# Patient Record
Sex: Male | Born: 1935
Health system: Southern US, Community
[De-identification: ages and names within clinical notes are randomized; demographics above are authoritative.]

## PROBLEM LIST (undated history)

## (undated) DIAGNOSIS — I10 Essential (primary) hypertension: Secondary | ICD-10-CM

## (undated) DIAGNOSIS — I341 Nonrheumatic mitral (valve) prolapse: Secondary | ICD-10-CM

## (undated) DIAGNOSIS — K648 Other hemorrhoids: Secondary | ICD-10-CM

## (undated) DIAGNOSIS — Z8679 Personal history of other diseases of the circulatory system: Secondary | ICD-10-CM

## (undated) DIAGNOSIS — E039 Hypothyroidism, unspecified: Secondary | ICD-10-CM

## (undated) DIAGNOSIS — J309 Allergic rhinitis, unspecified: Secondary | ICD-10-CM

## (undated) DIAGNOSIS — I2699 Other pulmonary embolism without acute cor pulmonale: Secondary | ICD-10-CM

## (undated) DIAGNOSIS — Z9289 Personal history of other medical treatment: Secondary | ICD-10-CM

## (undated) DIAGNOSIS — I700 Atherosclerosis of aorta: Secondary | ICD-10-CM

## (undated) DIAGNOSIS — H9192 Unspecified hearing loss, left ear: Secondary | ICD-10-CM

## (undated) DIAGNOSIS — N400 Enlarged prostate without lower urinary tract symptoms: Secondary | ICD-10-CM

## (undated) DIAGNOSIS — M21371 Foot drop, right foot: Secondary | ICD-10-CM

## (undated) DIAGNOSIS — H1010 Acute atopic conjunctivitis, unspecified eye: Secondary | ICD-10-CM

## (undated) DIAGNOSIS — Z9889 Other specified postprocedural states: Secondary | ICD-10-CM

## (undated) DIAGNOSIS — K219 Gastro-esophageal reflux disease without esophagitis: Secondary | ICD-10-CM

## (undated) DIAGNOSIS — D509 Iron deficiency anemia, unspecified: Secondary | ICD-10-CM

## (undated) DIAGNOSIS — K5730 Diverticulosis of large intestine without perforation or abscess without bleeding: Secondary | ICD-10-CM

## (undated) DIAGNOSIS — IMO0001 Reserved for inherently not codable concepts without codable children: Secondary | ICD-10-CM

## (undated) DIAGNOSIS — I519 Heart disease, unspecified: Secondary | ICD-10-CM

## (undated) DIAGNOSIS — I711 Thoracic aortic aneurysm, ruptured: Secondary | ICD-10-CM

## (undated) DIAGNOSIS — E079 Disorder of thyroid, unspecified: Secondary | ICD-10-CM

## (undated) HISTORY — DX: Hypothyroidism, unspecified: E03.9

## (undated) HISTORY — PX: TONSILLECTOMY: SUR1361

## (undated) HISTORY — DX: Diverticulosis of large intestine without perforation or abscess without bleeding: K57.30

## (undated) HISTORY — DX: Acute atopic conjunctivitis, unspecified eye: H10.10

## (undated) HISTORY — DX: Other specified postprocedural states: Z98.890

## (undated) HISTORY — DX: Other hemorrhoids: K64.8

## (undated) HISTORY — DX: Allergic rhinitis, unspecified: J30.9

## (undated) HISTORY — DX: Unspecified hearing loss, left ear: H91.92

## (undated) HISTORY — DX: Nonrheumatic mitral (valve) prolapse: I34.1

## (undated) HISTORY — DX: Atherosclerosis of aorta: I70.0

## (undated) HISTORY — DX: Iron deficiency anemia, unspecified: D50.9

## (undated) HISTORY — DX: Personal history of other medical treatment: Z92.89

## (undated) HISTORY — DX: Benign prostatic hyperplasia without lower urinary tract symptoms: N40.0

## (undated) HISTORY — PX: CATARACT EXTRACTION, BILATERAL: SHX1313

## (undated) HISTORY — DX: Gastro-esophageal reflux disease without esophagitis: K21.9

## (undated) HISTORY — DX: Essential (primary) hypertension: I10

## (undated) HISTORY — DX: Foot drop, right foot: M21.371

## (undated) HISTORY — DX: Personal history of other diseases of the circulatory system: Z86.79

## (undated) HISTORY — PX: ROTATOR CUFF REPAIR: SHX139

## (undated) HISTORY — PX: BACK SURGERY: SHX140

---

## 1970-09-01 HISTORY — PX: CERVICAL LAMINECTOMY: SHX94

## 2001-11-07 ENCOUNTER — Emergency Department: Admit: 2001-11-07 | Payer: Self-pay | Source: Emergency Department | Admitting: Emergency Medicine

## 2001-11-10 ENCOUNTER — Ambulatory Visit
Admit: 2001-11-10 | Disposition: A | Discharge status: Home / Self Care | Payer: Self-pay | Source: Ambulatory Visit | Admitting: Family Medicine

## 2001-11-25 ENCOUNTER — Ambulatory Visit
Admit: 2001-11-25 | Disposition: A | Discharge status: Home / Self Care | Payer: Self-pay | Source: Ambulatory Visit | Admitting: Family Medicine

## 2002-01-21 ENCOUNTER — Ambulatory Visit
Admit: 2002-01-21 | Disposition: A | Discharge status: Home / Self Care | Payer: Self-pay | Source: Ambulatory Visit | Admitting: Neurological Surgery

## 2002-01-27 ENCOUNTER — Inpatient Hospital Stay
Admission: RE | Admit: 2002-01-27 | Disposition: A | Discharge status: Home / Self Care | Payer: Self-pay | Source: Ambulatory Visit | Admitting: Neurological Surgery

## 2002-04-13 ENCOUNTER — Ambulatory Visit
Admit: 2002-04-13 | Disposition: A | Discharge status: Home / Self Care | Payer: Self-pay | Source: Ambulatory Visit | Admitting: Family Medicine

## 2004-09-01 HISTORY — PX: SPINE SURGERY: SHX786

## 2005-09-01 HISTORY — PX: PROSTATE SURGERY: SHX751

## 2007-07-05 ENCOUNTER — Encounter: Admission: RE | Admit: 2007-07-05 | Discharge: 2007-08-02 | Payer: Self-pay | Admitting: Family Medicine

## 2008-01-31 DIAGNOSIS — D509 Iron deficiency anemia, unspecified: Secondary | ICD-10-CM

## 2008-01-31 HISTORY — DX: Iron deficiency anemia, unspecified: D50.9

## 2008-02-21 ENCOUNTER — Encounter: Admission: RE | Admit: 2008-02-21 | Discharge: 2008-02-21 | Payer: Self-pay | Admitting: Family Medicine

## 2008-02-23 ENCOUNTER — Telehealth: Payer: Self-pay | Admitting: Internal Medicine

## 2008-03-02 ENCOUNTER — Ambulatory Visit: Payer: Self-pay | Admitting: Internal Medicine

## 2008-03-02 DIAGNOSIS — R0609 Other forms of dyspnea: Secondary | ICD-10-CM | POA: Insufficient documentation

## 2008-03-02 DIAGNOSIS — R0989 Other specified symptoms and signs involving the circulatory and respiratory systems: Secondary | ICD-10-CM

## 2008-03-02 DIAGNOSIS — I10 Essential (primary) hypertension: Secondary | ICD-10-CM | POA: Insufficient documentation

## 2008-04-24 ENCOUNTER — Encounter (INDEPENDENT_AMBULATORY_CARE_PROVIDER_SITE_OTHER): Payer: Self-pay | Admitting: *Deleted

## 2008-04-24 ENCOUNTER — Ambulatory Visit: Payer: Self-pay | Admitting: Internal Medicine

## 2008-04-26 LAB — CONVERTED CEMR LAB
Basophils Absolute: 0 10*3/uL (ref 0.0–0.1)
Calcium: 9.7 mg/dL (ref 8.4–10.5)
Creatinine, Ser: 1.2 mg/dL (ref 0.4–1.5)
GFR calc Af Amer: 77 mL/min
GFR calc non Af Amer: 63 mL/min
Glucose, Bld: 100 mg/dL — ABNORMAL HIGH (ref 70–99)
HCT: 37 % — ABNORMAL LOW (ref 39.0–52.0)
Hemoglobin: 12.3 g/dL — ABNORMAL LOW (ref 13.0–17.0)
Lymphocytes Relative: 32.8 % (ref 12.0–46.0)
MCHC: 33.1 g/dL (ref 30.0–36.0)
MCV: 77.2 fL — ABNORMAL LOW (ref 78.0–100.0)
Monocytes Absolute: 0.6 10*3/uL (ref 0.1–1.0)
Neutro Abs: 2.9 10*3/uL (ref 1.4–7.7)
Neutrophils Relative %: 53.5 % (ref 43.0–77.0)
Platelets: 382 10*3/uL (ref 150–400)
Potassium: 4.6 meq/L (ref 3.5–5.1)
RBC: 4.79 M/uL (ref 4.22–5.81)
RDW: 15.7 % — ABNORMAL HIGH (ref 11.5–14.6)
Sodium: 140 meq/L (ref 135–145)

## 2008-04-27 ENCOUNTER — Ambulatory Visit: Payer: Self-pay | Admitting: Internal Medicine

## 2008-04-27 ENCOUNTER — Telehealth (INDEPENDENT_AMBULATORY_CARE_PROVIDER_SITE_OTHER): Payer: Self-pay | Admitting: *Deleted

## 2008-04-27 LAB — CONVERTED CEMR LAB
Eosinophils Relative: 3.4 % (ref 0.0–5.0)
Iron: 48 ug/dL (ref 42–165)
MCV: 76.3 fL — ABNORMAL LOW (ref 78.0–100.0)
Neutro Abs: 1.4 10*3/uL (ref 1.4–7.7)
Neutrophils Relative %: 36.5 % — ABNORMAL LOW (ref 43.0–77.0)
Transferrin: 318.7 mg/dL (ref >=212.0)
WBC: 3.8 10*3/uL — ABNORMAL LOW (ref 4.5–10.5)

## 2008-05-02 ENCOUNTER — Ambulatory Visit: Payer: Self-pay | Admitting: Internal Medicine

## 2008-05-02 LAB — CONVERTED CEMR LAB
OCCULT 3: NEGATIVE
OCCULT 4: NEGATIVE
OCCULT 5: NEGATIVE

## 2008-05-22 ENCOUNTER — Ambulatory Visit (HOSPITAL_COMMUNITY): Admission: RE | Admit: 2008-05-22 | Discharge: 2008-05-22 | Payer: Self-pay | Admitting: Internal Medicine

## 2008-05-24 ENCOUNTER — Ambulatory Visit: Payer: Self-pay | Admitting: Internal Medicine

## 2008-05-26 ENCOUNTER — Ambulatory Visit: Payer: Self-pay | Admitting: Internal Medicine

## 2008-05-26 DIAGNOSIS — D649 Anemia, unspecified: Secondary | ICD-10-CM | POA: Insufficient documentation

## 2008-05-26 LAB — CONVERTED CEMR LAB
Eosinophils Absolute: 0.2 10*3/uL (ref 0.0–0.7)
HCT: 33.5 % — ABNORMAL LOW (ref 39.0–52.0)
Hemoglobin: 11.3 g/dL — ABNORMAL LOW (ref 13.0–17.0)
Lymphocytes Relative: 25.1 % (ref 12.0–46.0)
MCHC: 33.6 g/dL (ref 30.0–36.0)
MCV: 76.6 fL — ABNORMAL LOW (ref 78.0–100.0)
Monocytes Absolute: 0.6 10*3/uL (ref 0.1–1.0)
Neutro Abs: 3.2 10*3/uL (ref 1.4–7.7)
Neutrophils Relative %: 60.8 % (ref 43.0–77.0)
Platelets: 341 10*3/uL (ref 150–400)
RBC: 4.38 M/uL (ref 4.22–5.81)
WBC: 5.4 10*3/uL (ref 4.5–10.5)

## 2008-06-21 ENCOUNTER — Telehealth: Payer: Self-pay | Admitting: Gastroenterology

## 2008-06-23 ENCOUNTER — Telehealth: Payer: Self-pay | Admitting: Gastroenterology

## 2008-06-23 ENCOUNTER — Ambulatory Visit: Payer: Self-pay | Admitting: Gastroenterology

## 2008-06-23 LAB — CONVERTED CEMR LAB
Basophils Relative: 0.5 % (ref 0.0–3.0)
HCT: 31.3 % — ABNORMAL LOW (ref 39.0–52.0)
Lymphocytes Relative: 19.4 % (ref 12.0–46.0)
MCV: 75.3 fL — ABNORMAL LOW (ref 78.0–100.0)
Monocytes Relative: 9.1 % (ref 3.0–12.0)
Platelets: 314 10*3/uL (ref 150–400)
WBC: 5.5 10*3/uL (ref 4.5–10.5)

## 2008-06-28 LAB — CONVERTED CEMR LAB: Tissue Transglutaminase Ab, IgA: 0.8 units (ref <7)

## 2008-06-30 ENCOUNTER — Ambulatory Visit: Payer: Self-pay | Admitting: Gastroenterology

## 2008-07-03 ENCOUNTER — Telehealth (INDEPENDENT_AMBULATORY_CARE_PROVIDER_SITE_OTHER): Payer: Self-pay | Admitting: *Deleted

## 2008-07-03 LAB — CONVERTED CEMR LAB
OCCULT 1: NEGATIVE
OCCULT 5: NEGATIVE

## 2008-07-26 ENCOUNTER — Encounter: Payer: Self-pay | Admitting: Gastroenterology

## 2008-07-26 ENCOUNTER — Encounter: Admission: RE | Admit: 2008-07-26 | Discharge: 2008-07-26 | Payer: Self-pay | Admitting: Family Medicine

## 2008-08-01 DIAGNOSIS — K5730 Diverticulosis of large intestine without perforation or abscess without bleeding: Secondary | ICD-10-CM

## 2008-08-01 HISTORY — DX: Diverticulosis of large intestine without perforation or abscess without bleeding: K57.30

## 2008-08-02 ENCOUNTER — Telehealth (INDEPENDENT_AMBULATORY_CARE_PROVIDER_SITE_OTHER): Payer: Self-pay | Admitting: *Deleted

## 2008-08-02 ENCOUNTER — Ambulatory Visit: Payer: Self-pay | Admitting: Gastroenterology

## 2008-08-07 ENCOUNTER — Ambulatory Visit: Payer: Self-pay | Admitting: Gastroenterology

## 2008-08-07 LAB — HM COLONOSCOPY

## 2008-10-16 ENCOUNTER — Ambulatory Visit: Payer: Self-pay | Admitting: Hematology & Oncology

## 2008-10-17 ENCOUNTER — Ambulatory Visit: Payer: Self-pay | Admitting: Gastroenterology

## 2008-10-19 LAB — CONVERTED CEMR LAB
OCCULT 2: NEGATIVE
OCCULT 4: NEGATIVE

## 2008-10-24 ENCOUNTER — Ambulatory Visit: Payer: Self-pay | Admitting: Gastroenterology

## 2008-10-30 HISTORY — PX: ROTATOR CUFF REPAIR: SHX139

## 2008-10-31 ENCOUNTER — Encounter: Payer: Self-pay | Admitting: Gastroenterology

## 2008-10-31 ENCOUNTER — Ambulatory Visit: Payer: Self-pay | Admitting: Gastroenterology

## 2008-10-31 HISTORY — PX: ESOPHAGOGASTRODUODENOSCOPY: SHX1529

## 2008-11-06 ENCOUNTER — Encounter: Payer: Self-pay | Admitting: Gastroenterology

## 2008-11-06 ENCOUNTER — Telehealth (INDEPENDENT_AMBULATORY_CARE_PROVIDER_SITE_OTHER): Payer: Self-pay | Admitting: *Deleted

## 2008-11-06 ENCOUNTER — Encounter: Payer: Self-pay | Admitting: Internal Medicine

## 2008-11-06 LAB — CBC WITH DIFFERENTIAL (CANCER CENTER ONLY)
BASO%: 0.3 % (ref 0.0–2.0)
EOS%: 2.3 % (ref 0.0–7.0)
MCH: 22.4 pg — ABNORMAL LOW (ref 28.0–33.4)
MCHC: 31.6 g/dL — ABNORMAL LOW (ref 32.0–35.9)
MONO%: 7 % (ref 0.0–13.0)
NEUT#: 3.1 10*3/uL (ref 1.5–6.5)
Platelets: 354 10*3/uL (ref 145–400)
RDW: 15.9 % — ABNORMAL HIGH (ref 10.5–14.6)

## 2008-11-07 LAB — CHCC SATELLITE - SMEAR

## 2008-11-08 DIAGNOSIS — D500 Iron deficiency anemia secondary to blood loss (chronic): Secondary | ICD-10-CM | POA: Insufficient documentation

## 2008-11-08 LAB — RETICULOCYTES (CHCC): Retic Ct Pct: 0.8 % (ref 0.4–3.1)

## 2008-11-08 LAB — ERYTHROPOIETIN: Erythropoietin: 56.7 m[IU]/mL — ABNORMAL HIGH (ref 2.6–34.0)

## 2008-11-08 LAB — IRON AND TIBC: UIBC: 377 ug/dL

## 2008-11-13 ENCOUNTER — Telehealth: Payer: Self-pay | Admitting: Gastroenterology

## 2008-11-20 ENCOUNTER — Telehealth: Payer: Self-pay | Admitting: Gastroenterology

## 2008-11-29 ENCOUNTER — Ambulatory Visit: Payer: Self-pay | Admitting: Internal Medicine

## 2008-11-29 ENCOUNTER — Encounter: Payer: Self-pay | Admitting: Gastroenterology

## 2008-12-18 ENCOUNTER — Ambulatory Visit: Payer: Self-pay | Admitting: Hematology & Oncology

## 2008-12-19 LAB — FERRITIN: Ferritin: 132 ng/mL (ref 22–322)

## 2008-12-19 LAB — CBC WITH DIFFERENTIAL (CANCER CENTER ONLY)
BASO#: 0 10*3/uL (ref 0.0–0.2)
BASO%: 0.5 % (ref 0.0–2.0)
EOS%: 11.2 % — ABNORMAL HIGH (ref 0.0–7.0)
HCT: 39.3 % (ref 38.7–49.9)
HGB: 12.7 g/dL — ABNORMAL LOW (ref 13.0–17.1)
MCH: 25.5 pg — ABNORMAL LOW (ref 28.0–33.4)
MCHC: 32.3 g/dL (ref 32.0–35.9)
MONO%: 6.8 % (ref 0.0–13.0)
NEUT%: 43.9 % (ref 40.0–80.0)
RDW: 23.2 % — ABNORMAL HIGH (ref 10.5–14.6)

## 2008-12-19 LAB — RETICULOCYTES (CHCC)
RBC.: 4.93 MIL/uL (ref 4.22–5.81)
Retic Ct Pct: 0.8 % (ref 0.4–3.1)

## 2010-10-10 ENCOUNTER — Other Ambulatory Visit: Payer: Self-pay | Admitting: Dermatology

## 2011-01-22 ENCOUNTER — Encounter: Payer: Self-pay | Admitting: Family Medicine

## 2011-01-22 ENCOUNTER — Ambulatory Visit (INDEPENDENT_AMBULATORY_CARE_PROVIDER_SITE_OTHER): Payer: Medicare Other | Admitting: Family Medicine

## 2011-01-22 VITALS — BP 136/74 | HR 64 | Ht 64.0 in | Wt 138.0 lb

## 2011-01-22 DIAGNOSIS — I10 Essential (primary) hypertension: Secondary | ICD-10-CM

## 2011-01-22 DIAGNOSIS — E039 Hypothyroidism, unspecified: Secondary | ICD-10-CM

## 2011-01-22 DIAGNOSIS — D509 Iron deficiency anemia, unspecified: Secondary | ICD-10-CM

## 2011-01-22 DIAGNOSIS — K219 Gastro-esophageal reflux disease without esophagitis: Secondary | ICD-10-CM | POA: Insufficient documentation

## 2011-01-22 LAB — CBC WITH DIFFERENTIAL/PLATELET
Basophils Absolute: 0 10*3/uL (ref 0.0–0.1)
Eosinophils Absolute: 0.2 10*3/uL (ref 0.0–0.7)
Eosinophils Relative: 3 % (ref 0–5)
HCT: 41.3 % (ref 39.0–52.0)
Hemoglobin: 13.9 g/dL (ref 13.0–17.0)
Lymphs Abs: 2.2 10*3/uL (ref 0.7–4.0)
MCH: 27.9 pg (ref 26.0–34.0)
MCHC: 33.7 g/dL (ref 30.0–36.0)
Monocytes Absolute: 0.6 10*3/uL (ref 0.1–1.0)
Neutro Abs: 2.5 10*3/uL (ref 1.7–7.7)
Platelets: 298 10*3/uL (ref 150–400)
RDW: 14 % (ref 11.5–15.5)
WBC: 5.5 10*3/uL (ref 4.0–10.5)

## 2011-01-22 MED ORDER — OMEPRAZOLE 20 MG PO CPDR: 20.0000 mg | DELAYED_RELEASE_CAPSULE | Freq: Every day | ORAL | Status: DC

## 2011-01-22 NOTE — Progress Notes (Signed)
Subjective:    Patient ID: Danny Frederick, male    DOB: March 06, 1934, 77 y.o.   MRN: [id number]  HPI Patient presents to re-establish care, and follow up on his various medical problems:   Iron Deficiency Anemia: Stopped iron after last bloodwork in April, Hg was 14.1.  Prior Hg was also WNL.  Denies SOB and fatigue (the symptoms he initially had with his anemia.)  Denies any bleeding  Hypertension: Lisinopril-HCT dose was doubled back in March when BP's were running high.  This was too strong, felt woozy and BP's were low, so went back down to 1 tablet daily.  BP's running 125-135/70's.  Denies headaches, dizziness, swelling.  No chest pain, palpitations.  Hypothyroidism:  Compliant with taking thyroid medication daily.  Last TSH check was a year ago.  Denies fatigue, weight changes, bowel or skin changes.  "Runs out of gas" a little earlier in the day compared to when he was younger.  GERD:  He had cut back down on omeprazole to every other day when he was taking iron for anemia.  Has been off the iron, but continues the omeprazole every other day.  Denies any symptoms of reflux, dysphagia. In the past tried stopping omeprazole completely, but had recurrent reflux symptoms after off of it for a few days  H/o hemorrhoid surgery 40 years ago.  Having intermittent problems with internal hemorrhoids, with burning and itching.  Suppositories haven't helped and he would like to see a specialist for the hemorrhoids.  Sometimes gets temporary relief from creams, but he's looking for more of a permanent fix  Past Medical History  Diagnosis Date  . Hypertension   . Unspecified hypothyroidism   . Mitral valve prolapse   . Foot drop, right   . BPH (benign prostatic hypertrophy)   . Iron deficiency anemia, unspecified 6/09  . Hearing loss in left ear   . Diverticulosis of colon 12/09  . Internal hemorrhoids     Past Surgical History  Procedure Date  . Prostate surgery 2007    photovaporization    . Cervical laminectomy 1972    C5-6  . Spine surgery 2006    L4-5 disk surgery  . Rotator cuff repair 10/2008    left; Dr. Rennis Chris  . Esophagogastroduodenoscopy 10/31/08    normal; Dr. Gerilyn Pilgrim    History   Social History  . Marital Status: Married    Spouse Name: N/A    Number of Children: N/A  . Years of Education: N/A   Occupational History  . Not on file.   Social History Main Topics  . Smoking status: Former Smoker    Types: Cigarettes    Quit date: 09/01/1977  . Smokeless tobacco: Not on file  . Alcohol Use: Yes     1-2 glasses wine per day  . Drug Use: No  . Sexually Active: Not on file   Other Topics Concern  . Not on file   Social History Narrative  . No narrative on file    Family History  Problem Relation Age of Onset  . Diabetes Mother   . Heart disease Mother     CHF  . Stroke Father   . Parkinsonism Father   . Cancer Brother     bladder cancer  . Cancer Brother     prostate cancer    Current outpatient prescriptions:aspirin 81 MG tablet, Take 81 mg by mouth daily.  , Disp: , Rfl: ;  levothyroxine (SYNTHROID, LEVOTHROID) 50 MCG tablet, Take 50  mcg by mouth daily.  , Disp: , Rfl: ;  lisinopril-hydrochlorothiazide (PRINZIDE,ZESTORETIC) 20-12.5 MG per tablet, Take 1 tablet by mouth daily.  , Disp: , Rfl: ;  omeprazole (PRILOSEC) 20 MG capsule, Take 1 capsule (20 mg total) by mouth daily., Disp: 90 capsule, Rfl: 3 DISCONTD: omeprazole (PRILOSEC) 20 MG capsule, Take 20 mg by mouth daily.  , Disp: , Rfl:   No Known Allergies  Review of Systems Denies headaches, chest pain, palpitations, dizziness; occasionally has mild allergy symptoms; denies nausea, vomiting, reflux, dysphagia, change in bowel habits, urinary complaints, joint pains, skin concerns, anxiety/depression or other concerns.  No fevers, rashes    Objective:   Physical Exam  Well developed, well nourished patient, in no distress BP 136/74  Pulse 64  Ht 5\' 4"  (1.626 m)  Wt 138 lb (62.596  kg)  BMI 23.69 kg/m2 HEENT:  PERRL, EOMI, conjunctiva clear, OP clear (dentures) Neck: No lymphadenopathy or thyromegaly, no carotid bruit Heart:  Regular rate and rhythm, no murmurs, rubs, gallops or ectopy Lungs:  Clear bilaterally, without wheezes, rales or ronchi Abdomen:  Soft, nontender, nondistended, no hepatosplenomegaly or masses, normal bowel sounds Extremities:  No clubbing, cyanosis or edema, 2+ pulses, wearing R foot brace.  Neuro:  Alert and oriented x 3, cranial nerves grossly intact. Normal strength and sensation Back:  No spine or CVA tenderness Skin: no rashes or suspicious lesions, suntanned skin Psych:  Normal mood, affect, hygiene and grooming, normal speech, eye contact       Assessment & Plan:   1. Unspecified hypothyroidism  TSH, LEVOXYL 50 MCG tablet  2. Essential hypertension, benign     controlled  3. GERD (gastroesophageal reflux disease)     symptoms are well controlled on qod dosing.  Reviewed reflux precautions/diet, and will consider tapering off PPI to even less frequent  4. Iron deficiency anemia, unspecified  CBC with Differential, Ferritin, CBC, Ferritin   work-up has been negative (normal EGD and colonoscopy).  Anemia had resolved--due for re-check since being off iron  5. Hemorrhoids  Ambulatory referral to General Surgery   Internal.  Symptomatic.  Pt desires surgical evaluation for treatment

## 2011-01-23 ENCOUNTER — Encounter: Payer: Self-pay | Admitting: Family Medicine

## 2011-01-23 LAB — FERRITIN: Ferritin: 38 ng/mL (ref 22–322)

## 2011-01-23 LAB — TSH: TSH: 1.88 u[IU]/mL (ref 0.350–4.500)

## 2011-01-23 MED ORDER — LEVOXYL 50 MCG PO TABS: 50.0000 ug | ORAL_TABLET | Freq: Every day | ORAL | Status: DC

## 2011-01-29 ENCOUNTER — Encounter (INDEPENDENT_AMBULATORY_CARE_PROVIDER_SITE_OTHER): Payer: Self-pay | Admitting: General Surgery

## 2011-03-26 ENCOUNTER — Ambulatory Visit (INDEPENDENT_AMBULATORY_CARE_PROVIDER_SITE_OTHER): Payer: Medicare Other | Admitting: General Surgery

## 2011-03-26 ENCOUNTER — Encounter (INDEPENDENT_AMBULATORY_CARE_PROVIDER_SITE_OTHER): Payer: Self-pay | Admitting: General Surgery

## 2011-03-26 DIAGNOSIS — K648 Other hemorrhoids: Secondary | ICD-10-CM

## 2011-03-26 NOTE — Patient Instructions (Signed)
Keep up your good bowel habits.  Call us when you return from [place] to schedule hemorrhoidectomy

## 2011-03-27 ENCOUNTER — Encounter (INDEPENDENT_AMBULATORY_CARE_PROVIDER_SITE_OTHER): Payer: Self-pay | Admitting: General Surgery

## 2011-03-27 NOTE — Progress Notes (Signed)
Danny Frederick is a 77 y.o. male.    Chief Complaint  Patient presents with  . Other    post op recheck - hems    HPI HPI 77 year old male comes in for follow up regarding hemorrhoidal problems. I initially saw him on May 30. He states now he definitely notices some hemorrhoidal tissue prolapse out of his rectum when having a bowel movement however it spontaneously reduces. He reports continued daily bowel movements. He denies any diarrhea or constipation. He denies any melena or hematochezia. He is still taking 1 tablespoon of Metamucil a day. When the hemorrhoids pop out they do cause him discomfort. The itching that he initially had several months ago has dramatically improved.  Past Medical History  Diagnosis Date  . Hypertension   . Unspecified hypothyroidism   . Mitral valve prolapse   . Foot drop, right   . BPH (benign prostatic hypertrophy)   . Iron deficiency anemia, unspecified 6/09  . Hearing loss in left ear   . Diverticulosis of colon 12/09  . Internal hemorrhoids   . GERD (gastroesophageal reflux disease)     Past Surgical History  Procedure Date  . Prostate surgery 2007    photovaporization  . Cervical laminectomy 1972    C5-6  . Spine surgery 2006    L4-5 disk surgery  . Rotator cuff repair 10/2008    left; Dr. Rennis Chris  . Esophagogastroduodenoscopy 10/31/08    normal; Dr. Gerilyn Pilgrim    Family History  Problem Relation Age of Onset  . Diabetes Mother   . Heart disease Mother     CHF  . Stroke Father   . Parkinsonism Father   . Cancer Brother     bladder cancer  . Cancer Brother     prostate cancer  . Heart failure Mother   . Emphysema Brother     Social History History  Substance Use Topics  . Smoking status: Former Smoker    Types: Cigarettes    Quit date: 09/01/1977  . Smokeless tobacco: Not on file  . Alcohol Use: Yes     1-2 glasses wine per day    No Known Allergies  Current Outpatient Prescriptions  Medication Sig Dispense Refill  .  aspirin 81 MG tablet Take 81 mg by mouth daily.        Marland Kitchen LEVOXYL 50 MCG tablet Take 1 tablet (50 mcg total) by mouth daily.  90 tablet  3  . lisinopril-hydrochlorothiazide (PRINZIDE,ZESTORETIC) 20-12.5 MG per tablet Take 1 tablet by mouth daily.        Marland Kitchen omeprazole (PRILOSEC) 20 MG capsule Take 1 capsule (20 mg total) by mouth daily.  90 capsule  3    Review of Systems Review of Systems  Constitutional: Negative for fever, chills and weight loss.  HENT: Positive for hearing loss (b/l hearing aids). Negative for tinnitus.   Eyes: Negative for blurred vision.       +glasses  Respiratory: Negative for shortness of breath.   Cardiovascular: Negative for chest pain, palpitations, orthopnea and PND.  Gastrointestinal: Positive for heartburn. Negative for nausea, vomiting, abdominal pain, diarrhea, constipation and blood in stool.       See hpi  Genitourinary: Negative for dysuria and hematuria.       +nocturia  Musculoskeletal: Negative.   Skin: Negative.   Neurological: Negative.   Endo/Heme/Allergies: Does not bruise/bleed easily.  Psychiatric/Behavioral: Negative.   All other systems reviewed and are negative.    Physical Exam Physical Exam  Constitutional:  He is oriented to person, place, and time. He appears well-developed and well-nourished.  HENT:  Head: Normocephalic and atraumatic.  Eyes: Conjunctivae are normal. No scleral icterus.  Neck: No thyromegaly present.  Cardiovascular: Normal rate, regular rhythm and normal heart sounds.   Respiratory: Effort normal and breath sounds normal. No respiratory distress.  GI: Soft. Bowel sounds are normal. He exhibits no distension. There is no tenderness.  Genitourinary: Rectal exam shows internal hemorrhoid. Rectal exam shows no fissure and anal tone normal.       2 column grade II left lateral & right internal hemorrhoids  Musculoskeletal: Normal range of motion.  Lymphadenopathy:    He has no cervical adenopathy.  Neurological:  He is alert and oriented to person, place, and time.  Skin: Skin is warm and dry.  Psychiatric: He has a normal mood and affect. His behavior is normal. Judgment normal.     There were no vitals taken for this visit.  Data Reviewed: Office note from 01/29/11  Assessment/Plan 77 year old Caucasian male with hypertension, hypothyroidism, gastroesophageal reflux disease, BPH, and grade 2 (2 column) internal hemorrhoids.  At this point we discussed surgical options. I think he has exhausted all nonsurgical options. He is interested in proceeding with surgery. However he is going to [place] at the beginning of August. I have recommended that we delay surgery until after he returns from [place].  We discussed surgical options. He was given Agricultural engineer. I recommended an exam under anesthesia with excisional hemorrhoidectomy versus hemorrhoidal banding. We discussed the risk and benefits of the proposed procedure including but not limited to bleeding, injury to surrounding structures, hemorrhoidal recurrence, urinary retention, blood clot formation. I discussed the typical postoperative recovery course. I stressed to him the importance of not becoming constipated postoperatively. We did discuss the rare possibility of anal canal narrowing. I advised him that he would have to do several things in order to decrease his postoperative pain such as warm tub soaks, stool softeners, laxatives as needed, analgesics, and minimal postoperative narcotics.  He will contact us when he returns from [place] to schedule his surgery.  Gaynelle Adu M 03/27/2011, 3:05 PM

## 2011-05-29 ENCOUNTER — Telehealth (INDEPENDENT_AMBULATORY_CARE_PROVIDER_SITE_OTHER): Payer: Self-pay | Admitting: General Surgery

## 2011-05-29 NOTE — Telephone Encounter (Signed)
Patient called surgery scheduling ready to schedule surgery. Since it has been two months since he was seen last, Dr Andrey Campanile needs to see him in the office again prior to scheduling surgery. I called patient to tell him this and left message on machine to call back.

## 2011-05-30 NOTE — Telephone Encounter (Signed)
Called patient back and made him an appt to be re-examined prior to scheduling surgery.

## 2011-06-04 ENCOUNTER — Telehealth: Payer: Self-pay | Admitting: *Deleted

## 2011-06-04 ENCOUNTER — Other Ambulatory Visit: Payer: Self-pay | Admitting: *Deleted

## 2011-06-04 DIAGNOSIS — R5383 Other fatigue: Secondary | ICD-10-CM

## 2011-06-04 DIAGNOSIS — R29898 Other symptoms and signs involving the musculoskeletal system: Secondary | ICD-10-CM

## 2011-06-04 NOTE — Telephone Encounter (Signed)
Okay to add testosterone--you will need a diagnosis code though (can offer suggestions to him to avoid embarrassment)--?ED, fatigue. Diagnosis for CBC and ferritin is 285.9

## 2011-06-04 NOTE — Telephone Encounter (Signed)
Patient called and stated that he needed to have labs done prior to his 07/21/11 visit with you(CBC and FERRITIN). He wanted to know if you would "humor him" and add a testosterone to his labs and he will explain when he sees you. Please let me know. Thanks.

## 2011-06-13 ENCOUNTER — Encounter (INDEPENDENT_AMBULATORY_CARE_PROVIDER_SITE_OTHER): Payer: Self-pay | Admitting: General Surgery

## 2011-06-13 ENCOUNTER — Ambulatory Visit (INDEPENDENT_AMBULATORY_CARE_PROVIDER_SITE_OTHER): Payer: Medicare Other | Admitting: General Surgery

## 2011-06-13 VITALS — BP 112/76 | HR 84 | Temp 97.8°F | Resp 16 | Ht 64.0 in | Wt 137.0 lb

## 2011-06-13 DIAGNOSIS — L290 Pruritus ani: Secondary | ICD-10-CM

## 2011-06-13 DIAGNOSIS — K648 Other hemorrhoids: Secondary | ICD-10-CM

## 2011-06-13 NOTE — Progress Notes (Signed)
Danny Frederick is a 77 y.o. male.    Chief Complaint  Patient presents with  . Hemorrhoids    reck prior to surgery    HPI HPI 77 year old male comes in for follow up regarding hemorrhoidal problems. I initially saw him on May 30 and last saw him in July. He had a good trip to [place] in September.  He reports continued daily bowel movements. He denies any diarrhea or constipation. He denies any melena or hematochezia. He is still taking 1 tablespoon of Metamucil a day. The itching that he initially had several months ago has improved but it is the main thing that bothers him. It  will occur for 1-2 days at a time & then stop.  He denies any pain with defecation. He does drink 4 cups of tea/day.  He denies any incontinence. He is not a bathroom reader. He uses soap at the gym to clean his anal area while showering.   The only new change since his last visit is the development of some warts on his foot. He using aldara for it.  Past Medical History  Diagnosis Date  . Hypertension   . Unspecified hypothyroidism   . Mitral valve prolapse   . Foot drop, right   . BPH (benign prostatic hypertrophy)   . Iron deficiency anemia, unspecified 6/09  . Hearing loss in left ear   . Diverticulosis of colon 12/09  . Internal hemorrhoids   . GERD (gastroesophageal reflux disease)     Past Surgical History  Procedure Date  . Prostate surgery 2007    photovaporization  . Cervical laminectomy 1972    C5-6  . Spine surgery 2006    L4-5 disk surgery  . Rotator cuff repair 10/2008    left; Dr. Rennis Chris  . Esophagogastroduodenoscopy 10/31/08    normal; Dr. Gerilyn Pilgrim    Family History  Problem Relation Age of Onset  . Diabetes Mother   . Heart disease Mother     CHF  . Stroke Father   . Parkinsonism Father   . Cancer Brother     bladder cancer  . Cancer Brother     prostate cancer  . Heart failure Mother   . Emphysema Brother     Social History History  Substance Use Topics  . Smoking  status: Former Smoker    Types: Cigarettes    Quit date: 09/01/1977  . Smokeless tobacco: Not on file  . Alcohol Use: Yes     1-2 glasses wine per day    No Known Allergies  Current Outpatient Prescriptions  Medication Sig Dispense Refill  . aspirin 81 MG tablet Take 81 mg by mouth daily.        . imiquimod (ALDARA) 5 % cream       . LEVOXYL 50 MCG tablet Take 1 tablet (50 mcg total) by mouth daily.  90 tablet  3  . lisinopril-hydrochlorothiazide (PRINZIDE,ZESTORETIC) 20-12.5 MG per tablet Take 1 tablet by mouth daily.        Marland Kitchen omeprazole (PRILOSEC) 20 MG capsule Take 1 capsule (20 mg total) by mouth daily.  90 capsule  3    Review of Systems Review of Systems  Constitutional: Negative for fever, chills and weight loss.  HENT: Positive for hearing loss (b/l hearing aids). Negative for tinnitus.   Eyes: Negative for blurred vision.       +glasses  Respiratory: Negative for shortness of breath.   Cardiovascular: Negative for chest pain, palpitations, orthopnea and PND.  Gastrointestinal: Positive for heartburn. Negative for nausea, vomiting, abdominal pain, diarrhea, constipation and blood in stool.       See hpi  Genitourinary: Negative for dysuria and hematuria.       +nocturia  Musculoskeletal: Negative.   Skin: Negative.   Neurological: Negative.   Endo/Heme/Allergies: Does not bruise/bleed easily.  Psychiatric/Behavioral: Negative.   All other systems reviewed and are negative.    Physical Exam Physical Exam  Constitutional: He is oriented to person, place, and time. He appears well-developed and well-nourished.  HENT:  Head: Normocephalic and atraumatic.  Eyes: Conjunctivae are normal. No scleral icterus.  Neck: No thyromegaly present.  Cardiovascular: Normal rate, regular rhythm and normal heart sounds.   Respiratory: Effort normal and breath sounds normal. No respiratory distress.  GI: Soft. Bowel sounds are normal. He exhibits no distension. There is no  tenderness.  Genitourinary: Rectal exam shows internal hemorrhoid. Rectal exam shows no fissure and anal tone normal. There is very mild skin irritation around his anal verge extending for about 2cm in a circumferential manner. No skin fold thickening.      2 column grade II left lateral & right internal hemorrhoids  Musculoskeletal: Normal range of motion.  Lymphadenopathy:    He has no cervical adenopathy.  Neurological: He is alert and oriented to person, place, and time.  Skin: Skin is warm and dry.  Psychiatric: He has a normal mood and affect. His behavior is normal. Judgment normal.     Blood pressure 112/76, pulse 84, temperature 97.8 F (36.6 C), temperature source Temporal, resp. rate 16, height 5\' 4"  (1.626 m), weight 137 lb (62.143 kg).  Data Reviewed: Office note from 01/29/11  Assessment/Plan 77 year old Caucasian male with hypertension, hypothyroidism, gastroesophageal reflux disease, BPH, and grade 2 (2 column) internal hemorrhoids & some components of pruritis ani  His main complaint is itching. Therefore, I recommended we delay hemorrhoidal surgery as I can not guarantee that a hemorrhoidectomy/hemorrhodial banding will ameliorate his itching.  I recommended he work on perianal hygiene regarding pruritus ani such as eliminating caffeine, avoiding soap to that area, using baby wipes instead of toilet paper.  He was given Agricultural engineer.  F/u 6 weeks.   Mary Sella. Andrey Campanile, MD, FACS  Gaynelle Adu M 06/13/2011, 2:24 PM

## 2011-06-13 NOTE — Patient Instructions (Signed)
Anal Itching Itching around the anus is a common problem. It is usually a non-dangerous (benign) but bothersome condition. It often is caused by skin irritation from stool, moisture, soaps, or clothing. Other causes are pinworms, especially if the itching is worse at night. In adults, the itching may be due to hemorrhoids. In some cases, the cause is unknown. Itching usually can be controlled by keeping the anal area clean and dry. CAUSES  Loose or sticky stool from diarrhea or rectal leakage (fecal soilage).   Hemorrhoids. They allow stool to stick to the rectal area.   Certain foods. Be sure to discuss your diet with your caregiver.   Dry skin or skin diseases can occur at the anus.   Infections such as a local yeast infection or certain sexually transmitted diseases (STD's).   Worms (parasites).   Diseases of the anus. These include abscesses, fissures, fistulas or cancer.   Sometimes a cause cannot be found.  DIAGNOSIS  Your caregiver will take your history and examine you. A careful exam of the anus is important. Your caregiver will inspect the outer area of your anus and will do a rectal exam (feeling inside with a gloved finger).   Sometimes your caregiver will need to look inside the anus. This is a simple procedure that may be a little uncomfortable but usually does not require anesthesia.   If abnormalities are found, then a biopsy might be done or you may be referred to a specialist.  TREATMENT The treatment of your condition will depend on the cause.   Your caregiver will advise you on treatment of any disease found.   If you have rectal leakage or loose stools, a diet high in fiber or a fiber supplement should improve your condition.   You should avoid foods or substances that might be causing your itching.   Gentle care of your anal area is important to avoid worsening the irritation.  HOME CARE AND PREVENTIVE MEASURES:  Do not rub or scratch the area. This makes  the itching worse. It could worsen conditions such as parasite infections.   After every bowel movement and at bedtime, gently clean the anal area. Bathe or use moistened tissue or soft wash cloth. You also may use pre-moistened anal cleansing pads or tissues made for cleaning up babies. Do not use soap. Gently pat the area dry.   Wear underwear made of cotton or with a cotton crotch. Do not wear tight fitting clothes or underwear that keeps moisture in.   Avoid foods and beverages that may cause anal itching. Examples are beer, tea, coffee, milk, cola, tomatoes, citrus fruits, nuts, chocolate and spicy foods.   Be sure you have enough fiber in your diet.   Do not use products that may irritate the anal skin. These include perfumed or colored toilet paper, deodorant sprays and perfumed soaps.   Do not use any medication on the anal area unless advised. Some products may make itching worse.   It may take a few weeks for things to fully improve.  SEEK MEDICAL CARE IF:  The itching is not better in 3 to 4 days or is getting worse.   The skin around the anus becomes red or tender. This may be a sign of infection.   You have pain in the anus, especially with bowel movement.  SEEK IMMEDIATE CARE IF:   You have increasing pain in the anus or in the abdomen.   You have blood coming from the anus.  You have pus or other discharge from the anus.   You develop a temperature.  Document Released: 08/15/2000 Document Re-Released: 11/12/2009 [employer] Patient Information 2011 [place], [place].

## 2011-07-16 ENCOUNTER — Other Ambulatory Visit: Payer: Medicare Other

## 2011-07-16 DIAGNOSIS — R5383 Other fatigue: Secondary | ICD-10-CM

## 2011-07-16 DIAGNOSIS — D509 Iron deficiency anemia, unspecified: Secondary | ICD-10-CM

## 2011-07-16 DIAGNOSIS — R29898 Other symptoms and signs involving the musculoskeletal system: Secondary | ICD-10-CM

## 2011-07-17 ENCOUNTER — Encounter: Payer: Self-pay | Admitting: Family Medicine

## 2011-07-17 LAB — FERRITIN: Ferritin: 12 ng/mL — ABNORMAL LOW (ref 22–322)

## 2011-07-18 ENCOUNTER — Other Ambulatory Visit: Payer: Medicare Other

## 2011-07-18 ENCOUNTER — Other Ambulatory Visit: Payer: Self-pay | Admitting: Internal Medicine

## 2011-07-18 DIAGNOSIS — D649 Anemia, unspecified: Secondary | ICD-10-CM

## 2011-07-19 LAB — CBC
Hemoglobin: 12.9 g/dL — ABNORMAL LOW (ref 13.0–17.0)
MCH: 27.3 pg (ref 26.0–34.0)
MCHC: 32.7 g/dL (ref 30.0–36.0)
MCV: 83.7 fL (ref 78.0–100.0)

## 2011-07-21 ENCOUNTER — Encounter: Payer: Self-pay | Admitting: Family Medicine

## 2011-07-21 ENCOUNTER — Ambulatory Visit (INDEPENDENT_AMBULATORY_CARE_PROVIDER_SITE_OTHER): Payer: Medicare Other | Admitting: Family Medicine

## 2011-07-21 DIAGNOSIS — E039 Hypothyroidism, unspecified: Secondary | ICD-10-CM

## 2011-07-21 DIAGNOSIS — I10 Essential (primary) hypertension: Secondary | ICD-10-CM

## 2011-07-21 DIAGNOSIS — K219 Gastro-esophageal reflux disease without esophagitis: Secondary | ICD-10-CM

## 2011-07-21 DIAGNOSIS — D509 Iron deficiency anemia, unspecified: Secondary | ICD-10-CM

## 2011-07-21 DIAGNOSIS — Z125 Encounter for screening for malignant neoplasm of prostate: Secondary | ICD-10-CM

## 2011-07-21 NOTE — Patient Instructions (Signed)
Return in 3 months for nonfasting labs. Return in 6 months for fasting labs, and then office visit afterwards  Restart taking iron once daily

## 2011-07-21 NOTE — Progress Notes (Signed)
Patient presents for 6 month follow-up.  He had labs done prior to his appointment.  His ferritin levels have now dropped below normal, and hemoglobin dropped 1 point in the last 6 months, but still at 12.9.  Denies dyspnea, and is active at the gym 3-4 times a week.  Denies any bleeding from anywhere (including the internal hemorrhoids, which are symptomatic with itching, but not bleeding--has seen surgeon, treated with topical medications, and electing to avoid surgery for now).  Using Omeprazole every other day. Tried once to back down to every 3 days, but had recurrent reflux symptoms.  He had requested to have his testosterone level to be checked. He lost 10% of his bodyweight since he retired (in the last 15 years), and he feels like it was all muscle, but now has some rolls at his waist, and has lost a lot of the hair on his body.  Denies fatigue, although reports he can fall asleep quickly if he sits still.  Occasionally has some erectile dysfunction.  Reports normal libido.  Hypertension follow-up:  Blood pressures elsewhere are 103-146/68-91.  There is a lot of fluctuation, but mainly running 120-130's/70-80's.  Denies dizziness, headaches, chest pain.  Denies side effects of medications.  Hypothyroidism:  Compliant with taking medications.  Denies weight gain, bowels changes, fatigue.  Last TSH was 1.88 6 months ago  Past Medical History  Diagnosis Date  . Hypertension   . Unspecified hypothyroidism   . Mitral valve prolapse   . Foot drop, right   . BPH (benign prostatic hypertrophy)   . Iron deficiency anemia, unspecified 6/09  . Hearing loss in left ear   . Diverticulosis of colon 12/09  . Internal hemorrhoids   . GERD (gastroesophageal reflux disease)     Past Surgical History  Procedure Date  . Prostate surgery 2007    photovaporization  . Cervical laminectomy 1972    C5-6  . Spine surgery 2006    L4-5 disk surgery  . Rotator cuff repair 10/2008    left; Dr. Rennis Chris  .  Esophagogastroduodenoscopy 10/31/08    normal; Dr. Gerilyn Pilgrim    History   Social History  . Marital Status: Married    Spouse Name: N/A    Number of Children: N/A  . Years of Education: N/A   Occupational History  . Not on file.   Social History Main Topics  . Smoking status: Former Smoker    Types: Cigarettes    Quit date: 09/01/1977  . Smokeless tobacco: Not on file  . Alcohol Use: Yes     1-2 glasses wine per day  . Drug Use: No  . Sexually Active: Not on file   Other Topics Concern  . Not on file   Social History Narrative  . No narrative on file    Family History  Problem Relation Age of Onset  . Diabetes Mother   . Heart disease Mother     CHF  . Stroke Father   . Parkinsonism Father   . Cancer Brother     bladder cancer  . Cancer Brother     prostate cancer  . Heart failure Mother   . Emphysema Brother     Current outpatient prescriptions:aspirin 81 MG tablet, Take 81 mg by mouth daily.  , Disp: , Rfl: ;  imiquimod (ALDARA) 5 % cream, Apply 1 application topically every other day. , Disp: , Rfl: ;  LEVOXYL 50 MCG tablet, Take 1 tablet (50 mcg total) by mouth daily.,  Disp: 90 tablet, Rfl: 3;  lisinopril-hydrochlorothiazide (PRINZIDE,ZESTORETIC) 20-12.5 MG per tablet, Take 1 tablet by mouth daily.  , Disp: , Rfl:  omeprazole (PRILOSEC) 20 MG capsule, Take 1 capsule (20 mg total) by mouth daily., Disp: 90 capsule, Rfl: 3;  Salicylic Acid 27.5 % LIQD, Apply 1 application topically every other day.  , Disp: , Rfl:   No Known Allergies  ROS:  Denies fever, recent weight loss, any bleeding.  Denies dyspnea, chest pain, edema, headaches, dizziness, or other concerns.  See HPI  PHYSICAL EXAM: BP 118/68  Pulse 68  Ht 5\' 4"  (1.626 m)  Wt 138 lb 8 oz (62.823 kg)  BMI 23.77 kg/m2 Well developed, pleasant male in no distress HEENT: PERRL, EOMI, conjunctiva clear Neck: no lymphadenopathy, thyromegaly or carotid bruit Heart: regular rate and rhythm without  murmur Lungs: clear bilaterally with good air movement Abdomen: soft, nontender, no organomegaly or mass Extremities: no edema, normal sensation Skin: no rash or lesions Psych: normal mood, affect, hygiene and grooming  ASSESSMENT/PLAN:  1. Iron deficiency anemia, unspecified  CBC with Differential, Ferritin  2. Essential hypertension, benign  Comprehensive metabolic panel, Lipid panel  3. Unspecified hypothyroidism  TSH  4. Special screening for malignant neoplasm of prostate  PSA, Medicare  5. GERD (gastroesophageal reflux disease)     Anemia--only minimally anemic, but likely to worsen, given that iron stores have continued to drop since he's been off the iron supplement.  Given normal endoscopy and colonoscopy in the past, and the fact that he takes PPI regularly (qod), will recommend that he restart iron supplement daily.  Discussed either continuing the PPI qod vs changing to H2 blocker once to twice daily.  Given that his anemia responded to iron supplementation in the past, while on the omeprazole, elect to just restart iron supplement once daily.  HTN and GERD are well controlled with current medications  F/u 6 months with OV and labs prior (fasting), but to have CBC and ferritin repeat at lab-only visit (nonfasting) in 3 months

## 2011-07-30 ENCOUNTER — Ambulatory Visit (INDEPENDENT_AMBULATORY_CARE_PROVIDER_SITE_OTHER): Payer: Medicare Other | Admitting: General Surgery

## 2011-07-30 ENCOUNTER — Encounter (INDEPENDENT_AMBULATORY_CARE_PROVIDER_SITE_OTHER): Payer: Self-pay | Admitting: General Surgery

## 2011-07-30 VITALS — BP 96/68 | HR 72 | Resp 12 | Ht 64.0 in | Wt 137.0 lb

## 2011-07-30 DIAGNOSIS — L290 Pruritus ani: Secondary | ICD-10-CM

## 2011-07-30 NOTE — Progress Notes (Signed)
Morgan Rennert is a 77 y.o. male.    Chief Complaint  Patient presents with  . Follow-up    peri-rectal itching    HPI HPI 77 year old male comes in for follow up regarding hemorrhoidal and perianal itching problems. I last saw him in October. At That time, his main complaint was perianal itching. We instituted a different regimen for perianal hygiene such as avoiding toilet paper and  using baby wipes. The patient states that his itching has much improved. It rarely causes a problem now. He is still having a bowel movement per day. He is still using one tablespoon of fiber per day. He has been using baby wipes. He is very happy with the reduction in the amount of perianal itching.  Past Medical History  Diagnosis Date  . Hypertension   . Unspecified hypothyroidism   . Mitral valve prolapse   . Foot drop, right   . BPH (benign prostatic hypertrophy)   . Iron deficiency anemia, unspecified 6/09  . Hearing loss in left ear   . Diverticulosis of colon 12/09  . Internal hemorrhoids   . GERD (gastroesophageal reflux disease)     Past Surgical History  Procedure Date  . Prostate surgery 2007    photovaporization  . Cervical laminectomy 1972    C5-6  . Spine surgery 2006    L4-5 disk surgery  . Rotator cuff repair 10/2008    left; Dr. Rennis Chris  . Esophagogastroduodenoscopy 10/31/08    normal; Dr. Gerilyn Pilgrim    Family History  Problem Relation Age of Onset  . Diabetes Mother   . Heart disease Mother     CHF  . Stroke Father   . Parkinsonism Father   . Cancer Brother     bladder cancer  . Cancer Brother     prostate cancer  . Heart failure Mother   . Emphysema Brother     Social History History  Substance Use Topics  . Smoking status: Former Smoker    Types: Cigarettes    Quit date: 09/01/1977  . Smokeless tobacco: Not on file  . Alcohol Use: Yes     1-2 glasses wine per day    No Known Allergies  Current Outpatient Prescriptions  Medication Sig Dispense Refill  .  aspirin 81 MG tablet Take 81 mg by mouth daily.        . imiquimod (ALDARA) 5 % cream Apply 1 application topically every other day.       Marland Kitchen LEVOXYL 50 MCG tablet Take 1 tablet (50 mcg total) by mouth daily.  90 tablet  3  . lisinopril-hydrochlorothiazide (PRINZIDE,ZESTORETIC) 20-12.5 MG per tablet Take 1 tablet by mouth daily.        Marland Kitchen omeprazole (PRILOSEC) 20 MG capsule Take 1 capsule (20 mg total) by mouth daily.  90 capsule  3  . Salicylic Acid 27.5 % LIQD Apply 1 application topically every other day.          Review of Systems Review of Systems  Constitutional: Negative for fever, chills and weight loss.  HENT: Positive for hearing loss (b/l hearing aids). Negative for tinnitus.   Eyes: Negative for blurred vision.       +glasses  Respiratory: Negative for shortness of breath.   Cardiovascular: Negative for chest pain, palpitations, orthopnea and PND.  Gastrointestinal: Positive for heartburn. Negative for nausea, vomiting, abdominal pain, diarrhea, constipation and blood in stool.       See hpi  Genitourinary: Negative for dysuria and hematuria.       +  nocturia  Musculoskeletal: Negative.   Skin: Negative.   Neurological: Negative.   Endo/Heme/Allergies: Does not bruise/bleed easily.  Psychiatric/Behavioral: Negative.   All other systems reviewed and are negative.    Physical Exam Physical Exam  Constitutional: He is oriented to person, place, and time. He appears well-developed and well-nourished.  HENT:  Head: Normocephalic and atraumatic.  Eyes: Conjunctivae are normal. No scleral icterus.   GI: Soft. Bowel sounds are normal. He exhibits no distension. There is no tenderness.  Genitourinary: rectal exams shows no fissure, ext hemorrhoid, perianal skin irritation or changes. Skin: Skin is warm and dry.  Psychiatric: He has a normal mood and affect. His behavior is normal. Judgment normal.     Blood pressure 96/68, pulse 72, resp. rate 12, height 5\' 4"  (1.626 m),  weight 137 lb (62.143 kg).  Data Reviewed: Office note from 10/12  Assessment/Plan 77 year old Caucasian male with hypertension, hypothyroidism, gastroesophageal reflux disease, BPH, and grade 2 (2 column) internal hemorrhoids &  pruritis ani  He seems to have responded very well to the changes in his perianal hygiene. His main complaint of itching has significantly improved. Therefore I do not believe he will get much improvement by having a hemorrhoidectomy. He agrees with this plan.  I advised him to continue with daily fiber intake, drinking 6-8 glasses of water per day, and using the baby wipes. He will follow up as needed   Mary Sella. Andrey Campanile, MD, FACS  Gaynelle Adu M 07/30/2011, 9:36 AM

## 2011-07-30 NOTE — Patient Instructions (Signed)
Keep doing your current bowel regimen and using the baby wipes

## 2011-09-05 DIAGNOSIS — D367 Benign neoplasm of other specified sites: Secondary | ICD-10-CM | POA: Diagnosis not present

## 2011-09-30 ENCOUNTER — Telehealth: Payer: Self-pay | Admitting: Family Medicine

## 2011-10-01 ENCOUNTER — Other Ambulatory Visit: Payer: Self-pay | Admitting: *Deleted

## 2011-10-01 MED ORDER — LISINOPRIL-HYDROCHLOROTHIAZIDE 20-12.5 MG PO TABS: 1.0000 | ORAL_TABLET | Freq: Every day | ORAL | Status: DC

## 2011-10-02 NOTE — Telephone Encounter (Signed)
TSD  

## 2011-10-16 ENCOUNTER — Encounter: Payer: Self-pay | Admitting: Internal Medicine

## 2011-10-22 ENCOUNTER — Encounter: Payer: Self-pay | Admitting: Family Medicine

## 2011-10-22 ENCOUNTER — Other Ambulatory Visit: Payer: Medicare Other

## 2011-10-22 DIAGNOSIS — E039 Hypothyroidism, unspecified: Secondary | ICD-10-CM

## 2011-10-22 DIAGNOSIS — Z125 Encounter for screening for malignant neoplasm of prostate: Secondary | ICD-10-CM

## 2011-10-22 DIAGNOSIS — D509 Iron deficiency anemia, unspecified: Secondary | ICD-10-CM | POA: Diagnosis not present

## 2011-10-22 DIAGNOSIS — I10 Essential (primary) hypertension: Secondary | ICD-10-CM

## 2011-10-22 LAB — CBC WITH DIFFERENTIAL/PLATELET
Basophils Absolute: 0 K/uL (ref 0.0–0.1)
Basophils Relative: 0 % (ref 0–1)
Eosinophils Absolute: 0.1 K/uL (ref 0.0–0.7)
Eosinophils Relative: 2 % (ref 0–5)
HCT: 41.5 % (ref 39.0–52.0)
Hemoglobin: 13.7 g/dL (ref 13.0–17.0)
Lymphocytes Relative: 24 % (ref 12–46)
Lymphs Abs: 1.6 K/uL (ref 0.7–4.0)
MCH: 27.7 pg (ref 26.0–34.0)
MCHC: 33 g/dL (ref 30.0–36.0)
MCV: 84 fL (ref 78.0–100.0)
Monocytes Absolute: 0.6 K/uL (ref 0.1–1.0)
Monocytes Relative: 9 % (ref 3–12)
Neutro Abs: 4.3 K/uL (ref 1.7–7.7)
Neutrophils Relative %: 65 % (ref 43–77)
Platelets: 320 K/uL (ref 150–400)
RBC: 4.94 MIL/uL (ref 4.22–5.81)
RDW: 14.4 % (ref 11.5–15.5)
WBC: 6.6 K/uL (ref 4.0–10.5)

## 2011-10-23 ENCOUNTER — Encounter: Payer: Self-pay | Admitting: Family Medicine

## 2011-10-23 ENCOUNTER — Ambulatory Visit (INDEPENDENT_AMBULATORY_CARE_PROVIDER_SITE_OTHER): Payer: Medicare Other | Admitting: Family Medicine

## 2011-10-23 DIAGNOSIS — J309 Allergic rhinitis, unspecified: Secondary | ICD-10-CM | POA: Diagnosis not present

## 2011-10-23 DIAGNOSIS — J019 Acute sinusitis, unspecified: Secondary | ICD-10-CM

## 2011-10-23 DIAGNOSIS — D649 Anemia, unspecified: Secondary | ICD-10-CM | POA: Diagnosis not present

## 2011-10-23 MED ORDER — AMOXICILLIN 875 MG PO TABS: 875.0000 mg | ORAL_TABLET | Freq: Two times a day (BID) | ORAL | Status: AC

## 2011-10-23 MED ORDER — FLUTICASONE PROPIONATE 50 MCG/ACT NA SUSP: 2.0000 | Freq: Every day | NASAL | Status: DC

## 2011-10-23 NOTE — Progress Notes (Signed)
Chief complaint: sinus drainage and pain x 3 weeks  HPI: He thinks it started out as a regular cold, but has persistent congestion and drainage.  Gets out bloody and yellow colored mucus first thing in the morning.  Rest of the day continues to have runny nose, but mucus is clear or white, not discolored.  Denies sinus pain, but has a dry discomfort in sinuses at his cheeks.  Denies sore throat, cough or postnasal drip. Using a saline spray in the mornings.  Chronic runny nose x 2-3 years, seems better when he is outside of the home.  Yesterday he had house tested for mold.  He has known history of allergy to mold, and did weekly for allergy shots when he lived in [place].  They sprayed something in the ductwork to get rid of mold.  Had labs done yesterday, to f/u on anemia.  He is alternating iron and omeprazole.  Past Medical History  Diagnosis Date  . Hypertension   . Unspecified hypothyroidism   . Mitral valve prolapse   . Foot drop, right   . BPH (benign prostatic hypertrophy)   . Iron deficiency anemia, unspecified 6/09  . Hearing loss in left ear   . Diverticulosis of colon 12/09  . Internal hemorrhoids   . GERD (gastroesophageal reflux disease)     Past Surgical History  Procedure Date  . Prostate surgery 2007    photovaporization  . Cervical laminectomy 1972    C5-6  . Spine surgery 2006    L4-5 disk surgery  . Rotator cuff repair 10/2008    left; Dr. Rennis Chris  . Esophagogastroduodenoscopy 10/31/08    normal; Dr. Gerilyn Pilgrim    History   Social History  . Marital Status: Married    Spouse Name: N/A    Number of Children: N/A  . Years of Education: N/A   Occupational History  . Not on file.   Social History Main Topics  . Smoking status: Former Smoker    Types: Cigarettes    Quit date: 09/01/1977  . Smokeless tobacco: Not on file  . Alcohol Use: Yes     1-2 glasses wine per day  . Drug Use: No  . Sexually Active: Not on file   Other Topics Concern  . Not on  file   Social History Narrative  . No narrative on file    Family History  Problem Relation Age of Onset  . Diabetes Mother   . Heart disease Mother     CHF  . Stroke Father   . Parkinsonism Father   . Cancer Brother     bladder cancer  . Cancer Brother     prostate cancer  . Heart failure Mother   . Emphysema Brother    Current Outpatient Prescriptions on File Prior to Visit  Medication Sig Dispense Refill  . aspirin 81 MG tablet Take 81 mg by mouth daily.        . imiquimod (ALDARA) 5 % cream Apply 1 application topically every other day.       Marland Kitchen LEVOXYL 50 MCG tablet Take 1 tablet (50 mcg total) by mouth daily.  90 tablet  3  . lisinopril-hydrochlorothiazide (PRINZIDE,ZESTORETIC) 20-12.5 MG per tablet Take 1 tablet by mouth daily.  90 tablet  0  . omeprazole (PRILOSEC) 20 MG capsule Take 1 capsule (20 mg total) by mouth daily.  90 capsule  3  . Salicylic Acid 27.5 % LIQD Apply 1 application topically every other day.  No Known Allergies  ROS:  Denies fevers, headaches, dizziness, cough, shortness of breath, chest pain, skin rash, nausea, vomiting, diarrhea or other concerns.  Energy has been good  PHYSICAL EXAM: Well developed, pleasant male in no distress BP 132/78  Pulse 60  Temp(Src) 98 F (36.7 C) (Oral)  Ht 5\' 4"  (1.626 m)  Wt 137 lb (62.143 kg)  BMI 23.52 kg/m2 HEENT: Nasal mucosa mildly edematous, erythematous, with bloody and yellow-green mucus in L narea.  Sinuses nontender.  OP clear Neck: no lymphadenopathy or mass Heart: regular rate and rhythm without murmur Lungs: clear bilaterally Skin: no rash Psych: normal mood, affect, hygiene and grooming  Lab Results  Component Value Date   WBC 6.6 10/22/2011   HGB 13.7 10/22/2011   HCT 41.5 10/22/2011   MCV 84.0 10/22/2011   PLT 320 10/22/2011   Lab Results  Component Value Date   FERRITIN 23 10/22/2011   ASSESSMENT/PLAN: 1. Acute sinusitis  amoxicillin (AMOXIL) 875 MG tablet  2. Allergic  rhinitis, cause unspecified  fluticasone (FLONASE) 50 MCG/ACT nasal spray  3. ANEMIA     Possible sinusitis, in setting of recent cold, and possible allergies Treat with Amox.  Start flonase Sinus rinses once daily.  You may use saline nasal spray more frequently to keep nose moist and help with bleeding If flonase bothers you, you can try claritin or zyrtec in its place (over-the-counter anti-histamine once daily)  Blood test results reviewed--anemia has resolved, iron stores improving (low normal range).  Continue current regimen of alternating iron and omeprazole, plan to recheck in 6 months

## 2011-10-23 NOTE — Patient Instructions (Signed)
Sinus infection--Treat with Amoxacillin.  Start flonase--2 sprays each nostril once daily.  Do not aim the spray towards the septum.  You may cut down to once daily once symptoms are better.  Sinus rinses once daily.  You may use saline nasal spray more frequently to keep nose moist and help with bleeding If flonase bothers you, you can try claritin or zyrtec in its place (over-the-counter anti-histamine once daily)

## 2011-10-24 ENCOUNTER — Other Ambulatory Visit: Payer: Self-pay | Admitting: *Deleted

## 2011-10-24 DIAGNOSIS — I341 Nonrheumatic mitral (valve) prolapse: Secondary | ICD-10-CM

## 2011-10-24 DIAGNOSIS — R0600 Dyspnea, unspecified: Secondary | ICD-10-CM

## 2011-10-24 DIAGNOSIS — E039 Hypothyroidism, unspecified: Secondary | ICD-10-CM

## 2011-10-24 DIAGNOSIS — Z0000 Encounter for general adult medical examination without abnormal findings: Secondary | ICD-10-CM

## 2011-10-24 DIAGNOSIS — I10 Essential (primary) hypertension: Secondary | ICD-10-CM

## 2011-10-24 DIAGNOSIS — Z125 Encounter for screening for malignant neoplasm of prostate: Secondary | ICD-10-CM

## 2011-11-03 DIAGNOSIS — D367 Benign neoplasm of other specified sites: Secondary | ICD-10-CM | POA: Diagnosis not present

## 2011-12-04 DIAGNOSIS — J301 Allergic rhinitis due to pollen: Secondary | ICD-10-CM | POA: Diagnosis not present

## 2011-12-04 DIAGNOSIS — J3089 Other allergic rhinitis: Secondary | ICD-10-CM | POA: Diagnosis not present

## 2011-12-04 DIAGNOSIS — H1045 Other chronic allergic conjunctivitis: Secondary | ICD-10-CM | POA: Diagnosis not present

## 2011-12-05 DIAGNOSIS — J309 Allergic rhinitis, unspecified: Secondary | ICD-10-CM | POA: Diagnosis not present

## 2011-12-17 DIAGNOSIS — J309 Allergic rhinitis, unspecified: Secondary | ICD-10-CM | POA: Diagnosis not present

## 2011-12-19 DIAGNOSIS — J309 Allergic rhinitis, unspecified: Secondary | ICD-10-CM | POA: Diagnosis not present

## 2011-12-22 DIAGNOSIS — J309 Allergic rhinitis, unspecified: Secondary | ICD-10-CM | POA: Diagnosis not present

## 2011-12-24 DIAGNOSIS — J309 Allergic rhinitis, unspecified: Secondary | ICD-10-CM | POA: Diagnosis not present

## 2011-12-25 ENCOUNTER — Encounter: Payer: Self-pay | Admitting: Family Medicine

## 2011-12-25 ENCOUNTER — Ambulatory Visit (INDEPENDENT_AMBULATORY_CARE_PROVIDER_SITE_OTHER): Payer: Medicare Other | Admitting: Family Medicine

## 2011-12-25 VITALS — BP 142/84 | HR 72 | Ht 64.0 in | Wt 135.0 lb

## 2011-12-25 DIAGNOSIS — R0789 Other chest pain: Secondary | ICD-10-CM

## 2011-12-25 DIAGNOSIS — R55 Syncope and collapse: Secondary | ICD-10-CM | POA: Diagnosis not present

## 2011-12-25 DIAGNOSIS — E039 Hypothyroidism, unspecified: Secondary | ICD-10-CM | POA: Diagnosis not present

## 2011-12-25 DIAGNOSIS — Z125 Encounter for screening for malignant neoplasm of prostate: Secondary | ICD-10-CM | POA: Diagnosis not present

## 2011-12-25 DIAGNOSIS — I10 Essential (primary) hypertension: Secondary | ICD-10-CM | POA: Diagnosis not present

## 2011-12-25 LAB — CBC WITH DIFFERENTIAL/PLATELET
Basophils Absolute: 0 10*3/uL (ref 0.0–0.1)
Basophils Relative: 0 % (ref 0–1)
Eosinophils Absolute: 0.1 10*3/uL (ref 0.0–0.7)
Eosinophils Relative: 2 % (ref 0–5)
Lymphocytes Relative: 43 % (ref 12–46)
MCHC: 33.5 g/dL (ref 30.0–36.0)
MCV: 83.9 fL (ref 78.0–100.0)
Monocytes Absolute: 0.5 10*3/uL (ref 0.1–1.0)
Platelets: 311 10*3/uL (ref 150–400)
RDW: 13.8 % (ref 11.5–15.5)
WBC: 6 10*3/uL (ref 4.0–10.5)

## 2011-12-25 LAB — POCT URINALYSIS DIPSTICK
Blood, UA: NEGATIVE
Leukocytes, UA: NEGATIVE
Nitrite, UA: NEGATIVE
Protein, UA: NEGATIVE
pH, UA: 5

## 2011-12-25 LAB — COMPREHENSIVE METABOLIC PANEL
ALT: 22 U/L (ref 0–53)
AST: 37 U/L (ref 0–37)
CO2: 28 mEq/L (ref 19–32)
Chloride: 102 mEq/L (ref 96–112)
Sodium: 138 mEq/L (ref 135–145)
Total Bilirubin: 0.5 mg/dL (ref 0.3–1.2)
Total Protein: 8.4 g/dL — ABNORMAL HIGH (ref 6.0–8.3)

## 2011-12-25 LAB — TSH: TSH: 1.481 u[IU]/mL (ref 0.350–4.500)

## 2011-12-25 LAB — PSA, MEDICARE: PSA: 1.36 ng/mL (ref <=4.00)

## 2011-12-25 NOTE — Progress Notes (Signed)
Chief complaint: fell last night ~ 2am in the bathoom. Lost consciousness 2-3 minutes after fall. EMS did come to home but did not take patient to ER. Feeling okay today, but not 100%  HPI:  Got up to go to the bathroom, but once he was in the bathroom, he felt a little strange.  He voided, standing, but on way to sink to wash hands, he fell.  Hit his head on the corner of the countertop.  Was bleeding a lot, but able to control it with pressure.  Wife heard the bang of him falling, found him crawling on hands and knees, with a lot of blood on the floor, trying to get up.  Stated he was "drunk"; he seemed to be coherent, was worried about the blood getting into the grout and started cleaning it. She also noticed that he had small amount of fecal incontinence. When he lied back, he seemed to be less responsive, wife thought he looked dead, but he was fine by the time she came back to room to tell him she called 9-1-1.  EMT's came, and got the bleeding under control, and wrapped it up.  BP was fine, and all checked out well by the paramedics, so patient declined going to hospital.    Was feeling fine yesterday.  Usually up to the bathroom once or twice per night.  No known reason for him to be feeling ill--no nausea, vomiting, diarrhea, pain, or other concerns.  Doesn't feel 100% today--very slight tightness in the chest. Denies headache, nausea, vomiting.  Normal bowel movement today, no urinary problems today. Denies any palpitations, shortness of breath.  Past Medical History  Diagnosis Date  . Hypertension   . Unspecified hypothyroidism   . Mitral valve prolapse   . Foot drop, right   . BPH (benign prostatic hypertrophy)   . Iron deficiency anemia, unspecified 6/09  . Hearing loss in left ear   . Diverticulosis of colon 12/09  . Internal hemorrhoids   . GERD (gastroesophageal reflux disease)     Past Surgical History  Procedure Date  . Prostate surgery 2007    photovaporization  .  Cervical laminectomy 1972    C5-6  . Spine surgery 2006    L4-5 disk surgery  . Rotator cuff repair 10/2008    left; Dr. Rennis Chris  . Esophagogastroduodenoscopy 10/31/08    normal; Dr. Gerilyn Pilgrim    History   Social History  . Marital Status: Married    Spouse Name: N/A    Number of Children: N/A  . Years of Education: N/A   Occupational History  . Not on file.   Social History Main Topics  . Smoking status: Former Smoker    Types: Cigarettes    Quit date: 09/01/1977  . Smokeless tobacco: Not on file  . Alcohol Use: Yes     1-2 glasses wine per day  . Drug Use: No  . Sexually Active: Not on file   Other Topics Concern  . Not on file   Social History Narrative  . No narrative on file    Family History  Problem Relation Age of Onset  . Diabetes Mother   . Heart disease Mother     CHF  . Stroke Father   . Parkinsonism Father   . Cancer Brother     bladder cancer  . Cancer Brother     prostate cancer  . Heart failure Mother   . Emphysema Brother     Current outpatient  prescriptions:aspirin 81 MG tablet, Take 81 mg by mouth daily.  , Disp: , Rfl: ;  Ferrous Sulfate (IRON) 325 (65 FE) MG TABS, Take 1 tablet by mouth every other day., Disp: , Rfl: ;  fexofenadine (ALLEGRA) 180 MG tablet, Take 180 mg by mouth daily., Disp: , Rfl: ;  fluticasone (FLONASE) 50 MCG/ACT nasal spray, Place 2 sprays into the nose daily., Disp: 16 g, Rfl: 6 imiquimod (ALDARA) 5 % cream, Apply 1 application topically every other day. , Disp: , Rfl: ;  LEVOXYL 50 MCG tablet, Take 1 tablet (50 mcg total) by mouth daily., Disp: 90 tablet, Rfl: 3;  lisinopril-hydrochlorothiazide (PRINZIDE,ZESTORETIC) 20-12.5 MG per tablet, Take 1 tablet by mouth daily., Disp: 90 tablet, Rfl: 0;  omeprazole (PRILOSEC) 20 MG capsule, Take 1 capsule (20 mg total) by mouth daily., Disp: 90 capsule, Rfl: 3 Salicylic Acid 27.5 % LIQD, Apply 1 application topically every other day.  , Disp: , Rfl:   No Known Allergies  ROS: Pt  with h/o BPH, s/p PVP. +urinary urgency starting to recur.  Denies problems starting urine stream, no weakened stream. Denies dysuria.  Denies fevers. Denies fevers, URI symptoms, vertigo, bleeding.   PHYSICAL EXAM: BP 142/84  Pulse 72  Ht 5\' 4"  (1.626 m)  Wt 135 lb (61.236 kg)  BMI 23.17 kg/m2 Well developed, pleasant elderly man, in good spirits, in no distress. HEENT:  PERRL, EOMI, conjunctiva clear.  Scalp: 5.5 cm x 2mm width laceration across the back of his head.  No active bleeding.  No significant soft tissue swelling. Neck: no spinal tenderness. No lymphadenopathy or mass Heart: regular rate and rhythm, no murmur Lungs: clear bilaterally Abdomen: soft, nontender, no mass Extremities: no edema Psych: normal mood, affect, hygiene and grooming Neuro: alert and oriented.  Cranial nerves 2-12 intact. Normal strength, sensation, finger to nose, gait, and DTR's  U/a--normal EKG--no acute findings.  Poor RWP. No comparison EKG's available in old record  ASSESSMENT/PLAN: 1. Syncope  CBC with Differential, CT Head Wo Contrast, POCT Urinalysis Dipstick, PR ELECTROCARDIOGRAM, COMPLETE  2. Special screening for malignant neoplasm of prostate  PSA, Medicare  3. Unspecified hypothyroidism  TSH  4. Unspecified essential hypertension  Comprehensive metabolic panel  5. Chest pressure  PR ELECTROCARDIOGRAM, COMPLETE   Syncope--? Etiology.  Given LOC, will check head CT to r/o bleed.  May need further work-up, including cardiology work-up (?arrhythmia).  Will check labs that were due next week anyway (will still need to return fasting next week for lipids).  Wife advised of concerning signs/symptoms to return to ED if they develop.  Unable to get CT tonight, so will go in the morning, but to ER if any mental status changes, vomiting, etc.  F/u as previously scheduled. Will discuss prostate/urinary symptoms further at f/u. May need to f/u with urologist

## 2011-12-25 NOTE — Patient Instructions (Signed)
Head Injury, Adult  You have had a head injury that does not appear serious at this time. A concussion is a state of changed mental ability, usually from a blow to the head. You should take clear liquids for the rest of the day and then resume your regular diet. You should not take sedatives or alcoholic beverages for as long as directed by your caregiver after discharge. After injuries such as yours, most problems occur within the first 24 hours.  SYMPTOMS  These minor symptoms may be experienced after discharge:  · Memory difficulties.  · Dizziness.  · Headaches.  · Double vision.  · Hearing difficulties.  · Depression.  · Tiredness.  · Weakness.  · Difficulty with concentration.  If you experience any of these problems, you should not be alarmed. A concussion requires a few days for recovery. Many patients with head injuries frequently experience such symptoms. Usually, these problems disappear without medical care. If symptoms last for more than one day, notify your caregiver. See your caregiver sooner if symptoms are becoming worse rather than better.  HOME CARE INSTRUCTIONS   · During the next 24 hours you must stay with someone who can watch you for the warning signs listed below.  Although it is unlikely that serious side effects will occur, you should be aware of signs and symptoms which may necessitate your return to this location. Side effects may occur up to 7 - 10 days following the injury. It is important for you to carefully monitor your condition and contact your caregiver or seek immediate medical attention if there is a change in your condition.  SEEK IMMEDIATE MEDICAL CARE IF:   · There is confusion or drowsiness.  · You can not awaken the injured person.  · There is nausea (feeling sick to your stomach) or continued, forceful vomiting.  · You notice dizziness or unsteadiness which is getting worse, or inability to walk.  · You have convulsions or unconsciousness.  · You experience severe,  persistent headaches not relieved by over-the-counter or prescription medicines for pain. (Do not take aspirin as this impairs clotting abilities). Take other pain medications only as directed.  · You can not use arms or legs normally.  · There is clear or bloody discharge from the nose or ears.  MAKE SURE YOU:   · Understand these instructions.  · Will watch your condition.  · Will get help right away if you are not doing well or get worse.  Document Released: 08/18/2005 Document Revised: 08/07/2011 Document Reviewed: 07/06/2009  [employer]® Patient Information ©2012 [employer].

## 2011-12-26 ENCOUNTER — Ambulatory Visit
Admission: RE | Admit: 2011-12-26 | Discharge: 2011-12-26 | Disposition: A | Discharge status: Home / Self Care | Payer: Medicare Other | Source: Ambulatory Visit | Attending: Family Medicine | Admitting: Family Medicine

## 2011-12-26 DIAGNOSIS — R55 Syncope and collapse: Secondary | ICD-10-CM | POA: Diagnosis not present

## 2011-12-26 DIAGNOSIS — S0100XA Unspecified open wound of scalp, initial encounter: Secondary | ICD-10-CM | POA: Diagnosis not present

## 2011-12-29 ENCOUNTER — Telehealth: Payer: Self-pay | Admitting: Family Medicine

## 2011-12-29 DIAGNOSIS — J309 Allergic rhinitis, unspecified: Secondary | ICD-10-CM | POA: Diagnosis not present

## 2011-12-29 DIAGNOSIS — R55 Syncope and collapse: Secondary | ICD-10-CM

## 2011-12-29 NOTE — Telephone Encounter (Signed)
I agree with referral, as we discussed at visit

## 2011-12-31 DIAGNOSIS — J309 Allergic rhinitis, unspecified: Secondary | ICD-10-CM | POA: Diagnosis not present

## 2012-01-05 DIAGNOSIS — J309 Allergic rhinitis, unspecified: Secondary | ICD-10-CM | POA: Diagnosis not present

## 2012-01-05 DIAGNOSIS — D367 Benign neoplasm of other specified sites: Secondary | ICD-10-CM | POA: Diagnosis not present

## 2012-01-07 DIAGNOSIS — J309 Allergic rhinitis, unspecified: Secondary | ICD-10-CM | POA: Diagnosis not present

## 2012-01-09 ENCOUNTER — Other Ambulatory Visit: Payer: Medicare Other

## 2012-01-09 DIAGNOSIS — R069 Unspecified abnormalities of breathing: Secondary | ICD-10-CM | POA: Diagnosis not present

## 2012-01-09 DIAGNOSIS — Z0000 Encounter for general adult medical examination without abnormal findings: Secondary | ICD-10-CM

## 2012-01-09 DIAGNOSIS — I10 Essential (primary) hypertension: Secondary | ICD-10-CM

## 2012-01-09 DIAGNOSIS — R0600 Dyspnea, unspecified: Secondary | ICD-10-CM

## 2012-01-09 LAB — LIPID PANEL
LDL Cholesterol: 93 mg/dL (ref 0–99)
Total CHOL/HDL Ratio: 2.7 Ratio

## 2012-01-12 DIAGNOSIS — J309 Allergic rhinitis, unspecified: Secondary | ICD-10-CM | POA: Diagnosis not present

## 2012-01-14 ENCOUNTER — Ambulatory Visit (INDEPENDENT_AMBULATORY_CARE_PROVIDER_SITE_OTHER): Payer: Medicare Other | Admitting: Family Medicine

## 2012-01-14 ENCOUNTER — Encounter: Payer: Self-pay | Admitting: Family Medicine

## 2012-01-14 VITALS — BP 130/80 | HR 80 | Ht 64.0 in | Wt 136.0 lb

## 2012-01-14 DIAGNOSIS — Z125 Encounter for screening for malignant neoplasm of prostate: Secondary | ICD-10-CM

## 2012-01-14 DIAGNOSIS — K219 Gastro-esophageal reflux disease without esophagitis: Secondary | ICD-10-CM

## 2012-01-14 DIAGNOSIS — J309 Allergic rhinitis, unspecified: Secondary | ICD-10-CM | POA: Diagnosis not present

## 2012-01-14 DIAGNOSIS — I10 Essential (primary) hypertension: Secondary | ICD-10-CM

## 2012-01-14 DIAGNOSIS — Z0000 Encounter for general adult medical examination without abnormal findings: Secondary | ICD-10-CM

## 2012-01-14 DIAGNOSIS — Z1211 Encounter for screening for malignant neoplasm of colon: Secondary | ICD-10-CM | POA: Diagnosis not present

## 2012-01-14 DIAGNOSIS — E039 Hypothyroidism, unspecified: Secondary | ICD-10-CM

## 2012-01-14 LAB — POC HEMOCCULT BLD/STL (OFFICE/1-CARD/DIAGNOSTIC): Fecal Occult Blood, POC: NEGATIVE

## 2012-01-14 MED ORDER — LISINOPRIL-HYDROCHLOROTHIAZIDE 20-12.5 MG PO TABS: 1.0000 | ORAL_TABLET | Freq: Every day | ORAL | Status: DC

## 2012-01-14 MED ORDER — SYNTHROID 50 MCG PO TABS: 50.0000 ug | ORAL_TABLET | Freq: Every day | ORAL | Status: DC

## 2012-01-14 NOTE — Patient Instructions (Addendum)
Continue all current medications. Your screening tests, immunizations, etc are all up to date.  Follow up with Dr. Jens Som as scheduled regarding recent fainting spell.  All labs and physical exam are normal.  If you continue to check blood pressures regularly, and they all remain <135-140/85-90, then okay to just follow up once a year.  Return sooner if BP's creeping up, or any problems develop.,

## 2012-01-14 NOTE — Progress Notes (Signed)
Chief Complaint  Patient presents with  . Hypertension    6 month follow up, had lipid panel done here 01/09/12.   HPI:  Recent syncope--has upcoming appointment with Dr. Jens Som.  Denies any further dizziness or problems.  Sometimes he gets a strange sensation in his chest--described as a "tiny little pain", like a mild indigestion. Denies headaches, dizziness, vertigo.  Hypertension follow-up:  Blood pressures elsewhere are 120-130/70-80's.  Denies dizziness, headaches, chest pain.  Denies side effects of medications.  GERD: takes omeprazole every other day with good results.  Had recurrent symptoms if he went every 3 days.  He has h/o iron deficiency, and he continues to take iron every other day on the days he doesn't take the omeprazole.  Hypothyroidism: He got a letter from his pharmacy regarding the unavailability of Levoxyl.  Needs new rx.  He has not had a prostate exam this year.  He denies any prostate-related symptoms. Immunizations reviewed--UTD: Immunization History  Administered Date(s) Administered  . Influenza Split 06/13/2011  . Pneumococcal Conjugate 09/02/2003  . Tdap 10/02/2005  . Zoster 03/11/2005    Past Medical History  Diagnosis Date  . Hypertension   . Unspecified hypothyroidism   . Mitral valve prolapse   . Foot drop, right   . BPH (benign prostatic hypertrophy)   . Iron deficiency anemia, unspecified 6/09  . Hearing loss in left ear   . Diverticulosis of colon 12/09  . Internal hemorrhoids   . GERD (gastroesophageal reflux disease)    Past Surgical History  Procedure Date  . Prostate surgery 2007    photovaporization  . Cervical laminectomy 1972    C5-6  . Spine surgery 2006    L4-5 disk surgery  . Rotator cuff repair 10/2008    left; Dr. Rennis Chris  . Esophagogastroduodenoscopy 10/31/08    normal; Dr. Gerilyn Pilgrim   History   Social History  . Marital Status: Married    Spouse Name: N/A    Number of Children: N/A  . Years of Education: N/A    Occupational History  . Not on file.   Social History Main Topics  . Smoking status: Former Smoker    Types: Cigarettes    Quit date: 09/01/1977  . Smokeless tobacco: Not on file  . Alcohol Use: Yes     1-2 glasses wine per day  . Drug Use: No  . Sexually Active: Not on file   Other Topics Concern  . Not on file   Social History Narrative  . No narrative on file   Current Outpatient Prescriptions on File Prior to Visit  Medication Sig Dispense Refill  . aspirin 81 MG tablet Take 81 mg by mouth daily.        . Ferrous Sulfate (IRON) 325 (65 FE) MG TABS Take 1 tablet by mouth every other day.      . fexofenadine (ALLEGRA) 180 MG tablet Take 180 mg by mouth daily.      . fluticasone (FLONASE) 50 MCG/ACT nasal spray Place 2 sprays into the nose daily.  16 g  6  . imiquimod (ALDARA) 5 % cream Apply 1 application topically every other day.       Marland Kitchen omeprazole (PRILOSEC) 20 MG capsule Take 1 capsule (20 mg total) by mouth daily.  90 capsule  3  . Salicylic Acid 27.5 % LIQD Apply 1 application topically every other day.        Marland Kitchen DISCONTD: LEVOXYL 50 MCG tablet Take 1 tablet (50 mcg total) by  mouth daily.  90 tablet  3  . DISCONTD: lisinopril-hydrochlorothiazide (PRINZIDE,ZESTORETIC) 20-12.5 MG per tablet Take 1 tablet by mouth daily.  90 tablet  0   No Known Allergies  ROS:  Denies weight changes, chest pain, headaches, palpitations, shortness of breath.  Admits to having mild cold with head congestion.  Denies cough.  Denies GI changes, GU symptoms, joint pains, skin rashes.  Wound on scalp is healing without complications, drainage, fever.  Moods are good.  PHYSICAL EXAM: BP 130/80  Pulse 80  Ht 5\' 4"  (1.626 m)  Wt 136 lb (61.689 kg)  BMI 23.34 kg/m2 Well developed, pleasant elderly male in no distress HEENT: PERRL, EOMI, conjunctiva clear.  OP clear.  TM's and EACs normal Neck: no lymphadenopathy, thyromegaly or carotid bruit Heart: regular rate and rhythm without  murmur Lungs: clear bilaterally Abdomen: soft, nontender, no organomegaly or mass Extremities: no edema, 2+ pulse Psych: normal mood, affect, hygiene and grooming Neuro: alert and oriented x 3, cranial nerves intact, normal gait, sensation, strength Rectal exam: normal sphincter tone, no mass.  Heme negative stool. Prostate is normal in size, no mass, nodules, or tenderness or bogginess.     Lab Results  Component Value Date   CHOL 162 01/09/2012   HDL 60 01/09/2012   LDLCALC 93 01/09/2012   TRIG 47 01/09/2012   CHOLHDL 2.7 01/09/2012     Chemistry      Component Value Date/Time   NA 138 12/25/2011 1655   K 4.3 12/25/2011 1655   CL 102 12/25/2011 1655   CO2 28 12/25/2011 1655   BUN 29* 12/25/2011 1655   CREATININE 1.12 12/25/2011 1655   CREATININE 1.2 04/24/2008 1148      Component Value Date/Time   CALCIUM 10.4 12/25/2011 1655   ALKPHOS 67 12/25/2011 1655   AST 37 12/25/2011 1655   ALT 22 12/25/2011 1655   BILITOT 0.5 12/25/2011 1655     Lab Results  Component Value Date   TSH 1.481 12/25/2011   Lab Results  Component Value Date   PSA 1.36 12/25/2011   Normal depression questionnaire, no problems with ADL's--see screening questionnaire section in nurse assessments.  ASSESSMENT/PLAN: 1. Essential hypertension, benign  lisinopril-hydrochlorothiazide (PRINZIDE,ZESTORETIC) 20-12.5 MG per tablet  2. GERD (gastroesophageal reflux disease)    3. Unspecified hypothyroidism  SYNTHROID 50 MCG tablet  recent syncope--pending cardiac appointment. Health maintenance--all preventative measures reviewed.  Has had [facility] screening, reportedly normal in past.

## 2012-01-15 ENCOUNTER — Institutional Professional Consult (permissible substitution): Payer: Medicare Other | Admitting: Cardiovascular Disease

## 2012-01-19 DIAGNOSIS — J309 Allergic rhinitis, unspecified: Secondary | ICD-10-CM | POA: Diagnosis not present

## 2012-01-21 DIAGNOSIS — J309 Allergic rhinitis, unspecified: Secondary | ICD-10-CM | POA: Diagnosis not present

## 2012-01-22 ENCOUNTER — Encounter: Payer: Self-pay | Admitting: Cardiology

## 2012-01-22 ENCOUNTER — Ambulatory Visit (INDEPENDENT_AMBULATORY_CARE_PROVIDER_SITE_OTHER): Payer: Medicare Other | Admitting: Cardiology

## 2012-01-22 VITALS — BP 130/75 | HR 76 | Ht 64.0 in | Wt 135.0 lb

## 2012-01-22 DIAGNOSIS — R55 Syncope and collapse: Secondary | ICD-10-CM | POA: Diagnosis not present

## 2012-01-22 DIAGNOSIS — I10 Essential (primary) hypertension: Secondary | ICD-10-CM

## 2012-01-22 NOTE — Progress Notes (Signed)
HPI: 78 year old male with no prior cardiac history for evaluation of syncope. Patient had a syncopal episode on April 25. Laboratories reveal a potassium of 4.3, and BUN and creatinine of 29 and 1.12. Hemoglobin 13.7. Head CT showed scalp soft tissue injury but no fracture. Patient typically does not have dyspnea on exertion, orthopnea, PND, pedal edema, palpitations or exertional chest pain. On the morning of his event he went to the bathroom at approximately 2 AM. While he was urinating he developed dizziness and mild nausea. No chest pain, palpitations or dyspnea. He then had a frank syncopal episode striking his head. He was unconscious for 2-3 minutes. He has had no episodes since. Cardiology is now asked to evaluate.  Current Outpatient Prescriptions  Medication Sig Dispense Refill  . aspirin 81 MG tablet Take 81 mg by mouth daily.        . Ferrous Sulfate (IRON) 325 (65 FE) MG TABS Take 1 tablet by mouth every other day.      . fexofenadine (ALLEGRA) 180 MG tablet Take 180 mg by mouth daily.      . fluticasone (FLONASE) 50 MCG/ACT nasal spray Place 2 sprays into the nose daily.  16 g  6  . imiquimod (ALDARA) 5 % cream Apply 1 application topically every other day.       . lisinopril-hydrochlorothiazide (PRINZIDE,ZESTORETIC) 20-12.5 MG per tablet Take 1 tablet by mouth daily.  90 tablet  3  . omeprazole (PRILOSEC) 20 MG capsule Take 1 capsule (20 mg total) by mouth daily.  90 capsule  3  . SYNTHROID 50 MCG tablet Take 1 tablet (50 mcg total) by mouth daily.  90 tablet  3    No Known Allergies  Past Medical History  Diagnosis Date  . Hypertension   . Unspecified hypothyroidism   . Mitral valve prolapse   . Foot drop, right   . BPH (benign prostatic hypertrophy)   . Iron deficiency anemia, unspecified 6/09  . Hearing loss in left ear   . Diverticulosis of colon 12/09  . Internal hemorrhoids   . GERD (gastroesophageal reflux disease)     Past Surgical History  Procedure Date  .  Prostate surgery 2007    photovaporization  . Cervical laminectomy 1972    C5-6  . Spine surgery 2006    L4-5 disk surgery  . Rotator cuff repair 10/2008    left; Dr. Rennis Chris  . Esophagogastroduodenoscopy 10/31/08    normal; Dr. Gerilyn Pilgrim  . Tonsillectomy     History   Social History  . Marital Status: Married    Spouse Name: N/A    Number of Children: 2  . Years of Education: N/A   Occupational History  . Not on file.   Social History Main Topics  . Smoking status: Former Smoker    Types: Cigarettes    Quit date: 09/01/1977  . Smokeless tobacco: Not on file  . Alcohol Use: Yes     1-2 glasses wine per day  . Drug Use: No  . Sexually Active: Not on file   Other Topics Concern  . Not on file   Social History Narrative  . No narrative on file    Family History  Problem Relation Age of Onset  . Diabetes Mother   . Heart disease Mother     CHF  . Stroke Father   . Parkinsonism Father   . Cancer Brother     bladder cancer  . Cancer Brother     prostate cancer  .  Emphysema Brother     ROS:  no fevers or chills, productive cough, hemoptysis, dysphasia, odynophagia, melena, hematochezia, dysuria, hematuria, rash, seizure activity, orthopnea, PND, pedal edema, claudication. Remaining systems are negative.  Physical Exam:   Blood pressure 130/75, pulse 76, height 5\' 4"  (1.626 m), weight 61.236 kg (135 lb).  General:  Well developed/well nourished in NAD Skin warm/dry Patient not depressed No peripheral clubbing Back-normal HEENT-normal/normal eyelids Neck supple/normal carotid upstroke bilaterally; no bruits; no JVD; no thyromegaly chest - CTA/ normal expansion CV - RRR/normal S1 and S2; no murmurs, rubs or gallops;  PMI nondisplaced Abdomen -NT/ND, no HSM, no mass, + bowel sounds, no bruit 2+ femoral pulses, no bruits Ext-no edema, chords, 2+ DP Neuro-grossly nonfocal  ECG sinus rhythm at a rate of 76. Left anterior fascicular block. RV conduction delay. No  significant ST changes.

## 2012-01-22 NOTE — Assessment & Plan Note (Signed)
Blood pressure is controlled. Continue present medications. 

## 2012-01-22 NOTE — Patient Instructions (Signed)
Your physician recommends that you schedule a follow-up appointment in: 8-12 WEEKS WITH DR Jens Som  Your physician has requested that you have an echocardiogram. Echocardiography is a painless test that uses sound waves to create images of your heart. It provides your doctor with information about the size and shape of your heart and how well your heart's chambers and valves are working. This procedure takes approximately one hour. There are no restrictions for this procedure.   Your physician has recommended that you wear an event monitor. Event monitors are medical devices that record the heart's electrical activity. Doctors most often [place] these monitors to diagnose arrhythmias. Arrhythmias are problems with the speed or rhythm of the heartbeat. The monitor is a small, portable device. You can wear one while you do your normal daily activities. This is usually used to diagnose what is causing palpitations/syncope (passing out).

## 2012-01-22 NOTE — Assessment & Plan Note (Signed)
Patient's episode of syncope sounds to be micturition syncope. Plan echocardiogram to quantify LV function. We'll also plan event monitor.

## 2012-01-27 ENCOUNTER — Other Ambulatory Visit: Payer: Self-pay

## 2012-01-27 ENCOUNTER — Ambulatory Visit (HOSPITAL_COMMUNITY): Payer: Medicare Other | Attending: Cardiology

## 2012-01-27 ENCOUNTER — Encounter (INDEPENDENT_AMBULATORY_CARE_PROVIDER_SITE_OTHER): Payer: Medicare Other

## 2012-01-27 DIAGNOSIS — I059 Rheumatic mitral valve disease, unspecified: Secondary | ICD-10-CM | POA: Diagnosis not present

## 2012-01-27 DIAGNOSIS — R55 Syncope and collapse: Secondary | ICD-10-CM | POA: Diagnosis not present

## 2012-01-27 DIAGNOSIS — I10 Essential (primary) hypertension: Secondary | ICD-10-CM | POA: Insufficient documentation

## 2012-01-27 DIAGNOSIS — J309 Allergic rhinitis, unspecified: Secondary | ICD-10-CM | POA: Diagnosis not present

## 2012-01-27 DIAGNOSIS — I517 Cardiomegaly: Secondary | ICD-10-CM | POA: Insufficient documentation

## 2012-01-27 DIAGNOSIS — I519 Heart disease, unspecified: Secondary | ICD-10-CM | POA: Diagnosis not present

## 2012-01-27 DIAGNOSIS — Z87891 Personal history of nicotine dependence: Secondary | ICD-10-CM | POA: Insufficient documentation

## 2012-01-29 DIAGNOSIS — J309 Allergic rhinitis, unspecified: Secondary | ICD-10-CM | POA: Diagnosis not present

## 2012-02-04 DIAGNOSIS — J309 Allergic rhinitis, unspecified: Secondary | ICD-10-CM | POA: Diagnosis not present

## 2012-02-06 DIAGNOSIS — J309 Allergic rhinitis, unspecified: Secondary | ICD-10-CM | POA: Diagnosis not present

## 2012-02-11 DIAGNOSIS — J309 Allergic rhinitis, unspecified: Secondary | ICD-10-CM | POA: Diagnosis not present

## 2012-02-11 DIAGNOSIS — D367 Benign neoplasm of other specified sites: Secondary | ICD-10-CM | POA: Diagnosis not present

## 2012-02-12 ENCOUNTER — Telehealth: Payer: Self-pay | Admitting: Cardiology

## 2012-02-12 NOTE — Telephone Encounter (Signed)
Walk in Pt Form " Pt Dropped off [facility] [employer]" sent to Victorio Palm 02/12/12/KM

## 2012-02-17 DIAGNOSIS — J309 Allergic rhinitis, unspecified: Secondary | ICD-10-CM | POA: Diagnosis not present

## 2012-02-18 ENCOUNTER — Telehealth: Payer: Self-pay | Admitting: *Deleted

## 2012-02-18 NOTE — Telephone Encounter (Signed)
Left message for pt to call, monitor reviewed by dr crenshaw shows sinus with occ PAC and PVC 

## 2012-02-18 NOTE — Telephone Encounter (Signed)
Spoke with pt, aware of monitor results. 

## 2012-03-18 DIAGNOSIS — J309 Allergic rhinitis, unspecified: Secondary | ICD-10-CM | POA: Diagnosis not present

## 2012-03-19 DIAGNOSIS — D367 Benign neoplasm of other specified sites: Secondary | ICD-10-CM | POA: Diagnosis not present

## 2012-03-19 DIAGNOSIS — M79609 Pain in unspecified limb: Secondary | ICD-10-CM | POA: Diagnosis not present

## 2012-03-22 DIAGNOSIS — J309 Allergic rhinitis, unspecified: Secondary | ICD-10-CM | POA: Diagnosis not present

## 2012-03-24 DIAGNOSIS — J309 Allergic rhinitis, unspecified: Secondary | ICD-10-CM | POA: Diagnosis not present

## 2012-03-29 DIAGNOSIS — J309 Allergic rhinitis, unspecified: Secondary | ICD-10-CM | POA: Diagnosis not present

## 2012-03-30 ENCOUNTER — Ambulatory Visit (INDEPENDENT_AMBULATORY_CARE_PROVIDER_SITE_OTHER): Payer: Medicare Other | Admitting: Cardiology

## 2012-03-30 ENCOUNTER — Encounter: Payer: Self-pay | Admitting: Cardiology

## 2012-03-30 VITALS — BP 131/80 | HR 68 | Ht 64.0 in | Wt 134.0 lb

## 2012-03-30 DIAGNOSIS — I10 Essential (primary) hypertension: Secondary | ICD-10-CM

## 2012-03-30 DIAGNOSIS — R55 Syncope and collapse: Secondary | ICD-10-CM | POA: Diagnosis not present

## 2012-03-30 NOTE — Assessment & Plan Note (Signed)
Patient has had no further episodes. LV function normal. Monitor unremarkable. His event sounds to have been micturition syncope. We will not pursue further evaluation unless he has recurrent events.

## 2012-03-30 NOTE — Progress Notes (Signed)
HPI: Pleasant male with no prior cardiac history I initially saw in May of 2013 for evaluation of syncope. Patient had a syncopal episode on April 25. Laboratories reveal a potassium of 4.3, and BUN and creatinine of 29 and 1.12. Hemoglobin 13.7. Head CT showed scalp soft tissue injury but no fracture. We felt this syncopal episode may have been related to micturition syncope. Echocardiogram in May of 2013 showed normal LV function, grade 1 diastolic dysfunction and mild right atrial enlargement. [employer] monitor in May of 2013 showed sinus rhythm PAC and PVC. Since I last saw him, the patient denies any dyspnea on exertion, orthopnea, PND, pedal edema, palpitations, syncope or chest pain.    Current Outpatient Prescriptions  Medication Sig Dispense Refill  . aspirin 81 MG tablet Take 81 mg by mouth daily.        . Ferrous Sulfate (IRON) 325 (65 FE) MG TABS Take 1 tablet by mouth every other day.      . fexofenadine (ALLEGRA) 180 MG tablet Take 180 mg by mouth daily.      . fluticasone (FLONASE) 50 MCG/ACT nasal spray Place 2 sprays into the nose daily.  16 g  6  . imiquimod (ALDARA) 5 % cream Apply 1 application topically every other day.       . lisinopril-hydrochlorothiazide (PRINZIDE,ZESTORETIC) 20-12.5 MG per tablet Take 1 tablet by mouth daily.  90 tablet  3  . omeprazole (PRILOSEC) 20 MG capsule Take 1 capsule (20 mg total) by mouth daily.  90 capsule  3  . SYNTHROID 50 MCG tablet Take 1 tablet (50 mcg total) by mouth daily.  90 tablet  3     Past Medical History  Diagnosis Date  . Hypertension   . Unspecified hypothyroidism   . Mitral valve prolapse   . Foot drop, right   . BPH (benign prostatic hypertrophy)   . Iron deficiency anemia, unspecified 6/09  . Hearing loss in left ear   . Diverticulosis of colon 12/09  . Internal hemorrhoids   . GERD (gastroesophageal reflux disease)     Past Surgical History  Procedure Date  . Prostate surgery 2007    photovaporization  .  Cervical laminectomy 1972    C5-6  . Spine surgery 2006    L4-5 disk surgery  . Rotator cuff repair 10/2008    left; Dr. Rennis Chris  . Esophagogastroduodenoscopy 10/31/08    normal; Dr. Gerilyn Pilgrim  . Tonsillectomy     History   Social History  . Marital Status: Married    Spouse Name: N/A    Number of Children: 2  . Years of Education: N/A   Occupational History  . Not on file.   Social History Main Topics  . Smoking status: Former Smoker    Types: Cigarettes    Quit date: 09/01/1977  . Smokeless tobacco: Not on file  . Alcohol Use: Yes     1-2 glasses wine per day  . Drug Use: No  . Sexually Active: Not on file   Other Topics Concern  . Not on file   Social History Narrative  . No narrative on file    ROS: no fevers or chills, productive cough, hemoptysis, dysphasia, odynophagia, melena, hematochezia, dysuria, hematuria, rash, seizure activity, orthopnea, PND, pedal edema, claudication. Remaining systems are negative.  Physical Exam: Well-developed well-nourished in no acute distress.  Skin is warm and dry.  HEENT is normal.  Neck is supple.  Chest is clear to auscultation with normal expansion.  Cardiovascular exam is regular rate and rhythm.  Abdominal exam nontender or distended. No masses palpated. Extremities show no edema. neuro grossly intact  ECG sinus rhythm at a rate of 68. Left axis deviation. RV conduction delay. No ST changes.

## 2012-03-30 NOTE — Assessment & Plan Note (Signed)
Blood pressure controlled. Continue present medications. 

## 2012-03-30 NOTE — Patient Instructions (Addendum)
Your physician recommends that you schedule a follow-up appointment in: AS NEEDED  

## 2012-03-31 DIAGNOSIS — J309 Allergic rhinitis, unspecified: Secondary | ICD-10-CM | POA: Diagnosis not present

## 2012-04-02 DIAGNOSIS — D367 Benign neoplasm of other specified sites: Secondary | ICD-10-CM | POA: Diagnosis not present

## 2012-04-05 DIAGNOSIS — J309 Allergic rhinitis, unspecified: Secondary | ICD-10-CM | POA: Diagnosis not present

## 2012-04-07 DIAGNOSIS — J309 Allergic rhinitis, unspecified: Secondary | ICD-10-CM | POA: Diagnosis not present

## 2012-04-12 DIAGNOSIS — D367 Benign neoplasm of other specified sites: Secondary | ICD-10-CM | POA: Diagnosis not present

## 2012-04-12 DIAGNOSIS — M79609 Pain in unspecified limb: Secondary | ICD-10-CM | POA: Diagnosis not present

## 2012-04-13 DIAGNOSIS — J309 Allergic rhinitis, unspecified: Secondary | ICD-10-CM | POA: Diagnosis not present

## 2012-04-20 ENCOUNTER — Telehealth: Payer: Self-pay | Admitting: Internal Medicine

## 2012-04-20 MED ORDER — OMEPRAZOLE 20 MG PO CPDR: 20.0000 mg | DELAYED_RELEASE_CAPSULE | Freq: Every day | ORAL | Status: DC

## 2012-04-20 NOTE — Telephone Encounter (Signed)
Pt would like a refill for prilosec 20mg  #90 to [facility].Danny Frederick prilosec 20mg  to [facility] #90 no refills

## 2012-04-21 DIAGNOSIS — D367 Benign neoplasm of other specified sites: Secondary | ICD-10-CM | POA: Diagnosis not present

## 2012-04-21 DIAGNOSIS — J309 Allergic rhinitis, unspecified: Secondary | ICD-10-CM | POA: Diagnosis not present

## 2012-04-28 DIAGNOSIS — J309 Allergic rhinitis, unspecified: Secondary | ICD-10-CM | POA: Diagnosis not present

## 2012-05-05 DIAGNOSIS — H1045 Other chronic allergic conjunctivitis: Secondary | ICD-10-CM | POA: Diagnosis not present

## 2012-05-05 DIAGNOSIS — J301 Allergic rhinitis due to pollen: Secondary | ICD-10-CM | POA: Diagnosis not present

## 2012-05-05 DIAGNOSIS — J3089 Other allergic rhinitis: Secondary | ICD-10-CM | POA: Diagnosis not present

## 2012-05-05 DIAGNOSIS — J309 Allergic rhinitis, unspecified: Secondary | ICD-10-CM | POA: Diagnosis not present

## 2012-05-12 DIAGNOSIS — J309 Allergic rhinitis, unspecified: Secondary | ICD-10-CM | POA: Diagnosis not present

## 2012-05-19 DIAGNOSIS — D367 Benign neoplasm of other specified sites: Secondary | ICD-10-CM | POA: Diagnosis not present

## 2012-05-19 DIAGNOSIS — J309 Allergic rhinitis, unspecified: Secondary | ICD-10-CM | POA: Diagnosis not present

## 2012-05-26 DIAGNOSIS — J309 Allergic rhinitis, unspecified: Secondary | ICD-10-CM | POA: Diagnosis not present

## 2012-05-31 DIAGNOSIS — J309 Allergic rhinitis, unspecified: Secondary | ICD-10-CM | POA: Diagnosis not present

## 2012-06-01 DIAGNOSIS — J309 Allergic rhinitis, unspecified: Secondary | ICD-10-CM | POA: Diagnosis not present

## 2012-06-03 ENCOUNTER — Other Ambulatory Visit: Payer: Medicare Other

## 2012-06-04 ENCOUNTER — Other Ambulatory Visit: Payer: Medicare Other

## 2012-06-04 DIAGNOSIS — Z23 Encounter for immunization: Secondary | ICD-10-CM | POA: Diagnosis not present

## 2012-06-07 DIAGNOSIS — J309 Allergic rhinitis, unspecified: Secondary | ICD-10-CM | POA: Diagnosis not present

## 2012-06-14 DIAGNOSIS — D367 Benign neoplasm of other specified sites: Secondary | ICD-10-CM | POA: Diagnosis not present

## 2012-06-14 DIAGNOSIS — J309 Allergic rhinitis, unspecified: Secondary | ICD-10-CM | POA: Diagnosis not present

## 2012-06-16 DIAGNOSIS — J309 Allergic rhinitis, unspecified: Secondary | ICD-10-CM | POA: Diagnosis not present

## 2012-06-21 DIAGNOSIS — J309 Allergic rhinitis, unspecified: Secondary | ICD-10-CM | POA: Diagnosis not present

## 2012-06-23 DIAGNOSIS — J309 Allergic rhinitis, unspecified: Secondary | ICD-10-CM | POA: Diagnosis not present

## 2012-06-30 DIAGNOSIS — J309 Allergic rhinitis, unspecified: Secondary | ICD-10-CM | POA: Diagnosis not present

## 2012-06-30 DIAGNOSIS — B079 Viral wart, unspecified: Secondary | ICD-10-CM | POA: Diagnosis not present

## 2012-07-07 DIAGNOSIS — J309 Allergic rhinitis, unspecified: Secondary | ICD-10-CM | POA: Diagnosis not present

## 2012-07-14 DIAGNOSIS — J309 Allergic rhinitis, unspecified: Secondary | ICD-10-CM | POA: Diagnosis not present

## 2012-07-19 DIAGNOSIS — J309 Allergic rhinitis, unspecified: Secondary | ICD-10-CM | POA: Diagnosis not present

## 2012-07-27 DIAGNOSIS — J309 Allergic rhinitis, unspecified: Secondary | ICD-10-CM | POA: Diagnosis not present

## 2012-07-28 DIAGNOSIS — B079 Viral wart, unspecified: Secondary | ICD-10-CM | POA: Diagnosis not present

## 2012-08-03 DIAGNOSIS — J309 Allergic rhinitis, unspecified: Secondary | ICD-10-CM | POA: Diagnosis not present

## 2012-08-11 DIAGNOSIS — J309 Allergic rhinitis, unspecified: Secondary | ICD-10-CM | POA: Diagnosis not present

## 2012-08-11 DIAGNOSIS — B079 Viral wart, unspecified: Secondary | ICD-10-CM | POA: Diagnosis not present

## 2012-08-18 DIAGNOSIS — J309 Allergic rhinitis, unspecified: Secondary | ICD-10-CM | POA: Diagnosis not present

## 2012-08-23 NOTE — Op Note (Unsigned)
DATE:                                     01/27/2002            PREOPERATIVE DIAGNOSIS:                   Lumbar disk herniation, L4-5      right,                                                with radiculopathy.            POSTOPERATIVE DIAGNOSIS:                  Lumbar disk herniation, L4-5      right,                                                with radiculopathy.            OPERATION:                                Lumbar      laminectomy/microdiskectomy,                                                L4-5 right, for excision of      herniated                                                disk.            DESCRIPTION OF PROCEDURE:                 The patient was prepped and      draped in the usual sterile fashion for a lumbar laminectomy.  A linear      incision was taken down over the spinous processes of L4 and L5.  X-ray      localization was used.  A hemilaminotomy was performed and a wide      foraminotomy was carried out over the L5 nerve root.  The nerve root was      humped up over a large mound of disk material with a small fragment which      had migrated down over the body of L5.  This was excised in several pieces.      The nerve root was tightly stenosed in the foramen.  This was opened up      widely.  The interspace was entered multiple times to assure that there      were no other loose or free fragments.  The area was irrigated with copious      amounts of Bacitracin irrigation solution.            The fascia was closed with  interrupted Vicryl suture.  The subcutaneous      tissue was closed likewise and the skin was closed with Steri-Strips.                        DGH:2bm      Dictated:      01/27/2002  4:17 P _____________________________ ___________      Transcribed:   01/28/2002 11:21 A Graciella Belton, MD             Date Signed      Document Number: [id number]                  CC:   Graciella Belton, MD

## 2012-08-26 DIAGNOSIS — J309 Allergic rhinitis, unspecified: Secondary | ICD-10-CM | POA: Diagnosis not present

## 2012-08-27 DIAGNOSIS — B079 Viral wart, unspecified: Secondary | ICD-10-CM | POA: Diagnosis not present

## 2012-08-30 DIAGNOSIS — J309 Allergic rhinitis, unspecified: Secondary | ICD-10-CM | POA: Diagnosis not present

## 2012-09-08 DIAGNOSIS — J309 Allergic rhinitis, unspecified: Secondary | ICD-10-CM | POA: Diagnosis not present

## 2012-09-15 DIAGNOSIS — J309 Allergic rhinitis, unspecified: Secondary | ICD-10-CM | POA: Diagnosis not present

## 2012-09-22 DIAGNOSIS — J309 Allergic rhinitis, unspecified: Secondary | ICD-10-CM | POA: Diagnosis not present

## 2012-09-23 ENCOUNTER — Encounter (INDEPENDENT_AMBULATORY_CARE_PROVIDER_SITE_OTHER): Payer: Self-pay | Admitting: General Surgery

## 2012-09-23 ENCOUNTER — Ambulatory Visit (INDEPENDENT_AMBULATORY_CARE_PROVIDER_SITE_OTHER): Payer: Medicare Other | Admitting: General Surgery

## 2012-09-23 VITALS — BP 150/100 | HR 72 | Resp 18 | Ht 64.0 in | Wt 138.0 lb

## 2012-09-23 DIAGNOSIS — K648 Other hemorrhoids: Secondary | ICD-10-CM | POA: Insufficient documentation

## 2012-09-23 NOTE — Progress Notes (Signed)
Patient ID: Danny Frederick, male   DOB: 06/27/1934, 79 y.o.   MRN: [id number]  Chief Complaint  Patient presents with  . Hemorrhoids    HPI Danny Frederick is a 79 y.o. male.   HPI 79 year old Caucasian male comes in for long-term followup regarding his hemorrhoids. I last saw him in the office in November 2012. At that time his main issue was perianal itching and burning. After we instituted measures for his perianal itching he had a significant reduction in the amount of itching. At that time we agreed to keep an eye on his hemorrhoidal problem. He states that he still has some occasional itching. He now notices a bulge when he has a bowel movement. He states he is having a bowel movement on a daily basis. He denies any straining. He denies any melena or hematochezia. He denies any stool caliber changes. He denies any incontinence. He is not sitting on the commode for prolonged period of time. He is drinking plenty of water and taking supplemental fiber.  He denies any significant changes to his medical history since he was last seen. I reviewed colonoscopy report from 2009 that showed diverticulosis and internal hemorrhoids Past Medical History  Diagnosis Date  . Hypertension   . Unspecified hypothyroidism   . Mitral valve prolapse   . Foot drop, right   . BPH (benign prostatic hypertrophy)   . Iron deficiency anemia, unspecified 6/09  . Hearing loss in left ear   . Diverticulosis of colon 12/09  . Internal hemorrhoids   . GERD (gastroesophageal reflux disease)     Past Surgical History  Procedure Date  . Prostate surgery 2007    photovaporization  . Cervical laminectomy 1972    C5-6  . Spine surgery 2006    L4-5 disk surgery  . Rotator cuff repair 10/2008    left; Dr. Rennis Chris  . Esophagogastroduodenoscopy 10/31/08    normal; Dr. Gerilyn Pilgrim  . Tonsillectomy     Family History  Problem Relation Age of Onset  . Diabetes Mother   . Heart disease Mother     CHF  . Stroke Father     . Parkinsonism Father   . Cancer Brother     bladder cancer  . Cancer Brother     prostate cancer  . Emphysema Brother     Social History History  Substance Use Topics  . Smoking status: Former Smoker    Types: Cigarettes    Quit date: 09/01/1977  . Smokeless tobacco: Not on file  . Alcohol Use: Yes     Comment: 1-2 glasses wine per day    No Known Allergies  Current Outpatient Prescriptions  Medication Sig Dispense Refill  . aspirin 81 MG tablet Take 81 mg by mouth daily.        . Ferrous Sulfate (IRON) 325 (65 FE) MG TABS Take 1 tablet by mouth every other day.      . fexofenadine (ALLEGRA) 180 MG tablet Take 180 mg by mouth daily.      . fluticasone (FLONASE) 50 MCG/ACT nasal spray Place 2 sprays into the nose daily.  16 g  6  . imiquimod (ALDARA) 5 % cream Apply 1 application topically every other day.       . lisinopril-hydrochlorothiazide (PRINZIDE,ZESTORETIC) 20-12.5 MG per tablet Take 1 tablet by mouth daily.  90 tablet  3  . omeprazole (PRILOSEC) 20 MG capsule Take 1 capsule (20 mg total) by mouth daily.  90 capsule  0  . SYNTHROID  50 MCG tablet Take 1 tablet (50 mcg total) by mouth daily.  90 tablet  3    Review of Systems Review of Systems  Constitutional: Negative for fever, chills, appetite change and unexpected weight change.  HENT: Negative for congestion and trouble swallowing.   Eyes: Negative for visual disturbance.  Respiratory: Negative for chest tightness and shortness of breath.   Cardiovascular: Negative for chest pain and leg swelling.       No PND, no orthopnea, no DOE  Gastrointestinal:       See HPI  Genitourinary: Negative for dysuria and hematuria.       Nocturia; denies weak stream, dribbling, hesistancy  Musculoskeletal: Negative.   Skin: Negative for rash.  Neurological: Negative for seizures and speech difficulty.  Hematological: Does not bruise/bleed easily.  Psychiatric/Behavioral: Negative for behavioral problems and confusion.     Blood pressure 150/100, pulse 72, resp. rate 18, height 5\' 4"  (1.626 m), weight 138 lb (62.596 kg).  Physical Exam Physical Exam  Vitals reviewed. Constitutional: He is oriented to person, place, and time. He appears well-developed and well-nourished. No distress.  HENT:  Head: Normocephalic and atraumatic.  Right Ear: External ear normal.  Left Ear: External ear normal.  Eyes: Conjunctivae normal are normal. No scleral icterus.  Neck: Neck supple. No tracheal deviation present. No thyromegaly present.  Cardiovascular: Normal rate, regular rhythm and normal heart sounds.   Pulmonary/Chest: Effort normal and breath sounds normal. No respiratory distress. He has no wheezes.  Abdominal: Soft. He exhibits no distension. There is no tenderness. There is no rebound.  Genitourinary: Rectal exam shows no external hemorrhoid, no fissure and anal tone normal.       dre - good tone Anoscopy - rt ant/post small grade 1 int hemorrhoids, Left post larger grade 1 int hemorrhoid; small left ant int hemorrhoid  Musculoskeletal: He exhibits no edema and no tenderness.  Neurological: He is alert and oriented to person, place, and time. He exhibits normal muscle tone.  Skin: Skin is warm and dry. No rash noted. He is not diaphoretic. No erythema.  Psychiatric: He has a normal mood and affect. His behavior is normal. Judgment and thought content normal.    Data Reviewed My last note  Assessment    Internal hemorrhoids - 3 column    Plan    We rediscussed hemorrhoidal disease.  The patient is at the point where he would like something done for his internal hemorrhoids. I believe the majority of his hemorrhoids are amenable to hemorrhoidal banding however the left posterior hemorrhoid may require excisional hemorrhoidectomy and this was explained to him.  I discussed the procedure in detail.  The patient was given Agricultural engineer.  We discussed the risks and benefits of surgery including,  but not limited to bleeding, infection, blood clot formation, anesthesia risk, urinary retention, hemorrhoid recurrence, injury to the sphincters resulting in incontinence, and the rare possibility of anal canal narrowing. I explained that the likelihood of improvement of their symptoms is good  We discussed the typical postoperative course.  I stressed the importance of not becoming constipated after surgery.  The patient was encouraged to limit pain medication if possible as this increases the likelihood of becoming constipated. The patient was advised to take stool softners & drink 8-10 glasses of non-carbonated, non-alcoholic beverages per day and to eat a high fiber diet.  I also encouraged soaking in a water warm bath for 15 minutes at a time several times a day and after  a bowel movement.  The patient was advised to take laxatives such as milk of magnesia or Miralax if no bowel movement three days after surgery.  The patient was advised to expect some blood tinged drainage as well as some blood in their bowel movements.   The patient has elected to proceed with an exam under anesthesia, hemorrhoidal banding with possible excisional hemorrhoidectomy. He will be scheduled in the near future  Laporsche Hoeger M. Andrey Campanile, MD, FACS General, Bariatric, & Minimally Invasive Surgery [facility], [place]         [facility] M 09/23/2012, 12:08 PM

## 2012-09-23 NOTE — Patient Instructions (Signed)
Hemorrhoid Banding Hemorrhoids are veins in the anus and lower rectum that become enlarged. The most common symptoms are rectal bleeding, itching, and sometimes pain. Hemorrhoids might come out with straining or having a bowel movement, and they can sometimes be pushed back in. There are internal and external hemorrhoids. Only internal hemorrhoids can be treated with banding. In this procedure, a rubber band is placed near the hemorrhoid tissue, cutting off the blood supply. This procedure prevents the hemorrhoids from slipping down. LET YOUR CAREGIVER KNOW ABOUT: All medicines you are taking, especially blood thinners such as aspirin and coumadin.  RISKS AND COMPLICATIONS This is not a painful procedure, but if you do have intense pain immediately let your surgeon know because the band may need to be removed. You may have some mild pain or discomfort in the first 2 days or so after treatment. Sometimes there may be delayed bleeding in the first week after treatment.  BEFORE THE PROCEDURE  There is no special preparation needed before banding. Your surgeon may have you do an enema prior to the procedure. You will go home the same day.  HOME CARE INSTRUCTIONS   Your surgeon might instruct you to do sitz baths as needed if you have discomfort or after a bowel movement.  You may be instructed to use fiber supplements. SEEK MEDICAL CARE IF:  You have an increase in pain.  Your pain does not get better. SEEK IMMEDIATE MEDICAL CARE IF:  You have intense pain.  Fever greater than 100.5 F (38.1 C).  Bleeding that does not stop, or pus from the anus. Document Released: 06/15/2009 Document Revised: 11/10/2011 Document Reviewed: 06/15/2009 [facility] Patient Information 2013 [place], [place].  Hemorrhoidectomy Hemorrhoidectomy is surgery to remove hemorrhoids. Hemorrhoids are veins that have become swollen in the rectum. The rectum is the area from the bottom end of the intestines to the opening  where bowel movements leave the body. Hemorrhoids can be uncomfortable. They can cause itching, bleeding and pain if a blood clot forms in them (thrombose). If hemorrhoids are small, surgery may not be needed. But if they cover a larger area, surgery is usually suggested.  LET YOUR CAREGIVER KNOW ABOUT:   Any allergies.  All medications you are taking, including:  Herbs, eyedrops, over-the-counter medications and creams.  Blood thinners (anticoagulants), aspirin or other drugs that could affect blood clotting.  Use of steroids (by mouth or as creams).  Previous problems with anesthetics, including local anesthetics.  Possibility of pregnancy, if this applies.  Any history of blood clots.  Any history of bleeding or other blood problems.  Previous surgery.  Smoking history.  Other health problems. RISKS AND COMPLICATIONS All surgery carries some risk. However, hemorrhoid surgery usually goes smoothly. Possible complications could include:  Urinary retention.  Bleeding.  Infection.  A painful incision.  A reaction to the anesthesia (this is not common). BEFORE THE PROCEDURE   Stop using aspirin and non-steroidal anti-inflammatory drugs (NSAIDs) for pain relief. This includes prescription drugs and over-the-counter drugs such as ibuprofen and naproxen. Also stop taking vitamin E. If possible, do this two weeks before your surgery.  If you take blood-thinners, ask your healthcare provider when you should stop taking them.  You will probably have blood and urine tests done several days before your surgery.  Do not eat or drink for about 8 hours before the surgery.  Arrive at least an hour before the surgery, or whenever your surgeon recommends. This will give you time to check in  and fill out any needed paperwork.  Hemorrhoidectomy is often an outpatient procedure. This means you will be able to go home the same day. Sometimes, though, people stay overnight in the  hospital after the procedure. Ask your surgeon what to expect. Either way, make arrangements in advance for someone to drive you home. PROCEDURE   The preparation:  You will change into a hospital gown.  You will be given an IV. A needle will be inserted in your arm. Medication can flow directly into your body through this needle.  You might be given an enema to clear your rectum.  Once in the operating room, you will probably lie on your side or be repositioned later to lying on your stomach.  You will be given anesthesia (medication) so you will not feel anything during the surgery. The surgery often is done with local anesthesia (the area near the hemorrhoids will be numb and you will be drowsy but awake). Sometimes, general anesthesia is used (you will be asleep during the procedure).  The procedure:  There are a few different procedures for hemorrhoids. Be sure to ask you surgeon about the procedure, the risks and benefits.  Be sure to ask about what you need to do to take care of the wound, if there is one. AFTER THE PROCEDURE  You will stay in a recovery area until the anesthesia has worn off. Your blood pressure and pulse will be checked every so often.  You may feel a lot of pain in the area of the rectum.  Take all pain medication prescribed by your surgeon. Ask before taking any over-the-counter pain medicines.  Sometimes sitting in a warm bath can help relieve your pain.  To make sure you have bowel movements without straining:  You will probably need to take stool softeners (usually a pill) for a few days.  You should drink 8 to 10 glasses of water each day.  Your activity will be restricted for awhile. Ask your caregiver for a list of what you should and should not do while you recover. Document Released: 06/15/2009 Document Revised: 11/10/2011 Document Reviewed: 06/15/2009 [facility] Patient Information 2013 [place], [place].

## 2012-09-29 DIAGNOSIS — J309 Allergic rhinitis, unspecified: Secondary | ICD-10-CM | POA: Diagnosis not present

## 2012-10-06 DIAGNOSIS — J309 Allergic rhinitis, unspecified: Secondary | ICD-10-CM | POA: Diagnosis not present

## 2012-10-07 DIAGNOSIS — J309 Allergic rhinitis, unspecified: Secondary | ICD-10-CM | POA: Diagnosis not present

## 2012-10-12 DIAGNOSIS — J309 Allergic rhinitis, unspecified: Secondary | ICD-10-CM | POA: Diagnosis not present

## 2012-10-19 DIAGNOSIS — J309 Allergic rhinitis, unspecified: Secondary | ICD-10-CM | POA: Diagnosis not present

## 2012-10-20 ENCOUNTER — Ambulatory Visit (INDEPENDENT_AMBULATORY_CARE_PROVIDER_SITE_OTHER): Payer: Medicare Other | Admitting: Family Medicine

## 2012-10-20 ENCOUNTER — Encounter: Payer: Self-pay | Admitting: Family Medicine

## 2012-10-20 VITALS — BP 150/86 | HR 72 | Ht 64.0 in | Wt 138.0 lb

## 2012-10-20 DIAGNOSIS — H1045 Other chronic allergic conjunctivitis: Secondary | ICD-10-CM

## 2012-10-20 DIAGNOSIS — H1010 Acute atopic conjunctivitis, unspecified eye: Secondary | ICD-10-CM

## 2012-10-20 DIAGNOSIS — H04129 Dry eye syndrome of unspecified lacrimal gland: Secondary | ICD-10-CM | POA: Diagnosis not present

## 2012-10-20 DIAGNOSIS — H04123 Dry eye syndrome of bilateral lacrimal glands: Secondary | ICD-10-CM

## 2012-10-20 DIAGNOSIS — I10 Essential (primary) hypertension: Secondary | ICD-10-CM

## 2012-10-20 NOTE — Patient Instructions (Addendum)
2 Gram Low Sodium Diet A 2 gram sodium diet restricts the amount of sodium in the diet to no more than 2 g or 2000 mg daily. Limiting the amount of sodium is often used to help lower blood pressure. It is important if you have heart, liver, or kidney problems. Many foods contain sodium for flavor and sometimes as a preservative. When the amount of sodium in a diet needs to be low, it is important to know what to look for when choosing foods and drinks. The following includes some information and guidelines to help make it easier for you to adapt to a low sodium diet. QUICK TIPS  Do not add salt to food.  Avoid convenience items and fast food.  Choose unsalted snack foods.  Buy lower sodium products, often labeled as "lower sodium" or "no salt added."  Check food labels to learn how much sodium is in 1 serving.  When eating at a restaurant, ask that your food be prepared with less salt or none, if possible. READING FOOD LABELS FOR SODIUM INFORMATION The nutrition facts label is a good place to find how much sodium is in foods. Look for products with no more than 500 to 600 mg of sodium per meal and no more than 150 mg per serving. Remember that 2 g = 2000 mg. The food label may also list foods as:  Sodium-free: Less than 5 mg in a serving.  Very low sodium: 35 mg or less in a serving.  Low-sodium: 140 mg or less in a serving.  Light in sodium: 50% less sodium in a serving. For example, if a food that usually has 300 mg of sodium is changed to become light in sodium, it will have 150 mg of sodium.  Reduced sodium: 25% less sodium in a serving. For example, if a food that usually has 400 mg of sodium is changed to reduced sodium, it will have 300 mg of sodium. CHOOSING FOODS Grains  Avoid: Salted crackers and snack items. Some cereals, including instant hot cereals. Bread stuffing and biscuit mixes. Seasoned rice or pasta mixes.  Choose: Unsalted snack items. Low-sodium cereals, oats,  puffed wheat and rice, shredded wheat. English muffins and bread. Pasta. Meats  Avoid: Salted, canned, smoked, spiced, pickled meats, including fish and poultry. Bacon, ham, sausage, cold cuts, hot dogs, anchovies.  Choose: Low-sodium canned tuna and salmon. Fresh or frozen meat, poultry, and fish. Dairy  Avoid: Processed cheese and spreads. Cottage cheese. Buttermilk and condensed milk. Regular cheese.  Choose: Milk. Low-sodium cottage cheese. Yogurt. Sour cream. Low-sodium cheese. Fruits and Vegetables  Avoid: Regular canned vegetables. Regular canned tomato sauce and paste. Frozen vegetables in sauces. Olives. Pickles. Relishes. Sauerkraut.  Choose: Low-sodium canned vegetables. Low-sodium tomato sauce and paste. Frozen or fresh vegetables. Fresh and frozen fruit. Condiments  Avoid: Canned and packaged gravies. Worcestershire sauce. Tartar sauce. Barbecue sauce. Soy sauce. Steak sauce. Ketchup. Onion, garlic, and table salt. Meat flavorings and tenderizers.  Choose: Fresh and dried herbs and spices. Low-sodium varieties of mustard and ketchup. Lemon juice. Tabasco sauce. Horseradish. SAMPLE 2 GRAM SODIUM MEAL PLAN Breakfast / Sodium (mg)  1 cup low-fat milk / 143 mg  2 slices whole-wheat toast / 270 mg  1 tbs heart-healthy margarine / 153 mg  1 hard-boiled egg / 139 mg  1 small orange / 0 mg Lunch / Sodium (mg)  1 cup raw carrots / 76 mg   cup hummus / 298 mg  1 cup low-fat milk /   143 mg   cup red grapes / 2 mg  1 whole-wheat pita bread / 356 mg Dinner / Sodium (mg)  1 cup whole-wheat pasta / 2 mg  1 cup low-sodium tomato sauce / 73 mg  3 oz lean ground beef / 57 mg  1 small side salad (1 cup raw spinach leaves,  cup cucumber,  cup yellow bell pepper) with 1 tsp olive oil and 1 tsp red wine vinegar / 25 mg Snack / Sodium (mg)  1 container low-fat vanilla yogurt / 107 mg  3 graham cracker squares / 127 mg Nutrient Analysis  Calories: 2033  Protein:  77 g  Carbohydrate: 282 g  Fat: 72 g  Sodium: 1971 mg Document Released: 08/18/2005 Document Revised: 11/10/2011 Document Reviewed: 11/19/2009 [facility] Patient Information 2013 [place], [place].  Bring your BP monitor and list of blood pressures to your next visit.  Remember to hold the wrist monitor at the level of your heart (resting against chest, as shown).  Schedule your annual eye exam.  You can discuss dry eyes with eye doctor, and if not better by then, ask about Restasis for dry eyes.  In the meantime, use Refresh or Natural Tears frequently throughout the day, and drink plenty of fluids.

## 2012-10-20 NOTE — Progress Notes (Signed)
Chief Complaint  Patient presents with  . Eye Drainage    has really bad dry eyes but at the end of the day he is getting a white drainage that sits in the corner of his eye.(brought a picture with him).  . Hypertension    blood pressure going up and down. Also notes that sometimes one of his hands is cold and one is warn x 6 months. Also he has been having a "red face" lately.    HTN--noticing some more fluctuations in his blood pressure.  Running 112-168/76-102.  Average appears to be 140/85-90.  Only had 2 BP's over 140 in the last week.  He thinks maybe he had been having more salt in his diet. He doesn't add salt.  Denies headaches, chest pain.  Eye drainage is noticed late at night, not daily.  Eyes have been dry, he thinks related to allergies.  Eyes seem better since he stopped the Allegra, felt like it was drying eyes too much.  Felt much better after stopping med, but still has some dry eyes and slight drainage most evenings (not all).  He was given rx drops from the allergist, not sure that it helped.  Symptoms are tolerable, and better overall.  Symptoms much better after starting to use humidifier.  Denies any itchy eyes.  Sees Dr. Barnetta Chapel and gets allergy shots weekly. Last eye exam was a year ago.  Other concerns he mentions today: One hand or the other intermittently gets cold.  Isn't the same hand consistently. Dry itchy skin in the winter Bright red face every once in a while--BP was normal at that time (he checked it bc he was worried)--occurs once a week or two, at night.  Not related to exertion.  Past Medical History  Diagnosis Date  . Hypertension   . Unspecified hypothyroidism   . Mitral valve prolapse   . Foot drop, right   . BPH (benign prostatic hypertrophy)   . Iron deficiency anemia, unspecified 6/09  . Hearing loss in left ear   . Diverticulosis of colon 12/09  . Internal hemorrhoids   . GERD (gastroesophageal reflux disease)   . Allergic rhinitis, cause  unspecified     on allergy shots (Dr. Barnetta Chapel  . Allergic conjunctivitis    Past Surgical History  Procedure Laterality Date  . Prostate surgery  2007    photovaporization  . Cervical laminectomy  1972    C5-6  . Spine surgery  2006    L4-5 disk surgery  . Rotator cuff repair  10/2008    left; Dr. Rennis Chris  . Esophagogastroduodenoscopy  10/31/08    normal; Dr. Gerilyn Pilgrim  . Tonsillectomy     History   Social History  . Marital Status: Married    Spouse Name: N/A    Number of Children: 2  . Years of Education: N/A   Occupational History  . Not on file.   Social History Main Topics  . Smoking status: Former Smoker    Types: Cigarettes    Quit date: 09/01/1977  . Smokeless tobacco: Not on file  . Alcohol Use: Yes     Comment: 1-2 glasses wine per day  . Drug Use: No  . Sexually Active: Not on file   Other Topics Concern  . Not on file   Social History Narrative  . No narrative on file   Current Outpatient Prescriptions on File Prior to Visit  Medication Sig Dispense Refill  . aspirin 81 MG tablet Take 81  mg by mouth daily.        . Ferrous Sulfate (IRON) 325 (65 FE) MG TABS Take 1 tablet by mouth every other day.      . lisinopril-hydrochlorothiazide (PRINZIDE,ZESTORETIC) 20-12.5 MG per tablet Take 1 tablet by mouth daily.  90 tablet  3  . omeprazole (PRILOSEC) 20 MG capsule Take 1 capsule (20 mg total) by mouth daily.  90 capsule  0  . SYNTHROID 50 MCG tablet Take 1 tablet (50 mcg total) by mouth daily.  90 tablet  3   No current facility-administered medications on file prior to visit.   No Known Allergies  ROS:  Denies fevers, URI symptoms, chest pain palpitations, shortness of breath.  No further syncopal spells.  Denies nausea, vomiting, bowel changes, fatigue, weight changes, skin rashes or other concerns.  See HPI  PHYSICAL EXAM: BP 158/90  Pulse 72  Ht 5\' 4"  (1.626 m)  Wt 138 lb (62.596 kg)  BMI 23.68 kg/m2 150/86 on repeat by MD Talkative, somewhat anxious  male in no distress HEENT:  PERRL.  R eye, nasally there is mild conjunctival injection, otherwise clear.  No crusting or exudates noted.  Minimal soft tissue swelling medially bilaterally without erythema Neck: no lymphadenopathy or mass Heart: regular rate and rhythm Lungs: clear bilaterally Extremities: no edema.  2+ pulses.  Brisk cap refill in upper extremities Psych: normal mood, affect, hygiene and grooming.  Slightly anxious   ASSESSMENT/PLAN: Essential hypertension, benign  Allergic conjunctivitis  Dry eyes  HTN--a few elevated numbers in the last week, but mostly okay.  Higher in the weeks prior. Discussed low sodium diet in detail, continue daily exercise and monitoring.  Discussed proper technique to check BP with wrist cuff.  Can bring to f/u to have checked.  Recommended frequent use of Refresh or Natural Tears.  Schedule annual eye exam.  Briefly discussed restasis--will leave that decision to ophtho.  Schedule AWV/Med check plus for April/May.  Return sooner only if BP's running consistently high.  reassurred regarding his circulation in upper extremities, and his other complaints were discussed in detail, all questions answered. Visit 25 minutes, more than 1/2 spent counseling.

## 2012-10-27 DIAGNOSIS — J309 Allergic rhinitis, unspecified: Secondary | ICD-10-CM | POA: Diagnosis not present

## 2012-10-28 DIAGNOSIS — H52229 Regular astigmatism, unspecified eye: Secondary | ICD-10-CM | POA: Diagnosis not present

## 2012-10-28 DIAGNOSIS — H04129 Dry eye syndrome of unspecified lacrimal gland: Secondary | ICD-10-CM | POA: Diagnosis not present

## 2012-10-28 DIAGNOSIS — H524 Presbyopia: Secondary | ICD-10-CM | POA: Diagnosis not present

## 2012-10-28 DIAGNOSIS — H5200 Hypermetropia, unspecified eye: Secondary | ICD-10-CM | POA: Diagnosis not present

## 2012-11-17 DIAGNOSIS — J309 Allergic rhinitis, unspecified: Secondary | ICD-10-CM | POA: Diagnosis not present

## 2012-11-22 DIAGNOSIS — J309 Allergic rhinitis, unspecified: Secondary | ICD-10-CM | POA: Diagnosis not present

## 2012-11-24 ENCOUNTER — Encounter (INDEPENDENT_AMBULATORY_CARE_PROVIDER_SITE_OTHER): Payer: Medicare Other | Admitting: General Surgery

## 2012-11-24 DIAGNOSIS — J309 Allergic rhinitis, unspecified: Secondary | ICD-10-CM | POA: Diagnosis not present

## 2012-11-29 DIAGNOSIS — J309 Allergic rhinitis, unspecified: Secondary | ICD-10-CM | POA: Diagnosis not present

## 2012-12-01 DIAGNOSIS — J309 Allergic rhinitis, unspecified: Secondary | ICD-10-CM | POA: Diagnosis not present

## 2012-12-06 DIAGNOSIS — J309 Allergic rhinitis, unspecified: Secondary | ICD-10-CM | POA: Diagnosis not present

## 2012-12-08 DIAGNOSIS — J309 Allergic rhinitis, unspecified: Secondary | ICD-10-CM | POA: Diagnosis not present

## 2012-12-15 DIAGNOSIS — J309 Allergic rhinitis, unspecified: Secondary | ICD-10-CM | POA: Diagnosis not present

## 2012-12-22 DIAGNOSIS — J309 Allergic rhinitis, unspecified: Secondary | ICD-10-CM | POA: Diagnosis not present

## 2012-12-28 DIAGNOSIS — J309 Allergic rhinitis, unspecified: Secondary | ICD-10-CM | POA: Diagnosis not present

## 2013-01-03 ENCOUNTER — Other Ambulatory Visit: Payer: Self-pay | Admitting: *Deleted

## 2013-01-03 DIAGNOSIS — I10 Essential (primary) hypertension: Secondary | ICD-10-CM

## 2013-01-03 DIAGNOSIS — E039 Hypothyroidism, unspecified: Secondary | ICD-10-CM

## 2013-01-03 MED ORDER — SYNTHROID 50 MCG PO TABS: 50.0000 ug | ORAL_TABLET | Freq: Every day | ORAL | Status: DC

## 2013-01-03 MED ORDER — LISINOPRIL-HYDROCHLOROTHIAZIDE 20-12.5 MG PO TABS: 1.0000 | ORAL_TABLET | Freq: Every day | ORAL | Status: DC

## 2013-01-03 MED ORDER — OMEPRAZOLE 20 MG PO CPDR: 20.0000 mg | DELAYED_RELEASE_CAPSULE | Freq: Every day | ORAL | Status: DC

## 2013-01-05 DIAGNOSIS — J309 Allergic rhinitis, unspecified: Secondary | ICD-10-CM | POA: Diagnosis not present

## 2013-01-06 ENCOUNTER — Encounter (INDEPENDENT_AMBULATORY_CARE_PROVIDER_SITE_OTHER): Payer: Medicare Other | Admitting: General Surgery

## 2013-01-09 DIAGNOSIS — J069 Acute upper respiratory infection, unspecified: Secondary | ICD-10-CM | POA: Diagnosis not present

## 2013-01-12 DIAGNOSIS — J309 Allergic rhinitis, unspecified: Secondary | ICD-10-CM | POA: Diagnosis not present

## 2013-01-14 ENCOUNTER — Encounter (INDEPENDENT_AMBULATORY_CARE_PROVIDER_SITE_OTHER): Payer: Medicare Other | Admitting: General Surgery

## 2013-01-20 ENCOUNTER — Encounter: Payer: Medicare Other | Admitting: Family Medicine

## 2013-01-31 DIAGNOSIS — J309 Allergic rhinitis, unspecified: Secondary | ICD-10-CM | POA: Diagnosis not present

## 2013-02-09 DIAGNOSIS — J309 Allergic rhinitis, unspecified: Secondary | ICD-10-CM | POA: Diagnosis not present

## 2013-02-14 ENCOUNTER — Other Ambulatory Visit: Payer: Self-pay | Admitting: *Deleted

## 2013-02-14 DIAGNOSIS — I10 Essential (primary) hypertension: Secondary | ICD-10-CM

## 2013-02-14 DIAGNOSIS — K219 Gastro-esophageal reflux disease without esophagitis: Secondary | ICD-10-CM

## 2013-02-14 DIAGNOSIS — E039 Hypothyroidism, unspecified: Secondary | ICD-10-CM

## 2013-02-14 MED ORDER — LISINOPRIL-HYDROCHLOROTHIAZIDE 20-12.5 MG PO TABS: 1.0000 | ORAL_TABLET | Freq: Every day | ORAL | Status: DC

## 2013-02-14 MED ORDER — SYNTHROID 50 MCG PO TABS: 50.0000 ug | ORAL_TABLET | Freq: Every day | ORAL | Status: DC

## 2013-02-14 MED ORDER — OMEPRAZOLE 20 MG PO CPDR: 20.0000 mg | DELAYED_RELEASE_CAPSULE | Freq: Every day | ORAL | Status: DC

## 2013-02-16 DIAGNOSIS — J309 Allergic rhinitis, unspecified: Secondary | ICD-10-CM | POA: Diagnosis not present

## 2013-02-23 DIAGNOSIS — J309 Allergic rhinitis, unspecified: Secondary | ICD-10-CM | POA: Diagnosis not present

## 2013-03-01 DIAGNOSIS — J309 Allergic rhinitis, unspecified: Secondary | ICD-10-CM | POA: Diagnosis not present

## 2013-03-02 ENCOUNTER — Encounter (INDEPENDENT_AMBULATORY_CARE_PROVIDER_SITE_OTHER): Payer: Self-pay | Admitting: General Surgery

## 2013-03-02 ENCOUNTER — Ambulatory Visit (INDEPENDENT_AMBULATORY_CARE_PROVIDER_SITE_OTHER): Payer: Medicare Other | Admitting: General Surgery

## 2013-03-02 VITALS — BP 158/82 | HR 72 | Resp 14 | Ht 64.0 in | Wt 137.4 lb

## 2013-03-02 DIAGNOSIS — K648 Other hemorrhoids: Secondary | ICD-10-CM

## 2013-03-03 NOTE — Progress Notes (Signed)
Patient ID: Danny Frederick, male   DOB: Jun 14, 1934, 79 y.o.   MRN: [id number]  Here to schedule surgery  HPI Danny Frederick is a 79 y.o. male.  HPI 79 year old Danny Frederick comes in to reschedule Hemorrhoid surgery. I had last seen him in January of this year and we have scheduled an exam under anesthesia, hemorrhoidal banding and possible excisional hemorrhoidectomy. He unfortunately had to cancel his surgery due to 3 Deaths in the family over the course of 2 months. He had 3 brother-in-law's passed away from lung cancer. He denies any new medical issues for himself. He is having bowel movements on a daily basis. He is still using Metamucil on a daily basis. He denies any diarrhea or constipation. He is still having some perianal itching. He is using wet wipes. He is not vigorously scrubbing the area. He is interested in proceeding surgery Past Medical History  Diagnosis Date  . Hypertension   . Unspecified hypothyroidism   . Mitral valve prolapse   . Foot drop, right   . BPH (benign prostatic hypertrophy)   . Iron deficiency anemia, unspecified 6/09  . Hearing loss in left ear   . Diverticulosis of colon 12/09  . Internal hemorrhoids   . GERD (gastroesophageal reflux disease)   . Allergic rhinitis, cause unspecified     on allergy shots (Dr. Barnetta Chapel  . Allergic conjunctivitis     Past Surgical History  Procedure Laterality Date  . Prostate surgery  2007    photovaporization  . Cervical laminectomy  1972    C5-6  . Spine surgery  2006    L4-5 disk surgery  . Rotator cuff repair  10/2008    left; Dr. Rennis Chris  . Esophagogastroduodenoscopy  10/31/08    normal; Dr. Gerilyn Pilgrim  . Tonsillectomy      Family History  Problem Relation Age of Onset  . Diabetes Mother   . Heart disease Mother     CHF  . Stroke Father   . Parkinsonism Father   . Cancer Brother     bladder cancer  . Cancer Brother     prostate cancer  . Emphysema Brother     Social History History  Substance Use Topics   . Smoking status: Former Smoker    Types: Cigarettes    Quit date: 09/01/1977  . Smokeless tobacco: Not on file  . Alcohol Use: Yes     Comment: 1-2 glasses wine per day    No Known Allergies  Current Outpatient Prescriptions  Medication Sig Dispense Refill  . aspirin 81 MG tablet Take 81 mg by mouth daily.        . Ferrous Sulfate (IRON) 325 (65 FE) MG TABS Take 1 tablet by mouth every other day.      . lisinopril-hydrochlorothiazide (PRINZIDE,ZESTORETIC) 20-12.5 MG per tablet Take 1 tablet by mouth daily.  90 tablet  0  . omeprazole (PRILOSEC) 20 MG capsule Take 1 capsule (20 mg total) by mouth daily.  90 capsule  0  . SYNTHROID 50 MCG tablet Take 1 tablet (50 mcg total) by mouth daily.  90 tablet  0   No current facility-administered medications for this visit.    Review of Systems Review of Systems  Constitutional: Negative for fever, chills, appetite change and unexpected weight change.  HENT: Negative for congestion and trouble swallowing.   Eyes: Negative for visual disturbance.  Respiratory: Negative for chest tightness and shortness of breath.   Cardiovascular: Negative for chest pain and leg swelling.  No PND, no orthopnea, no DOE  Gastrointestinal:       See HPI  Genitourinary: Negative for dysuria and hematuria.  Musculoskeletal: Negative.   Skin: Negative for rash.  Neurological: Negative for seizures and speech difficulty.  Hematological: Does not bruise/bleed easily.  Psychiatric/Behavioral: Negative for behavioral problems and confusion.    Blood pressure 158/82, pulse 72, resp. rate 14, height 5\' 4"  (1.626 m), weight 137 lb 6.4 oz (62.324 kg).  Physical Exam Physical Exam  Vitals reviewed. Constitutional: He is oriented to person, place, and time. He appears well-developed and well-nourished. No distress.  HENT:  Head: Normocephalic and atraumatic.  Right Ear: External ear normal.  Left Ear: External ear normal.  Eyes: Conjunctivae are normal.  No scleral icterus.  Neck: No tracheal deviation present.  Cardiovascular: Normal rate, regular rhythm and normal heart sounds.   Pulmonary/Chest: Effort normal and breath sounds normal. No respiratory distress. He has no wheezes.  Abdominal: Soft. He exhibits no distension. There is no tenderness. There is no rebound.  Genitourinary:  Visual inspection only-left posterior lateral internal/external hemorrhoid. Anoscopy deferred; Some mild skin irritation around the anus  Musculoskeletal: He exhibits no edema and no tenderness.  Neurological: He is alert and oriented to person, place, and time.  Skin: Skin is warm and dry. No rash noted. He is not diaphoretic. No erythema.  Psychiatric: He has a normal mood and affect. His behavior is normal. Judgment and thought content normal.    Data Reviewed My last note  Assessment    3 column internal hemorrhoids     Plan    He has elected to proceed to the operating room for his hemorrhoids.  We will plan Exam under anesthesia, hemorrhoidal banding and possible excisional hemorrhoidectomy  I discussed the procedure in detail.  The patient was given Agricultural engineer.  We discussed the risks and benefits of surgery including, but not limited to bleeding, infection, blood clot formation, anesthesia risk, urinary retention, hemorrhoid recurrence, injury to the sphincters resulting in incontinence, and the rare possibility of anal canal narrowing. I explained that the likelihood of improvement of their symptoms is good  We discussed the typical postoperative course.  I stressed the importance of not becoming constipated after surgery.  The patient was encouraged to limit pain medication if possible as this increases the likelihood of becoming constipated. The patient was advised to take stool softners & drink 8-10 glasses of non-carbonated, non-alcoholic beverages per day and to eat a high fiber diet.  I also encouraged soaking in a water warm bath  for 15 minutes at a time several times a day and after a bowel movement.  The patient was advised to take laxatives such as milk of magnesia or Miralax if no bowel movement three days after surgery.  The patient was advised to expect some blood tinged drainage as well as some blood in their bowel movements.   Mary Sella. Andrey Campanile, MD, FACS General, Bariatric, & Minimally Invasive Surgery [facility] Surgery, [place]          [facility] M 03/03/2013, 10:51 AM

## 2013-03-07 ENCOUNTER — Ambulatory Visit (INDEPENDENT_AMBULATORY_CARE_PROVIDER_SITE_OTHER): Payer: Medicare Other | Admitting: Family Medicine

## 2013-03-07 ENCOUNTER — Ambulatory Visit
Admission: RE | Admit: 2013-03-07 | Discharge: 2013-03-07 | Disposition: A | Discharge status: Home / Self Care | Payer: Medicare Other | Source: Ambulatory Visit | Attending: Family Medicine | Admitting: Family Medicine

## 2013-03-07 ENCOUNTER — Encounter: Payer: Self-pay | Admitting: Family Medicine

## 2013-03-07 VITALS — BP 120/80 | HR 64 | Ht 64.0 in | Wt 136.0 lb

## 2013-03-07 DIAGNOSIS — M545 Low back pain, unspecified: Secondary | ICD-10-CM | POA: Diagnosis not present

## 2013-03-07 DIAGNOSIS — Z125 Encounter for screening for malignant neoplasm of prostate: Secondary | ICD-10-CM | POA: Diagnosis not present

## 2013-03-07 DIAGNOSIS — I10 Essential (primary) hypertension: Secondary | ICD-10-CM | POA: Diagnosis not present

## 2013-03-07 DIAGNOSIS — M25569 Pain in unspecified knee: Secondary | ICD-10-CM

## 2013-03-07 DIAGNOSIS — E039 Hypothyroidism, unspecified: Secondary | ICD-10-CM | POA: Diagnosis not present

## 2013-03-07 DIAGNOSIS — D500 Iron deficiency anemia secondary to blood loss (chronic): Secondary | ICD-10-CM | POA: Diagnosis not present

## 2013-03-07 DIAGNOSIS — M47817 Spondylosis without myelopathy or radiculopathy, lumbosacral region: Secondary | ICD-10-CM | POA: Diagnosis not present

## 2013-03-07 LAB — CBC WITH DIFFERENTIAL/PLATELET
Eosinophils Relative: 4 % (ref 0–5)
HCT: 40.7 % (ref 39.0–52.0)
Hemoglobin: 13.7 g/dL (ref 13.0–17.0)
Lymphocytes Relative: 31 % (ref 12–46)
Lymphs Abs: 1.9 10*3/uL (ref 0.7–4.0)
MCV: 81.4 fL (ref 78.0–100.0)
Monocytes Absolute: 0.5 10*3/uL (ref 0.1–1.0)
Monocytes Relative: 8 % (ref 3–12)
Platelets: 289 10*3/uL (ref 150–400)
RBC: 5 MIL/uL (ref 4.22–5.81)
WBC: 6.3 10*3/uL (ref 4.0–10.5)

## 2013-03-07 LAB — COMPREHENSIVE METABOLIC PANEL
CO2: 25 mEq/L (ref 19–32)
Creat: 1.15 mg/dL (ref 0.50–1.35)
Glucose, Bld: 82 mg/dL (ref 70–99)
Total Bilirubin: 0.6 mg/dL (ref 0.3–1.2)

## 2013-03-07 NOTE — Progress Notes (Signed)
Chief Complaint  Patient presents with  . Leg Pain    starts in his buttock area and goes down his legs. This has been going on for x 1 week.    See printed info that pt typed with his history.  Pain is bilateral, at base of buttocks, radiating down both thighs, into the mid-calf.  Has had this pain for just about 2 weeks, fairly "out of the blue".  Goes to the gym regularly, but no new exercises or change in routine. Pain is worse at night, especially with certain movements. Very stiff in the morning.  Pain is constant, but sometimes is more tolerable than the others.  Feels better with heat, just temporarily.  Hamstring stretches increases the pain (temporarily).    Feels similar to sciatica he had in the past as far as the quality of the pain, although then it wasn't into buttock, and only one leg was involved.  Denies any numbness or tingling, denies new weakness.  R "foot slap" persistant x many years, related to prior disk surgery. Hip pain comes and goes, tolerable.  Review of chart shows that he is past due for labs, with last thyroid check over a year ago.  He had to cancel and reschedule his CPE, which is now scheduled for the end of September.  He denies any thyroid-related symptoms.  Past Medical History  Diagnosis Date  . Hypertension   . Unspecified hypothyroidism   . Mitral valve prolapse   . Foot drop, right   . BPH (benign prostatic hypertrophy)   . Iron deficiency anemia, unspecified 6/09  . Hearing loss in left ear   . Diverticulosis of colon 12/09  . Internal hemorrhoids   . GERD (gastroesophageal reflux disease)   . Allergic rhinitis, cause unspecified     on allergy shots (Dr. Barnetta Chapel  . Allergic conjunctivitis    Past Surgical History  Procedure Laterality Date  . Prostate surgery  2007    photovaporization  . Cervical laminectomy  1972    C5-6  . Spine surgery  2006    L4-5 disk surgery  . Rotator cuff repair  10/2008    left; Dr. Rennis Chris  .  Esophagogastroduodenoscopy  10/31/08    normal; Dr. Gerilyn Pilgrim  . Tonsillectomy     History   Social History  . Marital Status: Married    Spouse Name: N/A    Number of Children: 2  . Years of Education: N/A   Occupational History  . Not on file.   Social History Main Topics  . Smoking status: Former Smoker    Types: Cigarettes    Quit date: 09/01/1977  . Smokeless tobacco: Not on file  . Alcohol Use: Yes     Comment: 1-2 glasses wine per day  . Drug Use: No  . Sexually Active: Not on file   Other Topics Concern  . Not on file   Social History Narrative  . No narrative on file   Current Outpatient Prescriptions on File Prior to Visit  Medication Sig Dispense Refill  . aspirin 81 MG tablet Take 81 mg by mouth daily.        . Ferrous Sulfate (IRON) 325 (65 FE) MG TABS Take 1 tablet by mouth every other day.      . lisinopril-hydrochlorothiazide (PRINZIDE,ZESTORETIC) 20-12.5 MG per tablet Take 1 tablet by mouth daily.  90 tablet  0  . omeprazole (PRILOSEC) 20 MG capsule Take 1 capsule (20 mg total) by mouth daily.  90 capsule  0  . SYNTHROID 50 MCG tablet Take 1 tablet (50 mcg total) by mouth daily.  90 tablet  0   No current facility-administered medications on file prior to visit.   No Known Allergies  ROS:  Denies fevers, URI symptoms, bleeding/bruising, rash, numbness/tingling/weakness, chest pain, shortness of breath.  Taking a little longer to void.  Denies dysuria, nocturia.  Denies other concerns or problems.  PHYSICAL EXAM: BP 120/80  Pulse 64  Ht 5\' 4"  (1.626 m)  Wt 136 lb (61.689 kg)  BMI 23.33 kg/m2 Pleasant elderly male in no distress  Spine nontender.  No CVA tenderness. No SI joint tenderness. Very slight tenderness at R sciatic notch.  Some discomfort with pyriformis stretching bilaterally, although no significant spasm is noted.  DTR's are 2+ at both knees, absent at both ankles.  Strength and sensation are normal. No weakness noted on right (prior weakness  per hx).   Negative straight leg raise.  Had some increased discomfort in hamstrings when standing with foot dorsiflexed (for strength testing).  ASSESSMENT/PLAN: Lumbago - Plan: DG Lumbar Spine Complete  IRON DEFICIENCY ANEMIA SECONDARY TO BLOOD LOSS - Plan: CBC with Differential, Ferritin  HYPERTENSION - Plan: Comprehensive metabolic panel  Unspecified hypothyroidism - Plan: TSH  Special screening for malignant neoplasm of prostate - Plan: PSA, Medicare  Pain in joint, lower leg, unspecified laterality - Plan: DG Lumbar Spine Complete  Buttock and leg pain, bilateral, symmetric Check back films--suspect there will be significant degenerative changes Suspect muscular component--hamstring and pyriformis. Cannot r/o S1 given location of discomfort, but doubt given exam and history.  Recommend pyriformis and hamstring stretches.  Trial of Aleve BID x 1 week (given the recent onset of his pain).  If not improving, then consider referral to Dr. Thereasa Distance vs PT.  Pt will keep [place] updated with his progress (referral needed for PT, not for chiro).  NONfasting labs today.

## 2013-03-07 NOTE — Patient Instructions (Addendum)
Go to [facility] to [facility] for x-rays of your back. Try doing the stretches shown for the pyriformis muscles. You may use aleve twice daily for a week, to see if that helps. If your x-rays don't show a reason, and your pain isn't improving with stretches, heat and Aleve, then I might recommend referring you for either physical therapy or to a chiropractor who does Active Release Technique (Dr. Thereasa Distance at [facility], [facility], on [address])

## 2013-03-08 ENCOUNTER — Encounter: Payer: Self-pay | Admitting: Family Medicine

## 2013-03-08 LAB — PSA, MEDICARE: PSA: 1.39 ng/mL (ref <=4.00)

## 2013-03-09 DIAGNOSIS — J309 Allergic rhinitis, unspecified: Secondary | ICD-10-CM | POA: Diagnosis not present

## 2013-03-16 DIAGNOSIS — J309 Allergic rhinitis, unspecified: Secondary | ICD-10-CM | POA: Diagnosis not present

## 2013-03-28 DIAGNOSIS — M48061 Spinal stenosis, lumbar region without neurogenic claudication: Secondary | ICD-10-CM | POA: Diagnosis not present

## 2013-03-29 ENCOUNTER — Other Ambulatory Visit: Payer: Self-pay | Admitting: Neurosurgery

## 2013-03-29 DIAGNOSIS — M48061 Spinal stenosis, lumbar region without neurogenic claudication: Secondary | ICD-10-CM

## 2013-03-31 DIAGNOSIS — J309 Allergic rhinitis, unspecified: Secondary | ICD-10-CM | POA: Diagnosis not present

## 2013-04-01 HISTORY — PX: SPINE SURGERY: SHX786

## 2013-04-04 ENCOUNTER — Ambulatory Visit
Admission: RE | Admit: 2013-04-04 | Discharge: 2013-04-04 | Disposition: A | Discharge status: Home / Self Care | Payer: Medicare Other | Source: Ambulatory Visit | Attending: Neurosurgery | Admitting: Neurosurgery

## 2013-04-04 DIAGNOSIS — M47817 Spondylosis without myelopathy or radiculopathy, lumbosacral region: Secondary | ICD-10-CM | POA: Diagnosis not present

## 2013-04-04 DIAGNOSIS — M48061 Spinal stenosis, lumbar region without neurogenic claudication: Secondary | ICD-10-CM | POA: Diagnosis not present

## 2013-04-04 DIAGNOSIS — M4120 Other idiopathic scoliosis, site unspecified: Secondary | ICD-10-CM | POA: Diagnosis not present

## 2013-04-04 MED ORDER — GADOBENATE DIMEGLUMINE 529 MG/ML IV SOLN
12.0000 mL | Freq: Once | INTRAVENOUS | Status: AC | PRN
  Administered 2013-04-04: 12 mL via INTRAVENOUS

## 2013-04-06 ENCOUNTER — Other Ambulatory Visit: Payer: Self-pay

## 2013-04-06 DIAGNOSIS — J309 Allergic rhinitis, unspecified: Secondary | ICD-10-CM | POA: Diagnosis not present

## 2013-04-07 DIAGNOSIS — K648 Other hemorrhoids: Secondary | ICD-10-CM

## 2013-04-07 HISTORY — PX: OTHER SURGICAL HISTORY: SHX169

## 2013-04-07 HISTORY — PX: HEMORRHOIDECTOMY WITH HEMORRHOID BANDING: SHX5633

## 2013-04-08 ENCOUNTER — Telehealth (INDEPENDENT_AMBULATORY_CARE_PROVIDER_SITE_OTHER): Payer: Self-pay | Admitting: General Surgery

## 2013-04-08 NOTE — Telephone Encounter (Signed)
Message copied by June Leap on Fri Apr 08, 2013  3:11 PM ------      Message from: Leanne Chang      Created: Fri Apr 08, 2013  9:40 AM      Regarding: Dr Lanier Prude: [phone]       Need 1st po for sx on 04/07/13. Has appt in 2 wks but says Dr Andrey Campanile wants to see him in 3 to 4 wks. May be e-mailed @ [email or web address] ------

## 2013-04-08 NOTE — Telephone Encounter (Signed)
Called and made patient an appt for 05/05/13 @ 8:45.Marland KitchenMarland Kitchenpatient doing well and is in agreement with POC.Marland Kitchenwill call the office if he needs [place]

## 2013-04-12 ENCOUNTER — Other Ambulatory Visit: Payer: Self-pay | Admitting: Neurosurgery

## 2013-04-12 DIAGNOSIS — M48061 Spinal stenosis, lumbar region without neurogenic claudication: Secondary | ICD-10-CM | POA: Diagnosis not present

## 2013-04-12 DIAGNOSIS — M7130 Other bursal cyst, unspecified site: Secondary | ICD-10-CM | POA: Diagnosis not present

## 2013-04-12 DIAGNOSIS — M47817 Spondylosis without myelopathy or radiculopathy, lumbosacral region: Secondary | ICD-10-CM | POA: Diagnosis not present

## 2013-04-15 DIAGNOSIS — J309 Allergic rhinitis, unspecified: Secondary | ICD-10-CM | POA: Diagnosis not present

## 2013-04-18 ENCOUNTER — Encounter: Payer: Self-pay | Admitting: *Deleted

## 2013-04-20 ENCOUNTER — Other Ambulatory Visit (INDEPENDENT_AMBULATORY_CARE_PROVIDER_SITE_OTHER): Payer: Self-pay | Admitting: *Deleted

## 2013-04-20 DIAGNOSIS — J309 Allergic rhinitis, unspecified: Secondary | ICD-10-CM | POA: Diagnosis not present

## 2013-04-20 MED ORDER — OXYCODONE-ACETAMINOPHEN 5-325 MG PO TABS: 1.0000 | ORAL_TABLET | ORAL | Status: DC | PRN

## 2013-04-20 NOTE — Pre-Procedure Instructions (Signed)
Danny Frederick  04/20/2013   Your procedure is scheduled on:  April 29, 2013 at 7:30 AM  Report to [facility] at 5:30 AM.  Call this number if you have problems the morning of surgery: [phone]   Remember:   Do not eat food or drink liquids after midnight.   Take these medicines the morning of surgery with A SIP OF WATER: omeprazole (PRILOSEC), SYNTHROID, oxyCODONE-acetaminophen (ROXICET)  Stop all Vitamins, Aspirin, Non-steroidals (Ibuprofen, Aleve, Motrin, Naproxen) and Herbal Medication as of 04/22/13      Do not wear jewelry, make-up or nail polish.  Do not wear lotions, powders, or perfumes. You may wear deodorant.  Do not shave 48 hours prior to surgery. Men may shave face and neck.  Do not bring valuables to the hospital.  [facility] is not responsible  for any belongings or valuables.  Contacts, dentures or bridgework may not be worn into surgery.  Leave suitcase in the car. After surgery it may be brought to your room.  For patients admitted to the hospital, checkout time is 11:00 AM the day of  discharge.     Special Instructions: Shower using CHG 2 nights before surgery and the night before surgery.  If you shower the day of surgery use CHG.  Use special wash - you have one bottle of CHG for all showers.  You should use approximately 1/3 of the bottle for each shower.   Please read over the following fact sheets that you were given: Pain Booklet, Coughing and Deep Breathing, Blood Transfusion Information, MRSA Information and Surgical Site Infection Prevention

## 2013-04-21 ENCOUNTER — Telehealth (INDEPENDENT_AMBULATORY_CARE_PROVIDER_SITE_OTHER): Payer: Self-pay | Admitting: General Surgery

## 2013-04-21 ENCOUNTER — Encounter: Payer: Self-pay | Admitting: Family Medicine

## 2013-04-21 ENCOUNTER — Encounter (HOSPITAL_COMMUNITY)
Admission: RE | Admit: 2013-04-21 | Discharge: 2013-04-21 | Disposition: A | Discharge status: Home / Self Care | Payer: Medicare Other | Source: Ambulatory Visit | Attending: Neurosurgery | Admitting: Neurosurgery

## 2013-04-21 ENCOUNTER — Ambulatory Visit (HOSPITAL_COMMUNITY)
Admission: RE | Admit: 2013-04-21 | Discharge: 2013-04-21 | Disposition: A | Discharge status: Home / Self Care | Payer: Medicare Other | Source: Ambulatory Visit | Attending: Anesthesiology | Admitting: Anesthesiology

## 2013-04-21 ENCOUNTER — Encounter (INDEPENDENT_AMBULATORY_CARE_PROVIDER_SITE_OTHER): Payer: Medicare Other | Admitting: General Surgery

## 2013-04-21 ENCOUNTER — Encounter (HOSPITAL_COMMUNITY): Payer: Self-pay | Admitting: Pharmacy Technician

## 2013-04-21 DIAGNOSIS — M47817 Spondylosis without myelopathy or radiculopathy, lumbosacral region: Secondary | ICD-10-CM | POA: Diagnosis not present

## 2013-04-21 DIAGNOSIS — R9431 Abnormal electrocardiogram [ECG] [EKG]: Secondary | ICD-10-CM | POA: Insufficient documentation

## 2013-04-21 DIAGNOSIS — Z01812 Encounter for preprocedural laboratory examination: Secondary | ICD-10-CM | POA: Diagnosis not present

## 2013-04-21 DIAGNOSIS — Z01818 Encounter for other preprocedural examination: Secondary | ICD-10-CM | POA: Insufficient documentation

## 2013-04-21 DIAGNOSIS — Z01810 Encounter for preprocedural cardiovascular examination: Secondary | ICD-10-CM | POA: Diagnosis not present

## 2013-04-21 DIAGNOSIS — M47814 Spondylosis without myelopathy or radiculopathy, thoracic region: Secondary | ICD-10-CM | POA: Diagnosis not present

## 2013-04-21 DIAGNOSIS — R0609 Other forms of dyspnea: Secondary | ICD-10-CM | POA: Diagnosis not present

## 2013-04-21 LAB — TYPE AND SCREEN
ABO/RH(D): B POS
Antibody Screen: NEGATIVE

## 2013-04-21 LAB — CBC
MCHC: 36 g/dL (ref 30.0–36.0)
RDW: 13.4 % (ref 11.5–15.5)

## 2013-04-21 LAB — SURGICAL PCR SCREEN
MRSA, PCR: NEGATIVE
Staphylococcus aureus: POSITIVE — AB

## 2013-04-21 LAB — BASIC METABOLIC PANEL
BUN: 17 mg/dL (ref 6–23)
Calcium: 10.1 mg/dL (ref 8.4–10.5)
Creatinine, Ser: 1.1 mg/dL (ref 0.50–1.35)
GFR calc Af Amer: 73 mL/min — ABNORMAL LOW (ref >90)
GFR calc non Af Amer: 63 mL/min — ABNORMAL LOW (ref >90)

## 2013-04-21 LAB — ABO/RH: ABO/RH(D): B POS

## 2013-04-21 NOTE — Telephone Encounter (Signed)
R/S patient's appt from 9/11 to 8/28@8 :30..patient is very pleased with appt date and time

## 2013-04-21 NOTE — Telephone Encounter (Signed)
Message copied by June Leap on Thu Apr 21, 2013  2:36 PM ------      Message from: Fredia Sorrow      Created: Thu Apr 21, 2013  1:42 PM      Regarding: 1st post op hemoroids      Contact: [phone]       Patient would like to be seen next week having back surgery 04-29-13 and is worried about being able to get in. He says he is fine please call.      Thanks Inetta Fermo ------

## 2013-04-21 NOTE — Pre-Procedure Instructions (Deleted)
Danny Frederick  04/21/2013   Your procedure is scheduled on:  April 29, 2013  Report to [facility] at 5:30 AM.  Call this number if you have problems the morning of surgery: [phone]   Remember:   Do not eat food or drink liquids after midnight.   Take these medicines the morning of surgery with A SIP OF WATER: omeprazole (PRILOSEC) 20 MG capsule,  oxyCODONE-acetaminophen (ROXICET) 5-325 MG per tablet, SYNTHROID 50 MCG tablet     Do not wear jewelry, make-up or nail polish.  Do not wear lotions, powders, or perfumes. You may wear deodorant.  Do not shave 48 hours prior to surgery. Men may shave face and neck.  Do not bring valuables to the hospital.  [facility] is not responsible  for any belongings or valuables.  Contacts, dentures or bridgework may not be worn into surgery.  Leave suitcase in the car. After surgery it may be brought to your room.  For patients admitted to the hospital, checkout time is 11:00 AM the day of  discharge.   Patients discharged the day of surgery will not be allowed to drive  home.  Name and phone number of your driver:   Special Instructions: Shower using CHG 2 nights before surgery and the night before surgery.  If you shower the day of surgery use CHG.  Use special wash - you have one bottle of CHG for all showers.  You should use approximately 1/3 of the bottle for each shower.   Please read over the following fact sheets that you were given: Pain Booklet, Coughing and Deep Breathing, Blood Transfusion Information and Surgical Site Infection Prevention

## 2013-04-22 DIAGNOSIS — J309 Allergic rhinitis, unspecified: Secondary | ICD-10-CM | POA: Diagnosis not present

## 2013-04-22 NOTE — Progress Notes (Addendum)
Anesthesia Chart Review:  Patient is a 79 year old male scheduled for L4-5, L5-S1 PLIF on 04/29/13 by Dr. Franky Macho.  History includes former smoker, HTN, hypothyroidism, BPH, right foot drop, hearing loss in left ear, GERD, iron deficiency anemia, tonsillectomy, previous cervical and lumbar spine surgery, MVP (but not noted on 2013 echo).  PCP is Dr. [name].  He was evaluated by cardiologist Dr. Jens Som in 2013 for syncope (possible micturition syncope).  His last visit was on 03/30/12. No further cardiology work-up was recommended following an unremarkable echo and event monitor--unless with recurrent events.  EKG on 04/21/13 showed NSR, possible LAE, LAD.  Echo on 01/27/12 showed: - Left ventricle: The cavity size was normal. Wall thickness was normal. Systolic function was normal. The estimated ejection fraction was in the range of 55% to 65%. Wall motion was normal; there were no regional wall motion abnormalities. Doppler parameters are consistent with abnormal left ventricular relaxation (grade 1 diastolic dysfunction). - Mitral valve: No MVP noted. Trivial regurgitation. - Right atrium: The atrium was mildly dilated. - Atrial septum: No defect or patent foramen ovale was identified. - Tricuspid valve: Trivial regurgitation.  Event monitor in 12/2011 showed SR with occasional PVCs and PACs.  CXR on 04/21/13 showed: Mild hyperinflation. No active disease. Mild degenerative changes lower thoracic spine. Nodular density left base laterally may represent nipple shadow. Repeat frontal view of the chest with nipple markers is recommended for confirmation.  I've left a message for Shanda Bumps at Dr. Sueanne Margarita office to review CXR for additional orders/recommendations. (Update: CXR report called to Darl Pikes at Dr. Sueanne Margarita office on 04/25/13 11:34 AM.  Defer additional recommendations/orders to Dr. Franky Macho.)  Preoperative labs noted.    Anticipate he can proceed as planned.  Velna Ochs [facility]/Anesthesiology Phone [phone] 04/22/2013 2:55 PM

## 2013-04-25 ENCOUNTER — Other Ambulatory Visit: Payer: Self-pay | Admitting: *Deleted

## 2013-04-25 ENCOUNTER — Encounter: Payer: Self-pay | Admitting: Family Medicine

## 2013-04-25 DIAGNOSIS — I10 Essential (primary) hypertension: Secondary | ICD-10-CM

## 2013-04-25 DIAGNOSIS — E039 Hypothyroidism, unspecified: Secondary | ICD-10-CM

## 2013-04-25 MED ORDER — LISINOPRIL-HYDROCHLOROTHIAZIDE 20-12.5 MG PO TABS: 1.0000 | ORAL_TABLET | Freq: Every day | ORAL | Status: DC

## 2013-04-25 MED ORDER — SYNTHROID 50 MCG PO TABS: 50.0000 ug | ORAL_TABLET | Freq: Every day | ORAL | Status: DC

## 2013-04-25 MED ORDER — OMEPRAZOLE 20 MG PO CPDR: 20.0000 mg | DELAYED_RELEASE_CAPSULE | ORAL | Status: DC

## 2013-04-27 DIAGNOSIS — J309 Allergic rhinitis, unspecified: Secondary | ICD-10-CM | POA: Diagnosis not present

## 2013-04-28 ENCOUNTER — Ambulatory Visit (INDEPENDENT_AMBULATORY_CARE_PROVIDER_SITE_OTHER): Payer: Medicare Other | Admitting: General Surgery

## 2013-04-28 ENCOUNTER — Encounter (INDEPENDENT_AMBULATORY_CARE_PROVIDER_SITE_OTHER): Payer: Self-pay | Admitting: General Surgery

## 2013-04-28 VITALS — BP 136/88 | HR 80 | Resp 16 | Ht 64.0 in | Wt 136.0 lb

## 2013-04-28 DIAGNOSIS — Z09 Encounter for follow-up examination after completed treatment for conditions other than malignant neoplasm: Secondary | ICD-10-CM

## 2013-04-28 MED ORDER — CEFAZOLIN SODIUM-DEXTROSE 2-3 GM-% IV SOLR
2.0000 g | INTRAVENOUS | Status: AC
  Administered 2013-04-29 (×2): 2 g via INTRAVENOUS
  Filled 2013-04-28: qty 50

## 2013-04-28 NOTE — Progress Notes (Signed)
Subjective:     Patient ID: Danny Frederick, male   DOB: April 10, 1934, 79 y.o.   MRN: [id number]  HPI 79 year old Caucasian male comes in after undergoing exam under anesthesia, hemorrhoidal banding x3 on August 14. He states that he did great after surgery. He states that he had essentially no pain. He had a little bit of bleeding the night of surgery. He had no difficulty urinating. He denies any abdominal pain, fever, chills. He reports daily bowel movements. He states that his perianal itching is almost resolved. He thinks that it may be related to eating nuts. Because he ate some nuts  the other day and his itching returned. Since he stopped eating the nuts he has not had much itching. He is scheduled to have lower back surgery tomorrow  Review of Systems     Objective:   Physical Exam BP 136/88  Pulse 80  Resp 16  Ht 5\' 4"  (1.626 m)  Wt 136 lb (61.689 kg)  BMI 23.33 kg/m2  SpO2 98% Alert, no apparent distress Rectal-digital rectal exam and anoscopy was deferred today since he is too soon from operation. However his perianal skin is intact. There is no erythema or external hemorrhoid    Assessment:     Status post exam under anesthesia, hemorrhoidal banding x3     Plan:     Overall I'm very pleased with how he is doing. We will delay his next appointment due to his upcoming back surgery. We'll see him in about 8 weeks at which time we'll do anoscopy. In the interim he was advised to continue drinking plenty of water and eating a high fiber diet. We also discussed the importance of remaining not constipated while recovering from his back surgery.  Mary Sella. Andrey Campanile, MD, FACS General, Bariatric, & Minimally Invasive Surgery [facility] Surgery, [place]

## 2013-04-28 NOTE — Patient Instructions (Addendum)
Continue drinking plenty of water and eating a high fiber While recovering from back surgery and on a pain pill - take a stool softner and miralax  Please email in MyChart to acknowledge receipt of your instructions

## 2013-04-29 ENCOUNTER — Inpatient Hospital Stay (HOSPITAL_COMMUNITY)
Admission: RE | Admit: 2013-04-29 | Discharge: 2013-05-02 | DRG: 460 | Disposition: A | Discharge status: Home Health | Payer: Medicare Other | Source: Ambulatory Visit | Attending: Neurosurgery | Admitting: Neurosurgery

## 2013-04-29 ENCOUNTER — Encounter (HOSPITAL_COMMUNITY): Payer: Self-pay | Admitting: Vascular Surgery

## 2013-04-29 ENCOUNTER — Inpatient Hospital Stay (HOSPITAL_COMMUNITY): Payer: Medicare Other

## 2013-04-29 ENCOUNTER — Inpatient Hospital Stay (HOSPITAL_COMMUNITY): Payer: Medicare Other | Admitting: Anesthesiology

## 2013-04-29 ENCOUNTER — Encounter (HOSPITAL_COMMUNITY): Payer: Self-pay | Admitting: *Deleted

## 2013-04-29 ENCOUNTER — Encounter (HOSPITAL_COMMUNITY)
Admission: RE | Disposition: A | Discharge status: Home Health | Payer: Self-pay | Source: Ambulatory Visit | Attending: Neurosurgery

## 2013-04-29 DIAGNOSIS — M48061 Spinal stenosis, lumbar region without neurogenic claudication: Secondary | ICD-10-CM | POA: Diagnosis not present

## 2013-04-29 DIAGNOSIS — Z79899 Other long term (current) drug therapy: Secondary | ICD-10-CM | POA: Diagnosis not present

## 2013-04-29 DIAGNOSIS — IMO0002 Reserved for concepts with insufficient information to code with codable children: Secondary | ICD-10-CM | POA: Diagnosis not present

## 2013-04-29 DIAGNOSIS — Z7982 Long term (current) use of aspirin: Secondary | ICD-10-CM | POA: Diagnosis not present

## 2013-04-29 DIAGNOSIS — N9989 Other postprocedural complications and disorders of genitourinary system: Secondary | ICD-10-CM | POA: Diagnosis not present

## 2013-04-29 DIAGNOSIS — M545 Low back pain, unspecified: Secondary | ICD-10-CM | POA: Diagnosis not present

## 2013-04-29 DIAGNOSIS — M47817 Spondylosis without myelopathy or radiculopathy, lumbosacral region: Principal | ICD-10-CM | POA: Diagnosis present

## 2013-04-29 DIAGNOSIS — N400 Enlarged prostate without lower urinary tract symptoms: Secondary | ICD-10-CM | POA: Diagnosis not present

## 2013-04-29 DIAGNOSIS — Z87891 Personal history of nicotine dependence: Secondary | ICD-10-CM

## 2013-04-29 DIAGNOSIS — M539 Dorsopathy, unspecified: Secondary | ICD-10-CM | POA: Diagnosis not present

## 2013-04-29 DIAGNOSIS — Y831 Surgical operation with implant of artificial internal device as the cause of abnormal reaction of the patient, or of later complication, without mention of misadventure at the time of the procedure: Secondary | ICD-10-CM | POA: Diagnosis not present

## 2013-04-29 DIAGNOSIS — M7130 Other bursal cyst, unspecified site: Secondary | ICD-10-CM | POA: Diagnosis not present

## 2013-04-29 DIAGNOSIS — R339 Retention of urine, unspecified: Secondary | ICD-10-CM | POA: Diagnosis not present

## 2013-04-29 DIAGNOSIS — M216X9 Other acquired deformities of unspecified foot: Secondary | ICD-10-CM | POA: Diagnosis present

## 2013-04-29 SURGERY — POSTERIOR LUMBAR FUSION 2 LEVEL
Anesthesia: General | Site: Back | Wound class: Clean

## 2013-04-29 MED ORDER — 0.9 % SODIUM CHLORIDE (POUR BTL) OPTIME
TOPICAL | Status: DC | PRN
  Administered 2013-04-29 (×2): 1000 mL

## 2013-04-29 MED ORDER — ASPIRIN EC 81 MG PO TBEC
81.0000 mg | DELAYED_RELEASE_TABLET | Freq: Every day | ORAL | Status: DC
  Administered 2013-04-29 – 2013-05-02 (×4): 81 mg via ORAL
  Filled 2013-04-29 (×4): qty 1

## 2013-04-29 MED ORDER — LACTATED RINGERS IV SOLN
INTRAVENOUS | Status: DC | PRN
  Administered 2013-04-29 (×4): via INTRAVENOUS

## 2013-04-29 MED ORDER — PHENYLEPHRINE HCL 10 MG/ML IJ SOLN
10.0000 mg | INTRAVENOUS | Status: DC | PRN
  Administered 2013-04-29: 20 ug/min via INTRAVENOUS

## 2013-04-29 MED ORDER — LISINOPRIL-HYDROCHLOROTHIAZIDE 20-12.5 MG PO TABS: 1.0000 | ORAL_TABLET | Freq: Every day | ORAL | Status: DC

## 2013-04-29 MED ORDER — OXYCODONE HCL 5 MG PO TABS: 5.0000 mg | ORAL_TABLET | Freq: Once | ORAL | Status: DC | PRN

## 2013-04-29 MED ORDER — PROPOFOL 10 MG/ML IV BOLUS
INTRAVENOUS | Status: DC | PRN
  Administered 2013-04-29: 100 mg via INTRAVENOUS
  Administered 2013-04-29: 20 mg via INTRAVENOUS

## 2013-04-29 MED ORDER — HYDROMORPHONE HCL PF 1 MG/ML IJ SOLN
INTRAMUSCULAR | Status: AC
  Filled 2013-04-29: qty 1

## 2013-04-29 MED ORDER — HYDROMORPHONE HCL PF 1 MG/ML IJ SOLN
0.2500 mg | INTRAMUSCULAR | Status: DC | PRN
  Administered 2013-04-29 (×3): 0.5 mg via INTRAVENOUS

## 2013-04-29 MED ORDER — THROMBIN 20000 UNITS EX SOLR
CUTANEOUS | Status: DC | PRN
  Administered 2013-04-29: 09:00:00 via TOPICAL

## 2013-04-29 MED ORDER — GLYCOPYRROLATE 0.2 MG/ML IJ SOLN
INTRAMUSCULAR | Status: DC | PRN
  Administered 2013-04-29: 0.6 mg via INTRAVENOUS

## 2013-04-29 MED ORDER — BUPIVACAINE LIPOSOME 1.3 % IJ SUSP
INTRAMUSCULAR | Status: DC | PRN
  Administered 2013-04-29: 20 mL

## 2013-04-29 MED ORDER — EPHEDRINE SULFATE 50 MG/ML IJ SOLN
INTRAMUSCULAR | Status: DC | PRN
  Administered 2013-04-29: 2.5 mg via INTRAVENOUS

## 2013-04-29 MED ORDER — MORPHINE SULFATE 2 MG/ML IJ SOLN
1.0000 mg | INTRAMUSCULAR | Status: DC | PRN
  Administered 2013-04-29 – 2013-05-01 (×4): 4 mg via INTRAVENOUS
  Filled 2013-04-29 (×3): qty 2

## 2013-04-29 MED ORDER — BUPIVACAINE LIPOSOME 1.3 % IJ SUSP
20.0000 mL | INTRAMUSCULAR | Status: DC
  Filled 2013-04-29: qty 20

## 2013-04-29 MED ORDER — MIDAZOLAM HCL 5 MG/5ML IJ SOLN
INTRAMUSCULAR | Status: DC | PRN
  Administered 2013-04-29 (×2): 1 mg via INTRAVENOUS

## 2013-04-29 MED ORDER — CEFAZOLIN SODIUM 1-5 GM-% IV SOLN
1.0000 g | Freq: Three times a day (TID) | INTRAVENOUS | Status: AC
  Administered 2013-04-29 – 2013-04-30 (×2): 1 g via INTRAVENOUS
  Filled 2013-04-29 (×2): qty 50

## 2013-04-29 MED ORDER — SENNA 8.6 MG PO TABS
1.0000 | ORAL_TABLET | Freq: Two times a day (BID) | ORAL | Status: DC
  Administered 2013-04-29 – 2013-05-02 (×5): 8.6 mg via ORAL
  Filled 2013-04-29 (×8): qty 1

## 2013-04-29 MED ORDER — SUFENTANIL CITRATE 50 MCG/ML IV SOLN
INTRAVENOUS | Status: DC | PRN
  Administered 2013-04-29 (×2): 10 ug via INTRAVENOUS
  Administered 2013-04-29: 30 ug via INTRAVENOUS
  Administered 2013-04-29 (×3): 10 ug via INTRAVENOUS

## 2013-04-29 MED ORDER — LIDOCAINE HCL (CARDIAC) 20 MG/ML IV SOLN
INTRAVENOUS | Status: DC | PRN
  Administered 2013-04-29: 100 mg via INTRAVENOUS

## 2013-04-29 MED ORDER — HYDROCHLOROTHIAZIDE 12.5 MG PO CAPS
12.5000 mg | ORAL_CAPSULE | Freq: Every day | ORAL | Status: DC
  Administered 2013-04-29 – 2013-05-02 (×4): 12.5 mg via ORAL
  Filled 2013-04-29 (×4): qty 1

## 2013-04-29 MED ORDER — ONDANSETRON HCL 4 MG/2ML IJ SOLN
4.0000 mg | INTRAMUSCULAR | Status: DC | PRN
  Administered 2013-04-29 – 2013-05-02 (×2): 4 mg via INTRAVENOUS
  Filled 2013-04-29 (×2): qty 2

## 2013-04-29 MED ORDER — POLYETHYLENE GLYCOL 3350 17 G PO PACK
17.0000 g | PACK | Freq: Every day | ORAL | Status: DC
  Administered 2013-04-29 – 2013-05-02 (×4): 17 g via ORAL
  Filled 2013-04-29 (×4): qty 1

## 2013-04-29 MED ORDER — ONDANSETRON HCL 4 MG/2ML IJ SOLN: 4.0000 mg | Freq: Once | INTRAMUSCULAR | Status: DC | PRN

## 2013-04-29 MED ORDER — DOCUSATE SODIUM 100 MG PO CAPS
100.0000 mg | ORAL_CAPSULE | Freq: Two times a day (BID) | ORAL | Status: DC
  Administered 2013-04-29 – 2013-05-02 (×6): 100 mg via ORAL
  Filled 2013-04-29 (×4): qty 1

## 2013-04-29 MED ORDER — CEFAZOLIN SODIUM-DEXTROSE 2-3 GM-% IV SOLR
INTRAVENOUS | Status: AC
  Filled 2013-04-29: qty 50

## 2013-04-29 MED ORDER — OXYCODONE HCL 5 MG/5ML PO SOLN: 5.0000 mg | Freq: Once | ORAL | Status: DC | PRN

## 2013-04-29 MED ORDER — PHENOL 1.4 % MT LIQD: 1.0000 | OROMUCOSAL | Status: DC | PRN

## 2013-04-29 MED ORDER — OXYCODONE-ACETAMINOPHEN 5-325 MG PO TABS
1.0000 | ORAL_TABLET | ORAL | Status: DC | PRN
  Administered 2013-04-29 – 2013-04-30 (×2): 2 via ORAL
  Administered 2013-04-30 (×2): 1 via ORAL
  Administered 2013-04-30: 2 via ORAL
  Administered 2013-04-30: 1 via ORAL
  Administered 2013-04-30 – 2013-05-02 (×8): 2 via ORAL
  Filled 2013-04-29 (×6): qty 2
  Filled 2013-04-29 (×2): qty 1
  Filled 2013-04-29 (×6): qty 2

## 2013-04-29 MED ORDER — ZOLPIDEM TARTRATE 5 MG PO TABS: 5.0000 mg | ORAL_TABLET | Freq: Every evening | ORAL | Status: DC | PRN

## 2013-04-29 MED ORDER — SODIUM CHLORIDE 0.9 % IV SOLN: 250.0000 mL | INTRAVENOUS | Status: DC

## 2013-04-29 MED ORDER — FERROUS SULFATE 325 (65 FE) MG PO TABS
325.0000 mg | ORAL_TABLET | ORAL | Status: DC
  Administered 2013-05-02: 325 mg via ORAL
  Filled 2013-04-29 (×2): qty 1

## 2013-04-29 MED ORDER — ACETAMINOPHEN 650 MG RE SUPP: 650.0000 mg | RECTAL | Status: DC | PRN

## 2013-04-29 MED ORDER — LEVOTHYROXINE SODIUM 50 MCG PO TABS
50.0000 ug | ORAL_TABLET | Freq: Every day | ORAL | Status: DC
  Administered 2013-04-30 – 2013-05-02 (×3): 50 ug via ORAL
  Filled 2013-04-29 (×4): qty 1

## 2013-04-29 MED ORDER — MORPHINE SULFATE 2 MG/ML IJ SOLN
INTRAMUSCULAR | Status: AC
  Filled 2013-04-29: qty 2

## 2013-04-29 MED ORDER — LISINOPRIL 20 MG PO TABS
20.0000 mg | ORAL_TABLET | Freq: Every day | ORAL | Status: DC
  Administered 2013-04-29 – 2013-05-02 (×4): 20 mg via ORAL
  Filled 2013-04-29 (×4): qty 1

## 2013-04-29 MED ORDER — SODIUM CHLORIDE 0.9 % IJ SOLN
3.0000 mL | Freq: Two times a day (BID) | INTRAMUSCULAR | Status: DC
  Administered 2013-04-30 – 2013-05-02 (×4): 3 mL via INTRAVENOUS

## 2013-04-29 MED ORDER — HYDROCODONE-ACETAMINOPHEN 5-325 MG PO TABS: 1.0000 | ORAL_TABLET | ORAL | Status: DC | PRN

## 2013-04-29 MED ORDER — ALBUMIN HUMAN 5 % IV SOLN
INTRAVENOUS | Status: DC | PRN
  Administered 2013-04-29: 13:00:00 via INTRAVENOUS

## 2013-04-29 MED ORDER — LIDOCAINE-EPINEPHRINE 0.5 %-1:200000 IJ SOLN
INTRAMUSCULAR | Status: DC | PRN
  Administered 2013-04-29: 10 mL

## 2013-04-29 MED ORDER — ACETAMINOPHEN 325 MG PO TABS: 650.0000 mg | ORAL_TABLET | ORAL | Status: DC | PRN

## 2013-04-29 MED ORDER — PANTOPRAZOLE SODIUM 40 MG PO TBEC
40.0000 mg | DELAYED_RELEASE_TABLET | Freq: Every day | ORAL | Status: DC
  Administered 2013-04-30 – 2013-05-02 (×3): 40 mg via ORAL
  Filled 2013-04-29 (×3): qty 1

## 2013-04-29 MED ORDER — MEPERIDINE HCL 25 MG/ML IJ SOLN: 6.2500 mg | INTRAMUSCULAR | Status: DC | PRN

## 2013-04-29 MED ORDER — NEOSTIGMINE METHYLSULFATE 1 MG/ML IJ SOLN
INTRAMUSCULAR | Status: DC | PRN
  Administered 2013-04-29: 5 mg via INTRAVENOUS

## 2013-04-29 MED ORDER — SODIUM CHLORIDE 0.9 % IJ SOLN: 3.0000 mL | INTRAMUSCULAR | Status: DC | PRN

## 2013-04-29 MED ORDER — ONDANSETRON HCL 4 MG/2ML IJ SOLN
INTRAMUSCULAR | Status: DC | PRN
  Administered 2013-04-29: 4 mg via INTRAVENOUS

## 2013-04-29 MED ORDER — POTASSIUM CHLORIDE IN NACL 20-0.9 MEQ/L-% IV SOLN
INTRAVENOUS | Status: DC
  Administered 2013-04-29: 21:00:00 via INTRAVENOUS
  Filled 2013-04-29 (×7): qty 1000

## 2013-04-29 MED ORDER — ASPIRIN 81 MG PO TABS: 81.0000 mg | ORAL_TABLET | Freq: Every day | ORAL | Status: DC

## 2013-04-29 MED ORDER — MENTHOL 3 MG MT LOZG: 1.0000 | LOZENGE | OROMUCOSAL | Status: DC | PRN

## 2013-04-29 MED ORDER — ROCURONIUM BROMIDE 100 MG/10ML IV SOLN
INTRAVENOUS | Status: DC | PRN
  Administered 2013-04-29: 20 mg via INTRAVENOUS
  Administered 2013-04-29 (×3): 10 mg via INTRAVENOUS
  Administered 2013-04-29: 20 mg via INTRAVENOUS
  Administered 2013-04-29: 10 mg via INTRAVENOUS
  Administered 2013-04-29: 50 mg via INTRAVENOUS
  Administered 2013-04-29 (×2): 10 mg via INTRAVENOUS

## 2013-04-29 MED ORDER — DIAZEPAM 5 MG PO TABS
5.0000 mg | ORAL_TABLET | Freq: Four times a day (QID) | ORAL | Status: DC | PRN
  Administered 2013-05-01 – 2013-05-02 (×3): 5 mg via ORAL
  Filled 2013-04-29 (×3): qty 1

## 2013-04-29 SURGICAL SUPPLY — 78 items
BAG DECANTER FOR FLEXI CONT (MISCELLANEOUS) ×2 IMPLANT
BENZOIN TINCTURE PRP APPL 2/3 (GAUZE/BANDAGES/DRESSINGS) IMPLANT
BLADE SURG ROTATE 9660 (MISCELLANEOUS) IMPLANT
BUR MATCHSTICK NEURO 3.0 LAGG (BURR) ×4 IMPLANT
BUR ROUND FLUTED 5 RND (BURR) ×2 IMPLANT
CAGE COROENT 10X9X23 (Cage) ×2 IMPLANT
CAGE COROENT 13X9X23-4 (Cage) ×2 IMPLANT
CAGE COROENT MP 11X23X9 (Cage) ×2 IMPLANT
CANISTER SUCTION 2500CC (MISCELLANEOUS) ×2 IMPLANT
CLOTH BEACON ORANGE TIMEOUT ST (SAFETY) ×2 IMPLANT
CONT SPEC 4OZ CLIKSEAL STRL BL (MISCELLANEOUS) ×4 IMPLANT
COVER BACK TABLE 24X17X13 BIG (DRAPES) IMPLANT
COVER TABLE BACK 60X90 (DRAPES) ×2 IMPLANT
DECANTER SPIKE VIAL GLASS SM (MISCELLANEOUS) ×2 IMPLANT
DERMABOND ADHESIVE PROPEN (GAUZE/BANDAGES/DRESSINGS) ×1
DERMABOND ADVANCED (GAUZE/BANDAGES/DRESSINGS) ×1
DERMABOND ADVANCED .7 DNX12 (GAUZE/BANDAGES/DRESSINGS) ×1 IMPLANT
DERMABOND ADVANCED .7 DNX6 (GAUZE/BANDAGES/DRESSINGS) ×1 IMPLANT
DRAPE C-ARM 42X72 X-RAY (DRAPES) ×4 IMPLANT
DRAPE C-ARMOR (DRAPES) ×2 IMPLANT
DRAPE LAPAROTOMY 100X72X124 (DRAPES) ×2 IMPLANT
DRAPE POUCH INSTRU U-SHP 10X18 (DRAPES) ×2 IMPLANT
DRAPE SURG 17X23 STRL (DRAPES) ×2 IMPLANT
DRESSING TELFA 8X3 (GAUZE/BANDAGES/DRESSINGS) IMPLANT
DRSG OPSITE POSTOP 4X8 (GAUZE/BANDAGES/DRESSINGS) ×2 IMPLANT
DURAPREP 26ML APPLICATOR (WOUND CARE) ×2 IMPLANT
DURASEAL APPLICATOR TIP (TIP) ×2 IMPLANT
DURASEAL SPINE SEALANT 3ML (MISCELLANEOUS) ×2 IMPLANT
ELECT REM PT RETURN 9FT ADLT (ELECTROSURGICAL) ×2
ELECTRODE REM PT RTRN 9FT ADLT (ELECTROSURGICAL) ×1 IMPLANT
GAUZE SPONGE 4X4 16PLY XRAY LF (GAUZE/BANDAGES/DRESSINGS) IMPLANT
GLOVE BIOGEL PI IND STRL 8.5 (GLOVE) ×1 IMPLANT
GLOVE BIOGEL PI INDICATOR 8.5 (GLOVE) ×1
GLOVE ECLIPSE 6.5 STRL STRAW (GLOVE) ×4 IMPLANT
GLOVE ECLIPSE 7.5 STRL STRAW (GLOVE) ×4 IMPLANT
GLOVE EXAM NITRILE LRG STRL (GLOVE) IMPLANT
GLOVE EXAM NITRILE MD LF STRL (GLOVE) IMPLANT
GLOVE EXAM NITRILE XL STR (GLOVE) IMPLANT
GLOVE EXAM NITRILE XS STR PU (GLOVE) IMPLANT
GLOVE INDICATOR 7.5 STRL GRN (GLOVE) ×6 IMPLANT
GLOVE SURG SS PI 7.0 STRL IVOR (GLOVE) ×8 IMPLANT
GOWN BRE IMP SLV AUR LG STRL (GOWN DISPOSABLE) ×4 IMPLANT
GOWN BRE IMP SLV AUR XL STRL (GOWN DISPOSABLE) ×8 IMPLANT
GOWN STRL REIN 2XL LVL4 (GOWN DISPOSABLE) IMPLANT
KIT BASIN OR (CUSTOM PROCEDURE TRAY) ×2 IMPLANT
KIT POSITION SURG JACKSON T1 (MISCELLANEOUS) ×2 IMPLANT
KIT ROOM TURNOVER OR (KITS) ×2 IMPLANT
MILL MEDIUM DISP (BLADE) ×2 IMPLANT
NEEDLE HYPO 21X1.5 SAFETY (NEEDLE) ×2 IMPLANT
NEEDLE HYPO 25X1 1.5 SAFETY (NEEDLE) ×2 IMPLANT
NEEDLE SPNL 18GX3.5 QUINCKE PK (NEEDLE) IMPLANT
NS IRRIG 1000ML POUR BTL (IV SOLUTION) ×2 IMPLANT
PACK LAMINECTOMY NEURO (CUSTOM PROCEDURE TRAY) ×2 IMPLANT
PAD ARMBOARD 7.5X6 YLW CONV (MISCELLANEOUS) ×6 IMPLANT
PENCIL BUTTON HOLSTER BLD 10FT (ELECTRODE) ×2 IMPLANT
ROD 55MM (Rod) ×2 IMPLANT
ROD SPNL 55XPREBNT NS MAS (Rod) ×2 IMPLANT
SCREW LOCK (Screw) ×6 IMPLANT
SCREW LOCK FXNS SPNE MAS PL (Screw) ×6 IMPLANT
SCREW SHANK 5.5X30MM (Screw) ×4 IMPLANT
SCREW SHANK 6.5X65 (Screw) ×4 IMPLANT
SCREW SHANKS 5.5X35 (Screw) ×4 IMPLANT
SCREW TULIP 5.5 (Screw) ×12 IMPLANT
SPONGE GAUZE 4X4 12PLY (GAUZE/BANDAGES/DRESSINGS) IMPLANT
SPONGE LAP 4X18 X RAY DECT (DISPOSABLE) IMPLANT
SPONGE SURGIFOAM ABS GEL 100 (HEMOSTASIS) ×2 IMPLANT
STRIP CLOSURE SKIN 1/2X4 (GAUZE/BANDAGES/DRESSINGS) IMPLANT
SUT PROLENE 6 0 BV (SUTURE) ×4 IMPLANT
SUT VIC AB 0 CT1 18XCR BRD8 (SUTURE) ×2 IMPLANT
SUT VIC AB 0 CT1 8-18 (SUTURE) ×2
SUT VIC AB 2-0 CT1 18 (SUTURE) ×4 IMPLANT
SUT VIC AB 3-0 SH 8-18 (SUTURE) ×6 IMPLANT
SYR 20CC LL (SYRINGE) ×2 IMPLANT
SYR 20ML ECCENTRIC (SYRINGE) ×2 IMPLANT
TOWEL OR 17X24 6PK STRL BLUE (TOWEL DISPOSABLE) ×2 IMPLANT
TOWEL OR 17X26 10 PK STRL BLUE (TOWEL DISPOSABLE) ×2 IMPLANT
TUBE CONNECTING 12X1/4 (SUCTIONS) ×2 IMPLANT
WATER STERILE IRR 1000ML POUR (IV SOLUTION) ×2 IMPLANT

## 2013-04-29 NOTE — Transfer of Care (Signed)
Immediate Anesthesia Transfer of Care Note  Patient: Corbyn Navarette  Procedure(s) Performed: Procedure(s) with comments: Lumbar Four to Five, Lumbar Five to Sacral One posterior lumbar interbody fusion with interbody prothesis posterolateral arthrodesis and posterior segmental instrumentation (N/A) - POSTERIOR LUMBAR FUSION 2 LEVEL  Patient Location: PACU  Anesthesia Type:General  Level of Consciousness: awake, alert , sedated and patient cooperative  Airway & Oxygen Therapy: Patient Spontanous Breathing and Patient connected to face mask oxygen  Post-op Assessment: Report given to PACU RN, Post -op Vital signs reviewed and stable and Patient moving all extremities  Post vital signs: Reviewed and stable  Complications: No apparent anesthesia complications

## 2013-04-29 NOTE — Anesthesia Procedure Notes (Signed)
Procedure Name: Intubation Date/Time: 04/29/2013 8:05 AM Performed by: Coralee Rud Pre-anesthesia Checklist: Patient identified, Emergency Drugs available, Suction available, Patient being monitored and Timeout performed Patient Re-evaluated:Patient Re-evaluated prior to inductionOxygen Delivery Method: Circle system utilized Preoxygenation: Pre-oxygenation with 100% oxygen Intubation Type: IV induction Ventilation: Mask ventilation without difficulty Laryngoscope Size: Miller and 3 Grade View: Grade I Tube type: Oral Tube size: 8.0 mm Number of attempts: 1 Airway Equipment and Method: Stylet Placement Confirmation: ETT inserted through vocal cords under direct vision,  positive ETCO2 and breath sounds checked- equal and bilateral Secured at: 22 cm Tube secured with: Tape Dental Injury: Teeth and Oropharynx as per pre-operative assessment

## 2013-04-29 NOTE — Op Note (Signed)
04/29/2013  3:16 PM  PATIENT:  Danny Frederick  79 y.o. male  PRE-OPERATIVE DIAGNOSIS:  lumbar spondylosis lumbar stenosis lumbar synovial cyst L4/5, L5/s1  POST-OPERATIVE DIAGNOSIS:  lumbar spondylosis lumbar stenosis lumbar synovial cyst L4/5,5/s1  PROCEDURE:  Procedure(s): Lumbar Four to Five, Lumbar Five to Sacral One posterior lumbar interbody fusion morselized autograft with interbody prothesis Peek,( nuvasive cages)  L4/5 3mmx23mm cage on the right, 81mmx23mm on the right L5/s1, 41mmx23mm on the left   posterior segmental instrumentation Nuvasive pedicle screws L4-S1  SURGEON:  Surgeon(s): Danny Hurt, MD Danny Fake, MD  ASSISTANTS:Hirsch, Fayrene Fearing  ANESTHESIA:   general  EBL:  Total I/O In: 3350 [I.V.:3000; Blood:100; IV Piggyback:250] Out: 1300 [Urine:850; Blood:450]  BLOOD ADMINISTERED:100 CC CELLSAVER  CELL SAVER GIVEN:yes  COUNT:per nursing  DRAINS: none   SPECIMEN:  No Specimen  DICTATION: Danny Frederick is a 79 y.o. male whom was taken to the operating room intubated, and placed under a general anesthetic without difficulty. A foley catheter was placed under sterile conditions. He was positioned prone on a Jackson stable with all pressure points properly padded.  The lumbar region was prepped and draped in a sterile manner. I infiltrated 10cc's 1/2%lidocaine/1:2000,000 strength epinephrine into the planned incision. I opened the skin with a 10 blade and took the incision down to the thoracolumbar fascia. I exposed the lamina of L3,4,5 and S1 in a subperiosteal fashion bilaterally. I confirmed my location with an intraoperative xray.  I placed self retaining retractors and placed the pedicle screws.  I placed 6 pedicle screws using fluoroscopic guidance. I created an entry site with the drill, then cannulated the pedicle with a drill at L4, and 5. I then tapped the pilot hole with a 5.65mm tap. These 4 screws were placed in a fashion to be in cortical bone. I  then placed 30mm x 5.5 mm screws at L4, 35mm x 5.51mm screws at L5. For the sacral screws I placed conventionally directed pedicle screws with fluoroscopic guidance. I created and entry site with the drill, then used the pedicle probe to cannulate the pedicle. I tapped each hole then placed screws into the sacrum. All holes were checked for cutouts. None were appreciated.   I decompressed the spinal canal via complete laminectomies of L4,and L5 bilaterally. These were redo laminectomies as he had undergone previous surgery. He was markedly stenotic and had synovial cysts at L5/S1. I decompressed the L4, and L5 roots. I completed work beyond what was necessary to perform and plif and to place the cages. I did facetectomies and followed the roots around the facets  Bilaterally. I opened the disc spaces and completed discetomies at L4/5, and L5/S1 bilaterally. I placed one 10mm cage at L4/5 due to the scoliotic curve where he was collapsed on the right side. I filled the other side with Dr.Hirsch's assistance with morselized autograft. At L5/S1 I was able to place cages bilaterally with morselized autograft. Foraminotomies were performed bilaterally to make more space and decompress the S1 roots bilaterally. We irrigated the wound then completed the construct.  We placed rods through the screw heads and secured them with locking caps bilaterally.  I primarily close a dural opening present upon exposing the thecal sac on the right at L4. I placed three sutures and achieved closure. I placed duraseal over the closure. I then closed the wound in layers with vicryl sutures, approximating the thoracolumbar fascia, subcutaneous, and subcuticular planes. I used dermabond for a sterile dressing, and honeycomb dressing.  PLAN OF CARE: Admit to inpatient   PATIENT DISPOSITION:  PACU - hemodynamically stable.   Delay start of Pharmacological VTE agent (>24hrs) due to surgical blood loss or risk of bleeding:  yes

## 2013-04-29 NOTE — Anesthesia Preprocedure Evaluation (Addendum)
Anesthesia Evaluation  Patient identified by MRN, date of birth, ID band Patient awake    Reviewed: Allergy & Precautions, H&P , NPO status   Airway Mallampati: I TM Distance: >3 FB Neck ROM: Full    Dental  (+) Edentulous Upper and Teeth Intact   Pulmonary neg pulmonary ROS, former smoker,  Quit in 1979   Pulmonary exam normal       Cardiovascular hypertension, Pt. on medications negative cardio ROS  Rhythm:Regular Rate:Normal     Neuro/Psych negative neurological ROS     GI/Hepatic Neg liver ROS, GERD-  Medicated and Controlled,  Endo/Other  Hypothyroidism Synthyroid  Renal/GU negative Renal ROS     Musculoskeletal negative musculoskeletal ROS (+)   Abdominal Normal abdominal exam  (+)   Peds  Hematology negative hematology ROS (+)   Anesthesia Other Findings   Reproductive/Obstetrics negative OB ROS                          Anesthesia Physical Anesthesia Plan  ASA: II  Anesthesia Plan: General   Post-op Pain Management:    Induction: Intravenous  Airway Management Planned: Oral ETT  Additional Equipment:   Intra-op Plan:   Post-operative Plan: Extubation in OR  Informed Consent: I have reviewed the patients History and Physical, chart, labs and discussed the procedure including the risks, benefits and alternatives for the proposed anesthesia with the patient or authorized representative who has indicated his/her understanding and acceptance.   Dental advisory given  Plan Discussed with: CRNA and Surgeon  Anesthesia Plan Comments:        Anesthesia Quick Evaluation

## 2013-04-29 NOTE — H&P (Signed)
BP 205/87  Pulse 65  Temp(Src) 96.7 F (35.9 C) (Oral)  Resp 18  SpO2 100%      [name] comes in today for evaluation of pain which he has in his lower extremities. He has this pain whenever he stands and whenever he walks. On his intake sheet, when he was asked how far he can he walk without any pain, he wrote 0, and when he was asked how long he can stand without pain, he also wrote 0. He has a long-standing right foot drop, and this was a problem in the distant past. He did undergo surgery. This was in 2004, but the dorsiflexors in the right foot never improved. He did not have any pain at that time and has no pain similar to that right now either.   His symptoms are worse whenever he walks, stands, bends, or arises from sitting. Sneezing and the like do not cause any problems. On his own pain chart, he simply lists the posterior aspect of both lower extremities. He does not list pain any other place. He has had chiropractic treatment for this problem. He has also been taking some anti-inflammatories. He took Aleve without relief. He has had this pain made worse by two long trips, which he had to take to [place] by car and two long air flights to the [place], all within the past three months.   PAST MEDICAL HISTORY:                   Current Medical Conditions:  Prostate hypertrophy. He has had some shoulder problems and he has had the back and neck problems.            Medications and Allergies:  Currently, he is taking omeprazole, Synthroid, lisinopril, an iron tablet, and aspirin. ( He has no known drug allergies. He has no medication allergies.)            Prior operations:  He has undergone surgery in the cervical spine, he believes at C5-6, and in the lumbar spine; for prostate enlargement; and he has had rotator cuff surgery on the left side.   REVIEW OF SYSTEMS:                                    Positive for muscle pain. He denies constitutional, skin, hematologic,  endocrine, eye, ear, nose, throat, cardiovascular, respiratory, gastrointestinal, genitourinary, allergic, neurological, and psychiatric problems.   FAMILY HISTORY:                                            Significant for bladder cancer and prostate cancer. Mother had diabetes. Father had hypertension. Cerebrovascular disease and Parkinson's disease also present in the history.                                     SOCIAL HISTORY:                                            He is married and he used to smoke. He stopped smoking in 1979. He does drink daily.  REVIEW OF SYSTEMS:                                    Denies allergic, hematologic, endocrine, psychiatric, skin, genitourinary, gastrointestinal, respiratory, ear, nose, throat, mouth, and constitutional problems.   PHYSICAL EXAMINATION:          General: He is 5\' 4"  tall and weighs 137 pounds. He has a blood pressure of 185/87 and has a pulse of 86.   DIAGNOSIS:                                                     Lumbar stenosis and neurogenic claudication.  DATA:                                                  What he has is severe stenosis at L5-S1 due to synovial cyst and facet arthropathy bilaterally.  He is scoliotic in the lumbar spine.  Has significant scoliosis present at 4-5 and 3-4.  Via  foraminal compromise at 4-5 on the right side.  The conus is normal.  The caudal equina is normal.               IMPRESSION/PLAN:                             Danny Frederick has signs and symptoms classic for neurogenic claudication.  I do believe he is going to be better as a result of an operation that he has decided to undergo, which is a lumbar fusion.  This would involve decompression at 4-5 and at 5-1 with pedicle screws and rod fixation.  Interbodies will be placed, but certainly the biggest problem is the stenosis.  Benefits, bleeding, infection or the need for further surgery, fusion failure, hardware failure, damage to the nerve roots, bowel  or bladder dysfunction, lower extremity weakness, nerve damage all were discussed along with other risks.  He understands and wishes to proceed.

## 2013-04-30 NOTE — Evaluation (Signed)
Occupational Therapy Evaluation Patient Details Name: Danny Frederick MRN: [id number] DOB: Mar 12, 1934 Today's Date: 04/30/2013 Time: 7829-5621 OT Time Calculation (min): 36 min  OT Assessment / Plan / Recommendation History of present illness 2 level fusion L4-S1; PMHx Lt rotator cuff surgery, cervical and lumbar surgery with reports of post-op Rt dorsiflexion weakness   Clinical Impressio   Pt admitted with above. Pt currently with functional limitations due to the deficits listed below (see OT Problem List). Pt will benefit from skilled OT to increase their safety and independence with ADL and functional mobility for ADL to facilitate discharge to venue listed below.       OT Assessment  Patient needs continued OT Services    Follow Up Recommendations  No OT follow up       Equipment Recommendations  3 in 1 bedside comode;Tub/shower seat (Needs 2 3n1s (knows he will have to pay out of pocket for 1)       Frequency  Min 2X/week    Precautions / Restrictions Precautions Precautions: Back Precaution Booklet Issued: Yes (comment) Precaution Comments: pt able to state 2/3 precautions by end of session       ADL  Eating/Feeding: Independent Where Assessed - Eating/Feeding: Chair Grooming: Set up Where Assessed - Grooming: Supported sitting Upper Body Bathing: Set up;Supervision/safety Where Assessed - Upper Body Bathing: Supported sitting Lower Body Bathing: Minimal assistance Where Assessed - Lower Body Bathing: Supported sit to stand Upper Body Dressing: Set up Where Assessed - Upper Body Dressing: Supported sitting Lower Body Dressing: Minimal assistance Where Assessed - Lower Body Dressing: Supported sit to Pharmacist, hospital: Minimal Dentist Method: Sit to Barista: Raised toilet seat with arms (or 3-in-1 over toilet) Toileting - Clothing Manipulation and Hygiene: Minimal assistance Where Assessed - Medical sales representative and Hygiene: Standing Equipment Used: Rolling walker;Gait belt Transfers/Ambulation Related to ADLs: Min A for all with RW ADL Comments: Can cross legs to get to feet; however struggles with getting socks back on    OT Diagnosis: Generalized weakness  OT Problem List: Decreased activity tolerance;Decreased knowledge of use of DME or AE;Decreased knowledge of precautions;Impaired balance (sitting and/or standing) OT Treatment Interventions: Self-care/ADL training;Balance training;DME and/or AE instruction;Patient/family education   OT Goals(Current goals can be found in the care plan section) Acute Rehab OT Goals Patient Stated Goal: be able to go upstairs to his bedroom each night OT Goal Formulation: With patient Time For Goal Achievement: 05/07/13 Potential to Achieve Goals: Good  Visit Information  Last OT Received On: 04/30/13 Assistance Needed: +1 PT/OT Co-Evaluation/Treatment: Yes History of Present Illness: 2 level fusion L4-S1; PMHx Lt rotator cuff surgery, cervical and lumbar surgery with reports of post-op Rt dorsiflexion weakness       Prior Functioning     Home Living Family/patient expects to be discharged to:: Private residence Living Arrangements: Spouse/significant other Available Help at Discharge: Family;Available 24 hours/day Type of Home: House Home Access: Stairs to enter [employer] of Steps: 1 Entrance Stairs-Rails: None Home Layout: Two level;Bed/bath upstairs Alternate Level Stairs-Number of Steps: 12, landing then 3 Alternate Level Stairs-Rails: Right;Left (right for first 12 steps; left for last 3 steps) Home Equipment: None Prior Function Level of Independence: Independent Communication Communication: No difficulties Dominant Hand: Right         Vision/Perception Vision - History Patient Visual Report: No change from baseline   Cognition  Cognition Arousal/Alertness: Awake/alert Behavior During Therapy: WFL  for tasks assessed/performed Overall Cognitive Status: Within  Functional Limits for tasks assessed (slightly slow processing; ? meds/anesthesia)    Extremity/Trunk Assessment Upper Extremity Assessment Upper Extremity Assessment: Overall WFL for tasks assessed Lower Extremity Assessment Lower Extremity Assessment: RLE deficits/detail RLE Deficits / Details: pt reports h/o "foot slap" however ankle DF and toe extensors 5/5 Cervical / Trunk Assessment Cervical / Trunk Assessment: Normal     Mobility Bed Mobility Bed Mobility: Rolling Right;Right Sidelying to Sit;Sitting - Scoot to Edge of Bed Rolling Right: 5: Supervision Right Sidelying to Sit: 4: Min assist;With rails;HOB flat Sitting - Scoot to Edge of Bed: 5: Supervision Details for Bed Mobility Assistance: vc for sequencing to maintain back precautions Transfers Sit to Stand: 4: Min assist Stand to Sit: 4: Min assist Details for Transfer Assistance: x 2; vc for safe/proper use of RW           End of Session OT - End of Session Equipment Utilized During Treatment: Gait belt;Rolling walker Activity Tolerance: Patient tolerated treatment well Patient left: in chair;with call bell/phone within reach;with family/visitor present       Evette Georges [phone] 04/30/2013, 4:52 PM

## 2013-04-30 NOTE — Progress Notes (Signed)
Pt OOB with walker. Ambulated within the room back and forth couple of times. Tolerated fair. No brace order and pt not fitted for a brace. Foley d/cd.

## 2013-04-30 NOTE — Progress Notes (Signed)
Doing well. C/o appropriate incisional soreness. No Numbness, tingling, weakness No Nausea /vomiting Amb well  Temp:  [97 F (36.1 C)-97.8 F (36.6 C)] 97.8 F (36.6 C) (08/30 0547) Pulse Rate:  [61-107] 69 (08/30 0547) Resp:  [11-19] 18 (08/30 0547) BP: (124-164)/(55-83) 158/79 mmHg (08/30 0547) SpO2:  [98 %-100 %] 100 % (08/30 0547) Weight:  [65.4 kg (144 lb 2.9 oz)] 65.4 kg (144 lb 2.9 oz) (08/29 1700) Good strength and sensation Incision CDI  Plan: Pt doing well - increase activity - ? D/c Monday +/-

## 2013-04-30 NOTE — Progress Notes (Signed)
Asked patient throughout morning if he felt like getting OOB and ambulating, finally agreed around noon, where we ambulated in circles throughout room bc he said he "didnt quite feel ready for the hall yet."

## 2013-04-30 NOTE — Progress Notes (Addendum)
Patient's foley removed before the start of this shift. Patient unable to void by noon, so I&O cath preformed then. Pt still unable to void by 5:30pm. BS preformed and scan only revealed 17cc in bladder but i do not believe machine to be reading accurately with that result. Spoke to patient about options for another I&O cath vs re-inserting foley and he would like the foley reinserted.  Danny Frederick, Danny Frederick   Foley reinserted by a fellow RN with immediate urine return of 200cc yellow urine.  Danny Frederick, Yvette Rack

## 2013-04-30 NOTE — Progress Notes (Signed)
Physical Therapy Treatment Patient Details Name: Danny Frederick MRN: [id number] DOB: 05-04-1934 Today's Date: 04/30/2013 Time: 1478-2956 PT Time Calculation (min): 32 min  PT Assessment / Plan / Recommendation  History of Present Illness 2 level fusion L4-S1; PMHx Lt rotator cuff surgery, cervical and lumbar surgery with reports of post-op Rt dorsiflexion weakness   PT Comments   Patient is s/p above surgery resulting in the deficits listed below (see PT Problem List). Pt's wife present throughout session. Pt mobilizing well and should progress without difficulty. Pt does have multiple steps to second floor bedroom, however could sleep on downstairs couch if needed (new and firm). Patient will benefit from skilled PT to increase their independence and safety with mobility (while adhering to their precautions) to allow discharge home with wife.    Follow Up Recommendations  Home health PT;Supervision/Assistance - 24 hour     Does the patient have the potential to tolerate intense rehabilitation     Barriers to Discharge        Equipment Recommendations  Rolling walker with 5" wheels    Recommendations for Other Services    Frequency Min 5X/week   Progress towards PT Goals    Plan      Precautions / Restrictions Precautions Precautions: Back Precaution Booklet Issued: Yes (comment) Precaution Comments: pt able to state 2/3 precautions by end of session   Pertinent Vitals/Pain 3/10 in back (none in legs); pt with multiple questions re: use of pain meds    Mobility  Bed Mobility Bed Mobility: Rolling Right;Right Sidelying to Sit;Sitting - Scoot to Edge of Bed Rolling Right: 5: Supervision Right Sidelying to Sit: 4: Min assist;With rails;HOB flat Sitting - Scoot to Edge of Bed: 5: Supervision Details for Bed Mobility Assistance: vc for sequencing to maintain back precautions Transfers Transfers: Sit to Stand;Stand to Sit Sit to Stand: 4: Min assist Stand to Sit: 4: Min  assist Details for Transfer Assistance: x 2; vc for safe/proper use of RW Ambulation/Gait Ambulation/Gait Assistance: 4: Min assist Ambulation Distance (Feet): 125 Feet Assistive device: Rolling walker Ambulation/Gait Assistance Details: vc for safe/proper use of RW (tends to push too far ahead and trunk moves towards flexion; able to correct with cues) Gait Pattern: Step-through pattern    Exercises     PT Diagnosis: Difficulty walking;Acute pain  PT Problem List: Decreased mobility;Decreased knowledge of use of DME;Decreased knowledge of precautions;Pain PT Treatment Interventions: DME instruction;Gait training;Stair training;Functional mobility training;Therapeutic activities;Patient/family education   PT Goals (current goals can now be found in the care plan section) Acute Rehab PT Goals Patient Stated Goal: be able to go upstairs to his bedroom each night PT Goal Formulation: With patient Time For Goal Achievement: 05/03/13 Potential to Achieve Goals: Good  Visit Information  Last PT Received On: 04/30/13 Assistance Needed: +1 PT/OT Co-Evaluation/Treatment: Yes History of Present Illness: 2 level fusion L4-S1; PMHx Lt rotator cuff surgery, cervical and lumbar surgery with reports of post-op Rt dorsiflexion weakness    Subjective Data  Patient Stated Goal: be able to go upstairs to his bedroom each night   Cognition  Cognition Arousal/Alertness: Awake/alert Behavior During Therapy: WFL for tasks assessed/performed Overall Cognitive Status: Within Functional Limits for tasks assessed (slightly slow processing; ? meds/anesthesia)    Balance     End of Session PT - End of Session Equipment Utilized During Treatment: Gait belt Activity Tolerance: Patient tolerated treatment well Patient left: in chair;with call bell/phone within reach;with family/visitor present Nurse Communication: Mobility status;Patient requests pain meds  GP     Danny Frederick 04/30/2013, 4:35  PM Pager [phone]

## 2013-04-30 NOTE — Progress Notes (Signed)
CSW consulted for NHP.  PT c/s pending.  CSW will f/u according to PT recommendations.

## 2013-05-01 MED ORDER — TAMSULOSIN HCL 0.4 MG PO CAPS
0.4000 mg | ORAL_CAPSULE | Freq: Every day | ORAL | Status: DC
  Administered 2013-05-01 – 2013-05-02 (×2): 0.4 mg via ORAL
  Filled 2013-05-01 (×3): qty 1

## 2013-05-01 NOTE — Progress Notes (Signed)
Ambulated 3x around unit today with RN. Tolerated very well

## 2013-05-01 NOTE — Progress Notes (Signed)
Physical Therapy Treatment Patient Details Name: Danny Frederick MRN: [id number] DOB: March 15, 1934 Today's Date: 05/01/2013 Time: 4782-9562 PT Time Calculation (min): 17 min  PT Assessment / Plan / Recommendation  History of Present Illness 2 level fusion L4-S1; PMHx Lt rotator cuff surgery, cervical and lumbar surgery with reports of post-op Rt dorsiflexion weakness   PT Comments   Pt progressing well with mobility. Initiated stair education today (see details below). Pt wishes to practice again with spouse tomorrow before discharge home.   Follow Up Recommendations  Home health PT;Supervision/Assistance - 24 hour     Equipment Recommendations  Rolling walker with 5" wheels       Frequency Min 5X/week   Progress towards PT Goals Progress towards PT goals: Progressing toward goals  Plan Current plan remains appropriate    Precautions / Restrictions Precautions Precaution Comments: pt able to recall 3/3 back precautions without cues or assistance Restrictions Weight Bearing Restrictions: No       Mobility  Bed Mobility Rolling Right: 5: Supervision Right Sidelying to Sit: 5: Supervision;HOB flat Sitting - Scoot to Edge of Bed: 5: Supervision Details for Bed Mobility Assistance: cues for sequence/technique with bed flat and no rails used Transfers Sit to Stand: 5: Supervision;From bed;With upper extremity assist Stand to Sit: 5: Supervision;To toilet;With upper extremity assist Details for Transfer Assistance: cues for hand placement for safety with transfers Ambulation/Gait Ambulation/Gait Assistance: 5: Supervision Ambulation Distance (Feet): 300 Feet Assistive device: Rolling walker Ambulation/Gait Assistance Details: min cues for posture and walker position with gait Gait Pattern: Step-through pattern;Decreased stride length;Narrow base of support Gait velocity: decreased Stairs: Yes Stairs Assistance: 4: Min assist Stairs Assistance Details (indicate cue type and  reason): 2 from garage: HHA on left with pt reaching forward (to hold "doorjamb" to ascend, bil HHA to descend x2 stairs; indoor flights: up 6 with right rail and left HHA, landing with HHA only, then up 5 with left rail and right HHA. Down  same number of stairs with no change in rail position (so opposite of going up).                   Stair Management Technique: One rail Right;One rail Left;Step to pattern;Forwards Number of Stairs: 13      PT Goals (current goals can now be found in the care plan section) Acute Rehab PT Goals Patient Stated Goal: be able to go upstairs to his bedroom each night PT Goal Formulation: With patient Time For Goal Achievement: 05/03/13 Potential to Achieve Goals: Good  Visit Information  Last PT Received On: 05/01/13 Assistance Needed: +1 History of Present Illness: 2 level fusion L4-S1; PMHx Lt rotator cuff surgery, cervical and lumbar surgery with reports of post-op Rt dorsiflexion weakness    Subjective Data  Patient Stated Goal: be able to go upstairs to his bedroom each night   Cognition  Cognition Arousal/Alertness: Awake/alert Behavior During Therapy: WFL for tasks assessed/performed Overall Cognitive Status: Within Functional Limits for tasks assessed       End of Session PT - End of Session Equipment Utilized During Treatment: Gait belt Activity Tolerance: Patient tolerated treatment well Patient left: with call bell/phone within reach;Other (comment);with family/visitor present (in bathroom for BM- nursing aware, spouse in room) Nurse Communication: Mobility status;Other (comment) (pt in bathroom with call bell)   GP     Sallyanne Kuster 05/01/2013, 1:30 PM  Sallyanne Kuster, PTA Office- [phone]

## 2013-05-01 NOTE — Progress Notes (Signed)
Postop day 2. Overall doing quite well. Minimal pain. No lower extremity pain. Patient with urinary retention yesterday with replacement of catheter. No other problems.  Awake and alert. Oriented and appropriate. Motor and sensory function intact. Dressing clean and dry. Chest and abdomen benign.  Doing well following surgery. We'll remove catheter in morning and have patient attempt to avoid again. Possible discharge tomorrow

## 2013-05-02 MED ORDER — OXYCODONE-ACETAMINOPHEN 5-325 MG PO TABS: 1.0000 | ORAL_TABLET | ORAL | Status: DC | PRN

## 2013-05-02 MED ORDER — DIAZEPAM 5 MG PO TABS: 5.0000 mg | ORAL_TABLET | Freq: Four times a day (QID) | ORAL | Status: DC | PRN

## 2013-05-02 MED ORDER — TAMSULOSIN HCL 0.4 MG PO CAPS: 0.4000 mg | ORAL_CAPSULE | Freq: Every day | ORAL | Status: DC

## 2013-05-02 NOTE — Progress Notes (Signed)
Patient received discharge instructions, handout, information about back surgery care and medications.  Patient and family were able to demonstrate and teach back.  Patient's IV was removed.  Patient was escorted to vehicle via wheelchair by RN.   Ellene Route 05/02/2013

## 2013-05-02 NOTE — Progress Notes (Signed)
Physical Therapy Treatment Patient Details Name: Danny Frederick MRN: [id number] DOB: 11-Dec-1933 Today's Date: 05/02/2013 Time: 7829-5621 PT Time Calculation (min): 23 min  PT Assessment / Plan / Recommendation  History of Present Illness 2 level fusion L4-S1; PMHx Lt rotator cuff surgery, cervical and lumbar surgery with reports of post-op Rt dorsiflexion weakness   PT Comments   Pt has progressed very well and is now overall at a supervision level for mobility. Family education completed and pt ready for d/c from a mobility perspective (with HHPT to follow to assist pt with return to independence).  Follow Up Recommendations  Home health PT;Supervision/Assistance - 24 hour     Does the patient have the potential to tolerate intense rehabilitation     Barriers to Discharge        Equipment Recommendations  Rolling walker with 5" wheels    Recommendations for Other Services    Frequency Min 5X/week   Progress towards PT Goals Progress towards PT goals: Progressing toward goals  Plan Current plan remains appropriate    Precautions / Restrictions Precautions Precautions: Back Precaution Comments: pt able to recall 3/3 back precautions without cues or assistance Restrictions Weight Bearing Restrictions: No   Pertinent Vitals/Pain Denies pain throughout session     Mobility  Bed Mobility Bed Mobility: Not assessed Transfers Transfers: Sit to Stand;Stand to Sit Sit to Stand: 6: Modified independent (Device/Increase time) Stand to Sit: 6: Modified independent (Device/Increase time) Details for Transfer Assistance: proper use of RW Ambulation/Gait Ambulation/Gait Assistance: 5: Supervision Ambulation Distance (Feet): 200 Feet Assistive device: Rolling walker Ambulation/Gait Assistance Details: vc to stay closer to RW to avoid spinal flexion Gait Pattern: Step-through pattern;Decreased stride length Gait velocity: decreased Stairs: Yes Stairs Assistance: 4: Min  guard Stairs Assistance Details (indicate cue type and reason): wife present and assisted pt; 2 with no rail and Lt HHA; 12 with 1 rail and HHA by wife Stair Management Technique: No rails;One rail Right;One rail Left;Step to pattern;Forwards Number of Stairs: 14    Exercises Other Exercises Other Exercises: Wife present throughout session. Educated both on minimizing riding in car due to risk of injury if an accident occurs. On return to pt's room, his daughter was present and inquiring re: how to assist pt up the steps. Wife able to correctly explain to her daughter. All questions answered to pt's satisfaction;   PT Diagnosis:    PT Problem List:   PT Treatment Interventions:     PT Goals (current goals can now be found in the care plan section) Acute Rehab PT Goals Patient Stated Goal: be able to go upstairs to his bedroom each night  Visit Information  Last PT Received On: 05/02/13 Assistance Needed: +1 History of Present Illness: 2 level fusion L4-S1; PMHx Lt rotator cuff surgery, cervical and lumbar surgery with reports of post-op Rt dorsiflexion weakness    Subjective Data  Patient Stated Goal: be able to go upstairs to his bedroom each night   Cognition  Cognition Arousal/Alertness: Awake/alert Behavior During Therapy: WFL for tasks assessed/performed Overall Cognitive Status: Within Functional Limits for tasks assessed    Balance     End of Session PT - End of Session Equipment Utilized During Treatment: Gait belt Activity Tolerance: Patient tolerated treatment well Patient left: in chair;with call bell/phone within reach;with family/visitor present Nurse Communication: Mobility status   GP     Amalie Koran 05/02/2013, 10:29 AM Pager [phone]

## 2013-05-02 NOTE — Progress Notes (Signed)
Pt ambulated 2xin the hallway with RN. Tolerated very well. Pt requested to d/c Foley morning. Foley d/cd at [facility]

## 2013-05-02 NOTE — Progress Notes (Signed)
05/02/13 PT recommended HHPT, spoke with patient and his wife about HH. They chose [facility] from [facility] agencies list. Dava Najjar at [facility] and set up HHPT. Contacted Darian at [facility] and requested rolling walker and 2 3N1s per patient's request. Patient to pay for 1 3N1 on his own.  Jacquelynn Cree RN, BSN, CCM

## 2013-05-02 NOTE — Discharge Summary (Signed)
Physician Discharge Summary  Patient ID: Camar Guyton MRN: [id number] DOB/AGE: 09/15/1933 79 y.o.  Admit date: 04/29/2013 Discharge date: 05/02/2013  Admission Diagnoses:  Discharge Diagnoses:  Active Problems:   * No active hospital problems. *   Discharged Condition: good  Hospital Course: Patient admitted to the hospital where he underwent an uncomplicated 2 level lumbar decompression and fusion. Postoperatively he is doing well. Preoperative back and leg pain are much improved. He is up ambulating without difficulty and is happy with his progress. Patient has had difficulty with urinary retention postop. This appears to be obstructive in nature. He's been started on Flomax. He continues to have voiding difficulties. If patient is unable to void completely prior to discharge he'll be sent home with a Foley catheter with plans for removal and urology evaluation a later date.  Consults:   Significant Diagnostic Studies:   Treatments:   Discharge Exam: Blood pressure 181/141, pulse 63, temperature 97.3 F (36.3 C), temperature source Oral, resp. rate 16, height 5\' 4"  (1.626 m), weight 65.4 kg (144 lb 2.9 oz), SpO2 100.00%. Awake and alert. Oriented and appropriate. Cranial nerve function intact. Motor and sensory function extremities normal. Wound clean and dry. Chest and abdomen benign.  Disposition: Final discharge disposition not confirmed   Future Appointments Provider Department Dept Phone   05/27/2013 2:00 PM [name], MD [facility] [phone]   06/23/2013 10:15 AM Atilano Ina, MD [facility], [place] [phone]       Medication List         aspirin 81 MG tablet  Take 81 mg by mouth daily.     diazepam 5 MG tablet  Commonly known as:  VALIUM  Take 1 tablet (5 mg total) by mouth every 6 (six) hours as needed.     Iron 325 (65 FE) MG Tabs  Take 1 tablet by mouth every other day.     lisinopril-hydrochlorothiazide 20-12.5 MG per tablet   Commonly known as:  PRINZIDE,ZESTORETIC  Take 1 tablet by mouth daily.     omeprazole 20 MG capsule  Commonly known as:  PRILOSEC  Take 1 capsule (20 mg total) by mouth every other day.     oxyCODONE-acetaminophen 5-325 MG per tablet  Commonly known as:  PERCOCET/ROXICET  Take 1-2 tablets by mouth every 4 (four) hours as needed.     SYNTHROID 50 MCG tablet  Generic drug:  levothyroxine  Take 1 tablet (50 mcg total) by mouth daily.     tamsulosin 0.4 MG Caps capsule  Commonly known as:  FLOMAX  Take 1 capsule (0.4 mg total) by mouth daily after supper.           Follow-up Information   Follow up with CABBELL,KYLE L, MD. Call in 1 week.   Specialty:  Neurosurgery   Contact information:   [address]                         [address] [place] [address] [phone]       Signed: Temple Pacini 05/02/2013, 12:31 PM

## 2013-05-03 DIAGNOSIS — M545 Low back pain, unspecified: Secondary | ICD-10-CM | POA: Diagnosis not present

## 2013-05-03 DIAGNOSIS — IMO0001 Reserved for inherently not codable concepts without codable children: Secondary | ICD-10-CM | POA: Diagnosis not present

## 2013-05-03 DIAGNOSIS — Z4789 Encounter for other orthopedic aftercare: Secondary | ICD-10-CM | POA: Diagnosis not present

## 2013-05-03 NOTE — Anesthesia Postprocedure Evaluation (Signed)
Anesthesia Post Note  Patient: Danny Frederick  Procedure(s) Performed: Procedure(s) (LRB): Lumbar Four to Five, Lumbar Five to Sacral One posterior lumbar interbody fusion with interbody prothesis posterolateral arthrodesis and posterior segmental instrumentation (N/A)  Anesthesia type: general  Patient location: PACU  Post pain: Pain level controlled  Post assessment: Patient's Cardiovascular Status Stable  Last Vitals:  Filed Vitals:   05/02/13 1437  BP: 165/86  Pulse: 76  Temp: 36.8 C  Resp: 18    Post vital signs: Reviewed and stable  Level of consciousness: sedated  Complications: No apparent anesthesia complications

## 2013-05-04 DIAGNOSIS — Z4789 Encounter for other orthopedic aftercare: Secondary | ICD-10-CM | POA: Diagnosis not present

## 2013-05-04 DIAGNOSIS — IMO0001 Reserved for inherently not codable concepts without codable children: Secondary | ICD-10-CM | POA: Diagnosis not present

## 2013-05-04 DIAGNOSIS — M545 Low back pain, unspecified: Secondary | ICD-10-CM | POA: Diagnosis not present

## 2013-05-04 MED FILL — Sodium Chloride IV Soln 0.9%: INTRAVENOUS | Qty: 3000 | Status: AC

## 2013-05-04 MED FILL — Heparin Sodium (Porcine) Inj 1000 Unit/ML: INTRAMUSCULAR | Qty: 30 | Status: AC

## 2013-05-05 ENCOUNTER — Encounter (INDEPENDENT_AMBULATORY_CARE_PROVIDER_SITE_OTHER): Payer: Medicare Other | Admitting: General Surgery

## 2013-05-06 DIAGNOSIS — Z4789 Encounter for other orthopedic aftercare: Secondary | ICD-10-CM | POA: Diagnosis not present

## 2013-05-06 DIAGNOSIS — M545 Low back pain, unspecified: Secondary | ICD-10-CM | POA: Diagnosis not present

## 2013-05-06 DIAGNOSIS — IMO0001 Reserved for inherently not codable concepts without codable children: Secondary | ICD-10-CM | POA: Diagnosis not present

## 2013-05-10 DIAGNOSIS — Z4789 Encounter for other orthopedic aftercare: Secondary | ICD-10-CM | POA: Diagnosis not present

## 2013-05-10 DIAGNOSIS — M545 Low back pain, unspecified: Secondary | ICD-10-CM | POA: Diagnosis not present

## 2013-05-10 DIAGNOSIS — IMO0001 Reserved for inherently not codable concepts without codable children: Secondary | ICD-10-CM | POA: Diagnosis not present

## 2013-05-11 DIAGNOSIS — J309 Allergic rhinitis, unspecified: Secondary | ICD-10-CM | POA: Diagnosis not present

## 2013-05-12 ENCOUNTER — Encounter (INDEPENDENT_AMBULATORY_CARE_PROVIDER_SITE_OTHER): Payer: Medicare Other | Admitting: General Surgery

## 2013-05-12 DIAGNOSIS — M545 Low back pain, unspecified: Secondary | ICD-10-CM | POA: Diagnosis not present

## 2013-05-12 DIAGNOSIS — Z4789 Encounter for other orthopedic aftercare: Secondary | ICD-10-CM | POA: Diagnosis not present

## 2013-05-12 DIAGNOSIS — IMO0001 Reserved for inherently not codable concepts without codable children: Secondary | ICD-10-CM | POA: Diagnosis not present

## 2013-05-13 ENCOUNTER — Encounter: Payer: Self-pay | Admitting: Family Medicine

## 2013-05-13 DIAGNOSIS — J309 Allergic rhinitis, unspecified: Secondary | ICD-10-CM | POA: Diagnosis not present

## 2013-05-17 DIAGNOSIS — M545 Low back pain, unspecified: Secondary | ICD-10-CM | POA: Diagnosis not present

## 2013-05-17 DIAGNOSIS — M7130 Other bursal cyst, unspecified site: Secondary | ICD-10-CM | POA: Diagnosis not present

## 2013-05-17 DIAGNOSIS — M47817 Spondylosis without myelopathy or radiculopathy, lumbosacral region: Secondary | ICD-10-CM | POA: Diagnosis not present

## 2013-05-18 DIAGNOSIS — M545 Low back pain, unspecified: Secondary | ICD-10-CM | POA: Diagnosis not present

## 2013-05-18 DIAGNOSIS — J309 Allergic rhinitis, unspecified: Secondary | ICD-10-CM | POA: Diagnosis not present

## 2013-05-18 DIAGNOSIS — Z4789 Encounter for other orthopedic aftercare: Secondary | ICD-10-CM | POA: Diagnosis not present

## 2013-05-18 DIAGNOSIS — IMO0001 Reserved for inherently not codable concepts without codable children: Secondary | ICD-10-CM | POA: Diagnosis not present

## 2013-05-20 DIAGNOSIS — J309 Allergic rhinitis, unspecified: Secondary | ICD-10-CM | POA: Diagnosis not present

## 2013-05-25 DIAGNOSIS — J309 Allergic rhinitis, unspecified: Secondary | ICD-10-CM | POA: Diagnosis not present

## 2013-05-26 ENCOUNTER — Encounter: Payer: Self-pay | Admitting: Family Medicine

## 2013-05-27 ENCOUNTER — Ambulatory Visit (INDEPENDENT_AMBULATORY_CARE_PROVIDER_SITE_OTHER): Payer: Medicare Other | Admitting: Family Medicine

## 2013-05-27 ENCOUNTER — Encounter: Payer: Self-pay | Admitting: Family Medicine

## 2013-05-27 VITALS — BP 150/84 | HR 68 | Ht 63.5 in | Wt 133.0 lb

## 2013-05-27 DIAGNOSIS — K219 Gastro-esophageal reflux disease without esophagitis: Secondary | ICD-10-CM

## 2013-05-27 DIAGNOSIS — Z1211 Encounter for screening for malignant neoplasm of colon: Secondary | ICD-10-CM

## 2013-05-27 DIAGNOSIS — I10 Essential (primary) hypertension: Secondary | ICD-10-CM | POA: Diagnosis not present

## 2013-05-27 DIAGNOSIS — Z0000 Encounter for general adult medical examination without abnormal findings: Secondary | ICD-10-CM | POA: Diagnosis not present

## 2013-05-27 DIAGNOSIS — D509 Iron deficiency anemia, unspecified: Secondary | ICD-10-CM

## 2013-05-27 DIAGNOSIS — E039 Hypothyroidism, unspecified: Secondary | ICD-10-CM

## 2013-05-27 LAB — HEMOCCULT GUIAC POC 1CARD (OFFICE)

## 2013-05-27 MED ORDER — OMEPRAZOLE 20 MG PO CPDR: 20.0000 mg | DELAYED_RELEASE_CAPSULE | ORAL | Status: DC

## 2013-05-27 MED ORDER — LISINOPRIL-HYDROCHLOROTHIAZIDE 20-12.5 MG PO TABS: 1.0000 | ORAL_TABLET | Freq: Every day | ORAL | Status: DC

## 2013-05-27 MED ORDER — LEVOTHYROXINE SODIUM 50 MCG PO TABS: 50.0000 ug | ORAL_TABLET | Freq: Every day | ORAL | Status: DC

## 2013-05-27 NOTE — Progress Notes (Signed)
Chief Complaint  Patient presents with  . Med check plus    nonfasting med check plus. Has no concerns today. Would however like to briefly discuss some emotional issues with his wife if that is possible.    Danny Frederick is a 79 y.o. male who presents for a med check and Annual Wellness visit.  He had his labs done in July.  He has the following concerns:  Concerned about his wife's anxiety.  Seems to be getting worse, she is "turning into her mother".   Hypertension follow-up: Blood pressures elsewhere are 108-158/64-90, mostly running 130/80. Denies dizziness, headaches, chest pain. Denies side effects of medications.   GERD: takes omeprazole every other day with good results. Had recurrent symptoms if he went every 3 days. He has h/o iron deficiency, and he continues to take iron every other day on the days he doesn't take the omeprazole.   Hypothyroidism:  Denies any symptoms--no changes in energy, weight, moods, hair/skin/bowels.  He lost some weight related to recent surgery  Back pain:  He recently had spinal fusion, and pain is much better.  Doing well overall.  He also had hemorrhoid surgery recently--unfortunately his main symptoms of anal itching persists. Denies any bleeding or pain.   AWV:   Other doctors involved in patient's care: Neurosurgeon:  Dr. Franky Macho General surgeon (hemorrhoids): Dr. Andrey Campanile Ophtho: [facility] GI:  Dr. Gerilyn Pilgrim Dentist -- can't recall name Dr. Maren Reamer at [facility] (audiologist)   Immunization History  Administered Date(s) Administered  . Influenza Split 06/13/2011  . Pneumococcal Conjugate 09/02/2003  . Tdap 10/02/2005  . Zoster 03/11/2005   Last colonoscopy: 12/09 Last PSA: July Dentist: not regularly, but did go this year Ophtho: yearly Exercise:  Limited due to recent back surgery.  Previously went to gym 4-5 x/week and walks daily. Currently is up to walking 30 minutes daily after back surgery  End of Life issues:  Has living will  and healthcare power of attorney.  Depression screen:  Negative--see scanned sheet ADL screen:  Notable for hearing loss (uses aid), using cane since his back surgery.  No falls since 12/2011.  Past Medical History  Diagnosis Date  . Hypertension   . Unspecified hypothyroidism   . Mitral valve prolapse     h/o; normal echo 12/2011 with no MVP seen  . Foot drop, right   . BPH (benign prostatic hypertrophy)   . Iron deficiency anemia, unspecified 6/09  . Hearing loss in left ear   . Diverticulosis of colon 12/09  . Internal hemorrhoids   . GERD (gastroesophageal reflux disease)   . Allergic rhinitis, cause unspecified     on allergy shots (Dr. Barnetta Chapel)  . Allergic conjunctivitis     Past Surgical History  Procedure Laterality Date  . Prostate surgery  2007    photovaporization  . Cervical laminectomy  1972    C5-6  . Spine surgery  2006    L4-5 disk surgery  . Rotator cuff repair  10/2008    left; Dr. Rennis Chris  . Esophagogastroduodenoscopy  10/31/08    normal; Dr. Gerilyn Pilgrim  . Tonsillectomy    . Hemorroidal banding  04/07/2013    x3-Dr.Eric Andrey Campanile  . Hemorrhoidectomy with hemorrhoid banding  04/07/13  . Spine surgery  04/2013    L4-5, L5-S1 fusion.  Dr. Franky Macho    History   Social History  . Marital Status: Married    Spouse Name: N/A    Number of Children: 2  . Years of Education:  N/A   Occupational History  . Not on file.   Social History Main Topics  . Smoking status: Former Smoker    Types: Cigarettes    Quit date: 09/01/1977  . Smokeless tobacco: Never Used  . Alcohol Use: Yes     Comment: 1-2 glasses wine per day  . Drug Use: No  . Sexual Activity: Not on file   Other Topics Concern  . Not on file   Social History Narrative   Lives with wife, pet rats (new)    Family History  Problem Relation Age of Onset  . Diabetes Mother   . Heart disease Mother     CHF  . Stroke Father   . Parkinsonism Father   . Cancer Brother     bladder cancer  . Cancer  Brother     prostate cancer  . Emphysema Brother   . Diabetes Sister   . Colon cancer Neg Hx     Current outpatient prescriptions:aspirin 81 MG tablet, Take 81 mg by mouth daily.  , Disp: , Rfl: ;  Ferrous Sulfate (IRON) 325 (65 FE) MG TABS, Take 1 tablet by mouth every other day., Disp: , Rfl: ;  lisinopril-hydrochlorothiazide (PRINZIDE,ZESTORETIC) 20-12.5 MG per tablet, Take 1 tablet by mouth daily., Disp: 90 tablet, Rfl: 3;  omeprazole (PRILOSEC) 20 MG capsule, Take 1 capsule (20 mg total) by mouth every other day., Disp: 90 capsule, Rfl: 1 oxyCODONE-acetaminophen (PERCOCET/ROXICET) 5-325 MG per tablet, Take 1-2 tablets by mouth every 4 (four) hours as needed., Disp: 80 tablet, Rfl: 0;  levothyroxine (LEVOXYL) 50 MCG tablet, Take 1 tablet (50 mcg total) by mouth daily before breakfast., Disp: 90 tablet, Rfl: 3  No Known Allergies  ROS:  The patient denies anorexia, fever, headaches,  vision loss, ear pain, hoarseness, chest pain, palpitations, dizziness, syncope, dyspnea on exertion, cough, swelling, nausea, vomiting, diarrhea, constipation, abdominal pain, melena, hematochezia, indigestion/heartburn, hematuria, incontinence, erectile dysfunction, nocturia, weakened urine stream, dysuria, genital lesions, joint pains, numbness, tingling, weakness, tremor, suspicious skin lesions, depression, anxiety, abnormal bleeding/bruising, or enlarged lymph nodes  Hearing unchanged--L ear, uses aid Some recent constipation due to narcotics, relieved by Miralax Back pain is much improved--only needing 1 hydrocodone pill daily, plus tylenol.   PHYSICAL EXAM: BP 150/84  Pulse 68  Ht 5' 3.5" (1.613 m)  Wt 133 lb (60.328 kg)  BMI 23.19 kg/m2  General Appearance:    Alert, cooperative, no distress, appears stated age  Head:    Normocephalic, without obvious abnormality, atraumatic  Eyes:    PERRL, conjunctiva/corneas clear, EOM's intact, fundi    benign  Ears:    Normal TM's and external ear canals   Nose:   Nares normal, mucosa normal, no drainage or sinus   tenderness  Throat:   Lips, mucosa, and tongue normal; upper dentures  Neck:   Supple, no lymphadenopathy;  thyroid:  no   enlargement/tenderness/nodules; no carotid   bruit or JVD  Back:    Spine nontender, no curvature, no CVA tenderness. WHSS midline spine, with some scab, healing well without infection  Lungs:     Clear to auscultation bilaterally without wheezes, rales or     ronchi; respirations unlabored  Chest Wall:    No tenderness or deformity   Heart:    Regular rate and rhythm, S1 and S2 normal, no murmur, rub   or gallop  Breast Exam:    No chest wall tenderness, masses or gynecomastia  Abdomen:     Soft, non-tender, nondistended,  normoactive bowel sounds,    no masses, no hepatosplenomegaly  Genitalia:    Normal male external genitalia without lesions.  Testicles without masses.  No inguinal hernias.  Rectal:    Normal sphincter tone, no masses or tenderness; guaiac negative stool.  Prostate smooth, no nodules, not enlarged.  Extremities:   No clubbing, cyanosis or edema  Pulses:   2+ and symmetric all extremities  Skin:   Skin color, texture, turgor normal, no rashes or lesions. Small AK anterior scalp on right  Lymph nodes:   Cervical, supraclavicular, and axillary nodes normal  Neurologic:   CNII-XII intact, normal strength, sensation and gait; reflexes 2+ and symmetric throughout          Psych:   Normal mood, affect, hygiene and grooming.      Chemistry      Component Value Date/Time   NA 134* 04/21/2013 1046   K 4.0 04/21/2013 1046   CL 97 04/21/2013 1046   CO2 27 04/21/2013 1046   BUN 17 04/21/2013 1046   CREATININE 1.10 04/21/2013 1046   CREATININE 1.15 03/07/2013 1211      Component Value Date/Time   CALCIUM 10.1 04/21/2013 1046   ALKPHOS 60 03/07/2013 1211   AST 30 03/07/2013 1211   ALT 16 03/07/2013 1211   BILITOT 0.6 03/07/2013 1211     Lab Results  Component Value Date   WBC 6.6 04/21/2013   HGB 15.1  04/21/2013   HCT 42.0 04/21/2013   MCV 80.5 04/21/2013   PLT 276 04/21/2013   Lab Results  Component Value Date   TSH 1.454 03/07/2013   Lab Results  Component Value Date   PSA 1.39 03/07/2013   PSA 1.36 12/25/2011   Lab Results  Component Value Date   CHOL 162 01/09/2012   HDL 60 01/09/2012   LDLCALC 93 01/09/2012   TRIG 47 01/09/2012   CHOLHDL 2.7 01/09/2012    ASSESSMENT/PLAN:  Essential hypertension, benign - overall controlled per home numbers - Plan: lisinopril-hydrochlorothiazide (PRINZIDE,ZESTORETIC) 20-12.5 MG per tablet  Unspecified hypothyroidism - normal TSH 2 mos ago.  Switch back to Levoxyl (he previously took changed when unavailable; less expensive) - Plan: levothyroxine (LEVOXYL) 50 MCG tablet  GERD (gastroesophageal reflux disease) - well controlled with diet, qod PPI - Plan: omeprazole (PRILOSEC) 20 MG capsule  Iron deficiency anemia, unspecified - resolved/stable with qod OTC iron  Medicare annual wellness visit, initial - Plan: Hemoccult - 1 Card (office)  Full Code, Full Care. Doesn't want extraordinary efforts if no chance of recovery. Has documents at home and on file at hospital.  Discussed PSA screening (risks/benefits)--already done and normal, recommended at least 30 minutes of aerobic activity at least 5 days/week; proper sunscreen use reviewed; healthy diet and alcohol recommendations (less than or equal to 2 drinks/day) reviewed; regular seatbelt use; changing batteries in smoke detectors.  Immunization recommendations discussed--high dose flu shot recommended, not available today.  Will return when available.  Colonoscopy recommendations reviewed, UTD

## 2013-05-27 NOTE — Patient Instructions (Signed)
HEALTH MAINTENANCE RECOMMENDATIONS:  It is recommended that you get at least 30 minutes of aerobic exercise at least 5 days/week (for weight loss, you may need as much as 60-90 minutes). This can be any activity that gets your heart rate up. This can be divided in 10-15 minute intervals if needed, but try and build up your endurance at least once a week.  Weight bearing exercise is also recommended twice weekly.  Eat a healthy diet with lots of vegetables, fruits and fiber.  "Colorful" foods have a lot of vitamins (ie green vegetables, tomatoes, red peppers, etc).  Limit sweet tea, regular sodas and alcoholic beverages, all of which has a lot of calories and sugar.  Up to 2 alcoholic drinks daily may be beneficial for men (unless trying to lose weight, watch sugars).  Drink a lot of water.  Sunscreen of at least SPF 30 should be used on all sun-exposed parts of the skin when outside between the hours of 10 am and 4 pm (not just when at beach or pool, but even with exercise, golf, tennis, and yard work!)  Use a sunscreen that says "broad spectrum" so it covers both UVA and UVB rays, and make sure to reapply every 1-2 hours.  Remember to change the batteries in your smoke detectors when changing your clock times in the spring and fall.  Use your seat belt every time you are in a car, and please drive safely and not be distracted with cell phones and texting while driving.  Please check to see if insurance will cover Prevnar 13 or pneumovax.  These are pneumonia shots.  You have had a pneumovax back in 2005.  It is recommended that you now have a Prevnar, but I'm not sure if it would be covered.  Return for high dose flu shot when available (you should be called)

## 2013-06-01 DIAGNOSIS — J309 Allergic rhinitis, unspecified: Secondary | ICD-10-CM | POA: Diagnosis not present

## 2013-06-03 ENCOUNTER — Telehealth: Payer: Self-pay | Admitting: Family Medicine

## 2013-06-03 NOTE — Telephone Encounter (Signed)
I believe that he had been getting branded Synthroid prior.  I thought that Levoxyl was now available (is it not?) so was switching back to what he used to take.  I prefer brand over generic, but feel free to check with patient.

## 2013-06-06 ENCOUNTER — Telehealth: Payer: Self-pay | Admitting: *Deleted

## 2013-06-06 NOTE — Telephone Encounter (Signed)
Spoke with patient and he is okay with brand name Synthroid, will fax form back to [facility].

## 2013-06-07 DIAGNOSIS — J309 Allergic rhinitis, unspecified: Secondary | ICD-10-CM | POA: Diagnosis not present

## 2013-06-09 ENCOUNTER — Other Ambulatory Visit (INDEPENDENT_AMBULATORY_CARE_PROVIDER_SITE_OTHER): Payer: Medicare Other

## 2013-06-09 DIAGNOSIS — Z23 Encounter for immunization: Secondary | ICD-10-CM | POA: Diagnosis not present

## 2013-06-09 NOTE — Telephone Encounter (Signed)
done

## 2013-06-10 ENCOUNTER — Telehealth: Payer: Self-pay | Admitting: *Deleted

## 2013-06-10 ENCOUNTER — Encounter: Payer: Self-pay | Admitting: Family Medicine

## 2013-06-10 ENCOUNTER — Ambulatory Visit (INDEPENDENT_AMBULATORY_CARE_PROVIDER_SITE_OTHER): Payer: Medicare Other | Admitting: Family Medicine

## 2013-06-10 VITALS — BP 142/82 | HR 72 | Ht 63.5 in | Wt 133.0 lb

## 2013-06-10 DIAGNOSIS — R350 Frequency of micturition: Secondary | ICD-10-CM | POA: Diagnosis not present

## 2013-06-10 DIAGNOSIS — Z79899 Other long term (current) drug therapy: Secondary | ICD-10-CM

## 2013-06-10 DIAGNOSIS — I10 Essential (primary) hypertension: Secondary | ICD-10-CM | POA: Diagnosis not present

## 2013-06-10 DIAGNOSIS — E039 Hypothyroidism, unspecified: Secondary | ICD-10-CM | POA: Diagnosis not present

## 2013-06-10 LAB — POCT URINALYSIS DIPSTICK
Bilirubin, UA: NEGATIVE
Leukocytes, UA: NEGATIVE
Nitrite, UA: NEGATIVE
Protein, UA: NEGATIVE
pH, UA: 5

## 2013-06-10 NOTE — Progress Notes (Signed)
Chief Complaint  Patient presents with  . Urinary Frequency    over the last 2-3 months he has had urinary frequency, dry mouth and hard stools. He states his body feels like it doesn't have any fluid in it. He is concerned about diabetes.    Urinary frequency and larger volumes of voids over the last 2-3 months.  He doesn't think fluid intake changed prior to noticing this increase in voiding, but has since increased his fluid intake to try and avoid dehydration.  Urine stream is strong, empties well, feels completely different from prostate issues (he has had in past).  He is having daily bowel movements, but in the last few days they have been "as hard as a rock".  He has been on iron every other day for quite a while, with no change in brand or dose. He recently restarted Miralax (previously used when on hydrocodone).  He is also taking stool softeners (for weeks) and metamucil daily.  He has been doing these two later things for a few weeks, but bowel change was just recent, so added the miralax.  His mouth has been very dry.  Denies excessive thirst.  Denies any edema.  Skin is dry.  He drinks 3 cups of tea daily (unchanged) and 2 glasses of wine daily.  He doesn't take any antihistamines, no longer taking any pain medication.  His BP meds (containing HCTZ) have not changed.  His thyroid dose hasn't changed--we tried changing him back to Levoxyl, but his pharmacy didn't have it, so he has been maintained on branded Synthroid, at same dose, no missed pills, and TSH was normal in July (prior to these change in symptoms.  Review of chart--sugars have all been normal (checked twice in the last 3 months).  Past Medical History  Diagnosis Date  . Hypertension   . Unspecified hypothyroidism   . Mitral valve prolapse     h/o; normal echo 12/2011 with no MVP seen  . Foot drop, right   . BPH (benign prostatic hypertrophy)   . Iron deficiency anemia, unspecified 6/09  . Hearing loss in left ear   .  Diverticulosis of colon 12/09  . Internal hemorrhoids   . GERD (gastroesophageal reflux disease)   . Allergic rhinitis, cause unspecified     on allergy shots (Dr. Barnetta Chapel)  . Allergic conjunctivitis    Past Surgical History  Procedure Laterality Date  . Prostate surgery  2007    photovaporization  . Cervical laminectomy  1972    C5-6  . Spine surgery  2006    L4-5 disk surgery  . Rotator cuff repair  10/2008    left; Dr. Rennis Chris  . Esophagogastroduodenoscopy  10/31/08    normal; Dr. Gerilyn Pilgrim  . Tonsillectomy    . Hemorroidal banding  04/07/2013    x3-Dr.Eric Andrey Campanile  . Hemorrhoidectomy with hemorrhoid banding  04/07/13  . Spine surgery  04/2013    L4-5, L5-S1 fusion.  Dr. Franky Macho   History   Social History  . Marital Status: Married    Spouse Name: N/A    Number of Children: 2  . Years of Education: N/A   Occupational History  . Not on file.   Social History Main Topics  . Smoking status: Former Smoker    Types: Cigarettes    Quit date: 09/01/1977  . Smokeless tobacco: Never Used  . Alcohol Use: Yes     Comment: 1-2 glasses wine per day  . Drug Use: No  . Sexual  Activity: Not on file   Other Topics Concern  . Not on file   Social History Narrative   Lives with wife, pet rats (new)   Current outpatient prescriptions:aspirin 81 MG tablet, Take 81 mg by mouth daily.  , Disp: , Rfl: ;  docusate sodium (COLACE) 100 MG capsule, Take 300 mg by mouth daily., Disp: , Rfl: ;  Ferrous Sulfate (IRON) 325 (65 FE) MG TABS, Take 1 tablet by mouth every other day., Disp: , Rfl: ;  lisinopril-hydrochlorothiazide (PRINZIDE,ZESTORETIC) 20-12.5 MG per tablet, Take 1 tablet by mouth daily., Disp: 90 tablet, Rfl: 3 omeprazole (PRILOSEC) 20 MG capsule, Take 1 capsule (20 mg total) by mouth every other day., Disp: 90 capsule, Rfl: 1;  polyethylene glycol (MIRALAX / GLYCOLAX) packet, Take 17 g by mouth daily., Disp: , Rfl: ;  psyllium (REGULOID) 0.52 G capsule, Take 0.52 g by mouth daily., Disp: ,  Rfl: ;  SYNTHROID 50 MCG tablet, Take 50 mcg by mouth daily before breakfast. , Disp: , Rfl:   No Known Allergies  PHYSICAL EXAM: BP 142/82  Pulse 72  Ht 5' 3.5" (1.613 m)  Wt 133 lb (60.328 kg)  BMI 23.19 kg/m2 Well developed, pleasant male in no distress HEENT:  PERRL, conjunctiva clear.  Mucus membranes moist Neck: no lymphadenopathy Heart: regular rate and rhythm Lungs: clear bilaterally Back: no CVA tenderness Abdomen: soft, nontender Extremities: no edema Skin: no rashes  ASSESSMENT/PLAN:  Urinary frequency - Plan: POCT Urinalysis Dipstick  Unspecified hypothyroidism - Plan: TSH  Encounter for long-term (current) use of other medications - Plan: TSH, Basic metabolic panel  Essential hypertension, benign - Plan: Basic metabolic panel  b-met and TSH (pt is not fasting)  Continue to drink plenty of water.  Consider cutting back on caffeine and alcohol (as these cause you to urinary more frequently, but don't help hydrate you). Consider changing blood pressure medications if this remains a problem--the HCTZ portion is a diuretic, and might be contributing some. We will check thyroid and make sure that dose is still correct--it was normal 3 months ago, but you weren't having symptoms then. We will also check electrolytes and kidney function, also looks at whether or not you seem dehydrated.  Urine tests are normal--no infection or sign of kidney disease. I do not believe you have diabetes--no sugar in the urine, and all recent blood tests have been normal.  Continue the miralax as needed, and 200-300mg  of stool softener (colace) daily

## 2013-06-10 NOTE — Telephone Encounter (Signed)
Spoke with patient and the 3 medications on the reconciliation list, diazepam, hydrocodone and tamsulosin were given to him after his back surgery. He only took the tamsulosin for a few days and then discontinued.

## 2013-06-10 NOTE — Patient Instructions (Signed)
Continue to drink plenty of water.  Consider cutting back on caffeine and alcohol (as these cause you to urinary more frequently, but don't help hydrate you). Consider changing blood pressure medications if this remains a problem--the HCTZ portion is a diuretic, and might be contributing some. We will check thyroid and make sure that dose is still correct--it was normal 3 months ago, but you weren't having symptoms then. We will also check electrolytes and kidney function, also looks at whether or not you seem dehydrated.  Urine tests are normal--no infection or sign of kidney disease. I do not believe you have diabetes--no sugar in the urine, and all recent blood tests have been normal.  Continue the miralax as needed, and 200-300mg  of stool softener (colace) daily

## 2013-06-11 LAB — BASIC METABOLIC PANEL
BUN: 22 mg/dL (ref 6–23)
Creat: 1.16 mg/dL (ref 0.50–1.35)

## 2013-06-13 ENCOUNTER — Telehealth: Payer: Self-pay | Admitting: Family Medicine

## 2013-06-13 NOTE — Telephone Encounter (Signed)
Patient informed, he will call and schedule NV.

## 2013-06-13 NOTE — Telephone Encounter (Signed)
Ditto for what I recommend for his wife.  Has been about 10 years since last pneumovax.  I recommend Prevnar. He can come for NV (recommended that wife set up CPE)

## 2013-06-13 NOTE — Telephone Encounter (Signed)
Pt states that his insurance will not pay for him to have pneu shot unless his doctor states that it is medically necessary. Pt would like to know if you would authorize this as medically necessary. Please inform pt.

## 2013-06-14 DIAGNOSIS — J309 Allergic rhinitis, unspecified: Secondary | ICD-10-CM | POA: Diagnosis not present

## 2013-06-15 ENCOUNTER — Other Ambulatory Visit (INDEPENDENT_AMBULATORY_CARE_PROVIDER_SITE_OTHER): Payer: Medicare Other

## 2013-06-15 ENCOUNTER — Telehealth: Payer: Self-pay | Admitting: Internal Medicine

## 2013-06-15 ENCOUNTER — Encounter: Payer: Self-pay | Admitting: Family Medicine

## 2013-06-15 DIAGNOSIS — Z23 Encounter for immunization: Secondary | ICD-10-CM | POA: Diagnosis not present

## 2013-06-16 ENCOUNTER — Telehealth: Payer: Self-pay | Admitting: *Deleted

## 2013-06-16 MED ORDER — LISINOPRIL 30 MG PO TABS: 30.0000 mg | ORAL_TABLET | Freq: Every day | ORAL | Status: DC

## 2013-06-16 NOTE — Telephone Encounter (Signed)
Spoke with patient, phoned in lisinopril 30mg  #30 to [facility] and scheduled him for 07/14/13 for follow up.

## 2013-06-16 NOTE — Telephone Encounter (Signed)
error 

## 2013-06-23 ENCOUNTER — Ambulatory Visit (INDEPENDENT_AMBULATORY_CARE_PROVIDER_SITE_OTHER): Payer: Medicare Other | Admitting: General Surgery

## 2013-06-23 ENCOUNTER — Encounter (INDEPENDENT_AMBULATORY_CARE_PROVIDER_SITE_OTHER): Payer: Self-pay | Admitting: General Surgery

## 2013-06-23 ENCOUNTER — Encounter (INDEPENDENT_AMBULATORY_CARE_PROVIDER_SITE_OTHER): Payer: Medicare Other | Admitting: General Surgery

## 2013-06-23 VITALS — BP 125/80 | HR 77 | Temp 98.6°F | Resp 12 | Ht 63.0 in | Wt 133.2 lb

## 2013-06-23 DIAGNOSIS — L290 Pruritus ani: Secondary | ICD-10-CM

## 2013-06-23 DIAGNOSIS — J309 Allergic rhinitis, unspecified: Secondary | ICD-10-CM | POA: Diagnosis not present

## 2013-06-23 NOTE — Progress Notes (Signed)
Subjective:     Patient ID: Raedyn Wenke, male   DOB: [birth year], 79 y.o.   MRN: [id number]  HPI  79 year old Caucasian male comes in for long-term followup after undergoing exam under anesthesia, hemorrhoidal banding x3 on August 7. I last saw in the office at the end of August. Since that time he has had back surgery. He states that overall he is doing well. He did have some return of perianal itching. He increased his fiber to twice a day and the itching has significantly improved. He is still drinking tea. He denies any pain with defecation. He reports daily bowel movements. He is still using wet lites. He denies any bleeding. He will use hydrocortisone ointment occasionally which will essentially resolve the itching. He only has the itching when having a bowel movement. He denies any straining.  PMHx, PSHx, SOCHx, FAMHx, ALL reviewed   Review of Systems 6 point ROS performed and negative except for what is mentioned above    Objective:   Physical Exam BP 125/80  Pulse 77  Temp(Src) 98.6 F (37 C) (Temporal)  Resp 12  Ht 5\' 3"  (1.6 m)  Wt 133 lb 3.2 oz (60.419 kg)  BMI 23.6 kg/m2  Gen: alert, NAD, non-toxic appearing Pupils: equal, no scleral icterus Abd: soft, nontender, nondistended.  Ext: no edema,  Skin: no rash, no jaundice Rectal: some hyperpigmentation/irritation of perianal skin extending about 2cm from AV from 3-9 o'clock. Good tone. Anoscopy - small grade 1 int hemorrhoid l lateral position; o/w no signif hemorrhoidal burden     Assessment:     Status post exam under anesthesia, hemorrhoidal banding x3 Pruritus ani     Plan:     Overall I think he is doing well. There is no residual significant hemorrhoidal burden. With respect to his pruritus ani we rediscussed techniques to help with the itching. He was given Agricultural engineer. I encouraged the use of wet wipes. We discussed the importance of getting 25 g a day of fiber in his diet. We also discussed keeping  the area dry. We also discussed using wet wipes. We also discussed avoiding harsh rubbing. I told him he also use hydrocortisone ointment as needed. Followup when necessary  Mary Sella. Andrey Campanile, MD, FACS General, Bariatric, & Minimally Invasive Surgery [facility], [place]

## 2013-06-23 NOTE — Patient Instructions (Signed)
What is Pruritus Ani (proo-r-tus a-n)? Itching around the anal area is called pruritus ani. This condition results in a compelling urge to scratch. What causes this to happen? Several factors may be at fault. A common cause is excessive moisture in the anal area. Moisture may be due to perspiration or a small amount of residual stool around the anal area. Pruritis ani may be a symptom of other common anal conditions such as hemorrhoids and anal fissures. The initial condition can be made worse by scratching, vigorous cleansing of the area or overuse of topical treatments. In some individuals pruritus ani may be caused by eating certain foods, smoking and drinking alcoholic beverages, especially beer and wine. Food items that have been associated with pruritus ani include: . Coffee, Tea . Carbonated beverages . Milk products . Tomatoes and tomato products such as Ketchup, spicey foods . Chocolate and Nuts  Idiopathic In over 75% of cases of pruritus, the cause of the pruritus is not known (i.e. idiopathic pruritus). Dermatitis Skin conditions such as dermatitis or psoriasis also can irritate the anus and result in anal pruritus. These may respond to corticosteroid creams.  Moisture Moisture due to excessive perspiration, frequent liquid stools (diarrhoea), or a degree of faecal incontinence where there is a weak anal sphincter leading to seepage can exacerbate this condition. Moisture can also result from an abnormal passageway communicating between the anus and external skin (anal fistula). A fistula brings contaminated and irritating fluids to the anal area. Moisture can also result from excessive mucous discharge, a common problem with haemorrhoids and rectal prolapse, where the mucous secreting mucosa of the anus drops below the anal sphincter (prolapses) . Infections Infection with pinworm is common in those with young children and household pets. Less common is infestation with scabies or  mites. These can all be tested for with skin scrapings or the "sellotape test" which are then sent off for viewing under microscopy. Yeast or fungal infections may occur if there is moisture around the anus. They more often occur in people who are immune-compromised including diabetics, transplant recipients, those taking chemotherapy, and those with HIV. Anal Cancer Anal cancer is uncommon, as are precancerous lesions (Bowen's and Paget's disease). However, when present, they may first present as a perianal itch. It is therefore important for your colorectal surgeon to examine the area, and on occasions a biopsy if required to exclude anal cancer.  Does Pruritus Ani result from lack of cleanliness? Cleanliness is almost never a factor. However, the natural tendency once a person develops this itching is to wash the area vigorously and frequently with soap and a washcloth. This almost always makes the problem worse by damaging the skin and washing away protective natural oils. What can be done to make this itching go away? A careful examination by a colon and rectal surgeon or other physician may identify a definite cause for the itching. Your physician can recommend treatment to eliminate the specific problem. Treatment of pruritus ani may include these three points. 1. AVOID MOISTURE in the anal area: . Apply either a few wisps of cotton, a 4 x 4 gauze or some cornstarch powder to keep the area dry. . Avoid all medicated, perfumed and deodorant powders. 2. AVOID FURTHER TRAUMA to the affected area: Marland Kitchen Do not use soap of any kind on the anal area. . Do not scrub the anal area with anything - even toilet paper. . For hygiene, it is best to rinse with warm water and pat the  area dry. Use wet toilet paper, baby wipes or a wet washcloth to blot the area clean. Never rub. . Try not to scratch the itchy area. Scratching produces more damage, which in turn makes the itching worse. For individuals that  experience irresistible itching at night, wearing socks on the hands may be helpful. 3. USE ONLY MEDICATIONS AS DIRECTED BY YOUR PHYSICIAN. Apply prescription medications sparingly to the skin around the anal area and avoid rubbing. Prolonged use of prescribed or over the counter topical medications may result in irritation or skin dryness that can make the condition worse. How long does this treatment usually take? Most people experience some relief from itching within a week. If symptoms do not resolve after 6 weeks, a follow-up appointment with your colon and rectal surgeon may be needed.             TREATMENT Management must be directed at breaking the "itch-scratch-itch" cycle as well as identifying causes and irritants and treating or avoiding these. It is important to clean and dry the anus thoroughly and avoid leaving soap in the anal area. Cleaning efforts should include gentle showering without direct rubbing or irritation of the skin with either the washcloth or towel. After bowel movements, wet cleaning of the perianal region either with a bidet or with moist wipes may be preferable to toilet paper. Scratching the affected area is to be resisted, as it only aggravates the problem and can lead to bleeding from the anal area. Synthetic underwear should be avoided. Irritant washing powders can also aggravate the problem. A gauze pad or combine, folded in half and placed between the buttocks so that it is in close proximity to the anus, is an effective way of reducing moisture to the region. BABY WIPES OR BIDET Baby wipes may be preferable to abrasive toilet paper and can help reduce friction, however perfumed baby wipes should be avoided. The [place] bidet used to wash the anal region after a bowel movement is an alternative to baby wipes. The conventional toilet can also have a bidet appliance attached to it. BULKING AGENTS Anal pruritus is often exacerbated by watery stools. A tablespoon or  sachet of ispaghula husk (Metamucil or Fibogel) twice a day, can firm loose stools. TOPICAL CREAMS AND OINTMENTS There are many over-the-counter creams or ointments that can be applied to the anus to reduce itch. Most of these creams have a barrier compound such as petroleum jelly (Vasoline) or zinc oxide that acts as a protectant and should be applied as a thin film to avoid excessive moisture. In addition they usually contain a small amount of one or more active ingredients. The active ingredients include an antiseptic (chlorhexidine), a local anaesthetic agents (lignocaine, benzocaine, cinchocaine) that numb the area, corticosteroids (hydrocortisone, fluocortolone, prednisolone) that reduce inflammation in the area, and vasoconstrictors (adrenaline) that make the blood vessels in the area become smaller, which may reduce swelling and help dry the area. Most creams just contain a corticosteroid with a local anaesthetic (Proctosedyl, Rectinol HC, Scheriproct, Ultraproct), others contain a vasoconstrictor with local anaesthetic (Rectinol). Some creams have all four active ingredients. ANTIHISTAMINES Antihistamines have been shown to reduce itch[1]. However most are sedative, and are best taken in the evening. CORTICOSTEROID CREAMS Stronger 1% corticosteroid ointments containing hydrocortisone (Egorcort Sigmacort) betamethasone (Diprosone) may be obtained with a prescription, and have been shown to reduce inflammation and relieve itching [2-3]. They should not be used long term (i.e. more than a few days to two weeks), as chronic  use can cause permanent damage to the skin.

## 2013-06-27 DIAGNOSIS — M545 Low back pain, unspecified: Secondary | ICD-10-CM | POA: Diagnosis not present

## 2013-06-29 DIAGNOSIS — J309 Allergic rhinitis, unspecified: Secondary | ICD-10-CM | POA: Diagnosis not present

## 2013-07-06 DIAGNOSIS — M545 Low back pain, unspecified: Secondary | ICD-10-CM | POA: Diagnosis not present

## 2013-07-06 DIAGNOSIS — J309 Allergic rhinitis, unspecified: Secondary | ICD-10-CM | POA: Diagnosis not present

## 2013-07-13 DIAGNOSIS — M545 Low back pain, unspecified: Secondary | ICD-10-CM | POA: Diagnosis not present

## 2013-07-13 DIAGNOSIS — J309 Allergic rhinitis, unspecified: Secondary | ICD-10-CM | POA: Diagnosis not present

## 2013-07-14 ENCOUNTER — Encounter: Payer: Self-pay | Admitting: Family Medicine

## 2013-07-14 ENCOUNTER — Ambulatory Visit (INDEPENDENT_AMBULATORY_CARE_PROVIDER_SITE_OTHER): Payer: Medicare Other | Admitting: Family Medicine

## 2013-07-14 VITALS — BP 130/72 | HR 72 | Ht 63.5 in | Wt 138.0 lb

## 2013-07-14 DIAGNOSIS — Z79899 Other long term (current) drug therapy: Secondary | ICD-10-CM

## 2013-07-14 DIAGNOSIS — I10 Essential (primary) hypertension: Secondary | ICD-10-CM

## 2013-07-14 MED ORDER — LISINOPRIL 30 MG PO TABS: 30.0000 mg | ORAL_TABLET | Freq: Every day | ORAL | Status: DC

## 2013-07-14 NOTE — Patient Instructions (Signed)
Continue your current medications. Return sooner if you develop recurrent urinary symptoms, or if BP's are consistently >140/90.

## 2013-07-14 NOTE — Progress Notes (Signed)
Chief Complaint  Patient presents with  . Hypertension    4 week blood pressure follow up. Recheck BMET and patient did bring a list of bp readings. All med reconciled that he is currently taking.   We changed his BP med from lisinopril HCTZ to lisinopril 30mg  after problems with increasing urinary frequency and hard stools.  Symptoms have improved--he only got up once at night for the last few nights (had been getting up 3 times).  "everything has calmed down"--also no longer having as frequent urination during the day.  Stools are softer, and is feeling better.  He increased his metamucil to twice daily.  BP's are running 98-153/68-92, pulse 64-81.  BP mainly running 120-140/80, lower after exercise.  He denies any edema, shortness of breath or other concerns.  Past Medical History  Diagnosis Date  . Hypertension   . Unspecified hypothyroidism   . Mitral valve prolapse     h/o; normal echo 12/2011 with no MVP seen  . Foot drop, right   . BPH (benign prostatic hypertrophy)   . Iron deficiency anemia, unspecified 6/09  . Hearing loss in left ear   . Diverticulosis of colon 12/09  . Internal hemorrhoids   . GERD (gastroesophageal reflux disease)   . Allergic rhinitis, cause unspecified     on allergy shots (Dr. Barnetta Chapel)  . Allergic conjunctivitis    .,psh  History   Social History  . Marital Status: Married    Spouse Name: N/A    Number of Children: 2  . Years of Education: N/A   Occupational History  . Not on file.   Social History Main Topics  . Smoking status: Former Smoker    Types: Cigarettes    Quit date: 09/01/1977  . Smokeless tobacco: Never Used  . Alcohol Use: Yes     Comment: 1-2 glasses wine per day  . Drug Use: No  . Sexual Activity: Not on file   Other Topics Concern  . Not on file   Social History Narrative   Lives with wife, pet rats (new)   Current outpatient prescriptions:aspirin 81 MG tablet, Take 81 mg by mouth daily.  , Disp: , Rfl: ;  Ferrous  Sulfate (IRON) 325 (65 FE) MG TABS, Take 1 tablet by mouth every other day., Disp: , Rfl: ;  lisinopril (PRINIVIL,ZESTRIL) 30 MG tablet, Take 1 tablet (30 mg total) by mouth daily., Disp: 30 tablet, Rfl: 0;  omeprazole (PRILOSEC) 20 MG capsule, Take 1 capsule (20 mg total) by mouth every other day., Disp: 90 capsule, Rfl: 1 psyllium (REGULOID) 0.52 G capsule, Take 0.52 g by mouth daily., Disp: , Rfl: ;  SYNTHROID 50 MCG tablet, Take 50 mcg by mouth daily before breakfast. , Disp: , Rfl:   No Known Allergies  ROS:  Denies fevers, URI symptoms, chest pain, edema, GI complaints.  Urinary symptoms have improved/resolved.  Constipation has improved/resolved  PHYSICAL EXAM: BP 130/72  Pulse 72  Ht 5' 3.5" (1.613 m)  Wt 138 lb (62.596 kg)  BMI 24.06 kg/m2 Well developed, pleasant male, in no distress Neck: no lymphadenopathy Heart: regular rate and rhythm without murmur Lungs: clear bilaterally Extremities: no edema  ASSESSMENT/PLAN:  HYPERTENSION - Plan: lisinopril (PRINIVIL,ZESTRIL) 30 MG tablet, Basic metabolic panel  Encounter for long-term (current) use of other medications - Plan: Basic metabolic panel  Well controlled, with improved urinary complaints since stopping the HCTZ  F/u as previously scheduled

## 2013-07-15 LAB — BASIC METABOLIC PANEL
Potassium: 4.2 mEq/L (ref 3.5–5.3)
Sodium: 138 mEq/L (ref 135–145)

## 2013-07-20 DIAGNOSIS — M545 Low back pain, unspecified: Secondary | ICD-10-CM | POA: Diagnosis not present

## 2013-07-20 DIAGNOSIS — J309 Allergic rhinitis, unspecified: Secondary | ICD-10-CM | POA: Diagnosis not present

## 2013-07-26 DIAGNOSIS — J309 Allergic rhinitis, unspecified: Secondary | ICD-10-CM | POA: Diagnosis not present

## 2013-08-03 DIAGNOSIS — J309 Allergic rhinitis, unspecified: Secondary | ICD-10-CM | POA: Diagnosis not present

## 2013-08-10 ENCOUNTER — Encounter: Payer: Self-pay | Admitting: Family Medicine

## 2013-08-10 DIAGNOSIS — J309 Allergic rhinitis, unspecified: Secondary | ICD-10-CM | POA: Diagnosis not present

## 2013-08-15 ENCOUNTER — Other Ambulatory Visit: Payer: Self-pay | Admitting: *Deleted

## 2013-08-15 MED ORDER — TRIAMCINOLONE ACETONIDE 0.1 % EX CREA: 1.0000 "application " | TOPICAL_CREAM | Freq: Two times a day (BID) | CUTANEOUS | Status: DC

## 2013-08-16 DIAGNOSIS — J309 Allergic rhinitis, unspecified: Secondary | ICD-10-CM | POA: Diagnosis not present

## 2013-08-23 DIAGNOSIS — J309 Allergic rhinitis, unspecified: Secondary | ICD-10-CM | POA: Diagnosis not present

## 2013-08-31 DIAGNOSIS — J309 Allergic rhinitis, unspecified: Secondary | ICD-10-CM | POA: Diagnosis not present

## 2013-09-07 DIAGNOSIS — J309 Allergic rhinitis, unspecified: Secondary | ICD-10-CM | POA: Diagnosis not present

## 2013-09-12 DIAGNOSIS — M48061 Spinal stenosis, lumbar region without neurogenic claudication: Secondary | ICD-10-CM | POA: Diagnosis not present

## 2013-09-12 DIAGNOSIS — M7130 Other bursal cyst, unspecified site: Secondary | ICD-10-CM | POA: Diagnosis not present

## 2013-09-12 DIAGNOSIS — E669 Obesity, unspecified: Secondary | ICD-10-CM | POA: Diagnosis not present

## 2013-09-12 DIAGNOSIS — I10 Essential (primary) hypertension: Secondary | ICD-10-CM | POA: Diagnosis not present

## 2013-09-14 DIAGNOSIS — J309 Allergic rhinitis, unspecified: Secondary | ICD-10-CM | POA: Diagnosis not present

## 2013-09-16 ENCOUNTER — Encounter: Payer: Self-pay | Admitting: Family Medicine

## 2013-09-16 ENCOUNTER — Ambulatory Visit (INDEPENDENT_AMBULATORY_CARE_PROVIDER_SITE_OTHER): Payer: Medicare Other | Admitting: Family Medicine

## 2013-09-16 VITALS — BP 170/96 | HR 75 | Wt 142.0 lb

## 2013-09-16 DIAGNOSIS — J309 Allergic rhinitis, unspecified: Secondary | ICD-10-CM | POA: Diagnosis not present

## 2013-09-16 DIAGNOSIS — I10 Essential (primary) hypertension: Secondary | ICD-10-CM

## 2013-09-16 DIAGNOSIS — J019 Acute sinusitis, unspecified: Secondary | ICD-10-CM

## 2013-09-16 MED ORDER — AMOXICILLIN 875 MG PO TABS: 875.0000 mg | ORAL_TABLET | Freq: Two times a day (BID) | ORAL | Status: DC

## 2013-09-16 NOTE — Patient Instructions (Signed)
Take all the medicine and call if not back to normal

## 2013-09-16 NOTE — Progress Notes (Signed)
   Subjective:    Patient ID: Danny Frederick, male    DOB: [birth year], 80 y.o.   MRN: [id number]  HPI He has a three-day history of nasal congestion with bloody drainage, PND, rhinorrhea , sore throat, sinus congestion and headache. No fever or chills. He continues on his blood pressure medication. He does not smoke. He is getting immunizations for his underlying allergies. He is now 2 years into this.   Review of Systems     Objective:   Physical Exam alert and in no distress. Tympanic membranes and canals are normal. Throat is clear. Tonsils are normal. Neck is supple without adenopathy or thyromegaly. Cardiac exam shows a regular sinus rhythm without murmurs or gallops. Lungs are clear to auscultation. As the mucosa is red with some tenderness over maxillary sinuses       Assessment & Plan:  Unspecified essential hypertension  Acute sinusitis - Plan: amoxicillin (AMOXIL) 875 MG tablet  Allergic rhinitis  He is to call if not entirely better. Discussed his allergies and immunizations and recommended discussing further shots with his allergist. He will return here for followup on his blood pressure

## 2013-09-19 ENCOUNTER — Encounter: Payer: Self-pay | Admitting: Family Medicine

## 2013-09-19 MED ORDER — AMOXICILLIN-POT CLAVULANATE 875-125 MG PO TABS: 1.0000 | ORAL_TABLET | Freq: Two times a day (BID) | ORAL | Status: DC

## 2013-09-19 NOTE — Telephone Encounter (Signed)
Her symptoms have worsened. I will switch her to Augmentin.

## 2013-09-21 DIAGNOSIS — J309 Allergic rhinitis, unspecified: Secondary | ICD-10-CM | POA: Diagnosis not present

## 2013-09-23 DIAGNOSIS — J309 Allergic rhinitis, unspecified: Secondary | ICD-10-CM | POA: Diagnosis not present

## 2013-09-27 DIAGNOSIS — J309 Allergic rhinitis, unspecified: Secondary | ICD-10-CM | POA: Diagnosis not present

## 2013-10-05 DIAGNOSIS — J309 Allergic rhinitis, unspecified: Secondary | ICD-10-CM | POA: Diagnosis not present

## 2013-10-12 DIAGNOSIS — J309 Allergic rhinitis, unspecified: Secondary | ICD-10-CM | POA: Diagnosis not present

## 2013-10-19 DIAGNOSIS — J309 Allergic rhinitis, unspecified: Secondary | ICD-10-CM | POA: Diagnosis not present

## 2013-10-26 DIAGNOSIS — J309 Allergic rhinitis, unspecified: Secondary | ICD-10-CM | POA: Diagnosis not present

## 2013-11-02 DIAGNOSIS — J309 Allergic rhinitis, unspecified: Secondary | ICD-10-CM | POA: Diagnosis not present

## 2013-11-14 DIAGNOSIS — J309 Allergic rhinitis, unspecified: Secondary | ICD-10-CM | POA: Diagnosis not present

## 2013-11-23 DIAGNOSIS — J309 Allergic rhinitis, unspecified: Secondary | ICD-10-CM | POA: Diagnosis not present

## 2013-11-30 DIAGNOSIS — J309 Allergic rhinitis, unspecified: Secondary | ICD-10-CM | POA: Diagnosis not present

## 2013-12-02 ENCOUNTER — Emergency Department: Payer: Medicare Other

## 2013-12-02 ENCOUNTER — Observation Stay: Payer: Medicare Other

## 2013-12-02 ENCOUNTER — Emergency Department
Admission: EM | Admit: 2013-12-02 | Discharge: 2013-12-02 | Disposition: A | Discharge status: Home / Self Care | Payer: Medicare Other | Attending: Emergency Medicine | Admitting: Emergency Medicine

## 2013-12-02 DIAGNOSIS — I517 Cardiomegaly: Secondary | ICD-10-CM | POA: Diagnosis not present

## 2013-12-02 DIAGNOSIS — R059 Cough, unspecified: Secondary | ICD-10-CM | POA: Diagnosis not present

## 2013-12-02 DIAGNOSIS — I10 Essential (primary) hypertension: Secondary | ICD-10-CM | POA: Diagnosis not present

## 2013-12-02 DIAGNOSIS — E039 Hypothyroidism, unspecified: Secondary | ICD-10-CM | POA: Diagnosis not present

## 2013-12-02 DIAGNOSIS — Z87891 Personal history of nicotine dependence: Secondary | ICD-10-CM | POA: Diagnosis not present

## 2013-12-02 DIAGNOSIS — J479 Bronchiectasis, uncomplicated: Secondary | ICD-10-CM | POA: Diagnosis not present

## 2013-12-02 DIAGNOSIS — K219 Gastro-esophageal reflux disease without esophagitis: Secondary | ICD-10-CM | POA: Diagnosis not present

## 2013-12-02 DIAGNOSIS — I4949 Other premature depolarization: Secondary | ICD-10-CM | POA: Diagnosis not present

## 2013-12-02 DIAGNOSIS — R911 Solitary pulmonary nodule: Secondary | ICD-10-CM | POA: Diagnosis not present

## 2013-12-02 DIAGNOSIS — I2584 Coronary atherosclerosis due to calcified coronary lesion: Secondary | ICD-10-CM | POA: Diagnosis not present

## 2013-12-02 DIAGNOSIS — R079 Chest pain, unspecified: Secondary | ICD-10-CM | POA: Diagnosis not present

## 2013-12-02 DIAGNOSIS — K449 Diaphragmatic hernia without obstruction or gangrene: Secondary | ICD-10-CM | POA: Diagnosis not present

## 2013-12-02 DIAGNOSIS — R05 Cough: Secondary | ICD-10-CM | POA: Diagnosis not present

## 2013-12-02 DIAGNOSIS — J029 Acute pharyngitis, unspecified: Secondary | ICD-10-CM | POA: Diagnosis not present

## 2013-12-02 HISTORY — DX: Disorder of thyroid, unspecified: E07.9

## 2013-12-02 HISTORY — DX: Gastro-esophageal reflux disease without esophagitis: K21.9

## 2013-12-02 HISTORY — DX: Essential (primary) hypertension: I10

## 2013-12-02 LAB — CBC AND DIFFERENTIAL
Basophils Absolute Automated: 0.02 10*3/uL (ref 0.00–0.20)
Basophils Automated: 0 %
Eosinophils Absolute Automated: 0.15 10*3/uL (ref 0.00–0.70)
Eosinophils Automated: 2 %
Hematocrit: 40 % — ABNORMAL LOW (ref 42.0–52.0)
Hgb: 13.5 g/dL (ref 13.0–17.0)
Immature Granulocytes Absolute: 0.02 10*3/uL
Immature Granulocytes: 0 %
Lymphocytes Absolute Automated: 2.06 10*3/uL (ref 0.50–4.40)
Lymphocytes Automated: 26 %
MCH: 28.1 pg (ref 28.0–32.0)
MCHC: 33.8 g/dL (ref 32.0–36.0)
MCV: 83.2 fL (ref 80.0–100.0)
MPV: 8.6 fL — ABNORMAL LOW (ref 9.4–12.3)
Monocytes Absolute Automated: 0.94 10*3/uL (ref 0.00–1.20)
Monocytes: 12 %
Neutrophils Absolute: 4.7 10*3/uL (ref 1.80–8.10)
Neutrophils: 60 %
Nucleated RBC: 0 /100 WBC (ref 0–1)
Platelets: 495 10*3/uL — ABNORMAL HIGH (ref 140–400)
RBC: 4.81 10*6/uL (ref 4.70–6.00)
RDW: 14 % (ref 12–15)
WBC: 7.87 10*3/uL (ref 3.50–10.80)

## 2013-12-02 LAB — COMPREHENSIVE METABOLIC PANEL
ALT: 17 U/L (ref 0–55)
AST (SGOT): 20 U/L (ref 5–34)
Albumin/Globulin Ratio: 0.7 — ABNORMAL LOW (ref 0.9–2.2)
Albumin: 3.3 g/dL — ABNORMAL LOW (ref 3.5–5.0)
Alkaline Phosphatase: 86 U/L (ref 40–150)
BUN: 16 mg/dL (ref 7–21)
Bilirubin, Total: 0.4 mg/dL (ref 0.2–1.2)
CO2: 25 mEq/L (ref 22–29)
Calcium: 9.6 mg/dL (ref 7.9–10.6)
Chloride: 104 mEq/L (ref 98–107)
Creatinine: 1 mg/dL (ref 0.7–1.3)
Globulin: 5 g/dL — ABNORMAL HIGH (ref 2.0–3.6)
Glucose: 98 mg/dL (ref 70–100)
Potassium: 4.3 mEq/L (ref 3.5–5.1)
Protein, Total: 8.3 g/dL (ref 6.0–8.3)
Sodium: 138 mEq/L (ref 136–145)

## 2013-12-02 LAB — TSH: TSH: 2.4 u[IU]/mL (ref 0.35–4.94)

## 2013-12-02 LAB — URINALYSIS, REFLEX TO MICROSCOPIC EXAM IF INDICATED
Bilirubin, UA: NEGATIVE
Blood, UA: NEGATIVE
Glucose, UA: NEGATIVE
Ketones UA: NEGATIVE
Leukocyte Esterase, UA: NEGATIVE
Nitrite, UA: NEGATIVE
Protein, UR: NEGATIVE
Specific Gravity UA: 1.005 (ref 1.001–1.035)
Urine pH: 7 (ref 5.0–8.0)
Urobilinogen, UA: NEGATIVE mg/dL

## 2013-12-02 LAB — IHS D-DIMER: D-Dimer: 1.02 ug/mL FEU — ABNORMAL HIGH (ref 0.00–0.51)

## 2013-12-02 LAB — GFR: EGFR: >60

## 2013-12-02 LAB — GROUP A STREP, RAPID ANTIGEN: Group A Strep, Rapid Antigen: NEGATIVE

## 2013-12-02 LAB — B-TYPE NATRIURETIC PEPTIDE: B-Natriuretic Peptide: 70 pg/mL (ref 0–100)

## 2013-12-02 LAB — TROPONIN I: Troponin I: <0.01 ng/mL (ref 0.00–0.09)

## 2013-12-02 MED ORDER — ASPIRIN 325 MG PO TABS
325.0000 mg | ORAL_TABLET | Freq: Once | ORAL | Status: AC
Start: 2013-12-02 — End: 2013-12-02
  Administered 2013-12-02: 325 mg via ORAL
  Filled 2013-12-02: qty 1

## 2013-12-02 MED ORDER — ASPIRIN 325 MG PO TABS
650.0000 mg | ORAL_TABLET | Freq: Four times a day (QID) | ORAL | Status: AC | PRN
Start: 2013-12-02 — End: ?

## 2013-12-02 MED ORDER — AMLODIPINE BESYLATE 5 MG PO TABS
5.0000 mg | ORAL_TABLET | Freq: Every day | ORAL | Status: DC
Start: 2013-12-02 — End: 2013-12-02
  Administered 2013-12-02: 5 mg via ORAL
  Filled 2013-12-02: qty 1

## 2013-12-02 MED ORDER — LISINOPRIL 10 MG PO TABS
20.0000 mg | ORAL_TABLET | Freq: Once | ORAL | Status: AC
Start: 2013-12-02 — End: 2013-12-02
  Administered 2013-12-02: 20 mg via ORAL
  Filled 2013-12-02: qty 2

## 2013-12-02 MED ORDER — MORPHINE SULFATE 2 MG/ML IJ/IV SOLN (WRAP)
2.0000 mg | Freq: Once | Status: DC
Start: 2013-12-02 — End: 2013-12-02
  Filled 2013-12-02: qty 1

## 2013-12-02 MED ORDER — IOHEXOL 350 MG/ML IV SOLN
INTRAVENOUS | Status: AC
Start: 2013-12-02 — End: 2013-12-02
  Administered 2013-12-02: 100 mL via INTRAVENOUS
  Filled 2013-12-02: qty 100

## 2013-12-02 MED ORDER — FLUTICASONE PROPIONATE 50 MCG/ACT NA SUSP
2.0000 | Freq: Every day | NASAL | Status: AC
Start: 2013-12-02 — End: 2014-01-01

## 2013-12-02 MED ORDER — LISINOPRIL 10 MG PO TABS
20.0000 mg | ORAL_TABLET | Freq: Once | ORAL | Status: DC
Start: 2013-12-02 — End: 2013-12-02
  Filled 2013-12-02: qty 2

## 2013-12-02 MED ORDER — AMLODIPINE BESYLATE 5 MG PO TABS
5.0000 mg | ORAL_TABLET | Freq: Once | ORAL | Status: AC
Start: 2013-12-02 — End: 2013-12-02
  Administered 2013-12-02: 5 mg via ORAL
  Filled 2013-12-02: qty 1

## 2013-12-02 MED ORDER — LISINOPRIL 10 MG PO TABS
40.0000 mg | ORAL_TABLET | Freq: Every day | ORAL | Status: DC
Start: 2013-12-03 — End: 2013-12-02

## 2013-12-02 MED ORDER — ACETAMINOPHEN 325 MG PO TABS
975.0000 mg | ORAL_TABLET | Freq: Once | ORAL | Status: AC
Start: 2013-12-02 — End: 2013-12-02
  Administered 2013-12-02: 975 mg via ORAL
  Filled 2013-12-02: qty 3

## 2013-12-02 MED ORDER — METOCLOPRAMIDE HCL 5 MG/ML IJ SOLN
5.0000 mg | Freq: Once | INTRAMUSCULAR | Status: DC
Start: 2013-12-02 — End: 2013-12-02
  Filled 2013-12-02: qty 2

## 2013-12-02 MED ORDER — OXYCODONE-ACETAMINOPHEN 5-325 MG PO TABS
1.0000 | ORAL_TABLET | ORAL | Status: DC | PRN
Start: 2013-12-02 — End: 2017-12-17

## 2013-12-02 MED ORDER — IOHEXOL 350 MG/ML IV SOLN
100.0000 mL | Freq: Once | INTRAVENOUS | Status: AC | PRN
Start: 2013-12-02 — End: 2013-12-02

## 2013-12-02 MED ORDER — AMLODIPINE BESYLATE 5 MG PO TABS
5.0000 mg | ORAL_TABLET | Freq: Every day | ORAL | Status: DC
Start: 2013-12-02 — End: 2013-12-02

## 2013-12-02 MED ORDER — PROCHLORPERAZINE MALEATE 5 MG PO TABS
10.0000 mg | ORAL_TABLET | Freq: Four times a day (QID) | ORAL | Status: AC | PRN
Start: 2013-12-02 — End: 2013-12-09

## 2013-12-02 MED ORDER — AMLODIPINE BESYLATE 2.5 MG PO TABS
5.0000 mg | ORAL_TABLET | Freq: Every day | ORAL | Status: AC
Start: 2013-12-02 — End: ?

## 2013-12-02 NOTE — Consults (Addendum)
Bari Edward, MD  [facility] cardiology  Tel:  [phone]  Physician Line: [phone]      Reason for Consult: Asked to see patient because of chest pain  Referring Physician: Dr Crissie Figures      Assessment and Plan:     Chest tightness: positional. Initial CE and EKG unremarkable.  EKG not diagnostic  Of pericarditis, no pericardial fluid seen on CTA.  Associated sorethroat and neck discomfort with no obvious pathology on exam , possible anginal equivalent given coronary artery calcification on CTA. Given his Severe HTN, cannot schedule ETT now, Will schedule once BP better   HTN: severe . No response to Lisinopril 40 mg( was given  20 in the ER and 20 at home) . Will add Norvasc.    D/w management with the pt and the referring physician - Dr  Crissie Figures    Cardiology Diagnostics     ECG:  I have personally ordered reviewed the tracing from 4/3.  In summary NSR@85 /min    Telemetry: NSR (I have personally reviewed the telemetry strips)    Chest -Xray:  I have personally reviewed it  from 4/2. In summary  Probable nipple shadow at the left lung base. Suggest repeat   chest x-ray with nipple markers. Mild cardiomegaly..     CTA:  1. No evidence of pulmonary embolism.   2. Left upper lobe lung nodule. 3 months follow-up recommended.   3. Bibasilar bronchiectasis.   4. Cardiomegaly.   5. Coronary artery calcifications.   6. Hiatal hernia.       History of the Present Illness:    80 y/o with hypothyroidism, HTN, GERD presents with:  Sore throat - 2 weeks, Dry cough initially , none at this point     Chest tightness , SS , mild , worse when laying down, better with sitting up since 2 weeks, not pleuritic, exertional.no radiation or associated symptoms.  Has had intermittent sore throat and cough since Jan 2015 which has prevented him being as physically active as before. At baseline he was able to work ou 2 hours 3-4 days a week with no exertional symptoms    In the ER, had CTA which showed CAC  .     Currently  with b/l neck discomfort and  throat discomfort    Past  Medical History:     Past Medical History   Diagnosis Date   . Hypertension    . Disorder of thyroid    . GERD (gastroesophageal reflux disease)      Past Surgical History   Procedure Date   . Back surgery    . Rotator cuff repair        Social History:     History     Social History   . Marital Status: Married     Spouse Name: N/A     Number of Children: N/A   . Years of Education: N/A     Social History Main Topics   . Smoking status: Former Games developer   . Smokeless tobacco: None   . Alcohol Use: Yes   . Drug Use:    . Sexually Active: None     Other Topics Concern   . None     Social History Narrative   . None       Family History:     Mother - CHF  Home medications:   Reviewed personally     Prior to Admission medications    Medication Sig Start  Date End Date Taking? Authorizing Provider   IRON PO Take by mouth.   Yes [provider]   levothyroxine (SYNTHROID, LEVOTHROID) 50 MCG tablet Take 50 mcg by mouth Once a day at 6:00am.   Yes [provider]   lisinopril-hydrochlorothiazide (PRINZIDE,ZESTORETIC) 10-12.5 MG per tablet Take 1 tablet by mouth daily.   Yes [provider]   omeprazole (PRILOSEC) 20 MG capsule Take 20 mg by mouth daily.   Yes [provider]       Current Medications:   Reviewed personally         acetaminophen 975 mg Once in ED   [COMPLETED] aspirin 325 mg Once   aspirin 325 mg Once   lisinopril 20 mg Once   metoclopramide 5 mg Once   morphine 2 mg Once         Allergies:     No Known Allergies     Review of Symptoms:     Headache+   All other ROS were reviewed and were  negative except as noted in HPI    Physical Examination:    Vitals reviewed:   BP 227/98  Pulse 80  Temp 98.3 F (36.8 C)  Resp 10  SpO2 98%  No intake or output data in the 24 hours ending 12/02/13 1035  Constitutional :  no acute distress, appears well nourished.    Eyes:  No Pallor or Icterus.  ENMT  mucous membranes moist. No  erythema of the tonsils/ minimally enlarged nodes  Neck: No JVD.   normal thyroid gland  Respiratory:  Good air movement and respiratory effort  bilaterally. No use of accessory muscles. Clear bilaterally. No rales orwheezes.  Cardiovascular:   PMI  Not displaced. Regular . Nl S1 and S2.Marland Kitchen  NO MRG. No carotid bruits.   No abdominal bruits heard . Dorsal pedis 2+b/l.   No edema.       Extremities: No Clubbing and cyanosis  Gastrointestinal Soft. Non-tender. Normoactive BS. No abdominal bruits  Skin: Warm. Dry , No Ulcers  Neurologic: Grossly intact.    Musculoskeletal: No  Kyphosis or Scoliosis.   Psychiatric: AAO X3.  Normal mood and effect.      Laboratory Studies:  (I have personally reviewed the labs below)      CBC w/Diff     Lab 12/02/13 0730   WBC 7.87   HGB 13.5   HCT 40.0*   PLT 495*   NEUTROPCT --   MONOPCT --          Basic Metabolic Profile     Lab 12/02/13 0730   NA 138   K 4.3   CL 104   CO2 25   BUN 16   CREAT 1.0   EGFR >60.0   GLU 98   CA 9.6            Cardiac Enzymes     Lab 12/02/13 0730   CK --   TROPI <0.01   TROPT --   CKMBINDEX --          Thyroid Studies      Lab 12/02/13 0730   TSH 2.40   FREET3 --           Lipid Profile   No results found for this basename: CHOL in the last 168 hours       Coagulation Studies   No results found for this basename: PT:3,INR:3,PTT:3 in the last 168 hours  Thank you for allowing [place] to participate in the care of this patient.  Will follow with you.      Bari Edward  12/02/2013, 10:35 AM

## 2013-12-02 NOTE — ED Notes (Signed)
Pt c/o sore throat and chest tightness, onset a week ago. Symptoms worsen when supine. Denies fevers. Nonproductive cough. No OTC meds for symptoms. No wheezing to auscultation. Pt in NAD>

## 2013-12-02 NOTE — ED Provider Notes (Signed)
Physician/Midlevel provider first contact with patient: 12/02/13 0644         History     Chief Complaint   Patient presents with   . Cough     Patient is a 80 y.o. male presenting with cough. The history is provided by the patient and medical records. No language interpreter was used.   Cough  This is a recurrent problem. The current episode started more than 1 week ago. The problem occurs every few minutes. The cough is non-productive. There has been no fever. Associated symptoms include chest pain, rhinorrhea and sore throat. Pertinent negatives include no shortness of breath. He has tried nothing for the symptoms. He is not a smoker. His past medical history does not include bronchitis, pneumonia or COPD.   former smoker.  Has had chest pressure laying flat at night recently.  Cough, runny nose, and sore throat for two weeks.  No radiation of pain.     Past Medical History   Diagnosis Date   . Hypertension    . Disorder of thyroid    . GERD (gastroesophageal reflux disease)        Past Surgical History   Procedure Date   . Back surgery    . Rotator cuff repair        No family history on file.    Social  History   Substance Use Topics   . Smoking status: Former Games developer   . Smokeless tobacco: Not on file   . Alcohol Use: Yes       .     No Known Allergies    Current/Home Medications    IRON PO    Take by mouth.    LEVOTHYROXINE (SYNTHROID, LEVOTHROID) 50 MCG TABLET    Take 50 mcg by mouth Once a day at 6:00am.    LISINOPRIL-HYDROCHLOROTHIAZIDE (PRINZIDE,ZESTORETIC) 10-12.5 MG PER TABLET    Take 1 tablet by mouth daily.    OMEPRAZOLE (PRILOSEC) 20 MG CAPSULE    Take 20 mg by mouth daily.        Review of Systems   Constitutional: Negative for fever.   HENT: Positive for rhinorrhea and sore throat.    Respiratory: Positive for cough. Negative for shortness of breath.    Cardiovascular: Positive for chest pain. Negative for leg swelling.   All other systems reviewed and are negative.        Physical Exam    BP:  207/97 mmHg, Heart Rate: 90 , Temp: 98.3 F (36.8 C), Resp Rate: 19 , SpO2: 99 %    Physical Exam  Nursing note and vitals reviewed.  Constitutional:  Well developed, well nourished. Awake & Oriented x3.  Head:  Atraumatic. Normocephalic.    Eyes:  PERRL. EOMI. Conjunctivae are not pale.  ENT:  Mucous membranes are moist and intact. Oropharynx is clear and symmetric.  Patent airway.  Neck:  Supple. Full ROM.    Cardiovascular:  Regular rate. Regular rhythm. No murmurs, rubs, or gallops.  Pulmonary/Chest:  No evidence of respiratory distress. Clear to auscultation bilaterally.  No wheezing, rales or rhonchi.   Abdominal:  Soft and non-distended. There is no tenderness. No rebound, guarding, or rigidity.  Back:  Full ROM. Nontender.  Extremities:  No edema. No cyanosis. No clubbing. Full range of motion in all extremities.  Skin:  Skin is warm and dry.  No diaphoresis. No rash.   Neurological:  Alert, awake, and appropriate. Normal speech. Motor normal.  Psychiatric:  Good eye contact.  Normal interaction, affect, and behavior.    Pox 99 ra normal no tx needed.    MDM and ED Course     ED Medication Orders      Start     Status Ordering Provider    12/02/13 1243   amLODIPine (NORVASC) tablet 5 mg   Once in ED      Route: Oral  Ordered Dose: 5 mg         Last MAR action:  Given Mattelyn Imhoff C    12/02/13 1036   acetaminophen (TYLENOL) tablet 975 mg   Once in ED      Route: Oral  Ordered Dose: 975 mg         Last MAR action:  Given Corderius Saraceni C    12/02/13 1036   metoclopramide (REGLAN) injection 5 mg   Once      Route: Intravenous  Ordered Dose: 5 mg         Last MAR action:  Not Given Kinya Meine C    12/02/13 1036   morphine injection 2 mg   Once      Route: Intravenous  Ordered Dose: 2 mg         Last MAR action:  Not Given Briley Sulton C    12/02/13 1035   lisinopril (PRINIVIL,ZESTRIL) tablet 20 mg   Once      Route: Oral  Ordered Dose: 20 mg         Last MAR action:  Given Shantel Helwig C     12/02/13 1034   aspirin tablet 325 mg   Once      Route: Oral  Ordered Dose: 325 mg         Last MAR action:  Given Matai Carpenito C    12/02/13 0710   aspirin tablet 325 mg   Once      Route: Oral  Ordered Dose: 325 mg         Last MAR action:  Given Jaki Steptoe C               Results for orders placed during the hospital encounter of 12/02/13   XR CHEST AP PORTABLE    Narrative:     CLINICAL INDICATION: Pain    COMPARISON: None available    INTERPRETATION: Single portable frontal view of the chest obtained. Mild  cardiomegaly.  Nodular density at the left lung base projecting adjacent  to the left heart border, probably a nipple shadow. Lungs otherwise  clear. No effusions.      Impression:      Probable nipple shadow at the left lung base. Suggest repeat  chest x-ray with nipple markers. Mild cardiomegaly.Filbert Schilder, MD   12/02/2013 8:00 AM     CT ANGIOGRAM CHEST    Narrative:     HISTORY: Chest pain.    TECHNIQUE: Enhanced, computed tomography of the chest was performed at  3mm slice thickness. 3 D post processing was performed.     PRIOR: No prior studies.    FINDINGS:     There are no finding to suggest pulmonary embolism.. There is a 6 mm  nodule involving the left upper lobe best seen on image 56. 3 months  follow-up recommended. There is bibasilar bronchiectasis with  interstitial thickening.  There are no pleural effusions. Evaluation of  mediastinum shows no evidence of lymph nodes or enlarged masses. The  great vessels are normal  in caliber. The heart is mildly enlarged in  size and shape without pericardial effusions. There are coronary artery  calcifications. There is small size hiatal hernia. The bony structures  are unremarkable.       Impression:        1. No evidence of pulmonary embolism.  2. Left upper lobe lung nodule. 3 months follow-up recommended.  3. Bibasilar bronchiectasis.  4. Cardiomegaly.  5. Coronary artery calcifications.  6. Hiatal hernia.      Georgana Curio, MD    12/02/2013 10:15 AM     NM EXERCISE STRESS TEST    Narrative:     HISTORY / INDICATION:  Chest pain    EXAM:    BP 163/96 mmHg, HR 73 bpm  Lungs: clear, heart: regular    RESTING ECG: NSR@73 /min    PROTOCOL: Bruce  Total exercise time: 9 min  53 seconds  Exercise capacity: 12.8 METs  Peak BP: 203/76 mmHg  Peak HR: 174 bpm  % APMHR: 123 %  Target HR: 121 bpm.  Age predicted maximum HR (APMHR): 142 bpm    Reason for termination: Fatigue  Symptoms: As above, preexistent mild left-sided chest discomfort no  worse with exercise  ST segments: No ischemic changes noted  Arrhythmia: Rare PVCs  Recovery: Uneventful      Impression:        1. Normal Exercise Treadmill Test  2. Normal blood pressure response to exercise  3.  Rare PVCs with exercise  4.  Good exercise tolerance      I was present through the entire test and personally supervised it.    Bari Edward, MD   12/02/2013 2:13 PM       Results for orders placed during the hospital encounter of 12/02/13   CBC AND DIFFERENTIAL       Component Value Range    WBC 7.87  3.50 - 10.80 x10 3/uL    RBC 4.81  4.70 - 6.00 x10 6/uL    Hgb 13.5  13.0 - 17.0 g/dL    Hematocrit 16.1 (*) 42.0 - 52.0 %    MCV 83.2  80.0 - 100.0 fL    MCH 28.1  28.0 - 32.0 pg    MCHC 33.8  32.0 - 36.0 g/dL    RDW 14  12 - 15 %    Platelets 495 (*) 140 - 400 x10 3/uL    MPV 8.6 (*) 9.4 - 12.3 fL    Neutrophils 60  None %    Lymphocytes Automated 26  None %    Monocytes 12  None %    Eosinophils Automated 2  None %    Basophils Automated 0  None %    Immature Granulocyte 0  None %    Nucleated RBC 0  0 - 1 /100 WBC    Neutrophils Absolute 4.70  1.80 - 8.10 x10 3/uL    Abs Lymph Automated 2.06  0.50 - 4.40 x10 3/uL    Abs Mono Automated 0.94  0.00 - 1.20 x10 3/uL    Abs Eos Automated 0.15  0.00 - 0.70 x10 3/uL    Absolute Baso Automated 0.02  0.00 - 0.20 x10 3/uL    Absolute Immature Granulocyte 0.02  0 x10 3/uL   COMPREHENSIVE METABOLIC PANEL       Component Value Range    Glucose 98  70 - 100 mg/dL     BUN 16  7 - 21 mg/dL  Creatinine 1.0  0.7 - 1.3 mg/dL    Sodium 161  096 - 045 mEq/L    Potassium 4.3  3.5 - 5.1 mEq/L    Chloride 104  98 - 107 mEq/L    CO2 25  22 - 29 mEq/L    CALCIUM 9.6  7.9 - 10.6 mg/dL    Protein, Total 8.3  6.0 - 8.3 g/dL    Albumin 3.3 (*) 3.5 - 5.0 g/dL    AST (SGOT) 20  5 - 34 U/L    ALT 17  0 - 55 U/L    Alkaline Phosphatase 86  40 - 150 U/L    Bilirubin, Total 0.4  0.2 - 1.2 mg/dL    Globulin 5.0 (*) 2.0 - 3.6 g/dL    Albumin/Globulin Ratio 0.7 (*) 0.9 - 2.2   B-TYPE NATRIURETIC PEPTIDE       Component Value Range    B-Natriuretic Peptide 70  0 - 100 pg/mL   TROPONIN I       Component Value Range    Troponin I <0.01  0.00 - 0.09 ng/mL   TSH       Component Value Range    Thyroid Stimulating Hormone 2.40  0.35 - 4.94 uIU/mL   URINALYSIS, REFLEX TO MICROSCOPIC EXAM IF INDICATED       Component Value Range    Urine Type Clean Catch      Color, UA Colorless  Clear - Yellow    Clarity, UA Clear  Clear - Hazy    Specific Gravity UA 1.005  1.001-1.035    Urine pH 7.0  5.0-8.0    Leukocyte Esterase, UA Negative  Negative    Nitrite, UA Negative  Negative    Protein, UR Negative  Negative    Glucose, UA Negative  Negative    Ketones UA Negative  Negative    Urobilinogen, UA Negative  0.2  -  2.0 mg/dL    Bilirubin, UA Negative  Negative    Blood, UA Negative  Negative   I[facility] D-DIMER       Component Value Range    D-Dimer 1.02 (*) 0.00 - 0.51 ug/mL FEU   GFR       Component Value Range    EGFR >60.0     RAPID INFLUENZA A/B ANTIGENS    Narrative:     ORDER#: [id number]                                    ORDERED BY: Evely Gainey  SOURCE: Nasal Aspirate                               COLLECTED:  12/02/13 07:35  ANTIBIOTICS AT COLL.:                                RECEIVED :  12/02/13 07:42  Influenza Rapid Antigen A&B                FINAL       12/02/13 08:02  12/02/13   Negative for Influenza A and B             Reference Range: Negative     GROUP A STREP, RAPID ANTIGEN       Component Value  Range  Group A Strep, Rapid Antigen Negative  Negative         D-dimer high so will do ct to ro pe or mass as ? Nodule on plain xray.  MDM  Ekg:  nsr 85.  Lad.  No significant stt changes.  Read by me.  Monitor nsr 85.  I did ct to ro pe or mass.  In light of chest pressure Dr. Betti Cruz seeing now in er (11a) to eval for possible acs.  Pt tx with bp meds in ed.  Stress test not concerning so will [place] home.  Pt instructed to fu pmd as soon as possible when he returns home or return at once if worse.  Will add amlodipine to lisinopril.  Pt very nice and reliable so will .    Procedures    Clinical Impression & Disposition     Clinical Impression  Final diagnoses:   Chest pain   Sore throat        ED Disposition     Discharge Deryck L Gossen discharge to home/self care.    Condition at disposition: Stable             New Prescriptions    AMLODIPINE (NORVASC) 2.5 MG TABLET    Take 2 tablets (5 mg total) by mouth daily.    ASPIRIN 325 MG TABLET    Take 2 tablets (650 mg total) by mouth every 6 (six) hours as needed for Pain.    FLUTICASONE (FLONASE) 50 MCG/ACT NASAL SPRAY    2 sprays by Nasal route daily.    OXYCODONE-ACETAMINOPHEN (PERCOCET) 5-325 MG PER TABLET    Take 1-2 tablets by mouth every 4 (four) hours as needed for Pain.    PROCHLORPERAZINE (COMPAZINE) 5 MG TABLET    Take 2 tablets (10 mg total) by mouth every 6 (six) hours as needed for Nausea (or headache).                 Kennith Maes, MD  12/02/13 [phone]

## 2013-12-02 NOTE — ED Notes (Signed)
Called to give report, waiting for nurse callback.

## 2013-12-02 NOTE — Discharge Instructions (Signed)
Pharyngitis    You have been diagnosed with pharyngitis.    Pharyngitis is an infection of the back of the throat. Most sore throats are caused by viruses and do not require antibiotics. Some sore throats are caused by bacteria. Antibiotics will help this type of sore throat. A test for Strep throat may be used to help in your diagnosis.    Symptoms of pharyngitis include fever, sore throat, and a hoarse voice. If you have cold symptoms such as sneezing and coughing, runny nose, or congestion, your sore throat is more likely to be caused by a virus and not bacteria.    Whether your sore throat is caused by a virus or bacteria, you may need medication for pain and fever. You should also drink a lot of fluid. If your sore throat is caused by bacteria, you will also need antibiotics. If your sore throat is caused by a virus, you do not need antibiotics. Antibiotics will not kill the virus and they may cause side-effects, like diarrhea, abdominal cramps, or allergic reactions. Taking an antibiotic that you do not need may cause "resistance," meaning that antibiotic won't work in the future when you have a true bacterial infection.    YOU SHOULD SEEK MEDICAL ATTENTION IMMEDIATELY, EITHER HERE OR AT THE NEAREST EMERGENCY DEPARTMENT, IF ANY OF THE FOLLOWING OCCURS:   You have difficulty breathing.   Your voices changes or seems muffled.   You have trouble swallowing.   You have a fever that won't go away (temperature greater than 100.4 F).   You feel worse or do not improve after 2 to 3 days.      Chest Pain of Unclear Etiology    You have been seen for chest pain. The cause of your pain is not yet known.    Your doctor has learned about your medical history, examined you, and checked any tests that were done. Still, it is unclear why you are having pain. The doctor thinks there is only a very small chance that your pain is caused by a life-threatening condition. Later, your primary care doctor might do more  tests or check you again.    Sometimes chest pain is caused by a dangerous condition, like a heart attack, aorta injury, blood clot in the lung, or collapsed lung. It is unlikely that your pain is caused by a life-threatening condition if: Your chest pain lasts only a few seconds at a time; you are not short of breath, nauseated (sick to your stomach), sweaty, or lightheaded; your pain gets worse when you twist or bend; your pain improves with exercise or hard work.    Chest pain is serious. It is VERY IMPORTANT that you follow up with your regular doctor and seek medical attention immediately here or at the nearest Emergency Department if your symptoms become worse or they change.    YOU SHOULD SEEK MEDICAL ATTENTION IMMEDIATELY, EITHER HERE OR AT THE NEAREST EMERGENCY DEPARTMENT, IF ANY OF THE FOLLOWING OCCURS:   Your pain gets worse.   Your pain makes you short of breath, nauseated, or sweaty.   Your pain gets worse when you walk, go up stairs, or exert yourself.   You feel weak, lightheaded, or faint.   It hurts to breathe.   Your leg swells.   Your symptoms get worse or you have new symptoms or concerns.

## 2013-12-03 LAB — ECG 12-LEAD
Atrial Rate: 85 {beats}/min
P Axis: 29 degrees
P-R Interval: 150 ms
Q-T Interval: 386 ms
QRS Duration: 78 ms
QTC Calculation (Bezet): 459 ms
R Axis: -32 degrees
T Axis: 29 degrees
Ventricular Rate: 85 {beats}/min

## 2013-12-07 DIAGNOSIS — J309 Allergic rhinitis, unspecified: Secondary | ICD-10-CM | POA: Diagnosis not present

## 2013-12-08 ENCOUNTER — Ambulatory Visit (INDEPENDENT_AMBULATORY_CARE_PROVIDER_SITE_OTHER): Payer: Medicare Other | Admitting: Family Medicine

## 2013-12-08 ENCOUNTER — Encounter: Payer: Self-pay | Admitting: Family Medicine

## 2013-12-08 VITALS — BP 130/90 | HR 80 | Temp 98.5°F | Ht 63.0 in | Wt 136.0 lb

## 2013-12-08 DIAGNOSIS — J309 Allergic rhinitis, unspecified: Secondary | ICD-10-CM | POA: Diagnosis not present

## 2013-12-08 DIAGNOSIS — E039 Hypothyroidism, unspecified: Secondary | ICD-10-CM | POA: Diagnosis not present

## 2013-12-08 DIAGNOSIS — I10 Essential (primary) hypertension: Secondary | ICD-10-CM

## 2013-12-08 DIAGNOSIS — J029 Acute pharyngitis, unspecified: Secondary | ICD-10-CM | POA: Diagnosis not present

## 2013-12-08 DIAGNOSIS — R5383 Other fatigue: Principal | ICD-10-CM

## 2013-12-08 DIAGNOSIS — R5381 Other malaise: Secondary | ICD-10-CM | POA: Diagnosis not present

## 2013-12-08 DIAGNOSIS — F411 Generalized anxiety disorder: Secondary | ICD-10-CM

## 2013-12-08 DIAGNOSIS — R911 Solitary pulmonary nodule: Secondary | ICD-10-CM | POA: Insufficient documentation

## 2013-12-08 MED ORDER — AMLODIPINE BESYLATE 2.5 MG PO TABS: 2.5000 mg | ORAL_TABLET | Freq: Every day | ORAL | Status: DC

## 2013-12-08 NOTE — Progress Notes (Signed)
Chief Complaint  Patient presents with  . Headache    states he has no engery and very sleepy at times.  states he hasn't had  no shortness of breath or  chest pain. sore throat comes and goes.  states he has the problem for at least 2 weeks. (all meds reconciled)   Patient presents for hospital follow-up, with ongoing complaint of fatigue.  He had headaches and sore throat while in [place], and BP was very high. He went to ER, and he had a very thorough exam.  He reports that he described a chest discomfort like elephant standing on is chest, and therefore a full cardiac w/u was done, including stress test and CT angio.  Ultimately he was told that he likely had a viral illness, and he was given amlodipine to take in addition to his current BP meds, Flonase, and compazine prn for headaches/nausea.  Amlodipine 5mg  was added (two 2.5mg ).  He was only given a week supply.  Headaches resolved while taking amlodipine.  BP's improved, and he ran out of medication 1-2 days ago.  He didn't think he still needed it because BP's were better. BP's while taking amlodipine were running 135/?diastolic (can't recall, didn't bring list).  He felt like the compazine was very helpful in treating his sore throat; he finished the prescription and is asking if he should continue it (still has mild sore throat).  He was prescribed Flonase--admits to using it just a few times, and stopped it because it didn't seem to help. He was prescribed percocet also, but didn't fill the prescription.   He is having sniffling, sneezing, clear runny nose, itchy eyes, .  He continues to complain of left sided sore throat.  He denies postnasal drainage, however his wife and daugher report hearing constant throat-clearing. He continues to take the prilosec in the same fashion as he had been (every other day, alternating with iron), and denies any heartburn. He was noted to have a hiatal hernia on imaging done in ER.  He brings in  copies of records from the ER. These were reviewed in detail. Lab eval included Hg 13.5, normal WBC, normal chem, strep and influenza tests NM Exercise stress test was normal CT angio of chest showed 87mm LUL nodule; 3 month f/u recommended. Bibasilar bronchiectasis, cardiomegaly and hiatal hernia also noted  Pt reports this his BP's had been running 737-106 systolic prior to trip to [place], when feeling well, higher than they had been running at his last visit.  He brings in a typed piece of paper with all of his symptoms and concerns--see scanned sheet.  The conclusion that he came to is that he might have depression (runs in his family), and has anxiety.  He took a xanax earlier today when feeling very anxious, which helped some.  His family doesn't think he has been depressed prior to this recent illness, just feeling very tired and unable to do the things that he normally enjoys (like going to the gym) just in the last week. He denies suicidality. He always has some level of anxiety, per wife/daughter.  He thinks it kicks in worse when he isn't feeling well, as he is always used to being so healthy, it scares him when he doesn't feel well.  Once he starts feeling bad, he looks up things on google, which just exacerbates his anxiety, always thinking the worst. He is sleeping a lot, tired easily over the last week.  Denies any significant fatigue  prior to this illness  Past Medical History  Diagnosis Date  . Hypertension   . Unspecified hypothyroidism   . Mitral valve prolapse     h/o; normal echo 12/2011 with no MVP seen  . Foot drop, right   . BPH (benign prostatic hypertrophy)   . Iron deficiency anemia, unspecified 6/09  . Hearing loss in left ear   . Diverticulosis of colon 12/09  . Internal hemorrhoids   . GERD (gastroesophageal reflux disease)   . Allergic rhinitis, cause unspecified     on allergy shots (Dr. Barnetta Chapel)  . Allergic conjunctivitis    Past Surgical History  Procedure  Laterality Date  . Prostate surgery  2007    photovaporization  . Cervical laminectomy  1972    C5-6  . Spine surgery  2006    L4-5 disk surgery  . Rotator cuff repair  10/2008    left; Dr. Rennis Chris  . Esophagogastroduodenoscopy  10/31/08    normal; Dr. Gerilyn Pilgrim  . Tonsillectomy    . Hemorroidal banding  04/07/2013    x3-Dr.Eric Andrey Campanile  . Hemorrhoidectomy with hemorrhoid banding  04/07/13  . Spine surgery  04/2013    L4-5, L5-S1 fusion.  Dr. Franky Macho   History   Social History  . Marital Status: Married    Spouse Name: N/A    Number of Children: 2  . Years of Education: N/A   Occupational History  . Not on file.   Social History Main Topics  . Smoking status: Former Smoker    Types: Cigarettes    Quit date: 09/01/1977  . Smokeless tobacco: Never Used  . Alcohol Use: Yes     Comment: 1-2 glasses wine per day  . Drug Use: No  . Sexual Activity: Not on file   Other Topics Concern  . Not on file   Social History Narrative   Lives with wife, pet rats (new)   Outpatient Encounter Prescriptions as of 12/08/2013  Medication Sig Note  . aspirin 81 MG tablet Take 81 mg by mouth daily.     . Ferrous Sulfate (IRON) 325 (65 FE) MG TABS Take 1 tablet by mouth every other day.   . lisinopril (PRINIVIL,ZESTRIL) 30 MG tablet Take 1 tablet (30 mg total) by mouth daily.   Marland Kitchen omeprazole (PRILOSEC) 20 MG capsule Take 1 capsule (20 mg total) by mouth every other day.   . prochlorperazine (COMPAZINE) 5 MG tablet  12/08/2013: Using for sore throat from ER.  Ran out today  . psyllium (REGULOID) 0.52 G capsule Take 0.52 g by mouth daily.   Marland Kitchen SYNTHROID 50 MCG tablet Take 50 mcg by mouth daily before breakfast.    . amLODipine (NORVASC) 2.5 MG tablet Take 1 tablet (2.5 mg total) by mouth daily.   . fluticasone (FLONASE) 50 MCG/ACT nasal spray  12/08/2013: rx'd in ER at recent visit, only used it for a couple of days, stopped it because it wasn't really helping  . triamcinolone cream (KENALOG) 0.1 % Apply 1  application topically 2 (two) times daily. 12/08/2013: Uses prn  . [DISCONTINUED] amoxicillin (AMOXIL) 875 MG tablet Take 1 tablet (875 mg total) by mouth 2 (two) times daily.   . [DISCONTINUED] amoxicillin-clavulanate (AUGMENTIN) 875-125 MG per tablet Take 1 tablet by mouth 2 (two) times daily.    No Known Allergies  ROS:  Denies fevers, chills, weight changes, nausea, vomiting, bowel changes.  +fatigue, congestion, sore throat.  Denies cough, shortness of breath.  No further chest pain, no palpitations.  No myalgias.  No bleeding, bruising, rashes.  No decrease in libido, ED.  +anxiety, feeling a little down related to not feeling well.  See HPI and scanned sheet.  PHYSICAL EXAM: BP 130/90  Pulse 80  Temp(Src) 98.5 F (36.9 C) (Oral)  Ht 5\' 3"  (1.6 m)  Wt 136 lb (61.689 kg)  BMI 24.10 kg/m2 136/88 on repeat by MD, RA Elderly male, appearing his stated age, accompanied by his daughter and wife.  He is frequently clearing his throat.  No cough or hoarseness. He is in no distress.  No his usually happy, peppy self, but in no distress.  Mildly anxious/worried HEENT:  PERRL, EOMI, conjunctiva clear.  R TM with mild nonocclusive cerumen, TM is normal.  L TM normal (hearing aide removed for exam).  Nasal mucosa is moderately edematous, L>R, slightly red, clear mucus.  Sinuses nontender.  OP with some cobblestoning posteriorly (bilaterally) Neck: no lymphadenopathy, thyromegaly or mass Heart: regular rate and rhythm without murmur Lungs: Coarse breath sounds inferiorly; no wheezes, rales, ronchi Abdomen: soft, nontender, no organomegaly or mass Skin: no bruises, rashes Neuro: alert and oriented.  Seemed just a little confused, not as good of a historian as usual (difficulty remembering recent BP's, time he took xanax).  Normal strength, gait; cranial nerves intact Psych: mildly anxious, somewhat flattened affect.  Normal hygiene, grooming, speech and eye contact.  ASSESSMENT/PLAN:  Other malaise  and fatigue - likely related to URI/virus, and allergies.  reassured that Hg was normal on labs in ER.  check TSH  Unspecified hypothyroidism - last TSH 6 mos ago, normal; prefers repeat testing today given complaint of fatigue, since not checked with labs in ER - Plan: TSH  Acute pharyngitis - likely related to PND--contributed by virus and allergies  Allergic rhinitis, cause unspecified - discussed proper use of flonase, claritin  Essential hypertension, benign - suboptimally controlled.  restart amlodipine rx'd in ER, but just at 2.5mg ; titrate up at f/u if needed - Plan: amLODipine (NORVASC) 2.5 MG tablet  Anxiety state, unspecified - somewhat situational, worse related to illness, much worse while in ER and getting a lot of tests done.  usually controllable without meds  Solitary pulmonary nodule - 65mm LUL. will need f/u CT in 3 mos   Pharyngitis (sore throat) and runny nose/congestion--there likely is an allergic component, but also appears to have a viral component.  The viral component should improve over the next week; allergies (seasonal) can last a few months.  Restart the Flonase--2 sniffs in each nostril daily; gentle sniffs. You may use claritin daily, especially during the first week while waiting for the flonase to become effective.   You may use tylenol as needed for any headache or sore throat, along with salt water gargles, if needed.  Hypertension--your blood pressures were above goal even prior to this illness. It sounds as though you tolerated the amlodipine without problems.  I am going to restart the amlodipine at the lower dose (just one 2.5 mg tablet once daily).  This can be taken at the same time as the lisinopril.  If you find that your morning blood pressures (prior to the medications) are much higher than BP later in the day, you may choose to separate the times of taking the medications.  Return in 2 weeks.  Bring your list of blood pressures.  Return sooner if  having fevers, chest pain, discolored mucus, worsening headaches, shortness of breath, or any other concern.  Please call us, or email  [place] before getting on Google or WebMD.  Reviewed s/sx and criteria for depression--he does not meet criteria, only feeling down since onset of this illness.  He does have +family hx, and will need to f/u and ensure that moods improve.  We (wife, daughter) feel that his online research of his symptoms is exacerbating his anxiety, and have asked him to contact us with concerns, rather than doing these searches.  If ongoing fatigue, and thyroid is normal, consider checking testosterone in future.  pulm nodule on CT done in ER--records/imaging in computer reviewed, and no mention of nodule on prior CXR's.  Will need repeat CT in 3 mos for recheck to assess stability of nodule.  This visit was 50 minutes, more than 1/2 was spent counseling (re: symptoms, counseling for anxiety/depression, boredom, risks/side effects of meds, how to properly use Flonase, etc)

## 2013-12-08 NOTE — Patient Instructions (Signed)
  Pharyngitis (sore throat) and runny nose/congestion--there likely is an allergic component, but also appears to have a viral component.  The viral component should improve over the next week; allergies (seasonal) can last a few months.  Restart the Flonase--2 sniffs in each nostril daily; gentle sniffs. You may use claritin daily, especially during the first week while waiting for the flonase to become effective.   You may use tylenol as needed for any headache or sore throat, along with salt water gargles, if needed.  Hypertension--your blood pressures were above goal even prior to this illness. It sounds as though you tolerated the amlodipine without problems.  I am going to restart the amlodipine at the lower dose (just one 2.5 mg tablet once daily).  This can be taken at the same time as the lisinopril.  If you find that your morning blood pressures (prior to the medications) are much higher than BP later in the day, you may choose to separate the times of taking the medications.  Return in 2 weeks.  Bring your list of blood pressures.  Return sooner if having fevers, chest pain, discolored mucus, worsening headaches, shortness of breath, or any other concern.  Please call us, or email [place] before getting on Google or WebMD.

## 2013-12-09 LAB — TSH: TSH: 3.214 u[IU]/mL (ref 0.350–4.500)

## 2013-12-12 DIAGNOSIS — J3089 Other allergic rhinitis: Secondary | ICD-10-CM | POA: Diagnosis not present

## 2013-12-12 DIAGNOSIS — J309 Allergic rhinitis, unspecified: Secondary | ICD-10-CM | POA: Diagnosis not present

## 2013-12-12 DIAGNOSIS — J301 Allergic rhinitis due to pollen: Secondary | ICD-10-CM | POA: Diagnosis not present

## 2013-12-12 DIAGNOSIS — H1045 Other chronic allergic conjunctivitis: Secondary | ICD-10-CM | POA: Diagnosis not present

## 2013-12-21 DIAGNOSIS — J309 Allergic rhinitis, unspecified: Secondary | ICD-10-CM | POA: Diagnosis not present

## 2013-12-22 ENCOUNTER — Ambulatory Visit (INDEPENDENT_AMBULATORY_CARE_PROVIDER_SITE_OTHER): Payer: Medicare Other | Admitting: Family Medicine

## 2013-12-22 ENCOUNTER — Encounter: Payer: Self-pay | Admitting: Family Medicine

## 2013-12-22 VITALS — BP 126/84 | HR 80 | Ht 63.0 in | Wt 133.0 lb

## 2013-12-22 DIAGNOSIS — R911 Solitary pulmonary nodule: Secondary | ICD-10-CM | POA: Diagnosis not present

## 2013-12-22 DIAGNOSIS — R351 Nocturia: Secondary | ICD-10-CM | POA: Diagnosis not present

## 2013-12-22 DIAGNOSIS — I10 Essential (primary) hypertension: Secondary | ICD-10-CM

## 2013-12-22 DIAGNOSIS — J309 Allergic rhinitis, unspecified: Secondary | ICD-10-CM

## 2013-12-22 DIAGNOSIS — M25559 Pain in unspecified hip: Secondary | ICD-10-CM

## 2013-12-22 DIAGNOSIS — M25552 Pain in left hip: Secondary | ICD-10-CM

## 2013-12-22 DIAGNOSIS — M25551 Pain in right hip: Secondary | ICD-10-CM

## 2013-12-22 NOTE — Patient Instructions (Signed)
Continue the amlodipine and lisinopril.  You don't need to continue to monitor the BP quite as often--a couple of times each week is enough.  Do it more often if it is running high or low, or if you are feeling badly.    Drink your fluids earlier in the day; try and cut back some on your caffeine intake in order to decrease the frequency of going to the bathroom at night.  (ideally, drink 6-8 glasses of water during the day, prior to dinner).  Continue the flonase daily.  We will arrange your CT scan for July.

## 2013-12-22 NOTE — Progress Notes (Signed)
Chief Complaint  Patient presents with  . Follow-up    2 week follow up on fatigue.    Patient overall states he is feeling much better.  Fatigue has resolved, energy level is back to normal.  He has been taking the amlodipine 2.5mg  daily without side effects, and brings in list of his BP's.  They are running 116-174/72-105 (once was 175/104 and recheck was lower; averages 130/83, lowest after going to the gym).  He has been using the flonase daily.  He continues to have a little runny nose, and slightly sticky eyes (relieved by just wiping with water).  Not crusting shut, and they aren't itchy, just slightly gritty.  An OTC gel works very well, uses it prn.  Moods:  He reports feeling "much calmer".  Hasn't needed to take any xanax, and no longer feels down or depressed.  He is getting up 3-4 times/night to void.  He states he loses 3 pounds over night from voiding.  This has been going on for a couple of months.  Denies any change in fluid intake:  3-4 glasses of water, 3-4 cups of tea.  There is no urinary hesitancy, stream is strong and empties well.  He complains of some aching on bilateral lateral hips sometimes when he walks.  It is sporadic.  He is worried he is "outliving his hip joints".  BP 126/84  Pulse 80  Ht 5\' 3"  (1.6 m)  Wt 133 lb (60.328 kg)  BMI 23.57 kg/m2 Well developed, pleasant elderly male in no distress.  He is unaccompanied today HEENT:  PERRL, EOMI, nasal muosa shows mild edema, L>R Neck: no lymphadenopathy Heart: regular rate and rhythm, no murmur Lungs: clear bilaterally Abdomen: soft, nontender, no mass Psych: normal mood, affect, hygiene and grooming.  Normal speech, eye contact Extremities: FROM of hips without pain.  nontender at trochanteric bursa, ASIS; nontender throughout.   ASSESSMENT/PLAN:  Essential hypertension, benign - improved control  Nocturia  Hip pain, bilateral - likely strain/overuse related to exercise; reassured no evidence of  recurrent hip joint problems  Solitary pulmonary nodule - f/u CT due in July - Plan: CT Chest W Contrast  Allergic rhinitis, cause unspecified - improved   Urinary frequency--Drink earlier, cut back on caffeine.  If persistent/worsening, will need u/a, prostate exam   Pulmonary nodule (18mm LUL)--schedule CT for July to f/u  F/u in 6 mos for med check+/AWV, sooner prn

## 2013-12-27 ENCOUNTER — Encounter: Payer: Self-pay | Admitting: Family Medicine

## 2013-12-27 DIAGNOSIS — H2510 Age-related nuclear cataract, unspecified eye: Secondary | ICD-10-CM | POA: Diagnosis not present

## 2013-12-27 DIAGNOSIS — H52229 Regular astigmatism, unspecified eye: Secondary | ICD-10-CM | POA: Diagnosis not present

## 2013-12-27 DIAGNOSIS — H5200 Hypermetropia, unspecified eye: Secondary | ICD-10-CM | POA: Diagnosis not present

## 2013-12-27 DIAGNOSIS — H524 Presbyopia: Secondary | ICD-10-CM | POA: Diagnosis not present

## 2013-12-28 ENCOUNTER — Encounter: Payer: Self-pay | Admitting: Family Medicine

## 2013-12-28 DIAGNOSIS — J309 Allergic rhinitis, unspecified: Secondary | ICD-10-CM | POA: Diagnosis not present

## 2014-01-02 ENCOUNTER — Encounter: Payer: Self-pay | Admitting: Family Medicine

## 2014-01-02 ENCOUNTER — Ambulatory Visit (INDEPENDENT_AMBULATORY_CARE_PROVIDER_SITE_OTHER): Payer: Medicare Other | Admitting: Family Medicine

## 2014-01-02 ENCOUNTER — Other Ambulatory Visit: Payer: Self-pay | Admitting: Family Medicine

## 2014-01-02 VITALS — BP 102/62 | HR 100 | Ht 63.0 in | Wt 131.0 lb

## 2014-01-02 DIAGNOSIS — E039 Hypothyroidism, unspecified: Secondary | ICD-10-CM

## 2014-01-02 DIAGNOSIS — R5381 Other malaise: Secondary | ICD-10-CM | POA: Diagnosis not present

## 2014-01-02 DIAGNOSIS — Z79899 Other long term (current) drug therapy: Secondary | ICD-10-CM

## 2014-01-02 DIAGNOSIS — I10 Essential (primary) hypertension: Secondary | ICD-10-CM

## 2014-01-02 DIAGNOSIS — R5383 Other fatigue: Principal | ICD-10-CM

## 2014-01-02 DIAGNOSIS — F411 Generalized anxiety disorder: Secondary | ICD-10-CM

## 2014-01-02 DIAGNOSIS — R61 Generalized hyperhidrosis: Secondary | ICD-10-CM | POA: Diagnosis not present

## 2014-01-02 DIAGNOSIS — D509 Iron deficiency anemia, unspecified: Secondary | ICD-10-CM | POA: Diagnosis not present

## 2014-01-02 LAB — CBC WITH DIFFERENTIAL/PLATELET
BASOS PCT: 0 % (ref 0–1)
Basophils Absolute: 0 10*3/uL (ref 0.0–0.1)
EOS ABS: 0.2 10*3/uL (ref 0.0–0.7)
EOS PCT: 2 % (ref 0–5)
HEMATOCRIT: 34.8 % — AB (ref 39.0–52.0)
HEMOGLOBIN: 12.1 g/dL — AB (ref 13.0–17.0)
LYMPHS ABS: 1.2 10*3/uL (ref 0.7–4.0)
Lymphocytes Relative: 14 % (ref 12–46)
MCH: 27.5 pg (ref 26.0–34.0)
MCHC: 34.8 g/dL (ref 30.0–36.0)
MCV: 79.1 fL (ref 78.0–100.0)
MONO ABS: 1.1 10*3/uL — AB (ref 0.1–1.0)
MONOS PCT: 12 % (ref 3–12)
Neutro Abs: 6.3 10*3/uL (ref 1.7–7.7)
Neutrophils Relative %: 72 % (ref 43–77)
Platelets: 417 10*3/uL — ABNORMAL HIGH (ref 150–400)
RBC: 4.4 MIL/uL (ref 4.22–5.81)
RDW: 13.8 % (ref 11.5–15.5)
WBC: 8.8 10*3/uL (ref 4.0–10.5)

## 2014-01-02 MED ORDER — ALPRAZOLAM 0.25 MG PO TABS: 0.2500 mg | ORAL_TABLET | Freq: Three times a day (TID) | ORAL | Status: DC | PRN

## 2014-01-02 NOTE — Patient Instructions (Signed)
We will be in touch with all of your lab results.  Your allergies might be contributing to your fatigue (especially with the amount of coughing you are having).  Increase the Robitussin DM to as frequent as you're supposed to take it (read bottle) or change to Mucinex DM, getting the 12 hour version, taking it twice daily.  Continue the claritin and flonase at 2 sprays each nostril daily.  I agree with your idea to see Marya Amsler for counseling, because anxiety, and possibly depression are a component to why you are here today.

## 2014-01-02 NOTE — Progress Notes (Signed)
Chief Complaint  Patient presents with  . Advice Only    patient is having some emotional issues(see note that he typed on chart).    Summary of patient's typed note (written by him on 5/3): Pt reports feeling excessively fatigued over the last 7-10 days.  He has no energy, hasn't been able to get to the gym, sometimes doesn't even want to get out of bed in the morning.  He is sleeping during the day.  He has had some decreased appetite.  For the last 3 days he has had some increased sweating while sleeping at night.  For the last 2 days, after eating a piece of toast for breakfast, shortly after he goes into "spasms", further described as where his whole body shakes, he feels "out of control".  Described as tremulous, NOT seizure-like activity.  He has taken one of his wife's xanax, slept for 2-3 hours, and then woke up feeling better, but still with decreased energy.  Patient sent a note via MyChart last week, but was never seen by me (asking if Marya Amsler could help him)--his note was never forwarded to me.  Last visit here was 4/23, at which point he was feeling much better. In the interim he has taken a turn for the worse, and is back to having no energy, with episodes of anxiety.  He wants to sleep all the time. No energy to go to the gym, no "spark" or interest in things. He doesn't snore. Having some night sweats for the last 2-3 nights. Decreased appetite x 2-3 weeks  BP's have been running 130's/80, pulse in 80's.  Some SOB--in that he feels like he needs to take deep breaths/sigh; denies being winded with any activity.  No chest pain  Allergies are flaring currently--constant cough and runny nose.  Doesn't cough at night.  Using Flonase and claritin daily.  He is using Robitussin (?DM) 2-3 times/day  Past Medical History  Diagnosis Date  . Hypertension   . Unspecified hypothyroidism   . Mitral valve prolapse     h/o; normal echo 12/2011 with no MVP seen  . Foot drop, right   .  BPH (benign prostatic hypertrophy)   . Iron deficiency anemia, unspecified 6/09  . Hearing loss in left ear   . Diverticulosis of colon 12/09  . Internal hemorrhoids   . GERD (gastroesophageal reflux disease)   . Allergic rhinitis, cause unspecified     on allergy shots (Dr. Orvil Feil)  . Allergic conjunctivitis    Past Surgical History  Procedure Laterality Date  . Prostate surgery  2007    photovaporization  . Cervical laminectomy  1972    C5-6  . Spine surgery  2006    L4-5 disk surgery  . Rotator cuff repair  10/2008    left; Dr. Onnie Graham  . Esophagogastroduodenoscopy  10/31/08    normal; Dr. Edison Nasuti  . Tonsillectomy    . Hemorroidal banding  04/07/2013    x3-Dr.Eric Redmond Pulling  . Hemorrhoidectomy with hemorrhoid banding  04/07/13  . Spine surgery  04/2013    L4-5, L5-S1 fusion.  Dr. Christella Noa   History   Social History  . Marital Status: Married    Spouse Name: N/A    Number of Children: 2  . Years of Education: N/A   Occupational History  . Not on file.   Social History Main Topics  . Smoking status: Former Smoker    Types: Cigarettes    Quit date: 09/01/1977  . Smokeless tobacco:  Never Used  . Alcohol Use: Yes     Comment: 1-2 glasses wine per day  . Drug Use: No  . Sexual Activity: Not on file   Other Topics Concern  . Not on file   Social History Narrative   Lives with wife, pet rats (new)   Outpatient Encounter Prescriptions as of 01/02/2014  Medication Sig Note  . amLODipine (NORVASC) 2.5 MG tablet Take 1 tablet (2.5 mg total) by mouth daily.   Marland Kitchen aspirin 81 MG tablet Take 81 mg by mouth daily.     . cetirizine (ZYRTEC) 10 MG tablet Take 10 mg by mouth daily.   . Ferrous Sulfate (IRON) 325 (65 FE) MG TABS Take 1 tablet by mouth every other day.   . fluticasone (FLONASE) 50 MCG/ACT nasal spray  12/22/2013: Using daily, 1 spray each nostril  . lisinopril (PRINIVIL,ZESTRIL) 30 MG tablet Take 1 tablet (30 mg total) by mouth daily.   Marland Kitchen omeprazole (PRILOSEC) 20 MG  capsule Take 1 capsule (20 mg total) by mouth every other day.   . psyllium (REGULOID) 0.52 G capsule Take 0.52 g by mouth daily.   Marland Kitchen SYNTHROID 50 MCG tablet Take 50 mcg by mouth daily before breakfast.    . triamcinolone cream (KENALOG) 0.1 % Apply 1 application topically 2 (two) times daily. 12/08/2013: Uses prn  . ALPRAZolam (XANAX) 0.25 MG tablet Take 1 tablet (0.25 mg total) by mouth 3 (three) times daily as needed for anxiety.    No Known Allergies  ROS:  Denies fevers, chills, headaches, dizziness, chest pain.  Denies nausea, vomiting, diarrhea, blood in stool, abdominal pain or other complaints except as noted in HPI.  +fatigue, depression anxiety, tremulous, allergies.  No bleeding/bruising, rash  PHYSICAL EXAM: BP 102/62  Pulse 100  Ht _0  (1.6 m)  Wt 131 lb (59.421 kg)  BMI 23.21 kg/m2 116/70 on repeat by MD Pleasant elderly male, appearing mildly depressed, flat affect, accompanied by his wife HEENT:  PERRL, EOMI, conjunctiva clear.  TM's and EACs normal. Nasal mucosa mildly edematous, no erythema or purulence. OP is clear, moist mucus membrane Neck: no lymphadenopathy, thyromegaly Heart: regular rate and rhythm Lungs: clear bilaterally Abdomen: soft, nontender, no mass Extremities: no edema Psych: depressed, flat affect, somewhat anxious.  Normal eye contact, speech, hygiene and grooming Neuro: alert and oriented.  Cranial nerves intact. Normal strength, gait  ASSESSMENT/PLAN:  Other malaise and fatigue - seems to be intermittent, but severe.  normal TSH recently.  check labs; consider allergies contributing, possible depression.  +anxiety - Plan: Comprehensive metabolic panel, CBC with Differential, Vit D  25 hydroxy (rtn osteoporosis monitoring), Testosterone  Encounter for long-term (current) use of other medications - Plan: Comprehensive metabolic panel, CBC with Differential, Vit D  25 hydroxy (rtn osteoporosis monitoring)  Night sweats - very mild for the last  couple of nights (when warmer).  no lymphadenopathy.  check CBC - Plan: Comprehensive metabolic panel, CBC with Differential, Vit D  25 hydroxy (rtn osteoporosis monitoring)  Anxiety state, unspecified - risks/side effects of xanax reviewed.  Has taken wife's with improvement in symptoms.  He plans to see Marya Amsler for counseling - Plan: ALPRAZolam Duanne Moron) 0.25 MG tablet  Essential hypertension, benign - well controlled  Unspecified hypothyroidism - adequately replaced per recent labs  Iron deficiency anemia, unspecified - h/o in past, but recent CBC's have been normal.  he takes iron qod.  Will repeat CBC today   CBC, c-met, testosterone, vitamin D (TSH was ok  last month)  We will be in touch with all of your lab results.  Your allergies might be contributing to your fatigue (especially with the amount of coughing you are having).  Increase the Robitussin DM to as frequent as you're supposed to take it (read bottle) or change to Mucinex DM, getting the 12 hour version, taking it twice daily.  Continue the claritin and flonase at 2 sprays each nostril daily.  I agree with your idea to see Marya Amsler for counseling, because anxiety, and possibly depression are a component to why you are here today.

## 2014-01-03 ENCOUNTER — Encounter: Payer: Self-pay | Admitting: Family Medicine

## 2014-01-03 LAB — IRON: Iron: 33 ug/dL — ABNORMAL LOW (ref 42–165)

## 2014-01-03 LAB — COMPREHENSIVE METABOLIC PANEL
ALK PHOS: 77 U/L (ref 39–117)
ALT: 20 U/L (ref 0–53)
AST: 23 U/L (ref 0–37)
Albumin: 3.1 g/dL — ABNORMAL LOW (ref 3.5–5.2)
BILIRUBIN TOTAL: 0.4 mg/dL (ref 0.2–1.2)
BUN: 16 mg/dL (ref 6–23)
CO2: 24 mEq/L (ref 19–32)
Calcium: 8.9 mg/dL (ref 8.4–10.5)
Chloride: 97 mEq/L (ref 96–112)
Creat: 0.98 mg/dL (ref 0.50–1.35)
GLUCOSE: 92 mg/dL (ref 70–99)
POTASSIUM: 4.8 meq/L (ref 3.5–5.3)
Sodium: 131 mEq/L — ABNORMAL LOW (ref 135–145)
Total Protein: 6.9 g/dL (ref 6.0–8.3)

## 2014-01-03 LAB — TESTOSTERONE: Testosterone: 399 ng/dL (ref 300–890)

## 2014-01-03 LAB — VITAMIN D 25 HYDROXY (VIT D DEFICIENCY, FRACTURES): Vit D, 25-Hydroxy: 34 ng/mL (ref 30–89)

## 2014-01-03 LAB — FERRITIN: FERRITIN: 203 ng/mL (ref 22–322)

## 2014-01-03 NOTE — Progress Notes (Signed)
Discussed with Keba.

## 2014-01-04 ENCOUNTER — Telehealth: Payer: Self-pay | Admitting: Family Medicine

## 2014-01-04 DIAGNOSIS — J309 Allergic rhinitis, unspecified: Secondary | ICD-10-CM | POA: Diagnosis not present

## 2014-01-04 NOTE — Telephone Encounter (Signed)
1:40pm LMOVM--will try again later

## 2014-01-05 NOTE — Telephone Encounter (Signed)
01/05/14 @ 8:45--LMOVM, will try again

## 2014-01-05 NOTE — Telephone Encounter (Signed)
Spoke with daughter. Answered her questions (why I decided to wait on starting antidepressants--concern that any side effect would make him feel worse, more helpless; prefer to meet with Almyra Free a couple of times first, and see how that goes). She was worried when he said he wouldn't go to [place] to his granddaughters graduation from nursing school--his wife eventually said that they were going to go (she was going to make him).  When engaged, he perks up.  Tried going for a walk, but hips hurt him.  Has appt scheduled with Dr. Rhona Raider.  Daughter's concerns were heard. Advised for him to f/u here in 1-2 weeks if not improving at all

## 2014-01-05 NOTE — Telephone Encounter (Signed)
Ivin Booty called and said she missed your phone call this morning cause her phone is on silent. She wants you to call the office @ [phone] and ask for her

## 2014-01-09 ENCOUNTER — Encounter: Payer: Self-pay | Admitting: Family Medicine

## 2014-01-09 ENCOUNTER — Other Ambulatory Visit: Payer: Self-pay | Admitting: Family Medicine

## 2014-01-09 ENCOUNTER — Ambulatory Visit (INDEPENDENT_AMBULATORY_CARE_PROVIDER_SITE_OTHER): Payer: Medicare Other | Admitting: Licensed Clinical Social Worker

## 2014-01-09 ENCOUNTER — Telehealth: Payer: Self-pay | Admitting: Family Medicine

## 2014-01-09 DIAGNOSIS — F321 Major depressive disorder, single episode, moderate: Secondary | ICD-10-CM | POA: Diagnosis not present

## 2014-01-09 DIAGNOSIS — F3289 Other specified depressive episodes: Secondary | ICD-10-CM

## 2014-01-09 DIAGNOSIS — F329 Major depressive disorder, single episode, unspecified: Secondary | ICD-10-CM

## 2014-01-09 MED ORDER — CITALOPRAM HYDROBROMIDE 10 MG PO TABS: ORAL_TABLET | ORAL | Status: DC

## 2014-01-09 NOTE — Telephone Encounter (Signed)
Spoke with Danny Frederick, who feels he will benefit from a low dose SSRI.  Pt already sent a MyChart message today--responded to pt suggesting starting low dose med (10mg  citalopram, start at 1/2 tablet for a week, longer at 1/2 tablet if any side effects).

## 2014-01-11 DIAGNOSIS — J309 Allergic rhinitis, unspecified: Secondary | ICD-10-CM | POA: Diagnosis not present

## 2014-01-11 DIAGNOSIS — M545 Low back pain, unspecified: Secondary | ICD-10-CM | POA: Diagnosis not present

## 2014-01-18 DIAGNOSIS — J309 Allergic rhinitis, unspecified: Secondary | ICD-10-CM | POA: Diagnosis not present

## 2014-01-20 ENCOUNTER — Other Ambulatory Visit: Payer: Self-pay | Admitting: Family Medicine

## 2014-01-20 ENCOUNTER — Telehealth: Payer: Self-pay | Admitting: Family Medicine

## 2014-01-20 DIAGNOSIS — F411 Generalized anxiety disorder: Secondary | ICD-10-CM

## 2014-01-20 MED ORDER — ALPRAZOLAM 0.25 MG PO TABS: 0.2500 mg | ORAL_TABLET | Freq: Three times a day (TID) | ORAL | Status: DC | PRN

## 2014-01-20 NOTE — Telephone Encounter (Signed)
Last rx'd #20 on 5/4.  Okay to refill #20, no refill.  He appt scheduled in June.  Please call in

## 2014-01-20 NOTE — Telephone Encounter (Signed)
Medication(Xanax)  was called out to his pharmacy per Dr. Rita Ohara. CLS

## 2014-01-20 NOTE — Telephone Encounter (Signed)
Pt called and requested refills on Xanax. Please send to [facility].

## 2014-01-22 ENCOUNTER — Telehealth: Payer: Self-pay | Admitting: Family Medicine

## 2014-01-22 NOTE — Telephone Encounter (Signed)
Danny Frederick was at her parents house (and mom was also speaking in [employer]). She is concerned about her dad.  This morning he seemed very confused, unsteady when walking to the bathroom. He got to the bathroom, then went back to bed, without ever voiding; wife made him go back. Seemed confused.  He has been very anxious the last couple of mornings, shaking.  He took xanax last night around 11, and again this morning around 7.  Currently he is sleeping.  He was awake when she got there, and was talking, able to walk, respond to commands.  He has been very depressed, flat, and "seems 80 years old".  He seemed a little better yesterday--she had him go outside, and at one point he even got on the computer.  Hasn't had much energy, remains fatigued.  He has been taking the full citalopram tablet now for about 6 days.  She got him some Ensure, as she is worried that he isn't eating enough. His wife reports that he did eat a little better yesterday.  We discussed concern over potential mental status changes--related to meds, but cannot r/o other causes (UTI, low sodium--recently abnormal and due for recheck), vs TIA.  They almost called 911 earlier today, he refused/got mad.  Georgiann Mohs that if he still is not acting like himself, to go to ER today (they can bring him if he is able to get in car) for further evaluation.  Cut back dose of xanax to only 1/2 tablet during the day, and only if needed

## 2014-01-23 ENCOUNTER — Emergency Department (HOSPITAL_COMMUNITY): Payer: Medicare Other

## 2014-01-23 ENCOUNTER — Encounter (HOSPITAL_COMMUNITY): Payer: Self-pay | Admitting: Anesthesiology

## 2014-01-23 ENCOUNTER — Encounter (HOSPITAL_COMMUNITY)
Admission: EM | Disposition: A | Discharge status: Home Health | Payer: Medicare Other | Source: Home / Self Care | Attending: Thoracic Surgery (Cardiothoracic Vascular Surgery)

## 2014-01-23 ENCOUNTER — Encounter (HOSPITAL_COMMUNITY): Payer: Self-pay | Admitting: Emergency Medicine

## 2014-01-23 ENCOUNTER — Inpatient Hospital Stay (HOSPITAL_COMMUNITY)
Admission: EM | Admit: 2014-01-23 | Discharge: 2014-02-04 | DRG: 219 | Disposition: A | Discharge status: Home Health | Payer: Medicare Other | Attending: Thoracic Surgery (Cardiothoracic Vascular Surgery) | Admitting: Thoracic Surgery (Cardiothoracic Vascular Surgery)

## 2014-01-23 ENCOUNTER — Inpatient Hospital Stay (HOSPITAL_COMMUNITY): Payer: Medicare Other | Admitting: Anesthesiology

## 2014-01-23 ENCOUNTER — Emergency Department (HOSPITAL_COMMUNITY): Payer: Medicare Other | Admitting: Anesthesiology

## 2014-01-23 DIAGNOSIS — Z8052 Family history of malignant neoplasm of bladder: Secondary | ICD-10-CM

## 2014-01-23 DIAGNOSIS — I214 Non-ST elevation (NSTEMI) myocardial infarction: Secondary | ICD-10-CM

## 2014-01-23 DIAGNOSIS — D62 Acute posthemorrhagic anemia: Secondary | ICD-10-CM | POA: Diagnosis not present

## 2014-01-23 DIAGNOSIS — I2699 Other pulmonary embolism without acute cor pulmonale: Secondary | ICD-10-CM | POA: Diagnosis not present

## 2014-01-23 DIAGNOSIS — A409 Streptococcal sepsis, unspecified: Secondary | ICD-10-CM | POA: Diagnosis not present

## 2014-01-23 DIAGNOSIS — R5383 Other fatigue: Secondary | ICD-10-CM | POA: Diagnosis not present

## 2014-01-23 DIAGNOSIS — I776 Arteritis, unspecified: Secondary | ICD-10-CM | POA: Diagnosis not present

## 2014-01-23 DIAGNOSIS — A419 Sepsis, unspecified organism: Secondary | ICD-10-CM | POA: Diagnosis not present

## 2014-01-23 DIAGNOSIS — F329 Major depressive disorder, single episode, unspecified: Secondary | ICD-10-CM | POA: Diagnosis present

## 2014-01-23 DIAGNOSIS — Z794 Long term (current) use of insulin: Secondary | ICD-10-CM | POA: Diagnosis not present

## 2014-01-23 DIAGNOSIS — I10 Essential (primary) hypertension: Secondary | ICD-10-CM | POA: Diagnosis present

## 2014-01-23 DIAGNOSIS — J9819 Other pulmonary collapse: Secondary | ICD-10-CM | POA: Diagnosis not present

## 2014-01-23 DIAGNOSIS — I519 Heart disease, unspecified: Secondary | ICD-10-CM | POA: Diagnosis present

## 2014-01-23 DIAGNOSIS — Z981 Arthrodesis status: Secondary | ICD-10-CM

## 2014-01-23 DIAGNOSIS — Z87891 Personal history of nicotine dependence: Secondary | ICD-10-CM | POA: Diagnosis not present

## 2014-01-23 DIAGNOSIS — R0989 Other specified symptoms and signs involving the circulatory and respiratory systems: Secondary | ICD-10-CM | POA: Diagnosis not present

## 2014-01-23 DIAGNOSIS — I951 Orthostatic hypotension: Secondary | ICD-10-CM | POA: Diagnosis not present

## 2014-01-23 DIAGNOSIS — R4701 Aphasia: Secondary | ICD-10-CM | POA: Diagnosis not present

## 2014-01-23 DIAGNOSIS — M25519 Pain in unspecified shoulder: Secondary | ICD-10-CM | POA: Diagnosis not present

## 2014-01-23 DIAGNOSIS — Y832 Surgical operation with anastomosis, bypass or graft as the cause of abnormal reaction of the patient, or of later complication, without mention of misadventure at the time of the procedure: Secondary | ICD-10-CM | POA: Diagnosis not present

## 2014-01-23 DIAGNOSIS — R197 Diarrhea, unspecified: Secondary | ICD-10-CM | POA: Diagnosis present

## 2014-01-23 DIAGNOSIS — N400 Enlarged prostate without lower urinary tract symptoms: Secondary | ICD-10-CM | POA: Diagnosis present

## 2014-01-23 DIAGNOSIS — Z8249 Family history of ischemic heart disease and other diseases of the circulatory system: Secondary | ICD-10-CM

## 2014-01-23 DIAGNOSIS — E872 Acidosis, unspecified: Secondary | ICD-10-CM | POA: Diagnosis not present

## 2014-01-23 DIAGNOSIS — R5381 Other malaise: Secondary | ICD-10-CM | POA: Diagnosis not present

## 2014-01-23 DIAGNOSIS — I711 Thoracic aortic aneurysm, ruptured, unspecified: Principal | ICD-10-CM | POA: Diagnosis present

## 2014-01-23 DIAGNOSIS — E876 Hypokalemia: Secondary | ICD-10-CM | POA: Diagnosis not present

## 2014-01-23 DIAGNOSIS — E871 Hypo-osmolality and hyponatremia: Secondary | ICD-10-CM | POA: Diagnosis present

## 2014-01-23 DIAGNOSIS — I079 Rheumatic tricuspid valve disease, unspecified: Secondary | ICD-10-CM | POA: Diagnosis present

## 2014-01-23 DIAGNOSIS — D696 Thrombocytopenia, unspecified: Secondary | ICD-10-CM | POA: Diagnosis present

## 2014-01-23 DIAGNOSIS — Z7982 Long term (current) use of aspirin: Secondary | ICD-10-CM | POA: Diagnosis not present

## 2014-01-23 DIAGNOSIS — Z833 Family history of diabetes mellitus: Secondary | ICD-10-CM | POA: Diagnosis not present

## 2014-01-23 DIAGNOSIS — Z823 Family history of stroke: Secondary | ICD-10-CM | POA: Diagnosis not present

## 2014-01-23 DIAGNOSIS — I719 Aortic aneurysm of unspecified site, without rupture: Secondary | ICD-10-CM | POA: Diagnosis not present

## 2014-01-23 DIAGNOSIS — K219 Gastro-esophageal reflux disease without esophagitis: Secondary | ICD-10-CM | POA: Diagnosis present

## 2014-01-23 DIAGNOSIS — A491 Streptococcal infection, unspecified site: Secondary | ICD-10-CM | POA: Diagnosis not present

## 2014-01-23 DIAGNOSIS — R0602 Shortness of breath: Secondary | ICD-10-CM | POA: Diagnosis not present

## 2014-01-23 DIAGNOSIS — B955 Unspecified streptococcus as the cause of diseases classified elsewhere: Secondary | ICD-10-CM

## 2014-01-23 DIAGNOSIS — Z7901 Long term (current) use of anticoagulants: Secondary | ICD-10-CM

## 2014-01-23 DIAGNOSIS — Y9210 Unspecified residential institution as the place of occurrence of the external cause: Secondary | ICD-10-CM | POA: Diagnosis not present

## 2014-01-23 DIAGNOSIS — D689 Coagulation defect, unspecified: Secondary | ICD-10-CM | POA: Diagnosis present

## 2014-01-23 DIAGNOSIS — Z8042 Family history of malignant neoplasm of prostate: Secondary | ICD-10-CM | POA: Diagnosis not present

## 2014-01-23 DIAGNOSIS — R7881 Bacteremia: Secondary | ICD-10-CM | POA: Diagnosis not present

## 2014-01-23 DIAGNOSIS — F322 Major depressive disorder, single episode, severe without psychotic features: Secondary | ICD-10-CM

## 2014-01-23 DIAGNOSIS — I38 Endocarditis, valve unspecified: Secondary | ICD-10-CM | POA: Diagnosis not present

## 2014-01-23 DIAGNOSIS — I639 Cerebral infarction, unspecified: Secondary | ICD-10-CM

## 2014-01-23 DIAGNOSIS — F29 Unspecified psychosis not due to a substance or known physiological condition: Secondary | ICD-10-CM | POA: Diagnosis not present

## 2014-01-23 DIAGNOSIS — J90 Pleural effusion, not elsewhere classified: Secondary | ICD-10-CM | POA: Diagnosis not present

## 2014-01-23 DIAGNOSIS — IMO0002 Reserved for concepts with insufficient information to code with codable children: Secondary | ICD-10-CM | POA: Diagnosis not present

## 2014-01-23 DIAGNOSIS — R45851 Suicidal ideations: Secondary | ICD-10-CM | POA: Diagnosis not present

## 2014-01-23 DIAGNOSIS — J189 Pneumonia, unspecified organism: Secondary | ICD-10-CM

## 2014-01-23 DIAGNOSIS — M6281 Muscle weakness (generalized): Secondary | ICD-10-CM

## 2014-01-23 DIAGNOSIS — B953 Streptococcus pneumoniae as the cause of diseases classified elsewhere: Secondary | ICD-10-CM | POA: Diagnosis not present

## 2014-01-23 HISTORY — DX: Thoracic aortic aneurysm, ruptured, unspecified: I71.10

## 2014-01-23 HISTORY — PX: THORACIC AORTIC ANEURYSM REPAIR: SHX799

## 2014-01-23 HISTORY — DX: Thoracic aortic aneurysm, ruptured: I71.1

## 2014-01-23 HISTORY — DX: Other pulmonary embolism without acute cor pulmonale: I26.99

## 2014-01-23 HISTORY — DX: Heart disease, unspecified: I51.9

## 2014-01-23 LAB — URINALYSIS, ROUTINE W REFLEX MICROSCOPIC
GLUCOSE, UA: NEGATIVE mg/dL
HGB URINE DIPSTICK: NEGATIVE
Ketones, ur: NEGATIVE mg/dL
Leukocytes, UA: NEGATIVE
Nitrite: NEGATIVE
PH: 5 (ref 5.0–8.0)
Protein, ur: NEGATIVE mg/dL
Specific Gravity, Urine: 1.025 (ref 1.005–1.030)
Urobilinogen, UA: 1 mg/dL (ref 0.0–1.0)

## 2014-01-23 LAB — COMPREHENSIVE METABOLIC PANEL
ALT: 42 U/L (ref 0–53)
AST: 40 U/L — AB (ref 0–37)
Albumin: 2.3 g/dL — ABNORMAL LOW (ref 3.5–5.2)
Alkaline Phosphatase: 265 U/L — ABNORMAL HIGH (ref 39–117)
BILIRUBIN TOTAL: 0.3 mg/dL (ref 0.3–1.2)
BUN: 39 mg/dL — AB (ref 6–23)
CHLORIDE: 89 meq/L — AB (ref 96–112)
CO2: 22 meq/L (ref 19–32)
CREATININE: 1.22 mg/dL (ref 0.50–1.35)
Calcium: 9.4 mg/dL (ref 8.4–10.5)
GFR calc Af Amer: 64 mL/min — ABNORMAL LOW (ref >90)
GFR, EST NON AFRICAN AMERICAN: 55 mL/min — AB (ref >90)
Glucose, Bld: 137 mg/dL — ABNORMAL HIGH (ref 70–99)
POTASSIUM: 4.4 meq/L (ref 3.7–5.3)
Sodium: 131 mEq/L — ABNORMAL LOW (ref 137–147)
Total Protein: 7.5 g/dL (ref 6.0–8.3)

## 2014-01-23 LAB — CBC WITH DIFFERENTIAL/PLATELET
BASOS ABS: 0 10*3/uL (ref 0.0–0.1)
Basophils Relative: 0 % (ref 0–1)
EOS PCT: 0 % (ref 0–5)
Eosinophils Absolute: 0 10*3/uL (ref 0.0–0.7)
HCT: 36.2 % — ABNORMAL LOW (ref 39.0–52.0)
Hemoglobin: 12.4 g/dL — ABNORMAL LOW (ref 13.0–17.0)
LYMPHS PCT: 7 % — AB (ref 12–46)
Lymphs Abs: 1.8 10*3/uL (ref 0.7–4.0)
MCH: 26.7 pg (ref 26.0–34.0)
MCHC: 34.3 g/dL (ref 30.0–36.0)
MCV: 77.8 fL — ABNORMAL LOW (ref 78.0–100.0)
MONOS PCT: 6 % (ref 3–12)
Monocytes Absolute: 1.6 10*3/uL — ABNORMAL HIGH (ref 0.1–1.0)
NEUTROS ABS: 22.7 10*3/uL — AB (ref 1.7–7.7)
Neutrophils Relative %: 87 % — ABNORMAL HIGH (ref 43–77)
Platelets: 587 10*3/uL — ABNORMAL HIGH (ref 150–400)
RBC: 4.65 MIL/uL (ref 4.22–5.81)
RDW: 13.5 % (ref 11.5–15.5)
WBC: 26.1 10*3/uL — AB (ref 4.0–10.5)

## 2014-01-23 LAB — GRAM STAIN

## 2014-01-23 LAB — HEMOGLOBIN AND HEMATOCRIT, BLOOD
HEMATOCRIT: 24.8 % — AB (ref 39.0–52.0)
HEMOGLOBIN: 8.4 g/dL — AB (ref 13.0–17.0)

## 2014-01-23 LAB — I-STAT TROPONIN, ED: Troponin i, poc: 0.11 ng/mL (ref 0.00–0.08)

## 2014-01-23 LAB — FIBRINOGEN: Fibrinogen: 371 mg/dL (ref 204–475)

## 2014-01-23 LAB — PROTIME-INR
INR: 1.19 (ref 0.00–1.49)
Prothrombin Time: 14.8 seconds (ref 11.6–15.2)

## 2014-01-23 LAB — I-STAT CG4 LACTIC ACID, ED: Lactic Acid, Venous: 2.33 mmol/L — ABNORMAL HIGH (ref 0.5–2.2)

## 2014-01-23 LAB — APTT: APTT: 32 s (ref 24–37)

## 2014-01-23 LAB — PREPARE RBC (CROSSMATCH)

## 2014-01-23 SURGERY — REPAIR, ANEURYSM, AORTA, THORACIC, ASCENDING
Anesthesia: General

## 2014-01-23 MED ORDER — PHENYLEPHRINE HCL 10 MG/ML IJ SOLN
30.0000 ug/min | INTRAVENOUS | Status: DC
  Filled 2014-01-23: qty 2

## 2014-01-23 MED ORDER — LIDOCAINE HCL (CARDIAC) 20 MG/ML IV SOLN
INTRAVENOUS | Status: AC
  Filled 2014-01-23: qty 5

## 2014-01-23 MED ORDER — 0.9 % SODIUM CHLORIDE (POUR BTL) OPTIME
TOPICAL | Status: DC | PRN
  Administered 2014-01-23: 1000 mL

## 2014-01-23 MED ORDER — EPINEPHRINE HCL 1 MG/ML IJ SOLN
0.5000 ug/min | INTRAVENOUS | Status: DC
  Filled 2014-01-23: qty 4

## 2014-01-23 MED ORDER — ROCURONIUM BROMIDE 100 MG/10ML IV SOLN
INTRAVENOUS | Status: DC | PRN
  Administered 2014-01-23 – 2014-01-24 (×4): 50 mg via INTRAVENOUS

## 2014-01-23 MED ORDER — NITROGLYCERIN IN D5W 200-5 MCG/ML-% IV SOLN
2.0000 ug/min | INTRAVENOUS | Status: AC
  Administered 2014-01-23: 30 ug/min via INTRAVENOUS
  Filled 2014-01-23: qty 250

## 2014-01-23 MED ORDER — MAGNESIUM SULFATE 50 % IJ SOLN
40.0000 meq | INTRAMUSCULAR | Status: DC
  Filled 2014-01-23: qty 10

## 2014-01-23 MED ORDER — SODIUM CHLORIDE 0.9 % IV BOLUS (SEPSIS)
1000.0000 mL | Freq: Once | INTRAVENOUS | Status: AC
  Administered 2014-01-23: 1000 mL via INTRAVENOUS

## 2014-01-23 MED ORDER — MIDAZOLAM HCL 10 MG/2ML IJ SOLN
INTRAMUSCULAR | Status: AC
  Filled 2014-01-23: qty 2

## 2014-01-23 MED ORDER — HEPARIN SODIUM (PORCINE) 1000 UNIT/ML IJ SOLN
INTRAMUSCULAR | Status: AC
  Filled 2014-01-23: qty 1

## 2014-01-23 MED ORDER — LIDOCAINE HCL (CARDIAC) 20 MG/ML IV SOLN
INTRAVENOUS | Status: DC | PRN
  Administered 2014-01-23: 100 mg via INTRAVENOUS

## 2014-01-23 MED ORDER — LACTATED RINGERS IV SOLN
INTRAVENOUS | Status: DC | PRN
Start: 2014-01-23 — End: 2014-01-23

## 2014-01-23 MED ORDER — DEXTROSE 5 % IV SOLN
750.0000 mg | INTRAVENOUS | Status: DC
  Filled 2014-01-23: qty 750

## 2014-01-23 MED ORDER — ROCURONIUM BROMIDE 50 MG/5ML IV SOLN
INTRAVENOUS | Status: AC
  Filled 2014-01-23: qty 2

## 2014-01-23 MED ORDER — METHYLPREDNISOLONE SODIUM SUCC 125 MG IJ SOLR
INTRAMUSCULAR | Status: AC
  Filled 2014-01-23: qty 2

## 2014-01-23 MED ORDER — PLASMA-LYTE 148 IV SOLN
INTRAVENOUS | Status: AC
  Administered 2014-01-23: 20:00:00
  Filled 2014-01-23: qty 2.5

## 2014-01-23 MED ORDER — VANCOMYCIN HCL 10 G IV SOLR
1250.0000 mg | INTRAVENOUS | Status: AC
  Administered 2014-01-23: 1250 mg via INTRAVENOUS
  Filled 2014-01-23: qty 1250

## 2014-01-23 MED ORDER — DEXMEDETOMIDINE HCL IN NACL 400 MCG/100ML IV SOLN
0.1000 ug/kg/h | INTRAVENOUS | Status: AC
  Administered 2014-01-23: 0.2 ug/kg/h via INTRAVENOUS
  Filled 2014-01-23: qty 100

## 2014-01-23 MED ORDER — MIDAZOLAM HCL 5 MG/5ML IJ SOLN
INTRAMUSCULAR | Status: DC | PRN
  Administered 2014-01-23: 2 mg via INTRAVENOUS
  Administered 2014-01-23: 1 mg via INTRAVENOUS
  Administered 2014-01-23: 2 mg via INTRAVENOUS
  Administered 2014-01-23: 4 mg via INTRAVENOUS
  Administered 2014-01-23: 1 mg via INTRAVENOUS
  Administered 2014-01-23 – 2014-01-24 (×2): 2 mg via INTRAVENOUS
  Administered 2014-01-24: 1 mg via INTRAVENOUS

## 2014-01-23 MED ORDER — POTASSIUM CHLORIDE 2 MEQ/ML IV SOLN
80.0000 meq | INTRAVENOUS | Status: DC
  Filled 2014-01-23: qty 40

## 2014-01-23 MED ORDER — LEVOFLOXACIN IN D5W 750 MG/150ML IV SOLN
750.0000 mg | Freq: Once | INTRAVENOUS | Status: AC
  Administered 2014-01-23: 750 mg via INTRAVENOUS
  Filled 2014-01-23: qty 150

## 2014-01-23 MED ORDER — GELATIN ABSORBABLE MT POWD
OROMUCOSAL | Status: DC | PRN
  Administered 2014-01-23 (×3): via TOPICAL

## 2014-01-23 MED ORDER — LACTATED RINGERS IV SOLN
INTRAVENOUS | Status: DC | PRN
  Administered 2014-01-23 – 2014-01-24 (×2): via INTRAVENOUS

## 2014-01-23 MED ORDER — HEMOSTATIC AGENTS (NO CHARGE) OPTIME
TOPICAL | Status: DC | PRN
  Administered 2014-01-23 (×2): 1 via TOPICAL

## 2014-01-23 MED ORDER — HEPARIN SODIUM (PORCINE) 1000 UNIT/ML IJ SOLN
INTRAMUSCULAR | Status: DC | PRN
  Administered 2014-01-23: 10000 [IU] via INTRAVENOUS
  Administered 2014-01-23: 5000 [IU] via INTRAVENOUS
  Administered 2014-01-23: 19000 [IU] via INTRAVENOUS

## 2014-01-23 MED ORDER — SUCCINYLCHOLINE CHLORIDE 20 MG/ML IJ SOLN
INTRAMUSCULAR | Status: DC | PRN
  Administered 2014-01-23: 100 mg via INTRAVENOUS

## 2014-01-23 MED ORDER — SODIUM CHLORIDE 0.9 % IJ SOLN
INTRAMUSCULAR | Status: AC
  Filled 2014-01-23: qty 10

## 2014-01-23 MED ORDER — PROTAMINE SULFATE 10 MG/ML IV SOLN
INTRAVENOUS | Status: AC
  Filled 2014-01-23: qty 25

## 2014-01-23 MED ORDER — METHYLPREDNISOLONE SODIUM SUCC 125 MG IJ SOLR
INTRAMUSCULAR | Status: AC
  Filled 2014-01-23: qty 4

## 2014-01-23 MED ORDER — ROCURONIUM BROMIDE 50 MG/5ML IV SOLN
INTRAVENOUS | Status: AC
  Filled 2014-01-23: qty 1

## 2014-01-23 MED ORDER — DEXTROSE 5 % IV SOLN
1.5000 g | INTRAVENOUS | Status: AC
  Administered 2014-01-23: 1.5 g via INTRAVENOUS
  Administered 2014-01-24: .75 g via INTRAVENOUS
  Filled 2014-01-23: qty 1.5

## 2014-01-23 MED ORDER — DOPAMINE-DEXTROSE 3.2-5 MG/ML-% IV SOLN
2.0000 ug/kg/min | INTRAVENOUS | Status: DC
  Filled 2014-01-23: qty 250

## 2014-01-23 MED ORDER — ASPIRIN 325 MG PO TABS
325.0000 mg | ORAL_TABLET | Freq: Once | ORAL | Status: AC
  Administered 2014-01-23: 325 mg via ORAL
  Filled 2014-01-23: qty 1

## 2014-01-23 MED ORDER — ARTIFICIAL TEARS OP OINT
TOPICAL_OINTMENT | OPHTHALMIC | Status: AC
  Filled 2014-01-23: qty 3.5

## 2014-01-23 MED ORDER — PHENYLEPHRINE HCL 10 MG/ML IJ SOLN
10.0000 mg | INTRAVENOUS | Status: DC | PRN
  Administered 2014-01-23: 25 ug/min via INTRAVENOUS

## 2014-01-23 MED ORDER — ACETAMINOPHEN 325 MG PO TABS
650.0000 mg | ORAL_TABLET | Freq: Once | ORAL | Status: AC
  Administered 2014-01-23: 650 mg via ORAL
  Filled 2014-01-23: qty 2

## 2014-01-23 MED ORDER — FENTANYL CITRATE 0.05 MG/ML IJ SOLN
INTRAMUSCULAR | Status: AC
  Filled 2014-01-23: qty 5

## 2014-01-23 MED ORDER — METHYLPREDNISOLONE SODIUM SUCC 125 MG IJ SOLR
INTRAMUSCULAR | Status: DC | PRN
  Administered 2014-01-23: 125 mg via INTRAVENOUS

## 2014-01-23 MED ORDER — SODIUM CHLORIDE 0.9 % IV SOLN
INTRAVENOUS | Status: AC
  Administered 2014-01-23: 69.8 mL/h via INTRAVENOUS
  Filled 2014-01-23: qty 40

## 2014-01-23 MED ORDER — SODIUM CHLORIDE 0.9 % IV SOLN
INTRAVENOUS | Status: DC
  Filled 2014-01-23: qty 30

## 2014-01-23 MED ORDER — PROPOFOL 10 MG/ML IV BOLUS
INTRAVENOUS | Status: AC
  Filled 2014-01-23: qty 20

## 2014-01-23 MED ORDER — IOHEXOL 350 MG/ML SOLN
100.0000 mL | Freq: Once | INTRAVENOUS | Status: AC | PRN
  Administered 2014-01-23: 100 mL via INTRAVENOUS

## 2014-01-23 MED ORDER — LACTATED RINGERS IV SOLN
INTRAVENOUS | Status: DC | PRN
  Administered 2014-01-23 – 2014-01-24 (×2): via INTRAVENOUS

## 2014-01-23 MED ORDER — FENTANYL CITRATE 0.05 MG/ML IJ SOLN
INTRAMUSCULAR | Status: DC | PRN
  Administered 2014-01-23: 150 ug via INTRAVENOUS
  Administered 2014-01-23: 50 ug via INTRAVENOUS
  Administered 2014-01-23 (×2): 100 ug via INTRAVENOUS
  Administered 2014-01-23: 150 ug via INTRAVENOUS
  Administered 2014-01-23: 100 ug via INTRAVENOUS
  Administered 2014-01-23: 50 ug via INTRAVENOUS
  Administered 2014-01-23: 100 ug via INTRAVENOUS
  Administered 2014-01-23: 150 ug via INTRAVENOUS
  Administered 2014-01-23: 100 ug via INTRAVENOUS

## 2014-01-23 MED ORDER — ARTIFICIAL TEARS OP OINT
TOPICAL_OINTMENT | OPHTHALMIC | Status: DC | PRN
  Administered 2014-01-23: 1 via OPHTHALMIC

## 2014-01-23 MED ORDER — ETOMIDATE 2 MG/ML IV SOLN
INTRAVENOUS | Status: DC | PRN
  Administered 2014-01-23 (×2): 10 mg via INTRAVENOUS

## 2014-01-23 MED ORDER — LACTATED RINGERS IV SOLN
INTRAVENOUS | Status: DC | PRN
  Administered 2014-01-23: 20:00:00 via INTRAVENOUS

## 2014-01-23 MED ORDER — SODIUM CHLORIDE 0.9 % IR SOLN
Status: DC | PRN
  Administered 2014-01-23: 21:00:00

## 2014-01-23 MED ORDER — FENTANYL CITRATE 0.05 MG/ML IJ SOLN
INTRAMUSCULAR | Status: AC
  Filled 2014-01-23: qty 10

## 2014-01-23 MED ORDER — INSULIN REGULAR HUMAN 100 UNIT/ML IJ SOLN
INTRAMUSCULAR | Status: AC
  Administered 2014-01-23: 2.2 [IU]/h via INTRAVENOUS
  Filled 2014-01-23: qty 1

## 2014-01-23 MED ORDER — MILRINONE IN DEXTROSE 20 MG/100ML IV SOLN
0.1250 ug/kg/min | INTRAVENOUS | Status: DC
  Filled 2014-01-23: qty 100

## 2014-01-23 MED ORDER — VANCOMYCIN HCL 1000 MG IV SOLR
INTRAVENOUS | Status: AC
  Administered 2014-01-23: 20:00:00
  Filled 2014-01-23: qty 1000

## 2014-01-23 SURGICAL SUPPLY — 120 items
ADAPTER CARDIO PERF ANTE/RETRO (ADAPTER) ×4 IMPLANT
APPLICATOR TIP COSEAL (VASCULAR PRODUCTS) ×2 IMPLANT
BAG DECANTER FOR FLEXI CONT (MISCELLANEOUS) ×6 IMPLANT
BLADE STERNUM SYSTEM 6 (BLADE) ×2 IMPLANT
BLADE SURG 11 STRL SS (BLADE) ×2 IMPLANT
BLADE SURG 15 STRL LF DISP TIS (BLADE) IMPLANT
BLADE SURG 15 STRL SS (BLADE)
BLADE SURG ROTATE 9660 (MISCELLANEOUS) IMPLANT
CANISTER SUCTION 2500CC (MISCELLANEOUS) ×2 IMPLANT
CANN PRFSN .5XCNCT 15X34-48 (MISCELLANEOUS)
CANNULA ARTERIAL 007325 (MISCELLANEOUS) IMPLANT
CANNULA ARTERIAL 14F 007324 (MISCELLANEOUS) IMPLANT
CANNULA ARTERIAL 18F 007308 (MISCELLANEOUS) IMPLANT
CANNULA ARTERIAL 20F L7309 (MISCELLANEOUS) IMPLANT
CANNULA ARTERIAL 22F 007310 (MISCELLANEOUS) IMPLANT
CANNULA ARTERIAL 24F 007311 (MISCELLANEOUS) IMPLANT
CANNULA FEM VENOUS REMOTE 22FR (CANNULA) ×2 IMPLANT
CANNULA GRAFT 8MMX50CM (Graft) ×2 IMPLANT
CANNULA GUNDRY RCSP 15FR (MISCELLANEOUS) ×2 IMPLANT
CANNULA PRFSN .5XCNCT 15X34-48 (MISCELLANEOUS) IMPLANT
CANNULA VEN 2 STAGE (MISCELLANEOUS)
CATH HEART VENT LEFT (CATHETERS) ×1 IMPLANT
CATH THORACIC 28FR (CATHETERS) IMPLANT
CATH THORACIC 36FR (CATHETERS) ×2 IMPLANT
CATH THORACIC 36FR RT ANG (CATHETERS) IMPLANT
CLIP TI MEDIUM 6 (CLIP) IMPLANT
CLIP TI WIDE RED SMALL 24 (CLIP) IMPLANT
CLIP TI WIDE RED SMALL 6 (CLIP) IMPLANT
CONN 1/2X1/2X1/2  BEN (MISCELLANEOUS)
CONN 1/2X1/2X1/2 BEN (MISCELLANEOUS) IMPLANT
CONN 3/8X3/8 GISH STERILE (MISCELLANEOUS) IMPLANT
CONN ST 1/4X3/8  BEN (MISCELLANEOUS) ×2
CONN ST 1/4X3/8 BEN (MISCELLANEOUS) ×2 IMPLANT
CONN Y 3/8X3/8X3/8  BEN (MISCELLANEOUS)
CONN Y 3/8X3/8X3/8 BEN (MISCELLANEOUS) IMPLANT
CONT SPEC 4OZ CLIKSEAL STRL BL (MISCELLANEOUS) ×6 IMPLANT
CRADLE DONUT ADULT HEAD (MISCELLANEOUS) IMPLANT
DERMABOND ADHESIVE PROPEN (GAUZE/BANDAGES/DRESSINGS) ×1
DERMABOND ADVANCED .7 DNX6 (GAUZE/BANDAGES/DRESSINGS) ×1 IMPLANT
DRAIN CHANNEL 32F RND 10.7 FF (WOUND CARE) ×2 IMPLANT
DRAPE INCISE IOBAN 66X45 STRL (DRAPES) ×2 IMPLANT
DRSG COVADERM 4X14 (GAUZE/BANDAGES/DRESSINGS) ×2 IMPLANT
ELECT REM PT RETURN 9FT ADLT (ELECTROSURGICAL) ×4
ELECTRODE REM PT RTRN 9FT ADLT (ELECTROSURGICAL) ×2 IMPLANT
FELT TEFLON 6X6 (MISCELLANEOUS) ×2 IMPLANT
GLOVE BIO SURGEON STRL SZ 6 (GLOVE) IMPLANT
GLOVE BIO SURGEON STRL SZ 6.5 (GLOVE) ×2 IMPLANT
GLOVE BIO SURGEON STRL SZ7 (GLOVE) IMPLANT
GLOVE BIO SURGEON STRL SZ7.5 (GLOVE) ×2 IMPLANT
GLOVE BIOGEL PI IND STRL 6 (GLOVE) ×2 IMPLANT
GLOVE BIOGEL PI IND STRL 7.0 (GLOVE) ×6 IMPLANT
GLOVE BIOGEL PI INDICATOR 6 (GLOVE) ×2
GLOVE BIOGEL PI INDICATOR 7.0 (GLOVE) ×6
GOWN STRL REUS W/ TWL LRG LVL3 (GOWN DISPOSABLE) ×4 IMPLANT
GOWN STRL REUS W/TWL LRG LVL3 (GOWN DISPOSABLE) ×4
GRAFT WOVEN D/V 32DX30L (Vascular Products) ×2 IMPLANT
HANDLE STAPLE ENDO GIA SHORT (STAPLE) ×1
HANDPIECE INTERPULSE COAX TIP (DISPOSABLE) ×1
HEMOSTAT POWDER SURGIFOAM 1G (HEMOSTASIS) ×14 IMPLANT
INSERT FOGARTY SM (MISCELLANEOUS) IMPLANT
INSERT FOGARTY XLG (MISCELLANEOUS) ×2 IMPLANT
KIT BASIN OR (CUSTOM PROCEDURE TRAY) ×2 IMPLANT
KIT DILATOR VASC 18G NDL (KITS) ×2 IMPLANT
KIT ROOM TURNOVER OR (KITS) ×2 IMPLANT
KIT SUCTION CATH 14FR (SUCTIONS) ×2 IMPLANT
LEAD PACING MYOCARDI (MISCELLANEOUS) ×2 IMPLANT
LOOP VESSEL SUPERMAXI WHITE (MISCELLANEOUS) ×2 IMPLANT
NEEDLE AORTIC AIR ASPIRATING (NEEDLE) IMPLANT
NS IRRIG 1000ML POUR BTL (IV SOLUTION) ×10 IMPLANT
PACK OPEN HEART (CUSTOM PROCEDURE TRAY) ×2 IMPLANT
PAD ARMBOARD 7.5X6 YLW CONV (MISCELLANEOUS) ×4 IMPLANT
PUNCH AORTIC ROTATE 5MM 8IN (MISCELLANEOUS) ×2 IMPLANT
RELOAD ENDO GIA 30 2.5 (STAPLE) ×2 IMPLANT
SEALANT SURG COSEAL 8ML (VASCULAR PRODUCTS) ×4 IMPLANT
SET HNDPC FAN SPRY TIP SCT (DISPOSABLE) ×1 IMPLANT
SPONGE GAUZE 4X4 12PLY (GAUZE/BANDAGES/DRESSINGS) ×2 IMPLANT
SPONGE LAP 18X18 X RAY DECT (DISPOSABLE) ×6 IMPLANT
SPONGE LAP 4X18 X RAY DECT (DISPOSABLE) ×4 IMPLANT
STAPLER ENDO GIA 12MM SHORT (STAPLE) ×1 IMPLANT
STOPCOCK 4 WAY LG BORE MALE ST (IV SETS) IMPLANT
STRIP PERIGUARD 6X8 (Vascular Products) ×2 IMPLANT
SUCKER INTRACARDIAC WEIGHTED (SUCKER) ×2 IMPLANT
SUT ETHIBOND NAB MH 2-0 36IN (SUTURE) ×22 IMPLANT
SUT MNCRL AB 3-0 PS2 18 (SUTURE) ×6 IMPLANT
SUT PDS AB 1 CTX 36 (SUTURE) ×4 IMPLANT
SUT PROLENE 3 0 RB 1 (SUTURE) IMPLANT
SUT PROLENE 3 0 SH DA (SUTURE) ×10 IMPLANT
SUT PROLENE 3 0 SH1 36 (SUTURE) ×38 IMPLANT
SUT PROLENE 4 0 RB 1 (SUTURE) ×1
SUT PROLENE 4 0 SH DA (SUTURE) IMPLANT
SUT PROLENE 4-0 RB1 .5 CRCL 36 (SUTURE) ×1 IMPLANT
SUT PROLENE 5 0 C 1 24 (SUTURE) ×4 IMPLANT
SUT PROLENE 5 0 C 1 36 (SUTURE) ×8 IMPLANT
SUT SILK  1 MH (SUTURE) ×1
SUT SILK 1 MH (SUTURE) ×1 IMPLANT
SUT SILK 2 0 SH CR/8 (SUTURE) ×2 IMPLANT
SUT SILK 3 0 TIES 10X30 (SUTURE) ×2 IMPLANT
SUT STEEL STERNAL CCS#1 18IN (SUTURE) ×2 IMPLANT
SUT STEEL SZ 6 DBL 3X14 BALL (SUTURE) ×4 IMPLANT
SUT VIC AB 1 CT1 18XCR BRD 8 (SUTURE) IMPLANT
SUT VIC AB 1 CT1 8-18 (SUTURE)
SUT VIC AB 1 CTX 27 (SUTURE) IMPLANT
SUT VIC AB 2-0 CT1 27 (SUTURE)
SUT VIC AB 2-0 CT1 TAPERPNT 27 (SUTURE) IMPLANT
SUT VIC AB 2-0 CTX 36 (SUTURE) ×6 IMPLANT
SUT VIC AB 3-0 SH 27 (SUTURE)
SUT VIC AB 3-0 SH 27X BRD (SUTURE) IMPLANT
SUT VIC AB 3-0 SH 8-18 (SUTURE) ×4 IMPLANT
SUT VIC AB 3-0 X1 27 (SUTURE) IMPLANT
SUT VICRYL 4-0 PS2 18IN ABS (SUTURE) IMPLANT
SWAB COLLECTION DEVICE MRSA (MISCELLANEOUS) ×2 IMPLANT
SYR 10ML KIT SKIN ADHESIVE (MISCELLANEOUS) ×2 IMPLANT
SYR BULB IRRIGATION 50ML (SYRINGE) ×2 IMPLANT
SYSTEM SAHARA CHEST DRAIN ATS (WOUND CARE) ×2 IMPLANT
TAPE CLOTH SURG 4X10 WHT LF (GAUZE/BANDAGES/DRESSINGS) ×2 IMPLANT
TOWEL OR 17X24 6PK STRL BLUE (TOWEL DISPOSABLE) ×2 IMPLANT
TOWEL OR 17X26 10 PK STRL BLUE (TOWEL DISPOSABLE) ×2 IMPLANT
TRAY CATH LUMEN 1 20CM STRL (SET/KITS/TRAYS/PACK) IMPLANT
VENT LEFT HEART 12002 (CATHETERS) ×2
WATER STERILE IRR 1000ML POUR (IV SOLUTION) ×4 IMPLANT

## 2014-01-23 NOTE — Anesthesia Procedure Notes (Addendum)
Procedure Name: Intubation Date/Time: 01/23/2014 7:43 PM Performed by: Vaughan Browner Pre-anesthesia Checklist: Patient identified, Emergency Drugs available, Suction available, Patient being monitored and Timeout performed Patient Re-evaluated:Patient Re-evaluated prior to inductionOxygen Delivery Method: Circle system utilized Preoxygenation: Pre-oxygenation with 100% oxygen Intubation Type: IV induction, Rapid sequence and Cricoid Pressure applied Laryngoscope Size: Mac and 3 Grade View: Grade I Tube type: Oral Tube size: 8.0 mm Number of attempts: 1 Airway Equipment and Method: Stylet Placement Confirmation: ETT inserted through vocal cords under direct vision,  positive ETCO2 and breath sounds checked- equal and bilateral Secured at: 22 cm Tube secured with: Tape Dental Injury: Teeth and Oropharynx as per pre-operative assessment

## 2014-01-23 NOTE — ED Notes (Signed)
Patient is aware that a urine sample is needed, urinal at bedside.

## 2014-01-23 NOTE — H&P (Addendum)
[address]       [place] [address]             [phone]          CARDIOTHORACIC SURGERY HISTORY AND PHYSICAL EXAM  PCP is KNAPP,EVE A, MD Referring Provider is Wandra Arthurs, MD   Chief Complaint:  weakness  HPI:  Patient is a 80 year old male with history of hypertension, GE reflux disease, benign prostatic hypertrophy, and degenerative disc disease of the cervical and lumbar spine with otherwise been very healthy for most of his life and physically very active. Beginning in January of this year the patient began to experience progressive generalized weakness and fatigue. He reported occasional double substernal chest pain, but this was relatively mild in severity. He was treated for depression. 6 weeks ago while traveling in [place] he was evaluated in the emergency room for severe weakness. No specific diagnosis was discovered.  Over the past 2 weeks the patient has developed progressive exertional shortness of breath. He now get short of breath with minimal exertion. He denies resting shortness of breath. He denies ongoing chest pain. He has not had fevers or chills. He has not had any dizzy spells or syncope. He has not had any hoarseness of his voice. He denies any productive cough, hemoptysis, or wheezing. He has had poor appetite and he thinks he has lost 10 pounds of weight. Symptoms have become acutely worse over the last few days, prompting his family to urgently go to the emergency room.  He was initially evaluated in the emergency department at [facility]. The area was notably tachycardic and mildly hypotensive. He had low-grade fever.  Blood work was notable for leukocytosis and mild lactic acidosis.  Chest x-ray demonstrated widening mediastinum with prominence of the left hilum.  A CT angiogram of the chest was performed to rule out pulmonary embolus. 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 There is blood in the anterior mediastinum. The aneurysm compresses the pulmonary artery.  There is a small pulmonary embolus.  The patient was sent directly to [facility] emergency department for cardiothoracic surgical consultation.  The patient reports feeling generalized weak. He denies any ongoing chest pain. He reports severe exertional shortness of breath. He denies resting shortness breath. He denies fevers or chills. He denies any productive cough, hemoptysis, wheezing. He has not had any headaches, dizzy spells, or syncope.  He lives at home with his wife. He has remained physically active all of his life and up until January he exercised on a daily basis.  Past Medical History  Diagnosis Date  . Hypertension   . Unspecified hypothyroidism   . Mitral valve prolapse     h/o; normal echo 12/2011 with no MVP seen  . Foot drop, right   . BPH (benign prostatic hypertrophy)   . Iron deficiency anemia, unspecified 6/09  . Hearing loss in left ear   . Diverticulosis of colon 12/09  . Internal hemorrhoids   . GERD (gastroesophageal reflux disease)   . Allergic rhinitis, cause unspecified     on allergy shots (Dr. Orvil Feil)  . Allergic conjunctivitis     Past Surgical History  Procedure Laterality Date  . Prostate surgery  2007    photovaporization  . Cervical laminectomy  1972    C5-6  . Spine surgery  2006    L4-5 disk surgery  .  Rotator cuff repair  10/2008    left; Dr. Onnie Graham  . Esophagogastroduodenoscopy  10/31/08    normal; Dr. Edison Nasuti  . Tonsillectomy    . Hemorroidal banding  04/07/2013    x3-Dr.Eric Redmond Pulling  . Hemorrhoidectomy with hemorrhoid banding  04/07/13  . Spine surgery  04/2013    L4-5, L5-S1 fusion.  Dr. Christella Noa    Family History  Problem Relation Age of Onset  . Diabetes Mother   . Heart disease Mother     CHF  . Stroke Father   . Parkinsonism Father   . Cancer Brother     bladder cancer  . Cancer Brother     prostate  cancer  . Emphysema Brother   . Diabetes Sister   . Colon cancer Neg Hx     Social History History  Substance Use Topics  . Smoking status: Former Smoker    Types: Cigarettes    Quit date: 09/01/1977  . Smokeless tobacco: Never Used  . Alcohol Use: Yes     Comment: 1-2 glasses wine per day    Prior to Admission medications   Medication Sig Start Date End Date Taking? Authorizing Provider  amLODipine (NORVASC) 2.5 MG tablet Take 2.5 mg by mouth daily.   Yes Historical Provider, MD  aspirin 81 MG tablet Take 81 mg by mouth at bedtime.    Yes Historical Provider, MD  citalopram (CELEXA) 10 MG tablet Take 10 mg by mouth daily.   Yes Historical Provider, MD  Ferrous Sulfate (IRON) 325 (65 FE) MG TABS Take 1 tablet by mouth every other day.   Yes Historical Provider, MD  levothyroxine (SYNTHROID, LEVOTHROID) 50 MCG tablet Take 50 mcg by mouth daily before breakfast.   Yes Historical Provider, MD  lisinopril (PRINIVIL,ZESTRIL) 30 MG tablet Take 30 mg by mouth daily.   Yes Historical Provider, MD  omeprazole (PRILOSEC) 20 MG capsule Take 20 mg by mouth every other day.   Yes Historical Provider, MD    No Known Allergies  Review of Systems:  General:  decreased appetite, decreased energy   Respiratory:  no cough, no wheezing, no hemoptysis, no pain with inspiration or cough, + shortness of breath   Cardiac:   + mild dull chest pain or tightness, + severe exertional SOB, no resting SOB, no PND, no orthopnea, no LE edema, no palpitations, no syncope  GI:   no difficulty swallowing, no hematochezia, no hematemesis, no melena, no constipation, no diarrhea   GU:   no dysuria, no urgency, no frequency   Musculoskeletal: + arthritis in back, no arthralgia   Vascular:  no pain suggestive of claudication   Neuro:   no symptoms suggestive of TIA's, no seizures, no headaches, no peripheral neuropathy   Endocrine:  Negative   HEENT:  no loose teeth or painful teeth,  no recent vision  changes  Psych:   no anxiety, ? depression - he doesn't think so   Physical Exam:   BP 113/74  Pulse 109  Temp(Src) 97.4 F (36.3 C) (Oral)  Resp 25  Wt 59.4 kg (130 lb 15.3 oz)  SpO2 98%  General:  Thin but weak-appearing  HEENT:  Unremarkable   Neck:   no JVD, no bruits, no adenopathy   Chest:   clear to auscultation, symmetrical breath sounds, no wheezes, no rhonchi   CV:   RRR, II/VI systolic murmur   Abdomen:  soft, non-tender, no masses   Extremities:  warm, well-perfused, pulses palpable  Rectal/GU  Deferred  Neuro:   Grossly non-focal and symmetrical throughout  Skin:   Clean and dry, no rashes, no breakdown  Diagnostic Tests:  CT ANGIOGRAPHY CHEST WITH CONTRAST  TECHNIQUE:  Multidetector CT imaging of the chest was performed using the  standard protocol during bolus administration of intravenous  contrast. Multiplanar CT image reconstructions and MIPs were  obtained to evaluate the vascular anatomy.  CONTRAST: 156mL OMNIPAQUE IOHEXOL 350 MG/ML SOLN  COMPARISON: None.  FINDINGS:  There is probable blood in the anterior mediastinum. There is a  saccular pseudoaneurysm arising from the medial aspect of the distal  ascending aorta, protruding to the left directed towards the  pulmonary artery. This results in aortic lumen transverse diameter  of approximately 6.4 cm. The lesion extends over craniocaudal length  of approximately 6.4 cm. The lesion terminates at approximately the  level of the right brachiocephalic artery origin. There is some  lamellated clot around the periphery of the lesion.  Mass effect from the pseudoaneurysm narrows the main pulmonary  artery at its bifurcation. There is a pulmonary embolus which is  lodged in the main pulmonary artery at the level of the narrowing,  partially extending into the proximal left pulmonary artery. No  additional peripheral pulmonary emboli are evident. Elevated RV/LV  ratio of 1.33.  There is atheromatous plaque  in the aortic arch. Classic 3 vessel  brachiocephalic arterial origin anatomy without proximal stenosis.  There is scattered plaque in the descending thoracic aorta. No  dissection. The distal arch and descending thoracic aorta are normal  in caliber.  There is a trace left pleural effusion. No pericardial effusion. A  few calcified AP window lymph nodes. No hilar adenopathy. Coarse  subpleural scarring in the periphery of the lungs most marked in the  lung bases. No confluent airspace infiltrate or nodule.  Innumerable small calcifications in the visualized portions of liver  and spleen suggesting old granulomatous disease. Remainder  visualized upper abdomen unremarkable. Degenerative spurs in the  lower cervical, mid and lower thoracic spine. Sternum is intact.  Advanced degenerative changes in the right shoulder.  Review of the MIP images confirms the above findings.  IMPRESSION:  1. 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  There is clot surrounding the lesion with some probable blood in the  anterior mediastinum.  2. Narrowing of the main pulmonary artery secondary to mass effect  from the ascending aortic pseudoaneurysm.  3. Pulmonary embolus in the distal main pulmonary artery at the  level of the stenosis.  Critical Value/emergent results were called by telephone at the time  of interpretation on 01/23/2014 at 5:53 PM to Dr. Shirlyn Goltz , who  verbally acknowledged these results.  4. Evidence of right ventricular strain, more likely secondary to  pulmonary artery stenosis than clot burden.  Electronically Signed  By: Arne Cleveland M.D.  On: 01/23/2014 17:55   CT HEAD WITHOUT CONTRAST  TECHNIQUE:  Contiguous axial images were obtained from the base of the skull  through the vertex without intravenous contrast.   COMPARISON: CT head 12/26/2011  FINDINGS:  Age-appropriate atrophy. Negative for hydrocephalus. Chronic  microvascular ischemic changes in the cerebral white matter. Chronic  infarct in the right anterior putamen is unchanged.  Negative for acute infarct, hemorrhage, or mass lesion.  IMPRESSION:  Chronic ischemic change. No acute abnormality.  Electronically Signed  By: Franchot Gallo  M.D.  On: 01/23/2014 16:34   Impression:  Patient appears to have contained rupture of the large aneurysm involving the distal ascending thoracic aorta and transverse aortic arch. There is compression of the pulmonary artery with a small pulmonsry embolus within the main pulmonary artery.    Plan:  I discussed a very serious nature of this problem with the patient and his family at the bedside in the emergency department. I explained that although the time course of this event is impossible to know for certain, the contained rupture is slightly unstable and would best be treated with emergent surgical intervention. I explained the high risks associated with surgery.  The patient and his family understand and accept all potential associated risks of surgery including but not limited to risk of death, stroke, myocardial infarction, congestive heart failure, respiratory failure, renal failure, bleeding requiring blood transfusion and/or reexploration, arrhythmia, heart block or bradycardia requiring permanent pacemaker, pneumonia, pleural effusion, wound infection, pulmonary embolus or other thromboembolic complication, chronic pain or other delayed complications.  All questions answered.  We plan to proceed directly to the operating room.     Valentina Gu. Roxy Manns, MD

## 2014-01-23 NOTE — ED Notes (Signed)
Family at bedside. 

## 2014-01-23 NOTE — ED Notes (Signed)
Pt has been treated for depression for the past 2 weeks, pt is now getting fatigue and health has been declining per family. Denies pain. Pt is shivering in triage and states he is normally not this cold. Pt normally able to walk w/o assistance but now needs assistance.

## 2014-01-23 NOTE — Progress Notes (Signed)
Chaplain responded to request from ED nurse to check in on family of pt in Trauma C who will soon be going to surgery. Accompanied pt, wife, and daughter from ED to OR, offering emotional support to pt enroute. Escorted pt's wife and daughter to OR waiting area, offering emotional support and empathic conversation until other family members arrived. Pt's daughter expressed appreciation for chaplain support.

## 2014-01-23 NOTE — ED Provider Notes (Signed)
CSN: [id number]     Arrival date & time 01/23/14  1449 History   First MD Initiated Contact with Patient 01/23/14 1530     Chief Complaint  Patient presents with  . Fatigue     (Consider location/radiation/quality/duration/timing/severity/associated sxs/prior Treatment) The history is provided by the patient.  Gregg Winchell is a 80 y.o. male hx of HTN, BPH, here with weakness, fatigue. He was recently treated for depression and is currently on citalopram. Over the last week he's been having some subjective chills. He is also progressively weaker. He usually walks without assistance but now needs to walk with assistance. Also as per daughter he's been more short of breath on exertion. He was seen with similar symptoms in [place] and had a normal stress test. Denies abdominal pain or urinary symptoms. His daughter called PMD and was sent in for evaluation. His recent labs showed a mild hyponatremia as well as hypokalemia. Sent in for rule out infection versus TIA versus metabolic abnormality.    Past Medical History  Diagnosis Date  . Hypertension   . Unspecified hypothyroidism   . Mitral valve prolapse     h/o; normal echo 12/2011 with no MVP seen  . Foot drop, right   . BPH (benign prostatic hypertrophy)   . Iron deficiency anemia, unspecified 6/09  . Hearing loss in left ear   . Diverticulosis of colon 12/09  . Internal hemorrhoids   . GERD (gastroesophageal reflux disease)   . Allergic rhinitis, cause unspecified     on allergy shots (Dr. Orvil Feil)  . Allergic conjunctivitis    Past Surgical History  Procedure Laterality Date  . Prostate surgery  2007    photovaporization  . Cervical laminectomy  1972    C5-6  . Spine surgery  2006    L4-5 disk surgery  . Rotator cuff repair  10/2008    left; Dr. Onnie Graham  . Esophagogastroduodenoscopy  10/31/08    normal; Dr. Edison Nasuti  . Tonsillectomy    . Hemorroidal banding  04/07/2013    x3-Dr.Eric Redmond Pulling  . Hemorrhoidectomy with hemorrhoid  banding  04/07/13  . Spine surgery  04/2013    L4-5, L5-S1 fusion.  Dr. Christella Noa   Family History  Problem Relation Age of Onset  . Diabetes Mother   . Heart disease Mother     CHF  . Stroke Father   . Parkinsonism Father   . Cancer Brother     bladder cancer  . Cancer Brother     prostate cancer  . Emphysema Brother   . Diabetes Sister   . Colon cancer Neg Hx    History  Substance Use Topics  . Smoking status: Former Smoker    Types: Cigarettes    Quit date: 09/01/1977  . Smokeless tobacco: Never Used  . Alcohol Use: Yes     Comment: 1-2 glasses wine per day    Review of Systems  Respiratory: Positive for shortness of breath.   Neurological: Positive for weakness.  All other systems reviewed and are negative.     Allergies  Review of patient's allergies indicates no known allergies.  Home Medications   Prior to Admission medications   Medication Sig Start Date End Date Taking? Authorizing Provider  ALPRAZolam (XANAX) 0.25 MG tablet Take 0.25 mg by mouth 3 (three) times daily as needed for anxiety.   Yes Historical Provider, MD  amLODipine (NORVASC) 2.5 MG tablet Take 2.5 mg by mouth daily.   Yes Historical Provider, MD  citalopram (CELEXA)  10 MG tablet Take 10 mg by mouth daily.   Yes Historical Provider, MD  Ferrous Sulfate (IRON) 325 (65 FE) MG TABS Take 1 tablet by mouth every other day.   Yes Historical Provider, MD  levothyroxine (SYNTHROID, LEVOTHROID) 50 MCG tablet Take 50 mcg by mouth daily before breakfast.   Yes Historical Provider, MD  lisinopril (PRINIVIL,ZESTRIL) 30 MG tablet Take 30 mg by mouth daily.   Yes Historical Provider, MD  omeprazole (PRILOSEC) 20 MG capsule Take 20 mg by mouth every other day.   Yes Historical Provider, MD  aspirin 81 MG tablet Take 81 mg by mouth daily.      Historical Provider, MD  cetirizine (ZYRTEC) 10 MG tablet Take 10 mg by mouth daily.    Historical Provider, MD  fluticasone Asencion Islam) 50 MCG/ACT nasal spray  12/02/13    Historical Provider, MD  psyllium (REGULOID) 0.52 G capsule Take 0.52 g by mouth daily.    Historical Provider, MD  triamcinolone cream (KENALOG) 0.1 % Apply 1 application topically 2 (two) times daily. 08/15/13   Rita Ohara, MD   BP 109/65  Pulse 102  Temp(Src) 100.2 F (37.9 C) (Rectal)  Resp 25  SpO2 99% Physical Exam  Nursing note and vitals reviewed. Constitutional: He is oriented to person, place, and time.  Chronically ill, cold. Dehydrated   HENT:  Head: Normocephalic.  MM dry   Eyes: Conjunctivae are normal. Pupils are equal, round, and reactive to light.  Neck: Normal range of motion. Neck supple.  Cardiovascular: Regular rhythm and normal heart sounds.   Tachycardic   Pulmonary/Chest: Effort normal.  Diminished R base   Abdominal: Soft. Bowel sounds are normal. He exhibits no distension. There is no tenderness. There is no rebound and no guarding.  Musculoskeletal: Normal range of motion. He exhibits no edema and no tenderness.  Neurological: He is alert and oriented to person, place, and time. No cranial nerve deficit. Coordination normal.  4/5 strength throughout   Skin: Skin is warm and dry.  Psychiatric: He has a normal mood and affect. His behavior is normal. Judgment and thought content normal.    ED Course  Procedures (including critical care time)  CRITICAL CARE Performed by: Wandra Arthurs   Total critical care time: 30 min   Critical care time was exclusive of separately billable procedures and treating other patients.  Critical care was necessary to treat or prevent imminent or life-threatening deterioration.  Critical care was time spent personally by me on the following activities: development of treatment plan with patient and/or surrogate as well as nursing, discussions with consultants, evaluation of patient's response to treatment, examination of patient, obtaining history from patient or surrogate, ordering and performing treatments and  interventions, ordering and review of laboratory studies, ordering and review of radiographic studies, pulse oximetry and re-evaluation of patient's condition.   Labs Review Labs Reviewed  CBC WITH DIFFERENTIAL - Abnormal; Notable for the following:    WBC 26.1 (*)    Hemoglobin 12.4 (*)    HCT 36.2 (*)    MCV 77.8 (*)    Platelets 587 (*)    Neutrophils Relative % 87 (*)    Lymphocytes Relative 7 (*)    Neutro Abs 22.7 (*)    Monocytes Absolute 1.6 (*)    All other components within normal limits  COMPREHENSIVE METABOLIC PANEL - Abnormal; Notable for the following:    Sodium 131 (*)    Chloride 89 (*)    Glucose, Bld 137 (*)  BUN 39 (*)    Albumin 2.3 (*)    AST 40 (*)    Alkaline Phosphatase 265 (*)    GFR calc non Af Amer 55 (*)    GFR calc Af Amer 64 (*)    All other components within normal limits  URINALYSIS, ROUTINE W REFLEX MICROSCOPIC - Abnormal; Notable for the following:    Color, Urine AMBER (*)    APPearance CLOUDY (*)    Bilirubin Urine SMALL (*)    All other components within normal limits  I-STAT TROPOININ, ED - Abnormal; Notable for the following:    Troponin i, poc 0.11 (*)    All other components within normal limits  I-STAT CG4 LACTIC ACID, ED - Abnormal; Notable for the following:    Lactic Acid, Venous 2.33 (*)    All other components within normal limits  URINE CULTURE  CULTURE, BLOOD (ROUTINE X 2)  CULTURE, BLOOD (ROUTINE X 2)    Imaging Review Dg Chest 2 View  01/23/2014   CLINICAL DATA:  Shortness of breath.  Fatigue.  EXAM: CHEST  2 VIEW  COMPARISON:  04/21/2013  FINDINGS: The patient has developed fullness in the left hilum since the prior exam, worrisome for adenopathy. Overall heart size and pulmonary vascularity are normal. There is slight chronic interstitial disease at the lung bases, left more than right. No effusions. No osseous abnormality of significance.  IMPRESSION: New prominence of the left hilum worrisome for adenopathy. CT scan  of the chest with contrast is recommended for further evaluation.   Electronically Signed   By: Rozetta Nunnery M.D.   On: 01/23/2014 16:03   Ct Head Wo Contrast  01/23/2014   CLINICAL DATA:  Weakness  EXAM: CT HEAD WITHOUT CONTRAST  TECHNIQUE: Contiguous axial images were obtained from the base of the skull through the vertex without intravenous contrast.  COMPARISON:  CT head 12/26/2011  FINDINGS: Age-appropriate atrophy. Negative for hydrocephalus. Chronic microvascular ischemic changes in the cerebral white matter. 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  Negative for acute infarct, hemorrhage, or mass lesion.  IMPRESSION: Chronic ischemic change.  No acute abnormality.   Electronically Signed   By: Franchot Gallo M.D.   On: 01/23/2014 16:34   Ct Angio Chest Pe W/cm &/or Wo Cm  01/23/2014   CLINICAL DATA:  Fatigue , SOB, r/o PE  EXAM: CT ANGIOGRAPHY CHEST WITH CONTRAST  TECHNIQUE: Multidetector CT imaging of the chest was performed using the standard protocol during bolus administration of intravenous contrast. Multiplanar CT image reconstructions and MIPs were obtained to evaluate the vascular anatomy.  CONTRAST:  158mL OMNIPAQUE IOHEXOL 350 MG/ML SOLN  COMPARISON:  None.  FINDINGS: There is probable blood in the anterior mediastinum. 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 This results in aortic lumen transverse diameter of approximately 6.4 cm. The lesion extends over craniocaudal length of approximately 6.4 cm. The lesion terminates at approximately the level of the right brachiocephalic artery origin. There is some lamellated clot around the periphery of the lesion.  Mass effect from the pseudoaneurysm narrows the main pulmonary artery at its bifurcation. There is a pulmonary embolus which is lodged in the main pulmonary artery at the level of the narrowing, partially  extending into the proximal left pulmonary artery. No additional peripheral pulmonary emboli are evident. Elevated RV/LV ratio of 1.33.  There is atheromatous plaque in the aortic  arch. Classic 3 vessel brachiocephalic arterial origin anatomy without proximal stenosis. There is scattered plaque in the descending thoracic aorta. No dissection. The distal arch and descending thoracic aorta are normal in caliber.  There is a trace left pleural effusion. No pericardial effusion. A few calcified AP window lymph nodes. No hilar adenopathy. Coarse subpleural scarring in the periphery of the lungs most marked in the lung bases. No confluent airspace infiltrate or nodule.  Innumerable small calcifications in the visualized portions of liver and spleen suggesting old granulomatous disease. Remainder visualized upper abdomen unremarkable. Degenerative spurs in the lower cervical, mid and lower thoracic spine. Sternum is intact. Advanced degenerative changes in the right shoulder.  Review of the MIP images confirms the above findings.  IMPRESSION: 1. 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 There is clot surrounding the lesion with some probable blood in the anterior mediastinum. 2. Narrowing of the main pulmonary artery secondary to mass effect from the ascending aortic pseudoaneurysm. 3. Pulmonary embolus in the distal main pulmonary artery at the level of the stenosis.  Critical Value/emergent results were called by telephone at the time of interpretation on 01/23/2014 at 5:53 PM to Dr. Shirlyn Goltz , who verbally acknowledged these results.  4. Evidence of right ventricular strain, more likely secondary to pulmonary artery stenosis than clot burden.   Electronically Signed   By: Arne Cleveland M.D.   On: 01/23/2014 17:55     EKG Interpretation   Date/Time:   Monday Jan 23 2014 15:46:12 EDT Ventricular Rate:  119 PR Interval:  156 QRS Duration: 101 QT Interval:  341 QTC Calculation: 480 R Axis:   -102 Text Interpretation:  Sinus tachycardia Ventricular premature complex  Probable right ventricular hypertrophy Inferior infarct, old Baseline  wander in lead(s) V2 Since last tracing rate faster Confirmed by YAO  MD,  DAVID ([id number]) on 01/23/2014 3:48:56 PM      MDM   Final diagnoses:  Community acquired pneumonia  Pulmonary embolism  Aneurysm of aorta    Billyjack Trompeter is a 80 y.o. male here with weakness, SOB. Consider unstable angina vs metabolic changes vs TIA vs infection. Tachycardic, febrile, and hypotensive. Concerned for sepsis. Will do sepsis workup, trop, CT head.   4:30 PM Abnormal mediastinum. Will do CT angio to assess. Will give IV abx for possible pneumonia. WBC 26, lactate elevated. Troponin positive.   6:03 PM CT showed + pseudoaneurysm of aorta with PE. I called Dr. Roxy Manns, who will see patient at [facility] ED. Will hold off on heparin for now. Will transfer to [facility].    Wandra Arthurs, MD 01/23/14 [phone]

## 2014-01-23 NOTE — ED Notes (Signed)
Informed Dr. Darl Householder of lactic acid 4.59.

## 2014-01-23 NOTE — Anesthesia Preprocedure Evaluation (Signed)
Anesthesia Evaluation  Patient identified by MRN, date of birth, ID band Patient awake    Reviewed: Allergy & Precautions, H&P , NPO status , Patient's Chart, lab work & pertinent test results  Airway Mallampati: II TM Distance: >3 FB Neck ROM: Full    Dental  (+) Edentulous Upper   Pulmonary former smoker,  breath sounds clear to auscultation        Cardiovascular hypertension, Rhythm:Regular Rate:Normal     Neuro/Psych    GI/Hepatic   Endo/Other    Renal/GU      Musculoskeletal   Abdominal   Peds  Hematology   Anesthesia Other Findings   Reproductive/Obstetrics                           Anesthesia Physical Anesthesia Plan  ASA: IV and emergent  Anesthesia Plan: General   Post-op Pain Management:    Induction: Intravenous  Airway Management Planned: Oral ETT  Additional Equipment: CVP, Arterial line, 3D TEE and Ultrasound Guidance Line Placement  Intra-op Plan:   Post-operative Plan: Post-operative intubation/ventilation  Informed Consent: I have reviewed the patients History and Physical, chart, labs and discussed the procedure including the risks, benefits and alternatives for the proposed anesthesia with the patient or authorized representative who has indicated his/her understanding and acceptance.     Plan Discussed with: CRNA and Anesthesiologist  Anesthesia Plan Comments: (1. Ruptured Thoracic Ascending Aorta with pseudoaneurysm 2. PA compression with pulmonary artery thrombus  3. R. Ventricular dysfunction secondary to PA thrombus 4. Hypertension  Plan GA with CVP, TEE circulatory arrest  Roberts Gaudy, MD)        Anesthesia Quick Evaluation

## 2014-01-23 NOTE — ED Notes (Addendum)
Assumed care of patient at this time. Received reports from [place], Arts administrator. Dr. Roxy Manns, Cardiothoracic at bedside to assess patient. Pt alert and oriented at this time.

## 2014-01-23 NOTE — ED Notes (Signed)
Informed Dr. Darl Householder of lactic 2.33 and troponin 0.11..Marland KitchenMarland KitchenQA

## 2014-01-23 NOTE — ED Notes (Signed)
Pt's daughter and wife at bedside.

## 2014-01-23 NOTE — Brief Op Note (Addendum)
01/23/2014  11:45 PM  PATIENT:  Danny Frederick  80 y.o. male  PRE-OPERATIVE DIAGNOSIS:  Ruptured thoracic aortic aneurysm  POST-OPERATIVE DIAGNOSIS:  Ruptured thoracic aortic aneurysm  PROCEDURE:  Procedure(s):  Repair of Ruptured Ascending Thoracic Aortic Aneurysm with Pseuodaneurysm -Utilizing 32 mm Straight Hemashield Graft -Right Axillary Artery Cannulation with Deep Hypothermia and Low Flow Antegrade Cerebral Perfusion  SURGEON:    Rexene Alberts, MD  ASSISTANTS:  Ellwood Handler, PA-C  ANESTHESIA:   Roberts Gaudy, MD  PARTIAL CIRCULATORY ARREST:  9'  CROSSCLAMP TIME:   62'  CARDIOPULMONARY BYPASS TIME: 185'  FINDINGS:  Contained rupture of ascending thoracic aorta, presumably secondary to penetrating atherosclerotic ulcer  Large pseudoaneurysm with dense surrounding inflammation, compressing the main pulmonary artery  Severely inflamed and friable tissue  Perforation of LV apex requiring suture repair  COMPLICATIONS: None  BASELINE WEIGHT: 59  PATIENT DISPOSITION:   TO SICU IN STABLE BUT CRITICAL CONDITION  Rexene Alberts 01/24/2014 2:48 AM

## 2014-01-23 NOTE — ED Notes (Signed)
Per [facility] no need to print EMTALA.

## 2014-01-24 ENCOUNTER — Inpatient Hospital Stay (HOSPITAL_COMMUNITY): Payer: Medicare Other

## 2014-01-24 DIAGNOSIS — E871 Hypo-osmolality and hyponatremia: Secondary | ICD-10-CM | POA: Diagnosis present

## 2014-01-24 DIAGNOSIS — I776 Arteritis, unspecified: Secondary | ICD-10-CM | POA: Diagnosis not present

## 2014-01-24 DIAGNOSIS — K219 Gastro-esophageal reflux disease without esophagitis: Secondary | ICD-10-CM | POA: Diagnosis present

## 2014-01-24 DIAGNOSIS — R7881 Bacteremia: Secondary | ICD-10-CM | POA: Diagnosis not present

## 2014-01-24 DIAGNOSIS — Z7982 Long term (current) use of aspirin: Secondary | ICD-10-CM | POA: Diagnosis not present

## 2014-01-24 DIAGNOSIS — E872 Acidosis, unspecified: Secondary | ICD-10-CM | POA: Diagnosis not present

## 2014-01-24 DIAGNOSIS — A419 Sepsis, unspecified organism: Secondary | ICD-10-CM | POA: Diagnosis not present

## 2014-01-24 DIAGNOSIS — Z823 Family history of stroke: Secondary | ICD-10-CM | POA: Diagnosis not present

## 2014-01-24 DIAGNOSIS — E876 Hypokalemia: Secondary | ICD-10-CM | POA: Diagnosis not present

## 2014-01-24 DIAGNOSIS — R45851 Suicidal ideations: Secondary | ICD-10-CM | POA: Diagnosis not present

## 2014-01-24 DIAGNOSIS — Z833 Family history of diabetes mellitus: Secondary | ICD-10-CM | POA: Diagnosis not present

## 2014-01-24 DIAGNOSIS — N400 Enlarged prostate without lower urinary tract symptoms: Secondary | ICD-10-CM | POA: Diagnosis present

## 2014-01-24 DIAGNOSIS — D62 Acute posthemorrhagic anemia: Secondary | ICD-10-CM | POA: Diagnosis not present

## 2014-01-24 DIAGNOSIS — Z794 Long term (current) use of insulin: Secondary | ICD-10-CM | POA: Diagnosis not present

## 2014-01-24 DIAGNOSIS — Y832 Surgical operation with anastomosis, bypass or graft as the cause of abnormal reaction of the patient, or of later complication, without mention of misadventure at the time of the procedure: Secondary | ICD-10-CM | POA: Diagnosis not present

## 2014-01-24 DIAGNOSIS — Z7901 Long term (current) use of anticoagulants: Secondary | ICD-10-CM | POA: Diagnosis not present

## 2014-01-24 DIAGNOSIS — I951 Orthostatic hypotension: Secondary | ICD-10-CM | POA: Diagnosis not present

## 2014-01-24 DIAGNOSIS — R4701 Aphasia: Secondary | ICD-10-CM | POA: Diagnosis not present

## 2014-01-24 DIAGNOSIS — IMO0002 Reserved for concepts with insufficient information to code with codable children: Secondary | ICD-10-CM | POA: Diagnosis not present

## 2014-01-24 DIAGNOSIS — J90 Pleural effusion, not elsewhere classified: Secondary | ICD-10-CM | POA: Diagnosis not present

## 2014-01-24 DIAGNOSIS — A409 Streptococcal sepsis, unspecified: Secondary | ICD-10-CM | POA: Diagnosis not present

## 2014-01-24 DIAGNOSIS — Z9889 Other specified postprocedural states: Secondary | ICD-10-CM

## 2014-01-24 DIAGNOSIS — I2699 Other pulmonary embolism without acute cor pulmonale: Secondary | ICD-10-CM | POA: Diagnosis not present

## 2014-01-24 DIAGNOSIS — Z8052 Family history of malignant neoplasm of bladder: Secondary | ICD-10-CM | POA: Diagnosis not present

## 2014-01-24 DIAGNOSIS — R197 Diarrhea, unspecified: Secondary | ICD-10-CM | POA: Diagnosis present

## 2014-01-24 DIAGNOSIS — I719 Aortic aneurysm of unspecified site, without rupture: Secondary | ICD-10-CM | POA: Diagnosis present

## 2014-01-24 DIAGNOSIS — I711 Thoracic aortic aneurysm, ruptured, unspecified: Secondary | ICD-10-CM | POA: Diagnosis not present

## 2014-01-24 DIAGNOSIS — D689 Coagulation defect, unspecified: Secondary | ICD-10-CM | POA: Diagnosis present

## 2014-01-24 DIAGNOSIS — D696 Thrombocytopenia, unspecified: Secondary | ICD-10-CM | POA: Diagnosis present

## 2014-01-24 DIAGNOSIS — F329 Major depressive disorder, single episode, unspecified: Secondary | ICD-10-CM | POA: Diagnosis not present

## 2014-01-24 DIAGNOSIS — Y9210 Unspecified residential institution as the place of occurrence of the external cause: Secondary | ICD-10-CM | POA: Diagnosis not present

## 2014-01-24 DIAGNOSIS — I079 Rheumatic tricuspid valve disease, unspecified: Secondary | ICD-10-CM | POA: Diagnosis present

## 2014-01-24 DIAGNOSIS — Z8042 Family history of malignant neoplasm of prostate: Secondary | ICD-10-CM | POA: Diagnosis not present

## 2014-01-24 DIAGNOSIS — M25519 Pain in unspecified shoulder: Secondary | ICD-10-CM | POA: Diagnosis not present

## 2014-01-24 DIAGNOSIS — F29 Unspecified psychosis not due to a substance or known physiological condition: Secondary | ICD-10-CM | POA: Diagnosis not present

## 2014-01-24 DIAGNOSIS — B953 Streptococcus pneumoniae as the cause of diseases classified elsewhere: Secondary | ICD-10-CM | POA: Diagnosis not present

## 2014-01-24 DIAGNOSIS — R0989 Other specified symptoms and signs involving the circulatory and respiratory systems: Secondary | ICD-10-CM | POA: Diagnosis not present

## 2014-01-24 DIAGNOSIS — Z981 Arthrodesis status: Secondary | ICD-10-CM | POA: Diagnosis not present

## 2014-01-24 DIAGNOSIS — I10 Essential (primary) hypertension: Secondary | ICD-10-CM | POA: Diagnosis present

## 2014-01-24 DIAGNOSIS — I38 Endocarditis, valve unspecified: Secondary | ICD-10-CM | POA: Diagnosis not present

## 2014-01-24 DIAGNOSIS — Z87891 Personal history of nicotine dependence: Secondary | ICD-10-CM | POA: Diagnosis not present

## 2014-01-24 DIAGNOSIS — Z8679 Personal history of other diseases of the circulatory system: Secondary | ICD-10-CM

## 2014-01-24 DIAGNOSIS — A491 Streptococcal infection, unspecified site: Secondary | ICD-10-CM | POA: Diagnosis not present

## 2014-01-24 DIAGNOSIS — Z8249 Family history of ischemic heart disease and other diseases of the circulatory system: Secondary | ICD-10-CM | POA: Diagnosis not present

## 2014-01-24 DIAGNOSIS — J9819 Other pulmonary collapse: Secondary | ICD-10-CM | POA: Diagnosis not present

## 2014-01-24 HISTORY — DX: Personal history of other diseases of the circulatory system: Z86.79

## 2014-01-24 LAB — POCT I-STAT 7, (LYTES, BLD GAS, ICA,H+H)
ACID-BASE DEFICIT: 1 mmol/L (ref 0.0–2.0)
ACID-BASE DEFICIT: 3 mmol/L — AB (ref 0.0–2.0)
Acid-base deficit: 2 mmol/L (ref 0.0–2.0)
BICARBONATE: 24.2 meq/L — AB (ref 20.0–24.0)
BICARBONATE: 24.5 meq/L — AB (ref 20.0–24.0)
Bicarbonate: 22.2 mEq/L (ref 20.0–24.0)
CALCIUM ION: 0.99 mmol/L — AB (ref 1.13–1.30)
Calcium, Ion: 0.88 mmol/L — ABNORMAL LOW (ref 1.13–1.30)
Calcium, Ion: 1.05 mmol/L — ABNORMAL LOW (ref 1.13–1.30)
HCT: 20 % — ABNORMAL LOW (ref 39.0–52.0)
HEMATOCRIT: 25 % — AB (ref 39.0–52.0)
HEMATOCRIT: 37 % — AB (ref 39.0–52.0)
HEMOGLOBIN: 8.5 g/dL — AB (ref 13.0–17.0)
Hemoglobin: 6.8 g/dL — CL (ref 13.0–17.0)
Hemoglobin: 12.6 g/dL — ABNORMAL LOW (ref 13.0–17.0)
O2 SAT: 100 %
O2 Saturation: 100 %
O2 Saturation: 100 %
PH ART: 7.35 (ref 7.350–7.450)
PO2 ART: 329 mmHg — AB (ref 80.0–100.0)
POTASSIUM: 4.7 meq/L (ref 3.7–5.3)
Patient temperature: 36.2
Patient temperature: 35.7
Potassium: 4.1 mEq/L (ref 3.7–5.3)
Potassium: 4.7 mEq/L (ref 3.7–5.3)
SODIUM: 133 meq/L — AB (ref 137–147)
Sodium: 132 mEq/L — ABNORMAL LOW (ref 137–147)
Sodium: 133 mEq/L — ABNORMAL LOW (ref 137–147)
TCO2: 23 mmol/L (ref 0–100)
TCO2: 26 mmol/L (ref 0–100)
TCO2: 26 mmol/L (ref 0–100)
pCO2 arterial: 36.9 mmHg (ref 35.0–45.0)
pCO2 arterial: 43.4 mmHg (ref 35.0–45.0)
pCO2 arterial: 41.1 mmHg (ref 35.0–45.0)
pH, Arterial: 7.387 (ref 7.350–7.450)
pH, Arterial: 7.378 (ref 7.350–7.450)
pO2, Arterial: 298 mmHg — ABNORMAL HIGH (ref 80.0–100.0)
pO2, Arterial: 395 mmHg — ABNORMAL HIGH (ref 80.0–100.0)

## 2014-01-24 LAB — PREPARE FRESH FROZEN PLASMA
UNIT DIVISION: 0
Unit division: 0
Unit division: 0
Unit division: 0

## 2014-01-24 LAB — POCT I-STAT 3, ART BLOOD GAS (G3+)
ACID-BASE DEFICIT: 2 mmol/L (ref 0.0–2.0)
ACID-BASE DEFICIT: 1 mmol/L (ref 0.0–2.0)
ACID-BASE DEFICIT: 1 mmol/L (ref 0.0–2.0)
ACID-BASE DEFICIT: 1 mmol/L (ref 0.0–2.0)
Acid-base deficit: 2 mmol/L (ref 0.0–2.0)
Acid-base deficit: 9 mmol/L — ABNORMAL HIGH (ref 0.0–2.0)
BICARBONATE: 22.7 meq/L (ref 20.0–24.0)
BICARBONATE: 24.9 meq/L — AB (ref 20.0–24.0)
BICARBONATE: 23.6 meq/L (ref 20.0–24.0)
Bicarbonate: 18.2 mEq/L — ABNORMAL LOW (ref 20.0–24.0)
Bicarbonate: 22.4 mEq/L (ref 20.0–24.0)
Bicarbonate: 24.1 mEq/L — ABNORMAL HIGH (ref 20.0–24.0)
O2 SAT: 100 %
O2 SAT: 97 %
O2 SAT: 99 %
O2 Saturation: 100 %
O2 Saturation: 99 %
O2 Saturation: 99 %
PCO2 ART: 43.8 mmHg (ref 35.0–45.0)
PO2 ART: 121 mmHg — AB (ref 80.0–100.0)
PO2 ART: 127 mmHg — AB (ref 80.0–100.0)
Patient temperature: 96
Patient temperature: 97.3
TCO2: 24 mmol/L (ref 0–100)
TCO2: 20 mmol/L (ref 0–100)
TCO2: 24 mmol/L (ref 0–100)
TCO2: 26 mmol/L (ref 0–100)
TCO2: 25 mmol/L (ref 0–100)
TCO2: 25 mmol/L (ref 0–100)
pCO2 arterial: 35.1 mmHg (ref 35.0–45.0)
pCO2 arterial: 35.5 mmHg (ref 35.0–45.0)
pCO2 arterial: 43 mmHg (ref 35.0–45.0)
pCO2 arterial: 38.4 mmHg (ref 35.0–45.0)
pCO2 arterial: 39.6 mmHg (ref 35.0–45.0)
pH, Arterial: 7.418 (ref 7.350–7.450)
pH, Arterial: 7.227 — ABNORMAL LOW (ref 7.350–7.450)
pH, Arterial: 7.402 (ref 7.350–7.450)
pH, Arterial: 7.369 (ref 7.350–7.450)
pH, Arterial: 7.392 (ref 7.350–7.450)
pH, Arterial: 7.389 (ref 7.350–7.450)
pO2, Arterial: 295 mmHg — ABNORMAL HIGH (ref 80.0–100.0)
pO2, Arterial: 374 mmHg — ABNORMAL HIGH (ref 80.0–100.0)
pO2, Arterial: 83 mmHg (ref 80.0–100.0)
pO2, Arterial: 119 mmHg — ABNORMAL HIGH (ref 80.0–100.0)

## 2014-01-24 LAB — MAGNESIUM
Magnesium: 2.1 mg/dL (ref 1.5–2.5)
Magnesium: 3.4 mg/dL — ABNORMAL HIGH (ref 1.5–2.5)

## 2014-01-24 LAB — CBC
HCT: 25 % — ABNORMAL LOW (ref 39.0–52.0)
HCT: 41.3 % (ref 39.0–52.0)
HCT: 29.1 % — ABNORMAL LOW (ref 39.0–52.0)
HEMATOCRIT: 36.9 % — AB (ref 39.0–52.0)
HEMOGLOBIN: 8.7 g/dL — AB (ref 13.0–17.0)
Hemoglobin: 14.6 g/dL (ref 13.0–17.0)
Hemoglobin: 13.1 g/dL (ref 13.0–17.0)
Hemoglobin: 10.3 g/dL — ABNORMAL LOW (ref 13.0–17.0)
MCH: 28.6 pg (ref 26.0–34.0)
MCH: 28.9 pg (ref 26.0–34.0)
MCH: 28.8 pg (ref 26.0–34.0)
MCH: 28.5 pg (ref 26.0–34.0)
MCHC: 34.8 g/dL (ref 30.0–36.0)
MCHC: 35.4 g/dL (ref 30.0–36.0)
MCHC: 35.5 g/dL (ref 30.0–36.0)
MCHC: 35.4 g/dL (ref 30.0–36.0)
MCV: 82.2 fL (ref 78.0–100.0)
MCV: 81.6 fL (ref 78.0–100.0)
MCV: 81.1 fL (ref 78.0–100.0)
MCV: 80.4 fL (ref 78.0–100.0)
PLATELETS: 232 10*3/uL (ref 150–400)
PLATELETS: 188 10*3/uL (ref 150–400)
Platelets: 218 10*3/uL (ref 150–400)
Platelets: 165 10*3/uL (ref 150–400)
RBC: 3.04 MIL/uL — AB (ref 4.22–5.81)
RBC: 5.06 MIL/uL (ref 4.22–5.81)
RBC: 4.55 MIL/uL (ref 4.22–5.81)
RBC: 3.62 MIL/uL — ABNORMAL LOW (ref 4.22–5.81)
RDW: 15.3 % (ref 11.5–15.5)
RDW: 14.2 % (ref 11.5–15.5)
RDW: 14 % (ref 11.5–15.5)
RDW: 14.2 % (ref 11.5–15.5)
WBC: 22 10*3/uL — ABNORMAL HIGH (ref 4.0–10.5)
WBC: 24.3 10*3/uL — AB (ref 4.0–10.5)
WBC: 21.4 10*3/uL — AB (ref 4.0–10.5)
WBC: 16.1 10*3/uL — ABNORMAL HIGH (ref 4.0–10.5)

## 2014-01-24 LAB — POCT I-STAT 4, (NA,K, GLUC, HGB,HCT)
GLUCOSE: 141 mg/dL — AB (ref 70–99)
GLUCOSE: 227 mg/dL — AB (ref 70–99)
Glucose, Bld: 133 mg/dL — ABNORMAL HIGH (ref 70–99)
Glucose, Bld: 129 mg/dL — ABNORMAL HIGH (ref 70–99)
Glucose, Bld: 123 mg/dL — ABNORMAL HIGH (ref 70–99)
HCT: 28 % — ABNORMAL LOW (ref 39.0–52.0)
HCT: 24 % — ABNORMAL LOW (ref 39.0–52.0)
HCT: 42 % (ref 39.0–52.0)
HEMATOCRIT: 30 % — AB (ref 39.0–52.0)
HEMATOCRIT: 21 % — AB (ref 39.0–52.0)
HEMOGLOBIN: 7.1 g/dL — AB (ref 13.0–17.0)
Hemoglobin: 10.2 g/dL — ABNORMAL LOW (ref 13.0–17.0)
Hemoglobin: 9.5 g/dL — ABNORMAL LOW (ref 13.0–17.0)
Hemoglobin: 8.2 g/dL — ABNORMAL LOW (ref 13.0–17.0)
Hemoglobin: 14.3 g/dL (ref 13.0–17.0)
POTASSIUM: 3.8 meq/L (ref 3.7–5.3)
POTASSIUM: 7 meq/L — AB (ref 3.7–5.3)
POTASSIUM: 4.1 meq/L (ref 3.7–5.3)
Potassium: 4.3 mEq/L (ref 3.7–5.3)
Potassium: 4.2 mEq/L (ref 3.7–5.3)
SODIUM: 128 meq/L — AB (ref 137–147)
SODIUM: 126 meq/L — AB (ref 137–147)
Sodium: 128 mEq/L — ABNORMAL LOW (ref 137–147)
Sodium: 127 mEq/L — ABNORMAL LOW (ref 137–147)
Sodium: 135 mEq/L — ABNORMAL LOW (ref 137–147)

## 2014-01-24 LAB — POCT I-STAT, CHEM 8
BUN: 24 mg/dL — ABNORMAL HIGH (ref 6–23)
BUN: 26 mg/dL — AB (ref 6–23)
CALCIUM ION: 1.23 mmol/L (ref 1.13–1.30)
CREATININE: 1.2 mg/dL (ref 0.50–1.35)
Calcium, Ion: 1.24 mmol/L (ref 1.13–1.30)
Chloride: 103 mEq/L (ref 96–112)
Chloride: 101 mEq/L (ref 96–112)
Creatinine, Ser: 1.1 mg/dL (ref 0.50–1.35)
Glucose, Bld: 152 mg/dL — ABNORMAL HIGH (ref 70–99)
Glucose, Bld: 161 mg/dL — ABNORMAL HIGH (ref 70–99)
HCT: 36 % — ABNORMAL LOW (ref 39.0–52.0)
HCT: 28 % — ABNORMAL LOW (ref 39.0–52.0)
Hemoglobin: 12.2 g/dL — ABNORMAL LOW (ref 13.0–17.0)
Hemoglobin: 9.5 g/dL — ABNORMAL LOW (ref 13.0–17.0)
Potassium: 5 mEq/L (ref 3.7–5.3)
Potassium: 4.2 mEq/L (ref 3.7–5.3)
SODIUM: 137 meq/L (ref 137–147)
Sodium: 132 mEq/L — ABNORMAL LOW (ref 137–147)
TCO2: 26 mmol/L (ref 0–100)
TCO2: 23 mmol/L (ref 0–100)

## 2014-01-24 LAB — PREPARE PLATELET PHERESIS
UNIT DIVISION: 0
Unit division: 0

## 2014-01-24 LAB — CREATININE, SERUM
Creatinine, Ser: 1.06 mg/dL (ref 0.50–1.35)
GFR, EST AFRICAN AMERICAN: 76 mL/min — AB (ref >90)
GFR, EST NON AFRICAN AMERICAN: 65 mL/min — AB (ref >90)

## 2014-01-24 LAB — CARBOXYHEMOGLOBIN
CARBOXYHEMOGLOBIN: 1.7 % — AB (ref 0.5–1.5)
Carboxyhemoglobin: 1.9 % — ABNORMAL HIGH (ref 0.5–1.5)
METHEMOGLOBIN: 0.8 % (ref 0.0–1.5)
Methemoglobin: 0.9 % (ref 0.0–1.5)
O2 SAT: 65.4 %
O2 Saturation: 81.1 %
Total hemoglobin: 14.4 g/dL (ref 13.5–18.0)
Total hemoglobin: 9.7 g/dL — ABNORMAL LOW (ref 13.5–18.0)

## 2014-01-24 LAB — URINE CULTURE
Colony Count: NO GROWTH
Culture: NO GROWTH

## 2014-01-24 LAB — POCT I-STAT GLUCOSE
GLUCOSE: 107 mg/dL — AB (ref 70–99)
Glucose, Bld: 119 mg/dL — ABNORMAL HIGH (ref 70–99)
Glucose, Bld: 148 mg/dL — ABNORMAL HIGH (ref 70–99)
OPERATOR ID: 324421
OPERATOR ID: 3406
Operator id: 3406

## 2014-01-24 LAB — GLUCOSE, CAPILLARY
GLUCOSE-CAPILLARY: 89 mg/dL (ref 70–99)
GLUCOSE-CAPILLARY: 91 mg/dL (ref 70–99)
Glucose-Capillary: 107 mg/dL — ABNORMAL HIGH (ref 70–99)
Glucose-Capillary: 148 mg/dL — ABNORMAL HIGH (ref 70–99)
Glucose-Capillary: 150 mg/dL — ABNORMAL HIGH (ref 70–99)
Glucose-Capillary: 157 mg/dL — ABNORMAL HIGH (ref 70–99)
Glucose-Capillary: 160 mg/dL — ABNORMAL HIGH (ref 70–99)
Glucose-Capillary: 160 mg/dL — ABNORMAL HIGH (ref 70–99)
Glucose-Capillary: 95 mg/dL (ref 70–99)

## 2014-01-24 LAB — PROTIME-INR
INR: 1.67 — ABNORMAL HIGH (ref 0.00–1.49)
INR: 0.97 (ref 0.00–1.49)
PROTHROMBIN TIME: 19.2 s — AB (ref 11.6–15.2)
Prothrombin Time: 12.7 seconds (ref 11.6–15.2)

## 2014-01-24 LAB — BASIC METABOLIC PANEL
BUN: 23 mg/dL (ref 6–23)
CO2: 21 meq/L (ref 19–32)
Calcium: 8 mg/dL — ABNORMAL LOW (ref 8.4–10.5)
Chloride: 100 mEq/L (ref 96–112)
Creatinine, Ser: 0.72 mg/dL (ref 0.50–1.35)
GFR calc Af Amer: >90 mL/min (ref >90)
GFR calc non Af Amer: 87 mL/min — ABNORMAL LOW (ref >90)
Glucose, Bld: 123 mg/dL — ABNORMAL HIGH (ref 70–99)
Potassium: 4.3 mEq/L (ref 3.7–5.3)
SODIUM: 133 meq/L — AB (ref 137–147)

## 2014-01-24 LAB — APTT
APTT: 41 s — AB (ref 24–37)
aPTT: 38 seconds — ABNORMAL HIGH (ref 24–37)

## 2014-01-24 LAB — MRSA PCR SCREENING: MRSA by PCR: NEGATIVE

## 2014-01-24 LAB — PLATELET COUNT: Platelets: 80 10*3/uL — ABNORMAL LOW (ref 150–400)

## 2014-01-24 LAB — FIBRINOGEN: Fibrinogen: 307 mg/dL (ref 204–475)

## 2014-01-24 MED ORDER — PANTOPRAZOLE SODIUM 40 MG PO TBEC
40.0000 mg | DELAYED_RELEASE_TABLET | Freq: Every day | ORAL | Status: DC
  Administered 2014-01-26 – 2014-02-04 (×10): 40 mg via ORAL
  Filled 2014-01-24 (×10): qty 1

## 2014-01-24 MED ORDER — ACETAMINOPHEN 160 MG/5ML PO SOLN: 650.0000 mg | Freq: Once | ORAL | Status: AC

## 2014-01-24 MED ORDER — ALBUMIN HUMAN 5 % IV SOLN
250.0000 mL | INTRAVENOUS | Status: AC | PRN
  Administered 2014-01-24 (×4): 250 mL via INTRAVENOUS
  Filled 2014-01-24 (×3): qty 250

## 2014-01-24 MED ORDER — VANCOMYCIN HCL IN DEXTROSE 1-5 GM/200ML-% IV SOLN
1000.0000 mg | Freq: Two times a day (BID) | INTRAVENOUS | Status: DC
  Administered 2014-01-24 – 2014-01-25 (×3): 1000 mg via INTRAVENOUS
  Filled 2014-01-24 (×4): qty 200

## 2014-01-24 MED ORDER — SODIUM CHLORIDE 0.9 % IJ SOLN
10.0000 mL | INTRAMUSCULAR | Status: DC | PRN
  Administered 2014-01-30 – 2014-02-02 (×6): 10 mL
  Administered 2014-02-02: 20 mL
  Administered 2014-02-03 (×2): 10 mL
  Administered 2014-02-04: 20 mL

## 2014-01-24 MED ORDER — BISACODYL 10 MG RE SUPP: 10.0000 mg | Freq: Every day | RECTAL | Status: DC

## 2014-01-24 MED ORDER — ROCURONIUM BROMIDE 50 MG/5ML IV SOLN
INTRAVENOUS | Status: AC
  Filled 2014-01-24: qty 1

## 2014-01-24 MED ORDER — DEXMEDETOMIDINE HCL IN NACL 200 MCG/50ML IV SOLN
0.1000 ug/kg/h | INTRAVENOUS | Status: DC
  Filled 2014-01-24: qty 50

## 2014-01-24 MED ORDER — COAGULATION FACTOR VIIA RECOMB 1 MG IV SOLR
40.0000 ug/kg | Freq: Once | INTRAVENOUS | Status: AC
  Administered 2014-01-24: 2000 ug via INTRAVENOUS
  Filled 2014-01-24: qty 2

## 2014-01-24 MED ORDER — MAGNESIUM SULFATE 4000MG/100ML IJ SOLN
4.0000 g | Freq: Once | INTRAMUSCULAR | Status: AC
  Administered 2014-01-24: 4 g via INTRAVENOUS
  Filled 2014-01-24: qty 100

## 2014-01-24 MED ORDER — CHLORHEXIDINE GLUCONATE 0.12 % MT SOLN
15.0000 mL | Freq: Two times a day (BID) | OROMUCOSAL | Status: DC
  Administered 2014-01-25: 15 mL via OROMUCOSAL
  Filled 2014-01-24: qty 15

## 2014-01-24 MED ORDER — MIDAZOLAM HCL 2 MG/2ML IJ SOLN: 2.0000 mg | INTRAMUSCULAR | Status: DC | PRN

## 2014-01-24 MED ORDER — OXYCODONE HCL 5 MG PO TABS
5.0000 mg | ORAL_TABLET | ORAL | Status: DC | PRN
  Administered 2014-01-25: 10 mg via ORAL
  Filled 2014-01-24: qty 2

## 2014-01-24 MED ORDER — ACETAMINOPHEN 650 MG RE SUPP
650.0000 mg | Freq: Once | RECTAL | Status: AC
  Administered 2014-01-24: 650 mg via RECTAL

## 2014-01-24 MED ORDER — ASPIRIN 81 MG PO CHEW: 324.0000 mg | CHEWABLE_TABLET | Freq: Every day | ORAL | Status: DC

## 2014-01-24 MED ORDER — CALCIUM CHLORIDE 10 % IV SOLN
INTRAVENOUS | Status: DC | PRN
  Administered 2014-01-24 (×2): 0.5 g via INTRAVENOUS

## 2014-01-24 MED ORDER — ALBUMIN HUMAN 5 % IV SOLN
INTRAVENOUS | Status: AC
  Administered 2014-01-24: 12.5 g
  Filled 2014-01-24: qty 250

## 2014-01-24 MED ORDER — ASPIRIN EC 325 MG PO TBEC
325.0000 mg | DELAYED_RELEASE_TABLET | Freq: Every day | ORAL | Status: DC
  Filled 2014-01-24: qty 1

## 2014-01-24 MED ORDER — SODIUM CHLORIDE 0.9 % IJ SOLN: 3.0000 mL | INTRAMUSCULAR | Status: DC | PRN

## 2014-01-24 MED ORDER — MORPHINE SULFATE 2 MG/ML IJ SOLN: 1.0000 mg | INTRAMUSCULAR | Status: AC | PRN

## 2014-01-24 MED ORDER — SODIUM CHLORIDE 0.9 % IV SOLN
250.0000 mL | INTRAVENOUS | Status: AC
  Administered 2014-01-24: 1000 mL via INTRAVENOUS

## 2014-01-24 MED ORDER — ALBUMIN HUMAN 5 % IV SOLN
INTRAVENOUS | Status: DC | PRN
  Administered 2014-01-24: via INTRAVENOUS

## 2014-01-24 MED ORDER — FAMOTIDINE IN NACL 20-0.9 MG/50ML-% IV SOLN
20.0000 mg | Freq: Two times a day (BID) | INTRAVENOUS | Status: AC
  Administered 2014-01-24: 20 mg via INTRAVENOUS

## 2014-01-24 MED ORDER — LACTATED RINGERS IV SOLN: 500.0000 mL | Freq: Once | INTRAVENOUS | Status: AC | PRN

## 2014-01-24 MED ORDER — INSULIN REGULAR BOLUS VIA INFUSION
0.0000 [IU] | Freq: Three times a day (TID) | INTRAVENOUS | Status: DC
  Filled 2014-01-24: qty 10

## 2014-01-24 MED ORDER — DEXTROSE 5 % IV SOLN
0.0000 ug/min | INTRAVENOUS | Status: DC
  Filled 2014-01-24: qty 2

## 2014-01-24 MED ORDER — LEVOFLOXACIN IN D5W 750 MG/150ML IV SOLN
750.0000 mg | INTRAVENOUS | Status: DC
  Administered 2014-01-24: 750 mg via INTRAVENOUS
  Filled 2014-01-24 (×2): qty 150

## 2014-01-24 MED ORDER — MILRINONE IN DEXTROSE 20 MG/100ML IV SOLN
INTRAVENOUS | Status: DC | PRN
  Administered 2014-01-24: .3 ug/kg/min via INTRAVENOUS

## 2014-01-24 MED ORDER — SODIUM CHLORIDE 0.9 % IJ SOLN
3.0000 mL | Freq: Two times a day (BID) | INTRAMUSCULAR | Status: DC
  Administered 2014-01-25 (×2): 3 mL via INTRAVENOUS
  Administered 2014-01-26: 6 mL via INTRAVENOUS
  Administered 2014-01-26 – 2014-01-31 (×4): 3 mL via INTRAVENOUS

## 2014-01-24 MED ORDER — POTASSIUM CHLORIDE 10 MEQ/50ML IV SOLN: 10.0000 meq | INTRAVENOUS | Status: AC

## 2014-01-24 MED ORDER — LEVOTHYROXINE SODIUM 50 MCG PO TABS
50.0000 ug | ORAL_TABLET | Freq: Every day | ORAL | Status: DC
  Administered 2014-01-24 – 2014-02-04 (×12): 50 ug via ORAL
  Filled 2014-01-24 (×13): qty 1

## 2014-01-24 MED ORDER — METOPROLOL TARTRATE 1 MG/ML IV SOLN
2.5000 mg | INTRAVENOUS | Status: DC | PRN
Start: 2014-01-24 — End: 2014-01-26
  Administered 2014-01-25: 2.5 mg via INTRAVENOUS

## 2014-01-24 MED ORDER — BIOTENE DRY MOUTH MT LIQD
15.0000 mL | Freq: Two times a day (BID) | OROMUCOSAL | Status: DC
  Administered 2014-01-24 – 2014-02-03 (×18): 15 mL via OROMUCOSAL

## 2014-01-24 MED ORDER — METOPROLOL TARTRATE 12.5 MG HALF TABLET
12.5000 mg | ORAL_TABLET | Freq: Two times a day (BID) | ORAL | Status: DC
  Administered 2014-01-24: 12.5 mg via ORAL
  Filled 2014-01-24 (×4): qty 1

## 2014-01-24 MED ORDER — DOCUSATE SODIUM 100 MG PO CAPS
200.0000 mg | ORAL_CAPSULE | Freq: Every day | ORAL | Status: DC
  Administered 2014-01-25 – 2014-01-26 (×2): 200 mg via ORAL
  Filled 2014-01-24 (×2): qty 2

## 2014-01-24 MED ORDER — MILRINONE IN DEXTROSE 20 MG/100ML IV SOLN
0.3000 ug/kg/min | INTRAVENOUS | Status: DC
  Administered 2014-01-24: 0.2 ug/kg/min via INTRAVENOUS
  Filled 2014-01-24: qty 100

## 2014-01-24 MED ORDER — VANCOMYCIN HCL IN DEXTROSE 1-5 GM/200ML-% IV SOLN
1000.0000 mg | Freq: Once | INTRAVENOUS | Status: DC
  Filled 2014-01-24: qty 200

## 2014-01-24 MED ORDER — NITROGLYCERIN IN D5W 200-5 MCG/ML-% IV SOLN
0.0000 ug/min | INTRAVENOUS | Status: DC
  Administered 2014-01-26: 20 ug/min via INTRAVENOUS
  Filled 2014-01-24: qty 250

## 2014-01-24 MED ORDER — ACETAMINOPHEN 160 MG/5ML PO SOLN: 1000.0000 mg | Freq: Four times a day (QID) | ORAL | Status: DC

## 2014-01-24 MED ORDER — DEXTROSE 5 % IV SOLN
1.5000 g | Freq: Two times a day (BID) | INTRAVENOUS | Status: DC
  Administered 2014-01-24: 1.5 g via INTRAVENOUS
  Filled 2014-01-24 (×2): qty 1.5

## 2014-01-24 MED ORDER — SODIUM CHLORIDE 0.9 % IJ SOLN
10.0000 mL | Freq: Two times a day (BID) | INTRAMUSCULAR | Status: DC
  Administered 2014-01-24 – 2014-01-29 (×6): 10 mL
  Administered 2014-01-30: 20 mL
  Administered 2014-01-30: 10 mL
  Administered 2014-01-30: 20 mL
  Administered 2014-01-31 – 2014-02-02 (×2): 10 mL

## 2014-01-24 MED ORDER — LACTATED RINGERS IV SOLN: INTRAVENOUS | Status: DC

## 2014-01-24 MED ORDER — SODIUM CHLORIDE 0.9 % IV SOLN: INTRAVENOUS | Status: DC

## 2014-01-24 MED ORDER — METOPROLOL TARTRATE 25 MG/10 ML ORAL SUSPENSION
12.5000 mg | Freq: Two times a day (BID) | ORAL | Status: DC
  Filled 2014-01-24 (×4): qty 5

## 2014-01-24 MED ORDER — INSULIN ASPART 100 UNIT/ML ~~LOC~~ SOLN
0.0000 [IU] | SUBCUTANEOUS | Status: DC
  Administered 2014-01-24 – 2014-01-25 (×7): 2 [IU] via SUBCUTANEOUS

## 2014-01-24 MED ORDER — ONDANSETRON HCL 4 MG/2ML IJ SOLN
4.0000 mg | Freq: Four times a day (QID) | INTRAMUSCULAR | Status: DC | PRN
  Administered 2014-01-25: 4 mg via INTRAVENOUS
  Filled 2014-01-24: qty 2

## 2014-01-24 MED ORDER — SODIUM CHLORIDE 0.45 % IV SOLN: INTRAVENOUS | Status: DC

## 2014-01-24 MED ORDER — BISACODYL 5 MG PO TBEC
10.0000 mg | DELAYED_RELEASE_TABLET | Freq: Every day | ORAL | Status: DC
  Administered 2014-01-25 – 2014-01-26 (×2): 10 mg via ORAL
  Filled 2014-01-24 (×2): qty 2

## 2014-01-24 MED ORDER — MORPHINE SULFATE 2 MG/ML IJ SOLN
2.0000 mg | INTRAMUSCULAR | Status: DC | PRN
  Administered 2014-01-24 (×3): 2 mg via INTRAVENOUS
  Filled 2014-01-24 (×3): qty 1

## 2014-01-24 MED ORDER — SODIUM CHLORIDE 0.9 % IV SOLN
INTRAVENOUS | Status: DC
  Administered 2014-01-24: 3.2 [IU]/h via INTRAVENOUS

## 2014-01-24 MED ORDER — ACETAMINOPHEN 500 MG PO TABS
1000.0000 mg | ORAL_TABLET | Freq: Four times a day (QID) | ORAL | Status: AC
  Administered 2014-01-25 – 2014-01-29 (×18): 1000 mg via ORAL
  Filled 2014-01-24 (×20): qty 2

## 2014-01-24 MED ORDER — PROTAMINE SULFATE 10 MG/ML IV SOLN
INTRAVENOUS | Status: DC | PRN
  Administered 2014-01-24: 20 mg via INTRAVENOUS

## 2014-01-24 NOTE — Progress Notes (Addendum)
[address]       [place] [address]             [phone]        CARDIOTHORACIC SURGERY PROGRESS NOTE   R1 Day Post-Op Procedure(s) (LRB): THORACIC ASCENDING ANEURYSM REPAIR (AAA) (N/A)  Subjective: Sedated on vent.  Looks stable.  Objective: Vital signs: BP Readings from Last 1 Encounters:  01/24/14 87/58   Pulse Readings from Last 1 Encounters:  01/24/14 65   Resp Readings from Last 1 Encounters:  01/24/14 11   Temp Readings from Last 1 Encounters:  01/24/14 97.7 F (36.5 C) Oral    Hemodynamics: CVP:  [4 mmHg-11 mmHg] 8 mmHg  Physical Exam:  Rhythm:   sinus  Breath sounds: clear  Heart sounds:  RRR  Incisions:  Dressings dry, intact  Abdomen:  Soft, non-distended  Extremities:  Warm, well-perfused    Intake/Output from previous day: 05/25 0701 - 05/26 0700 In: 8500.3 [I.V.:2908.3; XKGYJ:8563; NG/GT:30; IV Piggyback:630] Out: 1497 [WYOVZ:8588; Blood:2075; Chest Tube:540] Intake/Output this shift: Total I/O In: 380.8 [I.V.:290.8; IV Piggyback:90] Out: 230 [Urine:120; Chest Tube:110]  Lab Results:  CBC: Recent Labs  01/24/14 0110  01/24/14 0330 01/24/14 0336  WBC 22.0*  --  24.3*  --   HGB 8.7*  < > 14.6 14.3  HCT 25.0*  < > 41.3 42.0  PLT 232  --  188  --   < > = values in this interval not displayed.  BMET:  Recent Labs  01/23/14 1615  01/24/14 0330 01/24/14 0336  NA 131*  < > 133* 135*  K 4.4  < > 4.3 4.1  CL 89*  --  100  --   CO2 22  --  21  --   GLUCOSE 137*  < > 123* 123*  BUN 39*  --  23  --   CREATININE 1.22  --  0.72  --   CALCIUM 9.4  --  8.0*  --   < > = values in this interval not displayed.   CBG (last 3)  No results found for this basename: GLUCAP,  in the last 72 hours  ABG    Component Value Date/Time   PHART 7.402 01/24/2014 0336   PCO2ART 35.5 01/24/2014 0336   PO2ART 83.0 01/24/2014 0336   HCO3 22.4 01/24/2014 0336   TCO2 24 01/24/2014 0336   ACIDBASEDEF 2.0 01/24/2014 0336   O2SAT  65.4 01/24/2014 0500    CXR: PORTABLE CHEST - 1 VIEW  COMPARISON: CTA of the chest performed 01/23/2014  FINDINGS:  The patient's endotracheal tube is seen ending 4-5 cm above the  carina.  A right IJ sheath is noted ending about the proximal SVC; a right IJ  line is noted ending at the distal SVC. An enteric tube is noted  extending below the diaphragm. Two mediastinal drains and bilateral  chest tubes are seen.  Vascular congestion is noted. Increased interstitial markings may  reflect mild atelectasis or minimal interstitial edema. No definite  pneumothorax is seen.  The cardiomediastinal silhouette is normal in size. The patient is  status post median sternotomy. There is residual prominence of the  superior mediastinum. No acute osseous abnormalities are identified.  IMPRESSION:  1. Endotracheal tube seen ending 4-5 cm above the carina.  2. Vascular congestion noted; increased interstitial markings may  reflect mild atelectasis or minimal interstitial edema.  3. Residual prominence of the superior mediastinum.  Electronically Signed  By: Garald Balding  M.D.  On: 01/24/2014 03:55    Assessment/Plan: S/P Procedure(s) (LRB): THORACIC ASCENDING ANEURYSM REPAIR (AAA) (N/A)  Stable initial postop Maintaining NSR w/ stable hemodynamics, on low dose milrinone for RV dysfunction Chest tube output low UOP adequate Labs okay w/ co-ox 65% CVP 9-12   Continue routine early postop  Avoid hypertension  Wean vent once he's awake  Continue empiric Vanc + Levaquin  No pharmacologic anticoagulation due to risk of bleeding + cause of PE has been eliminated  Check LE venous duplex for completeness   Rexene Alberts 01/24/2014 9:29 AM

## 2014-01-24 NOTE — Anesthesia Postprocedure Evaluation (Signed)
  Anesthesia Post-op Note  Patient: International aid/development worker  Procedure(s) Performed: Procedure(s): THORACIC ASCENDING ANEURYSM REPAIR (AAA) (N/A)  Patient Location: SICU  Anesthesia Type:General  Level of Consciousness: sedated and Patient remains intubated per anesthesia plan  Airway and Oxygen Therapy: Patient remains intubated per anesthesia plan and Patient placed on Ventilator (see vital sign flow sheet for setting)  Post-op Pain: none  Post-op Assessment: Post-op Vital signs reviewed, Patient's Cardiovascular Status Stable, Respiratory Function Stable, Patent Airway, No signs of Nausea or vomiting and Pain level controlled  Post-op Vital Signs: stable  Last Vitals:  Filed Vitals:   01/24/14 0600  BP: 100/65  Pulse: 73  Temp:   Resp: 21    Complications: No apparent anesthesia complications

## 2014-01-24 NOTE — Op Note (Signed)
CARDIOTHORACIC SURGERY OPERATIVE NOTE  Date of Procedure:  01/24/2014  Preoperative Diagnosis: Ruptured Ascending Thoracic Aortic Aneurysm  Postoperative Diagnosis: Same   Procedure:    Repair of Ruptured Ascending Thoracic Aortic Aneurysm  32 mm Hemashield Straight Graft Repair  Right Axillary Artery Cannulation  Deep Hypothermia with Low Flow Antegrade Partial Circulatory Arrest   Surgeon: Valentina Gu. Roxy Manns, MD  Assistant: Ellwood Handler, PA-C  Anesthesia: Roberts Gaudy, MD  Operative Findings: Contained rupture of ascending thoracic aorta, presumably secondary to penetrating atherosclerotic ulcer  Large pseudoaneurysm with dense surrounding inflammation, compressing the main pulmonary artery  Severely inflamed and friable tissue  Perforation of LV apex requiring suture repair           BRIEF CLINICAL NOTE AND INDICATIONS FOR SURGERY  Patient is a 80 year old male with history of hypertension, GE reflux disease, benign prostatic hypertrophy, and degenerative disc disease of the cervical and lumbar spine with otherwise been very healthy for most of his life and physically very active. Beginning in January of this year the patient began to experience progressive generalized weakness and fatigue. He reported occasional double substernal chest pain, but this was relatively mild in severity. He was treated for depression. 6 weeks ago while traveling in [place] he was evaluated in the emergency room for severe weakness. No specific diagnosis was discovered. Over the past 2 weeks the patient has developed progressive exertional shortness of breath. He now get short of breath with minimal exertion. He denies resting shortness of breath. He denies ongoing chest pain. He has not had fevers or chills. He has not had any dizzy spells or syncope. He has not had any hoarseness of his voice. He denies any productive cough, hemoptysis, or wheezing. He has had poor appetite and he thinks he  has lost 10 pounds of weight. Symptoms have become acutely worse over the last few days, prompting his family to urgently go to the emergency room. He was initially evaluated in the emergency department at [facility]. The area was notably tachycardic and mildly hypotensive. He had low-grade fever. Blood work was notable for leukocytosis and mild lactic acidosis. Chest x-ray demonstrated widening mediastinum with prominence of the left hilum. A CT angiogram of the chest was performed to rule out pulmonary embolus. This reveals a large aneurysm of the distal ascending thoracic aorta that appears to have contained rupture. There is blood in the anterior mediastinum. The aneurysm compresses the pulmonary artery. There is a small pulmonary embolus. The patient was sent directly to [facility] emergency department for cardiothoracic surgical consultation.  The patient has been seen in consultation in the ED and counseled at length regarding the indications, risks and potential benefits of surgery.  All questions have been answered, and the patient provides full informed consent for the operation as described.    DETAILS OF THE OPERATIVE PROCEDURE  The patient is brought to the operating room on the above mentioned date and central monitoring was established by the anesthesia team including placement of a central venous catheter and a left brachial arterial line. A Swan-Ganz catheter is not placed because of the suspicion of pulmonary embolus noted on preoperative CT scan.  Trans cranial cerebral oxygen saturation monitoring is employed.  The patient is placed in the supine position on the operating table.  Intravenous antibiotics are administered. General endotracheal anesthesia is induced uneventfully. A Foley catheter is placed.  Baseline transesophageal echocardiogram was performed.  Findings were notable for Normal left ventricular size and  systolic function. The right ventricle is  moderately dilated. There is mild right ventricular dysfunction. There is mild tricuspid regurgitation. Aortic valve is normal. The mitral valve is normal. Examination of the aorta confirms presence of very large aneurysm or false aneurysm extending posteriorly off of the ascending aorta and compressing the pulmonary artery. The main pulmonary artery is severely compressed. There appears to be a small pulmonary embolus.  The patient's chest, abdomen, both groins, and both lower extremities are prepared and draped in a sterile manner. A time out procedure is performed.  A small incision is made in the right deltopectoral groove. The pectoralis fascia is incised and the pectoralis minor muscle is retracted laterally. The right axillary artery is exposed proximally and distally. The right common femoral vein is cannulated using the Seldinger technique and a flexible guidewire is advanced under TEE guidance through the right atrium into the superior vena cava.  A median sternotomy incision is performed and the sternum is divided.  Hemostasis on the bone edges is ascertained.  Purulent liquid is noted to emanate from the superior mediastinum. Swab culture of the fluid is obtained. Stat Gram stain is notable for inflammatory cells with no organisms seen.  The patient is heparinized systemically. An 8 mm Gore-Tex graft with pre-attached cardiopulmonary tubing connector is sewn in end to side fashion onto the right axillary artery for arterial cannulation. A 22 French long femoral venous cannula is passed under TEE guidance through the right atrium until the tip extends up the superior vena cava. Cardioplegia bypass was begun. Vacuum assist venous drainage is utilized.  The pericardium is carefully opened inferiorly to expose the right atrium.   A retrograde cardioplegia cannula is placed through the right atrium into the coronary sinus.  Dissection is continued along the right atrial wall and a left ventricular vent  is placed through the right superior pulmonary vein.  Systemic cooling is begun and the patient is cooled to 18C systemic temperature.  Dissection was now continued proximally to expose the anterior mediastinum. There is a large inflammatory mass.  Dissection is carefully performed along the right anterolateral surface of the aorta away from the known pseudoaneurysm. Portions of the inflammatory mass or sent for histology, and frozen section histology is notable for the absence of malignant cells.  The innominate artery is dissected free and is notably well above the superior extent of the inflammatory mass. The inflammatory mass involves the innominate vein, the majority of the ascending aorta beginning just above the sinotubular junction, and the main pulmonary artery.  Once the patient reaches 18C systemic temperature high dose Etomodate and Solu-Medrol are administered. The patient is placed in Trendelenburg position. The operative field was flooded with carbon dioxide gas. Cardiopulmonary bypass flow was turned down to 500 mL per minute and a cross-clamp applied across the innominate artery.  Cardioplegia is given retrograde through the coronary sinus catheter.  Iced saline slush is applied for topical hypothermia.  The initial cardioplegic arrest is rapid with early diastolic arrest.  Repeat doses of cardioplegia are administered intermittently throughout the entire cross clamp portion of the operation through the coronary sinus catheter in order to maintain completely flat electrocardiogram.  Myocardial protection was felt to be excellent.  The ascending aorta is incised longitudinally. The proximal end of the aorta appears essentially normal. There is a large hole measuring at least 2 x 3 cm in size in the posterior wall of the ascending aorta with a very large false aneurysm extending posteriorly and  towards the left, immediately overlying the pulmonary artery. The distal aorta is normal caliber but  severely diseased with atherosclerosis. The false aneurysm is excised in portions of the debrided tissue were sent to pathology for histology. Again frozen section histology is notable for the absence of any malignant cells. The entire inflammatory mass is debrided. It is clear by the density and nature of the inflammation that this false aneurysm has been present for a considerable period of time, probably several months.  Once the false aneurysm has been completely debrided, the mediastinum was irrigated with warm saline and antibiotic containing solution. The proximal and distal ends of the aorta are trimmed. The distal end is severely diseased and beveled underneath the aortic arch just proximal to the level of the innominate artery.  The ascending aorta is replaced using a 32 mm Hemashield woven double velour vascular graft.  The distal end is contracted first using running 3-0 Prolene suture with Teflon felt strips to buttress the suture line. The back wall of the anastomosis is reinforced using interrupted 3-0 pledgeted Prolene sutures on the endoluminal surface of the graft.    After completion of the distal anastomosis, all air was evacuated from the aortic arch and graft and a cross-clamp applied to the graft. Full flow cardiopulmonary bypass is resumed after a total duration of  57 minutes of low flow antegrade cerebral perfusion partial circulatory arrest. Rewarming is begun.  The distal anastomosis is inspected for hemostasis.  The proximal end of the graft is trimmed and beveled to an appropriate length. The proximal anastomosis was performed using running 3-0 Prolene suture with Teflon felt strips to buttress the suture line.  One final dose of warm retrograde "hot shot" cardioplegia was administered retrograde through the coronary sinus catheter while all air was evacuated through the aortic graft.  The aortic cross clamp was removed after a total cross clamp time of 80 minutes.  Both the  proximal and distal anastomoses were inspected for hemostasis.  Epicardial pacing wires are fixed to the right ventricular outflow tract and to the right atrial appendage. The patient is rewarmed to 37C temperature. The left ventricular vent is removed.  At this juncture some bleeding was noted from the anterior right ventricular wall. On inspection it appears it is tracking from close to the left ventricular apex. The tissues very friable and inflamed, but it is presumed that perhaps the apex was perforated with the left ventricular vent. Several pledgeted 2-0 Ethibond sutures are placed to control the left ventricular apex.  The patient is weaned and disconnected from cardiopulmonary bypass.  The patient's rhythm at separation from bypass was sinus.  The patient was weaned from cardioplegic bypass on low dose milrionone. Total cardiopulmonary bypass time for the operation was 185 minutes.  Followup transesophageal echocardiogram performed after separation from bypass revealed normal left ventricular size and systolic function. There remains mild right ventricular dysfunction. The aortic valve is normal. The mitral valve is normal. The pulmonary artery appears normal with no residual external compression and normal antegrade laminar flow.  Protamine was administered to reverse the anticoagulation. The right axillary graft is amputated with a vascular stapler and the femoral venous cannula is removed. Manual pressures held on the right groin for 30 minutes.  The mediastinum and pleural space were inspected for hemostasis and irrigated with saline solution.  There is severe coagulopathy. The patient is initially transfused 2 packs of adult platelets and 4 units fresh frozen plasma for thrombocytopenia and coagulopathy. Coagulopathy  persists. Ultimately the patient is administered low-dose Novo-7 recombinant factor VII. This juncture some bleeding increases from the left ventricular apex. The patient is  placed in steep Trendelenburg position with table rotated towards the surgeon's side. Moist laps are placed behind the heart to elevate the left ventricular apex into the surgical field. Several additional pledgeted 2-0 Ethibond sutures are placed to control bleeding from the left ventricular apex. Subsequently hemostasis is ascertained.  The mediastinum and both pleural spaces were drained using 4 chest tubes placed through separate stab incisions inferiorly.  The soft tissues anterior to the aorta were reapproximated loosely. The sternum is closed with double strength sternal wire. The soft tissues anterior to the sternum were closed in multiple layers and the skin is closed with a running subcuticular skin closure.  The small incision in the right deltopectoral groove and was closed in multiple layers in routine fashion.  The patient tolerated the procedure well and is transported to the surgical intensive care in stable condition. There are no intraoperative complications. All sponge instrument and needle counts are verified correct at completion of the operation.    Valentina Gu. Roxy Manns MD 01/24/2014 2:55 AM

## 2014-01-24 NOTE — Progress Notes (Signed)
ANTIBIOTIC CONSULT NOTE - INITIAL  Pharmacy Consult for vancomycin, Levaquin Indication: false aneurysm  No Known Allergies Patient Measurements: Weight: 154 lb 15.7 oz (70.3 kg) Vital Signs: Temp: 97.7 F (36.5 C) (05/26 0806) Temp src: Oral (05/26 0806) BP: 100/58 mmHg (05/26 1000) Pulse Rate: 70 (05/26 1000) Intake/Output from previous day: 05/25 0701 - 05/26 0700 In: 8500.3 [I.V.:2908.3; FOYDX:4128; NG/GT:30; IV NOMVEHMCN:470] Out: 9628 [ZMOQH:4765; Blood:2075; Chest Tube:540] Intake/Output from this shift: Total I/O In: 774.9 [I.V.:434.9; IV Piggyback:340] Out: 295 [Urine:150; Chest Tube:145]  Labs:  Recent Labs  01/23/14 1615  01/24/14 0110  01/24/14 0330 01/24/14 0336 01/24/14 0915 01/24/14 0937  WBC 26.1*  --  22.0*  --  24.3*  --  21.4*  --   HGB 12.4*  < > 8.7*  < > 14.6 14.3 13.1 12.2*  PLT 587*  < > 232  --  188  --  218  --   CREATININE 1.22  --   --   --  0.72  --   --  1.10  < > = values in this interval not displayed. The CrCl is unknown because both a height and weight (above a minimum accepted value) are required for this calculation.  Microbiology: Recent Results (from the past 720 hour(s))  CULTURE, BLOOD (ROUTINE X 2)     Status: None   Collection Time    01/23/14  4:45 PM      Result Value Ref Range Status   Specimen Description BLOOD BLOOD RIGHT FOREARM   Final   Special Requests BOTTLES DRAWN AEROBIC AND ANAEROBIC 4ML   Final   Culture  Setup Time     Final   Value: 01/23/2014 21:33     Performed at [facility]   Culture     Final   Value:        BLOOD CULTURE RECEIVED NO GROWTH TO DATE CULTURE WILL BE HELD FOR 5 DAYS BEFORE ISSUING A FINAL NEGATIVE REPORT     Performed at [facility]   Report Status PENDING   Incomplete  CULTURE, BLOOD (ROUTINE X 2)     Status: None   Collection Time    01/23/14  4:47 PM      Result Value Ref Range Status   Specimen Description BLOOD RIGHT ANTECUBITAL   Final   Special Requests  BOTTLES DRAWN AEROBIC AND ANAEROBIC 4ML   Final   Culture  Setup Time     Final   Value: 01/23/2014 21:33     Performed at [facility]   Culture     Final   Value:        BLOOD CULTURE RECEIVED NO GROWTH TO DATE CULTURE WILL BE HELD FOR 5 DAYS BEFORE ISSUING A FINAL NEGATIVE REPORT     Performed at [facility]   Report Status PENDING   Incomplete  GRAM STAIN     Status: None   Collection Time    01/23/14  8:43 PM      Result Value Ref Range Status   Specimen Description FLUID   Final   Special Requests MEDIASTINAL FLUID   Final   Gram Stain     Final   Value: RARE WBC PRESENT,BOTH PMN AND MONONUCLEAR     NO ORGANISMS SEEN     CALLED TO C.YATES,RN 2036 01/23/14   Report Status 01/23/2014 FINAL   Final  BODY FLUID CULTURE     Status: None   Collection Time    01/23/14  8:43 PM      Result Value Ref Range Status   Specimen Description FLUID   Final   Special Requests MEDIASTINAL FLUID   Final   Gram Stain     Final   Value: RARE WBC PRESENT,BOTH PMN AND MONONUCLEAR     NO ORGANISMS SEEN     Gram Stain Report Called to,Read Back By and Verified With: Gram Stain Report Called to,Read Back By and Verified WithJule Economy RN 2036 01/23/14 Performed at [facility]     Performed at [facility]   Culture PENDING   Incomplete   Report Status PENDING   Incomplete  MRSA PCR SCREENING     Status: None   Collection Time    01/24/14  6:30 AM      Result Value Ref Range Status   MRSA by PCR NEGATIVE  NEGATIVE Final   Comment:            The GeneXpert MRSA Assay (FDA     approved for NASAL specimens     only), is one component of a     comprehensive MRSA colonization     surveillance program. It is not     intended to diagnose MRSA     infection nor to guide or     monitor treatment for     MRSA infections.    Medical History: Past Medical History  Diagnosis Date  . Hypertension   . Unspecified hypothyroidism   . Mitral valve prolapse     h/o;  normal echo 12/2011 with no MVP seen  . Foot drop, right   . BPH (benign prostatic hypertrophy)   . Iron deficiency anemia, unspecified 6/09  . Hearing loss in left ear   . Diverticulosis of colon 12/09  . Internal hemorrhoids   . GERD (gastroesophageal reflux disease)   . Allergic rhinitis, cause unspecified     on allergy shots (Dr. Orvil Feil)  . Allergic conjunctivitis   . Pulmonary embolus 01/23/2014  . Ruptured thoracic aortic aneurysm 01/23/2014    Medications:  Anti-infectives   Start     Dose/Rate Route Frequency Ordered Stop   01/24/14 1100  vancomycin (VANCOCIN) IVPB 1000 mg/200 mL premix  Status:  Discontinued     1,000 mg 200 mL/hr over 60 Minutes Intravenous  Once 01/24/14 0306 01/24/14 0934   01/24/14 0700  cefUROXime (ZINACEF) 1.5 g in dextrose 5 % 50 mL IVPB  Status:  Discontinued     1.5 g 100 mL/hr over 30 Minutes Intravenous Every 12 hours 01/24/14 0306 01/24/14 0934   01/23/14 2056  polymyxin B 500,000 Units, bacitracin 50,000 Units in sodium chloride irrigation 0.9 % 500 mL irrigation  Status:  Discontinued       As needed 01/23/14 2057 01/24/14 0306   01/23/14 1845  vancomycin (VANCOCIN) 1,250 mg in sodium chloride 0.9 % 250 mL IVPB     1,250 mg 166.7 mL/hr over 90 Minutes Intravenous To Surgery 01/23/14 1843 01/23/14 1936   01/23/14 1845  cefUROXime (ZINACEF) 1.5 g in dextrose 5 % 50 mL IVPB     1.5 g 100 mL/hr over 30 Minutes Intravenous To Surgery 01/23/14 1843 01/24/14 0035   01/23/14 1845  vancomycin (VANCOCIN) 1,000 mg in sodium chloride 0.9 % 1,000 mL irrigation      Irrigation To Surgery 01/23/14 1836 01/23/14 2022   01/23/14 1830  cefUROXime (ZINACEF) 750 mg in dextrose 5 % 50 mL IVPB  Status:  Discontinued  750 mg 100 mL/hr over 30 Minutes Intravenous To Surgery 01/23/14 1836 01/24/14 0306   01/23/14 1645  levofloxacin (LEVAQUIN) IVPB 750 mg     750 mg 100 mL/hr over 90 Minutes Intravenous  Once 01/23/14 1633 01/23/14 1822     Assessment: 80 YOM  s/p thoracic AAA repair overnight receiving levaquin 750mg  x1 at 1652 and vancomycin 1g x1 at 1936PM as well as Zinacef during surgery to continue vancomycin and levequin post operatively.  WBC elevated post-op as expected at 21.4. SCr 1.10/estCrCl 60-6mL/min.   Blood cx 5/25 >> Mediastinal fluid 5/25 >> Urine cx 5/25 >> 5/26 MRSA PCR negative  Goal of Therapy:  Vancomycin trough level 15-20 mcg/ml  Plan:  1. Levaquin 750mg  IV q24h - next dose this PM.  2. Vancomycin 1000mg  IV q12h - next dose now.   Sloan Leiter, PharmD, BCPS Clinical Pharmacist [phone] 01/24/2014,10:17 AM

## 2014-01-24 NOTE — Progress Notes (Signed)
Intra-operative Transesophageal Report:  Danny Frederick is a 80 year old male with a history of hypertension who presented with a 5 month history of progressive generalized weakness and fatigue which had progressively worsened over the last 2 weeks. Chest x-ray demonstrated a widened mediastinum and a CT angiogram of the chest revealed a large pseudoaneurysm of the ascending aorta. There was also compression of the pulmonary artery and a small pulmonary thrombus present. He is now scheduled to undergo repair of the ascending aortic pseudoaneurysm by Dr. Roxy Manns. Intraoperative transesophageal echocardiography was indicated to evaluate the pseudoaneurysm and pulmonary thrombus, to serve as a monitor for intraoperative volume status, to determine if any valvular pathology was present, and to serve as a monitor for intra-cardiac air.  The patient was brought to the operating room at [facility] and general anesthesia was induced without difficulty. Following endotracheal intubation and orogastric suctioning, the transesophageal echocardiography probe was inserted into the esophagus without difficulty.  Impression: Pre-bypass findings:  1. Aortic valve: Aortic valve was trileaflet. The leaflets were thin and pliable and opened normally without restriction. There was no aortic insufficiency.  2. Ascending aorta: There was a well-defined aortic root and sinotubular ridge without effacement. However, distal to the sinotubular junction in the area where the right pulmonary artery passes posterior to the aortic aorta, there was a discontinuity in the wall of the of the   ascending aorta with a surrounding rim of thrombus. There was swirling of blood in what appeared to be a pseudoaneurysm. The exact dimensions  of the pseudoaneurysm could not be determined.   3. Pulmonary artery: Distal to the pulmonic valve at the bifurcation of the pulmonary artery, there was compression of the main pulmonary artery and  right pulmonary artery. There was flow acceleration and of evidence thrombus within the main and right pulmonary arteries.  4. Mitral valve: The mitral valve leaflets were thin and pliable and coapted normally without prolapse or fluttering. There was trace mitral insufficiency.  5. Left ventricle: The left ventricular cavity was in normal size and measured 3.4 cm at end-diastole at the mid-papillary level in the transgastric short axis view. He was no left ventricular hypertrophy. The anterior wall thickness measured 0.82 and the posterior wall measured 0.88 cm. at end-diastole at the mid-papillary level in the transgastric short axis view. There was mild diastolic flattening of the interventricular septum. There was mild hypokinesis of the interventricular septum but the remainder of the ventricular segments appear to contract normally. Ejection fraction was estimated at 55-60%.  6. Right ventricle: The right ventricular cavity was moderately enlarged. There was hypokinesis of the right ventricular free wall especially in the basilar region. This was consistent with moderate right ventricular dysfunction.   7. Tricuspid valve: The tricuspid valve appeared structurally normal and there was trace to 1+ tricuspid insufficiency.  8. Interatrial septum: The interatrial septum was intact without evidence of patent foramen ovale or atrial septal defect by color Doppler.  9. Left atrium: There was no thrombus noted in the left atrium or left atrial appendage.  10. Descending aorta: There was scattered atheromatous disease within the wall of the descending aorta and grade 2  atheromas noted. The descending aortic diameter was 2.67 cm.  Post-bypass findings:  1. Aortic valve: The aortic valve appeared unchanged from the pre-bypass study. The leaflets opened normally without restriction and there was no aortic insufficiency.  2. Ascending aorta: There was a well-defined aortic root and sinotubular ridge  without effacement. However distal to the sinotubular  ridge, there was a vascular graft noted. There was no turbulence or evidence of leakage within the graft.  3. Pulmonary artery: The pulmonary artery no longer appeared compressed. There was laminar low velocity flow within the pulmonary trunk and right pulmonary artery. No thrombus could be appreciated in the post-bypass views.  4. Left ventricle: Left ventricular size appeared normal and unchanged from the pre-bypass study. There was mild hypocontractility of the interventricular septum. There were no other regional wall motion abnormalities and ejection fraction was estimated at 55-60%.  5. Right ventricle: The right ventricular cavity was enlarged. Again there was some hypokinesis of the right ventricular free wall especially in the basilar region. There was this was consistent with moderate right ventricular dysfunction.  6. Tricuspid valve: The tricuspid valve again appeared structurally unremarkable and there was 1+ tricuspid insufficiency noted.  Roberts Gaudy, M.D.

## 2014-01-24 NOTE — Procedures (Signed)
Extubation Procedure Note  Patient Details:   Name: [name] DOB: [birth year] MRN: [id number]   Airway Documentation:     Evaluation  O2 sats: stable throughout Complications: No apparent complications Patient did tolerate procedure well. Bilateral Breath Sounds: Clear   Yes Placed to 4lmin [place] FVC-642ml NIF-30 Vocalizes well.  Jones [facility] 01/24/2014, 6:29 PM

## 2014-01-24 NOTE — Progress Notes (Signed)
CRITICAL VALUE ALERT  Critical value received:  + Blood Culture (Gram Positive Cocci in Pairs/Chains)  Date of notification:  01/24/2014  Time of notification:  1200  Critical value read back:yes  Nurse who received alert:  Lillia Dallas  MD notified (1st page):  1230  Time of first page:  1230  MD notified (2nd page):  Time of second page:  Responding MD:  Dr. Roxy Manns  Time MD responded:  1230

## 2014-01-24 NOTE — Addendum Note (Signed)
Addendum created 01/24/14 0848 by Roberts Gaudy, MD   Modules edited: Clinical Notes   Clinical Notes:  File: [id number]; Pend: [id number]; Pend: [id number]; Pend: [id number]; Pend: [id number]; Pend: [id number]; Pend: [id number]; Pend: [id number]; [place]: [id number]

## 2014-01-24 NOTE — Progress Notes (Signed)
INITIAL NUTRITION ASSESSMENT  DOCUMENTATION CODES Per approved criteria  -Not Applicable   INTERVENTION:  If TF started, recommend Vital AF 1.2 formula -- initiate at 20 ml/hr and increase by 10 ml every 4 hours to goal rate of 60 ml/hr with Prostat liquid protein 30 ml daily to provide 1828 kcals, 123 gm protein, 1168 ml of free water RD to follow for nutrition care plan  NUTRITION DIAGNOSIS: Inadequate oral intake related to inability to eat as evidenced by NPO status  Goal: Pt to meet >/= 90% of their estimated nutrition needs   Monitor:  TF initiation & tolerance, respiratory status, weight, labs, I/O's  Reason for Assessment: VDRF  80 y.o. male  Admitting Dx: Ruptured thoracic aortic aneurysm  ASSESSMENT: 80 year old male with history of HTN, GE reflux disease, benign prostatic hypertrophy, and degenerative disc disease of the cervical and lumbar spine; chest x-ray demonstrated widening mediastinum with prominence of the left hilum.  A CT angiogram of the chest was performed to rule out pulmonary embolus. This reveals a large aneurysm of the distal ascending thoracic aorta that appears to have contained rupture. Sent directly to [facility] ED for cardiothoracic surgical consultation.  Patient s/p procedure 5/25: REPAIR OF RUPTURED ASCENDING THORACIC AORTIC ANEURYSM  Patient is currently intubated on ventilator support -- OGT in place MV: 9.3 L/min Temp (24hrs), Avg:98.3 F (36.8 C), Min:96 F (35.6 C), Max:100.5 F (38.1 C)   Height: Ht Readings from Last 1 Encounters:  01/02/14 5\' 3"  (1.6 m)    Weight: Wt Readings from Last 1 Encounters:  01/24/14 154 lb 15.7 oz (70.3 kg)    Ideal Body Weight: 124 lb  % Ideal Body Weight: 129%  Wt Readings from Last 10 Encounters:  01/24/14 154 lb 15.7 oz (70.3 kg)  01/24/14 154 lb 15.7 oz (70.3 kg)  01/02/14 131 lb (59.421 kg)  12/22/13 133 lb (60.328 kg)  12/08/13 136 lb (61.689 kg)  09/16/13 142 lb (64.411 kg)   07/14/13 138 lb (62.596 kg)  06/23/13 133 lb 3.2 oz (60.419 kg)  06/10/13 133 lb (60.328 kg)  05/27/13 133 lb (60.328 kg)    Usual Body Weight: 131 lb  % Usual Body Weight: 117%  BMI:  Body mass index is 27.46 kg/(m^2).  Estimated Nutritional Needs: Kcal: 1700-1850 Protein: 115-125 gm Fluid: per MD  Skin: chest surgical incision   Diet Order: NPO  EDUCATION NEEDS: -No education needs identified at this time   Intake/Output Summary (Last 24 hours) at 01/24/14 1526 Last data filed at 01/24/14 1500  Gross per 24 hour  Intake 10426.79 ml  Output   5440 ml  Net 4986.79 ml    Labs:   Recent Labs Lab 01/23/14 1615  01/24/14 0228 01/24/14 0330 01/24/14 0336 01/24/14 0915 01/24/14 0937  NA 131*  < > 133* 133* 135*  --  132*  K 4.4  < > 4.7 4.3 4.1  --  5.0  CL 89*  --   --  100  --   --  103  CO2 22  --   --  21  --   --   --   BUN 39*  --   --  23  --   --  24*  CREATININE 1.22  --   --  0.72  --  1.06 1.10  CALCIUM 9.4  --   --  8.0*  --   --   --   MG  --   --   --  2.1  --  3.4*  --   GLUCOSE 137*  < >  --  123* 123*  --  152*  < > = values in this interval not displayed.  CBG (last 3)   Recent Labs  01/24/14 0807 01/24/14 0958 01/24/14 1124  GLUCAP 107* 150* 160*    Scheduled Meds: . [START ON 01/25/2014] acetaminophen  1,000 mg Oral 4 times per day   Or  . [START ON 01/25/2014] acetaminophen (TYLENOL) oral liquid 160 mg/5 mL  1,000 mg Per Tube 4 times per day  . antiseptic oral rinse  15 mL Mouth Rinse q12n4p  . [START ON 01/25/2014] aspirin EC  325 mg Oral Daily   Or  . [START ON 01/25/2014] aspirin  324 mg Per Tube Daily  . [START ON 01/25/2014] bisacodyl  10 mg Oral Daily   Or  . [START ON 01/25/2014] bisacodyl  10 mg Rectal Daily  . chlorhexidine  15 mL Mouth Rinse BID  . [START ON 01/25/2014] docusate sodium  200 mg Oral Daily  . famotidine (PEPCID) IV  20 mg Intravenous Q12H  . insulin aspart  0-24 Units Subcutaneous 6 times per day  .  levofloxacin (LEVAQUIN) IV  750 mg Intravenous Q24H  . levothyroxine  50 mcg Oral QAC breakfast  . metoprolol tartrate  12.5 mg Oral BID   Or  . metoprolol tartrate  12.5 mg Per Tube BID  . [START ON 01/26/2014] pantoprazole  40 mg Oral Daily  . sodium chloride  10-40 mL Intracatheter Q12H  . [START ON 01/25/2014] sodium chloride  3 mL Intravenous Q12H  . vancomycin  1,000 mg Intravenous Q12H    Continuous Infusions: . sodium chloride Stopped (01/24/14 0330)  . sodium chloride    . dexmedetomidine Stopped (01/24/14 0800)  . lactated ringers 20 mL/hr at 01/24/14 1300  . milrinone 0.3 mcg/kg/min (01/24/14 1500)  . nitroGLYCERIN Stopped (01/24/14 0330)  . phenylephrine (NEO-SYNEPHRINE) Adult infusion 10 mcg/min (01/24/14 1500)    Past Medical History  Diagnosis Date  . Hypertension   . Unspecified hypothyroidism   . Mitral valve prolapse     h/o; normal echo 12/2011 with no MVP seen  . Foot drop, right   . BPH (benign prostatic hypertrophy)   . Iron deficiency anemia, unspecified 6/09  . Hearing loss in left ear   . Diverticulosis of colon 12/09  . Internal hemorrhoids   . GERD (gastroesophageal reflux disease)   . Allergic rhinitis, cause unspecified     on allergy shots (Dr. Orvil Feil)  . Allergic conjunctivitis   . Pulmonary embolus 01/23/2014  . Ruptured thoracic aortic aneurysm 01/23/2014    Past Surgical History  Procedure Laterality Date  . Prostate surgery  2007    photovaporization  . Cervical laminectomy  1972    C5-6  . Spine surgery  2006    L4-5 disk surgery  . Rotator cuff repair  10/2008    left; Dr. Onnie Graham  . Esophagogastroduodenoscopy  10/31/08    normal; Dr. Edison Nasuti  . Tonsillectomy    . Hemorroidal banding  04/07/2013    x3-Dr.Eric Redmond Pulling  . Hemorrhoidectomy with hemorrhoid banding  04/07/13  . Spine surgery  04/2013    L4-5, L5-S1 fusion.  Dr. Judson Roch, RD, LDN Pager #: [phone] After-Hours Pager #: [phone]

## 2014-01-24 NOTE — Transfer of Care (Signed)
Immediate Anesthesia Transfer of Care Note  Patient: Danny Frederick  Procedure(s) Performed: Procedure(s): THORACIC ASCENDING ANEURYSM REPAIR (AAA) (N/A)  Patient Location: SICU  Anesthesia Type:General  Level of Consciousness: Patient remains intubated per anesthesia plan  Airway & Oxygen Therapy: Patient remains intubated per anesthesia plan and Patient placed on Ventilator (see vital sign flow sheet for setting)  Post-op Assessment: Report given to PACU RN and Post -op Vital signs reviewed and stable  Post vital signs: Reviewed and stable  Complications: No apparent anesthesia complications

## 2014-01-24 NOTE — Progress Notes (Signed)
Patient ID: Danny Frederick, male   DOB: Mar 10, 1934, 80 y.o.   MRN: [id number] S/p repair of ruptured ascending aortic aneurysm  Intubated, but waking up and following commands  BP 101/60  Pulse 79  Temp(Src) 98.4 F (36.9 C) (Oral)  Resp 0  Wt 154 lb 15.7 oz (70.3 kg)  SpO2 99%   Intake/Output Summary (Last 24 hours) at 01/24/14 1813 Last data filed at 01/24/14 1800  Gross per 24 hour  Intake 10916.83 ml  Output   5645 ml  Net 5271.83 ml    UO ~ 50 ml/hr, CT minimal  Hct 29  Doing well  ABG OK, check parameters prior to extubation

## 2014-01-24 NOTE — Progress Notes (Signed)
Utilization Review Completed.  

## 2014-01-25 ENCOUNTER — Encounter (HOSPITAL_COMMUNITY): Payer: Self-pay | Admitting: Thoracic Surgery (Cardiothoracic Vascular Surgery)

## 2014-01-25 ENCOUNTER — Inpatient Hospital Stay (HOSPITAL_COMMUNITY): Payer: Medicare Other

## 2014-01-25 DIAGNOSIS — A491 Streptococcal infection, unspecified site: Secondary | ICD-10-CM

## 2014-01-25 DIAGNOSIS — R7881 Bacteremia: Secondary | ICD-10-CM

## 2014-01-25 DIAGNOSIS — I38 Endocarditis, valve unspecified: Secondary | ICD-10-CM

## 2014-01-25 DIAGNOSIS — I776 Arteritis, unspecified: Secondary | ICD-10-CM

## 2014-01-25 DIAGNOSIS — I2699 Other pulmonary embolism without acute cor pulmonale: Secondary | ICD-10-CM

## 2014-01-25 LAB — CBC
HCT: 27.1 % — ABNORMAL LOW (ref 39.0–52.0)
HEMATOCRIT: 30.1 % — AB (ref 39.0–52.0)
HEMOGLOBIN: 9.7 g/dL — AB (ref 13.0–17.0)
Hemoglobin: 10.5 g/dL — ABNORMAL LOW (ref 13.0–17.0)
MCH: 29 pg (ref 26.0–34.0)
MCH: 28.4 pg (ref 26.0–34.0)
MCHC: 35.8 g/dL (ref 30.0–36.0)
MCHC: 34.9 g/dL (ref 30.0–36.0)
MCV: 80.9 fL (ref 78.0–100.0)
MCV: 81.4 fL (ref 78.0–100.0)
PLATELETS: 211 10*3/uL (ref 150–400)
Platelets: 158 10*3/uL (ref 150–400)
RBC: 3.35 MIL/uL — ABNORMAL LOW (ref 4.22–5.81)
RBC: 3.7 MIL/uL — ABNORMAL LOW (ref 4.22–5.81)
RDW: 14.6 % (ref 11.5–15.5)
RDW: 14.8 % (ref 11.5–15.5)
WBC: 20.9 10*3/uL — AB (ref 4.0–10.5)
WBC: 22.4 10*3/uL — AB (ref 4.0–10.5)

## 2014-01-25 LAB — GLUCOSE, CAPILLARY
GLUCOSE-CAPILLARY: 171 mg/dL — AB (ref 70–99)
GLUCOSE-CAPILLARY: 151 mg/dL — AB (ref 70–99)
Glucose-Capillary: 118 mg/dL — ABNORMAL HIGH (ref 70–99)
Glucose-Capillary: 113 mg/dL — ABNORMAL HIGH (ref 70–99)
Glucose-Capillary: 114 mg/dL — ABNORMAL HIGH (ref 70–99)

## 2014-01-25 LAB — BASIC METABOLIC PANEL
BUN: 29 mg/dL — AB (ref 6–23)
CHLORIDE: 101 meq/L (ref 96–112)
CO2: 23 mEq/L (ref 19–32)
CREATININE: 1.11 mg/dL (ref 0.50–1.35)
Calcium: 8.6 mg/dL (ref 8.4–10.5)
GFR calc Af Amer: 71 mL/min — ABNORMAL LOW (ref >90)
GFR calc non Af Amer: 62 mL/min — ABNORMAL LOW (ref >90)
GLUCOSE: 127 mg/dL — AB (ref 70–99)
Potassium: 4.3 mEq/L (ref 3.7–5.3)
Sodium: 136 mEq/L — ABNORMAL LOW (ref 137–147)

## 2014-01-25 LAB — POCT I-STAT, CHEM 8
BUN: 26 mg/dL — ABNORMAL HIGH (ref 6–23)
Calcium, Ion: 1.24 mmol/L (ref 1.13–1.30)
Chloride: 102 mEq/L (ref 96–112)
Creatinine, Ser: 1.3 mg/dL (ref 0.50–1.35)
Glucose, Bld: 137 mg/dL — ABNORMAL HIGH (ref 70–99)
HCT: 31 % — ABNORMAL LOW (ref 39.0–52.0)
HEMOGLOBIN: 10.5 g/dL — AB (ref 13.0–17.0)
POTASSIUM: 4.4 meq/L (ref 3.7–5.3)
SODIUM: 135 meq/L — AB (ref 137–147)
TCO2: 22 mmol/L (ref 0–100)

## 2014-01-25 LAB — MAGNESIUM: MAGNESIUM: 2.8 mg/dL — AB (ref 1.5–2.5)

## 2014-01-25 LAB — CREATININE, SERUM
Creatinine, Ser: 1.09 mg/dL (ref 0.50–1.35)
GFR calc Af Amer: 73 mL/min — ABNORMAL LOW (ref >90)
GFR calc non Af Amer: 63 mL/min — ABNORMAL LOW (ref >90)

## 2014-01-25 MED ORDER — LABETALOL HCL 5 MG/ML IV SOLN
10.0000 mg | INTRAVENOUS | Status: DC | PRN
  Administered 2014-01-25 – 2014-01-30 (×3): 10 mg via INTRAVENOUS
  Filled 2014-01-25 (×5): qty 4

## 2014-01-25 MED ORDER — FUROSEMIDE 10 MG/ML IJ SOLN
10.0000 mg/h | INTRAVENOUS | Status: DC
  Administered 2014-01-25 – 2014-01-26 (×2): 10 mg/h via INTRAVENOUS
  Filled 2014-01-25 (×4): qty 25

## 2014-01-25 MED ORDER — FUROSEMIDE 10 MG/ML IJ SOLN
20.0000 mg | Freq: Four times a day (QID) | INTRAMUSCULAR | Status: DC
  Administered 2014-01-25 (×2): 20 mg via INTRAVENOUS
  Filled 2014-01-25 (×2): qty 2

## 2014-01-25 MED ORDER — AMLODIPINE BESYLATE 2.5 MG PO TABS
2.5000 mg | ORAL_TABLET | Freq: Every day | ORAL | Status: DC
  Administered 2014-01-25: 2.5 mg via ORAL
  Filled 2014-01-25 (×2): qty 1

## 2014-01-25 MED ORDER — INSULIN ASPART 100 UNIT/ML ~~LOC~~ SOLN
0.0000 [IU] | SUBCUTANEOUS | Status: DC
  Administered 2014-01-26: 2 [IU] via SUBCUTANEOUS

## 2014-01-25 MED ORDER — DEXTROSE 5 % IV SOLN
2.0000 g | INTRAVENOUS | Status: DC
  Administered 2014-01-25 – 2014-02-04 (×11): 2 g via INTRAVENOUS
  Filled 2014-01-25 (×13): qty 2

## 2014-01-25 MED ORDER — WARFARIN SODIUM 2.5 MG PO TABS
2.5000 mg | ORAL_TABLET | Freq: Every day | ORAL | Status: DC
  Administered 2014-01-25 – 2014-01-26 (×2): 2.5 mg via ORAL
  Filled 2014-01-25 (×3): qty 1

## 2014-01-25 MED ORDER — METOPROLOL TARTRATE 25 MG PO TABS
25.0000 mg | ORAL_TABLET | Freq: Two times a day (BID) | ORAL | Status: DC
  Administered 2014-01-25 – 2014-02-01 (×14): 25 mg via ORAL
  Filled 2014-01-25 (×19): qty 1

## 2014-01-25 MED ORDER — ASPIRIN EC 81 MG PO TBEC
81.0000 mg | DELAYED_RELEASE_TABLET | Freq: Every day | ORAL | Status: DC
  Administered 2014-01-25 – 2014-02-04 (×11): 81 mg via ORAL
  Filled 2014-01-25 (×11): qty 1

## 2014-01-25 MED ORDER — HYPROMELLOSE 0.3 % OP GEL: 1.0000 "application " | OPHTHALMIC | Status: DC | PRN

## 2014-01-25 MED ORDER — RIFAMPIN 300 MG PO CAPS
300.0000 mg | ORAL_CAPSULE | Freq: Every day | ORAL | Status: DC
  Administered 2014-01-25 – 2014-02-04 (×11): 300 mg via ORAL
  Filled 2014-01-25 (×12): qty 1

## 2014-01-25 MED ORDER — MILRINONE IN DEXTROSE 20 MG/100ML IV SOLN: 0.0000 ug/kg/min | INTRAVENOUS | Status: DC

## 2014-01-25 MED ORDER — WARFARIN - PHYSICIAN DOSING INPATIENT
Freq: Every day | Status: DC
  Administered 2014-01-25 – 2014-01-26 (×2)

## 2014-01-25 MED FILL — Mannitol IV Soln 20%: INTRAVENOUS | Qty: 500 | Status: AC

## 2014-01-25 MED FILL — Electrolyte-R (PH 7.4) Solution: INTRAVENOUS | Qty: 7000 | Status: AC

## 2014-01-25 MED FILL — Potassium Chloride Inj 2 mEq/ML: INTRAVENOUS | Qty: 40 | Status: AC

## 2014-01-25 MED FILL — Calcium Chloride Inj 10%: INTRAVENOUS | Qty: 10 | Status: AC

## 2014-01-25 MED FILL — Albumin, Human Inj 5%: INTRAVENOUS | Qty: 250 | Status: AC

## 2014-01-25 MED FILL — Sodium Chloride IV Soln 0.9%: INTRAVENOUS | Qty: 3000 | Status: AC

## 2014-01-25 MED FILL — Sodium Bicarbonate IV Soln 8.4%: INTRAVENOUS | Qty: 150 | Status: AC

## 2014-01-25 MED FILL — Magnesium Sulfate Inj 50%: INTRAMUSCULAR | Qty: 10 | Status: AC

## 2014-01-25 MED FILL — Heparin Sodium (Porcine) Inj 1000 Unit/ML: INTRAMUSCULAR | Qty: 90 | Status: AC

## 2014-01-25 MED FILL — Lidocaine HCl IV Inj 20 MG/ML: INTRAVENOUS | Qty: 5 | Status: AC

## 2014-01-25 MED FILL — Heparin Sodium (Porcine) Inj 1000 Unit/ML: INTRAMUSCULAR | Qty: 30 | Status: AC

## 2014-01-25 NOTE — Consult Note (Signed)
[facility]  Date of Admission:  01/23/2014  Date of Consult:  01/25/2014  Reason for Consult: Streptococcal Bacteremia Referring Physician: Dr Roxy Manns  Impression/Recommendation Streptococcal Bacteremia Endocarditis/Aortitis  Will change his anbx to ceftriaxone and rifampin Repeat his BCx Consider imaging his spine at previous surgery site  Comment-  With presence of prosthetic material will add rifampin, which is food for protecting prosthetic material from slowly growing organisms.   Thank you so much for this interesting consult,   Campbell Riches (pager) [phone] [email or web address]  Ennis Heavner is an 80 y.o. male.  HPI: 80 yo M with hx Lumbar fusion with autograft and prosthesis 04-29-13, and of weakness and fatigue since January. He also developed substernal chest pain. He had outpt was well as ED eval without a specific dx found. He began to develop DOE over last 2 weeks and came to  ED on 5-25. Temp 100.5 and WBC 26.1. His CXR showed a widened mediastinum and CT showed ascending aortic arch aneurysm, rupture. He was taken to OR on 5-25 and was found to have: Contained rupture of ascending thoracic aorta, presumably secondary to penetrating atherosclerotic ulcer. Large pseudoaneurysm with dense surrounding inflammation, compressing the main pulmonary artery. Severely inflamed and friable tissue. Perforation of LV apex requiring suture repair. He had aortic graft placed in OR.  He was started on vanco/levaquin. His BCx are now growing strep.  No fever or chills at home.   Past Medical History  Diagnosis Date  . Hypertension   . Unspecified hypothyroidism   . Mitral valve prolapse     h/o; normal echo 12/2011 with no MVP seen  . Foot drop, right   . BPH (benign prostatic hypertrophy)   . Iron deficiency anemia, unspecified 6/09  . Hearing loss in left ear   . Diverticulosis of colon 12/09  . Internal hemorrhoids   . GERD  (gastroesophageal reflux disease)   . Allergic rhinitis, cause unspecified     on allergy shots (Dr. Orvil Feil)  . Allergic conjunctivitis   . Ruptured thoracic aortic aneurysm 01/23/2014  . 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 Right ventricular dysfunction 01/23/2014    Secondary to obstruction of main pulmonary artery    Past Surgical History  Procedure Laterality Date  . Prostate surgery  2007    photovaporization  . Cervical laminectomy  1972    C5-6  . Spine surgery  2006    L4-5 disk surgery  . Rotator cuff repair  10/2008    left; Dr. Onnie Graham  . Esophagogastroduodenoscopy  10/31/08    normal; Dr. Edison Nasuti  . Tonsillectomy    . Hemorroidal banding  04/07/2013    x3-Dr.Eric Redmond Pulling  . Hemorrhoidectomy with hemorrhoid banding  04/07/13  . Spine surgery  04/2013    L4-5, L5-S1 fusion.  Dr. Christella Noa     No Known Allergies  Medications:  Scheduled: . acetaminophen  1,000 mg Oral 4 times per day  . amLODipine  2.5 mg Oral Daily  . antiseptic oral rinse  15 mL Mouth Rinse q12n4p  . aspirin EC  81 mg Oral Daily  . bisacodyl  10 mg Oral Daily   Or  . bisacodyl  10 mg Rectal Daily  . chlorhexidine  15 mL Mouth Rinse BID  . docusate sodium  200 mg Oral Daily  . furosemide  20 mg Intravenous Q6H  . insulin aspart  0-24 Units Subcutaneous 6 times per day  . insulin aspart  0-24 Units Subcutaneous 6  times per day  . levofloxacin (LEVAQUIN) IV  750 mg Intravenous Q24H  . levothyroxine  50 mcg Oral QAC breakfast  . metoprolol tartrate  25 mg Oral BID  . [START ON 01/26/2014] pantoprazole  40 mg Oral Daily  . sodium chloride  10-40 mL Intracatheter Q12H  . sodium chloride  3 mL Intravenous Q12H  . vancomycin  1,000 mg Intravenous Q12H  . warfarin  2.5 mg Oral q1800  . Warfarin - Physician Dosing Inpatient   Does not apply q1800    Abtx:  Anti-infectives   Start     Dose/Rate Route Frequency Ordered Stop   01/24/14 1700  levofloxacin (LEVAQUIN) IVPB 750 mg     750 mg 100 mL/hr  over 90 Minutes Intravenous Every 24 hours 01/24/14 1033     01/24/14 1100  vancomycin (VANCOCIN) IVPB 1000 mg/200 mL premix  Status:  Discontinued     1,000 mg 200 mL/hr over 60 Minutes Intravenous  Once 01/24/14 0306 01/24/14 0934   01/24/14 1045  vancomycin (VANCOCIN) IVPB 1000 mg/200 mL premix     1,000 mg 200 mL/hr over 60 Minutes Intravenous Every 12 hours 01/24/14 1033     01/24/14 0700  cefUROXime (ZINACEF) 1.5 g in dextrose 5 % 50 mL IVPB  Status:  Discontinued     1.5 g 100 mL/hr over 30 Minutes Intravenous Every 12 hours 01/24/14 0306 01/24/14 0934   01/23/14 2056  polymyxin B 500,000 Units, bacitracin 50,000 Units in sodium chloride irrigation 0.9 % 500 mL irrigation  Status:  Discontinued       As needed 01/23/14 2057 01/24/14 0306   01/23/14 1845  vancomycin (VANCOCIN) 1,250 mg in sodium chloride 0.9 % 250 mL IVPB     1,250 mg 166.7 mL/hr over 90 Minutes Intravenous To Surgery 01/23/14 1843 01/23/14 1936   01/23/14 1845  cefUROXime (ZINACEF) 1.5 g in dextrose 5 % 50 mL IVPB     1.5 g 100 mL/hr over 30 Minutes Intravenous To Surgery 01/23/14 1843 01/24/14 0035   01/23/14 1845  vancomycin (VANCOCIN) 1,000 mg in sodium chloride 0.9 % 1,000 mL irrigation      Irrigation To Surgery 01/23/14 1836 01/23/14 2022   01/23/14 1830  cefUROXime (ZINACEF) 750 mg in dextrose 5 % 50 mL IVPB  Status:  Discontinued     750 mg 100 mL/hr over 30 Minutes Intravenous To Surgery 01/23/14 1836 01/24/14 0306   01/23/14 1645  levofloxacin (LEVAQUIN) IVPB 750 mg     750 mg 100 mL/hr over 90 Minutes Intravenous  Once 01/23/14 1633 01/23/14 1822      Total days of antibiotics 2 (vanco/levaqun)          Social History:  reports that he quit smoking about 36 years ago. His smoking use included Cigarettes. He smoked 0.00 packs per day. He has never used smokeless tobacco. He reports that he drinks alcohol. He reports that he does not use illicit drugs.  Family History  Problem Relation Age of Onset    . Diabetes Mother   . Heart disease Mother     CHF  . Stroke Father   . Parkinsonism Father   . Cancer Brother     bladder cancer  . Cancer Brother     prostate cancer  . Emphysema Brother   . Diabetes Sister   . Colon cancer Neg Hx      General ROS: states he had allergies prior to arrival- sore throat, nasal d/c. no f/c. no recent  suregeries, wounds, injuries ro dental work He did have Lumbar fusion in August 2014. see HPI.   Blood pressure 106/71, pulse 80, temperature 97.4 F (36.3 C), temperature source Oral, resp. rate 22, height _0  (1.6 m), weight 69.4 kg (153 lb), SpO2 92.00%. General appearance: alert, cooperative and no distress Eyes: negative findings: conjunctivae and sclerae normal and pupils equal, round, reactive to light and accomodation Throat: normal findings: oropharynx pink & moist without lesions or evidence of thrush and abnormal findings: erythema L tonsil Neck: no adenopathy, supple, symmetrical, trachea midline and R IJ Lungs: diminished breath sounds bilaterally Chest wall: midline sternal wound clean.  Heart: regular rate and rhythm Abdomen: normal findings: bowel sounds normal and soft, non-tender Extremities: edema none   Results for orders placed during the hospital encounter of 01/23/14 (from the past 48 hour(s))  CBC WITH DIFFERENTIAL     Status: Abnormal   Collection Time    01/23/14  4:15 PM      Result Value Ref Range   WBC 26.1 (*) 4.0 - 10.5 K/uL   RBC 4.65  4.22 - 5.81 MIL/uL   Hemoglobin 12.4 (*) 13.0 - 17.0 g/dL   HCT 36.2 (*) 39.0 - 52.0 %   MCV 77.8 (*) 78.0 - 100.0 fL   MCH 26.7  26.0 - 34.0 pg   MCHC 34.3  30.0 - 36.0 g/dL   RDW 13.5  11.5 - 15.5 %   Platelets 587 (*) 150 - 400 K/uL   Neutrophils Relative % 87 (*) 43 - 77 %   Lymphocytes Relative 7 (*) 12 - 46 %   Monocytes Relative 6  3 - 12 %   Eosinophils Relative 0  0 - 5 %   Basophils Relative 0  0 - 1 %   Neutro Abs 22.7 (*) 1.7 - 7.7 K/uL   Lymphs Abs 1.8  0.7 -  4.0 K/uL   Monocytes Absolute 1.6 (*) 0.1 - 1.0 K/uL   Eosinophils Absolute 0.0  0.0 - 0.7 K/uL   Basophils Absolute 0.0  0.0 - 0.1 K/uL   Smear Review MORPHOLOGY UNREMARKABLE    COMPREHENSIVE METABOLIC PANEL     Status: Abnormal   Collection Time    01/23/14  4:15 PM      Result Value Ref Range   Sodium 131 (*) 137 - 147 mEq/L   Potassium 4.4  3.7 - 5.3 mEq/L   Chloride 89 (*) 96 - 112 mEq/L   CO2 22  19 - 32 mEq/L   Glucose, Bld 137 (*) 70 - 99 mg/dL   BUN 39 (*) 6 - 23 mg/dL   Creatinine, Ser 1.22  0.50 - 1.35 mg/dL   Calcium 9.4  8.4 - 10.5 mg/dL   Total Protein 7.5  6.0 - 8.3 g/dL   Albumin 2.3 (*) 3.5 - 5.2 g/dL   AST 40 (*) 0 - 37 U/L   ALT 42  0 - 53 U/L   Alkaline Phosphatase 265 (*) 39 - 117 U/L   Total Bilirubin 0.3  0.3 - 1.2 mg/dL   GFR calc non Af Amer 55 (*) >90 mL/min   GFR calc Af Amer 64 (*) >90 mL/min   Comment: (NOTE)     The eGFR has been calculated using the CKD EPI equation.     This calculation has not been validated in all clinical situations.     eGFR's persistently <90 mL/min signify possible Chronic Kidney     Disease.  URINE CULTURE  Status: None   Collection Time    01/23/14  4:19 PM      Result Value Ref Range   Specimen Description URINE, CLEAN CATCH     Special Requests NONE     Culture  Setup Time       Value: 01/24/2014 01:43     Performed at [place]       Value: NO GROWTH     Performed at [facility]   Culture       Value: NO GROWTH     Performed at [facility]   Report Status 01/24/2014 FINAL    URINALYSIS, ROUTINE W REFLEX MICROSCOPIC     Status: Abnormal   Collection Time    01/23/14  4:19 PM      Result Value Ref Range   Color, Urine AMBER (*) YELLOW   Comment: BIOCHEMICALS MAY BE AFFECTED BY COLOR   APPearance CLOUDY (*) CLEAR   Specific Gravity, Urine 1.025  1.005 - 1.030   pH 5.0  5.0 - 8.0   Glucose, UA NEGATIVE  NEGATIVE mg/dL   Hgb urine dipstick NEGATIVE  NEGATIVE    Bilirubin Urine SMALL (*) NEGATIVE   Ketones, ur NEGATIVE  NEGATIVE mg/dL   Protein, ur NEGATIVE  NEGATIVE mg/dL   Urobilinogen, UA 1.0  0.0 - 1.0 mg/dL   Nitrite NEGATIVE  NEGATIVE   Leukocytes, UA NEGATIVE  NEGATIVE   Comment: MICROSCOPIC NOT DONE ON URINES WITH NEGATIVE PROTEIN, BLOOD, LEUKOCYTES, NITRITE, OR GLUCOSE <1000 mg/dL.  CULTURE, BLOOD (ROUTINE X 2)     Status: None   Collection Time    01/23/14  4:45 PM      Result Value Ref Range   Specimen Description BLOOD BLOOD RIGHT FOREARM     Special Requests BOTTLES DRAWN AEROBIC AND ANAEROBIC 4ML     Culture  Setup Time       Value: 01/23/2014 21:33     Performed at [facility]   Culture       Value: STREPTOCOCCUS SPECIES     Note: Gram Stain Report Called to,Read Back By and Verified With: Lillia Dallas RN 01/24/14 11:20AM BY [place]     Performed at [facility]   Report Status PENDING    CULTURE, BLOOD (ROUTINE X 2)     Status: None   Collection Time    01/23/14  4:47 PM      Result Value Ref Range   Specimen Description BLOOD RIGHT ANTECUBITAL     Special Requests BOTTLES DRAWN AEROBIC AND ANAEROBIC 4ML     Culture  Setup Time       Value: 01/23/2014 21:33     Performed at [facility]   Culture       Value: STREPTOCOCCUS SPECIES     Note: Gram Stain Report Called to,Read Back By and Verified With: Lillia Dallas RN 01/23/14 11:20AM BY [place]     Performed at [place], ED     Status: Abnormal   Collection Time    01/23/14  4:53 PM      Result Value Ref Range   Troponin i, poc 0.11 (*) 0.00 - 0.08 ng/mL   Comment NOTIFIED PHYSICIAN     Comment 3            Comment: Due to the release kinetics of cTnI,     a negative result within  the first hours     of the onset of symptoms does not rule out     myocardial infarction with certainty.     If myocardial infarction is still suspected,     repeat the test at appropriate intervals.    I-STAT CG4 LACTIC ACID, ED     Status: Abnormal   Collection Time    01/23/14  4:56 PM      Result Value Ref Range   Lactic Acid, Venous 2.33 (*) 0.5 - 2.2 mmol/L  APTT     Status: None   Collection Time    01/23/14  6:44 PM      Result Value Ref Range   aPTT 32  24 - 37 seconds  PROTIME-INR     Status: None   Collection Time    01/23/14  6:44 PM      Result Value Ref Range   Prothrombin Time 14.8  11.6 - 15.2 seconds   INR 1.19  0.00 - 1.49  TYPE AND SCREEN     Status: None   Collection Time    01/23/14  6:44 PM      Result Value Ref Range   ABO/RH(D) B POS     Antibody Screen NEG     Sample Expiration 01/26/2014     Unit Number [id number]     Blood Component Type RED CELLS,LR     Unit division 00     Status of Unit REL FROM [facility]     Unit tag comment VERBAL ORDERS PER DR [name]     Transfusion Status OK TO TRANSFUSE     Crossmatch Result NOT NEEDED     Unit Number [id number]     Blood Component Type RED CELLS,LR     Unit division 00     Status of Unit REL FROM [facility]     Unit tag comment VERBAL ORDERS PER DR [name]     Transfusion Status OK TO TRANSFUSE     Crossmatch Result NOT NEEDED     Unit Number [id number]     Blood Component Type RBC LR PHER1     Unit division 00     Status of Unit REL FROM [facility]     Unit tag comment VERBAL ORDERS PER DR [name]     Transfusion Status OK TO TRANSFUSE     Crossmatch Result NOT NEEDED     Unit Number [id number]     Blood Component Type RED CELLS,LR     Unit division 00     Status of Unit REL FROM [facility]     Unit tag comment VERBAL ORDERS PER DR [name]     Transfusion Status OK TO TRANSFUSE     Crossmatch Result NOT NEEDED     Unit Number [id number]     Blood Component Type RED CELLS,LR     Unit division 00     Status of Unit ISSUED,FINAL     Transfusion Status OK TO TRANSFUSE     Crossmatch Result Compatible     Unit Number [id number]     Blood Component Type RED CELLS,LR     Unit division 00     Status of  Unit ISSUED,FINAL     Transfusion Status OK TO TRANSFUSE     Crossmatch Result Compatible     Unit Number [id number]     Blood Component Type RED CELLS,LR     Unit division 00     Status of Unit ISSUED,FINAL  Transfusion Status OK TO TRANSFUSE     Crossmatch Result Compatible     Unit Number [id number]     Blood Component Type RED CELLS,LR     Unit division 00     Status of Unit ISSUED,FINAL     Transfusion Status OK TO TRANSFUSE     Crossmatch Result Compatible     Unit Number [id number]     Blood Component Type RED CELLS,LR     Unit division 00     Status of Unit ISSUED,FINAL     Transfusion Status OK TO TRANSFUSE     Crossmatch Result Compatible     Unit Number [id number]     Blood Component Type RED CELLS,LR     Unit division 00     Status of Unit ISSUED,FINAL     Transfusion Status OK TO TRANSFUSE     Crossmatch Result Compatible     Unit Number [id number]     Blood Component Type RED CELLS,LR     Unit division 00     Status of Unit ISSUED,FINAL     Transfusion Status OK TO TRANSFUSE     Crossmatch Result Compatible     Unit Number [id number]     Blood Component Type RED CELLS,LR     Unit division 00     Status of Unit ISSUED,FINAL     Transfusion Status OK TO TRANSFUSE     Crossmatch Result Compatible     Unit Number [id number]     Blood Component Type RED CELLS,LR     Unit division 00     Status of Unit ALLOCATED     Transfusion Status OK TO TRANSFUSE     Crossmatch Result Compatible     Unit Number [id number]     Blood Component Type RED CELLS,LR     Unit division 00     Status of Unit ALLOCATED     Transfusion Status OK TO TRANSFUSE     Crossmatch Result Compatible     Unit Number [id number]     Blood Component Type RED CELLS,LR     Unit division 00     Status of Unit ALLOCATED     Transfusion Status OK TO TRANSFUSE     Crossmatch Result Compatible     Unit Number [id number]     Blood Component Type RED CELLS,LR      Unit division 00     Status of Unit ALLOCATED     Transfusion Status OK TO TRANSFUSE     Crossmatch Result Compatible     Unit Number [id number]     Blood Component Type RED CELLS,LR     Unit division 00     Status of Unit REL FROM [facility]     Transfusion Status OK TO TRANSFUSE     Crossmatch Result Compatible     Unit Number [id number]     Blood Component Type RED CELLS,LR     Unit division 00     Status of Unit REL FROM [facility]     Transfusion Status OK TO TRANSFUSE     Crossmatch Result Compatible     Unit Number [id number]     Blood Component Type RED CELLS,LR     Unit division 00     Status of Unit REL FROM [facility]     Transfusion Status OK TO TRANSFUSE     Crossmatch Result Compatible     Unit Number [id number]     Blood Component Type RED CELLS,LR  Unit division 00     Status of Unit REL FROM [facility]     Transfusion Status OK TO TRANSFUSE     Crossmatch Result Compatible    PREPARE RBC (CROSSMATCH)     Status: None   Collection Time    01/23/14  7:30 PM      Result Value Ref Range   Order Confirmation ORDER PROCESSED BY BLOOD BANK    POCT I-STAT 4, (NA,K, GLUC, HGB,HCT)     Status: Abnormal   Collection Time    01/23/14  8:04 PM      Result Value Ref Range   Sodium 128 (*) 137 - 147 mEq/L   Potassium 4.3  3.7 - 5.3 mEq/L   Glucose, Bld 141 (*) 70 - 99 mg/dL   HCT 30.0 (*) 39.0 - 52.0 %   Hemoglobin 10.2 (*) 13.0 - 17.0 g/dL  GRAM STAIN     Status: None   Collection Time    01/23/14  8:43 PM      Result Value Ref Range   Specimen Description FLUID     Special Requests MEDIASTINAL FLUID     Gram Stain       Value: RARE WBC PRESENT,BOTH PMN AND MONONUCLEAR     NO ORGANISMS SEEN     CALLED TO C.YATES,RN 2036 01/23/14   Report Status 01/23/2014 FINAL    BODY FLUID CULTURE     Status: None   Collection Time    01/23/14  8:43 PM      Result Value Ref Range   Specimen Description FLUID     Special Requests MEDIASTINAL FLUID     Gram Stain       Value:  RARE WBC PRESENT,BOTH PMN AND MONONUCLEAR     NO ORGANISMS SEEN     Gram Stain Report Called to,Read Back By and Verified With: Gram Stain Report Called to,Read Back By and Verified With: Jule Economy RN 2036 01/23/14 Performed at [facility]     Performed at [facility]   Culture       Value: FEW STREPTOCOCCUS SPECIES     Note: CRITICAL RESULT CALLED TO, READ BACK BY AND VERIFIED WITH: NIKKI [email or web address]  ON [id number] BY [facility]     Performed at [facility]   Report Status PENDING    POCT I-STAT 4, (NA,K, GLUC, HGB,HCT)     Status: Abnormal   Collection Time    01/23/14  8:50 PM      Result Value Ref Range   Sodium 128 (*) 137 - 147 mEq/L   Potassium 4.2  3.7 - 5.3 mEq/L   Glucose, Bld 133 (*) 70 - 99 mg/dL   HCT 28.0 (*) 39.0 - 52.0 %   Hemoglobin 9.5 (*) 13.0 - 17.0 g/dL  POCT I-STAT 4, (NA,K, GLUC, HGB,HCT)     Status: Abnormal   Collection Time    01/23/14  9:31 PM      Result Value Ref Range   Sodium 127 (*) 137 - 147 mEq/L   Potassium 3.8  3.7 - 5.3 mEq/L   Glucose, Bld 129 (*) 70 - 99 mg/dL   HCT 21.0 (*) 39.0 - 52.0 %   Hemoglobin 7.1 (*) 13.0 - 17.0 g/dL  POCT I-STAT 3, ART BLOOD GAS (G3+)     Status: Abnormal   Collection Time    01/23/14  9:35 PM      Result Value Ref Range   pH, Arterial 7.418  7.350 - 7.450  pCO2 arterial 35.1  35.0 - 45.0 mmHg   pO2, Arterial 295.0 (*) 80.0 - 100.0 mmHg   Bicarbonate 22.7  20.0 - 24.0 mEq/L   TCO2 24  0 - 100 mmol/L   O2 Saturation 100.0     Acid-base deficit 2.0  0.0 - 2.0 mmol/L   Sample type ARTERIAL    POCT I-STAT 4, (NA,K, GLUC, HGB,HCT)     Status: Abnormal   Collection Time    01/23/14 11:07 PM      Result Value Ref Range   Sodium 126 (*) 137 - 147 mEq/L   Potassium 7.0 (*) 3.7 - 5.3 mEq/L   Glucose, Bld 227 (*) 70 - 99 mg/dL   HCT 24.0 (*) 39.0 - 52.0 %   Hemoglobin 8.2 (*) 13.0 - 17.0 g/dL  HEMOGLOBIN AND HEMATOCRIT, BLOOD     Status: Abnormal   Collection Time    01/23/14 11:10 PM      Result  Value Ref Range   Hemoglobin 8.4 (*) 13.0 - 17.0 g/dL   HCT 24.8 (*) 39.0 - 52.0 %  PLATELET COUNT     Status: Abnormal   Collection Time    01/23/14 11:10 PM      Result Value Ref Range   Platelets 80 (*) 150 - 400 K/uL   Comment: SPECIMEN CHECKED FOR CLOTS     REPEATED TO VERIFY     PLATELET COUNT CONFIRMED BY SMEAR  FIBRINOGEN     Status: None   Collection Time    01/23/14 11:10 PM      Result Value Ref Range   Fibrinogen 371  204 - 475 mg/dL  POCT I-STAT 3, ART BLOOD GAS (G3+)     Status: Abnormal   Collection Time    01/23/14 11:12 PM      Result Value Ref Range   pH, Arterial 7.227 (*) 7.350 - 7.450   pCO2 arterial 43.8  35.0 - 45.0 mmHg   pO2, Arterial 374.0 (*) 80.0 - 100.0 mmHg   Bicarbonate 18.2 (*) 20.0 - 24.0 mEq/L   TCO2 20  0 - 100 mmol/L   O2 Saturation 100.0     Acid-base deficit 9.0 (*) 0.0 - 2.0 mmol/L   Sample type ARTERIAL    PREPARE PLATELET PHERESIS     Status: None   Collection Time    01/23/14 11:59 PM      Result Value Ref Range   Unit Number [id number]     Blood Component Type PLTPHER LR1     Unit division 00     Status of Unit ISSUED,FINAL     Transfusion Status OK TO TRANSFUSE     Unit Number [id number]     Blood Component Type PLTPHER LR2     Unit division 00     Status of Unit ISSUED,FINAL     Transfusion Status OK TO TRANSFUSE    PREPARE FRESH FROZEN PLASMA     Status: None   Collection Time    01/23/14 11:59 PM      Result Value Ref Range   Unit Number [id number]     Blood Component Type THAWED PLASMA     Unit division 00     Status of Unit ISSUED,FINAL     Transfusion Status OK TO TRANSFUSE     Unit Number [id number]     Blood Component Type THAWED PLASMA     Unit division 00     Status of Unit ISSUED,FINAL     Transfusion  Status OK TO TRANSFUSE     Unit Number [id number]     Blood Component Type THAWED PLASMA     Unit division 00     Status of Unit ISSUED,FINAL     Transfusion Status OK TO TRANSFUSE     Unit  Number [id number]     Blood Component Type THAWED PLASMA     Unit division 00     Status of Unit ISSUED,FINAL     Transfusion Status OK TO TRANSFUSE    POCT I-STAT GLUCOSE     Status: Abnormal   Collection Time    01/24/14 12:12 AM      Result Value Ref Range   Operator id [id number]     Glucose, Bld 119 (*) 70 - 99 mg/dL  POCT I-STAT 7, (LYTES, BLD GAS, ICA,H+H)     Status: Abnormal   Collection Time    01/24/14 12:40 AM      Result Value Ref Range   pH, Arterial 7.387  7.350 - 7.450   pCO2 arterial 36.9  35.0 - 45.0 mmHg   pO2, Arterial 329.0 (*) 80.0 - 100.0 mmHg   Bicarbonate 22.2  20.0 - 24.0 mEq/L   TCO2 23  0 - 100 mmol/L   O2 Saturation 100.0     Acid-base deficit 3.0 (*) 0.0 - 2.0 mmol/L   Sodium 132 (*) 137 - 147 mEq/L   Potassium 4.1  3.7 - 5.3 mEq/L   Calcium, Ion 0.99 (*) 1.13 - 1.30 mmol/L   HCT 20.0 (*) 39.0 - 52.0 %   Hemoglobin 6.8 (*) 13.0 - 17.0 g/dL   Sample type CARDIOPULMONARY BYPASS    POCT I-STAT GLUCOSE     Status: Abnormal   Collection Time    01/24/14 12:46 AM      Result Value Ref Range   Operator id [id number]     Glucose, Bld 107 (*) 70 - 99 mg/dL  FIBRINOGEN     Status: None   Collection Time    01/24/14  1:10 AM      Result Value Ref Range   Fibrinogen 307  204 - 475 mg/dL  APTT     Status: Abnormal   Collection Time    01/24/14  1:10 AM      Result Value Ref Range   aPTT 41 (*) 24 - 37 seconds   Comment:            IF BASELINE aPTT IS ELEVATED,     SUGGEST PATIENT RISK ASSESSMENT     BE USED TO DETERMINE APPROPRIATE     ANTICOAGULANT THERAPY.  PROTIME-INR     Status: Abnormal   Collection Time    01/24/14  1:10 AM      Result Value Ref Range   Prothrombin Time 19.2 (*) 11.6 - 15.2 seconds   INR 1.67 (*) 0.00 - 1.49  CBC     Status: Abnormal   Collection Time    01/24/14  1:10 AM      Result Value Ref Range   WBC 22.0 (*) 4.0 - 10.5 K/uL   RBC 3.04 (*) 4.22 - 5.81 MIL/uL   Hemoglobin 8.7 (*) 13.0 - 17.0 g/dL   Comment: DELTA CHECK  NOTED     POST TRANSFUSION SPECIMEN   HCT 25.0 (*) 39.0 - 52.0 %   MCV 82.2  78.0 - 100.0 fL   MCH 28.6  26.0 - 34.0 pg   MCHC 34.8  30.0 - 36.0 g/dL   RDW  15.3  11.5 - 15.5 %   Platelets 232  150 - 400 K/uL   Comment: DELTA CHECK NOTED     POST TRANSFUSION SPECIMEN  POCT I-STAT 7, (LYTES, BLD GAS, ICA,H+H)     Status: Abnormal   Collection Time    01/24/14  1:22 AM      Result Value Ref Range   pH, Arterial 7.350  7.350 - 7.450   pCO2 arterial 43.4  35.0 - 45.0 mmHg   pO2, Arterial 298.0 (*) 80.0 - 100.0 mmHg   Bicarbonate 24.2 (*) 20.0 - 24.0 mEq/L   TCO2 26  0 - 100 mmol/L   O2 Saturation 100.0     Acid-base deficit 2.0  0.0 - 2.0 mmol/L   Sodium 133 (*) 137 - 147 mEq/L   Potassium 4.7  3.7 - 5.3 mEq/L   Calcium, Ion 0.88 (*) 1.13 - 1.30 mmol/L   HCT 25.0 (*) 39.0 - 52.0 %   Hemoglobin 8.5 (*) 13.0 - 17.0 g/dL   Patient temperature 36.2 C     Sample type ARTERIAL    POCT I-STAT GLUCOSE     Status: Abnormal   Collection Time    01/24/14  2:24 AM      Result Value Ref Range   Operator id [id number]     Glucose, Bld 148 (*) 70 - 99 mg/dL  POCT I-STAT 7, (LYTES, BLD GAS, ICA,H+H)     Status: Abnormal   Collection Time    01/24/14  2:28 AM      Result Value Ref Range   pH, Arterial 7.378  7.350 - 7.450   pCO2 arterial 41.1  35.0 - 45.0 mmHg   pO2, Arterial 395.0 (*) 80.0 - 100.0 mmHg   Bicarbonate 24.5 (*) 20.0 - 24.0 mEq/L   TCO2 26  0 - 100 mmol/L   O2 Saturation 100.0     Acid-base deficit 1.0  0.0 - 2.0 mmol/L   Sodium 133 (*) 137 - 147 mEq/L   Potassium 4.7  3.7 - 5.3 mEq/L   Calcium, Ion 1.05 (*) 1.13 - 1.30 mmol/L   HCT 37.0 (*) 39.0 - 52.0 %   Hemoglobin 12.6 (*) 13.0 - 17.0 g/dL   Patient temperature 35.7 C     Sample type ARTERIAL    CBC     Status: Abnormal   Collection Time    01/24/14  3:30 AM      Result Value Ref Range   WBC 24.3 (*) 4.0 - 10.5 K/uL   RBC 5.06  4.22 - 5.81 MIL/uL   Hemoglobin 14.6  13.0 - 17.0 g/dL   HCT 41.3  39.0 - 52.0 %   MCV  81.6  78.0 - 100.0 fL   MCH 28.9  26.0 - 34.0 pg   MCHC 35.4  30.0 - 36.0 g/dL   RDW 14.2  11.5 - 15.5 %   Platelets 188  150 - 400 K/uL  PROTIME-INR     Status: None   Collection Time    01/24/14  3:30 AM      Result Value Ref Range   Prothrombin Time 12.7  11.6 - 15.2 seconds   INR 0.97  0.00 - 1.49  APTT     Status: Abnormal   Collection Time    01/24/14  3:30 AM      Result Value Ref Range   aPTT 38 (*) 24 - 37 seconds   Comment:  IF BASELINE aPTT IS ELEVATED,     SUGGEST PATIENT RISK ASSESSMENT     BE USED TO DETERMINE APPROPRIATE     ANTICOAGULANT THERAPY.  BASIC METABOLIC PANEL     Status: Abnormal   Collection Time    01/24/14  3:30 AM      Result Value Ref Range   Sodium 133 (*) 137 - 147 mEq/L   Potassium 4.3  3.7 - 5.3 mEq/L   Chloride 100  96 - 112 mEq/L   CO2 21  19 - 32 mEq/L   Glucose, Bld 123 (*) 70 - 99 mg/dL   BUN 23  6 - 23 mg/dL   Creatinine, Ser 0.72  0.50 - 1.35 mg/dL   Calcium 8.0 (*) 8.4 - 10.5 mg/dL   GFR calc non Af Amer 87 (*) >90 mL/min   GFR calc Af Amer >90  >90 mL/min   Comment: (NOTE)     The eGFR has been calculated using the CKD EPI equation.     This calculation has not been validated in all clinical situations.     eGFR's persistently <90 mL/min signify possible Chronic Kidney     Disease.  MAGNESIUM     Status: None   Collection Time    01/24/14  3:30 AM      Result Value Ref Range   Magnesium 2.1  1.5 - 2.5 mg/dL  POCT I-STAT 3, ART BLOOD GAS (G3+)     Status: None   Collection Time    01/24/14  3:36 AM      Result Value Ref Range   pH, Arterial 7.402  7.350 - 7.450   pCO2 arterial 35.5  35.0 - 45.0 mmHg   pO2, Arterial 83.0  80.0 - 100.0 mmHg   Bicarbonate 22.4  20.0 - 24.0 mEq/L   TCO2 24  0 - 100 mmol/L   O2 Saturation 97.0     Acid-base deficit 2.0  0.0 - 2.0 mmol/L   Patient temperature 96.0 F     Collection site RADIAL, ALLEN'S TEST ACCEPTABLE     Drawn by Operator     Sample type ARTERIAL    POCT I-STAT  4, (NA,K, GLUC, HGB,HCT)     Status: Abnormal   Collection Time    01/24/14  3:36 AM      Result Value Ref Range   Sodium 135 (*) 137 - 147 mEq/L   Potassium 4.1  3.7 - 5.3 mEq/L   Glucose, Bld 123 (*) 70 - 99 mg/dL   HCT 42.0  39.0 - 52.0 %   Hemoglobin 14.3  13.0 - 17.0 g/dL  CARBOXYHEMOGLOBIN     Status: Abnormal   Collection Time    01/24/14  5:00 AM      Result Value Ref Range   Total hemoglobin 14.4  13.5 - 18.0 g/dL   O2 Saturation 65.4     Carboxyhemoglobin 1.7 (*) 0.5 - 1.5 %   Methemoglobin 0.8  0.0 - 1.5 %  GLUCOSE, CAPILLARY     Status: None   Collection Time    01/24/14  5:03 AM      Result Value Ref Range   Glucose-Capillary 89  70 - 99 mg/dL  GLUCOSE, CAPILLARY     Status: None   Collection Time    01/24/14  5:58 AM      Result Value Ref Range   Glucose-Capillary 91  70 - 99 mg/dL  MRSA PCR SCREENING     Status: None  Collection Time    01/24/14  6:30 AM      Result Value Ref Range   MRSA by PCR NEGATIVE  NEGATIVE   Comment:            The GeneXpert MRSA Assay (FDA     approved for NASAL specimens     only), is one component of a     comprehensive MRSA colonization     surveillance program. It is not     intended to diagnose MRSA     infection nor to guide or     monitor treatment for     MRSA infections.  GLUCOSE, CAPILLARY     Status: None   Collection Time    01/24/14  6:54 AM      Result Value Ref Range   Glucose-Capillary 95  70 - 99 mg/dL  GLUCOSE, CAPILLARY     Status: Abnormal   Collection Time    01/24/14  8:07 AM      Result Value Ref Range   Glucose-Capillary 107 (*) 70 - 99 mg/dL  CBC     Status: Abnormal   Collection Time    01/24/14  9:15 AM      Result Value Ref Range   WBC 21.4 (*) 4.0 - 10.5 K/uL   RBC 4.55  4.22 - 5.81 MIL/uL   Hemoglobin 13.1  13.0 - 17.0 g/dL   HCT 36.9 (*) 39.0 - 52.0 %   MCV 81.1  78.0 - 100.0 fL   MCH 28.8  26.0 - 34.0 pg   MCHC 35.5  30.0 - 36.0 g/dL   RDW 14.0  11.5 - 15.5 %   Platelets 218  150  - 400 K/uL  MAGNESIUM     Status: Abnormal   Collection Time    01/24/14  9:15 AM      Result Value Ref Range   Magnesium 3.4 (*) 1.5 - 2.5 mg/dL  CREATININE, SERUM     Status: Abnormal   Collection Time    01/24/14  9:15 AM      Result Value Ref Range   Creatinine, Ser 1.06  0.50 - 1.35 mg/dL   GFR calc non Af Amer 65 (*) >90 mL/min   GFR calc Af Amer 76 (*) >90 mL/min   Comment: (NOTE)     The eGFR has been calculated using the CKD EPI equation.     This calculation has not been validated in all clinical situations.     eGFR's persistently <90 mL/min signify possible Chronic Kidney     Disease.  POCT I-STAT, CHEM 8     Status: Abnormal   Collection Time    01/24/14  9:37 AM      Result Value Ref Range   Sodium 132 (*) 137 - 147 mEq/L   Potassium 5.0  3.7 - 5.3 mEq/L   Chloride 103  96 - 112 mEq/L   BUN 24 (*) 6 - 23 mg/dL   Creatinine, Ser 1.10  0.50 - 1.35 mg/dL   Glucose, Bld 152 (*) 70 - 99 mg/dL   Calcium, Ion 1.24  1.13 - 1.30 mmol/L   TCO2 26  0 - 100 mmol/L   Hemoglobin 12.2 (*) 13.0 - 17.0 g/dL   HCT 36.0 (*) 39.0 - 52.0 %  GLUCOSE, CAPILLARY     Status: Abnormal   Collection Time    01/24/14  9:58 AM      Result Value Ref Range   Glucose-Capillary 150 (*) 70 -  99 mg/dL  GLUCOSE, CAPILLARY     Status: Abnormal   Collection Time    01/24/14 11:24 AM      Result Value Ref Range   Glucose-Capillary 160 (*) 70 - 99 mg/dL  POCT I-STAT 3, ART BLOOD GAS (G3+)     Status: Abnormal   Collection Time    01/24/14  3:36 PM      Result Value Ref Range   pH, Arterial 7.369  7.350 - 7.450   pCO2 arterial 43.0  35.0 - 45.0 mmHg   pO2, Arterial 121.0 (*) 80.0 - 100.0 mmHg   Bicarbonate 24.9 (*) 20.0 - 24.0 mEq/L   TCO2 26  0 - 100 mmol/L   O2 Saturation 99.0     Acid-base deficit 1.0  0.0 - 2.0 mmol/L   Patient temperature 98.0 F     Sample type ARTERIAL    GLUCOSE, CAPILLARY     Status: Abnormal   Collection Time    01/24/14  3:37 PM      Result Value Ref Range    Glucose-Capillary 157 (*) 70 - 99 mg/dL  CARBOXYHEMOGLOBIN     Status: Abnormal   Collection Time    01/24/14  3:45 PM      Result Value Ref Range   Total hemoglobin 9.7 (*) 13.5 - 18.0 g/dL   O2 Saturation 81.1     Carboxyhemoglobin 1.9 (*) 0.5 - 1.5 %   Methemoglobin 0.9  0.0 - 1.5 %  CBC     Status: Abnormal   Collection Time    01/24/14  3:45 PM      Result Value Ref Range   WBC 16.1 (*) 4.0 - 10.5 K/uL   RBC 3.62 (*) 4.22 - 5.81 MIL/uL   Hemoglobin 10.3 (*) 13.0 - 17.0 g/dL   HCT 29.1 (*) 39.0 - 52.0 %   MCV 80.4  78.0 - 100.0 fL   MCH 28.5  26.0 - 34.0 pg   MCHC 35.4  30.0 - 36.0 g/dL   RDW 14.2  11.5 - 15.5 %   Platelets 165  150 - 400 K/uL   Comment: REPEATED TO VERIFY     SPECIMEN CHECKED FOR CLOTS     DELTA CHECK NOTED  POCT I-STAT 3, ART BLOOD GAS (G3+)     Status: Abnormal   Collection Time    01/24/14  6:04 PM      Result Value Ref Range   pH, Arterial 7.392  7.350 - 7.450   pCO2 arterial 38.4  35.0 - 45.0 mmHg   pO2, Arterial 127.0 (*) 80.0 - 100.0 mmHg   Bicarbonate 23.6  20.0 - 24.0 mEq/L   TCO2 25  0 - 100 mmol/L   O2 Saturation 99.0     Acid-base deficit 1.0  0.0 - 2.0 mmol/L   Patient temperature 97.1 F     Sample type ARTERIAL    GLUCOSE, CAPILLARY     Status: Abnormal   Collection Time    01/24/14  7:22 PM      Result Value Ref Range   Glucose-Capillary 160 (*) 70 - 99 mg/dL  POCT I-STAT 3, ART BLOOD GAS (G3+)     Status: Abnormal   Collection Time    01/24/14  7:25 PM      Result Value Ref Range   pH, Arterial 7.389  7.350 - 7.450   pCO2 arterial 39.6  35.0 - 45.0 mmHg   pO2, Arterial 119.0 (*) 80.0 - 100.0 mmHg  Bicarbonate 24.1 (*) 20.0 - 24.0 mEq/L   TCO2 25  0 - 100 mmol/L   O2 Saturation 99.0     Acid-base deficit 1.0  0.0 - 2.0 mmol/L   Patient temperature 97.3 F     Sample type ARTERIAL    POCT I-STAT, CHEM 8     Status: Abnormal   Collection Time    01/24/14  7:32 PM      Result Value Ref Range   Sodium 137  137 - 147 mEq/L    Potassium 4.2  3.7 - 5.3 mEq/L   Chloride 101  96 - 112 mEq/L   BUN 26 (*) 6 - 23 mg/dL   Creatinine, Ser 1.20  0.50 - 1.35 mg/dL   Glucose, Bld 161 (*) 70 - 99 mg/dL   Calcium, Ion 1.23  1.13 - 1.30 mmol/L   TCO2 23  0 - 100 mmol/L   Hemoglobin 9.5 (*) 13.0 - 17.0 g/dL   HCT 28.0 (*) 39.0 - 52.0 %  GLUCOSE, CAPILLARY     Status: Abnormal   Collection Time    01/24/14 11:37 PM      Result Value Ref Range   Glucose-Capillary 148 (*) 70 - 99 mg/dL   Comment 1 Documented in Chart     Comment 2 Notify RN    GLUCOSE, CAPILLARY     Status: Abnormal   Collection Time    01/25/14  3:47 AM      Result Value Ref Range   Glucose-Capillary 118 (*) 70 - 99 mg/dL   Comment 1 Documented in Chart     Comment 2 Notify RN    CBC     Status: Abnormal   Collection Time    01/25/14  4:20 AM      Result Value Ref Range   WBC 20.9 (*) 4.0 - 10.5 K/uL   RBC 3.35 (*) 4.22 - 5.81 MIL/uL   Hemoglobin 9.7 (*) 13.0 - 17.0 g/dL   HCT 27.1 (*) 39.0 - 52.0 %   MCV 80.9  78.0 - 100.0 fL   MCH 29.0  26.0 - 34.0 pg   MCHC 35.8  30.0 - 36.0 g/dL   RDW 14.6  11.5 - 15.5 %   Platelets 158  150 - 400 K/uL  BASIC METABOLIC PANEL     Status: Abnormal   Collection Time    01/25/14  4:20 AM      Result Value Ref Range   Sodium 136 (*) 137 - 147 mEq/L   Potassium 4.3  3.7 - 5.3 mEq/L   Chloride 101  96 - 112 mEq/L   CO2 23  19 - 32 mEq/L   Glucose, Bld 127 (*) 70 - 99 mg/dL   BUN 29 (*) 6 - 23 mg/dL   Creatinine, Ser 1.11  0.50 - 1.35 mg/dL   Calcium 8.6  8.4 - 10.5 mg/dL   GFR calc non Af Amer 62 (*) >90 mL/min   GFR calc Af Amer 71 (*) >90 mL/min   Comment: (NOTE)     The eGFR has been calculated using the CKD EPI equation.     This calculation has not been validated in all clinical situations.     eGFR's persistently <90 mL/min signify possible Chronic Kidney     Disease.  GLUCOSE, CAPILLARY     Status: Abnormal   Collection Time    01/25/14  7:51 AM      Result Value Ref Range   Glucose-Capillary  113 (*)  70 - 99 mg/dL   Comment 1 Notify RN    GLUCOSE, CAPILLARY     Status: Abnormal   Collection Time    01/25/14 11:51 AM      Result Value Ref Range   Glucose-Capillary 171 (*) 70 - 99 mg/dL   Comment 1 Notify RN        Component Value Date/Time   SDES FLUID 01/23/2014 2043   SDES FLUID 01/23/2014 2043   [place] FLUID 01/23/2014 2043   [place] MEDIASTINAL FLUID 01/23/2014 2043   CULT  Value: FEW STREPTOCOCCUS SPECIES Note: CRITICAL RESULT CALLED TO, READ BACK BY AND VERIFIED WITH: NIKKI POTTER_[email or web address]  ON [id number] BY [facility] Performed at [facility] 01/23/2014 2043   REPTSTATUS 01/23/2014 FINAL 01/23/2014 2043   REPTSTATUS PENDING 01/23/2014 2043   Dg Chest 2 View  01/23/2014   CLINICAL DATA:  Shortness of breath.  Fatigue.  EXAM: CHEST  2 VIEW  COMPARISON:  04/21/2013  FINDINGS: The patient has developed fullness in the left hilum since the prior exam, worrisome for adenopathy. Overall heart size and pulmonary vascularity are normal. There is slight chronic interstitial disease at the lung bases, left more than right. No effusions. No osseous abnormality of significance.  IMPRESSION: New prominence of the left hilum worrisome for adenopathy. CT scan of the chest with contrast is recommended for further evaluation.   Electronically Signed   By: Rozetta Nunnery M.D.   On: 01/23/2014 16:03   Ct Head Wo Contrast  01/23/2014   CLINICAL DATA:  Weakness  EXAM: CT HEAD WITHOUT CONTRAST  TECHNIQUE: Contiguous axial images were obtained from the base of the skull through the vertex without intravenous contrast.  COMPARISON:  CT head 12/26/2011  FINDINGS: Age-appropriate atrophy. Negative for hydrocephalus. Chronic microvascular ischemic changes in the cerebral white matter. 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  Negative for acute infarct, hemorrhage, or mass lesion.  IMPRESSION: Chronic ischemic change.  No acute abnormality.   Electronically Signed   By: Franchot Gallo M.D.   On: 01/23/2014 16:34   Ct Angio Chest Pe W/cm &/or Wo Cm  01/23/2014   CLINICAL DATA:  Fatigue , SOB, r/o PE  EXAM: CT ANGIOGRAPHY CHEST WITH CONTRAST  TECHNIQUE: Multidetector CT imaging of the chest was performed using the standard protocol during bolus administration of intravenous contrast. Multiplanar CT image reconstructions and MIPs were obtained to evaluate the vascular anatomy.  CONTRAST:  132m OMNIPAQUE IOHEXOL 350 MG/ML SOLN  COMPARISON:  None.  FINDINGS: There is probable blood in the anterior mediastinum. 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 This results in aortic lumen transverse diameter of approximately 6.4 cm. The lesion extends over craniocaudal length of approximately 6.4 cm. The lesion terminates at approximately the level of the right brachiocephalic artery origin. There is some lamellated clot around the periphery of the lesion.  Mass effect from the pseudoaneurysm narrows the main pulmonary artery at its bifurcation. There is a pulmonary embolus which is lodged in the main pulmonary artery at the level of the narrowing, partially extending into the proximal left pulmonary artery. No additional peripheral pulmonary emboli are evident. Elevated RV/LV ratio of 1.33.  There is atheromatous plaque in the aortic arch. Classic 3 vessel brachiocephalic arterial origin anatomy without proximal stenosis. There is scattered plaque in the descending thoracic aorta. No dissection. The distal arch and descending thoracic aorta are  normal in caliber.  There is a trace left pleural effusion. No pericardial effusion. A few calcified AP window lymph nodes. No hilar adenopathy. Coarse subpleural scarring in the periphery of the lungs most marked in the lung bases. No confluent airspace infiltrate or nodule.  Innumerable small calcifications in the visualized portions of liver  and spleen suggesting old granulomatous disease. Remainder visualized upper abdomen unremarkable. Degenerative spurs in the lower cervical, mid and lower thoracic spine. Sternum is intact. Advanced degenerative changes in the right shoulder.  Review of the MIP images confirms the above findings.  IMPRESSION: 1. 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 There is clot surrounding the lesion with some probable blood in the anterior mediastinum. 2. Narrowing of the main pulmonary artery secondary to mass effect from the ascending aortic pseudoaneurysm. 3. Pulmonary embolus in the distal main pulmonary artery at the level of the stenosis.  Critical Value/emergent results were called by telephone at the time of interpretation on 01/23/2014 at 5:53 PM to Dr. Shirlyn Goltz , who verbally acknowledged these results.  4. Evidence of right ventricular strain, more likely secondary to pulmonary artery stenosis than clot burden.   Electronically Signed   By: Arne Cleveland M.D.   On: 01/23/2014 17:55   Dg Chest Portable 1 View In Am  01/25/2014   CLINICAL DATA:  Status post thoracic aortic aneurysm repair.  EXAM: PORTABLE CHEST - 1 VIEW  COMPARISON:  01/24/2014  FINDINGS: Right internal jugular central venous line, mediastinal tube and bilateral chest tubes are stable. Endotracheal tube and nasogastric tube have been removed.  Opacity at the left lung base mostly obscures hemidiaphragm. This is likely combination of atelectasis and a small pleural effusion. Mild atelectasis is noted at the right medial lung base in adjacent to the right chest tube.  Vascular congestion and interstitial densities seen previously appear improved. No overt pulmonary edema. No pneumothorax.  No mediastinal widening.  IMPRESSION: 1. Status post cardiothoracic surgery. No acute abnormality or  evidence of an operative complication. 2. Persistent basilar atelectasis, greater on the left. Probable small left effusion. No overt pulmonary edema or pneumothorax. 3. Remaining support apparatus is well positioned.   Electronically Signed   By: Lajean Manes M.D.   On: 01/25/2014 07:57   Dg Chest Portable 1 View  01/24/2014   CLINICAL DATA:  Postoperative radiograph, status post pseudoaneurysm surgery.  EXAM: PORTABLE CHEST - 1 VIEW  COMPARISON:  CTA of the chest performed 01/23/2014  FINDINGS: The patient's endotracheal tube is seen ending 4-5 cm above the carina.  A right IJ sheath is noted ending about the proximal SVC; a right IJ line is noted ending at the distal SVC. An enteric tube is noted extending below the diaphragm. Two mediastinal drains and bilateral chest tubes are seen.  Vascular congestion is noted. Increased interstitial markings may reflect mild atelectasis or minimal interstitial edema. No definite pneumothorax is seen.  The cardiomediastinal silhouette is normal in size. The patient is status post median sternotomy. There is residual prominence of the superior mediastinum. No acute osseous abnormalities are identified.  IMPRESSION: 1. Endotracheal tube seen ending 4-5 cm above the carina. 2. Vascular congestion noted; increased interstitial markings may reflect mild atelectasis or minimal interstitial edema. 3. Residual prominence of the superior mediastinum.   Electronically Signed   By: Garald Balding M.D.   On: 01/24/2014 03:55  Recent Results (from the past 240 hour(s))  URINE CULTURE     Status: None   Collection Time    01/23/14  4:19 PM      Result Value Ref Range Status   Specimen Description URINE, CLEAN CATCH   Final   Special Requests NONE   Final   Culture  Setup Time     Final   Value: 01/24/2014 01:43     Performed at [facility]     Final   Value: NO GROWTH     Performed at [facility]   Culture     Final   Value: NO GROWTH       Performed at [facility]   Report Status 01/24/2014 FINAL   Final  CULTURE, BLOOD (ROUTINE X 2)     Status: None   Collection Time    01/23/14  4:45 PM      Result Value Ref Range Status   Specimen Description BLOOD BLOOD RIGHT FOREARM   Final   Special Requests BOTTLES DRAWN AEROBIC AND ANAEROBIC 4ML   Final   Culture  Setup Time     Final   Value: 01/23/2014 21:33     Performed at [facility]   Culture     Final   Value: STREPTOCOCCUS SPECIES     Note: Gram Stain Report Called to,Read Back By and Verified With: Lillia Dallas RN 01/24/14 11:20AM BY [place]     Performed at [facility]   Report Status PENDING   Incomplete  CULTURE, BLOOD (ROUTINE X 2)     Status: None   Collection Time    01/23/14  4:47 PM      Result Value Ref Range Status   Specimen Description BLOOD RIGHT ANTECUBITAL   Final   Special Requests BOTTLES DRAWN AEROBIC AND ANAEROBIC 4ML   Final   Culture  Setup Time     Final   Value: 01/23/2014 21:33     Performed at [facility]   Culture     Final   Value: STREPTOCOCCUS SPECIES     Note: Gram Stain Report Called to,Read Back By and Verified With: Lillia Dallas RN 01/23/14 11:20AM BY [place]     Performed at [facility]   Report Status PENDING   Incomplete  GRAM STAIN     Status: None   Collection Time    01/23/14  8:43 PM      Result Value Ref Range Status   Specimen Description FLUID   Final   Special Requests MEDIASTINAL FLUID   Final   Gram Stain     Final   Value: RARE WBC PRESENT,BOTH PMN AND MONONUCLEAR     NO ORGANISMS SEEN     CALLED TO C.YATES,RN 2036 01/23/14   Report Status 01/23/2014 FINAL   Final  BODY FLUID CULTURE     Status: None   Collection Time    01/23/14  8:43 PM      Result Value Ref Range Status   Specimen Description FLUID   Final   Special Requests MEDIASTINAL FLUID   Final   Gram Stain     Final   Value: RARE WBC PRESENT,BOTH PMN AND MONONUCLEAR     NO ORGANISMS SEEN     Gram  Stain Report Called to,Read Back By and Verified With: Gram Stain Report Called to,Read Back By and Verified WithJule Economy RN 2036 01/23/14 Performed at [facility]     Performed at [facility]   Culture     Final   Value: FEW STREPTOCOCCUS SPECIES     Note: CRITICAL RESULT CALLED TO, READ BACK BY AND VERIFIED WITH: NIKKI POTTER_0  ON [id number] BY [facility]     Performed at [facility]   Report Status PENDING   Incomplete  MRSA PCR SCREENING     Status: None   Collection Time    01/24/14  6:30 AM      Result Value Ref Range Status   MRSA by PCR NEGATIVE  NEGATIVE Final   Comment:            The GeneXpert MRSA Assay (FDA     approved for NASAL specimens     only), is one component of a     comprehensive MRSA colonization     surveillance program. It is not     intended to diagnose MRSA     infection nor to guide or     monitor treatment for     MRSA infections.      01/25/2014, 1:25 PM     LOS: 2 days     **Disclaimer: This note may have been dictated with voice recognition software. Similar sounding words can inadvertently be transcribed and this note may contain transcription errors which may not have been corrected upon publication of note.**

## 2014-01-25 NOTE — Progress Notes (Signed)
Anesthesiology Follow-up:  Awake, mildly confused suspect due to morphine he received last night. He denies pain. O/W neurologically intact.  VS: T-36.8 BP-106/61 RR- 26 HR- 87 (SR)  O2 Sat 97% On 2L  K-3.6 BUN/Cr.- 29/1.11 glucose 127 H/H- 9.7/27 Plts- 158,000  Extubated at 18:30 last night.  As noted blood cultures positive for streptococcus species. ID to see him.  2 days post repair of ascending aortic pseudoaneurysm.With deep hypothermic circulatory arrest and cogulopathy post-CPB. Doing well overall. Await ID evaluation.  Roberts Gaudy

## 2014-01-25 NOTE — Progress Notes (Signed)
Patient ID: Danny Frederick, male   DOB: April 19, 1934, 80 y.o.   MRN: [id number]  SICU Evening Rounds:  Hemodynamically stable  Urine output good on lasix drip 10  BMET    Component Value Date/Time   NA 135* 01/25/2014 1633   K 4.4 01/25/2014 1633   CL 102 01/25/2014 1633   CO2 23 01/25/2014 0420   GLUCOSE 137* 01/25/2014 1633   BUN 26* 01/25/2014 1633   CREATININE 1.30 01/25/2014 1633   CREATININE 0.98 01/02/2014 1023   CALCIUM 8.6 01/25/2014 0420   GFRNONAA 63* 01/25/2014 1615   GFRAA 73* 01/25/2014 1615    Walked a little today.

## 2014-01-25 NOTE — Addendum Note (Signed)
Addendum created 01/25/14 1037 by Roberts Gaudy, MD   Modules edited: Clinical Notes   Clinical Notes:  File: [id number]; Pend: [id number]; Pend: [id number]; Pend: [id number]

## 2014-01-25 NOTE — Progress Notes (Signed)
*  PRELIMINARY RESULTS* Vascular Ultrasound Lower extremity venous duplex has been completed.  Preliminary findings: no evidence of DVT or baker's cyst.   Landry Mellow, RDMS, RVT  01/25/2014, 2:48 PM

## 2014-01-25 NOTE — Progress Notes (Addendum)
[address]       [place] [address]             [phone]      2 Days Post-Op Procedure(s) (LRB): THORACIC ASCENDING ANEURYSM REPAIR (AAA) (N/A)  Subjective:  Danny Frederick is doing okay this morning.  He denies pain.  He is hard of hearing and is mildly confused.  Objective: Vital signs in last 24 hours: Temp:  [94.4 F (34.7 C)-98.4 F (36.9 C)] 98.2 F (36.8 C) (05/27 0753) Pulse Rate:  [65-88] 86 (05/27 0700) Cardiac Rhythm:  [-] Normal sinus rhythm (05/27 0400) Resp:  [0-33] 33 (05/27 0700) BP: (87-128)/(54-82) 94/68 mmHg (05/27 0600) SpO2:  [95 %-100 %] 96 % (05/27 0700) Arterial Line BP: (94-136)/(45-74) 121/64 mmHg (05/27 0700) FiO2 (%):  [40 %-50 %] 40 % (05/26 1800) Weight:  [152 lb 12.5 oz (69.3 kg)] 152 lb 12.5 oz (69.3 kg) (05/27 0500)  Hemodynamic parameters for last 24 hours: CVP:  [4 mmHg-15 mmHg] 5 mmHg  Intake/Output from previous day: 05/26 0701 - 05/27 0700 In: 3561.8 [P.O.:200; I.V.:1721.8; IV Piggyback:1640] Out: 1670 [Urine:930; Chest Tube:740]  General appearance: alert, cooperative and no distress Heart: regular rate and rhythm Lungs: clear to auscultation bilaterally Abdomen: soft, non-tender; bowel sounds normal; no masses,  no organomegaly Extremities: edema trace Wound: clean and dry  Lab Results:  Recent Labs  01/24/14 1545 01/24/14 1932 01/25/14 0420  WBC 16.1*  --  20.9*  HGB 10.3* 9.5* 9.7*  HCT 29.1* 28.0* 27.1*  PLT 165  --  158   BMET:  Recent Labs  01/24/14 0330  01/24/14 1932 01/25/14 0420  NA 133*  < > 137 136*  K 4.3  < > 4.2 4.3  CL 100  < > 101 101  CO2 21  --   --  23  GLUCOSE 123*  < > 161* 127*  BUN 23  < > 26* 29*  CREATININE 0.72  < > 1.20 1.11  CALCIUM 8.0*  --   --  8.6  < > = values in this interval not displayed.  PT/INR:  Recent Labs  01/24/14 0330  LABPROT 12.7  INR 0.97   ABG    Component Value Date/Time   PHART 7.389 01/24/2014 1925   HCO3 24.1* 01/24/2014  1925   TCO2 23 01/24/2014 1932   ACIDBASEDEF 1.0 01/24/2014 1925   O2SAT 99.0 01/24/2014 1925   CBG (last 3)   Recent Labs  01/24/14 1922 01/24/14 2337 01/25/14 0347  GLUCAP 160* 148* 118*    Blood Culture GRAM POSITIVE COCCI IN PAIRS AND CHAINS Note: Gram Stain Report Called to,Read Back By and Verified With: Lillia Dallas RN 01/24/14 11:20AM BY [place] Performed at [facility]   Assessment/Plan: S/P Procedure(s) (LRB): THORACIC ASCENDING ANEURYSM REPAIR (AAA) (N/A)  1. CV- NSR- remains on low dose Milrinone, and NTG- wean drips as tolerated- HR and pressure controlled 2. Pulm- wean oxygen as tolerated, + atelectasis on CXR, no significant pleural effusions 3. Renal- creatinine minimally elevated at 1.11, + hypervolemia- will start diuresis 4. Expected Acute Blood Loss Anemia- Coagulopathy resolved, Hgb stable at 9.7 5. ID- +Bacteremia- continue Vancomycin, Levaquin 6. CBGs controlled, continue SSIP 7. Dispo- patient stable, wean drips as tolerated, possibly remove chest tubes today, venous duplex pending   LOS: 2 days    Danny Frederick 01/25/2014   I have seen and examined the patient and agree with the assessment and plan as outlined.  Patient looks remarkably good.  Developed mild confusion and reports some hallucinations after receiving IV morphine.    Positive blood cultures on admission presumable due to endocarditis w/ infected false aneurysm.  Will ask Infectious Disease team to see in consultation.  Await final ID of pathogen but he will need prolonged course of IV antibiotics.  Mobilize.  Diuresis.  Leave chest tubes in for now.  Increase metoprolol.  Restart Norvasc.  Restart ACE-I tomorrow if renal function stable.  Start coumadin slowly.  No other pharmacologic anticoagulation yet due to risk of bleeding.  Check LE duplex scan to r/o DVT since patient had clot in the main pulmonary artery at the time of presentation, although I feel the clot was likely not  embolic but developed in situ as a result of the false aneurysm w/ compression of PA.  Danny Frederick 01/25/2014 8:59 AM

## 2014-01-26 ENCOUNTER — Inpatient Hospital Stay (HOSPITAL_COMMUNITY): Payer: Medicare Other

## 2014-01-26 ENCOUNTER — Encounter (HOSPITAL_COMMUNITY): Payer: Self-pay | Admitting: Thoracic Surgery (Cardiothoracic Vascular Surgery)

## 2014-01-26 DIAGNOSIS — B953 Streptococcus pneumoniae as the cause of diseases classified elsewhere: Secondary | ICD-10-CM

## 2014-01-26 LAB — TYPE AND SCREEN
ABO/RH(D): B POS
Antibody Screen: NEGATIVE
UNIT DIVISION: 0
UNIT DIVISION: 0
UNIT DIVISION: 0
UNIT DIVISION: 0
UNIT DIVISION: 0
UNIT DIVISION: 0
Unit division: 0
Unit division: 0
Unit division: 0
Unit division: 0
Unit division: 0
Unit division: 0
Unit division: 0
Unit division: 0
Unit division: 0
Unit division: 0
Unit division: 0
Unit division: 0
Unit division: 0
Unit division: 0

## 2014-01-26 LAB — CULTURE, BLOOD (ROUTINE X 2)

## 2014-01-26 LAB — BODY FLUID CULTURE

## 2014-01-26 LAB — CBC
HEMATOCRIT: 30.1 % — AB (ref 39.0–52.0)
HEMOGLOBIN: 10.5 g/dL — AB (ref 13.0–17.0)
MCH: 28.7 pg (ref 26.0–34.0)
MCHC: 34.9 g/dL (ref 30.0–36.0)
MCV: 82.2 fL (ref 78.0–100.0)
Platelets: 200 10*3/uL (ref 150–400)
RBC: 3.66 MIL/uL — AB (ref 4.22–5.81)
RDW: 14.9 % (ref 11.5–15.5)
WBC: 17.2 10*3/uL — ABNORMAL HIGH (ref 4.0–10.5)

## 2014-01-26 LAB — BASIC METABOLIC PANEL
BUN: 28 mg/dL — ABNORMAL HIGH (ref 6–23)
CHLORIDE: 98 meq/L (ref 96–112)
CO2: 29 meq/L (ref 19–32)
Calcium: 8.6 mg/dL (ref 8.4–10.5)
Creatinine, Ser: 1.09 mg/dL (ref 0.50–1.35)
GFR calc non Af Amer: 63 mL/min — ABNORMAL LOW (ref >90)
GFR, EST AFRICAN AMERICAN: 73 mL/min — AB (ref >90)
Glucose, Bld: 111 mg/dL — ABNORMAL HIGH (ref 70–99)
POTASSIUM: 3.7 meq/L (ref 3.7–5.3)
SODIUM: 138 meq/L (ref 137–147)

## 2014-01-26 LAB — GLUCOSE, CAPILLARY
GLUCOSE-CAPILLARY: 115 mg/dL — AB (ref 70–99)
GLUCOSE-CAPILLARY: 132 mg/dL — AB (ref 70–99)
Glucose-Capillary: 93 mg/dL (ref 70–99)

## 2014-01-26 MED ORDER — ENSURE COMPLETE PO LIQD
237.0000 mL | Freq: Two times a day (BID) | ORAL | Status: DC
  Administered 2014-01-27: 237 mL via ORAL

## 2014-01-26 MED ORDER — SODIUM CHLORIDE 0.9 % IV SOLN: INTRAVENOUS | Status: DC

## 2014-01-26 MED ORDER — POTASSIUM CHLORIDE 10 MEQ/50ML IV SOLN
10.0000 meq | INTRAVENOUS | Status: AC
  Administered 2014-01-26 (×3): 10 meq via INTRAVENOUS

## 2014-01-26 MED ORDER — LISINOPRIL 20 MG PO TABS: 20.0000 mg | ORAL_TABLET | Freq: Every day | ORAL | Status: DC

## 2014-01-26 MED ORDER — ENSURE PUDDING PO PUDG: 1.0000 | Freq: Three times a day (TID) | ORAL | Status: DC

## 2014-01-26 MED ORDER — SODIUM CHLORIDE 0.9 % IJ SOLN: 10.0000 mL | INTRAMUSCULAR | Status: DC | PRN

## 2014-01-26 MED ORDER — LISINOPRIL 20 MG PO TABS
30.0000 mg | ORAL_TABLET | Freq: Every day | ORAL | Status: DC
  Administered 2014-01-26 – 2014-01-27 (×2): 30 mg via ORAL
  Filled 2014-01-26 (×2): qty 1

## 2014-01-26 MED ORDER — SODIUM CHLORIDE 0.9 % IJ SOLN
10.0000 mL | Freq: Two times a day (BID) | INTRAMUSCULAR | Status: DC
  Administered 2014-01-26: 10 mL
  Administered 2014-01-26: 20 mL
  Administered 2014-01-27 – 2014-01-30 (×5): 10 mL

## 2014-01-26 MED ORDER — POTASSIUM CHLORIDE 10 MEQ/50ML IV SOLN
10.0000 meq | INTRAVENOUS | Status: DC | PRN
  Administered 2014-01-26 – 2014-01-28 (×2): 10 meq via INTRAVENOUS
  Filled 2014-01-26 (×9): qty 50

## 2014-01-26 MED ORDER — AMLODIPINE BESYLATE 2.5 MG PO TABS
2.5000 mg | ORAL_TABLET | Freq: Every day | ORAL | Status: DC
  Administered 2014-01-26 – 2014-01-27 (×2): 2.5 mg via ORAL
  Filled 2014-01-26 (×2): qty 1

## 2014-01-26 NOTE — Progress Notes (Addendum)
NUTRITION FOLLOW UP  INTERVENTION: Ensure Complete po BID, each supplement provides 350 kcal and 13 grams of protein RD to follow for nutrition care plan  NUTRITION DIAGNOSIS: Inadequate oral intake now related to limited appetite as evidenced by PO intake 40%, ongoing  Goal: Pt to meet >/= 90% of their estimated nutrition needs, unmet  Monitor:  PO & supplemental intake, weight, labs, I/O's  ASSESSMENT: 80 year old male with history of HTN, GE reflux disease, benign prostatic hypertrophy, and degenerative disc disease of the cervical and lumbar spine; chest x-ray demonstrated widening mediastinum with prominence of the left hilum.  A CT angiogram of the chest was performed to rule out pulmonary embolus. This reveals a large aneurysm of the distal ascending thoracic aorta that appears to have contained rupture. Sent directly to [facility] ED for cardiothoracic surgical consultation.  Patient s/p procedure 5/25: REPAIR OF RUPTURED ASCENDING THORACIC AORTIC ANEURYSM  Patient extubated 5/26.  Diet advanced to Full Liquids 5/27.  Per RN, patient not eating very well.  PO intake 30-40% per flowsheet records.  Would benefit from addition of oral nutrition supplement.  RD to order.  ID note reviewed 5/27.  Pt with streptococcal bacteremia.  Plan for PICC line placement for long-term ABX.  Height: Ht Readings from Last 1 Encounters:  01/25/14 5\' 3"  (1.6 m)    Weight: Wt Readings from Last 1 Encounters:  01/26/14 141 lb 12.1 oz (64.3 kg)    Ideal Body Weight: 124 lb  % Ideal Body Weight: 129%  Wt Readings from Last 10 Encounters:  01/26/14 141 lb 12.1 oz (64.3 kg)  01/26/14 141 lb 12.1 oz (64.3 kg)  01/02/14 131 lb (59.421 kg)  12/22/13 133 lb (60.328 kg)  12/08/13 136 lb (61.689 kg)  09/16/13 142 lb (64.411 kg)  07/14/13 138 lb (62.596 kg)  06/23/13 133 lb 3.2 oz (60.419 kg)  06/10/13 133 lb (60.328 kg)  05/27/13 133 lb (60.328 kg)    Usual Body Weight: 131 lb  %  Usual Body Weight: 117%  BMI:  Body mass index is 25.12 kg/(m^2).  Estimated Nutritional Needs: Kcal: 1700-1850 Protein: 115-125 gm Fluid: per MD  Skin: chest surgical incision   Diet Order: Full Liquid  EDUCATION NEEDS: -No education needs identified at this time   Intake/Output Summary (Last 24 hours) at 01/26/14 1236 Last data filed at 01/26/14 1200  Gross per 24 hour  Intake   1555 ml  Output   7105 ml  Net  -5550 ml    Labs:   Recent Labs Lab 01/24/14 0330  01/24/14 0915  01/25/14 0420 01/25/14 1615 01/25/14 1633 01/26/14 0427  NA 133*  < >  --   < > 136*  --  135* 138  K 4.3  < >  --   < > 4.3  --  4.4 3.7  CL 100  --   --   < > 101  --  102 98  CO2 21  --   --   --  23  --   --  29  BUN 23  --   --   < > 29*  --  26* 28*  CREATININE 0.72  --  1.06  < > 1.11 1.09 1.30 1.09  CALCIUM 8.0*  --   --   --  8.6  --   --  8.6  MG 2.1  --  3.4*  --   --  2.8*  --   --   GLUCOSE 123*  < >  --   < >  127*  --  137* 111*  < > = values in this interval not displayed.  CBG (last 3)   Recent Labs  01/25/14 2357 01/26/14 0345 01/26/14 0752  GLUCAP 115* 132* 93    Scheduled Meds: . acetaminophen  1,000 mg Oral 4 times per day  . amLODipine  2.5 mg Oral Daily  . antiseptic oral rinse  15 mL Mouth Rinse q12n4p  . aspirin EC  81 mg Oral Daily  . bisacodyl  10 mg Oral Daily   Or  . bisacodyl  10 mg Rectal Daily  . cefTRIAXone (ROCEPHIN)  IV  2 g Intravenous Q24H  . docusate sodium  200 mg Oral Daily  . levothyroxine  50 mcg Oral QAC breakfast  . lisinopril  30 mg Oral Daily  . metoprolol tartrate  25 mg Oral BID  . pantoprazole  40 mg Oral Daily  . rifampin  300 mg Oral Daily  . sodium chloride  10-40 mL Intracatheter Q12H  . sodium chloride  10-40 mL Intracatheter Q12H  . sodium chloride  3 mL Intravenous Q12H  . warfarin  2.5 mg Oral q1800  . Warfarin - Physician Dosing Inpatient   Does not apply q1800    Continuous Infusions: . furosemide (LASIX)  infusion 10 mg/hr (01/26/14 1200)    Past Medical History  Diagnosis Date  . Hypertension   . Unspecified hypothyroidism   . Mitral valve prolapse     h/o; normal echo 12/2011 with no MVP seen  . Foot drop, right   . BPH (benign prostatic hypertrophy)   . Iron deficiency anemia, unspecified 6/09  . Hearing loss in left ear   . Diverticulosis of colon 12/09  . Internal hemorrhoids   . GERD (gastroesophageal reflux disease)   . Allergic rhinitis, cause unspecified     on allergy shots (Dr. Orvil Feil)  . Allergic conjunctivitis   . Ruptured thoracic aortic aneurysm 01/23/2014  . 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 Right ventricular dysfunction 01/23/2014    Secondary to obstruction of main pulmonary artery    Past Surgical History  Procedure Laterality Date  . Prostate surgery  2007    photovaporization  . Cervical laminectomy  1972    C5-6  . Spine surgery  2006    L4-5 disk surgery  . Rotator cuff repair  10/2008    left; Dr. Onnie Graham  . Esophagogastroduodenoscopy  10/31/08    normal; Dr. Edison Nasuti  . Tonsillectomy    . Hemorroidal banding  04/07/2013    x3-Dr.Eric Redmond Pulling  . Hemorrhoidectomy with hemorrhoid banding  04/07/13  . Spine surgery  04/2013    L4-5, L5-S1 fusion.  Dr. Christella Noa  . Thoracic aortic aneurysm repair N/A 01/23/2014    Procedure: THORACIC ASCENDING ANEURYSM REPAIR (AAA);  Surgeon: Rexene Alberts, MD;  Location: [facility];  Service: Open Heart Surgery;  Laterality: N/A;    Arthur Holms, RD, LDN Pager #: [phone] After-Hours Pager #: [phone]

## 2014-01-26 NOTE — Progress Notes (Signed)
Peripherally Inserted Central Catheter/Midline Placement  The IV Nurse has discussed with the patient and/or persons authorized to consent for the patient, the purpose of this procedure and the potential benefits and risks involved with this procedure.  The benefits include less needle sticks, lab draws from the catheter and patient may be discharged home with the catheter.  Risks include, but not limited to, infection, bleeding, blood clot (thrombus formation), and puncture of an artery; nerve damage and irregular heat beat.  Alternatives to this procedure were also discussed.  PICC/Midline Placement Documentation        Darlyn Read 01/26/2014, 11:02 AM

## 2014-01-26 NOTE — Progress Notes (Addendum)
      [address]       [place] [address]             [phone]        CARDIOTHORACIC SURGERY PROGRESS NOTE   R3 Days Post-Op Procedure(s) (LRB): THORACIC ASCENDING ANEURYSM REPAIR (AAA) (N/A)  Subjective: Looks good.  No specific complaints.  Objective: Vital signs: BP Readings from Last 1 Encounters:  01/26/14 84/57   Pulse Readings from Last 1 Encounters:  01/26/14 69   Resp Readings from Last 1 Encounters:  01/26/14 33   Temp Readings from Last 1 Encounters:  01/26/14 97.4 F (36.3 C) Oral    Hemodynamics: CVP:  [0 mmHg-72 mmHg] 30 mmHg  Physical Exam:  Rhythm:   sinus  Breath sounds: clear  Heart sounds:  RRR  Incisions:  Dressings dry, intact  Abdomen:  Soft, non-distended, non-tender  Extremities:  Warm, well-perfused    Intake/Output from previous day: 05/27 0701 - 05/28 0700 In: 2066.7 [P.O.:920; I.V.:366.7; IV Piggyback:300] Out: 6360 [Urine:5260; Chest Tube:1100] Intake/Output this shift:    Lab Results:  CBC: Recent Labs  01/25/14 1615 01/25/14 1633 01/26/14 0427  WBC 22.4*  --  17.2*  HGB 10.5* 10.5* 10.5*  HCT 30.1* 31.0* 30.1*  PLT 211  --  200    BMET:  Recent Labs  01/25/14 0420  01/25/14 1633 01/26/14 0427  NA 136*  --  135* 138  K 4.3  --  4.4 3.7  CL 101  --  102 98  CO2 23  --   --  29  GLUCOSE 127*  --  137* 111*  BUN 29*  --  26* 28*  CREATININE 1.11  < > 1.30 1.09  CALCIUM 8.6  --   --  8.6  < > = values in this interval not displayed.   CBG (last 3)   Recent Labs  01/25/14 1930 01/25/14 2357 01/26/14 0345  GLUCAP 114* 115* 132*    ABG    Component Value Date/Time   PHART 7.389 01/24/2014 1925   PCO2ART 39.6 01/24/2014 1925   PO2ART 119.0* 01/24/2014 1925   HCO3 24.1* 01/24/2014 1925   TCO2 22 01/25/2014 1633   ACIDBASEDEF 1.0 01/24/2014 1925   O2SAT 99.0 01/24/2014 1925    CXR: Looks good.  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

## 2014-01-26 NOTE — Progress Notes (Signed)
PHARMACIST - PHYSICIAN COMMUNICATION DR:  Roxy Manns CONCERNING: Pharmacy Care Issues Regarding Warfarin Labs  RECOMMENDATION (Action Taken): A baseline and daily protime for three days has been ordered to meet the [facility] Patient safety goal and comply with the current [place].   The Pharmacy will defer all warfarin dose order changes and follow up of lab results to the prescriber unless an additional order to initiate a "pharmacy Coumadin consult" is placed.  DESCRIPTION:  While hospitalized, to be in compliance with [facility] Patient Safety Goals, all patients on warfarin must have a baseline and/or current protime prior to the administration of warfarin. Pharmacy has received your order for warfarin without these required laboratory assessments.  Sloan Leiter, PharmD, BCPS Clinical Pharmacist [phone] 01/26/2014, 9:30 AM

## 2014-01-26 NOTE — Progress Notes (Signed)
INFECTIOUS DISEASE PROGRESS NOTE  ID: Danny Frederick is a 80 y.o. male with  Principal Problem:   Ruptured thoracic aortic aneurysm Active Problems:   Pulmonary artery thrombosis   Right ventricular dysfunction  Subjective: Without complaints  Abtx:  Anti-infectives   Start     Dose/Rate Route Frequency Ordered Stop   01/25/14 1500  rifampin (RIFADIN) capsule 300 mg     300 mg Oral Daily 01/25/14 1354     01/25/14 1500  cefTRIAXone (ROCEPHIN) 2 g in dextrose 5 % 50 mL IVPB     2 g 100 mL/hr over 30 Minutes Intravenous Every 24 hours 01/25/14 1354     01/24/14 1700  levofloxacin (LEVAQUIN) IVPB 750 mg  Status:  Discontinued     750 mg 100 mL/hr over 90 Minutes Intravenous Every 24 hours 01/24/14 1033 01/25/14 1354   01/24/14 1100  vancomycin (VANCOCIN) IVPB 1000 mg/200 mL premix  Status:  Discontinued     1,000 mg 200 mL/hr over 60 Minutes Intravenous  Once 01/24/14 0306 01/24/14 0934   01/24/14 1045  vancomycin (VANCOCIN) IVPB 1000 mg/200 mL premix  Status:  Discontinued     1,000 mg 200 mL/hr over 60 Minutes Intravenous Every 12 hours 01/24/14 1033 01/25/14 1354   01/24/14 0700  cefUROXime (ZINACEF) 1.5 g in dextrose 5 % 50 mL IVPB  Status:  Discontinued     1.5 g 100 mL/hr over 30 Minutes Intravenous Every 12 hours 01/24/14 0306 01/24/14 0934   01/23/14 2056  polymyxin B 500,000 Units, bacitracin 50,000 Units in sodium chloride irrigation 0.9 % 500 mL irrigation  Status:  Discontinued       As needed 01/23/14 2057 01/24/14 0306   01/23/14 1845  vancomycin (VANCOCIN) 1,250 mg in sodium chloride 0.9 % 250 mL IVPB     1,250 mg 166.7 mL/hr over 90 Minutes Intravenous To Surgery 01/23/14 1843 01/23/14 1936   01/23/14 1845  cefUROXime (ZINACEF) 1.5 g in dextrose 5 % 50 mL IVPB     1.5 g 100 mL/hr over 30 Minutes Intravenous To Surgery 01/23/14 1843 01/24/14 0035   01/23/14 1845  vancomycin (VANCOCIN) 1,000 mg in sodium chloride 0.9 % 1,000 mL irrigation      Irrigation To  Surgery 01/23/14 1836 01/23/14 2022   01/23/14 1830  cefUROXime (ZINACEF) 750 mg in dextrose 5 % 50 mL IVPB  Status:  Discontinued     750 mg 100 mL/hr over 30 Minutes Intravenous To Surgery 01/23/14 1836 01/24/14 0306   01/23/14 1645  levofloxacin (LEVAQUIN) IVPB 750 mg     750 mg 100 mL/hr over 90 Minutes Intravenous  Once 01/23/14 1633 01/23/14 1822      Medications:  Scheduled: . acetaminophen  1,000 mg Oral 4 times per day  . amLODipine  2.5 mg Oral Daily  . antiseptic oral rinse  15 mL Mouth Rinse q12n4p  . aspirin EC  81 mg Oral Daily  . bisacodyl  10 mg Oral Daily   Or  . bisacodyl  10 mg Rectal Daily  . cefTRIAXone (ROCEPHIN)  IV  2 g Intravenous Q24H  . docusate sodium  200 mg Oral Daily  . levothyroxine  50 mcg Oral QAC breakfast  . lisinopril  30 mg Oral Daily  . metoprolol tartrate  25 mg Oral BID  . pantoprazole  40 mg Oral Daily  . rifampin  300 mg Oral Daily  . sodium chloride  10-40 mL Intracatheter Q12H  . sodium chloride  10-40 mL Intracatheter  Q12H  . sodium chloride  3 mL Intravenous Q12H  . warfarin  2.5 mg Oral q1800  . Warfarin - Physician Dosing Inpatient   Does not apply q1800    Objective: Vital signs in last 24 hours: Temp:  [97.4 F (36.3 C)-97.6 F (36.4 C)] 97.4 F (36.3 C) (05/28 0400) Pulse Rate:  [69-88] 75 (05/28 0830) Resp:  [0-34] 24 (05/28 0830) BP: (84-155)/(56-113) 130/76 mmHg (05/28 0830) SpO2:  [90 %-100 %] 97 % (05/28 0830) Arterial Line BP: (76-149)/(53-86) 93/58 mmHg (05/28 0700) Weight:  [64.3 kg (141 lb 12.1 oz)] 64.3 kg (141 lb 12.1 oz) (05/28 0600)   General appearance: alert, cooperative and no distress Resp: clear to auscultation bilaterally Chest wall: no tenderness, midline wound is clean, well healed. Cardio: regular rate and rhythm GI: normal findings: bowel sounds normal and soft, non-tender Extremities: edema none and RUE PIC in place  Lab Results  Recent Labs  01/25/14 0420 01/25/14 1615  01/25/14 1633 01/26/14 0427  WBC 20.9* 22.4*  --  17.2*  HGB 9.7* 10.5* 10.5* 10.5*  HCT 27.1* 30.1* 31.0* 30.1*  NA 136*  --  135* 138  K 4.3  --  4.4 3.7  CL 101  --  102 98  CO2 23  --   --  29  BUN 29*  --  26* 28*  CREATININE 1.11 1.09 1.30 1.09   Liver Panel  Recent Labs  01/23/14 1615  PROT 7.5  ALBUMIN 2.3*  AST 40*  ALT 42  ALKPHOS 265*  BILITOT 0.3   Sedimentation Rate No results found for this basename: ESRSEDRATE,  in the last 72 hours C-Reactive Protein No results found for this basename: CRP,  in the last 72 hours  Microbiology: Recent Results (from the past 240 hour(s))  URINE CULTURE     Status: None   Collection Time    01/23/14  4:19 PM      Result Value Ref Range Status   Specimen Description URINE, CLEAN CATCH   Final   Special Requests NONE   Final   Culture  Setup Time     Final   Value: 01/24/2014 01:43     Performed at [place]     Final   Value: NO GROWTH     Performed at [facility]   Culture     Final   Value: NO GROWTH     Performed at [facility]   Report Status 01/24/2014 FINAL   Final  CULTURE, BLOOD (ROUTINE X 2)     Status: None   Collection Time    01/23/14  4:45 PM      Result Value Ref Range Status   Specimen Description BLOOD BLOOD RIGHT FOREARM   Final   Special Requests BOTTLES DRAWN AEROBIC AND ANAEROBIC 4ML   Final   Culture  Setup Time     Final   Value: 01/23/2014 21:33     Performed at [facility]   Culture     Final   Value: STREPTOCOCCUS PNEUMONIAE     Note: SUSCEPTIBILITIES PERFORMED ON PREVIOUS CULTURE WITHIN THE LAST 5 DAYS.     Note: Gram Stain Report Called to,Read Back By and Verified With: Lillia Dallas RN 01/24/14 11:20AM BY [place]     Performed at [facility]   Report Status 01/26/2014 FINAL   Final  CULTURE, BLOOD (ROUTINE X 2)     Status: None   Collection Time  01/23/14  4:47 PM      Result Value Ref Range Status   Specimen  Description BLOOD RIGHT ANTECUBITAL   Final   Special Requests BOTTLES DRAWN AEROBIC AND ANAEROBIC 4ML   Final   Culture  Setup Time     Final   Value: 01/23/2014 21:33     Performed at [facility]   Culture     Final   Value: STREPTOCOCCUS PNEUMONIAE     Note: Gram Stain Report Called to,Read Back By and Verified With: Lillia Dallas RN 01/23/14 11:20AM BY [place]     Performed at [facility]   Report Status 01/26/2014 FINAL   Final   Organism ID, Bacteria STREPTOCOCCUS PNEUMONIAE   Final  GRAM STAIN     Status: None   Collection Time    01/23/14  8:43 PM      Result Value Ref Range Status   Specimen Description FLUID   Final   Special Requests MEDIASTINAL FLUID   Final   Gram Stain     Final   Value: RARE WBC PRESENT,BOTH PMN AND MONONUCLEAR     NO ORGANISMS SEEN     CALLED TO C.YATES,RN 2036 01/23/14   Report Status 01/23/2014 FINAL   Final  BODY FLUID CULTURE     Status: None   Collection Time    01/23/14  8:43 PM      Result Value Ref Range Status   Specimen Description FLUID   Final   Special Requests MEDIASTINAL FLUID   Final   Gram Stain     Final   Value: RARE WBC PRESENT,BOTH PMN AND MONONUCLEAR     NO ORGANISMS SEEN     Gram Stain Report Called to,Read Back By and Verified With: Gram Stain Report Called to,Read Back By and Verified WithJule Economy RN 2036 01/23/14 Performed at [facility]     Performed at [facility]   Culture     Final   Value: FEW STREPTOCOCCUS PNEUMONIAE     Note: CRITICAL RESULT CALLED TO, READ BACK BY AND VERIFIED WITH: NIKKI POTTER@[email or web address]  ON [id number] BY [facility]     Performed at [facility]   Report Status 01/26/2014 FINAL   Final   Organism ID, Bacteria STREPTOCOCCUS PNEUMONIAE   Final  MRSA PCR SCREENING     Status: None   Collection Time    01/24/14  6:30 AM      Result Value Ref Range Status   MRSA by PCR NEGATIVE  NEGATIVE Final   Comment:            The GeneXpert MRSA Assay (FDA     approved for  NASAL specimens     only), is one component of a     comprehensive MRSA colonization     surveillance program. It is not     intended to diagnose MRSA     infection nor to guide or     monitor treatment for     MRSA infections.  CULTURE, BLOOD (ROUTINE X 2)     Status: None   Collection Time    01/25/14  3:00 PM      Result Value Ref Range Status   Specimen Description BLOOD RIGHT ARM   Final   Special Requests BOTTLES DRAWN AEROBIC AND ANAEROBIC 10CC   Final   Culture  Setup Time     Final   Value: 01/25/2014 19:53     Performed at [facility]  Culture     Final   Value:        BLOOD CULTURE RECEIVED NO GROWTH TO DATE CULTURE WILL BE HELD FOR 5 DAYS BEFORE ISSUING A FINAL NEGATIVE REPORT     Performed at [facility]   Report Status PENDING   Incomplete  CULTURE, BLOOD (ROUTINE X 2)     Status: None   Collection Time    01/25/14  3:15 PM      Result Value Ref Range Status   Specimen Description BLOOD LEFT ARM   Final   Special Requests BOTTLES DRAWN AEROBIC AND ANAEROBIC 10CC   Final   Culture  Setup Time     Final   Value: 01/25/2014 19:53     Performed at [facility]   Culture     Final   Value:        BLOOD CULTURE RECEIVED NO GROWTH TO DATE CULTURE WILL BE HELD FOR 5 DAYS BEFORE ISSUING A FINAL NEGATIVE REPORT     Performed at [facility]   Report Status PENDING   Incomplete    Studies/Results: Dg Chest Port 1 View  01/26/2014   CLINICAL DATA:  Postop repair of ruptured ascending thoracic aortic aneurysm  EXAM: PORTABLE CHEST - 1 VIEW  COMPARISON:  Portable chest x-ray of 01/25/2014  FINDINGS: Aeration of the left lung base has improved with decreasing basilar atelectasis. Bilateral chest tubes are noted and no pneumothorax is seen. The right IJ central venous line is unchanged in position, and cardiomegaly is stable.  IMPRESSION: Improved aeration with decrease in left basilar linear atelectasis. No definite pneumothorax is seen.    Electronically Signed   By: Ivar Drape M.D.   On: 01/26/2014 08:07   Dg Chest Portable 1 View In Am  01/25/2014   CLINICAL DATA:  Status post thoracic aortic aneurysm repair.  EXAM: PORTABLE CHEST - 1 VIEW  COMPARISON:  01/24/2014  FINDINGS: Right internal jugular central venous line, mediastinal tube and bilateral chest tubes are stable. Endotracheal tube and nasogastric tube have been removed.  Opacity at the left lung base mostly obscures hemidiaphragm. This is likely combination of atelectasis and a small pleural effusion. Mild atelectasis is noted at the right medial lung base in adjacent to the right chest tube.  Vascular congestion and interstitial densities seen previously appear improved. No overt pulmonary edema. No pneumothorax.  No mediastinal widening.  IMPRESSION: 1. Status post cardiothoracic surgery. No acute abnormality or evidence of an operative complication. 2. Persistent basilar atelectasis, greater on the left. Probable small left effusion. No overt pulmonary edema or pneumothorax. 3. Remaining support apparatus is well positioned.   Electronically Signed   By: Lajean Manes M.D.   On: 01/25/2014 07:57     Assessment/Plan: Pneumococcal Bacteremia  Endocarditis/Aortitis  Total days of antibiotics 3 (ceftriaxone/rifampin)  Repeat BCx pending Will need to f/u his LFTs on rifampin Doing well  Will discuss with house officers about submitting for case presentation          [facility] (pager) [phone] [email or web address] 01/26/2014, 11:17 AM  LOS: 3 days   **Disclaimer: This note may have been dictated with voice recognition software. Similar sounding words can inadvertently be transcribed and this note may contain transcription errors which may not have been corrected upon publication of note.**

## 2014-01-26 NOTE — Progress Notes (Signed)
Patient ID: Danny Frederick, male   DOB: [birth year], 80 y.o.   MRN: [id number] EVENING ROUNDS NOTE :     [address]       [place], [address]             [phone]                 3 Days Post-Op Procedure(s) (LRB): THORACIC ASCENDING ANEURYSM REPAIR (AAA) (N/A)  Total Length of Stay:  LOS: 3 days  BP 90/65  Pulse 81  Temp(Src) 97.5 F (36.4 C) (Oral)  Resp 19  Ht 5\' 3"  (1.6 m)  Wt 141 lb 12.1 oz (64.3 kg)  BMI 25.12 kg/m2  SpO2 97%  .Intake/Output     05/28 0701 - 05/29 0700   P.O. 100   I.V. (mL/kg) 140 (2.2)   Other    IV Piggyback 150   Total Intake(mL/kg) 390 (6.1)   Urine (mL/kg/hr) 2745 (3.2)   Chest Tube 230 (0.3)   Total Output 2975   Net -2585       Stool Occurrence 3 x     . sodium chloride 10 mL/hr (01/26/14 1900)  . furosemide (LASIX) infusion 10 mg/hr (01/26/14 1900)     Lab Results  Component Value Date   WBC 17.2* 01/26/2014   HGB 10.5* 01/26/2014   HCT 30.1* 01/26/2014   PLT 200 01/26/2014   GLUCOSE 111* 01/26/2014   CHOL 162 01/09/2012   TRIG 47 01/09/2012   HDL 60 01/09/2012   LDLCALC 93 01/09/2012   ALT 42 01/23/2014   AST 40* 01/23/2014   NA 138 01/26/2014   K 3.7 01/26/2014   CL 98 01/26/2014   CREATININE 1.09 01/26/2014   BUN 28* 01/26/2014   CO2 29 01/26/2014   TSH 3.214 12/08/2013   PSA 1.39 03/07/2013   INR 0.97 01/24/2014   Loose stools today, stool softners stopped, if more loose stools then check cdiff BP stable, ct drainage decreasing  Grace Isaac MD  Beeper [phone] Office [phone] 01/26/2014 8:28 PM

## 2014-01-27 ENCOUNTER — Inpatient Hospital Stay (HOSPITAL_COMMUNITY): Payer: Medicare Other

## 2014-01-27 ENCOUNTER — Ambulatory Visit: Payer: Medicare Other | Admitting: Licensed Clinical Social Worker

## 2014-01-27 LAB — COMPREHENSIVE METABOLIC PANEL
ALT: 31 U/L (ref 0–53)
AST: 42 U/L — AB (ref 0–37)
Albumin: 2.8 g/dL — ABNORMAL LOW (ref 3.5–5.2)
Alkaline Phosphatase: 139 U/L — ABNORMAL HIGH (ref 39–117)
BUN: 25 mg/dL — AB (ref 6–23)
CHLORIDE: 89 meq/L — AB (ref 96–112)
CO2: 30 mEq/L (ref 19–32)
CREATININE: 1 mg/dL (ref 0.50–1.35)
Calcium: 9 mg/dL (ref 8.4–10.5)
GFR calc Af Amer: 81 mL/min — ABNORMAL LOW (ref >90)
GFR calc non Af Amer: 70 mL/min — ABNORMAL LOW (ref >90)
Glucose, Bld: 112 mg/dL — ABNORMAL HIGH (ref 70–99)
POTASSIUM: 3.8 meq/L (ref 3.7–5.3)
Sodium: 136 mEq/L — ABNORMAL LOW (ref 137–147)
TOTAL PROTEIN: 6.3 g/dL (ref 6.0–8.3)
Total Bilirubin: 1.3 mg/dL — ABNORMAL HIGH (ref 0.3–1.2)

## 2014-01-27 LAB — PROTIME-INR
INR: 2.2 — AB (ref 0.00–1.49)
PROTHROMBIN TIME: 23.7 s — AB (ref 11.6–15.2)

## 2014-01-27 LAB — POCT I-STAT 3, ART BLOOD GAS (G3+)
Acid-Base Excess: 10 mmol/L — ABNORMAL HIGH (ref 0.0–2.0)
BICARBONATE: 32.6 meq/L — AB (ref 20.0–24.0)
O2 Saturation: 96 %
PCO2 ART: 37 mmHg (ref 35.0–45.0)
PO2 ART: 73 mmHg — AB (ref 80.0–100.0)
Patient temperature: 98.1
TCO2: 34 mmol/L (ref 0–100)
pH, Arterial: 7.551 — ABNORMAL HIGH (ref 7.350–7.450)

## 2014-01-27 LAB — CBC
HEMATOCRIT: 40.3 % (ref 39.0–52.0)
HEMOGLOBIN: 13.9 g/dL (ref 13.0–17.0)
MCH: 28.5 pg (ref 26.0–34.0)
MCHC: 34.5 g/dL (ref 30.0–36.0)
MCV: 82.8 fL (ref 78.0–100.0)
Platelets: 294 10*3/uL (ref 150–400)
RBC: 4.87 MIL/uL (ref 4.22–5.81)
RDW: 15 % (ref 11.5–15.5)
WBC: 27.1 10*3/uL — AB (ref 4.0–10.5)

## 2014-01-27 MED ORDER — SODIUM CHLORIDE 0.9 % IV SOLN
250.0000 mL | INTRAVENOUS | Status: DC | PRN
  Administered 2014-01-29: 250 mL via INTRAVENOUS

## 2014-01-27 MED ORDER — WARFARIN SODIUM 1 MG PO TABS
1.0000 mg | ORAL_TABLET | Freq: Every day | ORAL | Status: DC
  Filled 2014-01-27: qty 1

## 2014-01-27 MED ORDER — LISINOPRIL 10 MG PO TABS: 10.0000 mg | ORAL_TABLET | Freq: Every day | ORAL | Status: DC

## 2014-01-27 MED ORDER — SODIUM CHLORIDE 0.9 % IJ SOLN
3.0000 mL | Freq: Two times a day (BID) | INTRAMUSCULAR | Status: DC
  Administered 2014-01-28 – 2014-02-03 (×7): 3 mL via INTRAVENOUS

## 2014-01-27 MED ORDER — SODIUM CHLORIDE 0.9 % IJ SOLN: 3.0000 mL | INTRAMUSCULAR | Status: DC | PRN

## 2014-01-27 MED ORDER — LISINOPRIL 10 MG PO TABS
10.0000 mg | ORAL_TABLET | Freq: Every day | ORAL | Status: DC
  Filled 2014-01-27: qty 1

## 2014-01-27 MED ORDER — LORATADINE 10 MG PO TABS
10.0000 mg | ORAL_TABLET | Freq: Every day | ORAL | Status: DC
  Administered 2014-01-27 – 2014-02-04 (×9): 10 mg via ORAL
  Filled 2014-01-27 (×9): qty 1

## 2014-01-27 MED ORDER — MOVING RIGHT ALONG BOOK
Freq: Once | Status: AC
  Administered 2014-01-27: 11:00:00
  Filled 2014-01-27: qty 1

## 2014-01-27 NOTE — Significant Event (Signed)
Attempted to walk patient. Patient became unsteady with his feet and started having tremors on his hands. Tremors resided after patient sat back in the chair. Patient requested to go back to bed.

## 2014-01-27 NOTE — Progress Notes (Signed)
TCTS BRIEF SICU PROGRESS NOTE  4 Days Post-Op  S/P Procedure(s) (LRB): THORACIC ASCENDING ANEURYSM REPAIR (AAA) (N/A)   Overall stable day although BP marginal Diuresed nearly 8 liters last 48 hours, lasix stopped this am  Plan:  will hold Norvasc and decrease dose of lisinopril.  Give some fluids back if he develops symptomatic hypotension  Danny Frederick 01/27/2014 10:00 PM

## 2014-01-27 NOTE — Progress Notes (Signed)
INFECTIOUS DISEASE PROGRESS NOTE  ID: Danny Frederick is a 80 y.o. male with  Principal Problem:   Ruptured thoracic aortic aneurysm Active Problems:   Pulmonary artery thrombosis   Right ventricular dysfunction  Subjective: States he is confused, tired.   Abtx:  Anti-infectives   Start     Dose/Rate Route Frequency Ordered Stop   01/25/14 1500  rifampin (RIFADIN) capsule 300 mg     300 mg Oral Daily 01/25/14 1354     01/25/14 1500  cefTRIAXone (ROCEPHIN) 2 g in dextrose 5 % 50 mL IVPB     2 g 100 mL/hr over 30 Minutes Intravenous Every 24 hours 01/25/14 1354     01/24/14 1700  levofloxacin (LEVAQUIN) IVPB 750 mg  Status:  Discontinued     750 mg 100 mL/hr over 90 Minutes Intravenous Every 24 hours 01/24/14 1033 01/25/14 1354   01/24/14 1100  vancomycin (VANCOCIN) IVPB 1000 mg/200 mL premix  Status:  Discontinued     1,000 mg 200 mL/hr over 60 Minutes Intravenous  Once 01/24/14 0306 01/24/14 0934   01/24/14 1045  vancomycin (VANCOCIN) IVPB 1000 mg/200 mL premix  Status:  Discontinued     1,000 mg 200 mL/hr over 60 Minutes Intravenous Every 12 hours 01/24/14 1033 01/25/14 1354   01/24/14 0700  cefUROXime (ZINACEF) 1.5 g in dextrose 5 % 50 mL IVPB  Status:  Discontinued     1.5 g 100 mL/hr over 30 Minutes Intravenous Every 12 hours 01/24/14 0306 01/24/14 0934   01/23/14 2056  polymyxin B 500,000 Units, bacitracin 50,000 Units in sodium chloride irrigation 0.9 % 500 mL irrigation  Status:  Discontinued       As needed 01/23/14 2057 01/24/14 0306   01/23/14 1845  vancomycin (VANCOCIN) 1,250 mg in sodium chloride 0.9 % 250 mL IVPB     1,250 mg 166.7 mL/hr over 90 Minutes Intravenous To Surgery 01/23/14 1843 01/23/14 1936   01/23/14 1845  cefUROXime (ZINACEF) 1.5 g in dextrose 5 % 50 mL IVPB     1.5 g 100 mL/hr over 30 Minutes Intravenous To Surgery 01/23/14 1843 01/24/14 0035   01/23/14 1845  vancomycin (VANCOCIN) 1,000 mg in sodium chloride 0.9 % 1,000 mL irrigation     Irrigation To Surgery 01/23/14 1836 01/23/14 2022   01/23/14 1830  cefUROXime (ZINACEF) 750 mg in dextrose 5 % 50 mL IVPB  Status:  Discontinued     750 mg 100 mL/hr over 30 Minutes Intravenous To Surgery 01/23/14 1836 01/24/14 0306   01/23/14 1645  levofloxacin (LEVAQUIN) IVPB 750 mg     750 mg 100 mL/hr over 90 Minutes Intravenous  Once 01/23/14 1633 01/23/14 1822      Medications:  Scheduled: . acetaminophen  1,000 mg Oral 4 times per day  . amLODipine  2.5 mg Oral Daily  . antiseptic oral rinse  15 mL Mouth Rinse q12n4p  . aspirin EC  81 mg Oral Daily  . cefTRIAXone (ROCEPHIN)  IV  2 g Intravenous Q24H  . feeding supplement (ENSURE COMPLETE)  237 mL Oral BID BM  . levothyroxine  50 mcg Oral QAC breakfast  . lisinopril  30 mg Oral Daily  . loratadine  10 mg Oral Daily  . metoprolol tartrate  25 mg Oral BID  . pantoprazole  40 mg Oral Daily  . rifampin  300 mg Oral Daily  . sodium chloride  10-40 mL Intracatheter Q12H  . sodium chloride  10-40 mL Intracatheter Q12H  . sodium chloride  3 mL Intravenous Q12H  . sodium chloride  3 mL Intravenous Q12H  . Warfarin - Physician Dosing Inpatient   Does not apply q1800    Objective: Vital signs in last 24 hours: Temp:  [97.5 F (36.4 C)-98.1 F (36.7 C)] 97.8 F (36.6 C) (05/29 1136) Pulse Rate:  [54-96] 88 (05/29 0700) Resp:  [0-33] 27 (05/29 0700) BP: (79-145)/(54-89) 110/71 mmHg (05/29 0700) SpO2:  [80 %-100 %] 96 % (05/29 0700) Weight:  [59.285 kg (130 lb 11.2 oz)] 59.285 kg (130 lb 11.2 oz) (05/29 0500)   General appearance: alert, cooperative, fatigued and mild distress Resp: diminished breath sounds bilaterally and tachypneic Chest wall: wound clean superiorly Cardio: regular rate and rhythm GI: normal findings: bowel sounds normal and soft, non-tender Extremities: RUE PIC is clena.   Lab Results  Recent Labs  01/26/14 0427 01/27/14 0408 01/27/14 0650  WBC 17.2*  --  27.1*  HGB 10.5*  --  13.9  HCT 30.1*   --  40.3  NA 138 136*  --   K 3.7 3.8  --   CL 98 89*  --   CO2 29 30  --   BUN 28* 25*  --   CREATININE 1.09 1.00  --    Liver Panel  Recent Labs  01/27/14 0408  PROT 6.3  ALBUMIN 2.8*  AST 42*  ALT 31  ALKPHOS 139*  BILITOT 1.3*   Sedimentation Rate No results found for this basename: ESRSEDRATE,  in the last 72 hours C-Reactive Protein No results found for this basename: CRP,  in the last 72 hours  Microbiology: Recent Results (from the past 240 hour(s))  URINE CULTURE     Status: None   Collection Time    01/23/14  4:19 PM      Result Value Ref Range Status   Specimen Description URINE, CLEAN CATCH   Final   Special Requests NONE   Final   Culture  Setup Time     Final   Value: 01/24/2014 01:43     Performed at [place]     Final   Value: NO GROWTH     Performed at [facility]   Culture     Final   Value: NO GROWTH     Performed at [facility]   Report Status 01/24/2014 FINAL   Final  CULTURE, BLOOD (ROUTINE X 2)     Status: None   Collection Time    01/23/14  4:45 PM      Result Value Ref Range Status   Specimen Description BLOOD BLOOD RIGHT FOREARM   Final   Special Requests BOTTLES DRAWN AEROBIC AND ANAEROBIC 4ML   Final   Culture  Setup Time     Final   Value: 01/23/2014 21:33     Performed at [facility]   Culture     Final   Value: STREPTOCOCCUS PNEUMONIAE     Note: SUSCEPTIBILITIES PERFORMED ON PREVIOUS CULTURE WITHIN THE LAST 5 DAYS.     Note: Gram Stain Report Called to,Read Back By and Verified With: Lillia Dallas RN 01/24/14 11:20AM BY [place]     Performed at [facility]   Report Status 01/26/2014 FINAL   Final  CULTURE, BLOOD (ROUTINE X 2)     Status: None   Collection Time    01/23/14  4:47 PM      Result Value Ref Range Status   Specimen Description BLOOD RIGHT ANTECUBITAL  Final   Special Requests BOTTLES DRAWN AEROBIC AND ANAEROBIC 4ML   Final   Culture  Setup Time      Final   Value: 01/23/2014 21:33     Performed at [facility]   Culture     Final   Value: STREPTOCOCCUS PNEUMONIAE     Note: Gram Stain Report Called to,Read Back By and Verified With: Lillia Dallas RN 01/23/14 11:20AM BY [place]     Performed at [facility]   Report Status 01/26/2014 FINAL   Final   Organism ID, Bacteria STREPTOCOCCUS PNEUMONIAE   Final  GRAM STAIN     Status: None   Collection Time    01/23/14  8:43 PM      Result Value Ref Range Status   Specimen Description FLUID   Final   Special Requests MEDIASTINAL FLUID   Final   Gram Stain     Final   Value: RARE WBC PRESENT,BOTH PMN AND MONONUCLEAR     NO ORGANISMS SEEN     CALLED TO C.YATES,RN 2036 01/23/14   Report Status 01/23/2014 FINAL   Final  BODY FLUID CULTURE     Status: None   Collection Time    01/23/14  8:43 PM      Result Value Ref Range Status   Specimen Description FLUID   Final   Special Requests MEDIASTINAL FLUID   Final   Gram Stain     Final   Value: RARE WBC PRESENT,BOTH PMN AND MONONUCLEAR     NO ORGANISMS SEEN     Gram Stain Report Called to,Read Back By and Verified With: Gram Stain Report Called to,Read Back By and Verified WithJule Economy RN 2036 01/23/14 Performed at [facility]     Performed at [facility]   Culture     Final   Value: FEW STREPTOCOCCUS PNEUMONIAE     Note: CRITICAL RESULT CALLED TO, READ BACK BY AND VERIFIED WITH: NIKKI POTTER@1319  ON [id number] BY [facility]     Performed at [facility]   Report Status 01/26/2014 FINAL   Final   Organism ID, Bacteria STREPTOCOCCUS PNEUMONIAE   Final  MRSA PCR SCREENING     Status: None   Collection Time    01/24/14  6:30 AM      Result Value Ref Range Status   MRSA by PCR NEGATIVE  NEGATIVE Final   Comment:            The GeneXpert MRSA Assay (FDA     approved for NASAL specimens     only), is one component of a     comprehensive MRSA colonization     surveillance program. It is not     intended to  diagnose MRSA     infection nor to guide or     monitor treatment for     MRSA infections.  CULTURE, BLOOD (ROUTINE X 2)     Status: None   Collection Time    01/25/14  3:00 PM      Result Value Ref Range Status   Specimen Description BLOOD RIGHT ARM   Final   Special Requests BOTTLES DRAWN AEROBIC AND ANAEROBIC 10CC   Final   Culture  Setup Time     Final   Value: 01/25/2014 19:53     Performed at [facility]   Culture     Final   Value:        BLOOD CULTURE RECEIVED NO GROWTH  TO DATE CULTURE WILL BE HELD FOR 5 DAYS BEFORE ISSUING A FINAL NEGATIVE REPORT     Performed at [facility]   Report Status PENDING   Incomplete  CULTURE, BLOOD (ROUTINE X 2)     Status: None   Collection Time    01/25/14  3:15 PM      Result Value Ref Range Status   Specimen Description BLOOD LEFT ARM   Final   Special Requests BOTTLES DRAWN AEROBIC AND ANAEROBIC 10CC   Final   Culture  Setup Time     Final   Value: 01/25/2014 19:53     Performed at [facility]   Culture     Final   Value:        BLOOD CULTURE RECEIVED NO GROWTH TO DATE CULTURE WILL BE HELD FOR 5 DAYS BEFORE ISSUING A FINAL NEGATIVE REPORT     Performed at [facility]   Report Status PENDING   Incomplete    Studies/Results: Dg Chest Port 1 View  01/27/2014   CLINICAL DATA:  Postop from repair of ruptured thoracic aortic pseudoaneurysm.  EXAM: PORTABLE CHEST - 1 VIEW  COMPARISON:  01/26/2014  FINDINGS: Bilateral chest tubes and mediastinal drains remain in place and no pneumothorax is visualized. A new right arm PICC line is seen with tip overlying the superior cavoatrial junction.  Mild atelectasis persists at the left lung base. Lungs are otherwise clear. Cardiomegaly stable. No evidence of congestive heart failure.  IMPRESSION: Stable mild left basilar atelectasis in cardiomegaly. No pneumothorax visualized.  New right arm PICC line in appropriate position.   Electronically Signed   By: Earle Gell M.D.    On: 01/27/2014 08:12   Dg Chest Port 1 View  01/26/2014   CLINICAL DATA:  Postop repair of ruptured ascending thoracic aortic aneurysm  EXAM: PORTABLE CHEST - 1 VIEW  COMPARISON:  Portable chest x-ray of 01/25/2014  FINDINGS: Aeration of the left lung base has improved with decreasing basilar atelectasis. Bilateral chest tubes are noted and no pneumothorax is seen. The right IJ central venous line is unchanged in position, and cardiomegaly is stable.  IMPRESSION: Improved aeration with decrease in left basilar linear atelectasis. No definite pneumothorax is seen.   Electronically Signed   By: Ivar Drape M.D.   On: 01/26/2014 08:07     Assessment/Plan: Pneumococcal Bacteremia  Endocarditis/Aortitis  ? Sundowning  Total days of antibiotics 4 (ceftriaxone/rifampin)  Await CT head No change in anbx  Repeat BCx ngtd WBC up today, afebrile ID will check in over w/e         [facility] (pager) [phone] [email or web address] 01/27/2014, 11:57 AM  LOS: 4 days   **Disclaimer: This note may have been dictated with voice recognition software. Similar sounding words can inadvertently be transcribed and this note may contain transcription errors which may not have been corrected upon publication of note.**

## 2014-01-27 NOTE — Progress Notes (Signed)
Called by primary RN to see pt with sudden change in neuro status after standing.  Per report pt was assisted to standing position then tightened up & became briefly unresponsive with unequal pupils.  Pt was placed back in bed promptly.  Staff denies any signs of seizure activity.  On assessment pt is alert & responsive but confused about time, month & year.  Pt MAEE with LUE ataxia.  No aphasia noted but mild dysarthria present.  No sensory deficit.  Pt has visual impairment in Lt eye.  Primary RN contacted Dr. Servando Snare.

## 2014-01-27 NOTE — Care Management Note (Addendum)
    Page 1 of 2   02/04/2014     11:19:44 AM CARE MANAGEMENT NOTE 02/04/2014  Patient:  Danny Frederick, Danny Frederick   Account Number:  [id number]  Date Initiated:  01/24/2014  Documentation initiated by:  MAYO,HENRIETTA  Subjective/Objective Assessment:   dx ruptured thoracic aortic aneurysm s/p repair; lives with spouse, has rolling walker and 3-N-1  PCP Rita Ohara     Action/Plan:   Anticipated DC Date:  02/02/2014   Anticipated DC Plan:  [place]  CM consult      [facility] Choice  HOME HEALTH   Choice offered to / List presented to:  C-3 Spouse   DME arranged  IV PUMP/EQUIPMENT      DME agency  [facility] arranged  HH-1 RN  [place].   Status of service:  Completed, signed off Medicare Important Message given?  YES (If response is "NO", the following Medicare IM given date fields will be blank) Date Medicare IM given:  01/31/2014 Date Additional Medicare IM given:    Discharge Disposition:  [place]  Per UR Regulation:  Reviewed for med. necessity/level of care/duration of stay  If discussed at [place] of Stay Meetings, dates discussed:   01/31/2014    Comments:  02/04/14 11:18 CM received call from RN to add on [place].  CM texted [facility] rep, Winnie of add on.  No other CM needs were communicated.  Mariane Masters, BSN, CM [phone]. 10:29 CM received call from RN inquiring as to whether she should run the IV ABX, Rocephin, prior to the pt going home today.  CM confirmed with [facility] rep, Winnie, the RN should run the Rocephine as [facility] is scheduled for tomorrow, 02/05/14. CM relayed message to RN.  No other CM needs were communicated. Mariane Masters, BSN, [place].  01/31/14 Ellan Lambert, RN, BSN [phone] Met with pt, dtr and wife to finalize dc plans.  Pt will dc to daughter's home initially at dc: dc address:  [address], [place], [place] [address]             Daughter, Marcelene [place], cell # [phone] (best contact #) Wife, daughter and granddaughter can assist with care at dc.  Will add HHPT, as pt weak and deconditioned; needs therapy.  Pt has all needed DME at home, per daughter. Will follow.  01/27/14 1205 [place] MSN BSN CCM Pt will need long-term IV antibx, spouse has good support from dtr and plan is to return home with home health. provided list of agencies, referral made per choice.

## 2014-01-27 NOTE — Progress Notes (Signed)
At 0440 this am, RN and NT stood pt up at side of bed and had pt step on scale. Pt was able to do this with minimal assistance. After taking pt's weight pt tightly grabbed bar on scale and head rolled back, pts body became very rigid and would no longer respond to verbal commands. Pt was placed back in the bed, HR NSR 95, SPO2 96% on room air, RR 28, BP 116/87 (92). After placing pt in bed pt's body quickly relaxed and he responded to verbal stimuli and was able to answer questions. Pt was confused on time and date, but was able to remember standing on the scale and his weight. The Rapid Response/Stroke RN was called to evaluate the pt (see her note). This RN evaluated the pt and documented the assessment. Pt has equal grips bilaterally and equal dorsi/plantar flexion. L pupil 33mm and R pupil 3 mm. Both brisk & reactive but unequal. MD Roxy Horseman called and situation and assessment was given.  Orders received for ABG to be drawn.

## 2014-01-27 NOTE — Progress Notes (Addendum)
[address]       [place] [address]             [phone]      4 Days Post-Op Procedure(s) (LRB): THORACIC ASCENDING ANEURYSM REPAIR (AAA) (N/A)  Subjective:  Danny Frederick states he is feeling off today.  He has some mild expressive aphasia.  There is also some weakness in his left lower extremity.  He had episodes of confusion, brief episode of unconsciousness upon standing and suicidal thoughts overnight.  He is alert to person, place and time this morning.  Objective: Vital signs in last 24 hours: Temp:  [97.3 F (36.3 C)-98.1 F (36.7 C)] 97.5 F (36.4 C) (05/29 0743) Pulse Rate:  [54-96] 88 (05/29 0700) Cardiac Rhythm:  [-] Normal sinus rhythm (05/29 0600) Resp:  [0-33] 27 (05/29 0700) BP: (79-145)/(54-89) 110/71 mmHg (05/29 0700) SpO2:  [80 %-100 %] 96 % (05/29 0700) Weight:  [130 lb 11.2 oz (59.285 kg)] 130 lb 11.2 oz (59.285 kg) (05/29 0500)  Intake/Output from previous day: 05/28 0701 - 05/29 0700 In: 610 [P.O.:100; I.V.:360; IV Piggyback:150] Out: 7989 [Urine:3920; Chest Tube:370] Intake/Output this shift: Total I/O In: -  Out: 100 [Urine:100]  General appearance: alert, cooperative and no distress Heart: regular rate and rhythm Lungs: clear to auscultation bilaterally Abdomen: soft, non-tender; bowel sounds normal; no masses,  no organomegaly Extremities: edema trace Wound: clean and dry  Lab Results:  Recent Labs  01/26/14 0427 01/27/14 0650  WBC 17.2* PENDING  HGB 10.5* 13.9  HCT 30.1* 40.3  PLT 200 294   BMET:  Recent Labs  01/26/14 0427 01/27/14 0408  NA 138 136*  K 3.7 3.8  CL 98 89*  CO2 29 30  GLUCOSE 111* 112*  BUN 28* 25*  CREATININE 1.09 1.00  CALCIUM 8.6 9.0    PT/INR:  Recent Labs  01/27/14 0408  LABPROT 23.7*  INR 2.20*   ABG    Component Value Date/Time   PHART 7.551* 01/27/2014 0509   HCO3 32.6* 01/27/2014 0509   TCO2 34 01/27/2014 0509   ACIDBASEDEF 1.0 01/24/2014 1925   O2SAT 96.0  01/27/2014 0509   CBG (last 3)   Recent Labs  01/25/14 2357 01/26/14 0345 01/26/14 0752  GLUCAP 115* 132* 93    Assessment/Plan: S/P Procedure(s) (LRB): THORACIC ASCENDING ANEURYSM REPAIR (AAA) (N/A)  1. CV- NSR good rate and pressure control- continue Lopressor, Norvasc, Lisinopril 2. Pulm- off oxygen, CXR looks good no pleural effusions present, minimal atelectasis- CT output down to 370 cc 3. Renal-creatinine WNL, weight is at baseline, no significant edema on exam- remains on Lasix gtt at 12ml/hr- may benefit from discontinuation of drip and start oral regimen  4. Neuro- episode of loss of conciousness vs seizure overnight upon standing- mild expressive aphasia this morning, UE strength appears to be equal, there is a decrease in strength in the his LLE, may benefit from head CT 5. ID- + streptococcal sepsis from endocarditis- on Rocephin/Rifampin, ID following 6. INR 2.2- 7. Dispo- patient with delirium at night, episode of syncope vs seizure overnight ? Head CT, continue current care   LOS: 4 days    Danny Frederick 01/27/2014  I have seen and examined the patient and agree with the assessment and plan as outlined.  Near syncopal episode while standing up overnight.  I suspect this was due to  Orthostatic drop in BP but will get CT head to r/o stroke.  Volume overload has resolved.  Stop lasix drip.  Hold coumadin since INR jumped up so quickly.  WBC up today - will watch.  D/C chest tubes.   Mobilize.  Keep in ICU at least 1 more day.  Danny Frederick 01/27/2014 9:04 AM

## 2014-01-28 ENCOUNTER — Inpatient Hospital Stay (HOSPITAL_COMMUNITY): Payer: Medicare Other

## 2014-01-28 LAB — CLOSTRIDIUM DIFFICILE BY PCR: CDIFFPCR: NEGATIVE

## 2014-01-28 LAB — BASIC METABOLIC PANEL
BUN: 34 mg/dL — AB (ref 6–23)
CO2: 32 mEq/L (ref 19–32)
CREATININE: 0.88 mg/dL (ref 0.50–1.35)
Calcium: 8.6 mg/dL (ref 8.4–10.5)
Chloride: 92 mEq/L — ABNORMAL LOW (ref 96–112)
GFR calc Af Amer: >90 mL/min (ref >90)
GFR, EST NON AFRICAN AMERICAN: 80 mL/min — AB (ref >90)
GLUCOSE: 105 mg/dL — AB (ref 70–99)
POTASSIUM: 3.3 meq/L — AB (ref 3.7–5.3)
Sodium: 136 mEq/L — ABNORMAL LOW (ref 137–147)

## 2014-01-28 LAB — CBC
HCT: 40.1 % (ref 39.0–52.0)
Hemoglobin: 13.8 g/dL (ref 13.0–17.0)
MCH: 28.7 pg (ref 26.0–34.0)
MCHC: 34.4 g/dL (ref 30.0–36.0)
MCV: 83.4 fL (ref 78.0–100.0)
PLATELETS: 318 10*3/uL (ref 150–400)
RBC: 4.81 MIL/uL (ref 4.22–5.81)
RDW: 15.5 % (ref 11.5–15.5)
WBC: 40.1 10*3/uL — ABNORMAL HIGH (ref 4.0–10.5)

## 2014-01-28 LAB — PROTIME-INR
INR: 2.11 — ABNORMAL HIGH (ref 0.00–1.49)
PROTHROMBIN TIME: 23 s — AB (ref 11.6–15.2)

## 2014-01-28 MED ORDER — ALBUMIN HUMAN 5 % IV SOLN
12.5000 g | Freq: Once | INTRAVENOUS | Status: AC
  Administered 2014-01-28: 12.5 g via INTRAVENOUS

## 2014-01-28 MED ORDER — POTASSIUM CHLORIDE 10 MEQ/50ML IV SOLN
10.0000 meq | INTRAVENOUS | Status: AC
  Administered 2014-01-28 (×3): 10 meq via INTRAVENOUS
  Filled 2014-01-28: qty 50

## 2014-01-28 MED ORDER — GERHARDT'S BUTT CREAM
TOPICAL_CREAM | CUTANEOUS | Status: DC | PRN
  Filled 2014-01-28: qty 1

## 2014-01-28 MED ORDER — BOOST / RESOURCE BREEZE PO LIQD
1.0000 | Freq: Two times a day (BID) | ORAL | Status: DC
  Administered 2014-01-28 – 2014-02-01 (×6): 1 via ORAL

## 2014-01-28 MED ORDER — ALBUMIN HUMAN 5 % IV SOLN
INTRAVENOUS | Status: AC
  Administered 2014-01-28: 12.5 g via INTRAVENOUS
  Filled 2014-01-28: qty 250

## 2014-01-28 NOTE — Plan of Care (Signed)
Problem: Phase I - Pre-Op Goal: Point person for discharge identified Outcome: Completed/Met Date Met:  01/28/14 spouse

## 2014-01-28 NOTE — Progress Notes (Signed)
Sleeping at present  BP 119/71  Pulse 93  Temp(Src) 97.8 F (36.6 C) (Oral)  Resp 15  Ht 5\' 3"  (1.6 m)  Wt 126 lb 12.2 oz (57.5 kg)  BMI 22.46 kg/m2  SpO2 95%   Intake/Output Summary (Last 24 hours) at 01/28/14 1834 Last data filed at 01/28/14 1800  Gross per 24 hour  Intake    420 ml  Output    325 ml  Net     95 ml    BP better after albumin this AM

## 2014-01-28 NOTE — Progress Notes (Signed)
5 Days Post-Op Procedure(s) (LRB): THORACIC ASCENDING ANEURYSM REPAIR (AAA) (N/A) Subjective: No complaints this AM Diarrhea BP low overnight  Objective: Vital signs in last 24 hours: Temp:  [97.8 F (36.6 C)-98 F (36.7 C)] 97.9 F (36.6 C) (05/29 2354) Pulse Rate:  [31-102] 95 (05/30 0730) Cardiac Rhythm:  [-] Sinus tachycardia (05/30 0700) Resp:  [15-35] 28 (05/30 0730) BP: (74-112)/(51-78) 74/51 mmHg (05/30 0730) SpO2:  [94 %-100 %] 100 % (05/30 0730) Weight:  [126 lb 12.2 oz (57.5 kg)] 126 lb 12.2 oz (57.5 kg) (05/30 0500)  Hemodynamic parameters for last 24 hours:    Intake/Output from previous day: 05/29 0701 - 05/30 0700 In: 170 [I.V.:120; IV Piggyback:50] Out: 550 [Urine:550] Intake/Output this shift:    General appearance: alert and no distress Neurologic: no focal motor deficit Heart: regular rate and rhythm Lungs: clear to auscultation bilaterally Abdomen: normal findings: soft, non-tender Wound: clean and dry  Lab Results:  Recent Labs  01/27/14 0650 01/28/14 0445  WBC 27.1* 40.1*  HGB 13.9 13.8  HCT 40.3 40.1  PLT 294 318   BMET:  Recent Labs  01/27/14 0408 01/28/14 0445  NA 136* 136*  K 3.8 3.3*  CL 89* 92*  CO2 30 32  GLUCOSE 112* 105*  BUN 25* 34*  CREATININE 1.00 0.88  CALCIUM 9.0 8.6    PT/INR:  Recent Labs  01/28/14 0445  LABPROT 23.0*  INR 2.11*   ABG    Component Value Date/Time   PHART 7.551* 01/27/2014 0509   HCO3 32.6* 01/27/2014 0509   TCO2 34 01/27/2014 0509   ACIDBASEDEF 1.0 01/24/2014 1925   O2SAT 96.0 01/27/2014 0509   CBG (last 3)   Recent Labs  01/25/14 2357 01/26/14 0345 01/26/14 0752  GLUCAP 115* 132* 93    Assessment/Plan: S/P Procedure(s) (LRB): THORACIC ASCENDING ANEURYSM REPAIR (AAA) (N/A) - CV- hypotensive this AM, suspect most due to dehydration, was aggressively diuresed postop  Will give albumin and hold BP meds this AM  RESP- lungs clear  RENAL- creatinine OK  Hypokalemia-  supplement K  GI- diarrhea- check c diff  ID- on ceftriaxone for strep- concerning that his WBC is up to 40K this am  No other evident source of infection other than possibly c diff  NEURO- head CT yesterday unremarkable, alert and appropriate this AM   LOS: 5 days    [place] Nakayama 01/28/2014

## 2014-01-28 NOTE — Progress Notes (Signed)
PT Cancellation Note  Patient Details Name: Danny Frederick MRN: [id number] DOB: 12-20-1933   Cancelled Treatment:    Reason Eval/Treat Not Completed: Medical issues which prohibited therapy.  Pt with low blood pressures all am per nursing. They had to get pt back in bed as pressures too low.  Will HOLD therapy and return at later date.  Thanks.   [facility] 01/28/2014, 9:08 AM [facility] [phone] (pager)

## 2014-01-29 ENCOUNTER — Inpatient Hospital Stay (HOSPITAL_COMMUNITY): Payer: Medicare Other

## 2014-01-29 LAB — CBC
HCT: 38.9 % — ABNORMAL LOW (ref 39.0–52.0)
Hemoglobin: 13.2 g/dL (ref 13.0–17.0)
MCH: 28.8 pg (ref 26.0–34.0)
MCHC: 33.9 g/dL (ref 30.0–36.0)
MCV: 84.7 fL (ref 78.0–100.0)
PLATELETS: 341 10*3/uL (ref 150–400)
RBC: 4.59 MIL/uL (ref 4.22–5.81)
RDW: 15.7 % — AB (ref 11.5–15.5)
WBC: 28.3 10*3/uL — AB (ref 4.0–10.5)

## 2014-01-29 LAB — BASIC METABOLIC PANEL
BUN: 34 mg/dL — ABNORMAL HIGH (ref 6–23)
CALCIUM: 8.8 mg/dL (ref 8.4–10.5)
CO2: 32 mEq/L (ref 19–32)
Chloride: 93 mEq/L — ABNORMAL LOW (ref 96–112)
Creatinine, Ser: 0.82 mg/dL (ref 0.50–1.35)
GFR calc non Af Amer: 83 mL/min — ABNORMAL LOW (ref >90)
Glucose, Bld: 88 mg/dL (ref 70–99)
Potassium: 3.6 mEq/L — ABNORMAL LOW (ref 3.7–5.3)
SODIUM: 135 meq/L — AB (ref 137–147)

## 2014-01-29 LAB — PROTIME-INR
INR: 2.34 — ABNORMAL HIGH (ref 0.00–1.49)
PROTHROMBIN TIME: 24.9 s — AB (ref 11.6–15.2)

## 2014-01-29 MED ORDER — SODIUM CHLORIDE 0.9 % IV SOLN
INTRAVENOUS | Status: DC
  Administered 2014-01-29: 08:00:00 via INTRAVENOUS

## 2014-01-29 MED ORDER — POTASSIUM CHLORIDE 10 MEQ/50ML IV SOLN
10.0000 meq | INTRAVENOUS | Status: AC
  Administered 2014-01-29 (×3): 10 meq via INTRAVENOUS
  Filled 2014-01-29: qty 50

## 2014-01-29 NOTE — Progress Notes (Signed)
Asleep at present  BP 124/84  Pulse 96  Temp(Src) 97.8 F (36.6 C) (Oral)  Resp 27  Ht 5\' 3"  (1.6 m)  Wt 122 lb 5.7 oz (55.5 kg)  BMI 21.68 kg/m2  SpO2 98%   Intake/Output Summary (Last 24 hours) at 01/29/14 1959 Last data filed at 01/29/14 1700  Gross per 24 hour  Intake 1011.67 ml  Output    850 ml  Net 161.67 ml    Stable day  Recheck WBC in AM

## 2014-01-29 NOTE — Progress Notes (Signed)
6 Days Post-Op Procedure(s) (LRB): THORACIC ASCENDING ANEURYSM REPAIR (AAA) (N/A) Subjective: Some nausea this AM Appetite poor  Objective: Vital signs in last 24 hours: Temp:  [97.8 F (36.6 C)-98.2 F (36.8 C)] 98.1 F (36.7 C) (05/31 0047) Pulse Rate:  [91-113] 113 (05/31 0700) Cardiac Rhythm:  [-] Normal sinus rhythm (05/31 0600) Resp:  [12-31] 28 (05/31 0700) BP: (86-141)/(53-91) 88/59 mmHg (05/31 0700) SpO2:  [93 %-99 %] 95 % (05/31 0700) Weight:  [122 lb 5.7 oz (55.5 kg)] 122 lb 5.7 oz (55.5 kg) (05/31 0640)  Hemodynamic parameters for last 24 hours:    Intake/Output from previous day: 05/30 0701 - 05/31 0700 In: 855 [P.O.:440; I.V.:115; IV Piggyback:300] Out: 450 [Urine:450] Intake/Output this shift:    General appearance: alert and no distress Neurologic: intact Heart: regular rate and rhythm Lungs: diminished breath sounds bibasilar Abdomen: normal findings: soft, non-tender  Lab Results:  Recent Labs  01/28/14 0445 01/29/14 0433  WBC 40.1* 28.3*  HGB 13.8 13.2  HCT 40.1 38.9*  PLT 318 341   BMET:  Recent Labs  01/28/14 0445 01/29/14 0433  NA 136* 135*  K 3.3* 3.6*  CL 92* 93*  CO2 32 32  GLUCOSE 105* 88  BUN 34* 34*  CREATININE 0.88 0.82  CALCIUM 8.6 8.8    PT/INR:  Recent Labs  01/29/14 0433  LABPROT 24.9*  INR 2.34*   ABG    Component Value Date/Time   PHART 7.551* 01/27/2014 0509   HCO3 32.6* 01/27/2014 0509   TCO2 34 01/27/2014 0509   ACIDBASEDEF 1.0 01/24/2014 1925   O2SAT 96.0 01/27/2014 0509   CBG (last 3)  No results found for this basename: GLUCAP,  in the last 72 hours  Assessment/Plan: S/P Procedure(s) (LRB): THORACIC ASCENDING ANEURYSM REPAIR (AAA) (N/A) - CV- BP dropped when he got OOB- orthostatic hypotension- PO intake poor  Will give 250 ml saline bolus then start 50 ml/hr until PO improves  RESP- stable  RENAL- supplement K for hypokalemia  Creatinine stable  ID- on ceftriaxone and rifampin for strep-  WBC down from 40 to 28K  Mobilize as tolerated  INR still elevated- hold coumadin   LOS: 6 days    Danny Frederick 01/29/2014

## 2014-01-30 ENCOUNTER — Inpatient Hospital Stay (HOSPITAL_COMMUNITY): Payer: Medicare Other

## 2014-01-30 LAB — CBC
HCT: 34.1 % — ABNORMAL LOW (ref 39.0–52.0)
Hemoglobin: 11.4 g/dL — ABNORMAL LOW (ref 13.0–17.0)
MCH: 28.1 pg (ref 26.0–34.0)
MCHC: 33.4 g/dL (ref 30.0–36.0)
MCV: 84.2 fL (ref 78.0–100.0)
PLATELETS: 329 10*3/uL (ref 150–400)
RBC: 4.05 MIL/uL — AB (ref 4.22–5.81)
RDW: 15.9 % — ABNORMAL HIGH (ref 11.5–15.5)
WBC: 17.5 10*3/uL — AB (ref 4.0–10.5)

## 2014-01-30 LAB — BASIC METABOLIC PANEL
BUN: 26 mg/dL — ABNORMAL HIGH (ref 6–23)
CHLORIDE: 98 meq/L (ref 96–112)
CO2: 26 mEq/L (ref 19–32)
Calcium: 8 mg/dL — ABNORMAL LOW (ref 8.4–10.5)
Creatinine, Ser: 0.65 mg/dL (ref 0.50–1.35)
GFR calc non Af Amer: >90 mL/min (ref >90)
Glucose, Bld: 75 mg/dL (ref 70–99)
POTASSIUM: 3.6 meq/L — AB (ref 3.7–5.3)
SODIUM: 137 meq/L (ref 137–147)

## 2014-01-30 LAB — PROTIME-INR
INR: 2.18 — AB (ref 0.00–1.49)
Prothrombin Time: 23.6 seconds — ABNORMAL HIGH (ref 11.6–15.2)

## 2014-01-30 MED ORDER — POTASSIUM CHLORIDE 10 MEQ/50ML IV SOLN
10.0000 meq | INTRAVENOUS | Status: AC
  Administered 2014-01-30 (×3): 10 meq via INTRAVENOUS

## 2014-01-30 MED ORDER — CITALOPRAM HYDROBROMIDE 10 MG PO TABS
10.0000 mg | ORAL_TABLET | Freq: Every day | ORAL | Status: DC
  Administered 2014-01-30 – 2014-02-03 (×5): 10 mg via ORAL
  Filled 2014-01-30 (×6): qty 1

## 2014-01-30 NOTE — Evaluation (Signed)
Physical Therapy Evaluation Patient Details Name: Danny Frederick MRN: [id number] DOB: March 07, 1934 Today's Date: 01/30/2014   History of Present Illness  Pt adm with ruptured thoracic aortic aneurysm and underwent repair. PMH-lumbar fusion ~9 months ago, HTN  Clinical Impression  Pt admitted with above. Pt currently with functional limitations due to the deficits listed below (see PT Problem List).  Pt will benefit from skilled PT to increase their independence and safety with mobility to allow discharge home with family. Today pt reports extreme fatigue which limited activity. Checked BP's and pt not orthostatic.     Follow Up Recommendations Supervision - Intermittent;Home health PT    Equipment Recommendations  None recommended by PT    Recommendations for Other Services       Precautions / Restrictions Precautions Precautions: Sternal;Fall Restrictions Weight Bearing Restrictions:  (sternal precautions)      Mobility  Bed Mobility Overal bed mobility: Needs Assistance Bed Mobility: Supine to Sit;Sit to Supine     Supine to sit: Min assist Sit to supine: Min assist   General bed mobility comments: Verbal cues for technique and assist for trunk.  Transfers Overall transfer level: Needs assistance Equipment used: Rolling walker (2 wheeled) Transfers: Sit to/from Stand Sit to Stand: Min assist         General transfer comment: Verbal cues for technique of placing hands on knees to remember sternal precautions. Assist for stability.  Ambulation/Gait Ambulation/Gait assistance: Min guard Ambulation Distance (Feet): 3 Feet Assistive device: Rolling walker (2 wheeled) Gait Pattern/deviations: Step-to pattern     General Gait Details: Side -stepped up side of bed. Assist for stability.  Stairs            Wheelchair Mobility    Modified Rankin (Stroke Patients Only)       Balance Overall balance assessment: Needs assistance Sitting-balance support: No  upper extremity supported;Feet supported Sitting balance-Leahy Scale: Good Sitting balance - Comments: Sat EOB x 10 minutes.   Standing balance support: Bilateral upper extremity supported Standing balance-Leahy Scale: Poor Standing balance comment: Using walker for support.                             Pertinent Vitals/Pain See flow sheet.    Home Living Family/patient expects to be discharged to:: Private residence Living Arrangements: Spouse/significant other Available Help at Discharge: Family;Available 24 hours/day Type of Home: House Home Access: Stairs to enter Entrance Stairs-Rails: None Entrance Stairs-Number of Steps: 1 Home Layout: Two level;Bed/bath upstairs Home Equipment: Walker - 2 wheels Additional Comments: Pt may go to daughters house at dc that has bed/bath on ground level.    Prior Function Level of Independence: Independent               Hand Dominance   Dominant Hand: Right    Extremity/Trunk Assessment   Upper Extremity Assessment: Generalized weakness           Lower Extremity Assessment: Generalized weakness         Communication   Communication: No difficulties  Cognition Arousal/Alertness: Awake/alert Behavior During Therapy: WFL for tasks assessed/performed Overall Cognitive Status: Within Functional Limits for tasks assessed                      General Comments      Exercises        Assessment/Plan    PT Assessment Patient needs continued PT services  PT Diagnosis Difficulty walking;Generalized weakness  PT Problem List Decreased strength;Decreased activity tolerance;Decreased balance;Decreased mobility;Decreased knowledge of precautions;Decreased knowledge of use of DME  PT Treatment Interventions DME instruction;Gait training;Stair training;Functional mobility training;Therapeutic activities;Therapeutic exercise;Patient/family education;Balance training   PT Goals (Current goals can be found  in the Care Plan section) Acute Rehab PT Goals Patient Stated Goal: return home PT Goal Formulation: With patient Time For Goal Achievement: 02/06/14 Potential to Achieve Goals: Good    Frequency Min 3X/week   Barriers to discharge        Co-evaluation               End of Session Equipment Utilized During Treatment: Gait belt Activity Tolerance: Patient limited by fatigue Patient left: in bed;with call bell/phone within reach;with family/visitor present Nurse Communication: Mobility status         Time: 5956-3875 PT Time Calculation (min): 18 min   Charges:   PT Evaluation $Initial PT Evaluation Tier I: 1 Procedure PT Treatments $Gait Training: 8-22 mins   PT G CodesAngelina Ok Ronelle Smallman 01/30/2014, 12:16 PM  [employer] PT [phone]

## 2014-01-30 NOTE — Progress Notes (Signed)
      [address]       [place] [address]             [phone]        CARDIOTHORACIC SURGERY PROGRESS NOTE   R7 Days Post-Op Procedure(s) (LRB): THORACIC ASCENDING ANEURYSM REPAIR (AAA) (N/A)  Subjective: Feels better.  Appetite improved some.  Slowly getting stronger.  Denies pain, SOB  Objective: Vital signs: BP Readings from Last 1 Encounters:  01/30/14 138/91   Pulse Readings from Last 1 Encounters:  01/30/14 103   Resp Readings from Last 1 Encounters:  01/30/14 5   Temp Readings from Last 1 Encounters:  01/30/14 98.6 F (37 C) Oral    Hemodynamics:    Physical Exam:  Rhythm:   sinus  Breath sounds: clear  Heart sounds:  RRR  Incisions:  Clean and dry  Abdomen:  Soft, non-distended, non-tender  Extremities:  Warm, well-perfused    Intake/Output from previous day: 05/31 0701 - 06/01 0700 In: 1656.7 [P.O.:250; I.V.:1106.7; IV Piggyback:300] Out: 975 [Urine:975] Intake/Output this shift:    Lab Results:  CBC: Recent Labs  01/29/14 0433 01/30/14 0330  WBC 28.3* 17.5*  HGB 13.2 11.4*  HCT 38.9* 34.1*  PLT 341 329    BMET:  Recent Labs  01/29/14 0433 01/30/14 0330  NA 135* 137  K 3.6* 3.6*  CL 93* 98  CO2 32 26  GLUCOSE 88 75  BUN 34* 26*  CREATININE 0.82 0.65  CALCIUM 8.8 8.0*     CBG (last 3)  No results found for this basename: GLUCAP,  in the last 72 hours  ABG    Component Value Date/Time   PHART 7.551* 01/27/2014 0509   PCO2ART 37.0 01/27/2014 0509   PO2ART 73.0* 01/27/2014 0509   HCO3 32.6* 01/27/2014 0509   TCO2 34 01/27/2014 0509   ACIDBASEDEF 1.0 01/24/2014 1925   O2SAT 96.0 01/27/2014 0509    CXR: Clear, mild basilar atelectasis  Assessment/Plan: S/P Procedure(s) (LRB): THORACIC ASCENDING ANEURYSM REPAIR (AAA) (N/A)  Overall improving Leukocytosis improved Oral intake improved HTN well controlled INR still elevated - no coumadin Stop IV fluids and transfer 2W   Danny Frederick 01/30/2014 7:44 AM

## 2014-01-30 NOTE — Progress Notes (Signed)
[facility]  Patient Status: New pt for [facility] this admission   [facility] is providing the following services:  [place] will provide [facility] and home infusion pharmacy services for possible home IV ABX. [facility] hospital coordinators follow pt while inpatient and support DC home when deemed appropriate by MD.   If patient discharges after hours, please call [phone].   Larry Sierras 01/30/2014, 5:00 PM

## 2014-01-30 NOTE — Progress Notes (Signed)
INFECTIOUS DISEASE PROGRESS NOTE  ID: Danny Frederick is a 80 y.o. male with  Principal Problem:   Ruptured thoracic aortic aneurysm Active Problems:   Pulmonary artery thrombosis   Right ventricular dysfunction  Subjective: Afebrile, fatigue   Abtx: day #8/ currently on ctx and rif Anti-infectives   Start     Dose/Rate Route Frequency Ordered Stop   01/25/14 1500  rifampin (RIFADIN) capsule 300 mg     300 mg Oral Daily 01/25/14 1354     01/25/14 1500  cefTRIAXone (ROCEPHIN) 2 g in dextrose 5 % 50 mL IVPB     2 g 100 mL/hr over 30 Minutes Intravenous Every 24 hours 01/25/14 1354     01/24/14 1700  levofloxacin (LEVAQUIN) IVPB 750 mg  Status:  Discontinued     750 mg 100 mL/hr over 90 Minutes Intravenous Every 24 hours 01/24/14 1033 01/25/14 1354   01/24/14 1100  vancomycin (VANCOCIN) IVPB 1000 mg/200 mL premix  Status:  Discontinued     1,000 mg 200 mL/hr over 60 Minutes Intravenous  Once 01/24/14 0306 01/24/14 0934   01/24/14 1045  vancomycin (VANCOCIN) IVPB 1000 mg/200 mL premix  Status:  Discontinued     1,000 mg 200 mL/hr over 60 Minutes Intravenous Every 12 hours 01/24/14 1033 01/25/14 1354   01/24/14 0700  cefUROXime (ZINACEF) 1.5 g in dextrose 5 % 50 mL IVPB  Status:  Discontinued     1.5 g 100 mL/hr over 30 Minutes Intravenous Every 12 hours 01/24/14 0306 01/24/14 0934   01/23/14 2056  polymyxin B 500,000 Units, bacitracin 50,000 Units in sodium chloride irrigation 0.9 % 500 mL irrigation  Status:  Discontinued       As needed 01/23/14 2057 01/24/14 0306   01/23/14 1845  vancomycin (VANCOCIN) 1,250 mg in sodium chloride 0.9 % 250 mL IVPB     1,250 mg 166.7 mL/hr over 90 Minutes Intravenous To Surgery 01/23/14 1843 01/23/14 1936   01/23/14 1845  cefUROXime (ZINACEF) 1.5 g in dextrose 5 % 50 mL IVPB     1.5 g 100 mL/hr over 30 Minutes Intravenous To Surgery 01/23/14 1843 01/24/14 0035   01/23/14 1845  vancomycin (VANCOCIN) 1,000 mg in sodium chloride 0.9 % 1,000 mL  irrigation      Irrigation To Surgery 01/23/14 1836 01/23/14 2022   01/23/14 1830  cefUROXime (ZINACEF) 750 mg in dextrose 5 % 50 mL IVPB  Status:  Discontinued     750 mg 100 mL/hr over 30 Minutes Intravenous To Surgery 01/23/14 1836 01/24/14 0306   01/23/14 1645  levofloxacin (LEVAQUIN) IVPB 750 mg     750 mg 100 mL/hr over 90 Minutes Intravenous  Once 01/23/14 1633 01/23/14 1822      Medications:  Scheduled: . antiseptic oral rinse  15 mL Mouth Rinse q12n4p  . aspirin EC  81 mg Oral Daily  . cefTRIAXone (ROCEPHIN)  IV  2 g Intravenous Q24H  . feeding supplement (RESOURCE BREEZE)  1 Container Oral BID BM  . levothyroxine  50 mcg Oral QAC breakfast  . loratadine  10 mg Oral Daily  . metoprolol tartrate  25 mg Oral BID  . pantoprazole  40 mg Oral Daily  . rifampin  300 mg Oral Daily  . sodium chloride  10-40 mL Intracatheter Q12H  . sodium chloride  10-40 mL Intracatheter Q12H  . sodium chloride  3 mL Intravenous Q12H  . sodium chloride  3 mL Intravenous Q12H    Objective: Vital signs in last 24  hours: Temp:  [97.8 F (36.6 C)-98.6 F (37 C)] 98 F (36.7 C) (06/01 0751) Pulse Rate:  [80-115] 115 (06/01 0900) Resp:  [0-33] 20 (06/01 0900) BP: (107-158)/(70-96) 142/96 mmHg (06/01 0900) SpO2:  [92 %-100 %] 96 % (06/01 0900) Weight:  [132 lb 7.9 oz (60.1 kg)] 132 lb 7.9 oz (60.1 kg) (06/01 0656)   General appearance: alert, cooperative, fatigued and mild distress Resp: diminished breath sounds bilaterally and tachypneic Chest wall: wound clean superiorly Cardio: regular rate and rhythm GI: normal findings: bowel sounds normal and soft, non-tender Extremities: RUE PIC is clena.   Lab Results  Recent Labs  01/29/14 0433 01/30/14 0330  WBC 28.3* 17.5*  HGB 13.2 11.4*  HCT 38.9* 34.1*  NA 135* 137  K 3.6* 3.6*  CL 93* 98  CO2 32 26  BUN 34* 26*  CREATININE 0.82 0.65    Microbiology: Recent Results (from the past 240 hour(s))  URINE CULTURE     Status: None     Collection Time    01/23/14  4:19 PM      Result Value Ref Range Status   Specimen Description URINE, CLEAN CATCH   Final   Special Requests NONE   Final   Culture  Setup Time     Final   Value: 01/24/2014 01:43     Performed at [place]     Final   Value: NO GROWTH     Performed at [facility]   Culture     Final   Value: NO GROWTH     Performed at [facility]   Report Status 01/24/2014 FINAL   Final  CULTURE, BLOOD (ROUTINE X 2)     Status: None   Collection Time    01/23/14  4:45 PM      Result Value Ref Range Status   Specimen Description BLOOD BLOOD RIGHT FOREARM   Final   Special Requests BOTTLES DRAWN AEROBIC AND ANAEROBIC 4ML   Final   Culture  Setup Time     Final   Value: 01/23/2014 21:33     Performed at [facility]   Culture     Final   Value: STREPTOCOCCUS PNEUMONIAE     Note: SUSCEPTIBILITIES PERFORMED ON PREVIOUS CULTURE WITHIN THE LAST 5 DAYS.     Note: Gram Stain Report Called to,Read Back By and Verified With: Lillia Dallas RN 01/24/14 11:20AM BY [place]     Performed at [facility]   Report Status 01/26/2014 FINAL   Final  CULTURE, BLOOD (ROUTINE X 2)     Status: None   Collection Time    01/23/14  4:47 PM      Result Value Ref Range Status   Specimen Description BLOOD RIGHT ANTECUBITAL   Final   Special Requests BOTTLES DRAWN AEROBIC AND ANAEROBIC 4ML   Final   Culture  Setup Time     Final   Value: 01/23/2014 21:33     Performed at [facility]   Culture     Final   Value: STREPTOCOCCUS PNEUMONIAE     Note: Gram Stain Report Called to,Read Back By and Verified With: Lillia Dallas RN 01/23/14 11:20AM BY [place]     Performed at [facility]   Report Status 01/26/2014 FINAL   Final   Organism ID, Bacteria STREPTOCOCCUS PNEUMONIAE   Final  GRAM STAIN     Status: None   Collection Time    01/23/14  8:43 PM      Result Value Ref Range Status   Specimen Description FLUID    Final   Special Requests MEDIASTINAL FLUID   Final   Gram Stain     Final   Value: RARE WBC PRESENT,BOTH PMN AND MONONUCLEAR     NO ORGANISMS SEEN     CALLED TO C.YATES,RN 2036 01/23/14   Report Status 01/23/2014 FINAL   Final  BODY FLUID CULTURE     Status: None   Collection Time    01/23/14  8:43 PM      Result Value Ref Range Status   Specimen Description FLUID   Final   Special Requests MEDIASTINAL FLUID   Final   Gram Stain     Final   Value: RARE WBC PRESENT,BOTH PMN AND MONONUCLEAR     NO ORGANISMS SEEN     Gram Stain Report Called to,Read Back By and Verified With: Gram Stain Report Called to,Read Back By and Verified WithJule Economy RN 2036 01/23/14 Performed at [facility]     Performed at [facility]   Culture     Final   Value: FEW STREPTOCOCCUS PNEUMONIAE     Note: CRITICAL RESULT CALLED TO, READ BACK BY AND VERIFIED WITH: NIKKI POTTER@1319  ON [id number] BY [facility]     Performed at [facility]   Report Status 01/26/2014 FINAL   Final   Organism ID, Bacteria STREPTOCOCCUS PNEUMONIAE   Final  MRSA PCR SCREENING     Status: None   Collection Time    01/24/14  6:30 AM      Result Value Ref Range Status   MRSA by PCR NEGATIVE  NEGATIVE Final   Comment:            The GeneXpert MRSA Assay (FDA     approved for NASAL specimens     only), is one component of a     comprehensive MRSA colonization     surveillance program. It is not     intended to diagnose MRSA     infection nor to guide or     monitor treatment for     MRSA infections.  CULTURE, BLOOD (ROUTINE X 2)     Status: None   Collection Time    01/25/14  3:00 PM      Result Value Ref Range Status   Specimen Description BLOOD RIGHT ARM   Final   Special Requests BOTTLES DRAWN AEROBIC AND ANAEROBIC 10CC   Final   Culture  Setup Time     Final   Value: 01/25/2014 19:53     Performed at [facility]   Culture     Final   Value:        BLOOD CULTURE RECEIVED NO GROWTH TO DATE CULTURE  WILL BE HELD FOR 5 DAYS BEFORE ISSUING A FINAL NEGATIVE REPORT     Performed at [facility]   Report Status PENDING   Incomplete  CULTURE, BLOOD (ROUTINE X 2)     Status: None   Collection Time    01/25/14  3:15 PM      Result Value Ref Range Status   Specimen Description BLOOD LEFT ARM   Final   Special Requests BOTTLES DRAWN AEROBIC AND ANAEROBIC 10CC   Final   Culture  Setup Time     Final   Value: 01/25/2014 19:53     Performed at [facility]  Final   Value:        BLOOD CULTURE RECEIVED NO GROWTH TO DATE CULTURE WILL BE HELD FOR 5 DAYS BEFORE ISSUING A FINAL NEGATIVE REPORT     Performed at [facility]   Report Status PENDING   Incomplete  CLOSTRIDIUM DIFFICILE BY PCR     Status: None   Collection Time    01/28/14 12:37 PM      Result Value Ref Range Status   C difficile by pcr NEGATIVE  NEGATIVE Final    Studies/Results: Dg Chest Port 1 View  01/30/2014   CLINICAL DATA:  Atelectatic change  EXAM: PORTABLE CHEST - 1 VIEW  COMPARISON:  Jan 29, 2014  FINDINGS: There is consolidation in the left lower lobe. There are small pleural effusions. Elsewhere lungs are clear. Heart is mildly enlarged with normal pulmonary vascularity. Central catheter tip is at the cavoatrial junction without pneumothorax. Patient is status post coronary artery bypass grafting. There is a fracture of the posterior left first rib.  IMPRESSION: Left lower lobe consolidation. Small effusions bilaterally. No change in cardiac silhouette. No apparent pneumothorax. Fracture posterior left first rib.   Electronically Signed   By: Lowella Grip M.D.   On: 01/30/2014 07:57   Dg Chest Port 1 View  01/29/2014   CLINICAL DATA:  Status post aortic aneurysm repair  EXAM: PORTABLE CHEST - 1 VIEW  COMPARISON:  01/28/2014  FINDINGS: Bilateral mild interstitial thickening. No focal consolidation or pneumothorax. Trace bilateral pleural effusions. Stable cardiomegaly. Prior median  sternotomy. Right-sided PICC line with the tip projecting over the SVC.  Degenerative changes of the right AC joint.  IMPRESSION: Trace bilateral pleural effusions.   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 Continue with 2gm IV ceftriaxone plus rifampin (due to graft inplace on 5/26). Using 01/25/14 as day 1 (documented clearance of bacteremia). Recommend he gets weekly cmp to check for any transaminitis related to rifampin. Will arrange follow up in ID clinic in 4-6 wks  Will sign off     [place] (pager) [phone] [email or web address] 01/30/2014, 9:53 AM  LOS: 7 days

## 2014-01-30 NOTE — Progress Notes (Signed)
1348 Read PT note for today with pt only walking 3 ft. Will continue to follow pt and begin seeing when walking longer distance. Graylon Good RN BSN 01/30/2014 1:49 PM

## 2014-01-30 NOTE — Progress Notes (Signed)
CARDIAC REHAB PHASE I   PRE:  Rate/Rhythm: 108 ST    BP: sitting 120/80    SaO2: 93 RA  MODE:  Ambulation: 170 ft   POST:  Rate/Rhythm: 114 ST    BP: sitting 124/76     SaO2: 94 RA  Pt more rested this pm and wanted to try to walk. Able to get to EOB and stand with min assist. Used RW, assist x2. Leaned forward due to weakness, able to increase distance, small steps. To recliner after walk.  VSS. Will f/u. [place], ACSM 01/30/2014 3:21 PM

## 2014-01-30 NOTE — Progress Notes (Signed)
Patient transferred with belongings, medications, and chart to room [place] [facility] with nurse. Attached to monitor and new nurse at bedside. No new problems at this time.  [name]

## 2014-01-31 ENCOUNTER — Inpatient Hospital Stay (HOSPITAL_COMMUNITY): Payer: Medicare Other

## 2014-01-31 LAB — CULTURE, BLOOD (ROUTINE X 2)
CULTURE: NO GROWTH
Culture: NO GROWTH

## 2014-01-31 LAB — BASIC METABOLIC PANEL
BUN: 22 mg/dL (ref 6–23)
CO2: 26 meq/L (ref 19–32)
CREATININE: 0.74 mg/dL (ref 0.50–1.35)
Calcium: 8.4 mg/dL (ref 8.4–10.5)
Chloride: 98 mEq/L (ref 96–112)
GFR calc non Af Amer: 86 mL/min — ABNORMAL LOW (ref >90)
Glucose, Bld: 93 mg/dL (ref 70–99)
Potassium: 3.9 mEq/L (ref 3.7–5.3)
Sodium: 134 mEq/L — ABNORMAL LOW (ref 137–147)

## 2014-01-31 LAB — CBC
HCT: 33.6 % — ABNORMAL LOW (ref 39.0–52.0)
Hemoglobin: 11.3 g/dL — ABNORMAL LOW (ref 13.0–17.0)
MCH: 28.3 pg (ref 26.0–34.0)
MCHC: 33.6 g/dL (ref 30.0–36.0)
MCV: 84.2 fL (ref 78.0–100.0)
Platelets: 395 10*3/uL (ref 150–400)
RBC: 3.99 MIL/uL — ABNORMAL LOW (ref 4.22–5.81)
RDW: 16 % — ABNORMAL HIGH (ref 11.5–15.5)
WBC: 14.9 10*3/uL — ABNORMAL HIGH (ref 4.0–10.5)

## 2014-01-31 LAB — PROTIME-INR
INR: 1.86 — AB (ref 0.00–1.49)
PROTHROMBIN TIME: 20.9 s — AB (ref 11.6–15.2)

## 2014-01-31 MED ORDER — WARFARIN - PHYSICIAN DOSING INPATIENT
Freq: Every day | Status: DC
Start: 2014-01-31 — End: 2014-02-04
  Administered 2014-02-03: 18:00:00

## 2014-01-31 MED ORDER — COUMADIN BOOK
Freq: Once | Status: AC
  Administered 2014-01-31: 16:00:00
  Filled 2014-01-31: qty 1

## 2014-01-31 MED ORDER — WARFARIN SODIUM 1 MG PO TABS
1.0000 mg | ORAL_TABLET | Freq: Every day | ORAL | Status: DC
  Administered 2014-01-31 – 2014-02-01 (×2): 1 mg via ORAL
  Filled 2014-01-31 (×3): qty 1

## 2014-01-31 MED ORDER — WARFARIN VIDEO
Freq: Once | Status: AC
  Administered 2014-01-31: 16:00:00

## 2014-01-31 NOTE — Progress Notes (Signed)
DC EPW per Md orders and protocol; wires intact; pt on bedrest for one hour; Q15 vitals for one hour; call bell within reach; wife at bedside.  Carollee Sires, RN

## 2014-01-31 NOTE — Discharge Instructions (Addendum)
HOME CARE INSTRUCTIONS  Only take over-the-counter or prescription medicines as directed by your health care provider. Take all medicines exactly as directed. Do not stop taking medicines or start any new medicines without first checking with your health care provider.  Take your pulse as directed by your health care provider.  Perform deep breathing as directed by your health care provider. If you were given a device called an incentive spirometer, use it to practice deep breathing several times a day. Support your chest with a pillow or your arms when you take deep breaths or cough.  Keep incision areas clean, dry, and protected. Remove or change any bandages (dressings) only as directed by your health care provider. You may have skin adhesive strips over the incision areas. Do not take the strips off. They will fall off on their own.  Check incision areas daily for any swelling, redness, or drainage.  If incisions were made in your legs, do the following:  Avoid crossing your legs.   Avoid sitting for long periods of time. Change positions every 30 minutes.  Elevate your legs when you are sitting.  Take showers once your health care provider approves. Pat incisions dry. Do not rub incisions with a washcloth or towel. Do not take tub baths or go swimming until your health care provider approves.  Eat foods that are high in fiber, such as raw fruits and vegetables, whole grains, beans, and nuts. Meats should be lean cut. Avoid canned, processed, and fried foods.  Drink enough fluids to keep your urine clear or pale yellow.  Weigh yourself every day. This helps identify if you are retaining fluid that may make your heart and lungs work harder.  Rest and limit activity as directed by your health care provider. You may be instructed to:  Stop any activity at once if you have chest pain, shortness of breath, irregular heartbeats, or dizziness. Get help right away if you have any of these symptoms.  Move  around frequently for short periods or take short walks as directed by your health care provider. Increase your activities gradually. You may need physical therapy or cardiac rehabilitation to help strengthen your muscles and build your endurance.  Avoid lifting, pushing, or pulling anything heavier than 10 lb (4.5 kg) for at least 6 weeks after surgery.  Do not drive until your health care provider approves.  Ask your health care provider when you may return to work and resume sexual activity.  Follow up with your health care provider as directed.  SEEK MEDICAL CARE IF:  You have swelling, redness, increasing pain, or drainage at the site of an incision.  You develop a fever.  You have swelling in your ankles or legs.  You have pain in your legs.  You have weight gain of 2 or more pounds a day.  You are nauseous or vomit.  You have diarrhea.  SEEK IMMEDIATE MEDICAL CARE IF:  You have chest pain that goes to your jaw or arms.  You have shortness of breath.  You have a fast or irregular heartbeat.  You notice a "clicking" in your breastbone (sternum) when you move.  You have numbness or weakness in your arms or legs.  You feel dizzy or lightheaded.  MAKE SURE YOU:  Understand these instructions.  Will watch your condition.  Will get help right away if you are not doing well or get worse. Document Released: 03/07/2005 Document Revised: 04/20/2013 Document Reviewed: 01/25/2013  Information on my medicine - Coumadin   (Warfarin)  This medication education was reviewed with me or my healthcare representative as part of my discharge preparation.  The pharmacist that spoke with me during my hospital stay was:  [place], RPH  Why was Coumadin prescribed for you? Coumadin was prescribed for you because you have a blood clot or a medical condition that can cause an increased risk of forming blood clots. Blood clots can cause serious health problems by blocking the flow of blood to the  heart, lung, or brain. Coumadin can prevent harmful blood clots from forming. As a reminder your indication for Coumadin is:   Select from menupulmonary artery thrombus  What test will check on my response to Coumadin? While on Coumadin (warfarin) you will need to have an INR test regularly to ensure that your dose is keeping you in the desired range. The INR (international normalized ratio) number is calculated from the result of the laboratory test called prothrombin time (PT).  If an INR APPOINTMENT HAS NOT ALREADY BEEN MADE FOR YOU please schedule an appointment to have this lab work done by your health care provider within 7 days. Your INR goal is usually a number between:  2 to 3 or your provider may give you a more narrow range like 2-2.5.  Ask your health care provider during an office visit what your goal INR is.  What  do you need to  know  About  COUMADIN? Take Coumadin (warfarin) exactly as prescribed by your healthcare provider about the same time each day.  DO NOT stop taking without talking to the doctor who prescribed the medication.  Stopping without other blood clot prevention medication to take the place of Coumadin may increase your risk of developing a new clot or stroke.  Get refills before you run out.  What do you do if you miss a dose? If you miss a dose, take it as soon as you remember on the same day then continue your regularly scheduled regimen the next day.  Do not take two doses of Coumadin at the same time.  Important Safety Information A possible side effect of Coumadin (Warfarin) is an increased risk of bleeding. You should call your healthcare provider right away if you experience any of the following:   Bleeding from an injury or your nose that does not stop.   Unusual colored urine (red or dark brown) or unusual colored stools (red or black).   Unusual bruising for unknown reasons.   A serious fall or if you hit your head (even if there is no  bleeding).  Some foods or medicines interact with Coumadin (warfarin) and might alter your response to warfarin. To help avoid this:   Eat a balanced diet, maintaining a consistent amount of Vitamin K.   Notify your provider about major diet changes you plan to make.   Avoid alcohol or limit your intake to 1 drink for women and 2 drinks for men per day. (1 drink is 5 oz. wine, 12 oz. beer, or 1.5 oz. liquor.)  Make sure that ANY health care provider who prescribes medication for you knows that you are taking Coumadin (warfarin).  Also make sure the healthcare provider who is monitoring your Coumadin knows when you have started a new medication including herbals and non-prescription products.  Coumadin (Warfarin)  Major Drug Interactions  Increased Warfarin Effect Decreased Warfarin Effect  Alcohol (large quantities) Antibiotics (esp. Septra/Bactrim, Flagyl, Cipro) Amiodarone (Cordarone) Aspirin (  ASA) Cimetidine (Tagamet) Megestrol (Megace) NSAIDs (ibuprofen, naproxen, etc.) Piroxicam (Feldene) Propafenone (Rythmol SR) Propranolol (Inderal) Isoniazid (INH) Posaconazole (Noxafil) Barbiturates (Phenobarbital) Carbamazepine (Tegretol) Chlordiazepoxide (Librium) Cholestyramine (Questran) Griseofulvin Oral Contraceptives Rifampin Sucralfate (Carafate) Vitamin K   Coumadin (Warfarin) Major Herbal Interactions  Increased Warfarin Effect Decreased Warfarin Effect  Garlic Ginseng Ginkgo biloba Coenzyme Q10 Green tea St. Johns wort    Coumadin (Warfarin) FOOD Interactions  Eat a consistent number of servings per week of foods HIGH in Vitamin K (1 serving =  cup)  Collards (cooked, or boiled & drained) Kale (cooked, or boiled & drained) Mustard greens (cooked, or boiled & drained) Parsley *serving size only =  cup Spinach (cooked, or boiled & drained) Swiss chard (cooked, or boiled & drained) Turnip greens (cooked, or boiled & drained)  Eat a consistent number of  servings per week of foods MEDIUM-HIGH in Vitamin K (1 serving = 1 cup)  Asparagus (cooked, or boiled & drained) Broccoli (cooked, boiled & drained, or raw & chopped) Brussel sprouts (cooked, or boiled & drained) *serving size only =  cup Lettuce, raw (green leaf, endive, romaine) Spinach, raw Turnip greens, raw & chopped   These websites have more information on Coumadin (warfarin):  FailFactory.se; VeganReport.com.au;

## 2014-01-31 NOTE — Discharge Summary (Signed)
[address]       [place] [address]             [phone]              Discharge Summary  Name: Danny Frederick DOB: 1933/12/07 80 y.o. MRN: [id number]   Admission Date: 01/23/2014 Discharge Date: 02/04/2014    Admitting Diagnosis: Ruptured ascending thoracic aortic aneurysm   Discharge Diagnosis:  Ruptured ascending thoracic aortic aneurysm Streptococcal bacteremia due to endocarditis Expected postoperative blood loss anemia Depression    Past Medical History  Diagnosis Date  . Hypertension   . Unspecified hypothyroidism   . Mitral valve prolapse     h/o; normal echo 12/2011 with no MVP seen  . Foot drop, right   . BPH (benign prostatic hypertrophy)   . Iron deficiency anemia, unspecified 6/09  . Hearing loss in left ear   . Diverticulosis of colon 12/09  . Internal hemorrhoids   . GERD (gastroesophageal reflux disease)   . Allergic rhinitis, cause unspecified     on allergy shots (Dr. Orvil Feil)  . Allergic conjunctivitis   . Ruptured thoracic aortic aneurysm 01/23/2014  . 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 Right ventricular dysfunction 01/23/2014    Secondary to obstruction of main pulmonary artery      Procedures: Repair of Ruptured Ascending Thoracic Aortic Aneurysm -01/24/2014  32 mm Hemashield Straight Graft Repair   Right Axillary Artery Cannulation   Deep Hypothermia with Low Flow Antegrade Partial Circulatory      HPI:  The patient is a 80 y.o. male with history of hypertension, GE reflux disease, benign prostatic hypertrophy, and degenerative disc disease of the cervical and lumbar spine who has otherwise been very healthy for most of his life and physically very active. Beginning in January of this year, the patient began to experience progressive generalized weakness and fatigue. He reported occasional substernal chest pain, but this was relatively mild in severity. He was treated for depression. 6 weeks ago  while traveling in [place], he was evaluated in the emergency room for severe weakness. No specific diagnosis was discovered. Over the past 2 weeks, the patient has developed progressive exertional shortness of breath. He now gets short of breath with minimal exertion. He denies resting shortness of breath, ongoing chest pain, fevers or chills, dizzy spells or syncope, hoarseness, productive cough, hemoptysis, or wheezing. He has had poor appetite and he thinks he has lost around 10 pounds. Symptoms have become acutely worse over the last few days, prompting his family to urgently go to the emergency room. He was initially evaluated in the emergency department at [facility]. The patient was tachycardic and mildly hypotensive. He had low-grade fever. Blood work was notable for leukocytosis and mild lactic acidosis. Chest x-ray demonstrated widening mediastinum with prominence of the left hilum. A CT angiogram of the chest was performed to rule out pulmonary embolus. This revealed a large aneurysm of the distal ascending thoracic aorta that appears to have contained rupture. There is blood in the anterior mediastinum. The aneurysm compresses the pulmonary artery. There is a small pulmonary embolus. The patient was sent directly to [facility] emergency department for cardiothoracic surgical consultation. Dr. Roxy Manns saw the patient and reviewed his films, and elected to take him emergently to the operating room for repair.  All risks, benefits and alternatives of surgery were explained in detail, and the patient agreed to proceed.    Hospital Course:  The patient  was admitted to [facility] on 01/23/2014. The patient was taken to the operating room and underwent the above procedure.  He tolerated the procedure well and was transferred to the SICU for postoperative management.  The patient was slowly weaned from the ventilator and extubated late postop day 1. Blood cultures were positive for strep, and Infectious  Disease was consulted for assistance with management of endocarditis and bacteremia. He was started on IV Ceftriaxone and Rifampin.  While in the ICU, the patient developed confusion and near syncope, which was felt to be related to orthostasis.  A head CT was performed which was negative for any acute changes.  He also had leukocytosis with white blood count peaking at 40,000, as well as nausea and diarrhea. The patient remained afebrile.  A C.difficile toxin was negative.  He remained weak and deconditioned, and physical therapy was consulted.    The patient has made slow but steady progress postoperatively.  His vital signs have remained stable and he is maintaining sinus rhythm.  He is progressing with mobility and ambulation. He was started on low dose diuretics for postoperative volume overload and is diuresing well.  Incisions are healing well. Infectious Disease has recommended a full 6 week course of IV antibiotics, as well as imaging of his spine and a dental evaluation in the future. White blood cell count has normalized and he has remained afebrile. He has been started on Coumadin since clot was noted in the main pulmonary artery at the time of presentation, although it was felt the clot was likely not embolic but developed in situ as a result of the false aneurysm w/ compression of PA.  His INR is trending up appropriately, although he has required only small doses of Coumadin. GI symptoms have resolved and he is tolerating a regular diet.  He developed symptoms of depression with suicidal ideation and was seen by Psychiatry.  He was felt to not be a threat to himself or others, and his antidepressant medications were adjusted.  It was felt that he had no active suicidal ideation, and could be followed as an outpatient. He was evaluated on today's date and is presently stable for discharge home.    Recent vital signs:  Filed Vitals:   01/31/14 0935  BP:   Pulse: 114  Temp:   Resp:      Recent laboratory studies:  CBC: Recent Labs  01/30/14 0330 01/31/14 0555  WBC 17.5* 14.9*  HGB 11.4* 11.3*  HCT 34.1* 33.6*  PLT 329 395   BMET:  Recent Labs  01/30/14 0330 01/31/14 0555  NA 137 134*  K 3.6* 3.9  CL 98 98  CO2 26 26  GLUCOSE 75 93  BUN 26* 22  CREATININE 0.65 0.74  CALCIUM 8.0* 8.4    PT/INR:  Recent Labs  01/31/14 0555  LABPROT 20.9*  INR 1.86*     Discharge Medications:     Medication List    STOP taking these medications       amLODipine 2.5 MG tablet  Commonly known as:  NORVASC      TAKE these medications       aspirin 81 MG tablet  Take 81 mg by mouth at bedtime.     cefTRIAXone 2 g in dextrose 5 % 50 mL  Inject 2 g into the vein daily. X 6 weeks (first dose 5/27)     citalopram 20 MG tablet  Commonly known as:  CELEXA  Take 1 tablet (20 mg  total) by mouth daily.     furosemide 20 MG tablet  Commonly known as:  LASIX  Take 1 tablet (20 mg total) by mouth daily.     Iron 325 (65 FE) MG Tabs  Take 1 tablet by mouth every other day.     levothyroxine 50 MCG tablet  Commonly known as:  SYNTHROID, LEVOTHROID  Take 50 mcg by mouth daily before breakfast.     lisinopril 10 MG tablet  Commonly known as:  PRINIVIL,ZESTRIL  Take 1 tablet (10 mg total) by mouth daily.     metoprolol tartrate 25 MG tablet  Commonly known as:  LOPRESSOR  Take 1.5 tablets (37.5 mg total) by mouth 2 (two) times daily.     mirtazapine 7.5 MG tablet  Commonly known as:  REMERON  Take 1 tablet (7.5 mg total) by mouth at bedtime.     omeprazole 20 MG capsule  Commonly known as:  PRILOSEC  Take 20 mg by mouth every other day.     potassium chloride 10 MEQ tablet  Commonly known as:  K-DUR,KLOR-CON  Take 1 tablet (10 mEq total) by mouth daily.     rifampin 300 MG capsule  Commonly known as:  RIFADIN  Take 1 capsule (300 mg total) by mouth daily. X 6 weeks     traMADol 50 MG tablet  Commonly known as:  ULTRAM  Take 1 tablet (50 mg  total) by mouth every 4 (four) hours as needed (for pain).     warfarin 1 MG tablet  Commonly known as:  COUMADIN  Take 1 mg daily or as directed by [facility]madin Clinic starting on Sunday, 02/05/2014         Discharge Instructions:  The patient is to refrain from driving, heavy lifting or strenuous activity.  May shower daily and clean incisions with soap and water.  May resume regular diet.   Follow Up:    Follow-up Information   Follow up with Rexene Alberts, MD On 02/13/2014. (Have a chest x-ray at [facility] at 2:00, then see MD at 3:00)    Specialty:  Cardiothoracic Surgery   Contact information:   [address] [place] [address] [phone]       Follow up with [facility] On 03/01/2014. (Appointment is at 2:00 with cardiology)    Contact information:   [place] [phone]       Follow up with [facility]             . (Office will arrange outpatient follow up in 6 weeks.)    Contact information:   [address] [place] [phone]       Follow up with Durward Parcel., MD. (Please call and schedule outpatient followup for depression )    Specialty:  Psychiatry   Contact information:   [place] [address] [place] will draw PT/INR on 6/9 and call results to [facility] and assist with IV antibiotics     Coolidge Breeze 01/31/2014, 9:48 AM

## 2014-01-31 NOTE — Progress Notes (Signed)
Physical Therapy Treatment Patient Details Name: Danny Frederick MRN: [id number] DOB: 12-09-1933 Today's Date: 01/31/2014    History of Present Illness Pt adm with ruptured thoracic aortic aneurysm and underwent repair. PMH-lumbar fusion ~9 months ago, HTN    PT Comments    Pt with excellent progression with mobility today. Pt unable to recall sternal precautions and educated for all. Pt with a rating of 12 RPE scale with gait. Pt encouraged to continue mobility and HEP. Will continue to follow. Pt does plan to DC to dgtr's house where he can stay downstairs, has one step and all necessary DME  Follow Up Recommendations  Supervision - Intermittent;Home health PT     Equipment Recommendations  None recommended by PT    Recommendations for Other Services       Precautions / Restrictions Precautions Precautions: Sternal;Fall Restrictions Weight Bearing Restrictions:  (sternal precautions)    Mobility  Bed Mobility   Bed Mobility: Sidelying to Sit   Sidelying to sit: Supervision       General bed mobility comments: Verbal cues for technique and precautions  Transfers Overall transfer level: Needs assistance     Sit to Stand: Supervision         General transfer comment: Verbal cues for technique of placing hands on knees to remember sternal precautions.   Ambulation/Gait Ambulation/Gait assistance: Min guard Ambulation Distance (Feet): 350 Feet Assistive device: Rolling walker (2 wheeled) Gait Pattern/deviations: Step-through pattern;Decreased stride length Gait velocity: 30/23= 1.3 ft/sec Gait velocity interpretation: <1.8 ft/sec, indicative of risk for recurrent falls     Stairs Stairs: Yes Stairs assistance: Supervision Stair Management: Backwards;With walker Number of Stairs: 1    Wheelchair Mobility    Modified Rankin (Stroke Patients Only)       Balance                                    Cognition Arousal/Alertness:  Awake/alert Behavior During Therapy: WFL for tasks assessed/performed Overall Cognitive Status: Within Functional Limits for tasks assessed       Memory: Decreased recall of precautions              Exercises General Exercises - Lower Extremity Long Arc Quad: AROM;Seated;Both;20 reps Hip ABduction/ADduction: AROM;Seated;Both;20 reps Hip Flexion/Marching: AROM;Seated;Both;20 reps Toe Raises: AROM;Seated;Both;20 reps Heel Raises: AROM;Seated;Both;20 reps    General Comments        Pertinent Vitals/Pain No pain HR 114-124 with activity sats 95-98% on RA    Home Living                      Prior Function            PT Goals (current goals can now be found in the care plan section) Progress towards PT goals: Goals met and updated - see care plan    Frequency       PT Plan Current plan remains appropriate    Co-evaluation             End of Session   Activity Tolerance: Patient tolerated treatment well Patient left: in chair;with call bell/phone within reach;with family/visitor present     Time: 2376-2831 PT Time Calculation (min): 26 min  Charges:  $Gait Training: 8-22 mins $Therapeutic Exercise: 8-22 mins                    G Codes:  Fairy Ashlock B Nirel Babler 01/31/2014, 9:38 AM Delaney Meigs, PT [phone]

## 2014-01-31 NOTE — Progress Notes (Signed)
CARDIAC REHAB PHASE I   PRE:  Rate/Rhythm: 98 SR    BP: sitting 130/90    SaO2: 94 rA  MODE:  Ambulation: 310 ft   POST:  Rate/Rhythm: 125 ST    BP: sitting 110/80     SaO2: 94 RA  Pt able to ambulate with RW, assist x1 (min assist). Sts he feels more fatigued this second walk compared to his first walk this morning. Reminders to stand tall and take slightly bigger steps. Return to bed to have EP wires out. HR to 125 ST, BP lower after walk, no c/o. [place], ACSM 01/31/2014 11:31 AM

## 2014-01-31 NOTE — Progress Notes (Addendum)
      [address]       [place] [address]             [phone]      8 Days Post-Op Procedure(s) (LRB): THORACIC ASCENDING ANEURYSM REPAIR (AAA) (N/A)  Subjective:  [name] is feeling better this morning.  He continues to be surprised how week he is.  I explained to the patient that he had a big surgery and he would notice improvement over the next several weeks.  He is ambulating with minimal assistance.  Objective: Vital signs in last 24 hours: Temp:  [98 F (36.7 C)-98.3 F (36.8 C)] 98.2 F (36.8 C) (06/02 0525) Pulse Rate:  [88-115] 90 (06/02 0525) Cardiac Rhythm:  [-] Sinus tachycardia (06/02 0840) Resp:  [18-20] 18 (06/02 0525) BP: (122-150)/(80-101) 126/83 mmHg (06/02 0525) SpO2:  [95 %-97 %] 96 % (06/02 0525) Weight:  [132 lb 0.9 oz (59.9 kg)] 132 lb 0.9 oz (59.9 kg) (06/02 0525)  Intake/Output from previous day: 06/01 0701 - 06/02 0700 In: 440 [P.O.:440] Out: 751 [Urine:750; Stool:1]  General appearance: alert, cooperative and no distress Heart: regular rate and rhythm Lungs: clear to auscultation bilaterally Abdomen: soft, non-tender; bowel sounds normal; no masses,  no organomegaly Extremities: edema trace Wound: clean and dry  Lab Results:  Recent Labs  01/30/14 0330 01/31/14 0555  WBC 17.5* 14.9*  HGB 11.4* 11.3*  HCT 34.1* 33.6*  PLT 329 395   BMET:  Recent Labs  01/30/14 0330 01/31/14 0555  NA 137 134*  K 3.6* 3.9  CL 98 98  CO2 26 26  GLUCOSE 75 93  BUN 26* 22  CREATININE 0.65 0.74  CALCIUM 8.0* 8.4    PT/INR:  Recent Labs  01/31/14 0555  LABPROT 20.9*  INR 1.86*   ABG    Component Value Date/Time   PHART 7.551* 01/27/2014 0509   HCO3 32.6* 01/27/2014 0509   TCO2 34 01/27/2014 0509   ACIDBASEDEF 1.0 01/24/2014 1925   O2SAT 96.0 01/27/2014 0509   CBG (last 3)  No results found for this basename: GLUCAP,  in the last 72 hours  Assessment/Plan: S/P Procedure(s) (LRB): THORACIC ASCENDING ANEURYSM  REPAIR (AAA) (N/A)  1. CV- NSR good rate and pressure control- continue Lopressor 2. Pulm- off oxygen, encouraged use of IS 3. Renal- creatinine WNL, weight is stable no diuretics at this time 4. INR down to 1.86, will d/c EPW, restart Coumadin at 1 mg daily 5. ID- Streptococcal pneumoniae bacteremia/aortitis- Continue Rocephin, Rifampin Day 7/42 6. Dispo- patient doing well, slowly progressing, H/H is arranged, possibly d/c home this week    LOS: 8 days    Danny Frederick 01/31/2014  I have seen and examined the patient and agree with the assessment and plan as outlined.  Rexene Alberts 01/31/2014 2:24 PM

## 2014-02-01 LAB — PROTIME-INR
INR: 1.96 — ABNORMAL HIGH (ref 0.00–1.49)
Prothrombin Time: 21.7 seconds — ABNORMAL HIGH (ref 11.6–15.2)

## 2014-02-01 MED ORDER — FUROSEMIDE 20 MG PO TABS
20.0000 mg | ORAL_TABLET | Freq: Every day | ORAL | Status: DC
  Administered 2014-02-02 – 2014-02-04 (×3): 20 mg via ORAL
  Filled 2014-02-01 (×3): qty 1

## 2014-02-01 MED ORDER — LISINOPRIL 10 MG PO TABS
10.0000 mg | ORAL_TABLET | Freq: Every day | ORAL | Status: DC
  Administered 2014-02-01 – 2014-02-04 (×4): 10 mg via ORAL
  Filled 2014-02-01 (×4): qty 1

## 2014-02-01 MED ORDER — POTASSIUM CHLORIDE CRYS ER 10 MEQ PO TBCR
10.0000 meq | EXTENDED_RELEASE_TABLET | Freq: Every day | ORAL | Status: DC
  Administered 2014-02-02 – 2014-02-04 (×3): 10 meq via ORAL
  Filled 2014-02-01 (×3): qty 1

## 2014-02-01 MED ORDER — BOOST / RESOURCE BREEZE PO LIQD
1.0000 | Freq: Three times a day (TID) | ORAL | Status: DC
  Administered 2014-02-01 – 2014-02-03 (×4): 1 via ORAL

## 2014-02-01 NOTE — Progress Notes (Signed)
NUTRITION FOLLOW UP  INTERVENTION: Resource Breeze po TID, each supplement provides 250 kcal and 9 grams of protein RD to follow for nutrition care plan  NUTRITION DIAGNOSIS: Inadequate oral intake now related to limited appetite as evidenced by PO intake 40%, ongoing  Goal: Pt to meet >/= 90% of their estimated nutrition needs, progressing  Monitor:  PO & supplemental intake, weight, labs, I/O's  ASSESSMENT: 80 year old male with history of HTN, GE reflux disease, benign prostatic hypertrophy, and degenerative disc disease of the cervical and lumbar spine; chest x-ray demonstrated widening mediastinum with prominence of the left hilum.  A CT angiogram of the chest was performed to rule out pulmonary embolus. This reveals a large aneurysm of the distal ascending thoracic aorta that appears to have contained rupture. Sent directly to [facility] ED for cardiothoracic surgical consultation.  Patient s/p procedure 5/25: REPAIR OF RUPTURED ASCENDING THORACIC AORTIC ANEURYSM  Patient extubated 5/26.  Transferred to 2W-Cardiac 6/1.  Patient reports his appetite is doing pretty good.  PO intake variable at 10-50% per flowsheet records.  Had opened [employer] on tray table.  States he likes and is drinking.  Would benefit from 3 per day.  RD to adjust frequency order.  Possible D/C in AM.  Height: Ht Readings from Last 1 Encounters:  01/25/14 5\' 3"  (1.6 m)    Weight: Wt Readings from Last 1 Encounters:  02/01/14 134 lb 8.8 oz (61.03 kg)    BMI:  Body mass index is 23.84 kg/(m^2).  Estimated Nutritional Needs: Kcal: 1700-1850 Protein: 115-125 gm Fluid: per MD  Skin: chest surgical incision   Diet Order: Cardiac   Intake/Output Summary (Last 24 hours) at 02/01/14 1158 Last data filed at 02/01/14 0730  Gross per 24 hour  Intake    840 ml  Output   1051 ml  Net   -211 ml    Labs:   Recent Labs Lab 01/25/14 1615  01/29/14 0433 01/30/14 0330  01/31/14 0555  NA  --   < > 135* 137 134*  K  --   < > 3.6* 3.6* 3.9  CL  --   < > 93* 98 98  CO2  --   < > 32 26 26  BUN  --   < > 34* 26* 22  CREATININE 1.09  < > 0.82 0.65 0.74  CALCIUM  --   < > 8.8 8.0* 8.4  MG 2.8*  --   --   --   --   GLUCOSE  --   < > 88 75 93  < > = values in this interval not displayed.   Scheduled Meds: . antiseptic oral rinse  15 mL Mouth Rinse q12n4p  . aspirin EC  81 mg Oral Daily  . cefTRIAXone (ROCEPHIN)  IV  2 g Intravenous Q24H  . citalopram  10 mg Oral Daily  . feeding supplement (RESOURCE BREEZE)  1 Container Oral BID BM  . levothyroxine  50 mcg Oral QAC breakfast  . lisinopril  10 mg Oral Daily  . loratadine  10 mg Oral Daily  . metoprolol tartrate  25 mg Oral BID  . pantoprazole  40 mg Oral Daily  . rifampin  300 mg Oral Daily  . sodium chloride  10-40 mL Intracatheter Q12H  . sodium chloride  3 mL Intravenous Q12H  . sodium chloride  3 mL Intravenous Q12H  . warfarin  1 mg Oral q1800  . Warfarin - Physician Dosing Inpatient   Does  not apply q1800    Continuous Infusions:    Past Medical History  Diagnosis Date  . Hypertension   . Unspecified hypothyroidism   . Mitral valve prolapse     h/o; normal echo 12/2011 with no MVP seen  . Foot drop, right   . BPH (benign prostatic hypertrophy)   . Iron deficiency anemia, unspecified 6/09  . Hearing loss in left ear   . Diverticulosis of colon 12/09  . Internal hemorrhoids   . GERD (gastroesophageal reflux disease)   . Allergic rhinitis, cause unspecified     on allergy shots (Dr. Orvil Feil)  . Allergic conjunctivitis   . Ruptured thoracic aortic aneurysm 01/23/2014  . 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 Right ventricular dysfunction 01/23/2014    Secondary to obstruction of main pulmonary artery    Past Surgical History  Procedure Laterality Date  . Prostate surgery  2007    photovaporization  . Cervical laminectomy  1972    C5-6  . Spine surgery  2006    L4-5 disk surgery   . Rotator cuff repair  10/2008    left; Dr. Onnie Graham  . Esophagogastroduodenoscopy  10/31/08    normal; Dr. Edison Nasuti  . Tonsillectomy    . Hemorroidal banding  04/07/2013    x3-Dr.Eric Redmond Pulling  . Hemorrhoidectomy with hemorrhoid banding  04/07/13  . Spine surgery  04/2013    L4-5, L5-S1 fusion.  Dr. Christella Noa  . Thoracic aortic aneurysm repair N/A 01/23/2014    Procedure: THORACIC ASCENDING ANEURYSM REPAIR (AAA);  Surgeon: Rexene Alberts, MD;  Location: [facility];  Service: Open Heart Surgery;  Laterality: N/A;    Arthur Holms, RD, LDN Pager #: [phone] After-Hours Pager #: [phone]

## 2014-02-01 NOTE — Progress Notes (Signed)
CARDIAC REHAB PHASE I   PRE:  Rate/Rhythm: 96 SR     BP: sitting 100/70    SaO2: 93 RA  MODE:  Ambulation: to EOB   Planned to walk with pt however HH RN came for appt to teach on IV antibiotics. Pt and family had questions on getting OOB and sternal precautions. Had pt practice this, which he did well. Pt then practiced going to bed. Moving well. Can walk with family or staff today. Will f/u in am about 10:00 for education with family. [place], ACSM 02/01/2014 12:01 PM

## 2014-02-01 NOTE — Progress Notes (Addendum)
      [address]       [place] [address]             [phone]      9 Days Post-Op Procedure(s) (LRB): THORACIC ASCENDING ANEURYSM REPAIR (AAA) (N/A)  Subjective:  Danny Frederick has no complaints this morning.  He continues to progress and is anxious to get home.  Objective: Vital signs in last 24 hours: Temp:  [98.3 F (36.8 C)-99.1 F (37.3 C)] 99.1 F (37.3 C) (06/03 0417) Pulse Rate:  [87-114] 98 (06/03 0417) Cardiac Rhythm:  [-] Sinus tachycardia (06/02 1946) Resp:  [18] 18 (06/03 0417) BP: (114-143)/(76-99) 143/99 mmHg (06/03 0417) SpO2:  [94 %-98 %] 95 % (06/03 0417) Weight:  [134 lb 8.8 oz (61.03 kg)] 134 lb 8.8 oz (61.03 kg) (06/03 0417)  Intake/Output from previous day: 06/02 0701 - 06/03 0700 In: 360 [P.O.:360] Out: 300 [Urine:300]  General appearance: alert, cooperative and no distress Heart: regular rate and rhythm Lungs: clear to auscultation bilaterally Abdomen: soft, non-tender; bowel sounds normal; no masses,  no organomegaly Extremities: edema trace Wound: clean and dry  Lab Results:  Recent Labs  01/30/14 0330 01/31/14 0555  WBC 17.5* 14.9*  HGB 11.4* 11.3*  HCT 34.1* 33.6*  PLT 329 395   BMET:  Recent Labs  01/30/14 0330 01/31/14 0555  NA 137 134*  K 3.6* 3.9  CL 98 98  CO2 26 26  GLUCOSE 75 93  BUN 26* 22  CREATININE 0.65 0.74  CALCIUM 8.0* 8.4    PT/INR:  Recent Labs  02/01/14 0635  LABPROT 21.7*  INR 1.96*   ABG    Component Value Date/Time   PHART 7.551* 01/27/2014 0509   HCO3 32.6* 01/27/2014 0509   TCO2 34 01/27/2014 0509   ACIDBASEDEF 1.0 01/24/2014 1925   O2SAT 96.0 01/27/2014 0509   CBG (last 3)  No results found for this basename: GLUCAP,  in the last 72 hours  Assessment/Plan: S/P Procedure(s) (LRB): THORACIC ASCENDING ANEURYSM REPAIR (AAA) (N/A)  1. CV- NSR continue lopressor 2. Pulm- continue IS 3. Renal- minimal hypervolemia weight is elevated 4 lbs, no diuretics 4. INR 1.96  continue current regimen 5. ID- Streptococcal pneumoniae bacteremia/aortitis- continue Rocephin/Rifampin Day 8/42 6. Dispo- doing very well, Home health arranged, Possibly d/c in 2-3 days.   LOS: 9 days    Danny Frederick 02/01/2014  I have seen and examined the patient and agree with the assessment and plan as outlined.  Hasn't walked today at all.  Weight has drifted up last 3-4 days, will resume low dose diuretic.  Restart low dose ACE-I and recheck labs tomorrow.  Possibly ready for d/c home in 2-3 days with home-health for physical therapy and home IV antibiotics.  Danny Frederick 02/01/2014 4:27 PM

## 2014-02-02 LAB — COMPREHENSIVE METABOLIC PANEL
ALBUMIN: 2 g/dL — AB (ref 3.5–5.2)
ALT: 28 U/L (ref 0–53)
AST: 43 U/L — AB (ref 0–37)
Alkaline Phosphatase: 169 U/L — ABNORMAL HIGH (ref 39–117)
BUN: 15 mg/dL (ref 6–23)
CALCIUM: 8.3 mg/dL — AB (ref 8.4–10.5)
CO2: 23 mEq/L (ref 19–32)
CREATININE: 0.69 mg/dL (ref 0.50–1.35)
Chloride: 97 mEq/L (ref 96–112)
GFR calc Af Amer: >90 mL/min (ref >90)
GFR calc non Af Amer: 89 mL/min — ABNORMAL LOW (ref >90)
Glucose, Bld: 87 mg/dL (ref 70–99)
Potassium: 3.7 mEq/L (ref 3.7–5.3)
SODIUM: 133 meq/L — AB (ref 137–147)
Total Bilirubin: 0.7 mg/dL (ref 0.3–1.2)
Total Protein: 6 g/dL (ref 6.0–8.3)

## 2014-02-02 LAB — CBC
HCT: 33 % — ABNORMAL LOW (ref 39.0–52.0)
Hemoglobin: 11.3 g/dL — ABNORMAL LOW (ref 13.0–17.0)
MCH: 28.8 pg (ref 26.0–34.0)
MCHC: 34.2 g/dL (ref 30.0–36.0)
MCV: 84.2 fL (ref 78.0–100.0)
PLATELETS: 502 10*3/uL — AB (ref 150–400)
RBC: 3.92 MIL/uL — AB (ref 4.22–5.81)
RDW: 16.1 % — AB (ref 11.5–15.5)
WBC: 15 10*3/uL — ABNORMAL HIGH (ref 4.0–10.5)

## 2014-02-02 LAB — PRO B NATRIURETIC PEPTIDE: PRO B NATRI PEPTIDE: 3712 pg/mL — AB (ref 0–450)

## 2014-02-02 LAB — PREALBUMIN: Prealbumin: 9.1 mg/dL — ABNORMAL LOW (ref 17.0–34.0)

## 2014-02-02 LAB — PROTIME-INR
INR: 1.82 — ABNORMAL HIGH (ref 0.00–1.49)
PROTHROMBIN TIME: 20.5 s — AB (ref 11.6–15.2)

## 2014-02-02 MED ORDER — TRAMADOL HCL 50 MG PO TABS
50.0000 mg | ORAL_TABLET | ORAL | Status: DC | PRN
  Administered 2014-02-02 – 2014-02-04 (×5): 50 mg via ORAL
  Filled 2014-02-02 (×5): qty 1

## 2014-02-02 MED ORDER — METOPROLOL TARTRATE 25 MG PO TABS
37.5000 mg | ORAL_TABLET | Freq: Two times a day (BID) | ORAL | Status: DC
  Administered 2014-02-02 – 2014-02-04 (×5): 37.5 mg via ORAL
  Filled 2014-02-02 (×6): qty 1

## 2014-02-02 MED ORDER — ACETAMINOPHEN 325 MG PO TABS
650.0000 mg | ORAL_TABLET | Freq: Four times a day (QID) | ORAL | Status: DC | PRN
  Administered 2014-02-02 – 2014-02-04 (×2): 650 mg via ORAL
  Filled 2014-02-02 (×3): qty 2

## 2014-02-02 MED ORDER — WARFARIN SODIUM 2 MG PO TABS
2.0000 mg | ORAL_TABLET | Freq: Every day | ORAL | Status: DC
  Administered 2014-02-02 – 2014-02-03 (×2): 2 mg via ORAL
  Filled 2014-02-02 (×3): qty 1

## 2014-02-02 NOTE — Progress Notes (Addendum)
[address]       [place] [address]             [phone]      10 Days Post-Op Procedure(s) (LRB): THORACIC ASCENDING ANEURYSM REPAIR (AAA) (N/A) Subjective: conts to feel better  Objective: Vital signs in last 24 hours: Temp:  [97.9 F (36.6 C)-98.4 F (36.9 C)] 98.4 F (36.9 C) (06/04 0700) Pulse Rate:  [100-102] 102 (06/04 0700) Cardiac Rhythm:  [-] Sinus tachycardia (06/03 2251) Resp:  [18-19] 18 (06/04 0700) BP: (130-133)/(85-91) 130/91 mmHg (06/04 0700) SpO2:  [92 %-98 %] 92 % (06/04 0700) Weight:  [132 lb 0.9 oz (59.9 kg)] 132 lb 0.9 oz (59.9 kg) (06/04 0627)  Hemodynamic parameters for last 24 hours:    Intake/Output from previous day: 06/03 0701 - 06/04 0700 In: 650 [P.O.:480; I.V.:120; IV Piggyback:50] Out: 776 [Urine:775; Stool:1] Intake/Output this shift:    General appearance: alert, cooperative and no distress Heart: regular rate and rhythm and tachy Lungs: min dim in the bases Abdomen: benign Extremities: no edema Wound: incis healing well  Lab Results:  Recent Labs  01/31/14 0555 02/02/14 0415  WBC 14.9* 15.0*  HGB 11.3* 11.3*  HCT 33.6* 33.0*  PLT 395 502*   BMET:  Recent Labs  01/31/14 0555 02/02/14 0415  NA 134* 133*  K 3.9 3.7  CL 98 97  CO2 26 23  GLUCOSE 93 87  BUN 22 15  CREATININE 0.74 0.69  CALCIUM 8.4 8.3*    PT/INR:  Recent Labs  02/02/14 0415  LABPROT 20.5*  INR 1.82*   ABG    Component Value Date/Time   PHART 7.551* 01/27/2014 0509   HCO3 32.6* 01/27/2014 0509   TCO2 34 01/27/2014 0509   ACIDBASEDEF 1.0 01/24/2014 1925   O2SAT 96.0 01/27/2014 0509   CBG (last 3)  No results found for this basename: GLUCAP,  in the last 72 hours Scheduled Meds: . antiseptic oral rinse  15 mL Mouth Rinse q12n4p  . aspirin EC  81 mg Oral Daily  . cefTRIAXone (ROCEPHIN)  IV  2 g Intravenous Q24H  . citalopram  10 mg Oral Daily  . feeding supplement (RESOURCE BREEZE)  1 Container Oral TID BM  .  furosemide  20 mg Oral Daily  . levothyroxine  50 mcg Oral QAC breakfast  . lisinopril  10 mg Oral Daily  . loratadine  10 mg Oral Daily  . metoprolol tartrate  25 mg Oral BID  . pantoprazole  40 mg Oral Daily  . potassium chloride  10 mEq Oral Daily  . rifampin  300 mg Oral Daily  . sodium chloride  10-40 mL Intracatheter Q12H  . sodium chloride  3 mL Intravenous Q12H  . sodium chloride  3 mL Intravenous Q12H  . warfarin  1 mg Oral q1800  . Warfarin - Physician Dosing Inpatient   Does not apply q1800   Continuous Infusions:  PRN Meds:.sodium chloride, Gerhardt's butt cream, Hypromellose, labetalol, ondansetron (ZOFRAN) IV, potassium chloride, sodium chloride, sodium chloride, sodium chloride  Dg Chest 2 View  01/31/2014   CLINICAL DATA:  Follow-up atelectasis, weakness.  EXAM: CHEST  2 VIEW  COMPARISON:  01/30/2014  FINDINGS: Prior CABG. Right PICC line remains in place, unchanged. Small bilateral pleural effusions, left slightly larger than right with bibasilar atelectasis. Aeration in the lung bases improved since prior study. No pneumothorax. Mild emphysematous changes in the lungs.  IMPRESSION: Small bilateral effusions and bibasilar atelectasis, improving  since prior study.   Electronically Signed   By: Rolm Baptise M.D.   On: 01/31/2014 08:45   Assessment/Plan: S/P Procedure(s) (LRB): THORACIC ASCENDING ANEURYSM REPAIR (AAA) (N/A)  1 doing well 2 will increase beta blocker dose for tachycardia 3 inr dropped a little , will go to 2 mg a day 4 cont gentle diuresis 5 abx plans as outlined    LOS: 10 days    John Giovanni 02/02/2014  I have seen and examined the patient and agree with the assessment and plan as outlined.  Tentatively plan d/c home tomorrow w/ home health for IV antibiotics, PT and nursing care.  Rexene Alberts 02/02/2014 11:58 AM

## 2014-02-02 NOTE — Progress Notes (Signed)
Ambulated with patient using rolling walker approximately 290ft.  Patient walked at a slow, steady pace. Patient tolerated ambulation well and returned to bed resting. Will continue to monitor.

## 2014-02-02 NOTE — Progress Notes (Signed)
CARDIAC REHAB PHASE I   PRE:  Rate/Rhythm: 106 ST  BP:  Supine: 140/80  Sitting:   Standing:    SaO2: 93%RA  MODE:  Ambulation: 324 ft   POST:  Rate/Rhythm: 120ST  BP:  Supine: 130/80  Sitting:   Standing:    SaO2: 96%RA 1040-1107 Pt walked 324 ft on RA with rolling walker and minimal asst. Tolerated well. Discussed with pt that we will educatee tomorrow if for discharge. Family not here at this time. To bed after walk. Stated he does not find recliner comfortable.   Graylon Good, RN BSN  02/02/2014 11:05 AM

## 2014-02-03 DIAGNOSIS — F329 Major depressive disorder, single episode, unspecified: Secondary | ICD-10-CM

## 2014-02-03 DIAGNOSIS — R45851 Suicidal ideations: Secondary | ICD-10-CM

## 2014-02-03 LAB — PROTIME-INR
INR: 1.85 — AB (ref 0.00–1.49)
PROTHROMBIN TIME: 20.8 s — AB (ref 11.6–15.2)

## 2014-02-03 MED ORDER — MIRTAZAPINE 7.5 MG PO TABS
7.5000 mg | ORAL_TABLET | Freq: Every day | ORAL | Status: DC
  Administered 2014-02-03: 7.5 mg via ORAL
  Filled 2014-02-03 (×2): qty 1

## 2014-02-03 MED ORDER — CITALOPRAM HYDROBROMIDE 10 MG PO TABS
10.0000 mg | ORAL_TABLET | Freq: Once | ORAL | Status: DC
  Administered 2014-02-03: 10 mg via ORAL
  Filled 2014-02-03: qty 1

## 2014-02-03 MED ORDER — CITALOPRAM HYDROBROMIDE 20 MG PO TABS
20.0000 mg | ORAL_TABLET | Freq: Every day | ORAL | Status: DC
  Administered 2014-02-04: 20 mg via ORAL
  Filled 2014-02-03: qty 1

## 2014-02-03 NOTE — Progress Notes (Signed)
Physical Therapy Treatment Patient Details Name: Danny Frederick MRN: [id number] DOB: [birth year] Today's Date: March 01, 2014    History of Present Illness Pt adm with ruptured thoracic aortic aneurysm and underwent repair. PMH-lumbar fusion ~9 months ago, HTN    PT Comments    Pt not doing as well as today.   Follow Up Recommendations  Home health PT;Supervision/Assistance - 24 hour     Equipment Recommendations  None recommended by PT    Recommendations for Other Services       Precautions / Restrictions Precautions Precautions: Sternal;Fall    Mobility  Bed Mobility                  Transfers Overall transfer level: Needs assistance Equipment used: Rolling walker (2 wheeled) Transfers: Sit to/from Stand Sit to Stand: Supervision         General transfer comment: Verbal cues for technique of placing hands on knees to remember sternal precautions.   Ambulation/Gait Ambulation/Gait assistance: Min guard Ambulation Distance (Feet): 100 Feet Assistive device: Rolling walker (2 wheeled) Gait Pattern/deviations: Step-through pattern;Decreased stride length     General Gait Details: Pt needed verbal encouragement to mobilize.   Stairs            Wheelchair Mobility    Modified Rankin (Stroke Patients Only)       Balance   Sitting-balance support: No upper extremity supported;Feet supported Sitting balance-Leahy Scale: Good     Standing balance support: Bilateral upper extremity supported Standing balance-Leahy Scale: Poor Standing balance comment: Using walker for support.                    Cognition Arousal/Alertness: Awake/alert Behavior During Therapy: Flat affect Overall Cognitive Status: Within Functional Limits for tasks assessed                      Exercises      General Comments        Pertinent Vitals/Pain Pt reports rt shoulder pain.    Home Living                      Prior Function            PT Goals (current goals can now be found in the care plan section) Progress towards PT goals: Progressing toward goals    Frequency  Min 3X/week    PT Plan Current plan remains appropriate    Co-evaluation             End of Session Equipment Utilized During Treatment: Gait belt Activity Tolerance: Patient limited by fatigue Patient left: in chair;with call bell/phone within reach     Time: 1005-1015 PT Time Calculation (min): 10 min  Charges:  $Gait Training: 8-22 mins                    G Codes:      [place] 03-01-2014, 12:33 PM  [employer] PT [phone]

## 2014-02-03 NOTE — Progress Notes (Signed)
[facility]  Patient Status:  New pt for [facility]  [facility] is providing the following services: HHRN and [facility] for home IV ABX.  If patient discharges after hours, please call [phone].   Larry Sierras 02/03/2014, 5:30 PM

## 2014-02-03 NOTE — Progress Notes (Signed)
Patient ambulated 135ft in hallway with RW. Pt tolerated ambulation.  Vs stable.Will continue to monitor.   Evangeline Utley,MSN,RN

## 2014-02-03 NOTE — Progress Notes (Addendum)
      [address]       [place] [address]             [phone]      11 Days Post-Op Procedure(s) (LRB): THORACIC ASCENDING ANEURYSM REPAIR (AAA) (N/A)  Subjective:  Danny Frederick states he isn't doing well this morning.  He was looking forward to going home today.  However he developed pain in his right shoulder area with some occasional numbness down his arm.  This started last night and Tramadol did provide some relief.  Objective: Vital signs in last 24 hours: Temp:  [98.2 F (36.8 C)-99.8 F (37.7 C)] 99.8 F (37.7 C) (06/05 0630) Pulse Rate:  [97-105] 105 (06/05 0630) Cardiac Rhythm:  [-] Sinus tachycardia (06/05 0800) Resp:  [18] 18 (06/05 0630) BP: (116-131)/(77-86) 131/85 mmHg (06/05 0630) SpO2:  [95 %-98 %] 95 % (06/05 0630) Weight:  [133 lb 13.1 oz (60.7 kg)] 133 lb 13.1 oz (60.7 kg) (06/05 0500)  Intake/Output from previous day: 06/04 0701 - 06/05 0700 In: 483 [P.O.:480; I.V.:3] Out: 300 [Urine:300]  General appearance: alert, cooperative and no distress Heart: regular rate and rhythm Lungs: clear to auscultation bilaterally Abdomen: soft, non-tender; bowel sounds normal; no masses,  no organomegaly Extremities: edema none appreciated Wound: clean and dry, some ecchymosis at axillary cannulation site  Lab Results:  Recent Labs  02/02/14 0415  WBC 15.0*  HGB 11.3*  HCT 33.0*  PLT 502*   BMET:  Recent Labs  02/02/14 0415  NA 133*  K 3.7  CL 97  CO2 23  GLUCOSE 87  BUN 15  CREATININE 0.69  CALCIUM 8.3*    PT/INR:  Recent Labs  02/03/14 0455  LABPROT 20.8*  INR 1.85*   ABG    Component Value Date/Time   PHART 7.551* 01/27/2014 0509   HCO3 32.6* 01/27/2014 0509   TCO2 34 01/27/2014 0509   ACIDBASEDEF 1.0 01/24/2014 1925   O2SAT 96.0 01/27/2014 0509   CBG (last 3)  No results found for this basename: GLUCAP,  in the last 72 hours  Assessment/Plan: S/P Procedure(s) (LRB): THORACIC ASCENDING ANEURYSM REPAIR (AAA)  (N/A)  1. CV- NSR continue Lopressor 2. Pulm- no issues continue IS at discharge 3. Renal- weight is relatively stable, no diuretics at this time 4. INR 1.85, will repeat dose of Coumadin, likely 1 mg daily 5. ID- Streptococcal pneumoniae bacteremia/aortitis- Continue Rocephin/Rifampin Day 10/42 6. Right shoulder pain- with associated numbness in wrist and hand- likely brachial plexus injury 7. Dispo- patient is doing well, new onset shoulder pain, possibly d/c home today   LOS: 11 days    Erin Barrett 02/03/2014  I have seen and examined the patient.  Shoulder pain and numbness is not unexpended, and likely related to incision for axillary cannulation.  However, Danny Frederick is again expressing a desire to "give up" and "end his life".  He is talking about refusing all food, water, and medical care.  He has intermittently made comments consistent with likely underlying major depression, although up until this morning he has seemed in much better spirits as he has gradually improved from his surgery and underlying medical problems.  Will contact his family and hold plans for discharge.  Will ask Psychiatry team to see him in consultation.  Rexene Alberts 02/03/2014 9:47 AM

## 2014-02-03 NOTE — Progress Notes (Signed)
CARDIAC REHAB PHASE I   PRE:  Rate/Rhythm: 91 SR    BP: sitting 120/60    SaO2: 91 RA  MODE:  Ambulation: 270 ft   POST:  Rate/Rhythm: 94     BP: sitting 120/80     SaO2: 94 RA  Pt willing to get OOB. Able to walk with better steps however sts he just can't go further, that his breathing isn't right. SaO2 is lower today than it has been and when asked, realized pt has not been doing IS at all. Had him practice, which he is only inspiring 700 mL. Previous marker is set 1000 mL. Family is upset that they didn't know to do IS and also with his new depression. Attempted to encourage pt however he is overwhelmed. Return to bed. Will f/u in am. [place], ACSM 02/03/2014 3:37 PM

## 2014-02-03 NOTE — Consult Note (Signed)
[facility] Face-to-Face Psychiatry Consult   Reason for Consult:  Depression and suicidal ideation Referring Physician:  Dr. Jones Broom Danny is an 80 y.o. male. Total Time spent with patient: 45 minutes  Assessment: AXIS I:  Major Depression, single episode AXIS II:  Deferred AXIS III:   Past Medical History  Diagnosis Date  . Hypertension   . Unspecified hypothyroidism   . Mitral valve prolapse     h/o; normal echo 12/2011 with no MVP seen  . Foot drop, right   . BPH (benign prostatic hypertrophy)   . Iron deficiency anemia, unspecified 6/09  . Hearing loss in left ear   . Diverticulosis of colon 12/09  . Internal hemorrhoids   . GERD (gastroesophageal reflux disease)   . Allergic rhinitis, cause unspecified     on allergy shots (Dr. Orvil Feil)  . Allergic conjunctivitis   . Ruptured thoracic aortic aneurysm 01/23/2014  . 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 Right ventricular dysfunction 01/23/2014    Secondary to obstruction of main pulmonary artery   AXIS IV:  other psychosocial or environmental problems, problems related to social environment and problems with primary support group AXIS V:  51-60 moderate symptoms  Plan:  No evidence of imminent risk to self or others at present.   Patient does not meet criteria for psychiatric inpatient admission. Supportive therapy provided about ongoing stressors. Discussed crisis plan, support from social network, calling 911, coming to the Emergency Department, and calling Suicide Hotline. Start Remeron 7.5 mg PO Qhs for insomnia, increase celexa 20 mg QD starting tomorrow and celexa 10 mg once for now. Psychiatric consult will follow up as clinically required. may contact [phone] if needs further assistance.  Subjective:   Danny Frederick is a 80 y.o. male patient admitted with depression.  HPI: Patient is seen face-to-face for psychiatric evaluation for depression and suicidal ideation. Patient endorses symptoms of depression  especially feeling sad, low energy, anxiety, tired, disturbed sleep and appetite and positive suicidal ideation. Patient denies active suicidal ideation, intents or plans. Patient has no evidence of onset ideation, intention or plan. Patient has no evidence of psychosis. Patient reported he has been healthy until a few weeks ago. Patient reportedly seeing a primary care physician who started antidepressant medication Celexa 10 mg daily which has limited clinical response at this time. Patient wife and daughter and son-in-law are at bedside. Patient reported his brother killed himself after he was suffered at the cancer which relapsed when he was 80 years old. Patient has no personal history of mental illness or acute psychiatric hospitalization. Patient contracts for safety and has been hopeful that is going to get better and also has a habit and support from the family members.   Medical history: This is a 80 year old male with history of hypertension, GE reflux disease, benign prostatic hypertrophy, and degenerative disc disease of the cervical and lumbar spine with otherwise been very healthy for most of his life and physically very active. Beginning in January of this year the patient began to experience progressive generalized weakness and fatigue. He reported occasional double substernal chest pain, but this was relatively mild in severity. He was treated for depression. 6 weeks ago while traveling in [place] he was evaluated in the emergency room for severe weakness. No specific diagnosis was discovered. Over the past 2 weeks the patient has developed progressive exertional shortness of breath. He now get short of breath with minimal exertion. He denies resting shortness of breath. He denies ongoing chest pain.  He has not had fevers or chills. He has not had any dizzy spells or syncope. He has not had any hoarseness of his voice. He denies any productive cough, hemoptysis, or wheezing. He has had  poor appetite and he thinks he has lost 10 pounds of weight. Symptoms have become acutely worse over the last few days, prompting his family to urgently go to the emergency room. He was initially evaluated in the emergency department at [facility]. The area was notably tachycardic and mildly hypotensive. He had low-grade fever. Blood work was notable for leukocytosis and mild lactic acidosis. Chest x-ray demonstrated widening mediastinum with prominence of the left hilum. A CT angiogram of the chest was performed to rule out pulmonary embolus. This reveals a large aneurysm of the distal ascending thoracic aorta that appears to have contained rupture. There is blood in the anterior mediastinum. The aneurysm compresses the pulmonary artery. There is a small pulmonary embolus. The patient was sent directly to [facility] emergency department for cardiothoracic surgical consultation.  The patient reports feeling generalized weak. He denies any ongoing chest pain. He reports severe exertional shortness of breath. He denies resting shortness breath. He denies fevers or chills. He denies any productive cough, hemoptysis, wheezing. He has not had any headaches, dizzy spells, or syncope. He lives at home with his wife. He has remained physically active all of his life and up until January he exercised on a daily basis.  HPI Elements:   Location:  depression. Quality:  acute. Severity:  suicidal thoughts. Timing:  medical problems.  Past Psychiatric History: Past Medical History  Diagnosis Date  . Hypertension   . Unspecified hypothyroidism   . Mitral valve prolapse     h/o; normal echo 12/2011 with no MVP seen  . Foot drop, right   . BPH (benign prostatic hypertrophy)   . Iron deficiency anemia, unspecified 6/09  . Hearing loss in left ear   . Diverticulosis of colon 12/09  . Internal hemorrhoids   . GERD (gastroesophageal reflux disease)   . Allergic rhinitis, cause unspecified      on allergy shots (Dr. Orvil Feil)  . Allergic conjunctivitis   . Ruptured thoracic aortic aneurysm 01/23/2014  . 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 Right ventricular dysfunction 01/23/2014    Secondary to obstruction of main pulmonary artery    reports that he quit smoking about 36 years ago. His smoking use included Cigarettes. He smoked 0.00 packs per day. He has never used smokeless tobacco. He reports that he drinks alcohol. He reports that he does not use illicit drugs. Family History  Problem Relation Age of Onset  . Diabetes Mother   . Heart disease Mother     CHF  . Stroke Father   . Parkinsonism Father   . Cancer Brother     bladder cancer  . Cancer Brother     prostate cancer  . Emphysema Brother   . Diabetes Sister   . Colon cancer Neg Hx      Living Arrangements: Spouse/significant other   Abuse/Neglect [facility]) Physical Abuse: Denies Verbal Abuse: Denies Sexual Abuse: Denies Allergies:  No Known Allergies  ACT Assessment Complete:  NO Objective: Blood pressure 122/84, pulse 88, temperature 98.7 F (37.1 C), temperature source Oral, resp. rate 16, height 5' 3"  (1.6 m), weight 60.7 kg (133 lb 13.1 oz), SpO2 92.00%.Body mass index is 23.71 kg/(m^2). Results for orders placed during the hospital encounter of 01/23/14 (from the past 72 hour(s))  PROTIME-INR     Status: Abnormal   Collection Time    02/01/14  6:35 AM      Result Value Ref Range   Prothrombin Time 21.7 (*) 11.6 - 15.2 seconds   INR 1.96 (*) 0.00 - 1.49  PROTIME-INR     Status: Abnormal   Collection Time    02/02/14  4:15 AM      Result Value Ref Range   Prothrombin Time 20.5 (*) 11.6 - 15.2 seconds   INR 1.82 (*) 0.00 - 1.49  CBC     Status: Abnormal   Collection Time    02/02/14  4:15 AM      Result Value Ref Range   WBC 15.0 (*) 4.0 - 10.5 K/uL   RBC 3.92 (*) 4.22 - 5.81 MIL/uL   Hemoglobin 11.3 (*) 13.0 - 17.0 g/dL   HCT 33.0 (*) 39.0 - 52.0 %   MCV 84.2  78.0 - 100.0 fL   MCH 28.8   26.0 - 34.0 pg   MCHC 34.2  30.0 - 36.0 g/dL   RDW 16.1 (*) 11.5 - 15.5 %   Platelets 502 (*) 150 - 400 K/uL  COMPREHENSIVE METABOLIC PANEL     Status: Abnormal   Collection Time    02/02/14  4:15 AM      Result Value Ref Range   Sodium 133 (*) 137 - 147 mEq/L   Potassium 3.7  3.7 - 5.3 mEq/L   Chloride 97  96 - 112 mEq/L   CO2 23  19 - 32 mEq/L   Glucose, Bld 87  70 - 99 mg/dL   BUN 15  6 - 23 mg/dL   Creatinine, Ser 0.69  0.50 - 1.35 mg/dL   Calcium 8.3 (*) 8.4 - 10.5 mg/dL   Total Protein 6.0  6.0 - 8.3 g/dL   Albumin 2.0 (*) 3.5 - 5.2 g/dL   AST 43 (*) 0 - 37 U/L   ALT 28  0 - 53 U/L   Alkaline Phosphatase 169 (*) 39 - 117 U/L   Total Bilirubin 0.7  0.3 - 1.2 mg/dL   GFR calc non Af Amer 89 (*) >90 mL/min   GFR calc Af Amer >90  >90 mL/min   Comment: (NOTE)     The eGFR has been calculated using the CKD EPI equation.     This calculation has not been validated in all clinical situations.     eGFR's persistently <90 mL/min signify possible Chronic Kidney     Disease.  PREALBUMIN     Status: Abnormal   Collection Time    02/02/14  4:15 AM      Result Value Ref Range   Prealbumin 9.1 (*) 17.0 - 34.0 mg/dL   Comment: Performed at [facility]     Status: Abnormal   Collection Time    02/02/14  4:15 AM      Result Value Ref Range   Pro B Natriuretic peptide (BNP) 3712.0 (*) 0 - 450 pg/mL  PROTIME-INR     Status: Abnormal   Collection Time    02/03/14  4:55 AM      Result Value Ref Range   Prothrombin Time 20.8 (*) 11.6 - 15.2 seconds   INR 1.85 (*) 0.00 - 1.49   Labs are reviewed and are pertinent for .  Current Facility-Administered Medications  Medication Dose Route Frequency Provider Last Rate Last Dose  . 0.9 %  sodium chloride infusion  250 mL Intravenous PRN Rexene Alberts, MD 250 mL/hr at 01/29/14 0845 250 mL at 01/29/14 0845  . acetaminophen (TYLENOL) tablet 650 mg  650 mg Oral Q6H PRN Ivin Poot, MD   650 mg at  02/02/14 1902  . antiseptic oral rinse (BIOTENE) solution 15 mL  15 mL Mouth Rinse q12n4p Rexene Alberts, MD   15 mL at 02/03/14 1600  . aspirin EC tablet 81 mg  81 mg Oral Daily Rexene Alberts, MD   81 mg at 02/03/14 1220  . cefTRIAXone (ROCEPHIN) 2 g in dextrose 5 % 50 mL IVPB  2 g Intravenous Q24H Campbell Riches, MD   2 g at 02/03/14 1605  . citalopram (CELEXA) tablet 10 mg  10 mg Oral Daily Rexene Alberts, MD   10 mg at 02/03/14 1221  . feeding supplement (RESOURCE BREEZE) (RESOURCE BREEZE) liquid 1 Container  1 Container Oral TID BM Rogue Bussing, RD   1 Container at 02/02/14 1130  . furosemide (LASIX) tablet 20 mg  20 mg Oral Daily Rexene Alberts, MD   20 mg at 02/03/14 1220  . Gerhardt's butt cream   Topical PRN Rexene Alberts, MD      . Hypromellose 0.3 % GEL 1 application  1 application Ophthalmic PRN Rexene Alberts, MD      . labetalol (NORMODYNE,TRANDATE) injection 10 mg  10 mg Intravenous Q2H PRN Rexene Alberts, MD   10 mg at 01/30/14 2205  . levothyroxine (SYNTHROID, LEVOTHROID) tablet 50 mcg  50 mcg Oral QAC breakfast Rexene Alberts, MD   50 mcg at 02/03/14 0803  . lisinopril (PRINIVIL,ZESTRIL) tablet 10 mg  10 mg Oral Daily Rexene Alberts, MD   10 mg at 02/03/14 1220  . loratadine (CLARITIN) tablet 10 mg  10 mg Oral Daily Erin Barrett, PA-C   10 mg at 02/03/14 1220  . metoprolol tartrate (LOPRESSOR) tablet 37.5 mg  37.5 mg Oral BID Wayne E Gold, PA-C   37.5 mg at 02/03/14 1220  . ondansetron (ZOFRAN) injection 4 mg  4 mg Intravenous Q6H PRN Rexene Alberts, MD   4 mg at 01/25/14 1610  . pantoprazole (PROTONIX) EC tablet 40 mg  40 mg Oral Daily Rexene Alberts, MD   40 mg at 02/03/14 1221  . potassium chloride (K-DUR,KLOR-CON) CR tablet 10 mEq  10 mEq Oral Daily Rexene Alberts, MD   10 mEq at 02/03/14 1220  . potassium chloride 10 mEq in 50 mL *CENTRAL LINE* IVPB  10 mEq Intravenous Q1H PRN Rexene Alberts, MD   10 mEq at 01/28/14 0849  . rifampin (RIFADIN)  capsule 300 mg  300 mg Oral Daily Campbell Riches, MD   300 mg at 02/03/14 1220  . sodium chloride 0.9 % injection 10-40 mL  10-40 mL Intracatheter Q12H Arloa Koh, RN   10 mL at 02/02/14 1000  . sodium chloride 0.9 % injection 10-40 mL  10-40 mL Intracatheter PRN Arloa Koh, RN   10 mL at 02/03/14 1734  . sodium chloride 0.9 % injection 3 mL  3 mL Intravenous Q12H Rexene Alberts, MD   3 mL at 01/31/14 2136  . sodium chloride 0.9 % injection 3 mL  3 mL Intravenous PRN Rexene Alberts, MD      . sodium chloride 0.9 % injection 3 mL  3 mL Intravenous Q12H Rexene Alberts, MD   3 mL at 02/02/14 2210  .  sodium chloride 0.9 % injection 3 mL  3 mL Intravenous PRN Rexene Alberts, MD      . traMADol Veatrice Bourbon) tablet 50 mg  50 mg Oral Q4H PRN Rexene Alberts, MD   50 mg at 02/03/14 0803  . warfarin (COUMADIN) tablet 2 mg  2 mg Oral q1800 Wayne E Gold, PA-C   2 mg at 02/03/14 1828  . Warfarin - Physician Dosing Inpatient   Does not apply q1800 Sindy Guadeloupe, [facility]        Psychiatric Specialty Exam: Physical Exam  ROS  Blood pressure 122/84, pulse 88, temperature 98.7 F (37.1 C), temperature source Oral, resp. rate 16, height 5' 3"  (1.6 m), weight 60.7 kg (133 lb 13.1 oz), SpO2 92.00%.Body mass index is 23.71 kg/(m^2).  General Appearance: Guarded  Eye Contact::  Good  Speech:  Clear and Coherent and Slow  Volume:  Decreased  Mood:  Depressed  Affect:  Depressed and Flat  Thought Process:  Coherent and Goal Directed  Orientation:  Full (Time, Place, and Person)  Thought Content:  WDL  Suicidal Thoughts:  Yes.  without intent/plan  Homicidal Thoughts:  No  Memory:  Immediate;   Good  Judgement:  Fair  Insight:  Fair and Lacking  Psychomotor Activity:  Decreased  Concentration:  Fair  Recall:  Good  Fund of Knowledge:Good  Language: Good  Akathisia:  NA  Handed:  Right  AIMS (if indicated):     Assets:  Communication Skills Desire for Improvement Financial  Resources/Insurance Housing Intimacy Leisure Time Resilience Social Support Talents/Skills Transportation  Sleep:      Musculoskeletal: Strength & Muscle Tone: within normal limits Gait & Station: unable to stand Patient leans: N/A  Treatment Plan Summary: Daily contact with patient to assess and evaluate symptoms and progress in treatment Medication management  Durward Parcel 02/03/2014 6:30 PM

## 2014-02-04 LAB — PROTIME-INR
INR: 2.63 — AB (ref 0.00–1.49)
PROTHROMBIN TIME: 27.2 s — AB (ref 11.6–15.2)

## 2014-02-04 MED ORDER — POTASSIUM CHLORIDE CRYS ER 10 MEQ PO TBCR: 10.0000 meq | EXTENDED_RELEASE_TABLET | Freq: Every day | ORAL | Status: DC

## 2014-02-04 MED ORDER — RIFAMPIN 300 MG PO CAPS: 300.0000 mg | ORAL_CAPSULE | Freq: Every day | ORAL | Status: DC

## 2014-02-04 MED ORDER — HEPARIN SOD (PORK) LOCK FLUSH 100 UNIT/ML IV SOLN: 250.0000 [IU] | INTRAVENOUS | Status: DC | PRN

## 2014-02-04 MED ORDER — METOPROLOL TARTRATE 25 MG PO TABS: 37.5000 mg | ORAL_TABLET | Freq: Two times a day (BID) | ORAL | Status: DC

## 2014-02-04 MED ORDER — MIRTAZAPINE 7.5 MG PO TABS: 7.5000 mg | ORAL_TABLET | Freq: Every day | ORAL | Status: DC

## 2014-02-04 MED ORDER — HEPARIN SOD (PORK) LOCK FLUSH 100 UNIT/ML IV SOLN
250.0000 [IU] | INTRAVENOUS | Status: AC | PRN
  Administered 2014-02-04: 250 [IU]

## 2014-02-04 MED ORDER — DEXTROSE 5 % IV SOLN: 2.0000 g | INTRAVENOUS | Status: DC

## 2014-02-04 MED ORDER — FUROSEMIDE 20 MG PO TABS: 20.0000 mg | ORAL_TABLET | Freq: Every day | ORAL | Status: DC

## 2014-02-04 MED ORDER — TRAMADOL HCL 50 MG PO TABS: 50.0000 mg | ORAL_TABLET | ORAL | Status: DC | PRN

## 2014-02-04 MED ORDER — CITALOPRAM HYDROBROMIDE 20 MG PO TABS: 20.0000 mg | ORAL_TABLET | Freq: Every day | ORAL | Status: DC

## 2014-02-04 MED ORDER — WARFARIN SODIUM 1 MG PO TABS: ORAL_TABLET | ORAL | Status: DC

## 2014-02-04 MED ORDER — LISINOPRIL 10 MG PO TABS: 10.0000 mg | ORAL_TABLET | Freq: Every day | ORAL | Status: DC

## 2014-02-04 NOTE — Progress Notes (Addendum)
       [address]       [place] [address]             [phone]          12 Days Post-Op Procedure(s) (LRB): THORACIC ASCENDING ANEURYSM REPAIR (AAA) (N/A)  Subjective: In much better spirits today.  Rested well last night. Shoulder feeling better.   Objective: Vital signs in last 24 hours: Patient Vitals for the past 24 hrs:  BP Temp Temp src Pulse Resp SpO2 Weight  02/04/14 0545 147/83 mmHg 97.7 F (36.5 C) Oral 89 18 94 % 133 lb 6.1 oz (60.5 kg)  02/03/14 2035 113/73 mmHg 98.3 F (36.8 C) Oral 102 18 94 % -  02/03/14 1355 122/84 mmHg 98.7 F (37.1 C) Oral 88 16 92 % -   Current Weight  02/04/14 133 lb 6.1 oz (60.5 kg)     Intake/Output from previous day: 06/05 0701 - 06/06 0700 In: -  Out: 100 [Urine:100]    PHYSICAL EXAM:  Heart: RRR Lungs: Clear Wound: Clean and dry Extremities: No significant LE edema    Lab Results: CBC: Recent Labs  02/02/14 0415  WBC 15.0*  HGB 11.3*  HCT 33.0*  PLT 502*   BMET:  Recent Labs  02/02/14 0415  NA 133*  K 3.7  CL 97  CO2 23  GLUCOSE 87  BUN 15  CREATININE 0.69  CALCIUM 8.3*    PT/INR:  Recent Labs  02/04/14 0500  LABPROT 27.2*  INR 2.63*      Assessment/Plan: S/P Procedure(s) (LRB): THORACIC ASCENDING ANEURYSM REPAIR (AAA) (N/A) Doing well from surgical standpoint. INR bumped from 1.8->2.6 overnight- will hold Coumadin tonight and resume with 1 mg daily starting Sunday. Appreciate psych consult- meds adjusted and outpatient follow up recommended. No suicidal ideation present today and pt seems to be in better spirits overall.  Discussed with MD- Plan d/c home today with close outpatient followup.   LOS: 12 days    Coolidge Breeze 02/04/2014

## 2014-02-04 NOTE — Progress Notes (Addendum)
Ed completed with pt and family. Good reception with appropriate questions. N/A for CRPII as Medicare will not cover for his diagnosis. Offered Maintenance however pt plans to eventually go back to the gym. Pt is still struggling to lift right arm, sts he has pain in right shoulder when he does. Suggests HH OT c/s. [id number] Yves Dill CES, ACSM 10:39 AM 02/04/2014

## 2014-02-05 DIAGNOSIS — Z48812 Encounter for surgical aftercare following surgery on the circulatory system: Secondary | ICD-10-CM | POA: Diagnosis not present

## 2014-02-05 DIAGNOSIS — A403 Sepsis due to Streptococcus pneumoniae: Secondary | ICD-10-CM | POA: Diagnosis not present

## 2014-02-05 DIAGNOSIS — I10 Essential (primary) hypertension: Secondary | ICD-10-CM | POA: Diagnosis not present

## 2014-02-05 DIAGNOSIS — M5137 Other intervertebral disc degeneration, lumbosacral region: Secondary | ICD-10-CM | POA: Diagnosis not present

## 2014-02-05 DIAGNOSIS — Z7901 Long term (current) use of anticoagulants: Secondary | ICD-10-CM | POA: Diagnosis not present

## 2014-02-05 DIAGNOSIS — Z452 Encounter for adjustment and management of vascular access device: Secondary | ICD-10-CM | POA: Diagnosis not present

## 2014-02-05 DIAGNOSIS — N400 Enlarged prostate without lower urinary tract symptoms: Secondary | ICD-10-CM | POA: Diagnosis not present

## 2014-02-05 DIAGNOSIS — M5030 Other cervical disc degeneration, unspecified cervical region: Secondary | ICD-10-CM | POA: Diagnosis not present

## 2014-02-05 DIAGNOSIS — M216X9 Other acquired deformities of unspecified foot: Secondary | ICD-10-CM | POA: Diagnosis not present

## 2014-02-06 ENCOUNTER — Ambulatory Visit: Payer: Medicare Other | Admitting: Family Medicine

## 2014-02-07 ENCOUNTER — Ambulatory Visit (INDEPENDENT_AMBULATORY_CARE_PROVIDER_SITE_OTHER): Payer: Medicare Other | Admitting: Pharmacist

## 2014-02-07 DIAGNOSIS — I2699 Other pulmonary embolism without acute cor pulmonale: Secondary | ICD-10-CM

## 2014-02-07 DIAGNOSIS — Z7901 Long term (current) use of anticoagulants: Secondary | ICD-10-CM | POA: Diagnosis not present

## 2014-02-07 DIAGNOSIS — M5030 Other cervical disc degeneration, unspecified cervical region: Secondary | ICD-10-CM | POA: Diagnosis not present

## 2014-02-07 DIAGNOSIS — M216X9 Other acquired deformities of unspecified foot: Secondary | ICD-10-CM | POA: Diagnosis not present

## 2014-02-07 DIAGNOSIS — Z48812 Encounter for surgical aftercare following surgery on the circulatory system: Secondary | ICD-10-CM | POA: Diagnosis not present

## 2014-02-07 DIAGNOSIS — A403 Sepsis due to Streptococcus pneumoniae: Secondary | ICD-10-CM | POA: Diagnosis not present

## 2014-02-07 DIAGNOSIS — Z452 Encounter for adjustment and management of vascular access device: Secondary | ICD-10-CM | POA: Diagnosis not present

## 2014-02-07 DIAGNOSIS — I10 Essential (primary) hypertension: Secondary | ICD-10-CM | POA: Diagnosis not present

## 2014-02-07 LAB — POCT INR

## 2014-02-08 ENCOUNTER — Encounter: Payer: Self-pay | Admitting: Thoracic Surgery (Cardiothoracic Vascular Surgery)

## 2014-02-09 ENCOUNTER — Other Ambulatory Visit: Payer: Self-pay | Admitting: Thoracic Surgery (Cardiothoracic Vascular Surgery)

## 2014-02-09 DIAGNOSIS — Z452 Encounter for adjustment and management of vascular access device: Secondary | ICD-10-CM | POA: Diagnosis not present

## 2014-02-09 DIAGNOSIS — M5030 Other cervical disc degeneration, unspecified cervical region: Secondary | ICD-10-CM | POA: Diagnosis not present

## 2014-02-09 DIAGNOSIS — I2699 Other pulmonary embolism without acute cor pulmonale: Secondary | ICD-10-CM

## 2014-02-09 DIAGNOSIS — I10 Essential (primary) hypertension: Secondary | ICD-10-CM | POA: Diagnosis not present

## 2014-02-09 DIAGNOSIS — Z48812 Encounter for surgical aftercare following surgery on the circulatory system: Secondary | ICD-10-CM | POA: Diagnosis not present

## 2014-02-09 DIAGNOSIS — M216X9 Other acquired deformities of unspecified foot: Secondary | ICD-10-CM | POA: Diagnosis not present

## 2014-02-09 DIAGNOSIS — A403 Sepsis due to Streptococcus pneumoniae: Secondary | ICD-10-CM | POA: Diagnosis not present

## 2014-02-10 ENCOUNTER — Ambulatory Visit (INDEPENDENT_AMBULATORY_CARE_PROVIDER_SITE_OTHER): Payer: Medicare Other | Admitting: *Deleted

## 2014-02-10 DIAGNOSIS — A403 Sepsis due to Streptococcus pneumoniae: Secondary | ICD-10-CM | POA: Diagnosis not present

## 2014-02-10 DIAGNOSIS — Z452 Encounter for adjustment and management of vascular access device: Secondary | ICD-10-CM | POA: Diagnosis not present

## 2014-02-10 DIAGNOSIS — I10 Essential (primary) hypertension: Secondary | ICD-10-CM | POA: Diagnosis not present

## 2014-02-10 DIAGNOSIS — M5030 Other cervical disc degeneration, unspecified cervical region: Secondary | ICD-10-CM | POA: Diagnosis not present

## 2014-02-10 DIAGNOSIS — Z48812 Encounter for surgical aftercare following surgery on the circulatory system: Secondary | ICD-10-CM | POA: Diagnosis not present

## 2014-02-10 DIAGNOSIS — M216X9 Other acquired deformities of unspecified foot: Secondary | ICD-10-CM | POA: Diagnosis not present

## 2014-02-10 DIAGNOSIS — I2699 Other pulmonary embolism without acute cor pulmonale: Secondary | ICD-10-CM

## 2014-02-10 LAB — POCT INR: INR: 1.6

## 2014-02-13 ENCOUNTER — Ambulatory Visit (INDEPENDENT_AMBULATORY_CARE_PROVIDER_SITE_OTHER): Payer: Medicare Other | Admitting: Interventional Cardiology

## 2014-02-13 ENCOUNTER — Ambulatory Visit
Admission: RE | Admit: 2014-02-13 | Discharge: 2014-02-13 | Disposition: A | Discharge status: Home / Self Care | Payer: Medicare Other | Source: Ambulatory Visit | Attending: Thoracic Surgery (Cardiothoracic Vascular Surgery) | Admitting: Thoracic Surgery (Cardiothoracic Vascular Surgery)

## 2014-02-13 ENCOUNTER — Ambulatory Visit (INDEPENDENT_AMBULATORY_CARE_PROVIDER_SITE_OTHER): Payer: Self-pay | Admitting: Surgical

## 2014-02-13 VITALS — BP 123/85 | HR 111 | Resp 16 | Ht 63.0 in | Wt 121.1 lb

## 2014-02-13 DIAGNOSIS — Z09 Encounter for follow-up examination after completed treatment for conditions other than malignant neoplasm: Secondary | ICD-10-CM

## 2014-02-13 DIAGNOSIS — I10 Essential (primary) hypertension: Secondary | ICD-10-CM | POA: Diagnosis not present

## 2014-02-13 DIAGNOSIS — A403 Sepsis due to Streptococcus pneumoniae: Secondary | ICD-10-CM | POA: Diagnosis not present

## 2014-02-13 DIAGNOSIS — M216X9 Other acquired deformities of unspecified foot: Secondary | ICD-10-CM | POA: Diagnosis not present

## 2014-02-13 DIAGNOSIS — Z452 Encounter for adjustment and management of vascular access device: Secondary | ICD-10-CM | POA: Diagnosis not present

## 2014-02-13 DIAGNOSIS — I711 Thoracic aortic aneurysm, ruptured, unspecified: Secondary | ICD-10-CM

## 2014-02-13 DIAGNOSIS — I2699 Other pulmonary embolism without acute cor pulmonale: Secondary | ICD-10-CM

## 2014-02-13 DIAGNOSIS — Z48812 Encounter for surgical aftercare following surgery on the circulatory system: Secondary | ICD-10-CM | POA: Diagnosis not present

## 2014-02-13 DIAGNOSIS — M5030 Other cervical disc degeneration, unspecified cervical region: Secondary | ICD-10-CM | POA: Diagnosis not present

## 2014-02-13 DIAGNOSIS — J438 Other emphysema: Secondary | ICD-10-CM | POA: Diagnosis not present

## 2014-02-13 LAB — POCT INR: INR: 2.3

## 2014-02-13 NOTE — Progress Notes (Signed)
[address]       [place] [address]             [phone]                  Danny Frederick [facility] Medical Record #[id number] Date of Birth: November 03, 1933  Referring MD:Yao, Danny Bales, MD Primary Cardiology: Primary Care:KNAPP,Danny A, MD  Chief Complaint:  Follow Up Visit   History of Present Illness:    80 year old male status post repair of ruptured Frederick descending thoracic aortic aneurysm with 32 mm heme shield straight graft repair. This was done 01/24/2014 by Dr. Cornelius Frederick. It was noted that this was secondary in part to streptococcal bacteremia and he has been on long-term intravenous ceftriaxone which will be in place via PICC line 6 weeks postoperatively. Currently the patient is feeling like he is making steady progress but does remain fairly weak. He is ambulating fairly well with the assistance of Frederick walker. He denies shortness of breath. He is having no postoperative pain and not using any analgesics. He did have some in-hospital difficulty with depression but this appears to be stable and he is showing good moods without further suicidal ideations. His appetite is improved, although not normal. He is currently on Coumadin and most recent INR is 2.3.         Zubrod Score: At the time of surgery this patient's most appropriate activity status/level should be described as: []     0    Normal activity, no symptoms []     1    Restricted in physical strenuous activity but ambulatory, able to do out light work []     2    Ambulatory and capable of self care, unable to do work activities, up and about                 >50 % of waking hours                                                                                   []     3    Only limited self care, in bed greater than 50% of waking hours []     4    Completely disabled, no self care, confined to bed or chair []     5    Moribund  History  Smoking status  . Former Smoker  . Types: Cigarettes  . Quit date:  09/01/1977  Smokeless tobacco  . Never Used       No Known Allergies  Current Outpatient Prescriptions  Medication Sig Dispense Refill  . aspirin 81 MG tablet Take 81 mg by mouth at bedtime.       . cefTRIAXone 2 g in dextrose 5 % 50 mL Inject 2 g into the vein daily. X 6 weeks (first dose 5/27)      . citalopram (CELEXA) 20 MG tablet Take 1 tablet (20 mg total) by mouth daily.  30 tablet  1  . Ferrous Sulfate (IRON) 325 (65 FE) MG TABS Take 1 tablet by mouth every other day.      . furosemide (LASIX) 20 MG tablet Take  1 tablet (20 mg total) by mouth daily.  30 tablet  0  . levothyroxine (SYNTHROID, LEVOTHROID) 50 MCG tablet Take 50 mcg by mouth daily before breakfast.      . lisinopril (PRINIVIL,ZESTRIL) 10 MG tablet Take 1 tablet (10 mg total) by mouth daily.  30 tablet  1  . metoprolol tartrate (LOPRESSOR) 25 MG tablet Take 1.5 tablets (37.5 mg total) by mouth 2 (two) times daily.  90 tablet  1  . mirtazapine (REMERON) 7.5 MG tablet Take 1 tablet (7.5 mg total) by mouth at bedtime.  30 tablet  1  . omeprazole (PRILOSEC) 20 MG capsule Take 20 mg by mouth every other day.      . potassium chloride (K-DUR,KLOR-CON) 10 MEQ tablet Take 1 tablet (10 mEq total) by mouth daily.  30 tablet  1  . rifampin (RIFADIN) 300 MG capsule Take 1 capsule (300 mg total) by mouth daily. X 6 weeks  42 capsule  0  . warfarin (COUMADIN) 1 MG tablet Take 1 mg daily or as directed by [facility] starting on Sunday, 02/05/2014  60 tablet  1   No current facility-administered medications for this visit.       Physical Exam: BP 123/85  Pulse 111  Resp 16  Ht 5\' 3"  (1.6 m)  Wt 121 lb 1.6 oz (54.931 kg)  BMI 21.46 kg/m2  SpO2 98%  General appearance: alert, cooperative and no distress Heart: regular rate and rhythm Lungs: Mildly diminished in the bases Abdomen: Soft and nontender Extremities: No edema Wound: Incisions healing well without evidence of infection Wounds:  Diagnostic Studies &  Laboratory data:         Recent Radiology Findings: Dg Chest 2 View  02/13/2014   CLINICAL DATA:  Pulmonary artery thrombosis  EXAM: CHEST  2 VIEW  COMPARISON:  January 31, 2014  FINDINGS: There is underlying emphysema. There are small pleural effusions bilaterally with patchy left base atelectatic change. Elsewhere, lungs are clear. Heart is upper normal in size. The pulmonary vascularity reflects underlying emphysema. Central catheter tip is at the cavoatrial junction. No adenopathy. No pneumothorax. Patient is status post median sternotomy.  IMPRESSION: Underlying emphysema. Small pleural effusions with left base atelectasis. No pneumothorax.   Electronically Signed   By: Bretta Bang M.D.   On: 02/13/2014 15:11      Recent Labs: Lab Results  Component Value Date   WBC 15.0* 02/02/2014   HGB 11.3* 02/02/2014   HCT 33.0* 02/02/2014   PLT 502* 02/02/2014   GLUCOSE 87 02/02/2014   CHOL 162 01/09/2012   TRIG 47 01/09/2012   HDL 60 01/09/2012   LDLCALC 93 01/09/2012   ALT 28 02/02/2014   AST 43* 02/02/2014   NA 133* 02/02/2014   K 3.7 02/02/2014   CL 97 02/02/2014   CREATININE 0.69 02/02/2014   BUN 15 02/02/2014   CO2 23 02/02/2014   TSH 3.214 12/08/2013   INR 2.3 02/13/2014      Assessment / Plan:  Overall making very steady progress. He continues his intravenous antibiotic therapy. He continues Coumadin. We will see her again in 4 weeks or sooner if necessary.          Danny Frederick E 02/13/2014 3:42 PM

## 2014-02-13 NOTE — Patient Instructions (Signed)
The verbal instructions on activity progression

## 2014-02-14 DIAGNOSIS — M216X9 Other acquired deformities of unspecified foot: Secondary | ICD-10-CM | POA: Diagnosis not present

## 2014-02-14 DIAGNOSIS — Z48812 Encounter for surgical aftercare following surgery on the circulatory system: Secondary | ICD-10-CM | POA: Diagnosis not present

## 2014-02-14 DIAGNOSIS — Z452 Encounter for adjustment and management of vascular access device: Secondary | ICD-10-CM | POA: Diagnosis not present

## 2014-02-14 DIAGNOSIS — A403 Sepsis due to Streptococcus pneumoniae: Secondary | ICD-10-CM | POA: Diagnosis not present

## 2014-02-14 DIAGNOSIS — M5030 Other cervical disc degeneration, unspecified cervical region: Secondary | ICD-10-CM | POA: Diagnosis not present

## 2014-02-14 DIAGNOSIS — I10 Essential (primary) hypertension: Secondary | ICD-10-CM | POA: Diagnosis not present

## 2014-02-16 DIAGNOSIS — Z452 Encounter for adjustment and management of vascular access device: Secondary | ICD-10-CM | POA: Diagnosis not present

## 2014-02-16 DIAGNOSIS — M216X9 Other acquired deformities of unspecified foot: Secondary | ICD-10-CM | POA: Diagnosis not present

## 2014-02-16 DIAGNOSIS — I10 Essential (primary) hypertension: Secondary | ICD-10-CM | POA: Diagnosis not present

## 2014-02-16 DIAGNOSIS — A403 Sepsis due to Streptococcus pneumoniae: Secondary | ICD-10-CM | POA: Diagnosis not present

## 2014-02-16 DIAGNOSIS — Z48812 Encounter for surgical aftercare following surgery on the circulatory system: Secondary | ICD-10-CM | POA: Diagnosis not present

## 2014-02-16 DIAGNOSIS — M5030 Other cervical disc degeneration, unspecified cervical region: Secondary | ICD-10-CM | POA: Diagnosis not present

## 2014-02-17 ENCOUNTER — Ambulatory Visit (INDEPENDENT_AMBULATORY_CARE_PROVIDER_SITE_OTHER): Payer: Medicare Other | Admitting: Pharmacist

## 2014-02-17 DIAGNOSIS — I2699 Other pulmonary embolism without acute cor pulmonale: Secondary | ICD-10-CM

## 2014-02-17 DIAGNOSIS — I10 Essential (primary) hypertension: Secondary | ICD-10-CM | POA: Diagnosis not present

## 2014-02-17 DIAGNOSIS — Z452 Encounter for adjustment and management of vascular access device: Secondary | ICD-10-CM | POA: Diagnosis not present

## 2014-02-17 DIAGNOSIS — Z48812 Encounter for surgical aftercare following surgery on the circulatory system: Secondary | ICD-10-CM | POA: Diagnosis not present

## 2014-02-17 DIAGNOSIS — M5030 Other cervical disc degeneration, unspecified cervical region: Secondary | ICD-10-CM | POA: Diagnosis not present

## 2014-02-17 DIAGNOSIS — A403 Sepsis due to Streptococcus pneumoniae: Secondary | ICD-10-CM | POA: Diagnosis not present

## 2014-02-17 DIAGNOSIS — M216X9 Other acquired deformities of unspecified foot: Secondary | ICD-10-CM | POA: Diagnosis not present

## 2014-02-17 LAB — POCT INR: INR: 1.7

## 2014-02-19 DIAGNOSIS — Z452 Encounter for adjustment and management of vascular access device: Secondary | ICD-10-CM | POA: Diagnosis not present

## 2014-02-19 DIAGNOSIS — M5030 Other cervical disc degeneration, unspecified cervical region: Secondary | ICD-10-CM | POA: Diagnosis not present

## 2014-02-19 DIAGNOSIS — M216X9 Other acquired deformities of unspecified foot: Secondary | ICD-10-CM | POA: Diagnosis not present

## 2014-02-19 DIAGNOSIS — A403 Sepsis due to Streptococcus pneumoniae: Secondary | ICD-10-CM | POA: Diagnosis not present

## 2014-02-19 DIAGNOSIS — I10 Essential (primary) hypertension: Secondary | ICD-10-CM | POA: Diagnosis not present

## 2014-02-19 DIAGNOSIS — Z48812 Encounter for surgical aftercare following surgery on the circulatory system: Secondary | ICD-10-CM | POA: Diagnosis not present

## 2014-02-21 ENCOUNTER — Encounter: Payer: Self-pay | Admitting: Internal Medicine

## 2014-02-21 ENCOUNTER — Ambulatory Visit (INDEPENDENT_AMBULATORY_CARE_PROVIDER_SITE_OTHER): Payer: Medicare Other | Admitting: Pharmacist

## 2014-02-21 DIAGNOSIS — Z7901 Long term (current) use of anticoagulants: Secondary | ICD-10-CM | POA: Diagnosis not present

## 2014-02-21 DIAGNOSIS — A403 Sepsis due to Streptococcus pneumoniae: Secondary | ICD-10-CM | POA: Diagnosis not present

## 2014-02-21 DIAGNOSIS — M216X9 Other acquired deformities of unspecified foot: Secondary | ICD-10-CM | POA: Diagnosis not present

## 2014-02-21 DIAGNOSIS — M5030 Other cervical disc degeneration, unspecified cervical region: Secondary | ICD-10-CM | POA: Diagnosis not present

## 2014-02-21 DIAGNOSIS — Z452 Encounter for adjustment and management of vascular access device: Secondary | ICD-10-CM | POA: Diagnosis not present

## 2014-02-21 DIAGNOSIS — I2699 Other pulmonary embolism without acute cor pulmonale: Secondary | ICD-10-CM

## 2014-02-21 DIAGNOSIS — I10 Essential (primary) hypertension: Secondary | ICD-10-CM | POA: Diagnosis not present

## 2014-02-21 DIAGNOSIS — Z48812 Encounter for surgical aftercare following surgery on the circulatory system: Secondary | ICD-10-CM | POA: Diagnosis not present

## 2014-02-21 LAB — POCT INR: INR: 1.7

## 2014-02-21 MED ORDER — WARFARIN SODIUM 1 MG PO TABS: ORAL_TABLET | ORAL | Status: DC

## 2014-02-23 ENCOUNTER — Telehealth: Payer: Self-pay | Admitting: Interventional Cardiology

## 2014-02-23 DIAGNOSIS — I10 Essential (primary) hypertension: Secondary | ICD-10-CM | POA: Diagnosis not present

## 2014-02-23 DIAGNOSIS — M216X9 Other acquired deformities of unspecified foot: Secondary | ICD-10-CM | POA: Diagnosis not present

## 2014-02-23 DIAGNOSIS — Z452 Encounter for adjustment and management of vascular access device: Secondary | ICD-10-CM | POA: Diagnosis not present

## 2014-02-23 DIAGNOSIS — A403 Sepsis due to Streptococcus pneumoniae: Secondary | ICD-10-CM | POA: Diagnosis not present

## 2014-02-23 DIAGNOSIS — M5030 Other cervical disc degeneration, unspecified cervical region: Secondary | ICD-10-CM | POA: Diagnosis not present

## 2014-02-23 DIAGNOSIS — Z48812 Encounter for surgical aftercare following surgery on the circulatory system: Secondary | ICD-10-CM | POA: Diagnosis not present

## 2014-02-23 NOTE — Telephone Encounter (Signed)
[facility] called to report a critical lab pt NA [phone]. And are faxing copy of labs over.this is  Dr.Crenshaws's pt.will route to his nurse Luisa Dago, RN

## 2014-02-23 NOTE — Telephone Encounter (Signed)
Labs were ordered by and forwarded to dr Roxy Manns

## 2014-02-23 NOTE — Telephone Encounter (Signed)
Routed to Dr. Crenshaw 

## 2014-02-23 NOTE — Telephone Encounter (Signed)
I have not seen this pt since 7/13. It appears he had recent surgery for aneurysm. Please make sure lab is forwarded to ordering physician. Home health should not be sending orders to [facility] heartcare that I can tell. If she needs to be seen from a cardiac standpoint, please arrange. Kirk Ruths

## 2014-02-24 ENCOUNTER — Telehealth: Payer: Self-pay | Admitting: Licensed Clinical Social Worker

## 2014-02-24 ENCOUNTER — Ambulatory Visit (INDEPENDENT_AMBULATORY_CARE_PROVIDER_SITE_OTHER): Payer: Medicare Other | Admitting: Cardiology

## 2014-02-24 DIAGNOSIS — M5030 Other cervical disc degeneration, unspecified cervical region: Secondary | ICD-10-CM | POA: Diagnosis not present

## 2014-02-24 DIAGNOSIS — A403 Sepsis due to Streptococcus pneumoniae: Secondary | ICD-10-CM | POA: Diagnosis not present

## 2014-02-24 DIAGNOSIS — Z48812 Encounter for surgical aftercare following surgery on the circulatory system: Secondary | ICD-10-CM | POA: Diagnosis not present

## 2014-02-24 DIAGNOSIS — M216X9 Other acquired deformities of unspecified foot: Secondary | ICD-10-CM | POA: Diagnosis not present

## 2014-02-24 DIAGNOSIS — Z452 Encounter for adjustment and management of vascular access device: Secondary | ICD-10-CM | POA: Diagnosis not present

## 2014-02-24 DIAGNOSIS — I10 Essential (primary) hypertension: Secondary | ICD-10-CM | POA: Diagnosis not present

## 2014-02-24 DIAGNOSIS — I2699 Other pulmonary embolism without acute cor pulmonale: Secondary | ICD-10-CM

## 2014-02-24 LAB — POCT INR: INR: 2.3

## 2014-02-24 NOTE — Telephone Encounter (Signed)
RN called from [place] stating that the patient is complaining of pressure in both arms during infusion of antibiotics for the past 2 days. Patient states that it feels like a tight blood pressure cuff on both arms during infusion only. Please advise

## 2014-02-27 ENCOUNTER — Encounter: Payer: Self-pay | Admitting: Family Medicine

## 2014-02-27 ENCOUNTER — Encounter: Payer: Self-pay | Admitting: Cardiology

## 2014-02-27 ENCOUNTER — Telehealth: Payer: Self-pay | Admitting: *Deleted

## 2014-02-27 DIAGNOSIS — A403 Sepsis due to Streptococcus pneumoniae: Secondary | ICD-10-CM | POA: Diagnosis not present

## 2014-02-27 DIAGNOSIS — Z452 Encounter for adjustment and management of vascular access device: Secondary | ICD-10-CM | POA: Diagnosis not present

## 2014-02-27 DIAGNOSIS — M216X9 Other acquired deformities of unspecified foot: Secondary | ICD-10-CM | POA: Diagnosis not present

## 2014-02-27 DIAGNOSIS — I10 Essential (primary) hypertension: Secondary | ICD-10-CM | POA: Diagnosis not present

## 2014-02-27 DIAGNOSIS — Z48812 Encounter for surgical aftercare following surgery on the circulatory system: Secondary | ICD-10-CM | POA: Diagnosis not present

## 2014-02-27 DIAGNOSIS — Z5181 Encounter for therapeutic drug level monitoring: Secondary | ICD-10-CM | POA: Diagnosis not present

## 2014-02-27 DIAGNOSIS — M5030 Other cervical disc degeneration, unspecified cervical region: Secondary | ICD-10-CM | POA: Diagnosis not present

## 2014-02-27 DIAGNOSIS — Z7901 Long term (current) use of anticoagulants: Secondary | ICD-10-CM | POA: Diagnosis not present

## 2014-02-27 NOTE — Telephone Encounter (Signed)
Received call from Dr. Vivi Martens office stating that the patient's PICC was clogged and/or infected.  RN advised Thurmond Butts to have [place] page the physician Dr. Baxter Flattery for advice regarding changing the PICC or changing antibiotics. Landis Gandy, RN

## 2014-02-28 DIAGNOSIS — M5030 Other cervical disc degeneration, unspecified cervical region: Secondary | ICD-10-CM | POA: Diagnosis not present

## 2014-02-28 DIAGNOSIS — M216X9 Other acquired deformities of unspecified foot: Secondary | ICD-10-CM | POA: Diagnosis not present

## 2014-02-28 DIAGNOSIS — A403 Sepsis due to Streptococcus pneumoniae: Secondary | ICD-10-CM | POA: Diagnosis not present

## 2014-02-28 DIAGNOSIS — Z48812 Encounter for surgical aftercare following surgery on the circulatory system: Secondary | ICD-10-CM | POA: Diagnosis not present

## 2014-02-28 DIAGNOSIS — I10 Essential (primary) hypertension: Secondary | ICD-10-CM | POA: Diagnosis not present

## 2014-02-28 DIAGNOSIS — Z452 Encounter for adjustment and management of vascular access device: Secondary | ICD-10-CM | POA: Diagnosis not present

## 2014-03-01 ENCOUNTER — Ambulatory Visit (INDEPENDENT_AMBULATORY_CARE_PROVIDER_SITE_OTHER): Payer: Medicare Other | Admitting: Physician Assistant

## 2014-03-01 ENCOUNTER — Encounter: Payer: Self-pay | Admitting: Physician Assistant

## 2014-03-01 ENCOUNTER — Ambulatory Visit (INDEPENDENT_AMBULATORY_CARE_PROVIDER_SITE_OTHER): Payer: Medicare Other | Admitting: Internal Medicine

## 2014-03-01 ENCOUNTER — Other Ambulatory Visit: Payer: Self-pay | Admitting: Thoracic Surgery (Cardiothoracic Vascular Surgery)

## 2014-03-01 ENCOUNTER — Encounter: Payer: Self-pay | Admitting: Internal Medicine

## 2014-03-01 VITALS — BP 112/70 | HR 123 | Ht 63.0 in | Wt 116.0 lb

## 2014-03-01 VITALS — BP 141/95 | HR 121 | Temp 97.8°F | Wt 117.0 lb

## 2014-03-01 DIAGNOSIS — Z452 Encounter for adjustment and management of vascular access device: Secondary | ICD-10-CM | POA: Diagnosis not present

## 2014-03-01 DIAGNOSIS — I2699 Other pulmonary embolism without acute cor pulmonale: Secondary | ICD-10-CM

## 2014-03-01 DIAGNOSIS — R000 Tachycardia, unspecified: Secondary | ICD-10-CM | POA: Diagnosis not present

## 2014-03-01 DIAGNOSIS — M216X9 Other acquired deformities of unspecified foot: Secondary | ICD-10-CM | POA: Diagnosis not present

## 2014-03-01 DIAGNOSIS — I711 Thoracic aortic aneurysm, ruptured, unspecified: Secondary | ICD-10-CM | POA: Diagnosis not present

## 2014-03-01 DIAGNOSIS — R7881 Bacteremia: Secondary | ICD-10-CM | POA: Diagnosis not present

## 2014-03-01 DIAGNOSIS — M5030 Other cervical disc degeneration, unspecified cervical region: Secondary | ICD-10-CM | POA: Diagnosis not present

## 2014-03-01 DIAGNOSIS — I6350 Cerebral infarction due to unspecified occlusion or stenosis of unspecified cerebral artery: Secondary | ICD-10-CM | POA: Diagnosis not present

## 2014-03-01 DIAGNOSIS — A491 Streptococcal infection, unspecified site: Secondary | ICD-10-CM

## 2014-03-01 DIAGNOSIS — I10 Essential (primary) hypertension: Secondary | ICD-10-CM | POA: Diagnosis not present

## 2014-03-01 DIAGNOSIS — B955 Unspecified streptococcus as the cause of diseases classified elsewhere: Secondary | ICD-10-CM

## 2014-03-01 DIAGNOSIS — R110 Nausea: Secondary | ICD-10-CM | POA: Diagnosis not present

## 2014-03-01 DIAGNOSIS — Z48812 Encounter for surgical aftercare following surgery on the circulatory system: Secondary | ICD-10-CM | POA: Diagnosis not present

## 2014-03-01 DIAGNOSIS — I519 Heart disease, unspecified: Secondary | ICD-10-CM

## 2014-03-01 DIAGNOSIS — A403 Sepsis due to Streptococcus pneumoniae: Secondary | ICD-10-CM | POA: Diagnosis not present

## 2014-03-01 LAB — POCT INR: INR: 2.1

## 2014-03-01 MED ORDER — AMOXICILLIN 500 MG PO TABS: 500.0000 mg | ORAL_TABLET | Freq: Two times a day (BID) | ORAL | Status: DC

## 2014-03-01 MED ORDER — ONDANSETRON HCL 4 MG PO TABS: 4.0000 mg | ORAL_TABLET | Freq: Three times a day (TID) | ORAL | Status: DC | PRN

## 2014-03-01 MED ORDER — LISINOPRIL 5 MG PO TABS: 5.0000 mg | ORAL_TABLET | Freq: Every day | ORAL | Status: DC

## 2014-03-01 MED ORDER — METOPROLOL TARTRATE 50 MG PO TABS: 50.0000 mg | ORAL_TABLET | Freq: Two times a day (BID) | ORAL | Status: DC

## 2014-03-01 NOTE — Progress Notes (Signed)
Subjective:    Patient ID: Danny Frederick, male    DOB: August 12, 1934, 80 y.o.   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 He underwent thoracic ascending aneurysm with graft repair. He had discharged on 4-6 wk of IV ceftriaxone plus rifampin. He noticed that on 6/27 that towards the end of the ceftriaxone infusion he had bilateral arm numbness that disipated after 1 hr. It recurred on 6/28 and 6/29 lasting up to 2hr. Always having association with the antibiotic infusion. I was called by home health RN during last infusion and asked her to stop and pull picc line. He reports doing well, still having slow gains in gaining weight but having improved appetite. He reports he has abdominal discomfort ? Nausea with rifampin dispite taking on full stomach   Current Outpatient Prescriptions on File Prior to Visit  Medication Sig Dispense Refill  . aspirin 81 MG tablet Take 81 mg by mouth at bedtime.       . citalopram (CELEXA) 20 MG tablet Take 1 tablet (20 mg total) by mouth daily.  30 tablet  1  . Ferrous Sulfate (IRON) 325 (65 FE) MG TABS Take 1 tablet by mouth every other day.      . furosemide (LASIX) 20 MG tablet Take 1 tablet (20 mg total) by mouth daily.  30 tablet  0  . levothyroxine (SYNTHROID, LEVOTHROID) 50 MCG tablet Take 50 mcg by mouth daily before breakfast.      . lisinopril (PRINIVIL,ZESTRIL) 10 MG tablet Take 1 tablet (10 mg total) by mouth daily.  30 tablet  1  . metoprolol tartrate (LOPRESSOR) 25 MG tablet Take 1.5 tablets (37.5 mg total) by mouth 2 (two) times daily.  90 tablet  1  . mirtazapine (REMERON) 7.5 MG tablet Take 1 tablet (7.5 mg total) by mouth at bedtime.  30 tablet  1  . omeprazole (PRILOSEC) 20 MG capsule Take 20 mg by mouth every other day.      . potassium chloride (K-DUR,KLOR-CON) 10 MEQ tablet Take 1  tablet (10 mEq total) by mouth daily.  30 tablet  1  . rifampin (RIFADIN) 300 MG capsule Take 1 capsule (300 mg total) by mouth daily. X 6 weeks  42 capsule  0  . warfarin (COUMADIN) 1 MG tablet Take as directed by [facility]  150 tablet  1   No current facility-administered medications on file prior to visit.   Active Ambulatory Problems    Diagnosis Date Noted  . IRON DEFICIENCY ANEMIA SECONDARY TO BLOOD LOSS 11/08/2008  . ANEMIA 05/26/2008  . HYPERTENSION 03/02/2008  . DYSPNEA 03/02/2008  . Unspecified hypothyroidism 01/22/2011  . Essential hypertension, benign 01/22/2011  . GERD (gastroesophageal reflux disease) 01/22/2011  . Iron deficiency anemia, unspecified 01/22/2011  . Syncope 01/22/2012  . Allergic conjunctivitis 10/20/2012  . Solitary pulmonary nodule 12/08/2013  . Ruptured thoracic aortic aneurysm 01/23/2014  . 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 Right ventricular dysfunction 01/23/2014   Resolved Ambulatory Problems    Diagnosis Date Noted  . Internal hemorrhoids 09/23/2012  . Pulmonary embolus 01/23/2014   Past Medical History  Diagnosis Date  . Hypertension   . Mitral valve prolapse   . Foot drop, right   . BPH (benign prostatic hypertrophy)   . Hearing loss in left ear   . Diverticulosis of colon 12/09  .  Allergic rhinitis, cause unspecified       Review of Systems     Objective:   Physical Exam BP 141/95  Pulse 121  Temp(Src) 97.8 F (36.6 C) (Oral)  Wt 117 lb (53.071 kg) Physical Exam  Constitutional: He is oriented to person, place, and time. He appears well-developed and well-nourished. No distress.  HENT:  Mouth/Throat: Oropharynx is clear and moist. No oropharyngeal exudate.  Cardiovascular: Normal rate, regular rhythm and normal heart sounds. Exam reveals no gallop and no friction rub.  No murmur heard.  Pulmonary/Chest: Effort normal and breath sounds normal. No respiratory distress. He has no wheezes.  Abdominal: Soft.  Bowel sounds are normal. He exhibits no distension. There is no tenderness.  Lymphadenopathy:  He has no cervical adenopathy.  Neurological: He is alert and oriented to person, place, and time.  Skin: surgical incision is well healing including right shoulder incision Psychiatric: He has a normal mood and affect. His behavior is normal.        Assessment & Plan:  Streptococcal pneumonaie aortitis = since vascular graft was placed in setting of infection, there was concern that he would seed his graft. Recommend to switch to oral antibiotics for addn 2-4 wk to complete 8 wk of therapy. Will switch to amoxicillin 500mg  TID plus rifampin  Antibiotic reaction = paroxysmal bilateral arm numbness  Appears to be have been associated with the timing of his last 2-3 doses of antibiotics which have finished his Iv course of therapy. Unusual to be associated with antibiotics. Recommended that he seek immediate evaluation if they recur since it could represent neuro insult/cva. If they recur, he will probably need mri of spine and brain.

## 2014-03-01 NOTE — Patient Instructions (Addendum)
Your physician has requested that you have an echocardiogram. Echocardiography is a painless test that uses sound waves to create images of your heart. It provides your doctor with information about the size and shape of your heart and how well your heart's chambers and valves are working. This procedure takes approximately one hour. There are no restrictions for this procedure.  Your physician has recommended you make the following change in your medication:  1. DECREASE LISINOPRIL TO 5 MG DAILY; NEW RX HAS BEEN SENT IN TODAY TO [facility] 2. INCREASE METOPROLOL TO 50 MG TWICE DAILY; NEW RX SENT IN TODAY TO [facility]  Your physician recommends that you schedule a follow-up appointment in: 2-3 [place], [facility]

## 2014-03-01 NOTE — Progress Notes (Signed)
Cardiology Office Note    Date:  03/01/2014   ID:  Danny Frederick, DOB 04-Jan-1934, MRN [id number]  PCP:  Danny Ports, MD  Cardiologist:  Danny Frederick      History of Present Illness: Danny Frederick is a 80 y.o. male with a hx of probably micturition syncope, HTN, hypothyroidism.  Last seen by Danny Frederick in 2013.    He was recently admitted 5/25-6/6 with a contained, ruptured ascending thoracic aortic aneurysm in the setting of streptococcal pneumoniae bacteremia/aortitis.  He underwent repair of the ascending thoracic aortic aneurysm with a 32 mm Hemashield straight graft repair. He was also followed by infectious disease and treated with antibiotics. The patient was noted to have clot in the main pulmonary artery. This was likely related to compression of the pulmonary artery by the false aneurysm. He was started on Coumadin.  He did have some issues with depression post op and was seen by psychiatry.  He remained in NSR post op.  He returns for follow up.    The patient is feeling well. He has no complaints. He denies chest pain, shortness of breath, syncope, orthopnea, PND or edema. He is now on oral antibiotics. He denies palpitations.   Studies:  - Echocardiogram (12/2011):  Normal LV function, grade 1 diastolic dysfunction and mild right atrial enlargement.   - CardioNet monitor (12/2011):  sinus rhythm PAC and PVC   Recent Labs: 12/08/2013: TSH 3.214  02/02/2014: ALT 28; Creatinine 0.69; Hemoglobin 11.3*; Potassium 3.7; Pro B Natriuretic peptide (BNP) 3712.0*   Wt Readings from Last 3 Encounters:  03/01/14 116 lb (52.617 kg)  03/01/14 117 lb (53.071 kg)  02/13/14 121 lb 1.6 oz (54.931 kg)     Past Medical History  Diagnosis Date  . Hypertension   . Unspecified hypothyroidism   . Mitral valve prolapse     h/o; normal echo 12/2011 with no MVP seen  . Foot drop, right   . BPH (benign prostatic hypertrophy)   . Iron deficiency anemia, unspecified 6/09  . Hearing  loss in left ear   . Diverticulosis of colon 12/09  . Internal hemorrhoids   . GERD (gastroesophageal reflux disease)   . Allergic rhinitis, cause unspecified     on allergy shots (Dr. Orvil Feil)  . Allergic conjunctivitis   . Ruptured thoracic aortic aneurysm 01/23/2014  . 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 Right ventricular dysfunction 01/23/2014    Secondary to obstruction of main pulmonary artery    Current Outpatient Prescriptions  Medication Sig Dispense Refill  . amoxicillin (AMOXIL) 500 MG tablet Take 1 tablet (500 mg total) by mouth 2 (two) times daily.  90 tablet  3  . aspirin 81 MG tablet Take 81 mg by mouth at bedtime.       . citalopram (CELEXA) 20 MG tablet Take 1 tablet (20 mg total) by mouth daily.  30 tablet  1  . Ferrous Sulfate (IRON) 325 (65 FE) MG TABS Take 1 tablet by mouth every other day.      . furosemide (LASIX) 20 MG tablet Take 1 tablet (20 mg total) by mouth daily.  30 tablet  0  . levothyroxine (SYNTHROID, LEVOTHROID) 50 MCG tablet Take 50 mcg by mouth daily before breakfast.      . lisinopril (PRINIVIL,ZESTRIL) 10 MG tablet Take 1 tablet (10 mg total) by mouth daily.  30 tablet  1  . metoprolol tartrate (LOPRESSOR) 25 MG tablet Take 1.5 tablets (37.5 mg total) by mouth 2 (two)  times daily.  90 tablet  1  . mirtazapine (REMERON) 7.5 MG tablet Take 1 tablet (7.5 mg total) by mouth at bedtime.  30 tablet  1  . omeprazole (PRILOSEC) 20 MG capsule Take 20 mg by mouth every other day.      . ondansetron (ZOFRAN) 4 MG tablet Take 1 tablet (4 mg total) by mouth every 8 (eight) hours as needed for nausea or vomiting.  20 tablet  0  . potassium chloride (K-DUR,KLOR-CON) 10 MEQ tablet Take 1 tablet (10 mEq total) by mouth daily.  30 tablet  1  . rifampin (RIFADIN) 300 MG capsule Take 1 capsule (300 mg total) by mouth daily. X 6 weeks  42 capsule  0  . warfarin (COUMADIN) 1 MG tablet Take as directed by [facility]  150 tablet  1   No current  facility-administered medications for this visit.    Allergies:   Review of patient's allergies indicates no known allergies.   Social History:  The patient  reports that he quit smoking about 36 years ago. His smoking use included Cigarettes. He smoked 0.00 packs per day. He has never used smokeless tobacco. He reports that he drinks alcohol. He reports that he does not use illicit drugs.   Family History:  The patient's family history includes Cancer in his brother and brother; Diabetes in his mother and sister; Emphysema in his brother; Heart disease in his mother; Parkinsonism in his father; Stroke in his father. There is no history of Colon cancer.   ROS:  Please see the history of present illness.      All other systems reviewed and negative.   PHYSICAL EXAM: VS:  BP 112/70  Pulse 123  Ht 5\' 3"  (1.6 m)  Wt 116 lb (52.617 kg)  BMI 20.55 kg/m2 Well nourished, well developed, in no acute distress HEENT: normal Neck: no JVD Cardiac:  normal S1, S2; RRR; no murmurno rub Lungs:  clear to auscultation bilaterally, no wheezing, rhonchi or rales Abd: soft, nontender, no hepatomegaly Ext: no edema Skin: warm and dry Neuro:  CNs 2-12 intact, no focal abnormalities noted  EKG:  Sinus tachycardia, HR 123, inferolateral T wave inversions     ASSESSMENT AND PLAN:  1. Tachycardia:  I reviewed his ECG today with Dr. Rayann Frederick. We believe this is sinus tachycardia. He may have an atypical atrial tachycardia. In any event, he is asymptomatic. He does have widespread T-wave inversions on ECG. I will decrease his lisinopril 5 mg daily. I will increase his metoprolol to 50 mg twice a day. I will arrange an echocardiogram to rule out pericardial effusion or wall motion abnormalities. I will check a basic metabolic panel and CBC. I will see him back in 2-3 weeks. 2. Ruptured thoracic aortic aneurysm status post repair: Followup with Dr. Roxy Frederick as planned. 3. Pulmonary artery thrombosis:  Continue coumadin  for now.  4. Essential hypertension, benign:  Controlled. 5. Disposition:  F/u with me or Danny Frederick in 2-3 weeks.    Signed, Danny Frederick, MHS 03/01/2014 2:26 PM    [facility] [place], [place], [place]  [address] Phone: [phone]; Fax: [phone]

## 2014-03-02 ENCOUNTER — Encounter: Payer: Self-pay | Admitting: Physician Assistant

## 2014-03-02 ENCOUNTER — Other Ambulatory Visit: Payer: Medicare Other

## 2014-03-02 ENCOUNTER — Telehealth: Payer: Self-pay | Admitting: *Deleted

## 2014-03-02 ENCOUNTER — Ambulatory Visit (HOSPITAL_COMMUNITY): Payer: Medicare Other | Attending: Physician Assistant | Admitting: Radiology

## 2014-03-02 ENCOUNTER — Inpatient Hospital Stay: Payer: Self-pay | Admitting: Internal Medicine

## 2014-03-02 DIAGNOSIS — I10 Essential (primary) hypertension: Secondary | ICD-10-CM | POA: Insufficient documentation

## 2014-03-02 DIAGNOSIS — I711 Thoracic aortic aneurysm, ruptured, unspecified: Secondary | ICD-10-CM | POA: Diagnosis not present

## 2014-03-02 DIAGNOSIS — R000 Tachycardia, unspecified: Secondary | ICD-10-CM

## 2014-03-02 DIAGNOSIS — I059 Rheumatic mitral valve disease, unspecified: Secondary | ICD-10-CM | POA: Insufficient documentation

## 2014-03-02 DIAGNOSIS — Z87891 Personal history of nicotine dependence: Secondary | ICD-10-CM | POA: Diagnosis not present

## 2014-03-02 DIAGNOSIS — I079 Rheumatic tricuspid valve disease, unspecified: Secondary | ICD-10-CM | POA: Insufficient documentation

## 2014-03-02 DIAGNOSIS — I2699 Other pulmonary embolism without acute cor pulmonale: Secondary | ICD-10-CM | POA: Diagnosis not present

## 2014-03-02 DIAGNOSIS — I319 Disease of pericardium, unspecified: Secondary | ICD-10-CM | POA: Diagnosis not present

## 2014-03-02 NOTE — Progress Notes (Signed)
Echocardiogram performed.  

## 2014-03-02 NOTE — Telephone Encounter (Signed)
lmptcb for echo results 

## 2014-03-03 ENCOUNTER — Other Ambulatory Visit: Payer: Self-pay | Admitting: Physician Assistant

## 2014-03-03 DIAGNOSIS — A403 Sepsis due to Streptococcus pneumoniae: Secondary | ICD-10-CM | POA: Diagnosis not present

## 2014-03-03 DIAGNOSIS — M5030 Other cervical disc degeneration, unspecified cervical region: Secondary | ICD-10-CM | POA: Diagnosis not present

## 2014-03-03 DIAGNOSIS — I10 Essential (primary) hypertension: Secondary | ICD-10-CM | POA: Diagnosis not present

## 2014-03-03 DIAGNOSIS — Z48812 Encounter for surgical aftercare following surgery on the circulatory system: Secondary | ICD-10-CM | POA: Diagnosis not present

## 2014-03-03 DIAGNOSIS — Z452 Encounter for adjustment and management of vascular access device: Secondary | ICD-10-CM | POA: Diagnosis not present

## 2014-03-03 DIAGNOSIS — M216X9 Other acquired deformities of unspecified foot: Secondary | ICD-10-CM | POA: Diagnosis not present

## 2014-03-06 ENCOUNTER — Ambulatory Visit (INDEPENDENT_AMBULATORY_CARE_PROVIDER_SITE_OTHER): Payer: Medicare Other

## 2014-03-06 ENCOUNTER — Ambulatory Visit (INDEPENDENT_AMBULATORY_CARE_PROVIDER_SITE_OTHER): Payer: Self-pay | Admitting: Thoracic Surgery (Cardiothoracic Vascular Surgery)

## 2014-03-06 ENCOUNTER — Ambulatory Visit
Admission: RE | Admit: 2014-03-06 | Discharge: 2014-03-06 | Disposition: A | Discharge status: Home / Self Care | Payer: Medicare Other | Source: Ambulatory Visit | Attending: Thoracic Surgery (Cardiothoracic Vascular Surgery) | Admitting: Thoracic Surgery (Cardiothoracic Vascular Surgery)

## 2014-03-06 VITALS — BP 115/76 | HR 86 | Resp 16 | Ht 63.0 in | Wt 115.0 lb

## 2014-03-06 DIAGNOSIS — I2699 Other pulmonary embolism without acute cor pulmonale: Secondary | ICD-10-CM

## 2014-03-06 DIAGNOSIS — I711 Thoracic aortic aneurysm, ruptured, unspecified: Secondary | ICD-10-CM

## 2014-03-06 DIAGNOSIS — Z09 Encounter for follow-up examination after completed treatment for conditions other than malignant neoplasm: Secondary | ICD-10-CM

## 2014-03-06 DIAGNOSIS — J90 Pleural effusion, not elsewhere classified: Secondary | ICD-10-CM | POA: Diagnosis not present

## 2014-03-06 DIAGNOSIS — Z48812 Encounter for surgical aftercare following surgery on the circulatory system: Secondary | ICD-10-CM

## 2014-03-06 DIAGNOSIS — I519 Heart disease, unspecified: Secondary | ICD-10-CM

## 2014-03-06 LAB — POCT INR: INR: 2.1

## 2014-03-06 NOTE — Patient Instructions (Signed)
The patient may return to driving an automobile as long as they are no longer requiring oral narcotic pain relievers during the daytime.  It would be wise to start driving only short distances during the daylight and gradually increase from there as they feel comfortable.  The patient should continue to avoid any heavy lifting or strenuous use of arms or shoulders for at least a total of three months from the time of surgery.  The patient is encouraged to enroll and participate in the outpatient cardiac rehab program beginning as soon as practical.

## 2014-03-06 NOTE — Telephone Encounter (Signed)
lmptcb x 2 for echo results

## 2014-03-06 NOTE — Telephone Encounter (Signed)
lmom on daughter's phone # for results since I have lmptcb x 3 on pt's phone for results.

## 2014-03-06 NOTE — Progress Notes (Signed)
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  The patient presented with smouldering symptoms consistent with bacterioal endocarditis, and cultures obtained intraoperatively grew STREPTOCOCCUS PNEUMONIAE.  He was seen in consultation post-operatively by the Infectious Disease team and has been treated with intravenous ceftriaxone.  His postoperative convalescence has been remarkably uncomplicated with exception of acute exacerbation of major depression. He has been anticoagulated with Coumadin because of the presence of thrombus within the main pulmonary artery noted at the time of presentation that appeared to be related to direct compression and involvement of the false aneurysm was pulmonary artery itself. He was last seen in our office On 02/13/2014. Since then he has been seen in followup by Carlyle Basques at the Infectious Disease clinic and Richardson Dopp at [facility].  At the time the patient had been having problems with his PICC line which was subsequently removed. He is now being treated with oral amoxicillin and rifampin. He returns to the office today for followup with his daughter. He has made remarkable progress. He now states that he feels like his usual self. She still has a marginal appetite and his strength is nowhere near back normal, but overall he has made tremendous progress. He is no longer depressed. He has no pain in his chest. He has no shortness of breath. He is walking regularly without any limitation. He has not had any problems with Coumadin therapy.    Current Outpatient Prescriptions  Medication Sig Dispense Refill  . amoxicillin (AMOXIL) 500  MG tablet Take 1 tablet (500 mg total) by mouth 2 (two) times daily.  90 tablet  3  . aspirin 81 MG tablet Take 81 mg by mouth at bedtime.       . citalopram (CELEXA) 20 MG tablet Take 1 tablet (20 mg total) by mouth daily.  30 tablet  1  . Ferrous Sulfate (IRON) 325 (65 FE) MG TABS Take 1 tablet by mouth every other day.      . furosemide (LASIX) 20 MG tablet Take 1 tablet (20 mg total) by mouth daily.  30 tablet  0  . levothyroxine (SYNTHROID, LEVOTHROID) 50 MCG tablet Take 50 mcg by mouth daily before breakfast.      . lisinopril (PRINIVIL,ZESTRIL) 5 MG tablet Take 1 tablet (5 mg total) by mouth daily.  30 tablet  11  . metoprolol tartrate (LOPRESSOR) 50 MG tablet Take 1 tablet (50 mg total) by mouth 2 (two) times daily.  60 tablet  11  . mirtazapine (REMERON) 7.5 MG tablet Take 1 tablet (7.5 mg total) by mouth at bedtime.  30 tablet  1  . omeprazole (PRILOSEC) 20 MG capsule Take 20 mg by mouth every other day.      . ondansetron (ZOFRAN) 4 MG tablet Take 1 tablet (4 mg total) by mouth every 8 (eight) hours as needed for nausea or vomiting.  20 tablet  0  . potassium chloride (K-DUR,KLOR-CON) 10 MEQ tablet Take 1 tablet (10 mEq total) by mouth daily.  30 tablet  1  . rifampin (RIFADIN) 300 MG capsule Take  1 capsule (300 mg total) by mouth daily. X 6 weeks  42 capsule  0  . warfarin (COUMADIN) 1 MG tablet Take as directed by Coumadin Clinic  150 tablet  1   No current facility-administered medications for this visit.      Physical Exam:   There were no vitals taken for this visit.  General:  Well-appearing  Chest:   Clear  CV:   Regular rate and rhythm without murmur  Incisions:  Healing nicely, sternum is stable  Abdomen:  Soft and nontender  Extremities:  Warm and well-perfused  Diagnostic Tests:  Transthoracic Echocardiography  Patient: Rondarius, Kadrmas MR #: [id number] Study Date: 03/02/2014 Gender: M Age: 80 Height: 160 cm Weight: 52.6 kg BSA: 1.53 m^2 Pt.  Status: Room:  SONOGRAPHER Victorio Palm, RDCS ATTENDING Richardson Dopp T Faylene Million, Scott T REFERRING Richardson Dopp T PERFORMING [facility], Outpatient  cc:  ------------------------------------------------------------------- LV EF: 50% - 55%  ------------------------------------------------------------------- Indications: Ruptured thoracic aortic anurysm. (441.1). 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 Pericardial effusion 423.9.  ------------------------------------------------------------------- History: PMH: Evaluate for pericardial effusion. Patient has a history of right ventricular dysfunction secondary to blocked pulmonary artery. History of a contained ruptured ascending aortic aneurysm in the setting of pneumoniae/aortitis. Clot in main pulmonary artery likely related to compression of PA by false aneurysm. Acquired from the patient and from the patient&'s chart. Tachycardia. Risk factors: Former tobacco use. Hypertension.  ------------------------------------------------------------------- Study Conclusions  - Left ventricle: The cavity size was normal. Wall thickness was increased in a pattern of mild LVH. Systolic function was normal. The estimated ejection fraction was in the range of 50% to 55%. Wall motion was normal; there were no regional wall motion abnormalities. Doppler parameters are consistent with abnormal left ventricular relaxation (grade 1 diastolic dysfunction). - Ventricular septum: Septal motion showed paradox. - Mitral valve: There was mild regurgitation. - Right ventricle: The cavity size was mildly dilated. Systolic function was mildly reduced.  Impressions:  - Normal LV function; paradoxical septal motion; no pericardial effusion; normal size aortic root.  ------------------------------------------------------------------- Labs, prior tests, procedures, and surgery: Echocardiography (2013). EF was 65%.  Peripheral vascular repair  (May 2015). Aortic root repair. Status post ascending thoracic aneurysm repaine with 32 mm Hemashield straight graft repair. Transthoracic echocardiography. M-mode, complete 2D, spectral Doppler, and color Doppler. Birthdate: Patient birthdate: 01/15/1934. Age: Patient is 80 yr old. Sex: Gender: male. Height: Height: 160 cm. Height: 63 in. Weight: Weight: 52.6 kg. Weight: 115.8 lb. Body mass index: BMI: 20.5 kg/m^2. Body surface area: BSA: 1.53 m^2. Blood pressure: 120/75 Patient status: Outpatient. Study date: Study date: 03/02/2014. Study time: 02:11 PM. Location: [facility]  -------------------------------------------------------------------  ------------------------------------------------------------------- Left ventricle: The cavity size was normal. Wall thickness was increased in a pattern of mild LVH. Systolic function was normal. The estimated ejection fraction was in the range of 50% to 55%. Wall motion was normal; there were no regional wall motion abnormalities. Doppler parameters are consistent with abnormal left ventricular relaxation (grade 1 diastolic dysfunction).  ------------------------------------------------------------------- Aortic valve: Trileaflet; mildly calcified leaflets. Mobility was not restricted. Doppler: Transvalvular velocity was within the normal range. There was no stenosis. There was no regurgitation.  ------------------------------------------------------------------- Aorta: Aortic root: The aortic root was normal in size.  ------------------------------------------------------------------- Mitral valve: Structurally normal valve. Mobility was not restricted. Doppler: Transvalvular velocity was within the normal range. There was no evidence for stenosis. There was mild regurgitation.  ------------------------------------------------------------------- Left atrium: The atrium was normal in  size.  ------------------------------------------------------------------- Right ventricle: The cavity  size was mildly dilated. Systolic function was mildly reduced.  ------------------------------------------------------------------- Ventricular septum: Septal motion showed paradox.  ------------------------------------------------------------------- Pulmonic valve: Doppler: Transvalvular velocity was within the normal range. There was no evidence for stenosis.  ------------------------------------------------------------------- Tricuspid valve: Structurally normal valve. Doppler: Transvalvular velocity was within the normal range. There was trivial regurgitation.  ------------------------------------------------------------------- Right atrium: The atrium was normal in size.  ------------------------------------------------------------------- Pericardium: There was no pericardial effusion.  ------------------------------------------------------------------- Systemic veins: Inferior vena cava: The vessel was normal in size.  ------------------------------------------------------------------- Prepared and Electronically Authenticated by  Kirk Ruths 2015-07-02T17:02:05  ------------------------------------------------------------------- Measurements  Left ventricle Value 01/27/2012 Reference LV ID, ED, PLAX chordal (L) 38.4 mm 41.3 43 - 52 LV ID, ES, PLAX chordal (N) 28.7 mm 28.3 23 - 38 LV fx shortening, PLAX (L) 25 % 31 >=29 chordal LV PW thickness, ED 10.2 mm 9.46 --------- IVS/LV PW ratio, ED (N) 1.21 1.06 <=1.3 Stroke volume, 2D 55 ml ---------- --------- Stroke volume/bsa, 2D 36 ml/m^2 ---------- --------- LV e&', lateral 9.43 cm/s 7.99 --------- LV E/e&', lateral 7.01 6.8 --------- LV e&', medial 4.83 cm/s 4.78 --------- LV E/e&', medial 13.69 11.36 --------- LV e&', average 7.13 cm/s ---------- --------- LV E/e&', average 9.27 ----------  ---------  Ventricular septum Value 01/27/2012 Reference IVS thickness, ED 12.3 mm 10 ---------  LVOT Value 01/27/2012 Reference LVOT ID, S 24 mm 21 --------- LVOT area 4.52 cm^2 3.46 --------- LVOT ID 24 mm 21 --------- LVOT VTI, S 12.1 cm 16.1 --------- Stroke volume (SV), LVOT 54.7 ml 55.8 --------- DP Stroke index (SV/bsa), 35.8 ml/m^2 33.6 --------- LVOT DP  Aorta Value 01/27/2012 Reference Aortic root ID, ED 32 mm 32 --------- Ascending aorta ID, A-P, 33 mm ---------- --------- S  Left atrium Value 01/27/2012 Reference LA ID, A-P, ES 32 mm 35 --------- LA ID/bsa, A-P (N) 2.09 cm/m^2 2.11 <=2.2  Mitral valve Value 01/27/2012 Reference Mitral E-wave peak 66.1 cm/s 54.3 --------- velocity Mitral A-wave peak 77.5 cm/s 80.9 --------- velocity Mitral deceleration time (H) 243 ms 268 150 - 230 Mitral E/A ratio, peak 0.9 0.7 ---------  Right ventricle Value 01/27/2012 Reference RV s&', lateral, S 9.1 cm/s 13.1 ---------  Legend: (L) and (H) mark values outside specified reference range.  (N) marks values inside specified reference range.     CHEST 2 VIEW  COMPARISON: Two-view chest 02/13/2014.  FINDINGS:  The right-sided central venous catheter is been removed. Small  bilateral effusions are appreciated right greater than left. The  left effusion is slightly smaller when compared to previous. Areas  of increased density within the lung bases. Cardiac silhouette is  stable. Patient is status post median sternotomy. Atherosclerotic  calcifications within the aorta. No further focal regions of  consolidation. Degenerative changes within the shoulders. No acute  osseous abnormalities.  IMPRESSION:  Small bilateral effusions right greater than left. Left effusion  decreased from prior.  Likely areas of atelectasis within the lung bases.  Electronically Signed  By: Margaree Mackintosh M.D.  On: 03/06/2014 16:30    Impression:  Patient has made remarkable progress  now 6 weeks following emergency straight graft repair of contained rupture of the infected false aneurysm (mycotic aneurysm) of the ascending thoracic aorta.   Plan:  I've encouraged patient to continue to gradually increase his physical activity with his limitations remaining that he refrain from heavy lifting or strenuous use of his arms or shoulders. I think he may resume driving an automobile. I've encouraged him to get started in the cardiac rehabilitation program. I would favor  continuing antibiotics for a total of at least 3 months from the time of his original presentation. He will need to remain on Coumadin for that length of time as well. The patient will return in followup in 8 weeks.   Valentina Gu. Roxy Manns, MD 03/06/2014 4:39 PM

## 2014-03-07 ENCOUNTER — Encounter: Payer: Self-pay | Admitting: Thoracic Surgery (Cardiothoracic Vascular Surgery)

## 2014-03-07 DIAGNOSIS — J309 Allergic rhinitis, unspecified: Secondary | ICD-10-CM | POA: Diagnosis not present

## 2014-03-08 DIAGNOSIS — M5030 Other cervical disc degeneration, unspecified cervical region: Secondary | ICD-10-CM | POA: Diagnosis not present

## 2014-03-08 DIAGNOSIS — I10 Essential (primary) hypertension: Secondary | ICD-10-CM | POA: Diagnosis not present

## 2014-03-08 DIAGNOSIS — M216X9 Other acquired deformities of unspecified foot: Secondary | ICD-10-CM | POA: Diagnosis not present

## 2014-03-08 DIAGNOSIS — Z48812 Encounter for surgical aftercare following surgery on the circulatory system: Secondary | ICD-10-CM | POA: Diagnosis not present

## 2014-03-08 DIAGNOSIS — A403 Sepsis due to Streptococcus pneumoniae: Secondary | ICD-10-CM | POA: Diagnosis not present

## 2014-03-08 DIAGNOSIS — Z452 Encounter for adjustment and management of vascular access device: Secondary | ICD-10-CM | POA: Diagnosis not present

## 2014-03-09 NOTE — Telephone Encounter (Signed)
lmptcb for echo results. I will post into MY Chart as well today

## 2014-03-10 DIAGNOSIS — J309 Allergic rhinitis, unspecified: Secondary | ICD-10-CM | POA: Diagnosis not present

## 2014-03-13 ENCOUNTER — Ambulatory Visit (INDEPENDENT_AMBULATORY_CARE_PROVIDER_SITE_OTHER): Payer: Medicare Other

## 2014-03-13 DIAGNOSIS — I2699 Other pulmonary embolism without acute cor pulmonale: Secondary | ICD-10-CM

## 2014-03-13 DIAGNOSIS — J309 Allergic rhinitis, unspecified: Secondary | ICD-10-CM | POA: Diagnosis not present

## 2014-03-13 LAB — POCT INR: INR: 1.8

## 2014-03-14 DIAGNOSIS — Z0279 Encounter for issue of other medical certificate: Secondary | ICD-10-CM

## 2014-03-15 ENCOUNTER — Telehealth: Payer: Self-pay | Admitting: *Deleted

## 2014-03-15 DIAGNOSIS — B955 Unspecified streptococcus as the cause of diseases classified elsewhere: Secondary | ICD-10-CM

## 2014-03-15 DIAGNOSIS — R7881 Bacteremia: Secondary | ICD-10-CM | POA: Insufficient documentation

## 2014-03-15 MED ORDER — RIFAMPIN 300 MG PO CAPS: 300.0000 mg | ORAL_CAPSULE | Freq: Every day | ORAL | Status: DC

## 2014-03-15 NOTE — Telephone Encounter (Signed)
Needing refill on rifampin.

## 2014-03-16 DIAGNOSIS — J309 Allergic rhinitis, unspecified: Secondary | ICD-10-CM | POA: Diagnosis not present

## 2014-03-21 ENCOUNTER — Encounter: Payer: Self-pay | Admitting: Physician Assistant

## 2014-03-21 ENCOUNTER — Ambulatory Visit (INDEPENDENT_AMBULATORY_CARE_PROVIDER_SITE_OTHER): Payer: Medicare Other | Admitting: Physician Assistant

## 2014-03-21 ENCOUNTER — Ambulatory Visit (INDEPENDENT_AMBULATORY_CARE_PROVIDER_SITE_OTHER): Payer: Medicare Other | Admitting: *Deleted

## 2014-03-21 VITALS — BP 150/88 | HR 78 | Ht 63.0 in | Wt 117.0 lb

## 2014-03-21 DIAGNOSIS — I711 Thoracic aortic aneurysm, ruptured, unspecified: Secondary | ICD-10-CM | POA: Diagnosis not present

## 2014-03-21 DIAGNOSIS — I2699 Other pulmonary embolism without acute cor pulmonale: Secondary | ICD-10-CM | POA: Diagnosis not present

## 2014-03-21 DIAGNOSIS — I6350 Cerebral infarction due to unspecified occlusion or stenosis of unspecified cerebral artery: Secondary | ICD-10-CM

## 2014-03-21 DIAGNOSIS — R000 Tachycardia, unspecified: Secondary | ICD-10-CM

## 2014-03-21 DIAGNOSIS — I10 Essential (primary) hypertension: Secondary | ICD-10-CM | POA: Diagnosis not present

## 2014-03-21 LAB — POCT INR: INR: 1.9

## 2014-03-21 NOTE — Patient Instructions (Signed)
Continue current medications.  Keep an eye on your blood pressure.  If it remains greater than 140/90, call [phone].  Follow up with Dr. Kirk Ruths in 3 months.

## 2014-03-21 NOTE — Progress Notes (Signed)
Cardiology Office Note    Date:  03/21/2014   ID:  Danny Frederick, DOB 04-17-1934, MRN [id number]  PCP:  Vikki Ports, MD  Cardiologist:  Dr. Kirk Ruths      History of Present Illness: Danny Frederick is a 80 y.o. male with a hx of probably micturition syncope, HTN, hypothyroidism.      Admitted 12/2013 with a contained, ruptured ascending thoracic aortic aneurysm in the setting of streptococcal pneumoniae bacteremia/aortitis.  He underwent repair of the ascending thoracic aortic aneurysm with a 32 mm Hemashield straight graft repair. He was also followed by infectious disease and treated with antibiotics. The patient was noted to have clot in the main pulmonary artery. This was likely related to compression of the pulmonary artery by the false aneurysm. He was started on Coumadin.    I saw him 03/01/14. His heart rate was 123. I increased his beta blocker.  Echocardiogram demonstrated normal LV function and no evidence of pericardial effusion.  He returns for followup.The patient denies any chest pain, significant dyspnea, syncope, orthopnea, PND, edema.  Studies:  - Echocardiogram (12/2011):  Normal LV function, grade 1 diastolic dysfunction and mild right atrial enlargement.   - Echo (03/02/12): Mild LVH, EF 28-78%, grade 1 diastolic dysfunction, mild MR, mildly reduced RV function  - CardioNet monitor (12/2011):  sinus rhythm PAC and PVC   Recent Labs: 12/08/2013: TSH 3.214  02/02/2014: ALT 28; Creatinine 0.69; Hemoglobin 11.3*; Potassium 3.7; Pro B Natriuretic peptide (BNP) 3712.0*   Wt Readings from Last 3 Encounters:  03/21/14 117 lb (53.071 kg)  03/06/14 115 lb (52.164 kg)  03/01/14 116 lb (52.617 kg)     Past Medical History  Diagnosis Date  . Hypertension   . Unspecified hypothyroidism   . Mitral valve prolapse     h/o; normal echo 12/2011 with no MVP seen  . Foot drop, right   . BPH (benign prostatic hypertrophy)   . Iron deficiency anemia, unspecified 6/09  .  Hearing loss in left ear   . Diverticulosis of colon 12/09  . Internal hemorrhoids   . GERD (gastroesophageal reflux disease)   . Allergic rhinitis, cause unspecified     on allergy shots (Dr. Orvil Feil)  . Allergic conjunctivitis   . Ruptured thoracic aortic aneurysm 01/23/2014  . 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 Right ventricular dysfunction 01/23/2014    Secondary to obstruction of main pulmonary artery  . Hx of echocardiogram     Echo (03/2014):  Mild LVH, EF 50-55%, no RWMA, Gr 1 DD, mild MR, mild reduced RVSF    Current Outpatient Prescriptions  Medication Sig Dispense Refill  . amoxicillin (AMOXIL) 500 MG tablet Take 1 tablet (500 mg total) by mouth 2 (two) times daily.  90 tablet  3  . aspirin 81 MG tablet Take 81 mg by mouth at bedtime.       . citalopram (CELEXA) 20 MG tablet Take 1 tablet (20 mg total) by mouth daily.  30 tablet  1  . Ferrous Sulfate (IRON) 325 (65 FE) MG TABS Take 1 tablet by mouth every other day.      . furosemide (LASIX) 20 MG tablet Take 1 tablet (20 mg total) by mouth daily.  30 tablet  0  . levothyroxine (SYNTHROID, LEVOTHROID) 50 MCG tablet Take 50 mcg by mouth daily before breakfast.      . lisinopril (PRINIVIL,ZESTRIL) 5 MG tablet Take 1 tablet (5 mg total) by mouth daily.  30 tablet  11  .  metoprolol tartrate (LOPRESSOR) 50 MG tablet Take 1 tablet (50 mg total) by mouth 2 (two) times daily.  60 tablet  11  . mirtazapine (REMERON) 7.5 MG tablet Take 1 tablet (7.5 mg total) by mouth at bedtime.  30 tablet  1  . omeprazole (PRILOSEC) 20 MG capsule Take 20 mg by mouth every other day.      . ondansetron (ZOFRAN) 4 MG tablet Take 1 tablet (4 mg total) by mouth every 8 (eight) hours as needed for nausea or vomiting.  20 tablet  0  . potassium chloride (K-DUR,KLOR-CON) 10 MEQ tablet Take 1 tablet (10 mEq total) by mouth daily.  30 tablet  1  . rifampin (RIFADIN) 300 MG capsule Take 300 mg by mouth daily. ANTIBIOTIC      . warfarin (COUMADIN) 1 MG tablet  Take as directed by Coumadin Clinic  150 tablet  1   No current facility-administered medications for this visit.    Allergies:   Review of patient's allergies indicates no known allergies.   Social History:  The patient  reports that he quit smoking about 36 years ago. His smoking use included Cigarettes. He smoked 0.00 packs per day. He has never used smokeless tobacco. He reports that he drinks alcohol. He reports that he does not use illicit drugs.   Family History:  The patient's family history includes Cancer in his brother and brother; Depression in his mother; Diabetes in his mother and sister; Emphysema in his brother; Heart disease in his mother; Hypertension in his father and mother; Parkinsonism in his father; Stroke in his father. There is no history of Colon cancer.   ROS:  Please see the history of present illness.  He denies any bleeding problems.    All other systems reviewed and negative.   PHYSICAL EXAM: VS:  BP 150/88  Pulse 78  Ht 5\' 3"  (1.6 m)  Wt 117 lb (53.071 kg)  BMI 20.73 kg/m2 Well nourished, well developed, in no acute distress HEENT: normal Neck: no JVD Cardiac:  normal S1, S2; RRR; no murmur  Lungs:  clear to auscultation bilaterally, no wheezing, rhonchi or rales Abd: soft, nontender, no hepatomegaly Ext: no edema Skin: warm and dry Neuro:  CNs 2-12 intact, no focal abnormalities noted  EKG:  NSR, HR 78, inferolateral T wave inversions     ASSESSMENT AND PLAN:  1. Tachycardia:  Resolved. Continue current therapy. 2. Ruptured thoracic aortic aneurysm status post repair: Followup with Dr. Roxy Manns as planned. 3. Pulmonary artery thrombosis:  Continue coumadin for now. Recommendation is to continue for a total of 3 mos. 4. Essential hypertension, benign:  Blood pressure somewhat elevated today. He does admit to a high salt meal prior to coming in. He will keep an eye on his blood pressure and notify me if he is above target. 5. Disposition:  F/u with Dr.  Kirk Ruths in 3 months.    Signed, Versie Starks, MHS 03/21/2014 12:48 PM    [facility] [place], [place], [place]  [address] Phone: [phone]; Fax: [phone]

## 2014-03-22 DIAGNOSIS — J309 Allergic rhinitis, unspecified: Secondary | ICD-10-CM | POA: Diagnosis not present

## 2014-03-23 ENCOUNTER — Emergency Department (HOSPITAL_COMMUNITY): Payer: Medicare Other

## 2014-03-23 ENCOUNTER — Emergency Department (HOSPITAL_COMMUNITY)
Admission: EM | Admit: 2014-03-23 | Discharge: 2014-03-23 | Disposition: A | Discharge status: Home / Self Care | Payer: Medicare Other | Attending: Emergency Medicine | Admitting: Emergency Medicine

## 2014-03-23 ENCOUNTER — Encounter: Payer: Self-pay | Admitting: Family Medicine

## 2014-03-23 ENCOUNTER — Encounter (HOSPITAL_COMMUNITY): Payer: Self-pay | Admitting: Emergency Medicine

## 2014-03-23 ENCOUNTER — Other Ambulatory Visit: Payer: Self-pay | Admitting: Physician Assistant

## 2014-03-23 DIAGNOSIS — Z79899 Other long term (current) drug therapy: Secondary | ICD-10-CM | POA: Insufficient documentation

## 2014-03-23 DIAGNOSIS — Y92009 Unspecified place in unspecified non-institutional (private) residence as the place of occurrence of the external cause: Secondary | ICD-10-CM | POA: Insufficient documentation

## 2014-03-23 DIAGNOSIS — I10 Essential (primary) hypertension: Secondary | ICD-10-CM | POA: Diagnosis not present

## 2014-03-23 DIAGNOSIS — H9190 Unspecified hearing loss, unspecified ear: Secondary | ICD-10-CM | POA: Insufficient documentation

## 2014-03-23 DIAGNOSIS — S0180XA Unspecified open wound of other part of head, initial encounter: Secondary | ICD-10-CM | POA: Diagnosis not present

## 2014-03-23 DIAGNOSIS — W108XXA Fall (on) (from) other stairs and steps, initial encounter: Secondary | ICD-10-CM | POA: Insufficient documentation

## 2014-03-23 DIAGNOSIS — Z792 Long term (current) use of antibiotics: Secondary | ICD-10-CM | POA: Insufficient documentation

## 2014-03-23 DIAGNOSIS — E039 Hypothyroidism, unspecified: Secondary | ICD-10-CM | POA: Insufficient documentation

## 2014-03-23 DIAGNOSIS — I712 Thoracic aortic aneurysm, without rupture, unspecified: Secondary | ICD-10-CM | POA: Insufficient documentation

## 2014-03-23 DIAGNOSIS — Z7901 Long term (current) use of anticoagulants: Secondary | ICD-10-CM | POA: Diagnosis not present

## 2014-03-23 DIAGNOSIS — S0190XA Unspecified open wound of unspecified part of head, initial encounter: Secondary | ICD-10-CM | POA: Diagnosis not present

## 2014-03-23 DIAGNOSIS — S0990XA Unspecified injury of head, initial encounter: Secondary | ICD-10-CM | POA: Diagnosis not present

## 2014-03-23 DIAGNOSIS — Z87891 Personal history of nicotine dependence: Secondary | ICD-10-CM | POA: Diagnosis not present

## 2014-03-23 DIAGNOSIS — K219 Gastro-esophageal reflux disease without esophagitis: Secondary | ICD-10-CM | POA: Insufficient documentation

## 2014-03-23 DIAGNOSIS — D649 Anemia, unspecified: Secondary | ICD-10-CM | POA: Diagnosis not present

## 2014-03-23 DIAGNOSIS — I059 Rheumatic mitral valve disease, unspecified: Secondary | ICD-10-CM | POA: Diagnosis not present

## 2014-03-23 DIAGNOSIS — Y9301 Activity, walking, marching and hiking: Secondary | ICD-10-CM | POA: Insufficient documentation

## 2014-03-23 DIAGNOSIS — IMO0002 Reserved for concepts with insufficient information to code with codable children: Secondary | ICD-10-CM

## 2014-03-23 DIAGNOSIS — Z7982 Long term (current) use of aspirin: Secondary | ICD-10-CM | POA: Insufficient documentation

## 2014-03-23 LAB — PROTIME-INR
INR: 1.75 — AB (ref 0.00–1.49)
PROTHROMBIN TIME: 20.4 s — AB (ref 11.6–15.2)

## 2014-03-23 NOTE — ED Provider Notes (Signed)
CSN: [id number]     Arrival date & time 03/23/14  1511 History   First MD Initiated Contact with Patient 03/23/14 1525     Chief Complaint  Patient presents with  . Fall      HPI  Danny Frederick presents after a fall at home earlier. He was walking down some steps carrying a small box. He missed his balance on the last step. He fell to his right side against a cabinet. He then fell forward. He went to the ground he did not hit his head on the ground according to his granddaughter who is behind him. He has a laceration adjacent his right eyebrow. No headache. No confusion. No loss of consciousness. He is on Coumadin. He has a history of a thoracic aortic aneurysm repair.   Past Medical History  Diagnosis Date  . Hypertension   . Unspecified hypothyroidism   . Mitral valve prolapse     h/o; normal echo 12/2011 with no MVP seen  . Foot drop, right   . BPH (benign prostatic hypertrophy)   . Iron deficiency anemia, unspecified 6/09  . Hearing loss in left ear   . Diverticulosis of colon 12/09  . Internal hemorrhoids   . GERD (gastroesophageal reflux disease)   . Allergic rhinitis, cause unspecified     on allergy shots (Dr. Orvil Feil)  . Allergic conjunctivitis   . Ruptured thoracic aortic aneurysm 01/23/2014  . 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 Right ventricular dysfunction 01/23/2014    Secondary to obstruction of main pulmonary artery  . Hx of echocardiogram     Echo (03/2014):  Mild LVH, EF 50-55%, no RWMA, Gr 1 DD, mild MR, mild reduced RVSF   Past Surgical History  Procedure Laterality Date  . Prostate surgery  2007    photovaporization  . Cervical laminectomy  1972    C5-6  . Spine surgery  2006    L4-5 disk surgery  . Rotator cuff repair  10/2008    left; Dr. Onnie Graham  . Esophagogastroduodenoscopy  10/31/08    normal; Dr. Edison Nasuti  . Tonsillectomy    . Hemorroidal banding  04/07/2013    x3-Dr.Eric Redmond Pulling  . Hemorrhoidectomy with hemorrhoid banding  04/07/13  . Spine  surgery  04/2013    L4-5, L5-S1 fusion.  Dr. Christella Noa  . Thoracic aortic aneurysm repair N/A 01/23/2014    Procedure: THORACIC ASCENDING ANEURYSM REPAIR (AAA);  Surgeon: Rexene Alberts, MD;  Location: [facility];  Service: Open Heart Surgery;  Laterality: N/A;   Family History  Problem Relation Age of Onset  . Diabetes Mother   . Heart disease Mother     CHF  . Stroke Father   . Parkinsonism Father   . Cancer Brother     bladder cancer  . Cancer Brother     prostate cancer  . Emphysema Brother   . Diabetes Sister   . Colon cancer Neg Hx   . Hypertension Mother   . Hypertension Father   . Depression Mother    History  Substance Use Topics  . Smoking status: Former Smoker    Types: Cigarettes    Quit date: 09/01/1977  . Smokeless tobacco: Never Used  . Alcohol Use: Yes     Comment: 1-2 glasses wine per day    Review of Systems  Constitutional: Negative for fever, chills, diaphoresis, appetite change and fatigue.  HENT: Negative for mouth sores, sore throat and trouble swallowing.   Eyes: Negative for visual disturbance.  Respiratory:  Negative for cough, chest tightness, shortness of breath and wheezing.   Cardiovascular: Negative for chest pain.  Gastrointestinal: Negative for nausea, vomiting, abdominal pain, diarrhea and abdominal distention.  Endocrine: Negative for polydipsia, polyphagia and polyuria.  Genitourinary: Negative for dysuria, frequency and hematuria.  Musculoskeletal: Negative for gait problem.  Skin: Positive for wound. Negative for color change, pallor and rash.  Neurological: Negative for dizziness, syncope, light-headedness and headaches.  Hematological: Does not bruise/bleed easily.  Psychiatric/Behavioral: Negative for behavioral problems and confusion.      Allergies  Review of patient's allergies indicates no known allergies.  Home Medications   Prior to Admission medications   Medication Sig Start Date End Date Taking? Authorizing Provider    amoxicillin (AMOXIL) 500 MG tablet Take 1 tablet (500 mg total) by mouth 2 (two) times daily. 03/01/14  Yes Carlyle Basques, MD  aspirin 81 MG tablet Take 81 mg by mouth at bedtime.    Yes Historical Provider, MD  Ferrous Sulfate (IRON) 325 (65 FE) MG TABS Take 1 tablet by mouth every other day.   Yes Historical Provider, MD  levothyroxine (SYNTHROID, LEVOTHROID) 50 MCG tablet Take 50 mcg by mouth daily before breakfast.   Yes Historical Provider, MD  lisinopril (PRINIVIL,ZESTRIL) 5 MG tablet Take 1 tablet (5 mg total) by mouth daily. 03/01/14  Yes Liliane Shi, PA-C  metoprolol tartrate (LOPRESSOR) 50 MG tablet Take 1 tablet (50 mg total) by mouth 2 (two) times daily. 03/01/14  Yes Scott Joylene Draft, PA-C  mirtazapine (REMERON) 7.5 MG tablet Take 1 tablet (7.5 mg total) by mouth at bedtime. 02/04/14  Yes Coolidge Breeze, PA-C  omeprazole (PRILOSEC) 20 MG capsule Take 20 mg by mouth every other day.   Yes Historical Provider, MD  rifampin (RIFADIN) 300 MG capsule Take 300 mg by mouth daily. ANTIBIOTIC 03/15/14  Yes Carlyle Basques, MD  warfarin (COUMADIN) 1 MG tablet Take 5-6 mg by mouth daily. M,W,F 6mg  T, TH, S,S 5mg    Yes Historical Provider, MD   BP 163/96  Pulse 86  SpO2 99% Physical Exam  Constitutional: He is oriented to person, place, and time. He appears well-developed and well-nourished. No distress.  HENT:  Head: Normocephalic.    Eyes: Conjunctivae are normal. Pupils are equal, round, and reactive to light. No scleral icterus.  Neck: Normal range of motion. Neck supple. No thyromegaly present.  Cardiovascular: Normal rate and regular rhythm.  Exam reveals no gallop and no friction rub.   No murmur heard. Pulmonary/Chest: Effort normal and breath sounds normal. No respiratory distress. He has no wheezes. He has no rales.  Abdominal: Soft. Bowel sounds are normal. He exhibits no distension. There is no tenderness. There is no rebound.  Musculoskeletal: Normal range of motion.   Neurological: He is alert and oriented to person, place, and time.  Skin: Skin is warm and dry. No rash noted.  Psychiatric: He has a normal mood and affect. His behavior is normal.    ED Course  Procedures (including critical care time) Labs Review Labs Reviewed  PROTIME-INR - Abnormal; Notable for the following:    Prothrombin Time 20.4 (*)    INR 1.75 (*)    All other components within normal limits    Imaging Review Ct Head Wo Contrast  03/23/2014   CLINICAL DATA:  History of fall with right frontal laceration. Patient is on Coumadin.  EXAM: CT HEAD WITHOUT CONTRAST  TECHNIQUE: Contiguous axial images were obtained from the base of the skull through the  vertex without intravenous contrast.  COMPARISON:  Head CT 01/27/2014.  FINDINGS: Patchy and confluent areas of decreased attenuation are noted throughout the deep and periventricular white matter of the cerebral hemispheres bilaterally, compatible with chronic microvascular ischemic disease. No acute displaced skull fractures are identified. No acute intracranial abnormality. Specifically, no evidence of acute post-traumatic intracranial hemorrhage, no definite regions of acute/subacute cerebral ischemia, no focal mass, mass effect, hydrocephalus or abnormal intra or extra-axial fluid collections. The visualized paranasal sinuses and mastoids are well pneumatized.  IMPRESSION: 1. No acute displaced skull fracture or findings of significant acute intracranial trauma. 2. Chronic microvascular ischemic changes in the cerebral white matter similar to the prior examination, as above.   Electronically Signed   By: Vinnie Langton M.D.   On: 03/23/2014 18:01     EKG Interpretation None      MDM   Final diagnoses:  Laceration    Pressure dressing applied. He can remove this later tonight. Basic wound care. Suture removal in 7-10 days.    Tanna Furry, MD 03/23/14 [phone]

## 2014-03-23 NOTE — ED Notes (Signed)
Pt in stating that he tripped earlier and hit his head, denies LOC, takes coumadin, went to an urgent care and was told to come here for head CT, laceration above right eye with no bleeding at this time, pt alert and oriented, no distress noted.

## 2014-03-23 NOTE — Discharge Instructions (Signed)
Suture removal in 7-10 days  Laceration Care, Adult A laceration is a cut that goes through all layers of the skin. The cut goes into the tissue beneath the skin. HOME CARE For stitches (sutures) or staples:  Keep the cut clean and dry.  If you have a bandage (dressing), change it at least once a day. Change the bandage if it gets wet or dirty, or as told by your doctor.  Wash the cut with soap and water 2 times a day. Rinse the cut with water. Pat it dry with a clean towel.  Put a thin layer of medicated cream on the cut as told by your doctor.  You may shower after the first 24 hours. Do not soak the cut in water until the stitches are removed.  Only take medicines as told by your doctor.  Have your stitches or staples removed as told by your doctor. For skin adhesive strips:  Keep the cut clean and dry.  Do not get the strips wet. You may take a bath, but be careful to keep the cut dry.  If the cut gets wet, pat it dry with a clean towel.  The strips will fall off on their own. Do not remove the strips that are still stuck to the cut. For wound glue:  You may shower or take baths. Do not soak or scrub the cut. Do not swim. Avoid heavy sweating until the glue falls off on its own. After a shower or bath, pat the cut dry with a clean towel.  Do not put medicine on your cut until the glue falls off.  If you have a bandage, do not put tape over the glue.  Avoid lots of sunlight or tanning lamps until the glue falls off. Put sunscreen on the cut for the first year to reduce your scar.  The glue will fall off on its own. Do not pick at the glue. You may need a tetanus shot if:  You cannot remember when you had your last tetanus shot.  You have never had a tetanus shot. If you need a tetanus shot and you choose not to have one, you may get tetanus. Sickness from tetanus can be serious. GET HELP RIGHT AWAY IF:   Your pain does not get better with medicine.  Your arm,  hand, leg, or foot loses feeling (numbness) or changes color.  Your cut is bleeding.  Your joint feels weak, or you cannot use your joint.  You have painful lumps on your body.  Your cut is red, puffy (swollen), or painful.  You have a red line on the skin near the cut.  You have yellowish-white fluid (pus) coming from the cut.  You have a fever.  You have a bad smell coming from the cut or bandage.  Your cut breaks open before or after stitches are removed.  You notice something coming out of the cut, such as wood or glass.  You cannot move a finger or toe. MAKE SURE YOU:   Understand these instructions.  Will watch your condition.  Will get help right away if you are not doing well or get worse. Document Released: 02/04/2008 Document Revised: 11/10/2011 Document Reviewed: 02/11/2011 [facility] Patient Information 2015 [place], [place]. This information is not intended to replace advice given to you by your health care provider. Make sure you discuss any questions you have with your health care provider.

## 2014-03-23 NOTE — Progress Notes (Signed)
Rt ear hearing aid removed for the CT Head exam. Rt ear hearing aid placed back in Rt ear after scan prior to patient leaving the scan room.

## 2014-03-26 ENCOUNTER — Encounter: Payer: Self-pay | Admitting: Thoracic Surgery (Cardiothoracic Vascular Surgery)

## 2014-03-29 ENCOUNTER — Encounter: Payer: Self-pay | Admitting: Internal Medicine

## 2014-03-29 ENCOUNTER — Ambulatory Visit (INDEPENDENT_AMBULATORY_CARE_PROVIDER_SITE_OTHER): Payer: Medicare Other | Admitting: Internal Medicine

## 2014-03-29 VITALS — BP 167/97 | HR 73 | Temp 98.2°F | Wt 119.0 lb

## 2014-03-29 DIAGNOSIS — F329 Major depressive disorder, single episode, unspecified: Secondary | ICD-10-CM | POA: Diagnosis not present

## 2014-03-29 DIAGNOSIS — F32A Depression, unspecified: Secondary | ICD-10-CM

## 2014-03-29 DIAGNOSIS — J309 Allergic rhinitis, unspecified: Secondary | ICD-10-CM | POA: Diagnosis not present

## 2014-03-29 DIAGNOSIS — F3289 Other specified depressive episodes: Secondary | ICD-10-CM

## 2014-03-29 DIAGNOSIS — A491 Streptococcal infection, unspecified site: Secondary | ICD-10-CM

## 2014-03-29 DIAGNOSIS — I6350 Cerebral infarction due to unspecified occlusion or stenosis of unspecified cerebral artery: Secondary | ICD-10-CM

## 2014-03-29 LAB — COMPLETE METABOLIC PANEL WITH GFR
ALT: 8 U/L (ref 0–53)
AST: 16 U/L (ref 0–37)
Albumin: 3.4 g/dL — ABNORMAL LOW (ref 3.5–5.2)
Alkaline Phosphatase: 91 U/L (ref 39–117)
BUN: 12 mg/dL (ref 6–23)
CALCIUM: 9 mg/dL (ref 8.4–10.5)
CHLORIDE: 99 meq/L (ref 96–112)
CO2: 25 meq/L (ref 19–32)
Creat: 0.73 mg/dL (ref 0.50–1.35)
GFR, EST NON AFRICAN AMERICAN: 89 mL/min
GLUCOSE: 60 mg/dL — AB (ref 70–99)
POTASSIUM: 4.7 meq/L (ref 3.5–5.3)
SODIUM: 133 meq/L — AB (ref 135–145)
TOTAL PROTEIN: 7.5 g/dL (ref 6.0–8.3)
Total Bilirubin: 0.7 mg/dL (ref 0.2–1.2)

## 2014-03-29 LAB — CBC WITH DIFFERENTIAL/PLATELET
Basophils Absolute: 0.1 10*3/uL (ref 0.0–0.1)
Basophils Relative: 1 % (ref 0–1)
Eosinophils Absolute: 0.2 10*3/uL (ref 0.0–0.7)
Eosinophils Relative: 3 % (ref 0–5)
HCT: 34.2 % — ABNORMAL LOW (ref 39.0–52.0)
HEMOGLOBIN: 11.4 g/dL — AB (ref 13.0–17.0)
LYMPHS ABS: 1.6 10*3/uL (ref 0.7–4.0)
LYMPHS PCT: 22 % (ref 12–46)
MCH: 26.9 pg (ref 26.0–34.0)
MCHC: 33.3 g/dL (ref 30.0–36.0)
MCV: 80.7 fL (ref 78.0–100.0)
MONOS PCT: 12 % (ref 3–12)
Monocytes Absolute: 0.9 10*3/uL (ref 0.1–1.0)
NEUTROS ABS: 4.5 10*3/uL (ref 1.7–7.7)
NEUTROS PCT: 62 % (ref 43–77)
Platelets: 374 10*3/uL (ref 150–400)
RBC: 4.24 MIL/uL (ref 4.22–5.81)
RDW: 16.3 % — ABNORMAL HIGH (ref 11.5–15.5)
WBC: 7.3 10*3/uL (ref 4.0–10.5)

## 2014-03-29 MED ORDER — RIFAMPIN 300 MG PO CAPS: 300.0000 mg | ORAL_CAPSULE | Freq: Every day | ORAL | Status: DC

## 2014-03-29 MED ORDER — CITALOPRAM HYDROBROMIDE 10 MG PO TABS: 10.0000 mg | ORAL_TABLET | Freq: Every day | ORAL | Status: DC

## 2014-03-29 NOTE — Progress Notes (Signed)
Subjective:    Patient ID: Danny Frederick, male    DOB: 1933/11/24, 80 y.o.   MRN: [id number]  HPI Danny Frederick is a 80yo M who was recently admitted for thoracic aortic aneurysm who required extensive cardiothoracic surgery/repair in setting of sepsis due to streptococcal infection. He was discharged on 6-8wk of IV antibiotics then to convert to oral antibiotics for 2-3 months post surgery. Tolerating antibiotics without difficulty. No thrush no diarrhea. occ bright orange urine due to rifampin  Current Outpatient Prescriptions on File Prior to Visit  Medication Sig Dispense Refill  . amoxicillin (AMOXIL) 500 MG tablet Take 1 tablet (500 mg total) by mouth 2 (two) times daily.  90 tablet  3  . aspirin 81 MG tablet Take 81 mg by mouth at bedtime.       . Ferrous Sulfate (IRON) 325 (65 FE) MG TABS Take 1 tablet by mouth every other day.      . levothyroxine (SYNTHROID, LEVOTHROID) 50 MCG tablet Take 50 mcg by mouth daily before breakfast.      . lisinopril (PRINIVIL,ZESTRIL) 5 MG tablet Take 1 tablet (5 mg total) by mouth daily.  30 tablet  11  . metoprolol tartrate (LOPRESSOR) 50 MG tablet Take 1 tablet (50 mg total) by mouth 2 (two) times daily.  60 tablet  11  . omeprazole (PRILOSEC) 20 MG capsule Take 20 mg by mouth every other day.      . warfarin (COUMADIN) 1 MG tablet Take 5-6 mg by mouth daily. M,W,F 6mg  T, TH, S,S 5mg        No current facility-administered medications on file prior to visit.   Active Ambulatory Problems    Diagnosis Date Noted  . IRON DEFICIENCY ANEMIA SECONDARY TO BLOOD LOSS 11/08/2008  . ANEMIA 05/26/2008  . HYPERTENSION 03/02/2008  . DYSPNEA 03/02/2008  . Unspecified hypothyroidism 01/22/2011  . Essential hypertension, benign 01/22/2011  . GERD (gastroesophageal reflux disease) 01/22/2011  . Iron deficiency anemia, unspecified 01/22/2011  . Syncope 01/22/2012  . Allergic conjunctivitis 10/20/2012  . Solitary pulmonary nodule 12/08/2013  . Ruptured  thoracic aortic aneurysm 01/23/2014  . 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 Right ventricular dysfunction 01/23/2014  . Bacteremia   . Depression, major, in remission 03/30/2014   Resolved Ambulatory Problems    Diagnosis Date Noted  . Internal hemorrhoids 09/23/2012  . Pulmonary embolus 01/23/2014   Past Medical History  Diagnosis Date  . Hypertension   . Mitral valve prolapse   . Foot drop, right   . BPH (benign prostatic hypertrophy)   . Hearing loss in left ear   . Diverticulosis of colon 12/09  . Allergic rhinitis, cause unspecified   . Hx of echocardiogram      Review of Systems 10 point ros is negative    Objective:   Physical Exam BP 167/97  Pulse 73  Temp(Src) 98.2 F (36.8 C) (Oral)  Wt 119 lb (53.978 kg) Physical Exam  Constitutional: He is oriented to person, place, and time. He appears well-developed and well-nourished. No distress.  HENT:  Mouth/Throat: Oropharynx is clear and moist. No oropharyngeal exudate.  Cardiovascular: Normal rate, regular rhythm and normal heart sounds. Exam reveals no gallop and no friction rub.  No murmur heard.  Pulmonary/Chest: Effort normal and breath sounds normal. No respiratory distress. He has no wheezes.  Abdominal: Soft. Bowel sounds are normal. He exhibits no distension. There is no tenderness.  Lymphadenopathy:  He has no cervical adenopathy.  Neurological: He is alert and oriented  to person, place, and time.  Skin: Surgical incision to chest/abd are well healed Psychiatric: He has a normal mood and affect. His behavior is normal.        Assessment & Plan:  streptococcal infection  thoractic aortic aneurysm =  plan to continue for a minimum of 3 months from his surgery, aim to do longer if no difficulty tolerating antibiotics. Will check cbc and cmp today to ensure no side effects of rifampin. Will give refill on amox and rifampin  Depression = appears improved. Will decrease celexa to 10mg  daily

## 2014-03-30 ENCOUNTER — Encounter: Payer: Self-pay | Admitting: Family Medicine

## 2014-03-30 ENCOUNTER — Ambulatory Visit (INDEPENDENT_AMBULATORY_CARE_PROVIDER_SITE_OTHER): Payer: Medicare Other | Admitting: Family Medicine

## 2014-03-30 ENCOUNTER — Ambulatory Visit (INDEPENDENT_AMBULATORY_CARE_PROVIDER_SITE_OTHER): Payer: Medicare Other | Admitting: *Deleted

## 2014-03-30 VITALS — BP 138/84 | HR 72 | Ht 63.0 in | Wt 119.0 lb

## 2014-03-30 DIAGNOSIS — IMO0002 Reserved for concepts with insufficient information to code with codable children: Secondary | ICD-10-CM

## 2014-03-30 DIAGNOSIS — Z4802 Encounter for removal of sutures: Secondary | ICD-10-CM | POA: Diagnosis not present

## 2014-03-30 DIAGNOSIS — F325 Major depressive disorder, single episode, in full remission: Secondary | ICD-10-CM | POA: Insufficient documentation

## 2014-03-30 DIAGNOSIS — I6350 Cerebral infarction due to unspecified occlusion or stenosis of unspecified cerebral artery: Secondary | ICD-10-CM | POA: Diagnosis not present

## 2014-03-30 DIAGNOSIS — I2699 Other pulmonary embolism without acute cor pulmonale: Secondary | ICD-10-CM | POA: Diagnosis not present

## 2014-03-30 LAB — POCT INR: INR: 1.6

## 2014-03-30 MED ORDER — MIRTAZAPINE 7.5 MG PO TABS: 7.5000 mg | ORAL_TABLET | Freq: Every day | ORAL | Status: DC

## 2014-03-30 NOTE — Progress Notes (Signed)
Chief Complaint  Patient presents with  . Suture / Staple Removal    3 sutures above right eyebrow removed today-have been in x 7days. (All meds reconciled)   ER 7/23 with laceration above right eye after falling at home, missing the last step while carrying a box.  He presents today for suture removal.  He had CT due to his being on coumadin, and no bleed was noted.  He denies headaches, dizziness, fevers, drainage/crusting or pain at site.  This is his first visit with me since his hospitalization.  He is doing very well. He had lived with his daughter for a couple of months, but recently moved back to his house.  He has followed up with infectious disease, and remains on oral antibiotics.  He is recovering nicely.  Depression:  Doing much better.  He continues on the citalopram. He was also started on Remeron during the hospitalization.  He stopped it last week when it ran out, had no refills.  He thought he might not need it anymore. He states he is having a hard time gaining back weight.  His appetite is good.  Moods have been good, sleeping well.  Denies any side effects of meds.  Past Medical History  Diagnosis Date  . Hypertension   . Unspecified hypothyroidism   . Mitral valve prolapse     h/o; normal echo 12/2011 with no MVP seen  . Foot drop, right   . BPH (benign prostatic hypertrophy)   . Iron deficiency anemia, unspecified 6/09  . Hearing loss in left ear   . Diverticulosis of colon 12/09  . Internal hemorrhoids   . GERD (gastroesophageal reflux disease)   . Allergic rhinitis, cause unspecified     on allergy shots (Dr. Orvil Feil)  . Allergic conjunctivitis   . Ruptured thoracic aortic aneurysm 01/23/2014  . 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 Right ventricular dysfunction 01/23/2014    Secondary to obstruction of main pulmonary artery  . Hx of echocardiogram     Echo (03/2014):  Mild LVH, EF 50-55%, no RWMA, Gr 1 DD, mild MR, mild reduced RVSF   Past Surgical History   Procedure Laterality Date  . Prostate surgery  2007    photovaporization  . Cervical laminectomy  1972    C5-6  . Spine surgery  2006    L4-5 disk surgery  . Rotator cuff repair  10/2008    left; Dr. Onnie Graham  . Esophagogastroduodenoscopy  10/31/08    normal; Dr. Edison Nasuti  . Tonsillectomy    . Hemorroidal banding  04/07/2013    x3-Dr.Eric Redmond Pulling  . Hemorrhoidectomy with hemorrhoid banding  04/07/13  . Spine surgery  04/2013    L4-5, L5-S1 fusion.  Dr. Christella Noa  . Thoracic aortic aneurysm repair N/A 01/23/2014    Procedure: THORACIC ASCENDING ANEURYSM REPAIR (AAA);  Surgeon: Rexene Alberts, MD;  Location: [facility];  Service: Open Heart Surgery;  Laterality: N/A;   History   Social History  . Marital Status: Married    Spouse Name: N/A    Number of Children: 2  . Years of Education: N/A   Occupational History  . Not on file.   Social History Main Topics  . Smoking status: Former Smoker    Types: Cigarettes    Quit date: 09/01/1977  . Smokeless tobacco: Never Used  . Alcohol Use: Yes     Comment: 1-2 glasses wine per day  . Drug Use: No  . Sexual Activity: Not on file  Other Topics Concern  . Not on file   Social History Narrative   Lives with wife, pet rats (new)   Outpatient Encounter Prescriptions as of 03/30/2014  Medication Sig Note  . ALPRAZolam (XANAX) 0.25 MG tablet Take 0.25 mg by mouth at bedtime as needed.    Marland Kitchen amoxicillin (AMOXIL) 500 MG tablet Take 1 tablet (500 mg total) by mouth 2 (two) times daily.   Marland Kitchen aspirin 81 MG tablet Take 81 mg by mouth at bedtime.    . citalopram (CELEXA) 20 MG tablet Take 20 mg by mouth daily.    . Ferrous Sulfate (IRON) 325 (65 FE) MG TABS Take 1 tablet by mouth every other day.   . levothyroxine (SYNTHROID, LEVOTHROID) 50 MCG tablet Take 50 mcg by mouth daily before breakfast.   . lisinopril (PRINIVIL,ZESTRIL) 5 MG tablet Take 1 tablet (5 mg total) by mouth daily.   . metoprolol tartrate (LOPRESSOR) 50 MG tablet Take 1 tablet (50 mg  total) by mouth 2 (two) times daily.   Marland Kitchen omeprazole (PRILOSEC) 20 MG capsule Take 20 mg by mouth every other day.   . rifampin (RIFADIN) 300 MG capsule Take 1 capsule (300 mg total) by mouth daily. ANTIBIOTIC   . warfarin (COUMADIN) 1 MG tablet Take 5-6 mg by mouth daily. M,W,F 6mg  T, TH, S,S 5mg  03/30/2014: Coumadin clinic  . mirtazapine (REMERON) 7.5 MG tablet Take 1 tablet (7.5 mg total) by mouth at bedtime.   . ondansetron (ZOFRAN) 4 MG tablet Take 4 mg by mouth as needed.    . [DISCONTINUED] amLODipine (NORVASC) 2.5 MG tablet    . [DISCONTINUED] citalopram (CELEXA) 10 MG tablet Take 1 tablet (10 mg total) by mouth daily.   . [DISCONTINUED] furosemide (LASIX) 20 MG tablet    . [DISCONTINUED] mirtazapine (REMERON) 7.5 MG tablet  03/30/2014: He stopped taking this a week ago when he ran out, didn't think he needed it anymore.  Has mentioned it to other providers and thinks refill is being sent.   No Known Allergies  ROS: Denies fevers, headaches, dizziness.  No chest pain, shortness of breath.  Appetite is good. Trouble gaining weight. No bleeding, bruising, rashes. No nausea, vomiting, bowel changes, urinary complaints.   PHYSICAL EXAM: BP 138/84  Pulse 72  Ht 5\' 3"  (1.6 m)  Wt 119 lb (53.978 kg)  BMI 21.09 kg/m2 Well developed, thin, elderly male, in good spirits, in no distress Well healed laceration at lateral aspect of right eyebrow--wound edges are well approximated, no erythema, drainage, crusting, redness or warmth. Normal mood, affect, hygiene and grooming Neuro: alert and oriented.  Cranial nerves intact.  Gait--uses cane for support, otherwise normal. Normal strength.  ASSESSMENT/PLAN:  Depression, major, in remission - continue citalopram.  restart Remeron--might help with appetite, and keep his depression well controlled.  consider stoppng in another 6 months - Plan: mirtazapine (REMERON) 7.5 MG tablet  Visit for suture removal - sutures removed easily without  complication.Slight bleeding from one site, relieved by direct pressure - Plan: PR REMOVAL OF SUTURES   Depression--Discussed remeron in detail.  Since he has been doing so well, recommended that he continue with it. He has no f/u scheduled with behavioral health. Refilled med.

## 2014-03-30 NOTE — Patient Instructions (Signed)
Restart the Remeron at bedtime.  Keep the wound clean--you can remove the bandage whenever you want, later today.

## 2014-04-05 DIAGNOSIS — J309 Allergic rhinitis, unspecified: Secondary | ICD-10-CM | POA: Diagnosis not present

## 2014-04-07 ENCOUNTER — Telehealth: Payer: Self-pay | Admitting: Cardiology

## 2014-04-07 ENCOUNTER — Ambulatory Visit (INDEPENDENT_AMBULATORY_CARE_PROVIDER_SITE_OTHER): Payer: Medicare Other | Admitting: Pharmacist

## 2014-04-07 DIAGNOSIS — I2699 Other pulmonary embolism without acute cor pulmonale: Secondary | ICD-10-CM | POA: Diagnosis not present

## 2014-04-07 LAB — POCT INR: INR: 1.3

## 2014-04-07 MED ORDER — WARFARIN SODIUM 5 MG PO TABS: ORAL_TABLET | ORAL | Status: DC

## 2014-04-07 NOTE — Telephone Encounter (Signed)
Returned call to Danny Frederick - they need some [place] ordered signed and faxed from June. Asked that she re-fax the paperwork needing signatures so that it can be addressed. Provided nurse fax #

## 2014-04-07 NOTE — Telephone Encounter (Signed)
Message forwarded to Hilda Blades, RN to obtain signatures for [facility] orders.

## 2014-04-07 NOTE — Telephone Encounter (Signed)
Danny Frederick was calling in regards to some health orders she faxed over earlier this week. Please call  Thanks

## 2014-04-12 DIAGNOSIS — J309 Allergic rhinitis, unspecified: Secondary | ICD-10-CM | POA: Diagnosis not present

## 2014-04-13 ENCOUNTER — Telehealth: Payer: Self-pay | Admitting: Cardiology

## 2014-04-13 DIAGNOSIS — J309 Allergic rhinitis, unspecified: Secondary | ICD-10-CM | POA: Diagnosis not present

## 2014-04-13 NOTE — Telephone Encounter (Signed)
error 

## 2014-04-17 DIAGNOSIS — M47817 Spondylosis without myelopathy or radiculopathy, lumbosacral region: Secondary | ICD-10-CM | POA: Diagnosis not present

## 2014-04-17 DIAGNOSIS — Z6825 Body mass index (BMI) 25.0-25.9, adult: Secondary | ICD-10-CM | POA: Diagnosis not present

## 2014-04-18 ENCOUNTER — Ambulatory Visit (INDEPENDENT_AMBULATORY_CARE_PROVIDER_SITE_OTHER): Payer: Medicare Other | Admitting: Pharmacist

## 2014-04-18 DIAGNOSIS — I2699 Other pulmonary embolism without acute cor pulmonale: Secondary | ICD-10-CM

## 2014-04-18 LAB — POCT INR: INR: 1.3

## 2014-04-19 DIAGNOSIS — J309 Allergic rhinitis, unspecified: Secondary | ICD-10-CM | POA: Diagnosis not present

## 2014-04-25 DIAGNOSIS — J309 Allergic rhinitis, unspecified: Secondary | ICD-10-CM | POA: Diagnosis not present

## 2014-04-27 ENCOUNTER — Ambulatory Visit (INDEPENDENT_AMBULATORY_CARE_PROVIDER_SITE_OTHER): Payer: Medicare Other | Admitting: *Deleted

## 2014-04-27 DIAGNOSIS — I2699 Other pulmonary embolism without acute cor pulmonale: Secondary | ICD-10-CM

## 2014-04-27 DIAGNOSIS — J309 Allergic rhinitis, unspecified: Secondary | ICD-10-CM | POA: Diagnosis not present

## 2014-04-27 LAB — POCT INR: INR: 1.4

## 2014-04-27 MED ORDER — WARFARIN SODIUM 5 MG PO TABS: ORAL_TABLET | ORAL | Status: DC

## 2014-05-01 ENCOUNTER — Ambulatory Visit (INDEPENDENT_AMBULATORY_CARE_PROVIDER_SITE_OTHER): Payer: Medicare Other | Admitting: Thoracic Surgery (Cardiothoracic Vascular Surgery)

## 2014-05-01 ENCOUNTER — Encounter: Payer: Self-pay | Admitting: Thoracic Surgery (Cardiothoracic Vascular Surgery)

## 2014-05-01 ENCOUNTER — Other Ambulatory Visit: Payer: Self-pay | Admitting: *Deleted

## 2014-05-01 VITALS — BP 157/90 | HR 79 | Resp 20 | Ht 63.0 in | Wt 126.0 lb

## 2014-05-01 DIAGNOSIS — Z09 Encounter for follow-up examination after completed treatment for conditions other than malignant neoplasm: Secondary | ICD-10-CM

## 2014-05-01 DIAGNOSIS — I711 Thoracic aortic aneurysm, ruptured, unspecified: Secondary | ICD-10-CM

## 2014-05-01 DIAGNOSIS — I2699 Other pulmonary embolism without acute cor pulmonale: Secondary | ICD-10-CM | POA: Diagnosis not present

## 2014-05-01 DIAGNOSIS — I6350 Cerebral infarction due to unspecified occlusion or stenosis of unspecified cerebral artery: Secondary | ICD-10-CM

## 2014-05-01 NOTE — Patient Instructions (Signed)
Schedule follow up CTA as soon as practical  As long as CTA looks good then stop taking coumadin (Warfarin) and resume taking aspirin 81 mg/day  Endocarditis is a potentially serious infection of heart valves or inside lining of the heart.  It occurs more commonly in patients with diseased heart valves (such as patient's with aortic or mitral valve disease) and in patients who have undergone heart valve repair or replacement.  Certain surgical and dental procedures may put you at risk, such as dental cleaning, other dental procedures, or any surgery involving the respiratory, urinary, gastrointestinal tract, gallbladder or prostate gland.   To minimize your chances for develooping endocarditis, maintain good oral health and seek prompt medical attention for any infections involving the mouth, teeth, gums, skin or urinary tract.  Always notify your doctor or dentist about your underlying heart valve condition before having any invasive procedures. You will need to take antibiotics before certain procedures.

## 2014-05-01 NOTE — Progress Notes (Signed)
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 The patient presented with smouldering symptoms consistent with bacterioal endocarditis, and cultures obtained intraoperatively grew STREPTOCOCCUS PNEUMONIAE. He was seen in consultation post-operatively by the Infectious Disease team and has been treated with intravenous ceftriaxone. His postoperative convalescence has been remarkably uncomplicated with exception of acute exacerbation of major depression. He has been anticoagulated with Coumadin because of the presence of thrombus within the main pulmonary artery noted at the time of presentation that appeared to be related to direct compression and involvement of the false aneurysm was pulmonary artery itself. He was last seen in our office on 03/06/2014.  Since then he has been seen in followup by Carlyle Basques at the Infectious Disease clinic, Richardson Dopp at [facility] and his primary care physician, Dr. Rita Ohara.  He has done remarkably well over the circumstances. He is exercising routinely he reports that his exercise tolerance continues to improve. He now get short of breath only going up a flight of stairs or walking up an incline. He has never had any significant pain in his chest. He has not had any fevers or chills. He spirits are remarkably good and he remains apologetic for how much trouble he had with acute depression at the time of his illness 3 months ago. He has not had any particular complications related to Coumadin therapy, although he states that he has never had it stabilize.     Current Outpatient Prescriptions  Medication Sig Dispense Refill  .  amoxicillin (AMOXIL) 500 MG tablet Take 1 tablet (500 mg total) by mouth 2 (two) times daily.  90 tablet  3  . aspirin 81 MG tablet Take 81 mg by mouth at bedtime.       . citalopram (CELEXA) 20 MG tablet Take 20 mg by mouth daily.       . Ferrous Sulfate (IRON) 325 (65 FE) MG TABS Take 1 tablet by mouth every other day.      . levothyroxine (SYNTHROID, LEVOTHROID) 50 MCG tablet Take 50 mcg by mouth daily before breakfast.      . lisinopril (PRINIVIL,ZESTRIL) 5 MG tablet Take 1 tablet (5 mg total) by mouth daily.  30 tablet  11  . metoprolol tartrate (LOPRESSOR) 50 MG tablet Take 1 tablet (50 mg total) by mouth 2 (two) times daily.  60 tablet  11  . omeprazole (PRILOSEC) 20 MG capsule Take 20 mg by mouth every other day.      . ondansetron (ZOFRAN) 4 MG tablet Take 4 mg by mouth as needed.       . rifampin (RIFADIN) 300 MG capsule Take 1 capsule (300 mg total) by mouth daily. ANTIBIOTIC  30 capsule  2  . warfarin (COUMADIN) 5 MG tablet Take as directed by Anticoagulation clinic.  70 tablet  1   No current facility-administered medications for this visit.      Physical Exam:   BP 157/90  Pulse 79  Resp 20  Ht 5\' 3"  (1.6 m)  Wt 126 lb (57.153 kg)  BMI 22.33 kg/m2  SpO2 97%  General:  Well-appearing  Chest:   Clear  CV:   Regular rate and rhythm without murmur  Incisions:  Well-healed, sternum is stable  Abdomen:  Soft and nontender  Extremities:  Warm and well-perfused  Diagnostic Tests:  n/a   Impression:  Patient has made remarkable progress now 3 months following emergency straight graft repair of contained rupture of the infected false aneurysm (mycotic aneurysm) of the ascending thoracic aorta.   Plan:  We will obtain followup CT angiogram of the chest to obtain a new baseline and to make sure there is no sign of residual clot in the pulmonary artery. Assuming this is the case and I think it would be reasonable to stop Coumadin and resume daily aspirin. I agree with  plans to continue long-term oral antibiotic therapy. The patient may continue to increase his physical activity without any particular limitations at this time. Assuming that no particular issues are raised by his followup CT angiogram, we will plan to see the patient back in 9 months for routine followup approximately one year after his original surgery. We will obtain another followup angiogram at that time.  I spent in excess of 15 minutes during the conduct of this office consultation and >50% of this time involved direct face-to-face encounter with the patient for counseling and/or coordination of their care.  Valentina Gu. Roxy Manns, MD 05/01/2014 1:29 PM

## 2014-05-02 DIAGNOSIS — J309 Allergic rhinitis, unspecified: Secondary | ICD-10-CM | POA: Diagnosis not present

## 2014-05-03 ENCOUNTER — Other Ambulatory Visit: Payer: Self-pay | Admitting: *Deleted

## 2014-05-03 DIAGNOSIS — I711 Thoracic aortic aneurysm, ruptured, unspecified: Secondary | ICD-10-CM

## 2014-05-03 DIAGNOSIS — I2699 Other pulmonary embolism without acute cor pulmonale: Secondary | ICD-10-CM

## 2014-05-04 ENCOUNTER — Ambulatory Visit: Payer: Self-pay | Admitting: Cardiology

## 2014-05-04 ENCOUNTER — Telehealth: Payer: Self-pay | Admitting: Thoracic Surgery (Cardiothoracic Vascular Surgery)

## 2014-05-04 ENCOUNTER — Ambulatory Visit
Admission: RE | Admit: 2014-05-04 | Discharge: 2014-05-04 | Disposition: A | Discharge status: Home / Self Care | Payer: Medicare Other | Source: Ambulatory Visit | Attending: Thoracic Surgery (Cardiothoracic Vascular Surgery) | Admitting: Thoracic Surgery (Cardiothoracic Vascular Surgery)

## 2014-05-04 DIAGNOSIS — J90 Pleural effusion, not elsewhere classified: Secondary | ICD-10-CM | POA: Diagnosis not present

## 2014-05-04 DIAGNOSIS — I2699 Other pulmonary embolism without acute cor pulmonale: Secondary | ICD-10-CM

## 2014-05-04 DIAGNOSIS — I711 Thoracic aortic aneurysm, ruptured, unspecified: Secondary | ICD-10-CM

## 2014-05-04 DIAGNOSIS — I712 Thoracic aortic aneurysm, without rupture, unspecified: Secondary | ICD-10-CM | POA: Diagnosis not present

## 2014-05-04 DIAGNOSIS — J309 Allergic rhinitis, unspecified: Secondary | ICD-10-CM | POA: Diagnosis not present

## 2014-05-04 DIAGNOSIS — R911 Solitary pulmonary nodule: Secondary | ICD-10-CM | POA: Diagnosis not present

## 2014-05-04 MED ORDER — IOHEXOL 350 MG/ML SOLN
100.0000 mL | Freq: Once | INTRAVENOUS | Status: AC | PRN
  Administered 2014-05-04: 100 mL via INTRAVENOUS

## 2014-05-04 NOTE — Telephone Encounter (Signed)
Patient's follow up CT angiogram reviewed and results called over the telephone.   There is no residual clot in the pulmonary artery.  I have recommended stopping coumadin.  All questions answered.  [name],[name] 05/04/2014 4:03 PM

## 2014-05-05 ENCOUNTER — Encounter: Payer: Self-pay | Admitting: Family Medicine

## 2014-05-05 ENCOUNTER — Other Ambulatory Visit: Payer: Self-pay | Admitting: Family Medicine

## 2014-05-05 MED ORDER — TRIAMCINOLONE ACETONIDE 0.1 % EX CREA: 1.0000 "application " | TOPICAL_CREAM | Freq: Two times a day (BID) | CUTANEOUS | Status: DC

## 2014-05-06 DIAGNOSIS — T7840XA Allergy, unspecified, initial encounter: Secondary | ICD-10-CM | POA: Diagnosis not present

## 2014-05-07 DIAGNOSIS — T7840XA Allergy, unspecified, initial encounter: Secondary | ICD-10-CM | POA: Diagnosis not present

## 2014-05-10 ENCOUNTER — Ambulatory Visit: Payer: Self-pay | Admitting: Internal Medicine

## 2014-05-10 DIAGNOSIS — J309 Allergic rhinitis, unspecified: Secondary | ICD-10-CM | POA: Diagnosis not present

## 2014-05-15 ENCOUNTER — Ambulatory Visit (INDEPENDENT_AMBULATORY_CARE_PROVIDER_SITE_OTHER): Payer: Medicare Other | Admitting: Internal Medicine

## 2014-05-15 ENCOUNTER — Encounter: Payer: Self-pay | Admitting: Internal Medicine

## 2014-05-15 VITALS — BP 155/87 | HR 64 | Temp 97.8°F | Ht 63.0 in | Wt 125.0 lb

## 2014-05-15 DIAGNOSIS — I6350 Cerebral infarction due to unspecified occlusion or stenosis of unspecified cerebral artery: Secondary | ICD-10-CM

## 2014-05-15 DIAGNOSIS — I711 Thoracic aortic aneurysm, ruptured, unspecified: Secondary | ICD-10-CM | POA: Diagnosis not present

## 2014-05-15 DIAGNOSIS — A491 Streptococcal infection, unspecified site: Secondary | ICD-10-CM | POA: Diagnosis not present

## 2014-05-15 DIAGNOSIS — M6281 Muscle weakness (generalized): Secondary | ICD-10-CM | POA: Diagnosis not present

## 2014-05-15 LAB — CBC WITH DIFFERENTIAL/PLATELET
BASOS PCT: 0 % (ref 0–1)
Basophils Absolute: 0 10*3/uL (ref 0.0–0.1)
EOS ABS: 0.1 10*3/uL (ref 0.0–0.7)
Eosinophils Relative: 2 % (ref 0–5)
HEMATOCRIT: 43.2 % (ref 39.0–52.0)
HEMOGLOBIN: 14.5 g/dL (ref 13.0–17.0)
Lymphocytes Relative: 20 % (ref 12–46)
Lymphs Abs: 1.5 10*3/uL (ref 0.7–4.0)
MCH: 27.7 pg (ref 26.0–34.0)
MCHC: 33.6 g/dL (ref 30.0–36.0)
MCV: 82.6 fL (ref 78.0–100.0)
MONO ABS: 0.9 10*3/uL (ref 0.1–1.0)
Monocytes Relative: 13 % — ABNORMAL HIGH (ref 3–12)
Neutro Abs: 4.7 10*3/uL (ref 1.7–7.7)
Neutrophils Relative %: 65 % (ref 43–77)
Platelets: 347 10*3/uL (ref 150–400)
RBC: 5.23 MIL/uL (ref 4.22–5.81)
RDW: 16.3 % — ABNORMAL HIGH (ref 11.5–15.5)
WBC: 7.3 10*3/uL (ref 4.0–10.5)

## 2014-05-15 LAB — COMPLETE METABOLIC PANEL WITH GFR
ALBUMIN: 3.9 g/dL (ref 3.5–5.2)
ALK PHOS: 78 U/L (ref 39–117)
ALT: 13 U/L (ref 0–53)
AST: 19 U/L (ref 0–37)
BILIRUBIN TOTAL: 0.5 mg/dL (ref 0.2–1.2)
BUN: 20 mg/dL (ref 6–23)
CO2: 30 mEq/L (ref 19–32)
Calcium: 9.9 mg/dL (ref 8.4–10.5)
Chloride: 98 mEq/L (ref 96–112)
Creat: 0.85 mg/dL (ref 0.50–1.35)
GFR, EST NON AFRICAN AMERICAN: 83 mL/min
GLUCOSE: 53 mg/dL — AB (ref 70–99)
POTASSIUM: 4.7 meq/L (ref 3.5–5.3)
Sodium: 134 mEq/L — ABNORMAL LOW (ref 135–145)
Total Protein: 7.6 g/dL (ref 6.0–8.3)

## 2014-05-15 LAB — C-REACTIVE PROTEIN: CRP: <0.5 mg/dL (ref <0.60)

## 2014-05-15 LAB — SEDIMENTATION RATE: Sed Rate: 11 mm/hr (ref 0–16)

## 2014-05-17 DIAGNOSIS — J309 Allergic rhinitis, unspecified: Secondary | ICD-10-CM | POA: Diagnosis not present

## 2014-05-22 ENCOUNTER — Encounter: Payer: Self-pay | Admitting: Family Medicine

## 2014-05-23 ENCOUNTER — Encounter: Payer: Self-pay | Admitting: Family Medicine

## 2014-05-23 ENCOUNTER — Ambulatory Visit (INDEPENDENT_AMBULATORY_CARE_PROVIDER_SITE_OTHER): Payer: Medicare Other | Admitting: Family Medicine

## 2014-05-23 VITALS — BP 150/84 | HR 64 | Ht 63.0 in | Wt 128.0 lb

## 2014-05-23 DIAGNOSIS — I10 Essential (primary) hypertension: Secondary | ICD-10-CM | POA: Diagnosis not present

## 2014-05-23 DIAGNOSIS — Z23 Encounter for immunization: Secondary | ICD-10-CM | POA: Diagnosis not present

## 2014-05-23 DIAGNOSIS — R000 Tachycardia, unspecified: Secondary | ICD-10-CM | POA: Diagnosis not present

## 2014-05-23 DIAGNOSIS — F325 Major depressive disorder, single episode, in full remission: Secondary | ICD-10-CM

## 2014-05-23 DIAGNOSIS — I6350 Cerebral infarction due to unspecified occlusion or stenosis of unspecified cerebral artery: Secondary | ICD-10-CM | POA: Diagnosis not present

## 2014-05-23 DIAGNOSIS — IMO0002 Reserved for concepts with insufficient information to code with codable children: Secondary | ICD-10-CM

## 2014-05-23 DIAGNOSIS — M48061 Spinal stenosis, lumbar region without neurogenic claudication: Secondary | ICD-10-CM | POA: Diagnosis not present

## 2014-05-23 DIAGNOSIS — R351 Nocturia: Secondary | ICD-10-CM

## 2014-05-23 MED ORDER — METOPROLOL TARTRATE 50 MG PO TABS: 50.0000 mg | ORAL_TABLET | Freq: Two times a day (BID) | ORAL | Status: DC

## 2014-05-23 MED ORDER — LISINOPRIL 10 MG PO TABS: 10.0000 mg | ORAL_TABLET | Freq: Every day | ORAL | Status: DC

## 2014-05-23 NOTE — Patient Instructions (Signed)
  Increase your lisinopril from 5mg  to 10mg  (you can take two of the 5mg  tablets, and new prescription will sent for 10mg ).  Continue to try and follow a low sodium diet.  Return in 1 month for recheck of blood pressure--will check labwork at that time (chem panel). Schedule annual wellness visit.  If you develop any recurrent itching (before you develop the full-blown rash), start using either zyrtec, or benadryl.  If that doesn't help, we can refill the hydroxyzine, however if significant rash develops, please schedule office visit for evaluation  Wife to schedule office visit for f/u on depression as well as address memory concerns (mini-mental status testing).

## 2014-05-23 NOTE — Progress Notes (Signed)
Chief Complaint  Patient presents with  . Advice Only    interested in stopping his citalopram.    "I have no idea whether I need to be on citalopram or not". He has questions about how long he needs to take it.  It was started prior to knowing what was wrong with him (when he had no energy, couldn't go to the gym, and got depressed), and then he had severe depression post-operatively (while in hospital, seen by psych).  Moods have been good since then.  He denies any side effects to the medications.  Review of chart shows that the last few visits (infectious disease and Dr. Roxy Manns), his BP has been elevated.  He brings in list of BP's checked at home, and they are ranging  129-155/74-92. He gets to the gym a few days/week, and walks on the other days.  Wife is having some memory issues that he is concerned about.  He tells me of a recent problem that he had, requiring urgent care visit.  He had started feeling itchy about 10-14 days ago, and 10 days ago he noticed rash, red splotches.  He went to urgent care, who thought it was a reaction to the antibiotics.  He was treated with hydroxyzine and prednisone, which cleared up the rash very quickly.  A test was done (IgE Ab to PCN V and rifampin) which showed that he didn't have allergy to his antibiotics.  He is followed by Dr. Baxter Flattery in Infectious Disease.  He was not given an end-date, just that she would prefer to keep him on it for longer, as long as it isn't causing him any problems.  He hasn't had diarrhea, or any side effects, no fevers or there problems.  Past Medical History  Diagnosis Date  . Hypertension   . Unspecified hypothyroidism   . Mitral valve prolapse     h/o; normal echo 12/2011 with no MVP seen  . Foot drop, right   . BPH (benign prostatic hypertrophy)   . Iron deficiency anemia, unspecified 6/09  . Hearing loss in left ear   . Diverticulosis of colon 12/09  . Internal hemorrhoids   . GERD (gastroesophageal reflux  disease)   . Allergic rhinitis, cause unspecified     on allergy shots (Dr. Orvil Feil)  . Allergic conjunctivitis   . Ruptured thoracic aortic aneurysm 01/23/2014  . 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 Right ventricular dysfunction 01/23/2014    Secondary to obstruction of main pulmonary artery  . Hx of echocardiogram     Echo (03/2014):  Mild LVH, EF 50-55%, no RWMA, Gr 1 DD, mild MR, mild reduced RVSF   Past Surgical History  Procedure Laterality Date  . Prostate surgery  2007    photovaporization  . Cervical laminectomy  1972    C5-6  . Spine surgery  2006    L4-5 disk surgery  . Rotator cuff repair  10/2008    left; Dr. Onnie Graham  . Esophagogastroduodenoscopy  10/31/08    normal; Dr. Edison Nasuti  . Tonsillectomy    . Hemorroidal banding  04/07/2013    x3-Dr.Eric Redmond Pulling  . Hemorrhoidectomy with hemorrhoid banding  04/07/13  . Spine surgery  04/2013    L4-5, L5-S1 fusion.  Dr. Christella Noa  . Thoracic aortic aneurysm repair N/A 01/23/2014    Procedure: THORACIC ASCENDING ANEURYSM REPAIR (AAA);  Surgeon: Rexene Alberts, MD;  Location: [facility];  Service: Open Heart Surgery;  Laterality: N/A;   History   Social History  .  Marital Status: Married    Spouse Name: N/A    Number of Children: 2  . Years of Education: N/A   Occupational History  . Not on file.   Social History Main Topics  . Smoking status: Former Smoker    Types: Cigarettes    Quit date: 09/01/1977  . Smokeless tobacco: Never Used  . Alcohol Use: Yes     Comment: 1-2 glasses wine per day  . Drug Use: No  . Sexual Activity: Not on file   Other Topics Concern  . Not on file   Social History Narrative   Lives with wife, pet rats (new)   Outpatient Encounter Prescriptions as of 05/23/2014  Medication Sig Note  . amoxicillin (AMOXIL) 500 MG tablet Take 500 mg by mouth 2 (two) times daily.   Marland Kitchen aspirin 81 MG tablet Take 81 mg by mouth at bedtime.    . citalopram (CELEXA) 20 MG tablet Take 20 mg by mouth daily.    . Ferrous  Sulfate (IRON) 325 (65 FE) MG TABS Take 1 tablet by mouth every other day.   . levothyroxine (SYNTHROID, LEVOTHROID) 50 MCG tablet Take 50 mcg by mouth daily before breakfast.   . lisinopril (PRINIVIL,ZESTRIL) 5 MG tablet Take 1 tablet (5 mg total) by mouth daily.   . metoprolol tartrate (LOPRESSOR) 50 MG tablet Take 1 tablet (50 mg total) by mouth 2 (two) times daily.   Marland Kitchen omeprazole (PRILOSEC) 20 MG capsule Take 20 mg by mouth every other day.   . rifampin (RIFADIN) 300 MG capsule Take 1 capsule (300 mg total) by mouth daily. ANTIBIOTIC   . triamcinolone cream (KENALOG) 0.1 % Apply 1 application topically 2 (two) times daily.   . [DISCONTINUED] amoxicillin (AMOXIL) 500 MG tablet Take 1 tablet (500 mg total) by mouth 2 (two) times daily.   . [DISCONTINUED] hydrOXYzine (VISTARIL) 25 MG capsule  05/15/2014: Received from: External Pharmacy  . [DISCONTINUED] ondansetron (ZOFRAN) 4 MG tablet Take 4 mg by mouth as needed.    . [DISCONTINUED] predniSONE (DELTASONE) 20 MG tablet  05/15/2014: Received from: External Pharmacy   He has been off of warfarin for a few weeks now.  No Known Allergies  ROS:  No fevers, chills, headaches, dizziness, chest pain, shortness of breath.  No bleeding/bruising.  Occasional cough/postnasal drainage. No fevers, chills.  His stomach makes loud gurgles sometimes, but no diarrhea. He is getting up 3-4x/night to void--wondering if his prostate procedure is no longer helping him; denies urinary frequency during the day, denies any dysuria or abdominal discomfort.  PHYSICAL EXAM: BP 150/84  Pulse 64  Ht _0  (1.6 m)  Wt 128 lb (58.06 kg)  BMI 22.68 kg/m2 Well developed, pleasant, elderly male in no distress HEENT:  PERRL, EOMI, conjunctiva clear Neck: no lymphadenopathy, thyromegaly or mass Heart: regular rate and rhythm Lungs:  Trace crackles at left base. Good air movement, no wheezes, rales, ronchi Abdomen: soft, nontender, active bowel sounds, no  masses Extremities: no edema, 2+ pulses Psych: normal mood, affect, hygiene and grooming; normal eye contact, speech.  ASSESSMENT/PLAN:  Depression, major, in remission - discussed plan of continuing for at least 6-12 months, and then to cut to 1/2 tablet for 1-2 months to look for recurrent sx at lower dose.  Continue 32m now  Need for prophylactic vaccination and inoculation against influenza - Plan: Flu vaccine HIGH DOSE PF (Fluzone Tri High dose)  Tachycardia - Plan: metoprolol (LOPRESSOR) 50 MG tablet  Essential hypertension, benign - suboptimally  controlled.  increase lisinopril to 47m.  return in 1 month for f/u BP and b-met - Plan: lisinopril (PRINIVIL,ZESTRIL) 10 MG tablet, metoprolol (LOPRESSOR) 50 MG tablet  Nocturia - suspect due to recurrent BPH.  Schedule AWV/physical for prostate exam   Nocturia--consider f/u with urology.  eval at [facility]   Increase your lisinopril from 547mto 1044myou can take two of the 5mg73mblets, and new prescription will sent for 10mg60m  Return in 1 month for recheck of blood pressure--will check labwork at that time (chem panel). Schedule annual wellness visit.  If you develop any recurrent itching (before you develop the full-blown rash), start using either zyrtec, or benadryl.  If that doesn't help, we can refill the hydroxyzine, however if significant rash develops, please schedule office visit for evaluation  Wife to schedule office visit for f/u on depression as well as address memory concerns (mini-mental status testing)  30 min visit, more than 1/2 spent counseling.

## 2014-05-24 DIAGNOSIS — J309 Allergic rhinitis, unspecified: Secondary | ICD-10-CM | POA: Diagnosis not present

## 2014-05-25 ENCOUNTER — Other Ambulatory Visit: Payer: Self-pay | Admitting: Licensed Clinical Social Worker

## 2014-05-29 NOTE — Progress Notes (Signed)
   Subjective:    Patient ID: Danny Frederick, male    DOB: December 13, 1933, 80 y.o.   MRN: [id number]  HPI 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 Blood CX grew strep pneumoniae and thought this was related to bacterial endocarditis. He was treated with 6 wks of  intravenous ceftriaxone and oral rifampin. He was swithced to orals but recently had developed a rash for which he was treated with steroids and improved. He has been anticoagulated with Coumadin because of the presence of thrombus within the main pulmonary artery noted at the time of presentation. He has now completed the course ofcoumdin  He is exercising routinely he reports that his exercise tolerance continues to improve. He now get short of breath only going up a flight of stairs or walking up an incline. He has never had any significant pain in his chest. He has not had any fevers or chills.  Current Outpatient Prescriptions on File Prior to Visit  Medication Sig Dispense Refill  . aspirin 81 MG tablet Take 81 mg by mouth at bedtime.       . citalopram (CELEXA) 20 MG tablet Take 20 mg by mouth daily.       . Ferrous Sulfate (IRON) 325 (65 FE) MG TABS Take 1 tablet by mouth every other day.      . levothyroxine (SYNTHROID, LEVOTHROID) 50 MCG tablet Take 50 mcg by mouth daily before breakfast.      . omeprazole (PRILOSEC) 20 MG capsule Take 20 mg by mouth every other day.      . rifampin (RIFADIN) 300 MG capsule Take 1 capsule (300 mg total) by mouth daily. ANTIBIOTIC  30 capsule  2  . triamcinolone cream (KENALOG) 0.1 % Apply 1 application topically 2 (two) times daily.  454 g  0   No current facility-administered medications on file prior to visit.     Review of Systems 10 point ros is negative    Objective:   Physical Exam BP 155/87  Pulse 64  Temp(Src) 97.8 F (36.6 C) (Oral)  Ht 5'  3" (1.6 m)  Wt 125 lb (56.7 kg)  BMI 22.15 kg/m2 Physical Exam  Constitutional: He is oriented to person, place, and time. He appears well-developed and well-nourished. No distress.  HENT:  Mouth/Throat: Oropharynx is clear and moist. No oropharyngeal exudate.  Cardiovascular: Normal rate, regular rhythm and normal heart sounds. Exam reveals no gallop and no friction rub.  No murmur heard.  Pulmonary/Chest: Effort normal and breath sounds normal. No respiratory distress. No wheezing Neurological: He is alert and oriented to person, place, and time.  Skin: Skin is warm and dry. No rash noted. No erythema. Surgical incision site well healed Psychiatric: He has a normal mood and affect. His behavior is normal.    Lab Results  Component Value Date   ESRSEDRATE 11 05/15/2014   Lab Results  Component Value Date   CRP <0.5 05/15/2014         Assessment & Plan:  Strep pneumonia sbe, = continue with oral antibiotic regimen, amoxicillin and rifampin for now. If he has recurrent rash, we will decide to change to different agent.

## 2014-05-31 ENCOUNTER — Other Ambulatory Visit: Payer: Self-pay | Admitting: Family Medicine

## 2014-05-31 DIAGNOSIS — J309 Allergic rhinitis, unspecified: Secondary | ICD-10-CM | POA: Diagnosis not present

## 2014-05-31 NOTE — Telephone Encounter (Signed)
Ok to refill x90 only.  He is due for TSH in October (last result I could find was from 06/2013), and will do at his f/u visit (along with chem)

## 2014-05-31 NOTE — Telephone Encounter (Signed)
Is this okay to fill (synthroid) for 6 more months or will you be checking again at his visit next month? (last note said that you would do chem panel). Please advise, thanks.

## 2014-06-07 DIAGNOSIS — J3089 Other allergic rhinitis: Secondary | ICD-10-CM | POA: Diagnosis not present

## 2014-06-07 DIAGNOSIS — J301 Allergic rhinitis due to pollen: Secondary | ICD-10-CM | POA: Diagnosis not present

## 2014-06-12 ENCOUNTER — Ambulatory Visit (INDEPENDENT_AMBULATORY_CARE_PROVIDER_SITE_OTHER): Payer: Medicare Other | Admitting: Internal Medicine

## 2014-06-12 ENCOUNTER — Encounter: Payer: Self-pay | Admitting: Internal Medicine

## 2014-06-12 VITALS — BP 167/94 | HR 66 | Temp 98.1°F | Wt 130.0 lb

## 2014-06-12 DIAGNOSIS — A491 Streptococcal infection, unspecified site: Secondary | ICD-10-CM

## 2014-06-12 DIAGNOSIS — I39 Endocarditis and heart valve disorders in diseases classified elsewhere: Secondary | ICD-10-CM

## 2014-06-12 DIAGNOSIS — I339 Acute and subacute endocarditis, unspecified: Secondary | ICD-10-CM

## 2014-06-12 DIAGNOSIS — I639 Cerebral infarction, unspecified: Secondary | ICD-10-CM | POA: Diagnosis not present

## 2014-06-12 NOTE — Progress Notes (Signed)
Subjective:    Patient ID: Danny Frederick, male    DOB: Feb 03, 1934, 80 y.o.   MRN: [id number]  HPI 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 Presentation thought to be related to  endocarditis, and cultures obtained intraoperatively grew STREPTOCOCCUS PNEUMONIAE. He was treated with 6 wks of  intravenous ceftriaxone plus rifampin. He has been on oral regimen of amoxicillin with rifampin for the past 3.5 months. He is tolerating medications well with exception to having increased reflux. He takes ppi every other day so that it does not react with iron in addition to taking calcium/tums. He is slowly getting back to his normal exercise endurance. He walks 2 miles every other day and goes to the gym twice a week. He still feels he is not quite back to his baseline energy. He is finding that he needs to take naps which is new for him.  No Known Allergies Current Outpatient Prescriptions on File Prior to Visit  Medication Sig Dispense Refill  . amoxicillin (AMOXIL) 500 MG tablet Take 500 mg by mouth 2 (two) times daily.      Marland Kitchen aspirin 81 MG tablet Take 81 mg by mouth at bedtime.       . citalopram (CELEXA) 20 MG tablet Take 20 mg by mouth daily.       . Ferrous Sulfate (IRON) 325 (65 FE) MG TABS Take 1 tablet by mouth every other day.      . levothyroxine (SYNTHROID, LEVOTHROID) 50 MCG tablet Take 50 mcg by mouth daily before breakfast.      . lisinopril (PRINIVIL,ZESTRIL) 10 MG tablet Take 1 tablet (10 mg total) by mouth daily.  90 tablet  1  . metoprolol (LOPRESSOR) 50 MG tablet Take 1 tablet (50 mg total) by mouth 2 (two) times daily.  180 tablet  1  . omeprazole (PRILOSEC) 20 MG capsule Take 20 mg by mouth every other day.      Marland Kitchen omeprazole (PRILOSEC) 20 MG capsule Take 1 capsule by mouth  every other day  45 capsule  0  . rifampin (RIFADIN) 300 MG capsule Take 1  capsule (300 mg total) by mouth daily. ANTIBIOTIC  30 capsule  2  . SYNTHROID 50 MCG tablet Take 1 tablet by mouth  daily before breakfast  90 tablet  0  . triamcinolone cream (KENALOG) 0.1 % Apply 1 application topically 2 (two) times daily.  454 g  0   No current facility-administered medications on file prior to visit.   Active Ambulatory Problems    Diagnosis Date Noted  . IRON DEFICIENCY ANEMIA SECONDARY TO BLOOD LOSS 11/08/2008  . ANEMIA 05/26/2008  . HYPERTENSION 03/02/2008  . DYSPNEA 03/02/2008  . Unspecified hypothyroidism 01/22/2011  . Essential hypertension, benign 01/22/2011  . GERD (gastroesophageal reflux disease) 01/22/2011  . Iron deficiency anemia, unspecified 01/22/2011  . Syncope 01/22/2012  . Allergic conjunctivitis 10/20/2012  . Solitary pulmonary nodule 12/08/2013  . Ruptured thoracic aortic aneurysm 01/23/2014  . 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 Right ventricular dysfunction 01/23/2014  . Bacteremia   . Depression, major, in remission 03/30/2014   Resolved Ambulatory Problems    Diagnosis Date Noted  . Internal hemorrhoids 09/23/2012  . Pulmonary embolus 01/23/2014   Past Medical History  Diagnosis Date  . Hypertension   . Mitral valve prolapse   . Foot drop, right   .  BPH (benign prostatic hypertrophy)   . Hearing loss in left ear   . Diverticulosis of colon 12/09  . Allergic rhinitis, cause unspecified   . Hx of echocardiogram      Review of Systems Positive pertinents listed in hpi. No fever, chills, sob, doe    Objective:   Physical Exam BP 167/94  Pulse 66  Temp(Src) 98.1 F (36.7 C) (Oral)  Wt 130 lb (58.968 kg) gen = a xo by 3 in nad Skin = chest incision is well healed     Assessment & Plan:  - finish up on rifampin in next 10 days ,but continue with amoxicillin chronic suppression for addn month. Aim to have 6 months of treatment  Situation depression = he is planning to address with pcp next month to consider start  taper off.

## 2014-06-14 ENCOUNTER — Other Ambulatory Visit: Payer: Self-pay | Admitting: Family Medicine

## 2014-06-14 ENCOUNTER — Other Ambulatory Visit: Payer: Self-pay | Admitting: *Deleted

## 2014-06-14 DIAGNOSIS — J301 Allergic rhinitis due to pollen: Secondary | ICD-10-CM | POA: Diagnosis not present

## 2014-06-14 DIAGNOSIS — J3089 Other allergic rhinitis: Secondary | ICD-10-CM | POA: Diagnosis not present

## 2014-06-14 MED ORDER — CITALOPRAM HYDROBROMIDE 20 MG PO TABS: 20.0000 mg | ORAL_TABLET | Freq: Every day | ORAL | Status: DC

## 2014-06-19 ENCOUNTER — Encounter: Payer: Self-pay | Admitting: Cardiology

## 2014-06-19 ENCOUNTER — Ambulatory Visit (INDEPENDENT_AMBULATORY_CARE_PROVIDER_SITE_OTHER): Payer: Medicare Other | Admitting: Cardiology

## 2014-06-19 VITALS — BP 158/72 | HR 62 | Ht 63.0 in | Wt 132.1 lb

## 2014-06-19 DIAGNOSIS — I2699 Other pulmonary embolism without acute cor pulmonale: Secondary | ICD-10-CM | POA: Diagnosis not present

## 2014-06-19 DIAGNOSIS — I10 Essential (primary) hypertension: Secondary | ICD-10-CM | POA: Diagnosis not present

## 2014-06-19 DIAGNOSIS — I711 Thoracic aortic aneurysm, ruptured, unspecified: Secondary | ICD-10-CM

## 2014-06-19 DIAGNOSIS — I639 Cerebral infarction, unspecified: Secondary | ICD-10-CM

## 2014-06-19 MED ORDER — LISINOPRIL 20 MG PO TABS: 20.0000 mg | ORAL_TABLET | Freq: Every day | ORAL | Status: DC

## 2014-06-19 NOTE — Patient Instructions (Signed)
Your physician wants you to follow-up in: [place] will receive a reminder letter in the mail two months in advance. If you don't receive a letter, please call our office to schedule the follow-up appointment.   INCREASE LISINOPRIL TO 20 MG ONCE DAILY  Your physician recommends that you return for lab work in: [place]  Non-Cardiac CT scanning, (CAT scanning), is a noninvasive, special x-ray that produces cross-sectional images of the body using x-rays and a computer. CT scans help physicians diagnose and treat medical conditions. For some CT exams, a contrast material is used to enhance visibility in the area of the body being studied. CT scans provide greater clarity and reveal more details than regular x-ray exams.DUE IN MARCH 2016

## 2014-06-19 NOTE — Progress Notes (Signed)
HPI: FU TAA repair; also with micturition syncope, HTN, hypothyroidism. Admitted 12/2013 with a contained, ruptured ascending thoracic aortic aneurysm in the setting of streptococcal pneumoniae bacteremia/aortitis. He underwent repair of the ascending thoracic aortic aneurysm with a 32 mm Hemashield straight graft repair. He was also followed by infectious disease and treated with antibiotics. The patient was noted to have clot in the main pulmonary artery. This was likely related to compression of the pulmonary artery by the false aneurysm. He was started on Coumadin. Echocardiogram 7/15 showed normal LV function, mild MR and mild RVE. CTA 9/15 showed no PE, surgical repair of ascending aorta and LUL nodule. Since last seen, the patient denies any dyspnea on exertion, orthopnea, PND, pedal edema, palpitations, syncope or chest pain.    Current Outpatient Prescriptions  Medication Sig Dispense Refill  . amoxicillin (AMOXIL) 500 MG tablet Take 500 mg by mouth 2 (two) times daily.      Marland Kitchen aspirin 81 MG tablet Take 81 mg by mouth at bedtime.       . citalopram (CELEXA) 20 MG tablet Take 1 tablet (20 mg total) by mouth daily.  30 tablet  2  . Ferrous Sulfate (IRON) 325 (65 FE) MG TABS Take 1 tablet by mouth every other day.      . lisinopril (PRINIVIL,ZESTRIL) 10 MG tablet Take 1 tablet (10 mg total) by mouth daily.  90 tablet  1  . metoprolol (LOPRESSOR) 50 MG tablet Take 1 tablet (50 mg total) by mouth 2 (two) times daily.  180 tablet  1  . omeprazole (PRILOSEC) 20 MG capsule Take 1 capsule by mouth  every other day  45 capsule  0  . rifampin (RIFADIN) 300 MG capsule Take 1 capsule (300 mg total) by mouth daily. ANTIBIOTIC  30 capsule  2  . SYNTHROID 50 MCG tablet Take 1 tablet by mouth  daily before breakfast  90 tablet  0  . triamcinolone cream (KENALOG) 0.1 % Apply 1 application topically 2 (two) times daily.  454 g  0   No current facility-administered medications for this visit.      Past Medical History  Diagnosis Date  . Hypertension   . Unspecified hypothyroidism   . Mitral valve prolapse     h/o; normal echo 12/2011 with no MVP seen  . Foot drop, right   . BPH (benign prostatic hypertrophy)   . Iron deficiency anemia, unspecified 6/09  . Hearing loss in left ear   . Diverticulosis of colon 12/09  . Internal hemorrhoids   . GERD (gastroesophageal reflux disease)   . Allergic rhinitis, cause unspecified     on allergy shots (Dr. Orvil Feil)  . Allergic conjunctivitis   . Ruptured thoracic aortic aneurysm 01/23/2014  . 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 Right ventricular dysfunction 01/23/2014    Secondary to obstruction of main pulmonary artery  . Hx of echocardiogram     Echo (03/2014):  Mild LVH, EF 50-55%, no RWMA, Gr 1 DD, mild MR, mild reduced RVSF    Past Surgical History  Procedure Laterality Date  . Prostate surgery  2007    photovaporization  . Cervical laminectomy  1972    C5-6  . Spine surgery  2006    L4-5 disk surgery  . Rotator cuff repair  10/2008    left; Dr. Onnie Graham  . Esophagogastroduodenoscopy  10/31/08    normal; Dr. Edison Nasuti  . Tonsillectomy    . Hemorroidal banding  04/07/2013  x3-Dr.Eric Redmond Pulling  . Hemorrhoidectomy with hemorrhoid banding  04/07/13  . Spine surgery  04/2013    L4-5, L5-S1 fusion.  Dr. Christella Noa  . Thoracic aortic aneurysm repair N/A 01/23/2014    Procedure: THORACIC ASCENDING ANEURYSM REPAIR (AAA);  Surgeon: Rexene Alberts, MD;  Location: [facility];  Service: Open Heart Surgery;  Laterality: N/A;    History   Social History  . Marital Status: Married    Spouse Name: N/A    Number of Children: 2  . Years of Education: N/A   Occupational History  . Not on file.   Social History Main Topics  . Smoking status: Former Smoker    Types: Cigarettes    Quit date: 09/01/1977  . Smokeless tobacco: Never Used  . Alcohol Use: Yes     Comment: 1-2 glasses wine per day  . Drug Use: No  . Sexual Activity: Not on  file   Other Topics Concern  . Not on file   Social History Narrative   Lives with wife, pet rats (new)    ROS: no fevers or chills, productive cough, hemoptysis, dysphasia, odynophagia, melena, hematochezia, dysuria, hematuria, rash, seizure activity, orthopnea, PND, pedal edema, claudication. Remaining systems are negative.  Physical Exam: Well-developed well-nourished in no acute distress.  Skin is warm and dry.  HEENT is normal.  Neck is supple.  Chest is clear to auscultation with normal expansion. Previous sternotomy Cardiovascular exam is regular rate and rhythm.  Abdominal exam nontender or distended. No masses palpated. Extremities show no edema. neuro grossly intact  ECG Sinus rhythm at a rate of 62. Left atrial enlargement. Left axis deviation. Inferior lateral T-wave inversion.

## 2014-06-19 NOTE — Assessment & Plan Note (Signed)
Recent CT notes left upper lobe nodule. Plan repeat noncontrast chest CT March 2016.

## 2014-06-19 NOTE — Assessment & Plan Note (Signed)
Status post repair doing well. He is on continuing antibiotics which is managed by infectious disease.

## 2014-06-19 NOTE — Assessment & Plan Note (Signed)
Blood pressure elevated. Increase lisinopril to 20 mg daily. Check potassium and renal function in one week.

## 2014-06-19 NOTE — Assessment & Plan Note (Signed)
Patient is now off of Coumadin. Followup CT did not show thrombus.

## 2014-06-21 ENCOUNTER — Ambulatory Visit (INDEPENDENT_AMBULATORY_CARE_PROVIDER_SITE_OTHER): Payer: Medicare Other | Admitting: Family Medicine

## 2014-06-21 ENCOUNTER — Encounter: Payer: Self-pay | Admitting: Family Medicine

## 2014-06-21 VITALS — BP 140/82 | HR 80 | Ht 64.0 in | Wt 134.0 lb

## 2014-06-21 DIAGNOSIS — I639 Cerebral infarction, unspecified: Secondary | ICD-10-CM | POA: Diagnosis not present

## 2014-06-21 DIAGNOSIS — F324 Major depressive disorder, single episode, in partial remission: Secondary | ICD-10-CM

## 2014-06-21 DIAGNOSIS — J301 Allergic rhinitis due to pollen: Secondary | ICD-10-CM | POA: Diagnosis not present

## 2014-06-21 DIAGNOSIS — J3089 Other allergic rhinitis: Secondary | ICD-10-CM | POA: Diagnosis not present

## 2014-06-21 DIAGNOSIS — E039 Hypothyroidism, unspecified: Secondary | ICD-10-CM

## 2014-06-21 DIAGNOSIS — I10 Essential (primary) hypertension: Secondary | ICD-10-CM

## 2014-06-21 DIAGNOSIS — F325 Major depressive disorder, single episode, in full remission: Secondary | ICD-10-CM

## 2014-06-21 NOTE — Patient Instructions (Signed)
Continue your current medication (20mg  dose--use up the 10mg , and new prescription for 20mg  had been sent to mail order by your cardiologist). Get labs as scheduled next week. We will contact you with thyroid results from today.  See you in January, sooner if your blood pressures are consistently over 140/90 at home.

## 2014-06-21 NOTE — Progress Notes (Signed)
Chief Complaint  Patient presents with  . follow-up    follow-up on bp. no other concerns   He presents to f/u on hypertension and depression. His lisinopril dose was increased by me at his last visit a month ago, up to 10mg . Today was supposed to be 1 month f/u, and to recheck chem panel. He hadn't noticed any improvement in BP when dose was increased from 5 to 10mg . He saw Dr. Stanford Breed on 10/19 and lisinopril dose was increased to 20mg /day.  He is scheduled to have potassium and renal function rechecked next week.  He brings in list of BP's from 10/7 through 10/18, ranging from 132-174/83-99, pulse 60-80.    He checked his BP just prior to coming here 174/88--this was just after walking upstairs.  LUL nodule noted on recent CT. He is going to have repeat CT in 10/2014 to follow up.  He last saw Infectious Disease 10/12.  He was finishing up on Rifampin, but going to continue with amoxicillin for a 6 month course.    He has started having more frequent bowel movements, 4/day, all clustered in the morning.  Denies loose/diarrhea, no blood or mucus in the stool.  Denies abdominal pain or bloating.  Energy has been up and down.  Cut down going to the gym to twice/week (runs out of energy when he was going 5x/week).  Sometimes feels a little tired during the day, lays down like he wants to sleep, but never actually falls asleep.  He has hypothyroidism and is due for TSH.  He is compliant with taking meds.  Denies skin changes; notices some hair loss on his arms (is bald on his head).  Weight is stable.  PMH/SH/SOC were reviewed and updated Outpatient Encounter Prescriptions as of 06/21/2014  Medication Sig  . aspirin 81 MG tablet Take 81 mg by mouth at bedtime.   . Ferrous Sulfate (IRON) 325 (65 FE) MG TABS Take 1 tablet by mouth every other day.  . lisinopril (PRINIVIL,ZESTRIL) 20 MG tablet Take 1 tablet (20 mg total) by mouth daily.  . metoprolol (LOPRESSOR) 50 MG tablet Take 1 tablet (50 mg  total) by mouth 2 (two) times daily.  Marland Kitchen omeprazole (PRILOSEC) 20 MG capsule Take 1 capsule by mouth  every other day  . rifampin (RIFADIN) 300 MG capsule Take 1 capsule (300 mg total) by mouth daily. ANTIBIOTIC  . SYNTHROID 50 MCG tablet Take 1 tablet by mouth  daily before breakfast  . triamcinolone cream (KENALOG) 0.1 % Apply 1 application topically 2 (two) times daily.  . [DISCONTINUED] amoxicillin (AMOXIL) 500 MG tablet Take 500 mg by mouth 2 (two) times daily.  . citalopram (CELEXA) 20 MG tablet Take 1 tablet (20 mg total) by mouth daily.   No Known Allergies  ROS:  No weight changes, fevers, chills, headaches, URI symptoms, chest pain, shortness of breath, GI or GU complaints, bleeding, bruising, edema, depression.  See HPI.  PHYSICAL EXAM: BP 140/82  Pulse 80  Ht 5\' 4"  (1.626 m)  Wt 134 lb (60.782 kg)  BMI 22.99 kg/m2 156/86 on repeat by MD, RA Neck: no lymphadenopathy, thyromegaly or mass.  No bruit Heart: regular rate and rhythm without murmur Lungs: clear bilaterally Extremities: no edema Psych: normal mood, affect, hygiene and grooming Neuro: alert and oriented.  Cranial nerves intact, normal strength, gait.  ASSESSMENT/PLAN:  Essential hypertension, benign - continue monitoring at home; chem next week via cardiology as planned.  continue 20mg  dose  Hypothyroidism, unspecified hypothyroidism type -  due for labs today - Plan: TSH  Depression, major, in remission - doing well.  continue citalopram, as reviewed at last visit   Hypothyroidism--last checked 6 months ago.  Some bowel changes.  Recheck today.  HTN--dose recently increased by cardiologist.  Therefore hold off on chem test until next week (as scheduled through cardiologist).  Depression--controlled.  Continue medication, as discussed at prior visit.  Pulmonary nodule--agree with plan for fu CT in March  F/u as scheduled in January, sooner prn if BP's remain high.

## 2014-06-22 LAB — TSH: TSH: 3.392 u[IU]/mL (ref 0.350–4.500)

## 2014-06-27 DIAGNOSIS — J301 Allergic rhinitis due to pollen: Secondary | ICD-10-CM | POA: Diagnosis not present

## 2014-06-27 DIAGNOSIS — J3089 Other allergic rhinitis: Secondary | ICD-10-CM | POA: Diagnosis not present

## 2014-06-27 DIAGNOSIS — I711 Thoracic aortic aneurysm, ruptured: Secondary | ICD-10-CM | POA: Diagnosis not present

## 2014-06-27 DIAGNOSIS — I10 Essential (primary) hypertension: Secondary | ICD-10-CM | POA: Diagnosis not present

## 2014-06-27 LAB — BASIC METABOLIC PANEL WITH GFR
BUN: 15 mg/dL (ref 6–23)
CALCIUM: 9.2 mg/dL (ref 8.4–10.5)
CO2: 27 mEq/L (ref 19–32)
CREATININE: 0.99 mg/dL (ref 0.50–1.35)
Chloride: 97 mEq/L (ref 96–112)
GFR, EST AFRICAN AMERICAN: 84 mL/min
GFR, Est Non African American: 73 mL/min
GLUCOSE: 59 mg/dL — AB (ref 70–99)
Potassium: 4.5 mEq/L (ref 3.5–5.3)
Sodium: 132 mEq/L — ABNORMAL LOW (ref 135–145)

## 2014-06-29 ENCOUNTER — Encounter: Payer: Self-pay | Admitting: Cardiology

## 2014-06-29 NOTE — Telephone Encounter (Signed)
This encounter was created in error - please disregard.

## 2014-06-29 NOTE — Telephone Encounter (Signed)
Returning your call. °

## 2014-07-05 DIAGNOSIS — J301 Allergic rhinitis due to pollen: Secondary | ICD-10-CM | POA: Diagnosis not present

## 2014-07-11 DIAGNOSIS — H1045 Other chronic allergic conjunctivitis: Secondary | ICD-10-CM | POA: Diagnosis not present

## 2014-07-11 DIAGNOSIS — J3089 Other allergic rhinitis: Secondary | ICD-10-CM | POA: Diagnosis not present

## 2014-07-11 DIAGNOSIS — J301 Allergic rhinitis due to pollen: Secondary | ICD-10-CM | POA: Diagnosis not present

## 2014-07-19 DIAGNOSIS — J3089 Other allergic rhinitis: Secondary | ICD-10-CM | POA: Diagnosis not present

## 2014-07-19 DIAGNOSIS — J301 Allergic rhinitis due to pollen: Secondary | ICD-10-CM | POA: Diagnosis not present

## 2014-07-20 ENCOUNTER — Ambulatory Visit (INDEPENDENT_AMBULATORY_CARE_PROVIDER_SITE_OTHER): Payer: Medicare Other | Admitting: Internal Medicine

## 2014-07-20 ENCOUNTER — Encounter: Payer: Self-pay | Admitting: Internal Medicine

## 2014-07-20 VITALS — BP 177/96 | HR 59 | Temp 97.7°F | Wt 135.0 lb

## 2014-07-20 DIAGNOSIS — I339 Acute and subacute endocarditis, unspecified: Secondary | ICD-10-CM

## 2014-07-20 DIAGNOSIS — A491 Streptococcal infection, unspecified site: Secondary | ICD-10-CM | POA: Diagnosis not present

## 2014-07-20 DIAGNOSIS — I39 Endocarditis and heart valve disorders in diseases classified elsewhere: Secondary | ICD-10-CM

## 2014-07-20 DIAGNOSIS — I639 Cerebral infarction, unspecified: Secondary | ICD-10-CM

## 2014-07-20 NOTE — Progress Notes (Signed)
Subjective:    Patient ID: Danny Frederick, male    DOB: September 16, 1933, 80 y.o.   MRN: [id number]  HPI [name] is a 80 yo M who underwent emergent repair of contained rupture of mycotic false aneurysm of the ascending aorta on 01/24/2014 using a Hemashield straight graft. He presnted with sepsis due to streptococcus pneumonaie endocarditis, since intraoperatively cx grew STREPTOCOCCUS PNEUMONIAE. He was treated with 6 wks of intravenous ceftriaxone plus rifampin. He has been on oral regimen of amoxicillin for now 6 months and rifampin 4 months (stopped rifampin due to intolerability). He is doing well except fro having hypertension. He exercises 2-3 x per week. Has gained back all the weight now back to his baseline. No fever, chills, shortness of breath with exertion  Current Outpatient Prescriptions on File Prior to Visit  Medication Sig Dispense Refill  . aspirin 81 MG tablet Take 81 mg by mouth at bedtime.     . citalopram (CELEXA) 20 MG tablet Take 1 tablet (20 mg total) by mouth daily. 30 tablet 2  . Ferrous Sulfate (IRON) 325 (65 FE) MG TABS Take 1 tablet by mouth every other day.    . lisinopril (PRINIVIL,ZESTRIL) 20 MG tablet Take 1 tablet (20 mg total) by mouth daily. 90 tablet 3  . metoprolol (LOPRESSOR) 50 MG tablet Take 1 tablet (50 mg total) by mouth 2 (two) times daily. 180 tablet 1  . omeprazole (PRILOSEC) 20 MG capsule Take 1 capsule by mouth  every other day 45 capsule 0  . rifampin (RIFADIN) 300 MG capsule Take 1 capsule (300 mg total) by mouth daily. ANTIBIOTIC 30 capsule 2  . SYNTHROID 50 MCG tablet Take 1 tablet by mouth  daily before breakfast 90 tablet 0  . triamcinolone cream (KENALOG) 0.1 % Apply 1 application topically 2 (two) times daily. 454 g 0   No current facility-administered medications on file prior to visit.   Active Ambulatory Problems    Diagnosis Date Noted  . IRON DEFICIENCY ANEMIA SECONDARY TO BLOOD LOSS 11/08/2008  . ANEMIA 05/26/2008  .  HYPERTENSION 03/02/2008  . DYSPNEA 03/02/2008  . Hypothyroidism 01/22/2011  . Essential hypertension, benign 01/22/2011  . GERD (gastroesophageal reflux disease) 01/22/2011  . Iron deficiency anemia, unspecified 01/22/2011  . Syncope 01/22/2012  . Allergic conjunctivitis 10/20/2012  . Solitary pulmonary nodule 12/08/2013  . Ruptured thoracic aortic aneurysm 01/23/2014  . 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 Right ventricular dysfunction 01/23/2014  . Bacteremia   . Depression, major, in remission 03/30/2014   Resolved Ambulatory Problems    Diagnosis Date Noted  . Internal hemorrhoids 09/23/2012  . Pulmonary embolus 01/23/2014   Past Medical History  Diagnosis Date  . Hypertension   . Unspecified hypothyroidism   . Mitral valve prolapse   . Foot drop, right   . BPH (benign prostatic hypertrophy)   . Hearing loss in left ear   . Diverticulosis of colon 12/09  . Allergic rhinitis, cause unspecified   . Hx of echocardiogram       Review of Systems     Objective:   Physical Exam  BP 177/96 mmHg  Pulse 59  Temp(Src) 97.7 F (36.5 C) (Oral)  Wt 135 lb (61.236 kg)  no exam      Assessment & Plan:  Strep pneumonaie endocarditis with ruptured thoracic aortic aneurysm requiring emergent repair treated with antibiotics for the last 6 months. Patient states that he is somewhat fatigued in taking medicaitons. The exact guidelines for chronic suppression in  these types of cases are not well defined, recommend to longer length of care in resistant organism. Strep pneumonia is sensitive but his graft was implanted during active infection. Will see recommend for lifetime suppression and see how he does at the 12 month mark.  Danny Frederick for Infectious Diseases [phone]

## 2014-07-25 DIAGNOSIS — J301 Allergic rhinitis due to pollen: Secondary | ICD-10-CM | POA: Diagnosis not present

## 2014-07-25 DIAGNOSIS — J3089 Other allergic rhinitis: Secondary | ICD-10-CM | POA: Diagnosis not present

## 2014-08-01 DIAGNOSIS — J3089 Other allergic rhinitis: Secondary | ICD-10-CM | POA: Diagnosis not present

## 2014-08-01 DIAGNOSIS — J301 Allergic rhinitis due to pollen: Secondary | ICD-10-CM | POA: Diagnosis not present

## 2014-08-09 ENCOUNTER — Encounter: Payer: Self-pay | Admitting: Family Medicine

## 2014-08-09 ENCOUNTER — Ambulatory Visit (INDEPENDENT_AMBULATORY_CARE_PROVIDER_SITE_OTHER): Payer: Medicare Other | Admitting: Family Medicine

## 2014-08-09 VITALS — BP 130/78 | HR 60 | Temp 97.8°F | Ht 64.0 in | Wt 134.0 lb

## 2014-08-09 DIAGNOSIS — I639 Cerebral infarction, unspecified: Secondary | ICD-10-CM

## 2014-08-09 DIAGNOSIS — R197 Diarrhea, unspecified: Secondary | ICD-10-CM

## 2014-08-09 DIAGNOSIS — I10 Essential (primary) hypertension: Secondary | ICD-10-CM | POA: Diagnosis not present

## 2014-08-09 DIAGNOSIS — E162 Hypoglycemia, unspecified: Secondary | ICD-10-CM | POA: Diagnosis not present

## 2014-08-09 MED ORDER — OMEPRAZOLE 20 MG PO CPDR: DELAYED_RELEASE_CAPSULE | ORAL | Status: DC

## 2014-08-09 NOTE — Patient Instructions (Addendum)
Avoid dairy in your diet for about a week. Continue probiotics. Return if you develop blood in your stool, abdominal pain, black stools (unrelated to Pepto Bismol--your dark stool was after taking this medication) or if your diarrhea persists.  Being on antibiotics can increase your risk for a Clostridium Difficile infection, and if it persists, we need to collect a sample for stool studies.  Diarrhea Diarrhea is frequent loose and watery bowel movements. It can cause you to feel weak and dehydrated. Dehydration can cause you to become tired and thirsty, have a dry mouth, and have decreased urination that often is dark yellow. Diarrhea is a sign of another problem, most often an infection that will not last long. In most cases, diarrhea typically lasts 2-3 days. However, it can last longer if it is a sign of something more serious. It is important to treat your diarrhea as directed by your caregiver to lessen or prevent future episodes of diarrhea. CAUSES  Some common causes include:  Gastrointestinal infections caused by viruses, bacteria, or parasites.  Food poisoning or food allergies.  Certain medicines, such as antibiotics, chemotherapy, and laxatives.  Artificial sweeteners and fructose.  Digestive disorders. HOME CARE INSTRUCTIONS  Ensure adequate fluid intake (hydration): Have 1 cup (8 oz) of fluid for each diarrhea episode. Avoid fluids that contain simple sugars or sports drinks, fruit juices, whole milk products, and sodas. Your urine should be clear or pale yellow if you are drinking enough fluids. Hydrate with an oral rehydration solution that you can purchase at pharmacies, retail stores, and online. You can prepare an oral rehydration solution at home by mixing the following ingredients together:   - tsp table salt.   tsp baking soda.   tsp salt substitute containing potassium chloride.  1  tablespoons sugar.  1 L (34 oz) of water.  Certain foods and beverages may  increase the speed at which food moves through the gastrointestinal (GI) tract. These foods and beverages should be avoided and include:  Caffeinated and alcoholic beverages.  High-fiber foods, such as raw fruits and vegetables, nuts, seeds, and whole grain breads and cereals.  Foods and beverages sweetened with sugar alcohols, such as xylitol, sorbitol, and mannitol.  Some foods may be well tolerated and may help thicken stool including:  Starchy foods, such as rice, toast, pasta, low-sugar cereal, oatmeal, grits, baked potatoes, crackers, and bagels.  Bananas.  Applesauce.  Add probiotic-rich foods to help increase healthy bacteria in the GI tract, such as yogurt and fermented milk products.  Wash your hands well after each diarrhea episode.  Only take over-the-counter or prescription medicines as directed by your caregiver.  Take a warm bath to relieve any burning or pain from frequent diarrhea episodes. SEEK IMMEDIATE MEDICAL CARE IF:   You are unable to keep fluids down.  You have persistent vomiting.  You have blood in your stool, or your stools are black and tarry.  You do not urinate in 6-8 hours, or there is only a small amount of very dark urine.  You have abdominal pain that increases or localizes.  You have weakness, dizziness, confusion, or light-headedness.  You have a severe headache.  Your diarrhea gets worse or does not get better.  You have a fever or persistent symptoms for more than 2-3 days.  You have a fever and your symptoms suddenly get worse. MAKE SURE YOU:   Understand these instructions.  Will watch your condition.  Will get help right away if you are not  doing well or get worse. Document Released: 08/08/2002 Document Revised: 01/02/2014 Document Reviewed: 04/25/2012 [facility] Patient Information 2015 [place], [place]. This information is not intended to replace advice given to you by your health care provider. Make sure you discuss any  questions you have with your health care provider.  Hypoglycemia Hypoglycemia occurs when the glucose in your blood is too low. Glucose is a type of sugar that is your body's main energy source. Hormones, such as insulin and glucagon, control the level of glucose in the blood. Insulin lowers blood glucose and glucagon increases blood glucose. Having too much insulin in your blood stream, or not eating enough food containing sugar, can result in hypoglycemia. Hypoglycemia can happen to people with or without diabetes. It can develop quickly and can be a medical emergency.  CAUSES   Missing or delaying meals.  Not eating enough carbohydrates at meals.  Taking too much diabetes medicine.  Not timing your oral diabetes medicine or insulin doses with meals, snacks, and exercise.  Nausea and vomiting.  Certain medicines.  Severe illnesses, such as hepatitis, kidney disorders, and certain eating disorders.  Increased activity or exercise without eating something extra or adjusting medicines.  Drinking too much alcohol.  A nerve disorder that affects body functions like your heart rate, blood pressure, and digestion (autonomic neuropathy).  A condition where the stomach muscles do not function properly (gastroparesis). Therefore, medicines and food may not absorb properly.  Rarely, a tumor of the pancreas can produce too much insulin. SYMPTOMS   Hunger.  Sweating (diaphoresis).  Change in body temperature.  Shakiness.  Headache.  Anxiety.  Lightheadedness.  Irritability.  Difficulty concentrating.  Dry mouth.  Tingling or numbness in the hands or feet.  Restless sleep or sleep disturbances.  Altered speech and coordination.  Change in mental status.  Seizures or prolonged convulsions.  Combativeness.  Drowsiness (lethargic).  Weakness.  Increased heart rate or palpitations.  Confusion.  Pale, gray skin color.  Blurred or double  vision.  Fainting. DIAGNOSIS  A physical exam and medical history will be performed. Your caregiver may make a diagnosis based on your symptoms. Blood tests and other lab tests may be performed to confirm a diagnosis. Once the diagnosis is made, your caregiver will see if your signs and symptoms go away once your blood glucose is raised.  TREATMENT  Usually, you can easily treat your hypoglycemia when you notice symptoms.  Check your blood glucose. If it is less than 70 mg/dl, take one of the following:   3-4 glucose tablets.    cup juice.    cup regular soda.   1 cup skim milk.   -1 tube of glucose gel.   5-6 hard candies.   Avoid high-fat drinks or food that may delay a rise in blood glucose levels.  Do not take more than the recommended amount of sugary foods, drinks, gel, or tablets. Doing so will cause your blood glucose to go too high.   Wait 10-15 minutes and recheck your blood glucose. If it is still less than 70 mg/dl or below your target range, repeat treatment.   Eat a snack if it is more than 1 hour until your next meal.  There may be a time when your blood glucose may go so low that you are unable to treat yourself at home when you start to notice symptoms. You may need someone to help you. You may even faint or be unable to swallow. If you cannot treat yourself,  someone will need to bring you to the hospital.  [place]  If you have diabetes, follow your diabetes management plan by:  Taking your medicines as directed.  Following your exercise plan.  Following your meal plan. Do not skip meals. Eat on time.  Testing your blood glucose regularly. Check your blood glucose before and after exercise. If you exercise longer or different than usual, be sure to check blood glucose more frequently.  Wearing your medical alert jewelry that says you have diabetes.  Identify the cause of your hypoglycemia. Then, develop ways to prevent the  recurrence of hypoglycemia.  Do not take a hot bath or shower right after an insulin shot.  Always carry treatment with you. Glucose tablets are the easiest to carry.  If you are going to drink alcohol, drink it only with meals.  Tell friends or family members ways to keep you safe during a seizure. This may include removing hard or sharp objects from the area or turning you on your side.  Maintain a healthy weight. SEEK MEDICAL CARE IF:   You are having problems keeping your blood glucose in your target range.  You are having frequent episodes of hypoglycemia.  You feel you might be having side effects from your medicines.  You are not sure why your blood glucose is dropping so low.  You notice a change in vision or a new problem with your vision. SEEK IMMEDIATE MEDICAL CARE IF:   Confusion develops.  A change in mental status occurs.  The inability to swallow develops.  Fainting occurs. Document Released: 08/18/2005 Document Revised: 08/23/2013 Document Reviewed: 12/15/2011 [facility] Patient Information 2015 [place], [place]. This information is not intended to replace advice given to you by your health care provider. Make sure you discuss any questions you have with your health care provider.

## 2014-08-09 NOTE — Progress Notes (Signed)
Chief Complaint  Patient presents with  . Diarrhea    x 3 days, no vomiting. Needs refill on omeprazole today.   Diarrhea started 3 days ago.  Having about 6 episodes daily, mostly watery.  No blood in the stool, noted stools to be "nearly black" after taking Pepto Bismol.  Denies any abdominal pain.  He comes in today because it isn't resolving.  He has continued to have dairy in his diet.  He denies nausea, vomiting. Appetite is normal.    He got hot/sweaty once last night.  While walking up the stairs he felt a little wobbly, and felt like he might faint.  He lay down, started sweating, and then felt better.  He thought he might have been dehydrated.  His urine is normal, not dark, and has been drinking fluids.  He denies any sick contacts.  Denies travel, undercooked meats, camping.  He stopped the amoxicillin about 1 week ago (having completed a 6 month course of antibiotics).  He has been taking a probiotic for about a month.  Glucose 60, 53 and 59 in the last few months by Dr. Stanford Breed.  He can't recall if he was fasting, thinks he wasn't.  Denies hypoglycemic symptoms.    Hypertension:  He states that Dr. Stanford Breed doubled the lisinopril.  BP's remain somewhat elevated.  BP's at home have been running 126-167/79-94, mainly 140's-150's. Pulse 56-64   PMH, PSH, SH, reviewed.  Outpatient Encounter Prescriptions as of 08/09/2014  Medication Sig  . aspirin 81 MG tablet Take 81 mg by mouth at bedtime.   . citalopram (CELEXA) 20 MG tablet Take 1 tablet (20 mg total) by mouth daily.  . Ferrous Sulfate (IRON) 325 (65 FE) MG TABS Take 1 tablet by mouth every other day.  . lisinopril (PRINIVIL,ZESTRIL) 20 MG tablet Take 1 tablet (20 mg total) by mouth daily.  . metoprolol (LOPRESSOR) 50 MG tablet Take 1 tablet (50 mg total) by mouth 2 (two) times daily.  Marland Kitchen omeprazole (PRILOSEC) 20 MG capsule Take 1 capsule by mouth  every other day  . rifampin (RIFADIN) 300 MG capsule Take 1 capsule (300 mg  total) by mouth daily. ANTIBIOTIC  . SYNTHROID 50 MCG tablet Take 1 tablet by mouth  daily before breakfast  . [DISCONTINUED] omeprazole (PRILOSEC) 20 MG capsule Take 1 capsule by mouth  every other day  . triamcinolone cream (KENALOG) 0.1 % Apply 1 application topically 2 (two) times daily. (Patient not taking: Reported on 08/09/2014)   He used Pepto Bismol once with current illness  No Known Allergies  ROS: no fevers, chills, nausea, vomiting.  Appetite has been good.  No headaches, chest pain, edema, bleeding, bruising, rash. He changed to decaffeinated tea, and urinary frequency at night is much improved, only going once at night. Sometimes his chest feels a little tight--not related to exertion or exercise.  Not related to eating, denies heartburn.  It is not constant, intermittent, and usually mild.  PHYSICAL EXAM: BP 130/78 mmHg  Pulse 60  Ht 5\' 4"  (1.626 m)  Wt 134 lb (60.782 kg)  BMI 22.99 kg/m2  Pleasant, elderly male in no distress.  In good spirits HEENT: conjunctiva and sclera are clear.  Mucus membranes are moist Neck: no lymphadenopathy, thyromegaly or mass Heart: regular rate and rhythm Lungs: clear bilaterally Abdomen: soft, normal bowel sounds. Nontender, no organomegaly or mass Extremities: No edema Skin: no rash Psych: normal mood, affect, hygiene and grooming Neuro: alert and oriented.  Cranial nerves intact. Normal  gait  ASSESSMENT/PLAN:  Diarrhea - acute, mild.  at risk for C.diff.  return for stool studies if not resolving.  no red flags today.  BRAT diet, avoid dairy  Hypoglycemia - incidental finding on labs done by cardiologist.  asymptomatic.  Frequent meals/snack, avoid concentrated sweets; high protein diet  Essential hypertension - normal in office;  running a little high at home   [employer]. Avoid dairy for a week. Continue probiotics.  If diarrhea persists, likely will need stool studies, including C.diff testing, given recent longterm  antibiotics. Avoid anti-diarrheals  Hypoglycemia--reviewed symptoms.   Frequent snacks, avoid concentrated sweets.  HTN--well controlled today, above average at home. Continue low sodium diet.

## 2014-08-14 ENCOUNTER — Telehealth: Payer: Self-pay | Admitting: Family Medicine

## 2014-08-14 NOTE — Telephone Encounter (Signed)
[name] texted me and advised that he was having continued watery diarrhea.  (also admitted that he had calzone for dinner the night before). Advised to avoid dairy, continue probiotic.  Danny Frederick will be stopping by Monday morning to pick up supplies for stool studies

## 2014-08-14 NOTE — Telephone Encounter (Signed)
Supplies and rx put up front for pick up by Ivin Booty.

## 2014-08-15 ENCOUNTER — Other Ambulatory Visit: Payer: Self-pay | Admitting: Family Medicine

## 2014-08-15 DIAGNOSIS — R197 Diarrhea, unspecified: Secondary | ICD-10-CM | POA: Diagnosis not present

## 2014-08-16 ENCOUNTER — Other Ambulatory Visit: Payer: Self-pay | Admitting: Family Medicine

## 2014-08-16 ENCOUNTER — Other Ambulatory Visit: Payer: Self-pay | Admitting: *Deleted

## 2014-08-16 ENCOUNTER — Telehealth: Payer: Self-pay | Admitting: *Deleted

## 2014-08-16 LAB — C. DIFFICILE GDH AND TOXIN A/B
C. difficile GDH: DETECTED — AB
C. difficile Toxin A/B: DETECTED — AB

## 2014-08-16 LAB — OVA AND PARASITE EXAMINATION: OP: NONE SEEN

## 2014-08-16 MED ORDER — METRONIDAZOLE 500 MG PO TABS: 500.0000 mg | ORAL_TABLET | Freq: Three times a day (TID) | ORAL | Status: DC

## 2014-08-16 NOTE — Telephone Encounter (Signed)
Advise--this can be contagious.  Make sure that he washes hands well after using the bathroom. It isn't as contagious as viral stomach bugs.  Let us know if Danny Frederick starts having any diarrhea.  He should continue avoiding dairy and greasy foods until the diarrhea has resolved.  I really can't predict when the diarrhea will improve. Message didn't say when going to the beach.  As long as he has had 48 hours of Flagyl on board, he can take a dose of imodium for the car ride.  Reassure him that this is common, and temporary.  He should be back to normal and eating regular food soon.  He can drink an Ensure shake daily to help with extra calories, if needed/concerned.

## 2014-08-16 NOTE — Telephone Encounter (Signed)
Patient advised.

## 2014-08-16 NOTE — Telephone Encounter (Signed)
Spoke with patient and went over the C Diff results and instructions. Danny Frederick, wife called back and had a few questions. First, can he resume a normal diet once he starts meds or should he continue BRAT diet? Is this catching (to her)? Also, they were planning on driving to the beach (4 hr car ride)-do you think he will still be having diarrhea by then or should the flagyl kick in by then and help? Do you think they should just stay home instead of going? Wanted to let you know that [name] has lost 9 lbs since you saw him and he is very depressed about this situation.

## 2014-08-19 LAB — STOOL CULTURE

## 2014-08-21 ENCOUNTER — Telehealth: Payer: Self-pay | Admitting: Family Medicine

## 2014-08-21 DIAGNOSIS — F325 Major depressive disorder, single episode, in full remission: Secondary | ICD-10-CM

## 2014-08-21 IMAGING — CR DG CHEST 2V
2 series · 2 of 2 positions shown · non-contrast
Comparison: January 31, 2014

CLINICAL DATA: Pulmonary artery thrombosis

EXAM:
CHEST  2 VIEW

[w chest pa]
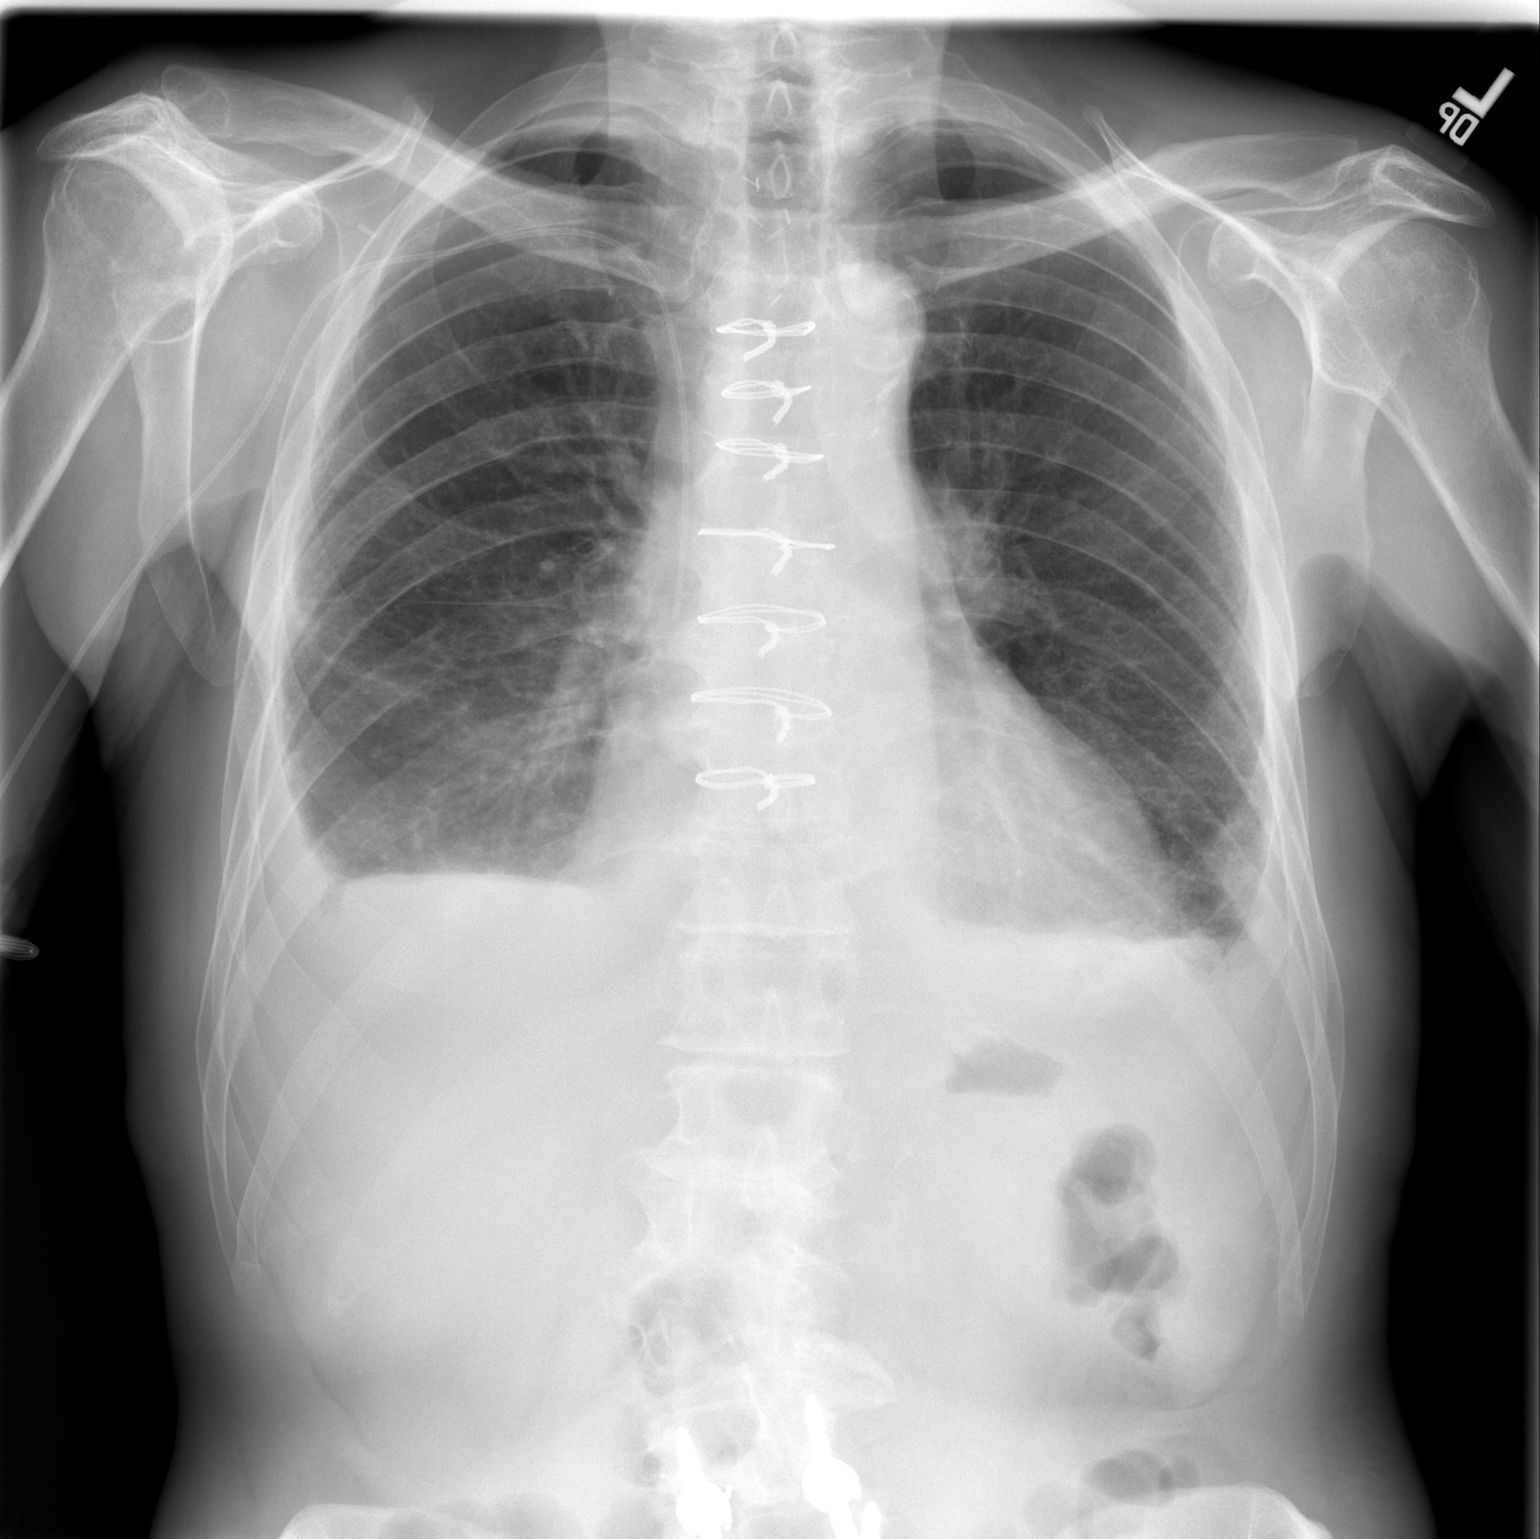

[w chest lat]
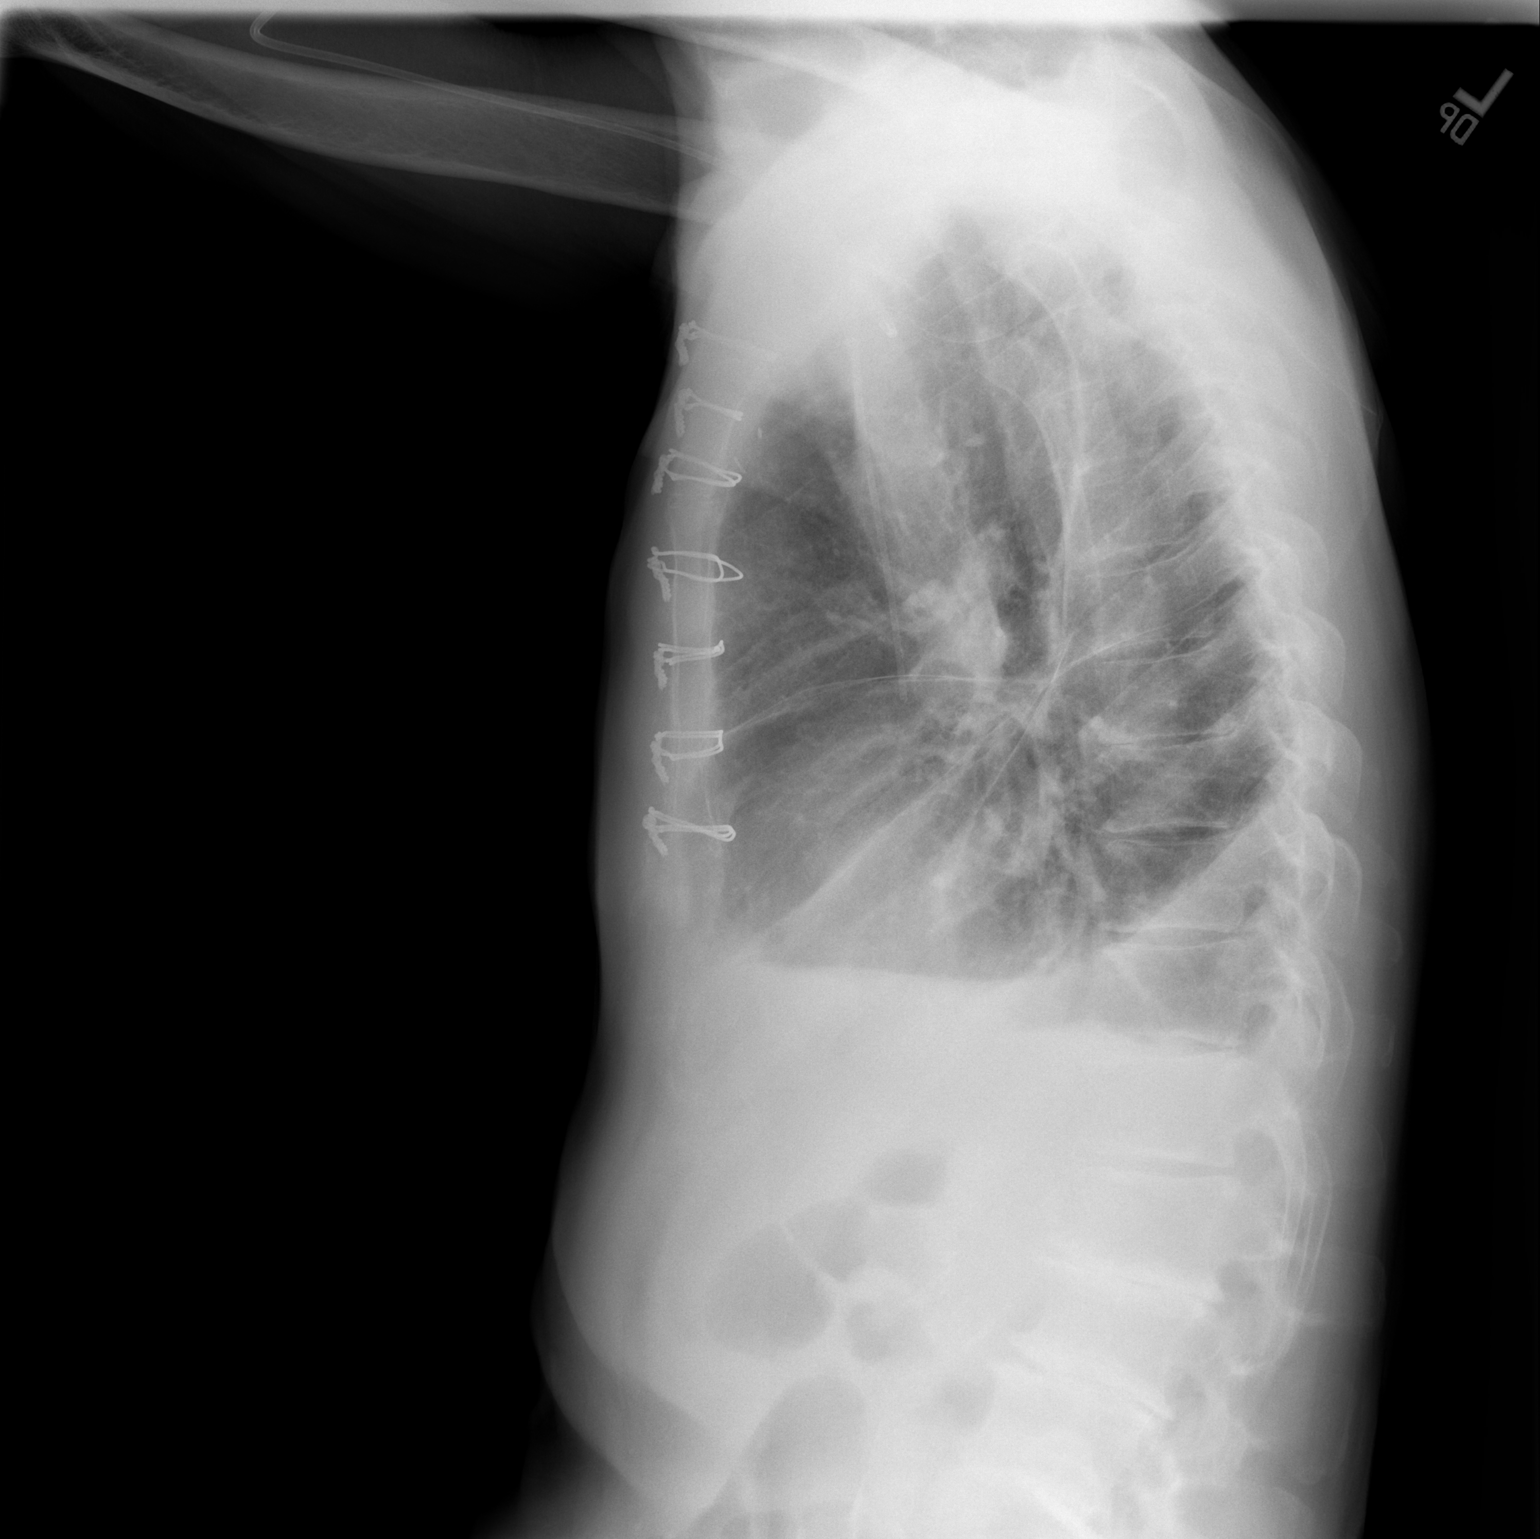

[2 of 2 positions shown; findings below may reference images not displayed]

FINDINGS: There is underlying emphysema. There are small pleural effusions
bilaterally with patchy left base atelectatic change. Elsewhere,
lungs are clear. Heart is upper normal in size. The pulmonary
vascularity reflects underlying emphysema. Central catheter tip is
at the cavoatrial junction. No adenopathy. No pneumothorax. Patient
is status post median sternotomy.
IMPRESSION: Underlying emphysema. Small pleural effusions with left base
atelectasis. No pneumothorax.

## 2014-08-21 MED ORDER — CITALOPRAM HYDROBROMIDE 20 MG PO TABS: 20.0000 mg | ORAL_TABLET | Freq: Every day | ORAL | Status: DC

## 2014-08-21 NOTE — Telephone Encounter (Signed)
done

## 2014-08-30 DIAGNOSIS — J3089 Other allergic rhinitis: Secondary | ICD-10-CM | POA: Diagnosis not present

## 2014-08-30 DIAGNOSIS — J301 Allergic rhinitis due to pollen: Secondary | ICD-10-CM | POA: Diagnosis not present

## 2014-09-06 ENCOUNTER — Other Ambulatory Visit: Payer: Self-pay | Admitting: Internal Medicine

## 2014-09-06 ENCOUNTER — Telehealth: Payer: Self-pay | Admitting: *Deleted

## 2014-09-06 DIAGNOSIS — A0472 Enterocolitis due to Clostridium difficile, not specified as recurrent: Secondary | ICD-10-CM

## 2014-09-06 MED ORDER — SACCHAROMYCES BOULARDII 250 MG PO CAPS: 250.0000 mg | ORAL_CAPSULE | Freq: Two times a day (BID) | ORAL | Status: DC

## 2014-09-06 MED ORDER — VANCOMYCIN 50 MG/ML ORAL SOLUTION: 125.0000 mg | Freq: Four times a day (QID) | ORAL | Status: DC

## 2014-09-06 NOTE — Progress Notes (Signed)
Patient recently diagnosed with cdifficile. He was givne 10 day course of metronidazole by his pcp, Dr. Tomi Bamberger which he finished on 12/31. He states that he had been doing well until today, having recurrent diarrhea. Called for further management.  Will retest, and retreat presumptively for cdifficile with oral vanco plus give florastor.  See back in clinic in 7-10 day to discuss treatment and further options if relapse

## 2014-09-06 NOTE — Telephone Encounter (Signed)
C. Diff infection after IV ABX.  Treated by PCP with Flagyl for 10 days, last dose 08/31/14.   Diarrhea returned 09/01/14.  Asking for help from Dr. Baxter Flattery.  MD please advise.

## 2014-09-08 ENCOUNTER — Telehealth: Payer: Self-pay | Admitting: *Deleted

## 2014-09-08 DIAGNOSIS — J3089 Other allergic rhinitis: Secondary | ICD-10-CM | POA: Diagnosis not present

## 2014-09-08 DIAGNOSIS — J301 Allergic rhinitis due to pollen: Secondary | ICD-10-CM | POA: Diagnosis not present

## 2014-09-08 LAB — CLOSTRIDIUM DIFFICILE BY PCR: CDIFFPCR: DETECTED — AB

## 2014-09-08 NOTE — Telephone Encounter (Signed)
Patient called for results of stool sample; still pending. He said he only had one episode of diarrhea. Advised patient he will be notified once results are in. Myrtis Hopping CMA

## 2014-09-08 NOTE — Telephone Encounter (Signed)
Call from [facility] of positive c-diff on stool culture. Dr. Baxter Flattery notified and patient scheduled appointment for 09/11/14 at 2:30 pm. Patient's daughter Ivin Booty, notified and per Dr. Baxter Flattery patient is not to start the oral vancomycin unless his diarrhea returns. Daughter will relay this information to her father. Myrtis Hopping

## 2014-09-11 ENCOUNTER — Ambulatory Visit: Payer: Self-pay | Admitting: Internal Medicine

## 2014-09-11 ENCOUNTER — Telehealth: Payer: Self-pay | Admitting: *Deleted

## 2014-09-11 IMAGING — CR DG CHEST 2V
2 series · 2 of 2 positions shown · non-contrast
Comparison: Two-view chest 02/13/2014.

CLINICAL DATA: RIGHT VENTRICULAR DYSFUNCTION

EXAM:
CHEST  2 VIEW

[w chest pa]
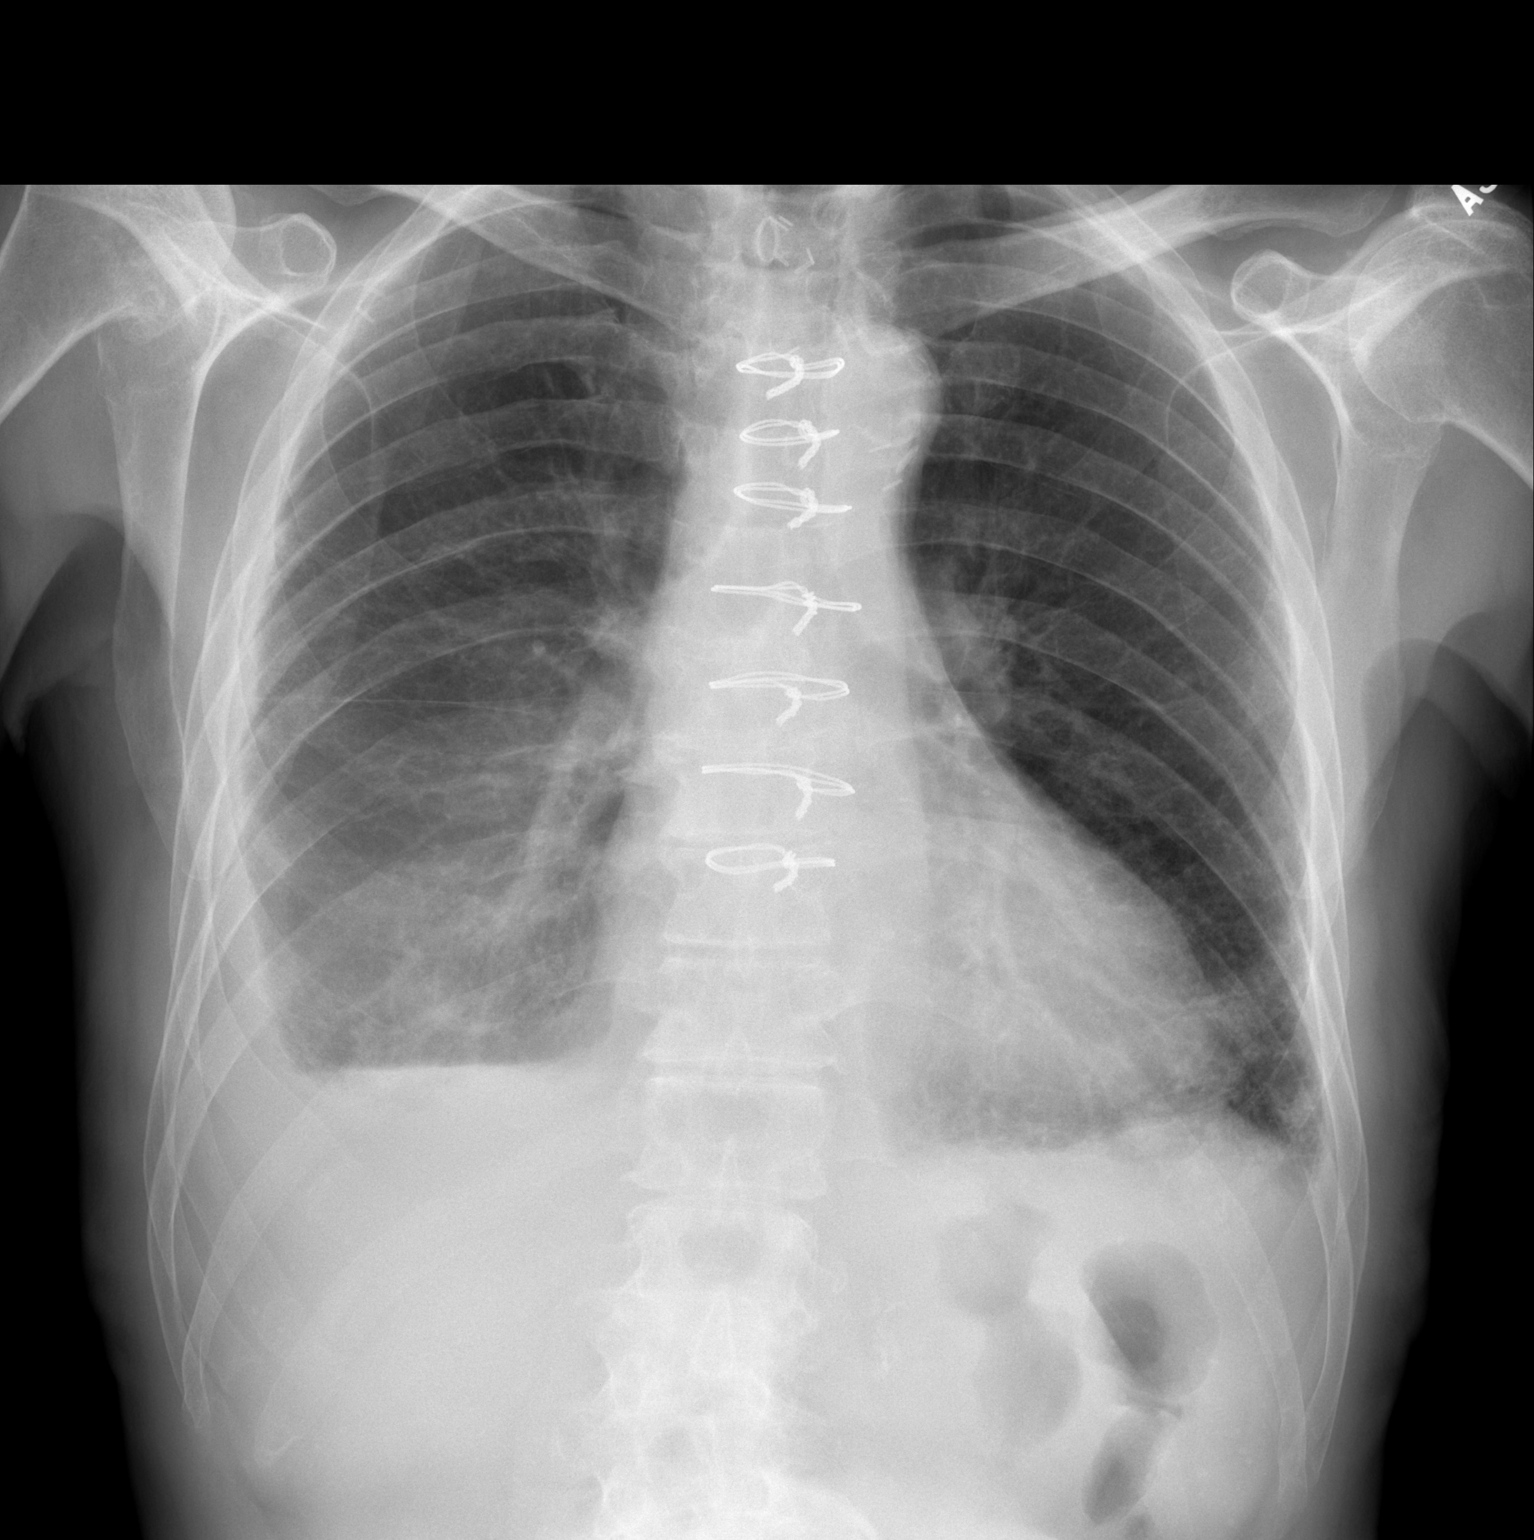

[w chest lat]
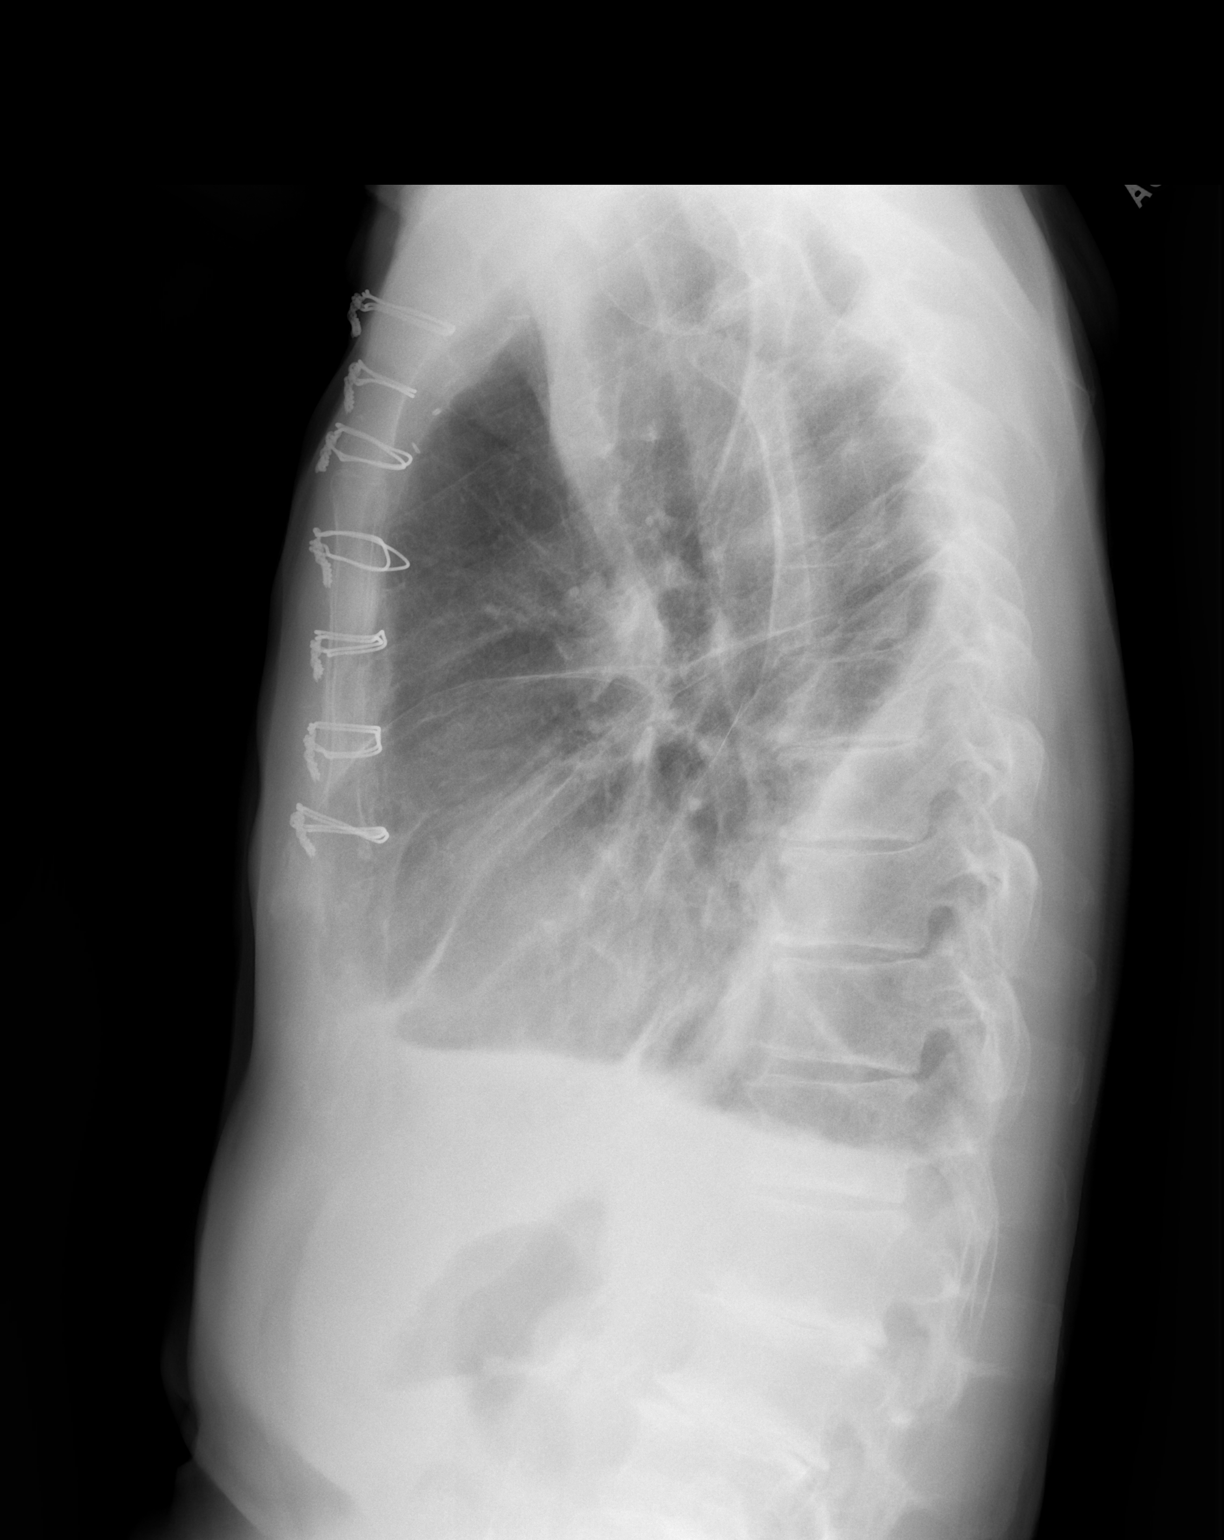

[2 of 2 positions shown; findings below may reference images not displayed]

FINDINGS: The right-sided central venous catheter is been removed. Small
bilateral effusions are appreciated right greater than left. The
left effusion is slightly smaller when compared to previous. Areas
of increased density within the lung bases. Cardiac silhouette is
stable. Patient is status post median sternotomy. Atherosclerotic
calcifications within the aorta. No further focal regions of
consolidation. Degenerative changes within the shoulders. No acute
osseous abnormalities.
IMPRESSION: Small bilateral effusions right greater than left. Left effusion
decreased from prior.

Likely areas of atelectasis within the lung bases.

## 2014-09-11 NOTE — Telephone Encounter (Signed)
Patient was on way to his appointment today when he had an episode of incontinence c diarrhea.  Patient was advised that per Dr. Baxter Flattery he did not need to come to today's visit.  He should start the oral vancomycin as discussed previously, pt is rescheduled to follow up 2/2 at 9:45. Pt verbalized understanding, agreement.  Prescription verified at [facility] by RN.  Pt's daughter Ivin Booty will pick it up today. Landis Gandy, RN

## 2014-09-15 ENCOUNTER — Encounter: Payer: Self-pay | Admitting: Internal Medicine

## 2014-09-15 NOTE — Telephone Encounter (Signed)
Can you make sure he has appt with me in about 2-3 wk

## 2014-09-18 DIAGNOSIS — J3089 Other allergic rhinitis: Secondary | ICD-10-CM | POA: Diagnosis not present

## 2014-09-18 DIAGNOSIS — J301 Allergic rhinitis due to pollen: Secondary | ICD-10-CM | POA: Diagnosis not present

## 2014-09-20 ENCOUNTER — Ambulatory Visit (INDEPENDENT_AMBULATORY_CARE_PROVIDER_SITE_OTHER): Payer: Medicare Other | Admitting: Family Medicine

## 2014-09-20 ENCOUNTER — Encounter: Payer: Self-pay | Admitting: Family Medicine

## 2014-09-20 VITALS — BP 156/80 | HR 60 | Ht 62.75 in | Wt 125.0 lb

## 2014-09-20 DIAGNOSIS — E039 Hypothyroidism, unspecified: Secondary | ICD-10-CM | POA: Diagnosis not present

## 2014-09-20 DIAGNOSIS — Z125 Encounter for screening for malignant neoplasm of prostate: Secondary | ICD-10-CM

## 2014-09-20 DIAGNOSIS — I10 Essential (primary) hypertension: Secondary | ICD-10-CM | POA: Diagnosis not present

## 2014-09-20 DIAGNOSIS — Z0000 Encounter for general adult medical examination without abnormal findings: Secondary | ICD-10-CM

## 2014-09-20 DIAGNOSIS — K219 Gastro-esophageal reflux disease without esophagitis: Secondary | ICD-10-CM

## 2014-09-20 DIAGNOSIS — D509 Iron deficiency anemia, unspecified: Secondary | ICD-10-CM

## 2014-09-20 DIAGNOSIS — A047 Enterocolitis due to Clostridium difficile: Secondary | ICD-10-CM

## 2014-09-20 DIAGNOSIS — F324 Major depressive disorder, single episode, in partial remission: Secondary | ICD-10-CM

## 2014-09-20 DIAGNOSIS — F325 Major depressive disorder, single episode, in full remission: Secondary | ICD-10-CM

## 2014-09-20 DIAGNOSIS — A0472 Enterocolitis due to Clostridium difficile, not specified as recurrent: Secondary | ICD-10-CM

## 2014-09-20 NOTE — Patient Instructions (Signed)
  HEALTH MAINTENANCE RECOMMENDATIONS:  It is recommended that you get at least 30 minutes of aerobic exercise at least 5 days/week (for weight loss, you may need as much as 60-90 minutes). This can be any activity that gets your heart rate up. This can be divided in 10-15 minute intervals if needed, but try and build up your endurance at least once a week.  Weight bearing exercise is also recommended twice weekly.  Eat a healthy diet with lots of vegetables, fruits and fiber.  "Colorful" foods have a lot of vitamins (ie green vegetables, tomatoes, red peppers, etc).  Limit sweet tea, regular sodas and alcoholic beverages, all of which has a lot of calories and sugar.  Up to 2 alcoholic drinks daily may be beneficial for men (unless trying to lose weight, watch sugars).  Drink a lot of water.  Sunscreen of at least SPF 30 should be used on all sun-exposed parts of the skin when outside between the hours of 10 am and 4 pm (not just when at beach or pool, but even with exercise, golf, tennis, and yard work!)  Use a sunscreen that says "broad spectrum" so it covers both UVA and UVB rays, and make sure to reapply every 1-2 hours.  Remember to change the batteries in your smoke detectors when changing your clock times in the spring and fall.  Use your seat belt every time you are in a car, and please drive safely and not be distracted with cell phones and texting while driving.  Your blood pressure was borderline today (it came down to 140/86 when it was repeated).  Continue to monitor it, and follow a low sodium diet.  It likely will improve once you are back on your regular exercise routine.  Follow up as scheduled with Dr. Baxter Flattery for the C.diff.  Continue your current medications.  Return in 6 months, sooner if you are having any problems.

## 2014-09-20 NOTE — Progress Notes (Signed)
Chief Complaint  Patient presents with  . Med check plus    nonfasting med check plus. No concerns.    Danny Frederick is a 81 y.o. male who presents for annual wellness visit and follow-up on chronic medical conditions.  He has the following concerns:  Recently treated for C. Diff with flagyl. Diarrhea resolved with treatment initially; went to the beach over Christmas, about a week after his return, he had recurrent diarrhea. He called over to ID, submitted another sample, which was still + for C.Diff.   He is currently on the second week of the second antibiotic (oral vanco), and is no longer having diarrhea--stopped after 3-4 days of medication.  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 He was treated with 6 wks of intravenous ceftriaxone plus rifampin. He was treated with 6 months of oral amoxicillin and 4 months of rifampin (stopped rifampin due to intolerability).  He developed the C.diff shortly after stopping the antibiotics.  Hypertension follow-up: Blood pressures elsewhere are 145-150/80. BP's have been elevated since his surgery.  Denies dizziness, headaches, chest pain. Denies side effects of medications. He finds he wants to nap once a day, whereas he didn't used to.    Depression:  "I don't need to be on this medication".   Unsure if it might cause some sleepiness. He denies depression, but admits to being frustrated, and worried about the C.diff--wondering if he is going to have recurrent diarrhea as soon as the current antibiotic course finishes.  The fecal incontinence was very embarrassing, hence his anxiety and worry about the future.  GERD: He continues to take omeprazole every other day with good results. He has h/o iron deficiency, and he continues to take iron every other day on the days he doesn't take the omeprazole. He has had recurrence of  reflux when PPI was stopped, and recurrence of anemia when iron was stopped in the past.  Hypothyroidism: Denies any symptoms--no changes in energy, weight, moods, hair/skin/bowels. He has lost some weight related to the diarrhea from C.diff.  He had regained back to his baseline weight since surgery, but lost related to the diarrhea. Appetite is good.   Immunization History  Administered Date(s) Administered  . Influenza Split 06/13/2011  . Influenza, High Dose Seasonal PF 06/09/2013, 05/23/2014  . Pneumococcal Conjugate-13 06/15/2013  . Pneumococcal Polysaccharide-23 09/02/2003  . Tdap 10/02/2005  . Zoster 03/11/2005   Last colonoscopy: 12/09 Last PSA: July 2014 Dentist: once a year Ophtho: yearly Exercise:he recently got back to the gym after a few weeks--felt fine. Hopes to ease back into it (but worried about having diarrhea related C.diff).    Other doctors caring for patient include: Neurosurgeon: Dr. Christella Noa (no longer sees) General surgeon (hemorrhoids): Dr. Redmond Pulling (no longer sees) Ophtho: [facility]: Dr. Edison Nasuti Dentist -- Dr. Archie Balboa Dr. Valda Favia at [facility] (audiologist) ID--Dr. Baxter Flattery CV surgeon--Dr. Roxy Manns Cardiology--Dr. Crenshaw  Depression screen:  See scanned questionnaire.  Notable only for slight decrease in energy and some decrease in appetite related to his antibiotics ADL screen:  See scanned questionnaire.  Notable for hearing problems.  No falls in the last year; some fecal incontinence related to C.diff diarrheal illness.  End of Life Discussion:  Patient has a living will and medical power of attorney  Past Medical History  Diagnosis Date  . Hypertension   . Unspecified hypothyroidism   . Mitral valve  prolapse     h/o; normal echo 12/2011 with no MVP seen  . Foot drop, right   . BPH (benign prostatic hypertrophy)   . Iron deficiency anemia, unspecified 6/09  . Hearing loss in left ear   . Diverticulosis of colon 12/09  . Internal  hemorrhoids   . GERD (gastroesophageal reflux disease)   . Allergic rhinitis, cause unspecified     on allergy shots (Dr. Orvil Feil)  . Allergic conjunctivitis   . Ruptured thoracic aortic aneurysm 01/23/2014  . 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 Right ventricular dysfunction 01/23/2014    Secondary to obstruction of main pulmonary artery  . Hx of echocardiogram     Echo (03/2014):  Mild LVH, EF 50-55%, no RWMA, Gr 1 DD, mild MR, mild reduced RVSF    Past Surgical History  Procedure Laterality Date  . Prostate surgery  2007    photovaporization  . Cervical laminectomy  1972    C5-6  . Spine surgery  2006    L4-5 disk surgery  . Rotator cuff repair  10/2008    left; Dr. Onnie Graham  . Esophagogastroduodenoscopy  10/31/08    normal; Dr. Edison Nasuti  . Tonsillectomy    . Hemorroidal banding  04/07/2013    x3-Dr.Eric Redmond Pulling  . Hemorrhoidectomy with hemorrhoid banding  04/07/13  . Spine surgery  04/2013    L4-5, L5-S1 fusion.  Dr. Christella Noa  . Thoracic aortic aneurysm repair N/A 01/23/2014    Procedure: THORACIC ASCENDING ANEURYSM REPAIR (AAA);  Surgeon: Rexene Alberts, MD;  Location: [place];  Service: Open Heart Surgery;  Laterality: N/A;    History   Social History  . Marital Status: Married    Spouse Name: N/A    Number of Children: 2  . Years of Education: N/A   Occupational History  . Not on file.   Social History Main Topics  . Smoking status: Former Smoker    Types: Cigarettes    Quit date: 09/01/1977  . Smokeless tobacco: Never Used  . Alcohol Use: Yes     Comment: 1-2 glasses wine per day  . Drug Use: No  . Sexual Activity: Not on file   Other Topics Concern  . Not on file   Social History Narrative   Lives with wife, pet rats (new)    Family History  Problem Relation Age of Onset  . Diabetes Mother   . Heart disease Mother     CHF  . Stroke Father   . Parkinsonism Father   . Cancer Brother     bladder cancer  . Cancer Brother     prostate cancer  . Emphysema  Brother   . Diabetes Sister   . Colon cancer Neg Hx   . Hypertension Mother   . Hypertension Father   . Depression Mother     Outpatient Encounter Prescriptions as of 09/20/2014  Medication Sig  . aspirin 81 MG tablet Take 81 mg by mouth at bedtime.   . citalopram (CELEXA) 20 MG tablet Take 1 tablet (20 mg total) by mouth daily.  . Ferrous Sulfate (IRON) 325 (65 FE) MG TABS Take 1 tablet by mouth every other day.  . lisinopril (PRINIVIL,ZESTRIL) 20 MG tablet Take 1 tablet (20 mg total) by mouth daily.  . metoprolol (LOPRESSOR) 50 MG tablet Take 1 tablet (50 mg total) by mouth 2 (two) times daily.  Marland Kitchen omeprazole (PRILOSEC) 20 MG capsule Take 1 capsule by mouth  every other day  . SYNTHROID 50 MCG  tablet Take 1 tablet by mouth  daily before breakfast  . vancomycin (VANCOCIN) 50 mg/mL oral solution Take 2.5 mLs (125 mg total) by mouth every 6 (six) hours. X 14 days  . saccharomyces boulardii (FLORASTOR) 250 MG capsule Take 1 capsule (250 mg total) by mouth 2 (two) times daily. (Patient not taking: Reported on 09/20/2014)  . triamcinolone cream (KENALOG) 0.1 % Apply 1 application topically 2 (two) times daily. (Patient not taking: Reported on 08/09/2014)    No Known Allergies   ROS: The patient denies anorexia, fever, headaches, vision loss, ear pain, hoarseness, chest pain, palpitations, dizziness, syncope, dyspnea on exertion, cough, swelling, nausea, vomiting, constipation, abdominal pain, melena, hematochezia, indigestion/heartburn, hematuria, incontinence, erectile dysfunction, nocturia, weakened urine stream, dysuria, genital lesions, joint pains, numbness, tingling, weakness, tremor, suspicious skin lesions, depression, anxiety, abnormal bleeding/bruising, or enlarged lymph nodes No further problems with back pain (s/p surgery); no hemorrhoidal problems. Hearing unchanged--L ear, uses aid Dry winter skin.  Diarrhea--resolved after starting second round of antibiotics for  C.diff Decreased libido since illness  PHYSICAL EXAM:  BP 156/80 mmHg  Pulse 60  Ht 5' 2.75" (1.594 m)  Wt 125 lb (56.7 kg)  BMI 22.32 kg/m2 140/86 on repeat by MD  General Appearance:   Alert, cooperative, no distress, appears stated age  Head:   Normocephalic, without obvious abnormality, atraumatic  Eyes:   PERRL, conjunctiva/corneas clear, EOM's intact, fundi   benign  Ears:   normal external ears. +hearing aid  Nose:  Nares normal, mucosa normal, no drainage or sinus tenderness  Throat:  Lips, mucosa, and tongue normal; upper dentures  Neck:  Supple, no lymphadenopathy; thyroid: no enlargement/tenderness/nodules; no carotid  bruit or JVD  Back:  Spine nontender, no curvature, no CVA tenderness. WHSS midline spine,  Lungs:   Clear to auscultation bilaterally without wheezes, rales or ronchi; respirations unlabored  Chest Wall:   No tenderness. WHSS midline chest, with many small vessels across the scar (making scar appear blue-tinged)   Heart:   Regular rate and rhythm, S1 and S2 normal, no murmur, rub  or gallop  Breast Exam:   No chest wall tenderness, masses or gynecomastia  Abdomen:   Soft, non-tender, nondistended, normoactive bowel sounds,   no masses, no hepatosplenomegaly  Genitalia:   Normal male external genitalia without lesions. Testicles without masses. No inguinal hernias.  Rectal:   Normal sphincter tone, no masses or tenderness; guaiac negative stool. Prostate smooth, no nodules, not enlarged.  Extremities:  No clubbing, cyanosis or edema  Pulses:  2+ and symmetric all extremities  Skin:  Skin color, texture, turgor normal, no rashes or lesions. Some actinic changes to skin, but no precancerous or suspicious lesions noted  Lymph nodes:  Cervical, supraclavicular, and axillary nodes normal  Neurologic:  CNII-XII intact, normal strength, sensation and gait; reflexes 2+ and symmetric  throughout   Psych: Normal mood, affect, hygiene and grooming.Appears frustrated, but full range of affect    Lab Results  Component Value Date   TSH 3.392 06/21/2014   Lab Results  Component Value Date   PSA 1.39 03/07/2013   PSA 1.36 12/25/2011     Chemistry      Component Value Date/Time   NA 132* 06/27/2014 1057   K 4.5 06/27/2014 1057   CL 97 06/27/2014 1057   CO2 27 06/27/2014 1057   BUN 15 06/27/2014 1057   CREATININE 0.99 06/27/2014 1057   CREATININE 0.69 02/02/2014 0415      Component Value Date/Time  CALCIUM 9.2 06/27/2014 1057   ALKPHOS 78 05/15/2014 1135   AST 19 05/15/2014 1135   ALT 13 05/15/2014 1135   BILITOT 0.5 05/15/2014 1135     Lab Results  Component Value Date   WBC 7.3 05/15/2014   HGB 14.5 05/15/2014   HCT 43.2 05/15/2014   MCV 82.6 05/15/2014   PLT 347 05/15/2014   Lab Results  Component Value Date   CHOL 162 01/09/2012   HDL 60 01/09/2012   LDLCALC 93 01/09/2012   TRIG 47 01/09/2012   CHOLHDL 2.7 01/09/2012    ASSESSMENT/PLAN:  Medicare annual wellness visit, subsequent  Hypothyroidism, unspecified hypothyroidism type - clinically euthyroid - Plan: TSH  Essential hypertension, benign - BP's running higher than prior to illness; borderline.  continue low sodium diet, monitor regularly. resume regular exercise as tolerated - Plan: Lipid panel, Comprehensive metabolic panel  Gastroesophageal reflux disease, esophagitis presence not specified - controlled  Iron deficiency anemia - Plan: CBC with Differential  Depression, major, in remission - strongly encouraged him to continue medication until after current illness (c.diff) completely resolved--for 1 year of treatment (minimum)  Enteritis due to Clostridium difficile - complete oral vanco and f/u with ID, as planned - Plan: CBC with Differential, Comprehensive metabolic panel  Screening for prostate cancer - Plan: PSA, Medicare   BP's  borderline. Lower at home after going to the gym.  Continue low sodium diet, regular exercise and monitoring.  Discussed PSA screening (risks/benefits), recommended at least 30 minutes of aerobic activity at least 5 days/week; proper sunscreen use reviewed; healthy diet and alcohol recommendations (less than or equal to 2 drinks/day) reviewed; regular seatbelt use; changing batteries in smoke detectors. Immunization recommendations discussed--UTD.  Colonoscopy recommendations reviewed, UTD.  Full Code/Full Care.  Has POA and living will.  Medicare Attestation I have personally reviewed: The patient's medical and social history Their use of alcohol, tobacco or illicit drugs Their current medications and supplements The patient's functional ability including ADLs,fall risks, home safety risks, cognitive, and hearing and visual impairment Diet and physical activities Evidence for depression or mood disorders  The patient's weight, height, BMI, and visual acuity have been recorded in the chart.  I have made referrals, counseling, and provided education to the patient based on review of the above  Danny Frederick A, MD   09/20/2014

## 2014-09-21 DIAGNOSIS — J3089 Other allergic rhinitis: Secondary | ICD-10-CM | POA: Diagnosis not present

## 2014-09-21 DIAGNOSIS — J301 Allergic rhinitis due to pollen: Secondary | ICD-10-CM | POA: Diagnosis not present

## 2014-09-26 ENCOUNTER — Other Ambulatory Visit: Payer: Medicare Other

## 2014-09-27 ENCOUNTER — Other Ambulatory Visit: Payer: Medicare Other

## 2014-09-27 DIAGNOSIS — A0472 Enterocolitis due to Clostridium difficile, not specified as recurrent: Secondary | ICD-10-CM

## 2014-09-27 DIAGNOSIS — E039 Hypothyroidism, unspecified: Secondary | ICD-10-CM

## 2014-09-27 DIAGNOSIS — A047 Enterocolitis due to Clostridium difficile: Secondary | ICD-10-CM | POA: Diagnosis not present

## 2014-09-27 DIAGNOSIS — J301 Allergic rhinitis due to pollen: Secondary | ICD-10-CM | POA: Diagnosis not present

## 2014-09-27 DIAGNOSIS — J3089 Other allergic rhinitis: Secondary | ICD-10-CM | POA: Diagnosis not present

## 2014-09-27 DIAGNOSIS — D509 Iron deficiency anemia, unspecified: Secondary | ICD-10-CM | POA: Diagnosis not present

## 2014-09-27 DIAGNOSIS — Z125 Encounter for screening for malignant neoplasm of prostate: Secondary | ICD-10-CM

## 2014-09-27 DIAGNOSIS — I10 Essential (primary) hypertension: Secondary | ICD-10-CM | POA: Diagnosis not present

## 2014-09-27 LAB — CBC WITH DIFFERENTIAL/PLATELET
BASOS ABS: 0.1 10*3/uL (ref 0.0–0.1)
Basophils Relative: 1 % (ref 0–1)
EOS PCT: 5 % (ref 0–5)
Eosinophils Absolute: 0.3 10*3/uL (ref 0.0–0.7)
HCT: 44.4 % (ref 39.0–52.0)
Hemoglobin: 14.5 g/dL (ref 13.0–17.0)
LYMPHS ABS: 2.3 10*3/uL (ref 0.7–4.0)
LYMPHS PCT: 41 % (ref 12–46)
MCH: 27.8 pg (ref 26.0–34.0)
MCHC: 32.7 g/dL (ref 30.0–36.0)
MCV: 85.1 fL (ref 78.0–100.0)
MPV: 9.2 fL (ref 8.6–12.4)
Monocytes Absolute: 0.6 10*3/uL (ref 0.1–1.0)
Monocytes Relative: 10 % (ref 3–12)
Neutro Abs: 2.4 10*3/uL (ref 1.7–7.7)
Neutrophils Relative %: 43 % (ref 43–77)
Platelets: 283 10*3/uL (ref 150–400)
RBC: 5.22 MIL/uL (ref 4.22–5.81)
RDW: 14.3 % (ref 11.5–15.5)
WBC: 5.5 10*3/uL (ref 4.0–10.5)

## 2014-09-27 LAB — TSH: TSH: 1.26 u[IU]/mL (ref 0.350–4.500)

## 2014-09-27 LAB — COMPREHENSIVE METABOLIC PANEL
ALK PHOS: 83 U/L (ref 39–117)
ALT: 20 U/L (ref 0–53)
AST: 27 U/L (ref 0–37)
Albumin: 3.7 g/dL (ref 3.5–5.2)
BILIRUBIN TOTAL: 0.8 mg/dL (ref 0.2–1.2)
BUN: 16 mg/dL (ref 6–23)
CHLORIDE: 101 meq/L (ref 96–112)
CO2: 31 mEq/L (ref 19–32)
Calcium: 9.2 mg/dL (ref 8.4–10.5)
Creat: 0.89 mg/dL (ref 0.50–1.35)
Glucose, Bld: 79 mg/dL (ref 70–99)
Potassium: 4.3 mEq/L (ref 3.5–5.3)
Sodium: 139 mEq/L (ref 135–145)
TOTAL PROTEIN: 7.5 g/dL (ref 6.0–8.3)

## 2014-09-27 LAB — LIPID PANEL
CHOL/HDL RATIO: 3.2 ratio
CHOLESTEROL: 200 mg/dL (ref 0–200)
HDL: 63 mg/dL (ref >39)
LDL CALC: 119 mg/dL — AB (ref 0–99)
TRIGLYCERIDES: 88 mg/dL (ref <150)
VLDL: 18 mg/dL (ref 0–40)

## 2014-09-28 LAB — PSA, MEDICARE: PSA: 1.31 ng/mL (ref <=4.00)

## 2014-10-03 ENCOUNTER — Ambulatory Visit (INDEPENDENT_AMBULATORY_CARE_PROVIDER_SITE_OTHER): Payer: Medicare Other | Admitting: Internal Medicine

## 2014-10-03 ENCOUNTER — Encounter: Payer: Self-pay | Admitting: Internal Medicine

## 2014-10-03 VITALS — BP 192/98 | HR 67 | Temp 97.4°F | Ht 64.0 in | Wt 132.0 lb

## 2014-10-03 DIAGNOSIS — A047 Enterocolitis due to Clostridium difficile: Secondary | ICD-10-CM

## 2014-10-03 DIAGNOSIS — J3089 Other allergic rhinitis: Secondary | ICD-10-CM | POA: Diagnosis not present

## 2014-10-03 DIAGNOSIS — J301 Allergic rhinitis due to pollen: Secondary | ICD-10-CM | POA: Diagnosis not present

## 2014-10-03 DIAGNOSIS — A0471 Enterocolitis due to Clostridium difficile, recurrent: Secondary | ICD-10-CM

## 2014-10-03 NOTE — Progress Notes (Signed)
Subjective:    Patient ID: Danny Frederick, male    DOB: 1934-03-11, 81 y.o.   MRN: [id number]  HPI 81 yo M with history of HTN, life threatening hospitalizaiton in May 2015 for ruptured thoracic aortic aneurysm and strep pneumonia endocarditis. He was treated with prolonged IV antibiotics and amoxicillin for suppressive therapy up until Fall 2015. In the first week of December, he was noted to have profuse diarrhea, was diagnosed by PCP with cdifficile infection by cdiff toxin assay on 12/15. he was given 14 day course of flagyl 500mg  TID. The patient reports that it worked.Lost 5-7 lb. Initially but now back to your base line Once he finished hte course of antibiotics, he did well for a few days but then noted to have intermittent loose stools around the new year. He was diagnosed with recurrent cdifficile on 09/07/2014 by PCR 2nd time. He was given a course of oral vancomycin 79mg  liquid QID with addition of taking probiotics for 2 wk which he has finished roughly 2 wks ago. (This Thursday, it will have been 2 wks) he states he is not having diarrhea. He is here in clinic to determine option, when he is clear infection, what happens if he has recurrence.  He is in clinic with his daughter    Current Outpatient Prescriptions on File Prior to Visit  Medication Sig Dispense Refill  . aspirin 81 MG tablet Take 81 mg by mouth at bedtime.     . citalopram (CELEXA) 20 MG tablet Take 1 tablet (20 mg total) by mouth daily. 90 tablet 1  . Ferrous Sulfate (IRON) 325 (65 FE) MG TABS Take 1 tablet by mouth every other day.    . lisinopril (PRINIVIL,ZESTRIL) 20 MG tablet Take 1 tablet (20 mg total) by mouth daily. 90 tablet 3  . metoprolol (LOPRESSOR) 50 MG tablet Take 1 tablet (50 mg total) by mouth 2 (two) times daily. 180 tablet 1  . omeprazole (PRILOSEC) 20 MG capsule Take 1 capsule by mouth  every other day 45 capsule 3  . SYNTHROID 50 MCG tablet Take 1 tablet by mouth  daily before breakfast 90 tablet  0  . triamcinolone cream (KENALOG) 0.1 % Apply 1 application topically 2 (two) times daily. 454 g 0  . saccharomyces boulardii (FLORASTOR) 250 MG capsule Take 1 capsule (250 mg total) by mouth 2 (two) times daily. (Patient not taking: Reported on 09/20/2014) 60 capsule 0  . vancomycin (VANCOCIN) 50 mg/mL oral solution Take 2.5 mLs (125 mg total) by mouth every 6 (six) hours. X 14 days (Patient not taking: Reported on 10/03/2014) 140 mL 0   No current facility-administered medications on file prior to visit.   Active Ambulatory Problems    Diagnosis Date Noted  . IRON DEFICIENCY ANEMIA SECONDARY TO BLOOD LOSS 11/08/2008  . ANEMIA 05/26/2008  . HYPERTENSION 03/02/2008  . DYSPNEA 03/02/2008  . Hypothyroidism 01/22/2011  . Essential hypertension, benign 01/22/2011  . GERD (gastroesophageal reflux disease) 01/22/2011  . Iron deficiency anemia 01/22/2011  . Syncope 01/22/2012  . Allergic conjunctivitis 10/20/2012  . Solitary pulmonary nodule 12/08/2013  . Ruptured thoracic aortic aneurysm 01/23/2014  . 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 Right ventricular dysfunction 01/23/2014  . Bacteremia   . Depression, major, in remission 03/30/2014   Resolved Ambulatory Problems    Diagnosis Date Noted  . Internal hemorrhoids 09/23/2012  . Pulmonary embolus 01/23/2014   Past Medical History  Diagnosis Date  . Hypertension   . Unspecified hypothyroidism   .  Mitral valve prolapse   . Foot drop, right   . BPH (benign prostatic hypertrophy)   . Iron deficiency anemia, unspecified 6/09  . Hearing loss in left ear   . Diverticulosis of colon 12/09  . Allergic rhinitis, cause unspecified   . Hx of echocardiogram      Review of Systems  Constitutional: Negative for fever, chills, diaphoresis, activity change, appetite change, fatigue and unexpected weight change.  HENT: Negative for congestion, sore throat, rhinorrhea, sneezing, trouble swallowing and sinus pressure.  Eyes: Negative for  photophobia and visual disturbance.  Respiratory: Negative for cough, chest tightness, shortness of breath, wheezing and stridor.  Cardiovascular: Negative for chest pain, palpitations and leg swelling.  Gastrointestinal: Negative for nausea, vomiting, abdominal pain, diarrhea, constipation, blood in stool, abdominal distention and anal bleeding.  Genitourinary: Negative for dysuria, hematuria, flank pain and difficulty urinating.  Musculoskeletal: Negative for myalgias, back pain, joint swelling, arthralgias and gait problem.  Skin: Negative for color change, pallor, rash and wound.  Neurological: Negative for dizziness, tremors, weakness and light-headedness.  Hematological: Negative for adenopathy. Does not bruise/bleed easily.  Psychiatric/Behavioral: Negative for behavioral problems, confusion, sleep disturbance, dysphoric mood, decreased concentration and agitation.       Objective:   Physical Exam BP 192/98 mmHg  Pulse 67  Temp(Src) 97.4 F (36.3 C) (Oral)  Ht 5\' 4"  (1.626 m)  Wt 132 lb (59.875 kg)  BMI 22.65 kg/m2  gen = a xo by 3  In NAD BMET    Component Value Date/Time   NA 139 09/27/2014 0942   K 4.3 09/27/2014 0942   CL 101 09/27/2014 0942   CO2 31 09/27/2014 0942   GLUCOSE 79 09/27/2014 0942   BUN 16 09/27/2014 0942   CREATININE 0.89 09/27/2014 0942   CREATININE 0.69 02/02/2014 0415   CALCIUM 9.2 09/27/2014 0942   GFRNONAA 73 06/27/2014 1057   GFRNONAA 89* 02/02/2014 0415   GFRAA 84 06/27/2014 1057   GFRAA >90 02/02/2014 0415        Assessment & Plan:  Recurrent c.difficile = currently appears to have responded to a 2 wk course of oral vancomycin. Will watch and wait to see if have 3rd recurrence. We discussed if he has 3rd recurrence we would also go ahead treat again with oral vanco capsule (insurance covers it) and pursue fecal transplant. Gave info on [facility] stool bank as well as donor screening criteria.  Spent 25 min of face to face time in  counseling for recurrent c.difficile

## 2014-10-11 DIAGNOSIS — J3089 Other allergic rhinitis: Secondary | ICD-10-CM | POA: Diagnosis not present

## 2014-10-11 DIAGNOSIS — J301 Allergic rhinitis due to pollen: Secondary | ICD-10-CM | POA: Diagnosis not present

## 2014-10-18 ENCOUNTER — Telehealth: Payer: Self-pay | Admitting: *Deleted

## 2014-10-18 DIAGNOSIS — J3089 Other allergic rhinitis: Secondary | ICD-10-CM | POA: Diagnosis not present

## 2014-10-18 DIAGNOSIS — J301 Allergic rhinitis due to pollen: Secondary | ICD-10-CM | POA: Diagnosis not present

## 2014-10-18 NOTE — Telephone Encounter (Signed)
He sent me an email and i asked him to send a photo and see his primary care doctor to see if he needs antibiotics given his hx of cdifficile. No need to call back.

## 2014-10-18 NOTE — Telephone Encounter (Signed)
Infection in "nose" x 1 month, using Neosporin oint.  Pt wants to know if there is anything else he should be doing.  He is being very cautious about the use of antibiotics but also wants his nose to heal.  MD, please advise.

## 2014-10-19 ENCOUNTER — Encounter: Payer: Self-pay | Admitting: Family Medicine

## 2014-10-19 ENCOUNTER — Ambulatory Visit (INDEPENDENT_AMBULATORY_CARE_PROVIDER_SITE_OTHER): Payer: Medicare Other | Admitting: Family Medicine

## 2014-10-19 VITALS — BP 132/74 | HR 60 | Temp 97.2°F | Ht 62.75 in | Wt 133.0 lb

## 2014-10-19 DIAGNOSIS — L089 Local infection of the skin and subcutaneous tissue, unspecified: Secondary | ICD-10-CM | POA: Diagnosis not present

## 2014-10-19 MED ORDER — MUPIROCIN CALCIUM 2 % NA OINT: TOPICAL_OINTMENT | NASAL | Status: DC

## 2014-10-19 NOTE — Progress Notes (Signed)
Chief Complaint  Patient presents with  . Rash    under left nostril that will not go away. Has been there about a month. Not painful or itchy.    He has a constant runny nose. He started noticing a sore or irritation at the left nostril about a month ago.  It isn't painful.  He had noticed in the past a small pustule, which he poked/drained with a pin (not recently).  Denies fevers, chills. Nasal drainage is clear/watery. He has been using neosporin topically. He continues to take an antihistamine daily, and is on immunotherapy for allergies.  He spoke with ID, Dr. Graylon Good, who recommended that he be seen here today.  He completed his oral vancomycin 2-3 weeks ago for C. Diff and hasn't had any recurrence of diarrhea.  PMH, PSH, SH reviewed. Outpatient Encounter Prescriptions as of 10/19/2014  Medication Sig  . aspirin 81 MG tablet Take 81 mg by mouth at bedtime.   . citalopram (CELEXA) 20 MG tablet Take 1 tablet (20 mg total) by mouth daily.  . Ferrous Sulfate (IRON) 325 (65 FE) MG TABS Take 1 tablet by mouth every other day.  . lisinopril (PRINIVIL,ZESTRIL) 20 MG tablet Take 1 tablet (20 mg total) by mouth daily.  . metoprolol (LOPRESSOR) 50 MG tablet Take 1 tablet (50 mg total) by mouth 2 (two) times daily.  Marland Kitchen omeprazole (PRILOSEC) 20 MG capsule Take 1 capsule by mouth  every other day  . SYNTHROID 50 MCG tablet Take 1 tablet by mouth  daily before breakfast  . triamcinolone cream (KENALOG) 0.1 % Apply 1 application topically 2 (two) times daily.  . [DISCONTINUED] saccharomyces boulardii (FLORASTOR) 250 MG capsule Take 1 capsule (250 mg total) by mouth 2 (two) times daily. (Patient not taking: Reported on 09/20/2014)  . [DISCONTINUED] vancomycin (VANCOCIN) 50 mg/mL oral solution Take 2.5 mLs (125 mg total) by mouth every 6 (six) hours. X 14 days (Patient not taking: Reported on 10/03/2014)   He is taking antihistamine daily, and allergy shots  No Known Allergies  ROS: no fevers, chills,  URI symptoms, cough, shortness of breath.  No further diarrhea, no nausea, vomiting.  He still occasionally has some discomfort across the chest. No exertional chest pain or palpitations. Balance hasn't been as good in the last year as prior to his illness  PHYSICAL EXAM: BP 132/74 mmHg  Pulse 60  Temp(Src) 97.2 F (36.2 C) (Tympanic)  Ht 5' 2.75" (1.594 m)  Wt 133 lb (60.328 kg)  BMI 23.74 kg/m2  Well developed, elderly male, in good spirits HEENT: PERRL, EOMI, conjunctiva clear.  Nasal mucosa is normal bilaterally without erythema or purulence.  At the distal portion of the left nares there is a papule with some superficial overlying ulceration.  There is a very slight fissure at the lateral margin of the nares, and inferior to the nose there is a small area of redness.  There is no yellow crusting, soft tissue swelling, warmth. Sinuses are nontender Neck: no lymphadenopathy Heart: regular rate and rhythm Lungs: clear bilaterally   ASSESSMENT/PLAN:   Skin infection  Superficial infection of the skin at the distal nose. Will cover for bacterial infection, including MRSA but using topical bactroban ointment. Want to avoid any oral antibiotics given his recurrent issues with C.diff s/p long term antibiotics.

## 2014-10-19 NOTE — Patient Instructions (Signed)
Keep the area clean. Continue allergy treatments to minimize the runny nose and irritation from tissues   Use the bactroban ointment 2-3 times/day for a week, applying to the affected area on the left side of the nose

## 2014-10-25 DIAGNOSIS — J3089 Other allergic rhinitis: Secondary | ICD-10-CM | POA: Diagnosis not present

## 2014-10-25 DIAGNOSIS — J301 Allergic rhinitis due to pollen: Secondary | ICD-10-CM | POA: Diagnosis not present

## 2014-10-30 ENCOUNTER — Telehealth: Payer: Self-pay | Admitting: Internal Medicine

## 2014-10-30 ENCOUNTER — Telehealth: Payer: Self-pay | Admitting: *Deleted

## 2014-10-30 NOTE — Telephone Encounter (Signed)
Patient advised of appointment with Nicoletta Ba on 11/01/14 @ 2 pm. He verbalizes understanding.

## 2014-10-30 NOTE — Telephone Encounter (Signed)
Patient reports having watery diarrhea x 6 with abdominal crapming on feb 26 and 27th. Due to having perfuse diarrhea, he called after hours for evaluation. Since this would be his 3rd episode, i called in rx for oral vanco 125mg  QID x 10 days, with taper for presumed recurrent c.difficile. He had been free of symptoms for 4 wk. No recent abtx exposure. The patient states that he started his oral vanco on 2/27, 2/28 and now 2/29, he has had improvement with his BM, no longer watery.  Will refer to [facility] for FMT. We discussed either having his daughter be the donor vs. [facility] stool

## 2014-10-30 NOTE — Telephone Encounter (Signed)
-----  Message from Jerene Bears, MD sent at 10/30/2014  1:34 PM EST ----- Wed with Amy is fine Thanks JMP  ----- Message -----    From: Larina Bras, CMA    Sent: 10/30/2014   1:17 PM      To: Jerene Bears, MD  Dr Norman Herrlich-- Amy has availability on Wed. But you are [facility] all day. She does not have availability when you are in clinic. Would you be okay with Wed. Appt with Amy or would you rather me set up with Cecille Rubin or Janett Billow while you are actually office?  ----- Message -----    From: Jerene Bears, MD    Sent: 10/30/2014  10:30 AM      To: Gatha Mayer, MD, Larina Bras, CMA, #  Dorena Bodo is away on vacation I could get him in to see an APP on a day that I am in clinic so that I could meet him. I have a hospital week next week so this is an option if this gives enough time from [facility]. Would next week for colonoscopy work for you?  Dottie Get him in with APP this week please, Amy if possible.  Re: FMT for recurrent c diff.  Thanks Ulice Dash  ----- Message -----    From: Carlyle Basques, MD    Sent: 10/30/2014  10:12 AM      To: Gatha Mayer, MD, Jerene Bears, MD  Hi there-  Mr. Encinas is another patient that I think would benefit from [facility] [place]. This is his 3rd bout. He is a relatively healthy 81yo M. He did have life-threatening strep pneumo infection in may 2015 with endocarditis, aortic mycotic aneurysm required steadfast intervention by [facility]. He was treated with 6 wks of IV abtx then oral suppression for many month. He was doing well until this winter, when he had his 1st bout of cdiff, 2nd bout in January. He did well for 4 wk, and just relapsed this wkd. He is on vanco taper. He is up for either [facility] or his daughter being the donor. They have met with me already for FMT information which i reviewed again over the phone.  Let me know when you can see him. thx

## 2014-10-31 ENCOUNTER — Other Ambulatory Visit: Payer: Self-pay | Admitting: Internal Medicine

## 2014-10-31 ENCOUNTER — Encounter: Payer: Self-pay | Admitting: Physician Assistant

## 2014-10-31 ENCOUNTER — Encounter (HOSPITAL_COMMUNITY): Payer: Self-pay | Admitting: *Deleted

## 2014-10-31 DIAGNOSIS — A0471 Enterocolitis due to Clostridium difficile, recurrent: Secondary | ICD-10-CM

## 2014-10-31 MED ORDER — MOVIPREP 100 G PO SOLR: 1.0000 | Freq: Once | ORAL | Status: DC

## 2014-10-31 NOTE — Progress Notes (Signed)
-----  Message -----    From: Jerene Bears, MD    Sent: 10/30/2014  10:30 AM      To: Gatha Mayer, MD, Larina Bras, CMA, *  Dorena Bodo is away on vacation I could get him in to see an APP on a day that I am in clinic so that I could meet him. I have a hospital week next week so this is an option if this gives enough time from [facility]. Would next week for colonoscopy work for you?  Dottie Get him in with APP this week please, Amy if possible.  Re: FMT for recurrent c diff.  Thanks Ulice Dash  ----- Message -----    From: Carlyle Basques, MD    Sent: 10/30/2014  10:12 AM      To: Gatha Mayer, MD, Jerene Bears, MD  Hi there-  Mr. Mehrer is another patient that I think would benefit from [facility]. This is his 3rd bout. He is a relatively healthy 81yo M. He did have life-threatening strep pneumo infection in may 2015 with endocarditis, aortic mycotic aneurysm required steadfast intervention by [facility]. He was treated with 6 wks of IV abtx then oral suppression for many month. He was doing well until this winter, when he had his 1st bout of cdiff, 2nd bout in January. He did well for 4 wk, and just relapsed this wkd. He is on vanco taper. He is up for either [facility] or his daughter being the donor. They have met with me already for FMT information which i reviewed again over the phone.  Let me know when you can see him. thx

## 2014-10-31 NOTE — Progress Notes (Signed)
Per Dr Hilarie Fredrickson and Dr Baxter Flattery, patient has been scheduled for colonoscopy with fecal transplant using propofol at [facility] on 11/09/14 @ 1:30 pm. Instructions have been created and printed and we will go over these with patient when he arrives to see Nicoletta Ba, PA-C tomorrow. Patient will not need donor instructions as Dr Baxter Flattery will be getting the stool from lab.

## 2014-11-01 ENCOUNTER — Ambulatory Visit (INDEPENDENT_AMBULATORY_CARE_PROVIDER_SITE_OTHER): Payer: Medicare Other | Admitting: Physician Assistant

## 2014-11-01 ENCOUNTER — Encounter: Payer: Self-pay | Admitting: Physician Assistant

## 2014-11-01 VITALS — BP 142/82 | HR 59 | Ht 64.0 in | Wt 135.0 lb

## 2014-11-01 DIAGNOSIS — B9689 Other specified bacterial agents as the cause of diseases classified elsewhere: Secondary | ICD-10-CM

## 2014-11-01 DIAGNOSIS — A498 Other bacterial infections of unspecified site: Secondary | ICD-10-CM

## 2014-11-01 DIAGNOSIS — R197 Diarrhea, unspecified: Secondary | ICD-10-CM | POA: Diagnosis not present

## 2014-11-01 NOTE — Progress Notes (Addendum)
Patient ID: Amanda Wakeland, male   DOB: 02/29/1934, 81 y.o.   MRN: [id number]   Subjective:    Patient ID: Matthew Lesiak, male    DOB: 03/15/1934, 81 y.o.   MRN: [id number]  HPI Martrell is a pleasant 81-year-old white male referred to Dr.Pyrtle by Dr. Cynthia Snider for fecal microbial transplant  In the setting of recurrent C. difficile colitis. Patient comes to the office today to discuss .. Patient had been seen previously in the office by Dr. Jacobs and had undergone workup in 2010 for an anemia and heme positive stool. He had capsule endoscopy which was negative and colonoscopy showed mild diverticulosis in the sigmoid colon, and internal hemorrhoids EGD was negative. . Patient initially developed C. difficile infection after a prolonged hospitalization in May 2015 with a ruptured thoracic aortic aneurysm complicated by strep pneumonia a endocarditis. He required prolonged IV antibiotics and then amoxicillin for suppression through the followed 2015. He developed profuse diarrhea in December 2015 and was diagnosed with C. difficile. He was initially treated with a course of Flagyl and responded , however within a few days of coming off antibiotics he had recurrence of diarrhea. He was diagnosed with recurrent C. difficile by stool for PCR on 09/07/2014. At that time he was given a course of oral vancomycin 125 mg by mouth 4 times daily in addition to probiotics. He finished this and felt better. He had seen Dr. Cynthia Snider for in early February 2016 just to discuss his options should he have a recurrence.  He did well for about 5 weeks and then had onset of diarrhea again at the end of February. He is now back on vancomycin 125 mg 4 times daily. He says he responded very quickly in his stools are about back to normal. He did not have any associated fever chills nausea vomiting abdominal pain just developed recurrent diarrhea. He says he is back to having one to 2 bowel movements per day.  At this time  he has been set up for a fecal  Microbial transplant with plans to use the [facility] as his donor source. He is scheduled for the procedure on March 10 with Dr.Pyrtle.   Review of Systems Pertinent positive and negative review of systems were noted in the above HPI section.  All other review of systems was otherwise negative.  Outpatient Encounter Prescriptions as of 11/01/2014  Medication Sig  . aspirin 81 MG tablet Take 81 mg by mouth at bedtime.   . citalopram (CELEXA) 20 MG tablet Take 1 tablet (20 mg total) by mouth daily.  . Ferrous Sulfate (IRON) 325 (65 FE) MG TABS Take 1 tablet by mouth every other day.  . lisinopril (PRINIVIL,ZESTRIL) 20 MG tablet Take 1 tablet (20 mg total) by mouth daily.  . loratadine (CLARITIN) 10 MG tablet Take 10 mg by mouth daily.  . metoprolol (LOPRESSOR) 50 MG tablet Take 1 tablet (50 mg total) by mouth 2 (two) times daily.  . MOVIPREP 100 G SOLR Take 1 kit (200 g total) by mouth once.  . mupirocin nasal ointment (BACTROBAN NASAL) 2 % Apply to affected area of nose 2-3 times each day for a week  . omeprazole (PRILOSEC) 20 MG capsule Take 1 capsule by mouth  every other day  . SYNTHROID 50 MCG tablet Take 1 tablet by mouth  daily before breakfast  . triamcinolone cream (KENALOG) 0.1 % Apply 1 application topically 2 (two) times daily.   No Known Allergies Patient Active Problem   List   Diagnosis Date Noted  . Depression, major, in remission 03/30/2014  . Bacteremia   . Ruptured thoracic aortic aneurysm 01/23/2014  . 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 Right ventricular dysfunction 01/23/2014  . Solitary pulmonary nodule 12/08/2013  . Allergic conjunctivitis 10/20/2012  . Syncope 01/22/2012  . Hypothyroidism 01/22/2011  . Essential hypertension, benign 01/22/2011  . GERD (gastroesophageal reflux disease) 01/22/2011  . Iron deficiency anemia 01/22/2011  . IRON DEFICIENCY ANEMIA SECONDARY TO BLOOD LOSS 11/08/2008  . ANEMIA 05/26/2008  .  HYPERTENSION 03/02/2008  . DYSPNEA 03/02/2008   History   Social History  . Marital Status: Married    Spouse Name: N/A  . Number of Children: 2  . Years of Education: N/A   Occupational History  . Not on file.   Social History Main Topics  . Smoking status: Former Smoker    Types: Cigarettes    Quit date: 09/01/1977  . Smokeless tobacco: Never Used  . Alcohol Use: Yes     Comment: 1-2 glasses wine per day  . Drug Use: No  . Sexual Activity: Not on file   Other Topics Concern  . Not on file   Social History Narrative   Lives with wife, pet rats (new)    Mr. Fisher's family history includes Cancer in his brother and brother; Depression in his mother; Diabetes in his mother and sister; Emphysema in his brother; Heart disease in his mother; Hypertension in his father and mother; Parkinsonism in his father; Stroke in his father. There is no history of Colon cancer.      Objective:    Filed Vitals:   11/01/14 1401  BP: 142/82  Pulse: 59    Physical Exam  well-developed older white male in no acute distress, accompanied by his daughter blood pressure 142/82 pulse 59 height 5 foot 4 weight 135. HEENT ;nontraumatic normocephalic EOMI PERRLA sclera anicteric, Supple; no JVD, Cardiovascular; regular rate and rhythm with S1-S2 has a large sternal incisional scar, Pulmonary; clear bilaterally, Abdomen; soft nontender nondistended bowel sounds are active there is no palpable mass or hepatosplenomegaly, Rectal; exam not done, Extremities ;no clubbing cyanosis or edema skin warm and dry, Psych; mood and affect appropriate       Assessment & Plan:   #1  81 year old white male with recurrent Cdiff colitis. Currently on his 3rd round of treatment for cdiff, and second course of Vancomycin. Symptoms controlled on vancomycin. #2 hx of ruptured thoracic aneurysm  2015, complicated by endocarditis requiring prolonged antibiotics #3HTN #4 Depression   Plan; Pt is scheduled for FMT  with Dr Pyrtle next week using the [facility] as donor source. Procedure was reviewed in detail  today and prep instructions reviewed as well. Pt has been previously instructed by Dr. Snider when to stop vancomycin. It was carefully explained to pt that the purpose of this exam is to deliver FMT and is not intended to be a surveillance exam for polyp detection or removal.  If polyps are found he will be scheduled at another time for repeat colonoscopy after he has cleared the C..Diff  Infection. He voices understanding.  It was also explained to Pt that FMT is still considered to be experimental , though  largely quite successful at eradicating C. Diff. Pt is agreeable to proceed.  CC; Dr Cynthia Snider   Amy S Esterwood PA-C 11/01/2014   Addendum: Reviewed and agree with management. Dr. Snider will be present on Thursday at the time of colonoscopy   for appropriate consent for fecal microbiota transportation Jay M Pyrtle, MD   

## 2014-11-01 NOTE — Patient Instructions (Signed)
You have been scheduled for a colonoscopy. Please follow written instructions given to you at your visit today.  We have given you the colonoscopy prep sample: Moviprep.  If you use inhalers (even only as needed), please bring them with you on the day of your procedure. Your physician has requested that you go to www.startemmi.com and enter the access code given to you at your visit today. This web site gives a general overview about your procedure. However, you should still follow specific instructions given to you by our office regarding your preparation for the procedure.

## 2014-11-02 DIAGNOSIS — J301 Allergic rhinitis due to pollen: Secondary | ICD-10-CM | POA: Diagnosis not present

## 2014-11-02 DIAGNOSIS — J3089 Other allergic rhinitis: Secondary | ICD-10-CM | POA: Diagnosis not present

## 2014-11-08 DIAGNOSIS — J301 Allergic rhinitis due to pollen: Secondary | ICD-10-CM | POA: Diagnosis not present

## 2014-11-08 DIAGNOSIS — J3089 Other allergic rhinitis: Secondary | ICD-10-CM | POA: Diagnosis not present

## 2014-11-08 NOTE — Anesthesia Preprocedure Evaluation (Addendum)
Anesthesia Evaluation  Patient identified by MRN, date of birth, ID band Patient awake    Reviewed: Allergy & Precautions, NPO status , Patient's Chart, lab work & pertinent test results  Airway Mallampati: II   Neck ROM: Full    Dental  (+) Edentulous Upper, Edentulous Lower   Pulmonary former smoker,  breath sounds clear to auscultation        Cardiovascular hypertension, Pt. on medications Rhythm:Regular  ECHO 7/2015EF50% mild RV dysfunction   Neuro/Psych Depression    GI/Hepatic GERD-  Medicated,Recurrent C. Diff colitis    Endo/Other  Hypothyroidism   Renal/GU      Musculoskeletal   Abdominal (+)  Abdomen: soft.    Peds  Hematology 14/44   Anesthesia Other Findings   Reproductive/Obstetrics                            Anesthesia Physical Anesthesia Plan  ASA: II  Anesthesia Plan: MAC   Post-op Pain Management:    Induction: Intravenous  Airway Management Planned: Nasal Cannula  Additional Equipment:   Intra-op Plan:   Post-operative Plan:   Informed Consent: I have reviewed the patients History and Physical, chart, labs and discussed the procedure including the risks, benefits and alternatives for the proposed anesthesia with the patient or authorized representative who has indicated his/her understanding and acceptance.     Plan Discussed with:   Anesthesia Plan Comments:         Anesthesia Quick Evaluation

## 2014-11-09 ENCOUNTER — Encounter (HOSPITAL_COMMUNITY)
Admission: RE | Disposition: A | Discharge status: Home / Self Care | Payer: Medicare Other | Source: Ambulatory Visit | Attending: Internal Medicine

## 2014-11-09 ENCOUNTER — Ambulatory Visit (HOSPITAL_COMMUNITY): Payer: Medicare Other | Admitting: Anesthesiology

## 2014-11-09 ENCOUNTER — Encounter (HOSPITAL_COMMUNITY): Payer: Self-pay | Admitting: *Deleted

## 2014-11-09 ENCOUNTER — Ambulatory Visit (HOSPITAL_COMMUNITY)
Admission: RE | Admit: 2014-11-09 | Discharge: 2014-11-09 | Disposition: A | Discharge status: Home / Self Care | Payer: Medicare Other | Source: Ambulatory Visit | Attending: Internal Medicine | Admitting: Internal Medicine

## 2014-11-09 DIAGNOSIS — D509 Iron deficiency anemia, unspecified: Secondary | ICD-10-CM | POA: Diagnosis not present

## 2014-11-09 DIAGNOSIS — A047 Enterocolitis due to Clostridium difficile: Secondary | ICD-10-CM | POA: Diagnosis not present

## 2014-11-09 DIAGNOSIS — I10 Essential (primary) hypertension: Secondary | ICD-10-CM | POA: Diagnosis not present

## 2014-11-09 DIAGNOSIS — Z7982 Long term (current) use of aspirin: Secondary | ICD-10-CM | POA: Insufficient documentation

## 2014-11-09 DIAGNOSIS — Z79899 Other long term (current) drug therapy: Secondary | ICD-10-CM | POA: Insufficient documentation

## 2014-11-09 DIAGNOSIS — Z7952 Long term (current) use of systemic steroids: Secondary | ICD-10-CM | POA: Insufficient documentation

## 2014-11-09 DIAGNOSIS — K219 Gastro-esophageal reflux disease without esophagitis: Secondary | ICD-10-CM | POA: Insufficient documentation

## 2014-11-09 DIAGNOSIS — Z792 Long term (current) use of antibiotics: Secondary | ICD-10-CM | POA: Diagnosis not present

## 2014-11-09 DIAGNOSIS — K5730 Diverticulosis of large intestine without perforation or abscess without bleeding: Secondary | ICD-10-CM | POA: Insufficient documentation

## 2014-11-09 DIAGNOSIS — K644 Residual hemorrhoidal skin tags: Secondary | ICD-10-CM | POA: Diagnosis not present

## 2014-11-09 DIAGNOSIS — Z87891 Personal history of nicotine dependence: Secondary | ICD-10-CM | POA: Diagnosis not present

## 2014-11-09 DIAGNOSIS — E039 Hypothyroidism, unspecified: Secondary | ICD-10-CM | POA: Diagnosis not present

## 2014-11-09 DIAGNOSIS — A0471 Enterocolitis due to Clostridium difficile, recurrent: Secondary | ICD-10-CM

## 2014-11-09 DIAGNOSIS — F325 Major depressive disorder, single episode, in full remission: Secondary | ICD-10-CM | POA: Diagnosis not present

## 2014-11-09 DIAGNOSIS — K648 Other hemorrhoids: Secondary | ICD-10-CM | POA: Insufficient documentation

## 2014-11-09 HISTORY — DX: Reserved for inherently not codable concepts without codable children: IMO0001

## 2014-11-09 HISTORY — PX: COLONOSCOPY WITH PROPOFOL: SHX5780

## 2014-11-09 HISTORY — PX: FECAL TRANSPLANT: SHX6383

## 2014-11-09 SURGERY — COLONOSCOPY WITH PROPOFOL
Anesthesia: Monitor Anesthesia Care

## 2014-11-09 MED ORDER — PROPOFOL 10 MG/ML IV BOLUS
INTRAVENOUS | Status: AC
  Filled 2014-11-09: qty 20

## 2014-11-09 MED ORDER — SODIUM CHLORIDE 0.9 % IV SOLN: INTRAVENOUS | Status: DC

## 2014-11-09 MED ORDER — LIDOCAINE HCL (CARDIAC) 20 MG/ML IV SOLN
INTRAVENOUS | Status: DC | PRN
  Administered 2014-11-09: 75 mg via INTRAVENOUS

## 2014-11-09 MED ORDER — MEPERIDINE HCL 100 MG/ML IJ SOLN: 6.2500 mg | INTRAMUSCULAR | Status: DC | PRN

## 2014-11-09 MED ORDER — PROPOFOL 10 MG/ML IV BOLUS
INTRAVENOUS | Status: DC | PRN
  Administered 2014-11-09: 20 mg via INTRAVENOUS
  Administered 2014-11-09: 10 mg via INTRAVENOUS
  Administered 2014-11-09: 20 mg via INTRAVENOUS

## 2014-11-09 MED ORDER — LIDOCAINE HCL (CARDIAC) 20 MG/ML IV SOLN
INTRAVENOUS | Status: AC
  Filled 2014-11-09: qty 5

## 2014-11-09 MED ORDER — PROMETHAZINE HCL 25 MG/ML IJ SOLN: 6.2500 mg | INTRAMUSCULAR | Status: DC | PRN

## 2014-11-09 MED ORDER — FENTANYL CITRATE 0.05 MG/ML IJ SOLN: 25.0000 ug | INTRAMUSCULAR | Status: DC | PRN

## 2014-11-09 MED ORDER — PROPOFOL INFUSION 10 MG/ML OPTIME
INTRAVENOUS | Status: DC | PRN
  Administered 2014-11-09: 140 ug/kg/min via INTRAVENOUS

## 2014-11-09 MED ORDER — LACTATED RINGERS IV SOLN
INTRAVENOUS | Status: DC
  Administered 2014-11-09: 13:00:00 via INTRAVENOUS

## 2014-11-09 SURGICAL SUPPLY — 21 items

## 2014-11-09 NOTE — Interval H&P Note (Signed)
History and Physical Interval Note: Here for colonoscopy with FMT for recurrent c diff Experimental treatment which he understands Followed and managed by Dr. Baxter Flattery The nature of the procedure, as well as the risks, benefits, and alternatives were carefully and thoroughly reviewed with the patient. Ample time for discussion and questions allowed. The patient understood, was satisfied, and agreed to proceed.    11/09/2014 1:12 PM  Danny Frederick  has presented today for surgery, with the diagnosis of recurrent C diff  The various methods of treatment have been discussed with the patient and family. After consideration of risks, benefits and other options for treatment, the patient has consented to  Procedure(s): COLONOSCOPY WITH PROPOFOL (N/A) FECAL TRANSPLANT (N/A) as a surgical intervention .  The patient's history has been reviewed, patient examined, no change in status, stable for surgery.  I have reviewed the patient's chart and labs.  Questions were answered to the patient's satisfaction.     PYRTLE, JAY M

## 2014-11-09 NOTE — Discharge Instructions (Addendum)
YOU HAD AN ENDOSCOPIC PROCEDURE TODAY: Refer to the procedure report that was given to you for any specific questions about what was found during the examination.  If the procedure report does not answer your questions, please call your gastroenterologist to clarify. ° °YOU SHOULD EXPECT: Some feelings of bloating in the abdomen. Passage of more gas than usual.  Walking can help get rid of the air that was put into your GI tract during the procedure and reduce the bloating. If you had a lower endoscopy (such as a colonoscopy or flexible sigmoidoscopy) you may notice spotting of blood in your stool or on the toilet paper.  ° °DIET: Your first meal following the procedure should be a light meal and then it is ok to progress to your normal diet.  A half-sandwich or bowl of soup is an example of a good first meal.  Heavy or fried foods are harder to digest and may make you feel nasueas or bloated.  Drink plenty of fluids but you should avoid alcoholic beverages for 24 hours. ° °ACTIVITY: Your care partner should take you home directly after the procedure.  You should plan to take it easy, moving slowly for the rest of the day.  You can resume normal activity the day after the procedure however you should NOT DRIVE or use heavy machinery for 24 hours (because of the sedation medicines used during the test).   ° °SYMPTOMS TO REPORT IMMEDIATELY  °A gastroenterologist can be reached at any hour.  Please call your doctor's office for any of the following symptoms: ° °· Following lower endoscopy (colonoscopy, flexible sigmoidoscopy) ° Excessive amounts of blood in the stool ° Significant tenderness, worsening of abdominal pains ° Swelling of the abdomen that is new, acute ° Fever of 100° or higher °· Following upper endoscopy (EGD, EUS, ERCP) ° Vomiting of blood or coffee ground material ° New, significant abdominal pain ° New, significant chest pain or pain under the shoulder blades ° Painful or persistently difficult  swallowing ° New shortness of breath ° Black, tarry-looking stools ° °FOLLOW UP: °If any biopsies were taken you will be contacted by phone or by letter within the next 1-3 weeks.  Call your gastroenterologist if you have not heard about the biopsies in 3 weeks.  °Please also call your gastroenterologist's office with any specific questions about appointments or follow up tests. ° °Conscious Sedation, Adult, Care After °Refer to this sheet in the next few weeks. These instructions provide you with information on caring for yourself after your procedure. Your health care provider may also give you more specific instructions. Your treatment has been planned according to current medical practices, but problems sometimes occur. Call your health care provider if you have any problems or questions after your procedure. °WHAT TO EXPECT AFTER THE PROCEDURE  °After your procedure: °· You may feel sleepy, clumsy, and have poor balance for several hours. °· Vomiting may occur if you eat too soon after the procedure. °HOME CARE INSTRUCTIONS °· Do not participate in any activities where you could become injured for at least 24 hours. Do not: °¨ Drive. °¨ Swim. °¨ Ride a bicycle. °¨ Operate heavy machinery. °¨ Cook. °¨ Use power tools. °¨ Climb ladders. °¨ Work from a high place. °· Do not make important decisions or sign legal documents until you are improved. °· If you vomit, drink water, juice, or soup when you can drink without vomiting. Make sure you have little or no nausea before eating   solid foods. °· Only take over-the-counter or prescription medicines for pain, discomfort, or fever as directed by your health care provider. °· Make sure you and your family fully understand everything about the medicines given to you, including what side effects may occur. °· You should not drink alcohol, take sleeping pills, or take medicines that cause drowsiness for at least 24 hours. °· If you smoke, do not smoke without  supervision. °· If you are feeling better, you may resume normal activities 24 hours after you were sedated. °· Keep all appointments with your health care provider. °SEEK MEDICAL CARE IF: °· Your skin is pale or bluish in color. °· You continue to feel nauseous or vomit. °· Your pain is getting worse and is not helped by medicine. °· You have bleeding or swelling. °· You are still sleepy or feeling clumsy after 24 hours. °SEEK IMMEDIATE MEDICAL CARE IF: °· You develop a rash. °· You have difficulty breathing. °· You develop any type of allergic problem. °· You have a fever. °MAKE SURE YOU: °· Understand these instructions. °· Will watch your condition. °· Will get help right away if you are not doing well or get worse. °Document Released: 06/08/2013 Document Reviewed: 06/08/2013 °[employer]® Patient Information ©2015 [employer]. This information is not intended to replace advice given to you by your health care provider. Make sure you discuss any questions you have with your health care provider. ° °

## 2014-11-09 NOTE — Anesthesia Postprocedure Evaluation (Signed)
  Anesthesia Post-op Note  Patient: Danny Frederick  Procedure(s) Performed: Procedure(s): COLONOSCOPY WITH PROPOFOL (N/A) FECAL TRANSPLANT (N/A)  Patient Location: PACU  Anesthesia Type:MAC  Level of Consciousness: awake, alert  and oriented  Airway and Oxygen Therapy: Patient Spontanous Breathing and Patient connected to nasal cannula oxygen  Post-op Pain: none  Post-op Assessment: Post-op Vital signs reviewed  Post-op Vital Signs: Reviewed and stable  Last Vitals:  Filed Vitals:   11/09/14 1430  BP: 173/107  Pulse:   Temp:   Resp: 20    Complications: No apparent anesthesia complications

## 2014-11-09 NOTE — H&P (View-Only) (Signed)
Patient ID: Danny Frederick, male   DOB: 10/04/1933, 81 y.o.   MRN: [id number]   Subjective:    Patient ID: Danny Frederick, male    DOB: 10/19/1933, 81 y.o.   MRN: [id number]  HPI Danny Frederick is a pleasant 81-year-old white male referred to Dr.Pyrtle by Dr. Cynthia Snider for fecal microbial transplant  In the setting of recurrent C. difficile colitis. Patient comes to the office today to discuss .. Patient had been seen previously in the office by Dr. Jacobs and had undergone workup in 2010 for an anemia and heme positive stool. He had capsule endoscopy which was negative and colonoscopy showed mild diverticulosis in the sigmoid colon, and internal hemorrhoids EGD was negative. . Patient initially developed C. difficile infection after a prolonged hospitalization in May 2015 with a ruptured thoracic aortic aneurysm complicated by strep pneumonia a endocarditis. He required prolonged IV antibiotics and then amoxicillin for suppression through the followed 2015. He developed profuse diarrhea in December 2015 and was diagnosed with C. difficile. He was initially treated with a course of Flagyl and responded , however within a few days of coming off antibiotics he had recurrence of diarrhea. He was diagnosed with recurrent C. difficile by stool for PCR on 09/07/2014. At that time he was given a course of oral vancomycin 125 mg by mouth 4 times daily in addition to probiotics. He finished this and felt better. He had seen Dr. Cynthia Snider for in early February 2016 just to discuss his options should he have a recurrence.  He did well for about 5 weeks and then had onset of diarrhea again at the end of February. He is now back on vancomycin 125 mg 4 times daily. He says he responded very quickly in his stools are about back to normal. He did not have any associated fever chills nausea vomiting abdominal pain just developed recurrent diarrhea. He says he is back to having one to 2 bowel movements per day.  At this time  he has been set up for a fecal  Microbial transplant with plans to use the [facility] as his donor source. He is scheduled for the procedure on March 10 with Dr.Pyrtle.   Review of Systems Pertinent positive and negative review of systems were noted in the above HPI section.  All other review of systems was otherwise negative.  Outpatient Encounter Prescriptions as of 11/01/2014  Medication Sig  . aspirin 81 MG tablet Take 81 mg by mouth at bedtime.   . citalopram (CELEXA) 20 MG tablet Take 1 tablet (20 mg total) by mouth daily.  . Ferrous Sulfate (IRON) 325 (65 FE) MG TABS Take 1 tablet by mouth every other day.  . lisinopril (PRINIVIL,ZESTRIL) 20 MG tablet Take 1 tablet (20 mg total) by mouth daily.  . loratadine (CLARITIN) 10 MG tablet Take 10 mg by mouth daily.  . metoprolol (LOPRESSOR) 50 MG tablet Take 1 tablet (50 mg total) by mouth 2 (two) times daily.  . MOVIPREP 100 G SOLR Take 1 kit (200 g total) by mouth once.  . mupirocin nasal ointment (BACTROBAN NASAL) 2 % Apply to affected area of nose 2-3 times each day for a week  . omeprazole (PRILOSEC) 20 MG capsule Take 1 capsule by mouth  every other day  . SYNTHROID 50 MCG tablet Take 1 tablet by mouth  daily before breakfast  . triamcinolone cream (KENALOG) 0.1 % Apply 1 application topically 2 (two) times daily.   No Known Allergies Patient Active Problem   List   Diagnosis Date Noted  . Depression, major, in remission 03/30/2014  . Bacteremia   . Ruptured thoracic aortic aneurysm 01/23/2014  . 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 Right ventricular dysfunction 01/23/2014  . Solitary pulmonary nodule 12/08/2013  . Allergic conjunctivitis 10/20/2012  . Syncope 01/22/2012  . Hypothyroidism 01/22/2011  . Essential hypertension, benign 01/22/2011  . GERD (gastroesophageal reflux disease) 01/22/2011  . Iron deficiency anemia 01/22/2011  . IRON DEFICIENCY ANEMIA SECONDARY TO BLOOD LOSS 11/08/2008  . ANEMIA 05/26/2008  .  HYPERTENSION 03/02/2008  . DYSPNEA 03/02/2008   History   Social History  . Marital Status: Married    Spouse Name: N/A  . Number of Children: 2  . Years of Education: N/A   Occupational History  . Not on file.   Social History Main Topics  . Smoking status: Former Smoker    Types: Cigarettes    Quit date: 09/01/1977  . Smokeless tobacco: Never Used  . Alcohol Use: Yes     Comment: 1-2 glasses wine per day  . Drug Use: No  . Sexual Activity: Not on file   Other Topics Concern  . Not on file   Social History Narrative   Lives with wife, pet rats (new)    Mr. Lopp family history includes Cancer in his brother and brother; Depression in his mother; Diabetes in his mother and sister; Emphysema in his brother; Heart disease in his mother; Hypertension in his father and mother; Parkinsonism in his father; Stroke in his father. There is no history of Colon cancer.      Objective:    Filed Vitals:   11/01/14 1401  BP: 142/82  Pulse: 59    Physical Exam  well-developed 81-year-old white male in no acute distress, accompanied by his daughter blood pressure 142/82 pulse 59 height 5 foot 4 weight 135. HEENT ;nontraumatic normocephalic EOMI PERRLA sclera anicteric, Supple; no JVD, Cardiovascular; regular rate and rhythm with S1-S2 has a large sternal incisional scar, Pulmonary; clear bilaterally, Abdomen; soft nontender nondistended bowel sounds are active there is no palpable mass or hepatosplenomegaly, Rectal; exam not done, Extremities ;no clubbing cyanosis or edema skin warm and dry, Psych; mood and affect appropriate       Assessment & Plan:   #41  81 year old white male with recurrent Cdiff colitis. Currently on his 3rd round of treatment for cdiff, and second course of Vancomycin. Symptoms controlled on vancomycin. #2 hx of ruptured thoracic aneurysm  1194, complicated by endocarditis requiring prolonged antibiotics #3HTN #4 Depression   Plan; Pt is scheduled for FMT  with Dr Hilarie Fredrickson next week using the [facility] as donor source. Procedure was reviewed in detail  today and prep instructions reviewed as well. Pt has been previously instructed by Dr. Baxter Flattery when to stop vancomycin. It was carefully explained to pt that the purpose of this exam is to deliver FMT and is not intended to be a surveillance exam for polyp detection or removal.  If polyps are found he will be scheduled at another time for repeat colonoscopy after he has cleared the C..Diff  Infection. He voices understanding.  It was also explained to Pt that FMT is still considered to be experimental , though  largely quite successful at eradicating C. Diff. Pt is agreeable to proceed.  CC; Dr Carlyle Basques   Amy S Esterwood PA-C 11/01/2014   Addendum: Reviewed and agree with management. Dr. Baxter Flattery will be present on Thursday at the time of colonoscopy  for appropriate consent for fecal microbiota transportation Jay M Pyrtle, MD   

## 2014-11-09 NOTE — Transfer of Care (Signed)
Immediate Anesthesia Transfer of Care Note  Patient: Danny Frederick  Procedure(s) Performed: Procedure(s): COLONOSCOPY WITH PROPOFOL (N/A) FECAL TRANSPLANT (N/A)  Patient Location: PACU and Endoscopy Unit  Anesthesia Type:MAC  Level of Consciousness: awake, sedated and responds to stimulation  Airway & Oxygen Therapy: Patient Spontanous Breathing and Patient connected to face mask oxygen  Post-op Assessment: Report given to RN and Post -op Vital signs reviewed and stable  Post vital signs: Reviewed and stable  Last Vitals:  Filed Vitals:   11/09/14 1236  BP: 201/85  Pulse: 77  Temp: 36.4 C  Resp: 15    Complications: No apparent anesthesia complications

## 2014-11-09 NOTE — Op Note (Signed)
[facility] [place], [address]   COLONOSCOPY PROCEDURE REPORT  PATIENT: Danny Frederick, Danny Frederick  MR#: [id number] BIRTHDATE: 1934-06-02 , 55  yrs. old GENDER: male ENDOSCOPIST: Jerene Bears, MD REFERRED DS:[id number] Baxter Flattery, M.D. PROCEDURE DATE:  11/09/2014 PROCEDURE:   Colonoscopy, diagnostic, Fecal microbiota transplantation First Screening Colonoscopy - Avg.  risk and is 50 yrs.  old or older - No.  Prior Negative Screening - Now for repeat screening. N/A  History of Adenoma - Now for follow-up colonoscopy & has been > or = to 3 yrs.  N/A ASA CLASS:   Class III INDICATIONS:recurrent c.  difficile colitis for fecal microbiota transplantation. MEDICATIONS: Monitored anesthesia care and Per Anesthesia  DESCRIPTION OF PROCEDURE:   After the risks benefits and alternatives of the procedure were thoroughly explained, informed consent was obtained.  The digital rectal exam revealed a skin tag. The Pentax Ped Colon [id number]  endoscope was introduced through the anus and advanced to the terminal ileum which was intubated for a short distance. No adverse events experienced.   The quality of the prep was (MoviPrep was used) adequate  The instrument was then slowly withdrawn as the colon was fully examined.  COLON FINDINGS: The examined terminal ileum appeared to be normal. The colonic mucosa appeared normal throughout the entire examined colon though due to the nature of this procedure polyp screening/surveillance was not performed. No large lesions seen.. There was mild diverticulosis noted in the sigmoid colon.  200-225 mL's of donor liquid stool transplanted into the terminal ileum without complication.  Retroflexed views revealed internal hemorrhoids and skin tag.     The scope was withdrawn and the procedure completed.  COMPLICATIONS: There were no immediate complications.  ENDOSCOPIC IMPRESSION: 1.   The examined terminal ileum appeared to be normal 2.    The colonic mucosa appeared grossly normal throughout the entire examined colon 3.   Mild diverticulosis was noted in the sigmoid colon 4.   Successful fecal microbiota transplant into the terminal ileum  RECOMMENDATIONS: 1.  Observe off vancomycin 2.  Follow-up with Dr. Baxter Flattery as scheduled and with GI as needed  eSigned:  Jerene Bears, MD 11/09/2014 2:22 PM   cc: The patient, Dr. Baxter Flattery, MD

## 2014-11-10 ENCOUNTER — Encounter (HOSPITAL_COMMUNITY): Payer: Self-pay | Admitting: Internal Medicine

## 2014-11-15 DIAGNOSIS — J3089 Other allergic rhinitis: Secondary | ICD-10-CM | POA: Diagnosis not present

## 2014-11-15 DIAGNOSIS — J301 Allergic rhinitis due to pollen: Secondary | ICD-10-CM | POA: Diagnosis not present

## 2014-11-22 DIAGNOSIS — J3089 Other allergic rhinitis: Secondary | ICD-10-CM | POA: Diagnosis not present

## 2014-11-22 DIAGNOSIS — J301 Allergic rhinitis due to pollen: Secondary | ICD-10-CM | POA: Diagnosis not present

## 2014-11-28 ENCOUNTER — Other Ambulatory Visit: Payer: Self-pay | Admitting: Family Medicine

## 2014-11-29 DIAGNOSIS — J301 Allergic rhinitis due to pollen: Secondary | ICD-10-CM | POA: Diagnosis not present

## 2014-12-06 DIAGNOSIS — J3089 Other allergic rhinitis: Secondary | ICD-10-CM | POA: Diagnosis not present

## 2014-12-06 DIAGNOSIS — J301 Allergic rhinitis due to pollen: Secondary | ICD-10-CM | POA: Diagnosis not present

## 2014-12-12 ENCOUNTER — Ambulatory Visit (INDEPENDENT_AMBULATORY_CARE_PROVIDER_SITE_OTHER): Payer: Medicare Other | Admitting: Internal Medicine

## 2014-12-12 ENCOUNTER — Encounter: Payer: Self-pay | Admitting: Internal Medicine

## 2014-12-12 VITALS — BP 168/75 | HR 65 | Temp 97.7°F | Wt 138.0 lb

## 2014-12-12 DIAGNOSIS — R197 Diarrhea, unspecified: Secondary | ICD-10-CM | POA: Diagnosis present

## 2014-12-12 MED ORDER — METRONIDAZOLE 500 MG PO TABS: 500.0000 mg | ORAL_TABLET | Freq: Three times a day (TID) | ORAL | Status: DC

## 2014-12-12 NOTE — Progress Notes (Signed)
Subjective:    Patient ID: Danny Frederick, male    DOB: [birth year], 81 y.o.   MRN: [id number]  HPI  81yo M with recurrent c.difficile colitis. He underwent fecal transplant on March 10th. He states that he no longer has diarrhea but his bowel habits are not back to his baseline. He states that he has bowel movement once or twice a day. Initially they start out having soft consistency, occasionally soupy, then the rest of the stool is formed. He denies having abd cramping or increase flatulence. He is gaining weight and has good appetite. His friends nad family have commented that he looks better. He continues to go to the gym 3 x per week. He no longer needs to take mid afternoon naps. He is also has weaned himself off of his ssri and has not noticed any change in his moods.  He will be going to [place] on may 4th for a 3 week trip visiting family. He is worried about having a relapse in cdifficile infection when he is abroad  Current Outpatient Prescriptions on File Prior to Visit  Medication Sig Dispense Refill  . aspirin 81 MG tablet Take 81 mg by mouth at bedtime.     . citalopram (CELEXA) 20 MG tablet Take 1 tablet (20 mg total) by mouth daily. 90 tablet 1  . Ferrous Sulfate (IRON) 325 (65 FE) MG TABS Take 1 tablet by mouth every other day.    . lisinopril (PRINIVIL,ZESTRIL) 20 MG tablet Take 1 tablet (20 mg total) by mouth daily. 90 tablet 3  . loratadine (CLARITIN) 10 MG tablet Take 10 mg by mouth daily.    . metoprolol (LOPRESSOR) 50 MG tablet Take 1 tablet (50 mg total) by mouth 2 (two) times daily. 180 tablet 1  . MOVIPREP 100 G SOLR Take 1 kit (200 g total) by mouth once. 1 kit 0  . omeprazole (PRILOSEC) 20 MG capsule Take 1 capsule by mouth  every other day 45 capsule 3  . SYNTHROID 50 MCG tablet Take 1 tablet by mouth  daily before breakfast 90 tablet 0  . triamcinolone cream (KENALOG) 0.1 % Apply 1 application topically 2 (two) times daily. 454 g 0   No current  facility-administered medications on file prior to visit.   Active Ambulatory Problems    Diagnosis Date Noted  . IRON DEFICIENCY ANEMIA SECONDARY TO BLOOD LOSS 11/08/2008  . ANEMIA 05/26/2008  . HYPERTENSION 03/02/2008  . DYSPNEA 03/02/2008  . Hypothyroidism 01/22/2011  . Essential hypertension, benign 01/22/2011  . GERD (gastroesophageal reflux disease) 01/22/2011  . Iron deficiency anemia 01/22/2011  . Syncope 01/22/2012  . Allergic conjunctivitis 10/20/2012  . Solitary pulmonary nodule 12/08/2013  . Ruptured thoracic aortic aneurysm 01/23/2014  . 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 Right ventricular dysfunction 01/23/2014  . Bacteremia   . Depression, major, in remission 03/30/2014  . Recurrent Clostridium difficile diarrhea    Resolved Ambulatory Problems    Diagnosis Date Noted  . Internal hemorrhoids 09/23/2012  . Pulmonary embolus 01/23/2014   Past Medical History  Diagnosis Date  . Hypertension   . Unspecified hypothyroidism   . Mitral valve prolapse   . Foot drop, right   . BPH (benign prostatic hypertrophy)   . Iron deficiency anemia, unspecified 6/09  . Hearing loss in left ear   . Diverticulosis of colon 12/09  . Allergic rhinitis, cause unspecified   . Hx of echocardiogram   . History of Clostridium difficile  Review of Systems Review of Systems  Constitutional: Negative for fever, chills, diaphoresis, activity change, appetite change, fatigue and unexpected weight change.  HENT: Negative for congestion, sore throat, rhinorrhea, sneezing, trouble swallowing and sinus pressure.  Eyes: Negative for photophobia and visual disturbance.  Respiratory: Negative for cough, chest tightness, shortness of breath, wheezing and stridor.  Cardiovascular: Negative for chest pain, palpitations and leg swelling.  Gastrointestinal: Negative for nausea, vomiting, abdominal pain, diarrhea, constipation, blood in stool, abdominal distention and anal bleeding.    Genitourinary: Negative for dysuria, hematuria, flank pain and difficulty urinating.  Musculoskeletal: Negative for myalgias, back pain, joint swelling, arthralgias and gait problem.  Skin: Negative for color change, pallor, rash and wound.  Neurological: Negative for dizziness, tremors, weakness and light-headedness.  Hematological: Negative for adenopathy. Does not bruise/bleed easily.  Psychiatric/Behavioral: Negative for behavioral problems, confusion, sleep disturbance, dysphoric mood, decreased concentration and agitation.       Objective:   Physical Exam There were no vitals taken for this visit. No exam        Assessment & Plan:  Recurrent cdi = appears to have for the time being, early response to FMT. We will call him back in 30 days to assess him at 60 days post FMT to see how he is doing, hopefully remains cdifficile negative. Recommend that he avoids any unnecessary antibiotics. Encourage to have his pcp call us if needs help with abtx choices  Gave him prescription of flagyl 577m TID to use if needed if 3+ BM per day. Asked him to email me if he is to initiate it and will recommend to get tested for cdifficile/diarrhea in [place].

## 2014-12-13 DIAGNOSIS — J301 Allergic rhinitis due to pollen: Secondary | ICD-10-CM | POA: Diagnosis not present

## 2014-12-13 DIAGNOSIS — J3089 Other allergic rhinitis: Secondary | ICD-10-CM | POA: Diagnosis not present

## 2014-12-20 ENCOUNTER — Other Ambulatory Visit: Payer: Self-pay | Admitting: Family Medicine

## 2014-12-20 DIAGNOSIS — J301 Allergic rhinitis due to pollen: Secondary | ICD-10-CM | POA: Diagnosis not present

## 2014-12-21 ENCOUNTER — Other Ambulatory Visit: Payer: Self-pay | Admitting: *Deleted

## 2014-12-21 ENCOUNTER — Telehealth: Payer: Self-pay | Admitting: Family Medicine

## 2014-12-21 ENCOUNTER — Encounter: Payer: Self-pay | Admitting: Thoracic Surgery (Cardiothoracic Vascular Surgery)

## 2014-12-21 DIAGNOSIS — I711 Thoracic aortic aneurysm, ruptured, unspecified: Secondary | ICD-10-CM

## 2014-12-21 DIAGNOSIS — I10 Essential (primary) hypertension: Secondary | ICD-10-CM

## 2014-12-21 MED ORDER — LISINOPRIL 20 MG PO TABS: 20.0000 mg | ORAL_TABLET | Freq: Every day | ORAL | Status: DC

## 2014-12-21 NOTE — Telephone Encounter (Signed)
[facility] req refill for Lisinopril HCTZ 20-12.5

## 2014-12-21 NOTE — Telephone Encounter (Signed)
He is due for med check in 3 mos and not yet scheduled.  Okay to refill 90d, and schedule med check. Thanks

## 2014-12-21 NOTE — Telephone Encounter (Signed)
Done

## 2014-12-27 DIAGNOSIS — J3089 Other allergic rhinitis: Secondary | ICD-10-CM | POA: Diagnosis not present

## 2014-12-27 DIAGNOSIS — J301 Allergic rhinitis due to pollen: Secondary | ICD-10-CM | POA: Diagnosis not present

## 2015-01-03 ENCOUNTER — Other Ambulatory Visit: Payer: Self-pay | Admitting: *Deleted

## 2015-01-03 DIAGNOSIS — J301 Allergic rhinitis due to pollen: Secondary | ICD-10-CM | POA: Diagnosis not present

## 2015-01-03 DIAGNOSIS — J3089 Other allergic rhinitis: Secondary | ICD-10-CM | POA: Diagnosis not present

## 2015-01-03 MED ORDER — METOPROLOL TARTRATE 50 MG PO TABS: ORAL_TABLET | ORAL | Status: DC

## 2015-01-22 ENCOUNTER — Ambulatory Visit: Payer: Medicare Other | Admitting: Thoracic Surgery (Cardiothoracic Vascular Surgery)

## 2015-01-22 ENCOUNTER — Inpatient Hospital Stay: Admission: RE | Admit: 2015-01-22 | Payer: Self-pay | Source: Ambulatory Visit

## 2015-01-24 DIAGNOSIS — J301 Allergic rhinitis due to pollen: Secondary | ICD-10-CM | POA: Diagnosis not present

## 2015-01-24 DIAGNOSIS — J3089 Other allergic rhinitis: Secondary | ICD-10-CM | POA: Diagnosis not present

## 2015-01-31 ENCOUNTER — Ambulatory Visit (INDEPENDENT_AMBULATORY_CARE_PROVIDER_SITE_OTHER): Payer: Medicare Other | Admitting: Family Medicine

## 2015-01-31 ENCOUNTER — Encounter: Payer: Self-pay | Admitting: Family Medicine

## 2015-01-31 VITALS — BP 180/90 | HR 56 | Ht 62.75 in | Wt 142.4 lb

## 2015-01-31 DIAGNOSIS — M7582 Other shoulder lesions, left shoulder: Secondary | ICD-10-CM

## 2015-01-31 MED ORDER — NAPROXEN 500 MG PO TABS: 500.0000 mg | ORAL_TABLET | Freq: Two times a day (BID) | ORAL | Status: DC

## 2015-01-31 NOTE — Patient Instructions (Addendum)
  Rotator cuff tendonitis vs tear (on the left). Evidence of old rotator cuff tear on the right.  Treat with naproxen twice daily with food for up to 2 weeks. You may take tylenol along with this, if needed for pain.  Take your prescribed anti-inflammatory medication with food; discontinue or cut back the dose if you develop stomach pain/discomfort/side effects.  Do not take other over-the-counter pain medications such as ibuprofen, advil, motrin, aleve, naproxen, Goody or BC powder at the same time.  Do not use longer than recommended.  It is okay to use acetaminophen (tylenol) along with this medication.   Follow up with Dr. Onnie Graham if ongoing pain or weakness.   Continue to monitor your blood pressure, as it was high in the office.  Goal is <130-135/80-85

## 2015-01-31 NOTE — Progress Notes (Signed)
Chief Complaint  Patient presents with  . Neck Pain    B/L neck and shoulder pain x 1 month. Left is worse than right currently. Had rotator cuff surgery years ago.    A month ago he started with pain in both upper back/shoulder areas.   The right side has improved some, but the left still has decreased range of motion.  Pain was acute in onset, thinks he might have done something at the gym.  Pain back then felt "like a tear"--he had trouble raising his left arm over his head, but not the pain, just "couldn't do it".  Since then, he has developed pain, and has continued trouble with ROM.    He has some discomfort on the right side as well--mostly just the weakness, not true pain/discomfort per patient. He can't lift weight with either arm, even just a gallon of milk out of the refrigerator.  He had been using 5# weights doing arm/shoulder exercises--didn't increase the weight, change the routine, or feel any acute pain while doing the lifting.   He has been in [place] for the last 3 weeks, so hasn't been lifting any weights.  Hasn't improved with the rest.    He has taken tylenol just occasionally, which helped when he took it, mostly took it at night, so avoid pain while falling asleep.  Heat helps temporarily; has tried Production assistant, radio, which didn't help much.  Hasn't tried topical creams/medications.  He had surgery on rotator cuff on the left, with some bone spurs also removed by Dr. Onnie Graham during that surgery.  He reports that the pain then was similar to his pain now, and that pain completely resolved after surgery. He recalls having a history of a rotator cuff tear on the right as well (that wasn't treated/repaired)  Only a couple of times he noticed some tingling in his right fingertips, none on the left.    Pt reports that his BP's at home run 130's-145/80's, and that it is high only at doctors visits. Currently his pain is 5/10.  PMH, PSH, SH reviewed.  Outpatient Encounter  Prescriptions as of 01/31/2015  Medication Sig  . aspirin 81 MG tablet Take 81 mg by mouth at bedtime.   . Ferrous Sulfate (IRON) 325 (65 FE) MG TABS Take 1 tablet by mouth every other day.  . lisinopril (PRINIVIL,ZESTRIL) 20 MG tablet Take 1 tablet (20 mg total) by mouth daily.  Marland Kitchen loratadine (CLARITIN) 10 MG tablet Take 10 mg by mouth daily.  . metoprolol (LOPRESSOR) 50 MG tablet Take 1 tablet by mouth  twice a day  . omeprazole (PRILOSEC) 20 MG capsule Take 1 capsule by mouth  every other day  . SYNTHROID 50 MCG tablet Take 1 tablet by mouth  daily before breakfast  . triamcinolone cream (KENALOG) 0.1 % Apply 1 application topically 2 (two) times daily. (Patient not taking: Reported on 01/31/2015)  . [DISCONTINUED] metroNIDAZOLE (FLAGYL) 500 MG tablet Take 1 tablet (500 mg total) by mouth 3 (three) times daily. If needed for 3 loose stools or more in 24hr. pls call if you start taking   No facility-administered encounter medications on file as of 01/31/2015.   No Known Allergies  ROS:  No fevers, chills, URI symptoms, cough, shortness of breath, chest pain.  Denies GI problems--no further diarrhea since fecal transplant. See HPI  PHYSICAL EXAM: BP 180/90 mmHg  Pulse 56  Ht 5' 2.75" (1.594 m)  Wt 142 lb 6.4 oz (64.592 kg)  BMI 25.42 kg/m2  Well developed, pleasant elderly male in no distress, some discomfort when moving left arm.  Pain (and therefore weakness) with external rotation against resistance on the left. Some weakness noted with external rotation on the right as well, but denies pain. supraspinatous testing--some weakness noted on the right. Full strength, mild discomfort on the left. No pain or weakness with subscapularis testing Decreased ROM with internal rotation Painful ROM on left with abduction, ultimately can get his hands together over his head FROM with forward flexion, less painful than with abduction Some crepitus noted on the left scapular area and surrounding  muscles with shoulder movement. No focal tenderness, but area of discomfort is on the superolateral portion of the scapula. No muscle spasm noted.  Neuro: alert and oriented. Memory intact. Normal strength, sensation, DTR's. Skin: no rashes, bruising, normal turgor Extremities: no edema   ASSESSMENT/PLAN:  Rotator cuff tendinitis, left - Plan: naproxen (NAPROSYN) 500 MG tablet   Rotator cuff tendonitis vs tear (on the left). Evidence of old rotator cuff tear on the right. Trial of NSAIDs--risks/precautions/side effects reviewed. Follow up with Dr. Onnie Graham if ongoing pain or weakness.  Treat with naproxen twice daily with food for up to 2 weeks. You may take tylenol along with this, if needed for pain.  Elevated BP--continue to monitor at home, return if persistently elevated.

## 2015-02-01 DIAGNOSIS — J3089 Other allergic rhinitis: Secondary | ICD-10-CM | POA: Diagnosis not present

## 2015-02-01 DIAGNOSIS — J301 Allergic rhinitis due to pollen: Secondary | ICD-10-CM | POA: Diagnosis not present

## 2015-02-06 DIAGNOSIS — J301 Allergic rhinitis due to pollen: Secondary | ICD-10-CM | POA: Diagnosis not present

## 2015-02-06 DIAGNOSIS — J3089 Other allergic rhinitis: Secondary | ICD-10-CM | POA: Diagnosis not present

## 2015-02-14 DIAGNOSIS — J3089 Other allergic rhinitis: Secondary | ICD-10-CM | POA: Diagnosis not present

## 2015-02-14 DIAGNOSIS — J301 Allergic rhinitis due to pollen: Secondary | ICD-10-CM | POA: Diagnosis not present

## 2015-02-21 DIAGNOSIS — J3089 Other allergic rhinitis: Secondary | ICD-10-CM | POA: Diagnosis not present

## 2015-02-21 DIAGNOSIS — J301 Allergic rhinitis due to pollen: Secondary | ICD-10-CM | POA: Diagnosis not present

## 2015-02-23 DIAGNOSIS — I711 Thoracic aortic aneurysm, ruptured: Secondary | ICD-10-CM | POA: Diagnosis not present

## 2015-02-24 LAB — CREATININE, SERUM: Creat: 1.1 mg/dL (ref 0.50–1.35)

## 2015-02-24 LAB — BUN: BUN: 19 mg/dL (ref 6–23)

## 2015-02-26 ENCOUNTER — Ambulatory Visit
Admission: RE | Admit: 2015-02-26 | Discharge: 2015-02-26 | Disposition: A | Discharge status: Home / Self Care | Payer: Medicare Other | Source: Ambulatory Visit | Attending: Thoracic Surgery (Cardiothoracic Vascular Surgery) | Admitting: Thoracic Surgery (Cardiothoracic Vascular Surgery)

## 2015-02-26 ENCOUNTER — Encounter: Payer: Self-pay | Admitting: Thoracic Surgery (Cardiothoracic Vascular Surgery)

## 2015-02-26 ENCOUNTER — Ambulatory Visit (INDEPENDENT_AMBULATORY_CARE_PROVIDER_SITE_OTHER): Payer: Medicare Other | Admitting: Thoracic Surgery (Cardiothoracic Vascular Surgery)

## 2015-02-26 VITALS — BP 219/102 | HR 58 | Resp 16 | Ht 62.75 in | Wt 142.0 lb

## 2015-02-26 DIAGNOSIS — I711 Thoracic aortic aneurysm, ruptured, unspecified: Secondary | ICD-10-CM

## 2015-02-26 DIAGNOSIS — I2699 Other pulmonary embolism without acute cor pulmonale: Secondary | ICD-10-CM

## 2015-02-26 DIAGNOSIS — Z9889 Other specified postprocedural states: Secondary | ICD-10-CM | POA: Diagnosis not present

## 2015-02-26 DIAGNOSIS — R911 Solitary pulmonary nodule: Secondary | ICD-10-CM | POA: Diagnosis not present

## 2015-02-26 MED ORDER — POTASSIUM CHLORIDE CRYS ER 10 MEQ PO TBCR: 10.0000 meq | EXTENDED_RELEASE_TABLET | Freq: Every day | ORAL | Status: DC

## 2015-02-26 MED ORDER — FUROSEMIDE 40 MG PO TABS: 40.0000 mg | ORAL_TABLET | Freq: Every day | ORAL | Status: DC

## 2015-02-26 MED ORDER — SILVER SULFADIAZINE 1 % EX CREA: 1.0000 "application " | TOPICAL_CREAM | Freq: Every day | CUTANEOUS | Status: DC

## 2015-02-26 MED ORDER — CEPHALEXIN 500 MG PO CAPS: 500.0000 mg | ORAL_CAPSULE | Freq: Three times a day (TID) | ORAL | Status: DC

## 2015-02-26 MED ORDER — IOPAMIDOL (ISOVUE-370) INJECTION 76%
100.0000 mL | Freq: Once | INTRAVENOUS | Status: AC | PRN
  Administered 2015-02-26: 100 mL via INTRAVENOUS

## 2015-02-26 NOTE — Patient Instructions (Signed)
Call your primary care physician's office TODAY and schedule appointment ASAP to get your blood pressure under better control.  Take an extra dose of lisinopril this afternoon  Check your blood pressure at least once/day and keep a log

## 2015-02-26 NOTE — Progress Notes (Signed)
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  He was last seen here in our office on 05/01/2014 at which time he was doing fairly well. Since then he had problems with recurrent C. Difficile enterocolitis for which he eventually underwent fecal transplant under the care and direction of Dr. Baxter Flattery.  More recently he has had some problems with pain in his left shoulder that has been felt potentially related to shoulder impingement. He returns to our office today stating that he otherwise feels remarkably well. He is back to normal activity. He exercises regularly, at least 4 or 5 days every week. He reports no symptoms of exertional shortness of breath or chest discomfort. He states that he checks his blood pressure at home and it typically remains somewhat elevated, often with systolic blood pressure readings usually between 150 and 180 mmHg.  He has not had any associated symptoms of chest pain, headaches, or dizzy spells. He overall feels quite well physically.   Current Outpatient Prescriptions  Medication Sig Dispense Refill  . aspirin 81 MG tablet Take 81 mg by mouth at bedtime.     . Ferrous Sulfate (IRON) 325 (65 FE) MG TABS Take 1 tablet by mouth every other day.    . lisinopril (PRINIVIL,ZESTRIL) 20 MG tablet Take 1 tablet (20 mg total) by mouth daily. 90 tablet 0  . loratadine (CLARITIN) 10 MG tablet Take 10 mg by mouth daily.    . metoprolol (LOPRESSOR) 50 MG tablet Take 1 tablet by mouth  twice a day 180 tablet 0  . omeprazole (PRILOSEC)  20 MG capsule Take 1 capsule by mouth  every other day 45 capsule 3  . SYNTHROID 50 MCG tablet Take 1 tablet by mouth  daily before breakfast 90 tablet 0   No current facility-administered medications for this visit.      Physical Exam:   BP 219/102 mmHg  Pulse 58  Resp 16  Ht 5' 2.75" (1.594 m)  Wt 142 lb (64.411 kg)  BMI 25.35 kg/m2  SpO2 97%  General:  Well-appearing  Chest:   Clear to auscultation  CV:   Regular rate and rhythm without murmur  Incisions:  Completely healed, sternum is stable  Abdomen:  Soft and nontender  Extremities:  Warm and well-perfused  Diagnostic Tests:  CT ANGIOGRAPHY CHEST WITH CONTRAST  TECHNIQUE: Multidetector CT imaging of the chest was performed using the standard protocol during bolus administration of intravenous contrast. Multiplanar CT image reconstructions and MIPs were obtained to evaluate the vascular anatomy.  CONTRAST: 100 cc Isovue 370  COMPARISON: 05/04/2014  FINDINGS: Pulmonary arteries are well-visualized and appear patent. No significant pulmonary embolus or filling defect by CTA.  Stable mild cardiomegaly.  Stable postoperative appearance of the ascending thoracic aortic graft. Major branch vessels remain patent. Stable postoperative appearance of the mediastinum following the sternotomy. No mediastinal hemorrhage or hematoma.  No pericardial or pleural effusion. Native coronary calcifications noted.  Stable mediastinal adenopathy containing calcifications compatible  with remote granulomatous disease.  Included upper abdomen demonstrates small hiatal hernia with distal esophageal wall thickening. Numerous punctate calcifications in the liver and spleen compatible or with abdominal granulomatous disease. Small upper pole renal cyst. No acute upper abdominal finding.  Lung windows demonstrate similar pattern peripheral subpleural mild fibrotic pattern, worse in the lower lobes. Basilar scarring  also noted. Stable 6 mm left upper lobe nodule, image 66. No superimposed edema, pneumonia, collapse or consolidation. No pneumothorax. Trachea central airways remain patent.  Degenerative changes of the spine. No acute osseous finding. No compression fracture  Review of the MIP images confirms the above findings.  IMPRESSION: Negative for significant acute pulmonary embolus by CTA.  Stable surgical repair of the ascending thoracic aorta with expected postoperative changes.  No acute intra thoracic process  Stable mild subpleural pulmonary fibrotic pattern and left upper lobe 6 mm nodule  Chest and abdominal granulomatous disease.   Electronically Signed  By: Jerilynn Mages. Shick M.D.  On: 02/26/2015 15:15    Impression:  Patient is clinically doing very well 1 year status post emergent repair of contained rupture of mycotic false aneurysm of the ascending thoracic aorta. Late follow-up CT angiogram of the chest looks quite good with intact repair of the thoracic aorta. The patient still has hypertension that remains poorly controlled.  Plan:  I have instructed the patient to taken next dose of lisinopril this afternoon and contact his primary care physician's office to schedule an appointment for tomorrow to address the patient's hypertension. He has been instructed to check his own blood pressure at least once a day and keep a log. He has been advised of the type of symptoms to be concerned about, including the development of sudden onset of severe chest or back pain, headaches, visual disturbances, or other possible neurologic symptoms.  All of his questions have been addressed. He will return in 2 weeks to recheck his blood pressure.  I spent in excess of 15 minutes during the conduct of this office consultation and >50% of this time involved direct face-to-face encounter with the patient for counseling and/or coordination of their care.    Valentina Gu. Roxy Manns,  MD 02/26/2015 5:09 PM

## 2015-02-27 ENCOUNTER — Ambulatory Visit (INDEPENDENT_AMBULATORY_CARE_PROVIDER_SITE_OTHER): Payer: Medicare Other | Admitting: Family Medicine

## 2015-02-27 ENCOUNTER — Encounter: Payer: Self-pay | Admitting: Family Medicine

## 2015-02-27 VITALS — BP 170/96 | HR 56 | Wt 139.6 lb

## 2015-02-27 DIAGNOSIS — I10 Essential (primary) hypertension: Secondary | ICD-10-CM

## 2015-02-27 MED ORDER — LISINOPRIL-HYDROCHLOROTHIAZIDE 20-12.5 MG PO TABS: 1.0000 | ORAL_TABLET | Freq: Every day | ORAL | Status: DC

## 2015-02-27 NOTE — Progress Notes (Signed)
   Subjective:    Patient ID: Danny Frederick, male    DOB: 04/20/1934, 82 y.o.   MRN: [id number]  HPI He is here for consult concerning his blood pressure. He was seen yesterday by Dr. Roxy Manns and noted to have a systolic in the 280 range.He does check his blood pressure at home and apparently his machine is fairly accurate. He did bring nose readings in.  Review of Systems     Objective:   Physical Exam Alert and in no distress. Blood pressure readings were reviewed.       Assessment & Plan:  Essential hypertension, benign - Plan: lisinopril-hydrochlorothiazide (ZESTORETIC) 20-12.5 MG per tablet He does seem to bounce around and lately the pressure has been significant. I will therefore switch him to Zestoretic from plain ice and appropriate he is to return here in one month for follow-up blood pressure. Recommend he check his blood pressure weekly at home and bring that information with him.

## 2015-02-28 DIAGNOSIS — M7542 Impingement syndrome of left shoulder: Secondary | ICD-10-CM | POA: Diagnosis not present

## 2015-02-28 DIAGNOSIS — M12811 Other specific arthropathies, not elsewhere classified, right shoulder: Secondary | ICD-10-CM | POA: Diagnosis not present

## 2015-03-07 DIAGNOSIS — J301 Allergic rhinitis due to pollen: Secondary | ICD-10-CM | POA: Diagnosis not present

## 2015-03-07 DIAGNOSIS — J3089 Other allergic rhinitis: Secondary | ICD-10-CM | POA: Diagnosis not present

## 2015-03-14 DIAGNOSIS — J3089 Other allergic rhinitis: Secondary | ICD-10-CM | POA: Diagnosis not present

## 2015-03-14 DIAGNOSIS — J301 Allergic rhinitis due to pollen: Secondary | ICD-10-CM | POA: Diagnosis not present

## 2015-03-16 DIAGNOSIS — J301 Allergic rhinitis due to pollen: Secondary | ICD-10-CM | POA: Diagnosis not present

## 2015-03-16 DIAGNOSIS — J3089 Other allergic rhinitis: Secondary | ICD-10-CM | POA: Diagnosis not present

## 2015-03-19 ENCOUNTER — Ambulatory Visit: Payer: Medicare Other | Admitting: Thoracic Surgery (Cardiothoracic Vascular Surgery)

## 2015-03-19 DIAGNOSIS — H43813 Vitreous degeneration, bilateral: Secondary | ICD-10-CM | POA: Diagnosis not present

## 2015-03-19 DIAGNOSIS — H5203 Hypermetropia, bilateral: Secondary | ICD-10-CM | POA: Diagnosis not present

## 2015-03-19 DIAGNOSIS — H25813 Combined forms of age-related cataract, bilateral: Secondary | ICD-10-CM | POA: Diagnosis not present

## 2015-03-19 DIAGNOSIS — H524 Presbyopia: Secondary | ICD-10-CM | POA: Diagnosis not present

## 2015-03-19 DIAGNOSIS — H52223 Regular astigmatism, bilateral: Secondary | ICD-10-CM | POA: Diagnosis not present

## 2015-03-21 ENCOUNTER — Encounter: Payer: Self-pay | Admitting: Thoracic Surgery (Cardiothoracic Vascular Surgery)

## 2015-03-21 ENCOUNTER — Ambulatory Visit (INDEPENDENT_AMBULATORY_CARE_PROVIDER_SITE_OTHER): Payer: Medicare Other | Admitting: Thoracic Surgery (Cardiothoracic Vascular Surgery)

## 2015-03-21 ENCOUNTER — Encounter: Payer: Medicare Other | Admitting: Family Medicine

## 2015-03-21 VITALS — BP 194/90 | HR 56 | Resp 20 | Ht 62.75 in | Wt 139.0 lb

## 2015-03-21 DIAGNOSIS — I10 Essential (primary) hypertension: Secondary | ICD-10-CM

## 2015-03-21 DIAGNOSIS — I711 Thoracic aortic aneurysm, ruptured, unspecified: Secondary | ICD-10-CM

## 2015-03-21 NOTE — Patient Instructions (Signed)
Continue to monitor your blood pressure daily and keep record for your primary care physician to use to adjust your medications  The patient should continue all previous medications without changes at this time

## 2015-03-21 NOTE — Progress Notes (Signed)
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 He was last seen here in our office on 02/26/2015 at which time he was clinically doing very well but blood pressure was severely elevated greater than 200 mmHg. He was seen the following day by Dr. Redmond School who changed him to Zestoretic from [place].  The patient returns to our office for follow-up and blood pressure check today. He is scheduled to be seen by his primary care physician within the next week. He carries with him a log where he has been recording his blood pressure at home. At home the patient's blood pressure typically averages 140/80 with some readings as high as 229 or 798 systolic. He reports feeling quite well.   Current Outpatient Prescriptions  Medication Sig Dispense Refill  . aspirin 81 MG tablet Take 81 mg by mouth at bedtime.     . Ferrous Sulfate (IRON) 325 (65 FE) MG TABS Take 1 tablet by mouth every other day.    . lisinopril-hydrochlorothiazide (ZESTORETIC) 20-12.5 MG per tablet Take 1 tablet by mouth daily. 90 tablet 3  . metoprolol (LOPRESSOR) 50 MG tablet Take 1 tablet by mouth  twice a day 180 tablet 0  . omeprazole (PRILOSEC) 20 MG capsule Take 1 capsule by mouth  every other day 45 capsule 3  . SYNTHROID 50 MCG tablet Take 1 tablet by mouth  daily before breakfast 90 tablet 0   No current facility-administered medications for this visit.      Physical Exam:   BP 194/90 mmHg  Pulse 56  Resp 20  Ht 5' 2.75" (1.594 m)  Wt 139 lb (63.05  kg)  BMI 24.81 kg/m2  SpO2 99%  General:  Well-appearing  Chest:   clear  CV:   Regular rate and rhythm without murmur  Incisions:  Completely healed  Abdomen:  Soft and nontender  Extremities:  Warm and well-perfused  Diagnostic Tests:  n/a   Impression:  Patient is clinically doing very well 1 year status post emergent repair of contained rupture of mycotic false aneurysm of the ascending thoracic aorta. Late follow-up CT angiogram of the chest looks quite good with intact repair of the thoracic aorta. The patient's hypertension appears to be under somewhat better control, although it is still elevated here in our office today. The patient states that his blood pressure always seems to go up when he goes to doctor's offices.   Plan:  The patient will continue to follow up with his primary care physician for long-term management of hypertension. He has been reminded how important strict control of his blood pressure remains for him given his history of ruptured thoracic aortic aneurysm. He will return to our office for routine follow-up and repeat CT angiogram in 1 year.    I spent in excess of 10 minutes during the conduct of this office consultation and >50% of this time involved direct face-to-face encounter with the patient for counseling and/or coordination of  their care.   Valentina Gu. Roxy Manns, MD 03/21/2015 12:42 PM

## 2015-03-23 ENCOUNTER — Ambulatory Visit: Payer: Medicare Other | Admitting: Thoracic Surgery (Cardiothoracic Vascular Surgery)

## 2015-03-23 ENCOUNTER — Encounter: Payer: Self-pay | Admitting: Family Medicine

## 2015-03-28 ENCOUNTER — Encounter: Payer: Medicare Other | Admitting: Family Medicine

## 2015-03-28 ENCOUNTER — Ambulatory Visit (INDEPENDENT_AMBULATORY_CARE_PROVIDER_SITE_OTHER): Payer: Medicare Other | Admitting: Family Medicine

## 2015-03-28 ENCOUNTER — Encounter: Payer: Self-pay | Admitting: Family Medicine

## 2015-03-28 VITALS — BP 180/100 | HR 56 | Ht 62.75 in | Wt 140.0 lb

## 2015-03-28 DIAGNOSIS — K219 Gastro-esophageal reflux disease without esophagitis: Secondary | ICD-10-CM

## 2015-03-28 DIAGNOSIS — E039 Hypothyroidism, unspecified: Secondary | ICD-10-CM

## 2015-03-28 DIAGNOSIS — F324 Major depressive disorder, single episode, in partial remission: Secondary | ICD-10-CM

## 2015-03-28 DIAGNOSIS — I10 Essential (primary) hypertension: Secondary | ICD-10-CM | POA: Diagnosis not present

## 2015-03-28 DIAGNOSIS — F325 Major depressive disorder, single episode, in full remission: Secondary | ICD-10-CM

## 2015-03-28 MED ORDER — METOPROLOL TARTRATE 50 MG PO TABS: ORAL_TABLET | ORAL | Status: DC

## 2015-03-28 NOTE — Progress Notes (Signed)
Chief Complaint  Patient presents with  . Hypertension    nonfasting med check.    He recently saw Dr. Roxy Manns for his year follow-up and his blood pressure was very high.  He saw Dr. Redmond School the following day, and meds were changed from lisinopril back to lisinopril HCT.  He reports that his BP was improved.  He reports that his BP machine has been verified as accurate in the past.  BP's at home are running 115-144/72-90 (average 130/82 range), pulse 54-64.  He does report that BP often runs high when he is at the doctor. He denies dizziness, headache, chest pain, shortness of breath.  His BP was 127/77 at home this morning.  Review of his chart shows that he was changed back to lisinopril from lisinopril HCTZ in 06/2013 due to urinary frequency, dry mouth and hard stools. He is not complaining of these symptoms currently.  Getting regular exercise.  He had been having shoulder pain.  He has seen Dr. Onnie Graham and diagnosed with arthritis.  He had cortisone shots in both shoulders about a month ago, and pain has improved.  He fell in his kitchen, landed on the left knee on a tile floor.  This was a couple of weeks ago.  He still has some soreness at the bottom portion of the knee.  No giving way or swelling.  He had a slight bruise that resolved.  He was coming in from outside and he tripped over the threshold on his way in.  He doesn't feel he can recover as quickly to prevent a fall, reaction time was slower.  He feels like his balance isn't the same as it used to be.  Denies other falls.  Feels like his balance is good overall (says he can stand on one foot).  No further problem with diarrhea/C.diff (had fecal transplant).  Depression:  He has been off the citalopram for at least 3-4 months and he denies any recurrent problems with depression. Moods are very good.  GERD: He continues to take omeprazole every other day with good results. He has h/o iron deficiency, and he continues to take iron every  other day on the days he doesn't take the omeprazole. He has had recurrence of reflux when PPI was stopped, and recurrence of anemia when iron was stopped in the past. Denies dysphagia.  Hypothyroidism: Denies any symptoms--no changes in energy, weight, moods, hair/skin/bowels.   PMH, PSH, SH reviewed today Outpatient Encounter Prescriptions as of 03/28/2015  Medication Sig  . aspirin 81 MG tablet Take 81 mg by mouth at bedtime.   . Ferrous Sulfate (IRON) 325 (65 FE) MG TABS Take 1 tablet by mouth every other day.  . lisinopril-hydrochlorothiazide (ZESTORETIC) 20-12.5 MG per tablet Take 1 tablet by mouth daily.  . metoprolol (LOPRESSOR) 50 MG tablet Take 1 tablet by mouth  twice a day  . omeprazole (PRILOSEC) 20 MG capsule Take 1 capsule by mouth  every other day  . SYNTHROID 50 MCG tablet Take 1 tablet by mouth  daily before breakfast  . [DISCONTINUED] metoprolol (LOPRESSOR) 50 MG tablet Take 1 tablet by mouth  twice a day   No facility-administered encounter medications on file as of 03/28/2015.   No Known Allergies  ROS:  Denies chest pain, headaches, dizziness, shortness of breath, edema, palpitations. Denies nausea, vomiting, heartburn, abdominal pain or any recurrent diarrhea.  Denies thyroid symptoms (hair/skin/bowel/mood/energy), weight changes. Normal appetite.  Denies depression, anxiety.  See HPI  PHYSICAL EXAM: BP 180/100  mmHg  Pulse 56  Ht 5' 2.75" (1.594 m)  Wt 140 lb (63.504 kg)  BMI 24.99 kg/m2  Well developed, pleasant, elderly male in good spirits HEENT: PERRL, EOMI, conjunctiva clear.  OP is clear, moist mucus membranes Neck: no lymphadenopathy, thyromegaly or carotid bruit Heart: regular rate and rhythm Lungs: clear bilaterally Back: no CVA tenderness Abdomen: soft, nontender, no mass Extremities: no edema, normal pulses Skin: normal turgor, no bruising or rash Psych: normal mood, affect, hygiene and grooming Neuro: alert and oriented.  Normal gait, strength,  cranial nerves  ASSESSMENT/PLAN:  Essential hypertension, benign - High in office, acceptable at home (suspect white coat HTN). Continue monitoring at home, verify elsewhere. Continue current meds, check electrolytes today - Plan: Basic metabolic panel, metoprolol (LOPRESSOR) 50 MG tablet  Gastroesophageal reflux disease, esophagitis presence not specified - controlled on qod PPI regimen  Hypothyroidism, unspecified hypothyroidism type - euthyroid by history  Depression, major, in remission - he is doing well off medication  Goals of BP reviewed--clearly has white coat component. Consider ambulatory BP monitoring, vs periodically checking accuracy of home monitor.  Continue regular exercise, low sodium diet.   Declines referral to PT for fall prevention/balance issues. Will be more careful and call us if he changes his mind.  Fu 6 months for AWV/medcheck+ (fasting)

## 2015-03-28 NOTE — Patient Instructions (Addendum)
Blood pressures at home are much improved.  They are clearly higher at the office, so continue to check regularly at home (2-3 times/week, but always around the time of a doctor visit, so you can always tell us what it was that day, prior to coming to the office (when it is usually high). Bring your monitor with you to your next visit so we can recheck it.  Continue your current medications. We will be in touch with your test results.  Let us know if you change your mind are would be interested in seeing physical therapy for help with balance and fall prevention.

## 2015-03-29 DIAGNOSIS — J3089 Other allergic rhinitis: Secondary | ICD-10-CM | POA: Diagnosis not present

## 2015-03-29 DIAGNOSIS — J301 Allergic rhinitis due to pollen: Secondary | ICD-10-CM | POA: Diagnosis not present

## 2015-03-29 LAB — BASIC METABOLIC PANEL
BUN: 19 mg/dL (ref 7–25)
CALCIUM: 10 mg/dL (ref 8.6–10.3)
CO2: 30 meq/L (ref 20–31)
CREATININE: 1.02 mg/dL (ref 0.70–1.18)
Chloride: 98 mEq/L (ref 98–110)
GLUCOSE: 77 mg/dL (ref 65–99)
Potassium: 4.4 mEq/L (ref 3.5–5.3)
Sodium: 138 mEq/L (ref 135–146)

## 2015-04-04 DIAGNOSIS — J3089 Other allergic rhinitis: Secondary | ICD-10-CM | POA: Diagnosis not present

## 2015-04-04 DIAGNOSIS — J301 Allergic rhinitis due to pollen: Secondary | ICD-10-CM | POA: Diagnosis not present

## 2015-04-11 DIAGNOSIS — J301 Allergic rhinitis due to pollen: Secondary | ICD-10-CM | POA: Diagnosis not present

## 2015-04-11 DIAGNOSIS — J3089 Other allergic rhinitis: Secondary | ICD-10-CM | POA: Diagnosis not present

## 2015-04-18 DIAGNOSIS — J301 Allergic rhinitis due to pollen: Secondary | ICD-10-CM | POA: Diagnosis not present

## 2015-04-18 DIAGNOSIS — J3089 Other allergic rhinitis: Secondary | ICD-10-CM | POA: Diagnosis not present

## 2015-04-20 DIAGNOSIS — J301 Allergic rhinitis due to pollen: Secondary | ICD-10-CM | POA: Diagnosis not present

## 2015-04-20 DIAGNOSIS — J3089 Other allergic rhinitis: Secondary | ICD-10-CM | POA: Diagnosis not present

## 2015-04-23 DIAGNOSIS — J301 Allergic rhinitis due to pollen: Secondary | ICD-10-CM | POA: Diagnosis not present

## 2015-04-23 DIAGNOSIS — J3089 Other allergic rhinitis: Secondary | ICD-10-CM | POA: Diagnosis not present

## 2015-04-25 DIAGNOSIS — J301 Allergic rhinitis due to pollen: Secondary | ICD-10-CM | POA: Diagnosis not present

## 2015-04-25 DIAGNOSIS — J3089 Other allergic rhinitis: Secondary | ICD-10-CM | POA: Diagnosis not present

## 2015-05-02 ENCOUNTER — Telehealth: Payer: Self-pay | Admitting: Family Medicine

## 2015-05-02 DIAGNOSIS — I10 Essential (primary) hypertension: Secondary | ICD-10-CM

## 2015-05-02 DIAGNOSIS — J3089 Other allergic rhinitis: Secondary | ICD-10-CM | POA: Diagnosis not present

## 2015-05-02 DIAGNOSIS — J301 Allergic rhinitis due to pollen: Secondary | ICD-10-CM | POA: Diagnosis not present

## 2015-05-02 MED ORDER — LISINOPRIL-HYDROCHLOROTHIAZIDE 20-12.5 MG PO TABS: 1.0000 | ORAL_TABLET | Freq: Every day | ORAL | Status: DC

## 2015-05-02 NOTE — Telephone Encounter (Signed)
Rcvd refill request from [facility] for Lisinopril 20-12.5 mg #90

## 2015-05-02 NOTE — Telephone Encounter (Signed)
Done

## 2015-05-09 DIAGNOSIS — J3089 Other allergic rhinitis: Secondary | ICD-10-CM | POA: Diagnosis not present

## 2015-05-09 DIAGNOSIS — J301 Allergic rhinitis due to pollen: Secondary | ICD-10-CM | POA: Diagnosis not present

## 2015-05-14 ENCOUNTER — Other Ambulatory Visit (INDEPENDENT_AMBULATORY_CARE_PROVIDER_SITE_OTHER): Payer: Medicare Other

## 2015-05-14 DIAGNOSIS — Z23 Encounter for immunization: Secondary | ICD-10-CM | POA: Diagnosis not present

## 2015-05-16 DIAGNOSIS — J3089 Other allergic rhinitis: Secondary | ICD-10-CM | POA: Diagnosis not present

## 2015-05-16 DIAGNOSIS — J301 Allergic rhinitis due to pollen: Secondary | ICD-10-CM | POA: Diagnosis not present

## 2015-05-21 DIAGNOSIS — H02831 Dermatochalasis of right upper eyelid: Secondary | ICD-10-CM | POA: Diagnosis not present

## 2015-05-21 DIAGNOSIS — H02834 Dermatochalasis of left upper eyelid: Secondary | ICD-10-CM | POA: Diagnosis not present

## 2015-05-23 DIAGNOSIS — J301 Allergic rhinitis due to pollen: Secondary | ICD-10-CM | POA: Diagnosis not present

## 2015-05-23 DIAGNOSIS — J3089 Other allergic rhinitis: Secondary | ICD-10-CM | POA: Diagnosis not present

## 2015-05-27 ENCOUNTER — Other Ambulatory Visit: Payer: Self-pay | Admitting: Family Medicine

## 2015-05-30 DIAGNOSIS — J301 Allergic rhinitis due to pollen: Secondary | ICD-10-CM | POA: Diagnosis not present

## 2015-05-30 DIAGNOSIS — J3089 Other allergic rhinitis: Secondary | ICD-10-CM | POA: Diagnosis not present

## 2015-06-05 DIAGNOSIS — L255 Unspecified contact dermatitis due to plants, except food: Secondary | ICD-10-CM | POA: Diagnosis not present

## 2015-06-06 DIAGNOSIS — J301 Allergic rhinitis due to pollen: Secondary | ICD-10-CM | POA: Diagnosis not present

## 2015-06-06 DIAGNOSIS — J3089 Other allergic rhinitis: Secondary | ICD-10-CM | POA: Diagnosis not present

## 2015-06-08 ENCOUNTER — Telehealth: Payer: Self-pay

## 2015-06-08 DIAGNOSIS — Z0279 Encounter for issue of other medical certificate: Secondary | ICD-10-CM

## 2015-06-08 NOTE — Telephone Encounter (Signed)
Medical Records sent out to [facility] on 06/08/15

## 2015-06-13 DIAGNOSIS — J3089 Other allergic rhinitis: Secondary | ICD-10-CM | POA: Diagnosis not present

## 2015-06-13 DIAGNOSIS — J301 Allergic rhinitis due to pollen: Secondary | ICD-10-CM | POA: Diagnosis not present

## 2015-06-20 DIAGNOSIS — J301 Allergic rhinitis due to pollen: Secondary | ICD-10-CM | POA: Diagnosis not present

## 2015-06-20 DIAGNOSIS — J3089 Other allergic rhinitis: Secondary | ICD-10-CM | POA: Diagnosis not present

## 2015-06-27 DIAGNOSIS — J3089 Other allergic rhinitis: Secondary | ICD-10-CM | POA: Diagnosis not present

## 2015-06-27 DIAGNOSIS — J301 Allergic rhinitis due to pollen: Secondary | ICD-10-CM | POA: Diagnosis not present

## 2015-06-28 ENCOUNTER — Encounter: Payer: Self-pay | Admitting: Gastroenterology

## 2015-07-04 DIAGNOSIS — J301 Allergic rhinitis due to pollen: Secondary | ICD-10-CM | POA: Diagnosis not present

## 2015-07-04 DIAGNOSIS — J3089 Other allergic rhinitis: Secondary | ICD-10-CM | POA: Diagnosis not present

## 2015-07-11 DIAGNOSIS — J301 Allergic rhinitis due to pollen: Secondary | ICD-10-CM | POA: Diagnosis not present

## 2015-07-11 DIAGNOSIS — J3089 Other allergic rhinitis: Secondary | ICD-10-CM | POA: Diagnosis not present

## 2015-07-12 DIAGNOSIS — J3089 Other allergic rhinitis: Secondary | ICD-10-CM | POA: Diagnosis not present

## 2015-07-12 DIAGNOSIS — H1045 Other chronic allergic conjunctivitis: Secondary | ICD-10-CM | POA: Diagnosis not present

## 2015-07-12 DIAGNOSIS — J301 Allergic rhinitis due to pollen: Secondary | ICD-10-CM | POA: Diagnosis not present

## 2015-07-24 DIAGNOSIS — J301 Allergic rhinitis due to pollen: Secondary | ICD-10-CM | POA: Diagnosis not present

## 2015-07-24 DIAGNOSIS — J3089 Other allergic rhinitis: Secondary | ICD-10-CM | POA: Diagnosis not present

## 2015-08-06 DIAGNOSIS — M25552 Pain in left hip: Secondary | ICD-10-CM | POA: Diagnosis not present

## 2015-08-06 DIAGNOSIS — M25551 Pain in right hip: Secondary | ICD-10-CM | POA: Diagnosis not present

## 2015-08-08 DIAGNOSIS — J301 Allergic rhinitis due to pollen: Secondary | ICD-10-CM | POA: Diagnosis not present

## 2015-08-08 DIAGNOSIS — J3089 Other allergic rhinitis: Secondary | ICD-10-CM | POA: Diagnosis not present

## 2015-08-21 ENCOUNTER — Other Ambulatory Visit: Payer: Self-pay | Admitting: Family Medicine

## 2015-08-21 NOTE — Telephone Encounter (Signed)
Has appt 10/03/15 sched. meds refilled.

## 2015-08-21 NOTE — Telephone Encounter (Signed)
Is this ok to refill?  

## 2015-08-22 DIAGNOSIS — J3089 Other allergic rhinitis: Secondary | ICD-10-CM | POA: Diagnosis not present

## 2015-08-22 DIAGNOSIS — J301 Allergic rhinitis due to pollen: Secondary | ICD-10-CM | POA: Diagnosis not present

## 2015-08-31 ENCOUNTER — Encounter: Payer: Self-pay | Admitting: Family Medicine

## 2015-08-31 MED ORDER — TRIAMCINOLONE ACETONIDE 0.1 % EX CREA: 1.0000 "application " | TOPICAL_CREAM | Freq: Two times a day (BID) | CUTANEOUS | Status: DC

## 2015-09-05 DIAGNOSIS — J3089 Other allergic rhinitis: Secondary | ICD-10-CM | POA: Diagnosis not present

## 2015-09-05 DIAGNOSIS — J301 Allergic rhinitis due to pollen: Secondary | ICD-10-CM | POA: Diagnosis not present

## 2015-09-06 DIAGNOSIS — J3089 Other allergic rhinitis: Secondary | ICD-10-CM | POA: Diagnosis not present

## 2015-09-06 DIAGNOSIS — J301 Allergic rhinitis due to pollen: Secondary | ICD-10-CM | POA: Diagnosis not present

## 2015-09-18 ENCOUNTER — Other Ambulatory Visit: Payer: Self-pay | Admitting: Family Medicine

## 2015-09-19 DIAGNOSIS — J3089 Other allergic rhinitis: Secondary | ICD-10-CM | POA: Diagnosis not present

## 2015-09-19 DIAGNOSIS — J301 Allergic rhinitis due to pollen: Secondary | ICD-10-CM | POA: Diagnosis not present

## 2015-09-25 ENCOUNTER — Encounter: Payer: Self-pay | Admitting: Family Medicine

## 2015-09-25 MED ORDER — OMEPRAZOLE 20 MG PO CPDR: DELAYED_RELEASE_CAPSULE | ORAL | Status: DC

## 2015-09-25 NOTE — Addendum Note (Signed)
Addended by: Minette Headland A on: 09/25/2015 12:52 PM   Modules accepted: Orders

## 2015-10-03 ENCOUNTER — Encounter: Payer: Self-pay | Admitting: Family Medicine

## 2015-10-03 ENCOUNTER — Ambulatory Visit (INDEPENDENT_AMBULATORY_CARE_PROVIDER_SITE_OTHER): Payer: Medicare Other | Admitting: Family Medicine

## 2015-10-03 VITALS — BP 144/80 | HR 64 | Ht 64.0 in | Wt 146.8 lb

## 2015-10-03 DIAGNOSIS — K219 Gastro-esophageal reflux disease without esophagitis: Secondary | ICD-10-CM | POA: Diagnosis not present

## 2015-10-03 DIAGNOSIS — I10 Essential (primary) hypertension: Secondary | ICD-10-CM

## 2015-10-03 DIAGNOSIS — Z0000 Encounter for general adult medical examination without abnormal findings: Secondary | ICD-10-CM

## 2015-10-03 DIAGNOSIS — D509 Iron deficiency anemia, unspecified: Secondary | ICD-10-CM | POA: Diagnosis not present

## 2015-10-03 DIAGNOSIS — E039 Hypothyroidism, unspecified: Secondary | ICD-10-CM | POA: Diagnosis not present

## 2015-10-03 DIAGNOSIS — Z23 Encounter for immunization: Secondary | ICD-10-CM

## 2015-10-03 DIAGNOSIS — Z5181 Encounter for therapeutic drug level monitoring: Secondary | ICD-10-CM

## 2015-10-03 LAB — CBC WITH DIFFERENTIAL/PLATELET
BASOS PCT: 0 % (ref 0–1)
Basophils Absolute: 0 10*3/uL (ref 0.0–0.1)
Eosinophils Absolute: 0.3 10*3/uL (ref 0.0–0.7)
Eosinophils Relative: 4 % (ref 0–5)
HEMATOCRIT: 46 % (ref 39.0–52.0)
HEMOGLOBIN: 15.2 g/dL (ref 13.0–17.0)
Lymphocytes Relative: 35 % (ref 12–46)
Lymphs Abs: 2.4 10*3/uL (ref 0.7–4.0)
MCH: 28.1 pg (ref 26.0–34.0)
MCHC: 33 g/dL (ref 30.0–36.0)
MCV: 85.2 fL (ref 78.0–100.0)
MONO ABS: 0.8 10*3/uL (ref 0.1–1.0)
MONOS PCT: 11 % (ref 3–12)
MPV: 9.4 fL (ref 8.6–12.4)
NEUTROS ABS: 3.5 10*3/uL (ref 1.7–7.7)
Neutrophils Relative %: 50 % (ref 43–77)
Platelets: 314 10*3/uL (ref 150–400)
RBC: 5.4 MIL/uL (ref 4.22–5.81)
RDW: 13.8 % (ref 11.5–15.5)
WBC: 6.9 10*3/uL (ref 4.0–10.5)

## 2015-10-03 LAB — HEMOCCULT GUIAC POC 1CARD (OFFICE): Fecal Occult Blood, POC: NEGATIVE

## 2015-10-03 LAB — COMPREHENSIVE METABOLIC PANEL
ALK PHOS: 62 U/L (ref 40–115)
ALT: 16 U/L (ref 9–46)
AST: 26 U/L (ref 10–35)
Albumin: 4 g/dL (ref 3.6–5.1)
BUN: 20 mg/dL (ref 7–25)
CO2: 30 mmol/L (ref 20–31)
CREATININE: 1.26 mg/dL — AB (ref 0.70–1.18)
Calcium: 9.3 mg/dL (ref 8.6–10.3)
Chloride: 100 mmol/L (ref 98–110)
GLUCOSE: 93 mg/dL (ref 65–99)
POTASSIUM: 4.2 mmol/L (ref 3.5–5.3)
SODIUM: 137 mmol/L (ref 135–146)
TOTAL PROTEIN: 7.6 g/dL (ref 6.1–8.1)
Total Bilirubin: 0.6 mg/dL (ref 0.2–1.2)

## 2015-10-03 LAB — TSH: TSH: 2.147 u[IU]/mL (ref 0.350–4.500)

## 2015-10-03 NOTE — Progress Notes (Signed)
Chief Complaint  Patient presents with  . Medicare Wellness    nonfasting med check plus/AWV. No concerns.    Danny Frederick is a 81 y.o. male who presents for annual wellness visit and follow-up on chronic medical conditions.  He has no specific concerns.  Hypertension follow-up: His BP machine has been verified as accurate in the past, but brought it again to have it rechecked. BP per nurse was 162/90, pt's machine 157/92. BP's at home are running 111-161 (average 140)/69-94 (avg 84); pulse 53-66 (avg 60).  He does report that BP often runs high when he is at the doctor. He denies dizziness, headache, chest pain, shortness of breath. His BP was 149/84 at home this morning. He eats [employer] for lunch once a month (when his wife gets her hair done), and had some for lunch today. He tries to eat a low sodium diet in general.  They eat out only once a week.  He sees Dr. Owens yearly, in July.  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  He hasn't had any problems.  He has been getting regular exercise. He hasn't had any recurrent problems with shoulder pain since bilateral cortisone shots over the summer by Dr. Supple. He no longer lifts weights overhead.  He saw Dr. Supple recently for his hips, told he has arthritis. Tylenol before walking is effective.  No further problem with diarrhea/C.diff (had fecal transplant). Bowels are normal.  Depression: He has been off the citalopram for at least 9-10 months and he denies any recurrent problems with depression. Moods are very good.  GERD: He continues to take omeprazole every other day with good results. He has h/o iron deficiency, and he continues to take iron every other day on the days he doesn't take the omeprazole. He has had recurrence of reflux when PPI was stopped,  and recurrence of anemia when iron was stopped in the past. Denies dysphagia.  Hypothyroidism: Denies any symptoms--no changes in energy, weight, moods, hair/skin/bowels.   Dry/itchy skin:  He hasn't seen the dermatologist in many years.  Was given prescription for TAC 0.1% that he uses once daily to both arms in the winter when itchy. Sometimes applies to itchy legs as well.  Doesn't actually get a focal area of rash, just itchy arms and legs.  Immunization History  Administered Date(s) Administered  . Influenza Split 06/13/2011  . Influenza, High Dose Seasonal PF 06/09/2013, 05/23/2014, 05/14/2015  . Pneumococcal Conjugate-13 06/15/2013  . Pneumococcal Polysaccharide-23 09/02/2003  . Tdap 10/02/2005  . Zoster 03/11/2005   Last colonoscopy: 12/09, and again 10/2014 at time of fecal transplant. Skin tag and diverticulosis were noted. Last PSA: January 2016 Dentist: once a year Ophtho: yearly Exercise:5-6 hours/week (weights at the gym, recumbent bike, treadmill) and walks outside on the days he doesn't go to the gym.  Exercises 5 days/week.  Other doctors caring for patient include: Neurosurgeon: Dr. Cabbell (no longer sees) General surgeon (hemorrhoids): Dr. Wilson (no longer sees) Ophtho: [facility]: Dr. Pyrtle  Dentist -- previously Dr. Goodman, now seeing his replacement at same office Dr. Frymark at [facility] (audiologist) ID--Dr. Snider CV surgeon--Dr. [name] Cardiology--Dr. Crenshaw Allergist--Dr. Whelan  Depression screen: Negative Fall screen: one fall--He fell in his kitchen in July, landed on the left knee on a tile floor. Denies any significant injury (just bruising). He was coming   in from outside and he tripped over the threshold on his way in.  Functional status screen: notable for hearing loss and recent intermittent blurred vision.  End of Life Discussion:  Patient has a living will and medical power of attorney  Past Medical History  Diagnosis Date   . Hypertension   . Unspecified hypothyroidism   . Mitral valve prolapse     h/o; normal echo 12/2011 with no MVP seen  . Foot drop, right   . BPH (benign prostatic hypertrophy)   . Iron deficiency anemia, unspecified 6/09  . Hearing loss in left ear     both ears now  . Diverticulosis of colon 12/09  . Internal hemorrhoids   . GERD (gastroesophageal reflux disease)   . Allergic rhinitis, cause unspecified     on allergy shots (Dr. Whelan)  . Allergic conjunctivitis   . Ruptured thoracic aortic aneurysm (HCC) 01/23/2014  . 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 Right ventricular dysfunction 01/23/2014    Secondary to obstruction of main pulmonary artery  . Hx of echocardiogram     Echo (03/2014):  Mild LVH, EF 50-55%, no RWMA, Gr 1 DD, mild MR, mild reduced RVSF  . History of Clostridium difficile     multiple times 2016--s/p fecal transplant (Dr. Snider).    Past Surgical History  Procedure Laterality Date  . Prostate surgery  2007    photovaporization  . Cervical laminectomy  1972    C5-6  . Spine surgery  2006    L4-5 disk surgery  . Rotator cuff repair  10/2008    left; Dr. Supple  . Esophagogastroduodenoscopy  10/31/08    normal; Dr. Jacob  . Tonsillectomy    . Hemorroidal banding  04/07/2013    x3-Dr.Eric Wilson  . Hemorrhoidectomy with hemorrhoid banding  04/07/13  . Spine surgery  04/2013    L4-5, L5-S1 fusion.  Dr. Cabbell  . Thoracic aortic aneurysm repair N/A 01/23/2014    Procedure: THORACIC ASCENDING ANEURYSM REPAIR (AAA);  Surgeon: [name] [name], MD;  Location: [facility] OR;  Service: Open Heart Surgery;  Laterality: N/A;  . Colonoscopy with propofol N/A 11/09/2014    Procedure: COLONOSCOPY WITH PROPOFOL;  Surgeon: Jay M Pyrtle, MD;  Location: [facility];  Service: Gastroenterology;  Laterality: N/A;  . Fecal transplant N/A 11/09/2014    Procedure: FECAL TRANSPLANT;  Surgeon: Jay M Pyrtle, MD;  Location: [facility];  Service: Gastroenterology;  Laterality:  N/A;    Social History   Social History  . Marital Status: Married    Spouse Name: N/A  . Number of Children: 2  . Years of Education: N/A   Occupational History  . Not on file.   Social History Main Topics  . Smoking status: Former Smoker    Types: Cigarettes    Quit date: 09/01/1977  . Smokeless tobacco: Never Used  . Alcohol Use: Yes     Comment: 1-2 glasses wine per day  . Drug Use: No  . Sexual Activity: Not on file   Other Topics Concern  . Not on file   Social History Narrative   Lives with wife.  (no longer has pet rats).  1 daughter in [place], 1 daughter in [place], [place]    Family History  Problem Relation Age of Onset  . Diabetes Mother   . Heart disease Mother     CHF  . Stroke Father   . Parkinsonism Father   . Cancer Brother     bladder cancer  .   Cancer Brother     prostate cancer  . Emphysema Brother   . Diabetes Sister   . Colon cancer Neg Hx   . Hypertension Mother   . Hypertension Father   . Depression Mother     Outpatient Encounter Prescriptions as of 10/03/2015  Medication Sig Note  . aspirin 81 MG tablet Take 81 mg by mouth at bedtime.    . Ferrous Sulfate (IRON) 325 (65 FE) MG TABS Take 1 tablet by mouth every other day.   . ipratropium (ATROVENT) 0.03 % nasal spray Place 1 spray into both nostrils 2 (two) times daily.  10/03/2015: Uses once daily  . lisinopril-hydrochlorothiazide (PRINZIDE,ZESTORETIC) 20-12.5 MG tablet Take 1 tablet by mouth  daily   . metoprolol (LOPRESSOR) 50 MG tablet Take 1 tablet by mouth  twice a day   . omeprazole (PRILOSEC) 20 MG capsule Take 1 capsule by mouth  every other day   . SYNTHROID 50 MCG tablet Take 1 tablet by mouth  daily before breakfast   . triamcinolone cream (KENALOG) 0.1 % Apply 1 application topically 2 (two) times daily. 10/03/2015: Uses prn, usually in the winter  . EPIPEN 2-PAK 0.3 MG/0.3ML SOAJ injection Reported on 10/03/2015 10/03/2015: Received from: External Pharmacy   No  facility-administered encounter medications on file as of 10/03/2015.    No Known Allergies  ROS: The patient denies anorexia, fever, headaches,ear pain, hoarseness, chest pain, palpitations, dizziness, syncope, dyspnea on exertion, cough, swelling, nausea, vomiting, diarrhea, constipation, abdominal pain, melena, hematochezia, indigestion/heartburn, hematuria, incontinence, erectile dysfunction, nocturia, weakened urine stream, dysuria, genital lesions, joint pains, numbness, tingling, weakness, tremor, suspicious skin lesions, depression, anxiety, abnormal bleeding/bruising, or enlarged lymph nodes. He has some short-lived blurred vision--due for eye exam.  Hearing unchanged--now has bilateral hearing aids. Itchy skin on his arms and legs in the winter. Slight cough (small amount of phlegm, not discolored).  Allergies are well controlled. Some bilateral hip pain--does well with tylenol before walking.  PHYSICAL EXAM:  BP 162/90 mmHg  Pulse 64  Ht 5' 4" (1.626 m)  Wt 146 lb 12.8 oz (66.588 kg)  BMI 25.19 kg/m2  144/80 on repeat by MD, RA  General Appearance:   Alert, cooperative, no distress, appears stated age  Head:   Normocephalic, without obvious abnormality, atraumatic  Eyes:   PERRL, conjunctiva/corneas clear, EOM's intact, fundi   benign  Ears:   normal external ears. +hearing aids  Nose:  Nares normal, mucosa normal, no drainage or sinus tenderness  Throat:  Lips, mucosa, and tongue normal; upper dentures  Neck:  Supple, no lymphadenopathy; thyroid: no enlargement/tenderness/nodules; no carotid  bruit or JVD  Back:  Spine nontender, no curvature, no CVA tenderness. WHSS midline spine,  Lungs:   Clear to auscultation bilaterally without wheezes, rales or ronchi; respirations unlabored  Chest Wall:   No tenderness. WHSS midline chest, with many small vessels across the scar (making scar appear blue-tinged)   Heart:    Regular rate and rhythm, S1 and S2 normal, no murmur, rub  or gallop  Breast Exam:   No chest wall tenderness, masses or gynecomastia  Abdomen:   Soft, non-tender, nondistended, normoactive bowel sounds,   no masses, no hepatosplenomegaly  Genitalia:   Normal male external genitalia without lesions. Testicles without masses. No inguinal hernias.  Rectal:   Normal sphincter tone, no masses or tenderness; guaiac negative stool. Prostate smooth, no nodules, not enlarged.  Extremities:  No clubbing, cyanosis or edema  Pulses:  2+ and symmetric all extremities  Skin:  Skin color, texture, turgor normal, no rashes or lesions. Some actinic changes to skin, but no precancerous or suspicious lesions noted  Lymph nodes:  Cervical, supraclavicular, and axillary nodes normal  Neurologic:  CNII-XII intact, normal strength, sensation and gait; reflexes 2+ and symmetric throughout   Psych: Normal mood, affect, hygiene and grooming.        ASSESSMENT/PLAN:  Medicare annual wellness visit, subsequent  Immunization due - Plan: Pneumococcal polysaccharide vaccine 23-valent greater than or equal to 2yo subcutaneous/IM  Hypothyroidism, unspecified hypothyroidism type - euthyroid by history: due to have TSH checked - Plan: TSH  Gastroesophageal reflux disease, esophagitis presence not specified - well controlled with qod dosing  Essential hypertension, benign - borderline control. Reviewed low sodium diet, continue regular exercise and monitoring. Let us know if consistently >140 systolic. continue current meds - Plan: Comprehensive metabolic panel  Iron deficiency anemia - Plan: CBC with Differential/Platelet  Medication monitoring encounter - Plan: Comprehensive metabolic panel, CBC with Differential/Platelet, TSH   nonfasting labs today--c-met, TSH, CBC Lipids okay last year. Declines PSA--counseled re:  risks/benefits.   Normal PSA last year. Normal glucose in July with b-met.   Discussed PSA screening (risks/benefits)--declines screening this year; recommended at least 30 minutes of aerobic activity at least 5 days/week, weight-bearing exercise at least 2x/week; proper sunscreen use reviewed; healthy diet and alcohol recommendations (less than or equal to 2 drinks/day) reviewed; regular seatbelt use; changing batteries in smoke detectors. Immunization recommendations discussed--due for tetanus booster.  Rx given to get at pharmacy. Given that he expects to have a great grandchild within the next 1-2 years, he would prefer to get TdaP rather than Td.  Pneumovax booster today. Colonoscopy recommendations reviewed, UTD.  Full Code/Full Care. Has POA and living will. MOST form completed.   Itchy skin in winter--Try and take short showers, not too hot.  Moisturize immediately after bathing, and at least 2-3 times/day. If having diffuse itching, despite moisturizing, try and OTC hydrocortisone cream, and save the prescription cream for smaller, more focal areas of itching or rash.  Please try and get us copies of your Living Will and Healthcare Power of attorney.  Go to the pharmacy to get your tetanus shot (it may be covered by your Part D, it is not covered if we give it to you in the office).  Your blood pressure is running a little above goal, higher than it has been in the past.  Continue to monitor and try and follow a low sodium diet.  Contact us sooner than your next appointment in 6 months if your blood pressure is consistently over 140 (whereas now it is AVERAGING 140).     Medicare Attestation I have personally reviewed: The patient's medical and social history Their use of alcohol, tobacco or illicit drugs Their current medications and supplements The patient's functional ability including ADLs,fall risks, home safety risks, cognitive, and hearing and visual impairment Diet and  physical activities Evidence for depression or mood disorders  The patient's weight, height, BMI, and visual acuity have been recorded in the chart.  I have made referrals, counseling, and provided education to the patient based on review of the above and I have provided the patient with a written personalized care plan for preventive services.     , A, MD   10/03/2015      

## 2015-10-03 NOTE — Patient Instructions (Signed)
  HEALTH MAINTENANCE RECOMMENDATIONS:  It is recommended that you get at least 30 minutes of aerobic exercise at least 5 days/week (for weight loss, you may need as much as 60-90 minutes). This can be any activity that gets your heart rate up. This can be divided in 10-15 minute intervals if needed, but try and build up your endurance at least once a week.  Weight bearing exercise is also recommended twice weekly.  Eat a healthy diet with lots of vegetables, fruits and fiber.  "Colorful" foods have a lot of vitamins (ie green vegetables, tomatoes, red peppers, etc).  Limit sweet tea, regular sodas and alcoholic beverages, all of which has a lot of calories and sugar.  Up to 2 alcoholic drinks daily may be beneficial for men (unless trying to lose weight, watch sugars).  Drink a lot of water.  Sunscreen of at least SPF 30 should be used on all sun-exposed parts of the skin when outside between the hours of 10 am and 4 pm (not just when at beach or pool, but even with exercise, golf, tennis, and yard work!)  Use a sunscreen that says "broad spectrum" so it covers both UVA and UVB rays, and make sure to reapply every 1-2 hours.  Remember to change the batteries in your smoke detectors when changing your clock times in the spring and fall.  Use your seat belt every time you are in a car, and please drive safely and not be distracted with cell phones and texting while driving.   Try and take short showers, not too hot.  Moisturize immediately after bathing, and at least 2-3 times/day. If having diffuse itching, despite moisturizing, try and OTC hydrocortisone cream, and save the prescription cream for smaller, more focal areas of itching or rash.  Please try and get [place] copies of your Living Will and Healthcare Power of attorney.  Go to the pharmacy to get your tetanus shot (it may be covered by your Part D, it is not covered if we give it to you in the office).  Your blood pressure is running a little  above goal, higher than it has been in the past.  Continue to monitor and try and follow a low sodium diet.  Contact us sooner than your next appointment in 6 months if your blood pressure is consistently over 140 (whereas now it is AVERAGING 140).      Danny Frederick , Thank you for taking time to come for your Medicare Wellness Visit. I appreciate your ongoing commitment to your health goals. Please review the following plan we discussed and let me know if I can assist you in the future.   These are the goals we discussed: Goals    None      This is a list of the screening recommended for you and due dates:  Health Maintenance  Topic Date Due  . Tetanus Vaccine  10/03/2015  . Flu Shot  04/01/2016  . Shingles Vaccine  Completed  . Pneumonia vaccines  Completed   Go to the pharmacy to get your tetanus shot (bring the prescription). We gave you an additional pneumonia vaccine booster today (had been 12 years since the last one)

## 2015-10-04 DIAGNOSIS — J3089 Other allergic rhinitis: Secondary | ICD-10-CM | POA: Diagnosis not present

## 2015-10-04 DIAGNOSIS — J301 Allergic rhinitis due to pollen: Secondary | ICD-10-CM | POA: Diagnosis not present

## 2015-10-08 ENCOUNTER — Encounter: Payer: Self-pay | Admitting: *Deleted

## 2015-10-09 ENCOUNTER — Other Ambulatory Visit: Payer: Self-pay | Admitting: Family Medicine

## 2015-10-10 ENCOUNTER — Encounter: Payer: Self-pay | Admitting: Family Medicine

## 2015-10-10 DIAGNOSIS — F325 Major depressive disorder, single episode, in full remission: Secondary | ICD-10-CM

## 2015-10-11 ENCOUNTER — Encounter: Payer: Self-pay | Admitting: Family Medicine

## 2015-10-11 MED ORDER — CITALOPRAM HYDROBROMIDE 20 MG PO TABS: 20.0000 mg | ORAL_TABLET | Freq: Every day | ORAL | Status: DC

## 2015-10-17 DIAGNOSIS — J3089 Other allergic rhinitis: Secondary | ICD-10-CM | POA: Diagnosis not present

## 2015-10-17 DIAGNOSIS — J301 Allergic rhinitis due to pollen: Secondary | ICD-10-CM | POA: Diagnosis not present

## 2015-10-23 ENCOUNTER — Other Ambulatory Visit: Payer: Self-pay | Admitting: Family Medicine

## 2015-10-31 ENCOUNTER — Ambulatory Visit (INDEPENDENT_AMBULATORY_CARE_PROVIDER_SITE_OTHER): Payer: Medicare Other | Admitting: Family Medicine

## 2015-10-31 ENCOUNTER — Encounter: Payer: Self-pay | Admitting: Family Medicine

## 2015-10-31 VITALS — BP 152/90 | HR 68 | Temp 97.7°F | Ht 64.0 in | Wt 147.8 lb

## 2015-10-31 DIAGNOSIS — K5900 Constipation, unspecified: Secondary | ICD-10-CM | POA: Diagnosis not present

## 2015-10-31 DIAGNOSIS — R109 Unspecified abdominal pain: Secondary | ICD-10-CM

## 2015-10-31 LAB — POCT URINALYSIS DIPSTICK
BILIRUBIN UA: NEGATIVE
Blood, UA: NEGATIVE
GLUCOSE UA: NEGATIVE
Ketones, UA: NEGATIVE
Leukocytes, UA: NEGATIVE
Nitrite, UA: NEGATIVE
Protein, UA: NEGATIVE
SPEC GRAV UA: 1.02
UROBILINOGEN UA: NEGATIVE
pH, UA: 6

## 2015-10-31 NOTE — Progress Notes (Signed)
Chief Complaint  Patient presents with  . Abdominal Pain    right sided x 7 days. No other symptoms. Dull ache. Not constipated but states that over the last week his stools have been harder-hurts mostly when he is sitting.     He complains of a dull ache on the right lateral/flank area, towards the back.  It hurts more with sitting than standing.   Tylenol or ibuprofen dulls the discomfort.  He has been taking 1/day of tylenol, only yesterday and today tried the ibuprofen. No change in pain related to bowel movements or eating.  Stomach is more grumbly than usual, feels bloated. He denies any urinary complaints--no dysuria, hematuria, urgency, frequency. Stools have been a little harder and darker (not black). Used to have a BM every morning, and now takes until the afternoon. No blood in the stool  Denies any change in diet over the last week. Denies change in fluid intake.  Of note, he restarted the citalopram about 2-3 weeks ago, starting at 1/2 tablet, and increasing to the full tablet about 2 weeks ago.  He has noted improvement, less irritable. He had recent TSH checked, which was normal. Lab Results  Component Value Date   TSH 2.147 10/03/2015   PMH, PSH, SH reviewed  Outpatient Encounter Prescriptions as of 10/31/2015  Medication Sig Note  . aspirin 81 MG tablet Take 81 mg by mouth at bedtime.    . citalopram (CELEXA) 20 MG tablet Take 1 tablet (20 mg total) by mouth daily.   . Ferrous Sulfate (IRON) 325 (65 FE) MG TABS Take 1 tablet by mouth every other day.   . ipratropium (ATROVENT) 0.03 % nasal spray Place 1 spray into both nostrils 2 (two) times daily.  10/03/2015: Uses once daily  . lisinopril-hydrochlorothiazide (PRINZIDE,ZESTORETIC) 20-12.5 MG tablet Take 1 tablet by mouth  daily   . metoprolol (LOPRESSOR) 50 MG tablet Take 1 tablet by mouth  twice a day   . omeprazole (PRILOSEC) 20 MG capsule Take 1 capsule by mouth  every other day   . SYNTHROID 50 MCG tablet Take 1  tablet by mouth  daily before breakfast   . triamcinolone cream (KENALOG) 0.1 % Apply 1 application topically 2 (two) times daily. 10/03/2015: Uses prn, usually in the winter  . EPIPEN 2-PAK 0.3 MG/0.3ML SOAJ injection Reported on 10/31/2015 10/03/2015: Received from: External Pharmacy   No facility-administered encounter medications on file as of 10/31/2015.   No Known Allergies  ROS:  No fever, chills, URI symptoms, cough, chest pain, palpitations, shortness of breath, nausea, vomiting. +constipation. See HPI.  PHYSICAL EXAM: BP 152/90 mmHg  Pulse 68  Temp(Src) 97.7 F (36.5 C) (Tympanic)  Ht 5\' 4"  (1.626 m)  Wt 147 lb 12.8 oz (67.042 kg)  BMI 25.36 kg/m2 Well appearing, pleasant male in no distress HEENT: PERRL, EOMI, conjunctiva and sclera are clear Neck: no lymphadenopathy or mass Heart: regular rate and rhythm without murmur Lungs: clear bilaterally Abdomen: normal bowel sounds. Nontender, no mass.  Very slightly distended in upper abdomen. No hepatosplenomegaly or mass Back: no spinal tenderness or CVA tenderness. No muscle tenderness or spasm. No reproducible tenderness on exam Extremities: no edema Skin: normal turgor, no rash Neuro: alert and oriented. Cranial nerves intact. Normal strength, gait. Skin: intact, normal turgor, no lesions  Urine dip normal  ASSESSMENT/PLAN:  Abdominal pain, unspecified abdominal location - Plan: POCT Urinalysis Dipstick  Constipation, unspecified constipation type  Right flank pain   Return if fever, vomiting, worsening  abdominal pain, blood or mucus in the stool, or other new symptoms develop.  Hold the iron for 1-2 weeks, to see if your constipation improves some, and then restart (at every other day).  It is possible that the citalopram also is contributing to some of the symptoms--possibly these might get better with time.  You can test this theory out by cutting the citalopram in half for a week or so and see if it improves.   It  is important to drink plenty of water, and eat a high fiber diet to keep the bowels moving regularly. Consider taking Miralax daily for the next few days until you feel like you've had good bowel movements and are emptying well.

## 2015-10-31 NOTE — Patient Instructions (Signed)
  Hold the iron for 1-2 weeks, to see if your constipation improves some, and then restart (at every other day). It is possible that the citalopram also is contributing to some of the symptoms--possibly these might get better with time.  You can test this theory out by cutting the citalopram in half for a week or so and see if it improves.   It is important to drink plenty of water, and eat a high fiber diet to keep the bowels moving regularly. Consider taking Miralax daily for the next few days until you feel like you've had good bowel movements and are emptying well. If it becomes a continuous problem (hard stools and poor emptying) consider taking a stool softener such as Colace daily (vs using Miralax more regularly). Continue the metamucil.

## 2015-11-01 DIAGNOSIS — J3089 Other allergic rhinitis: Secondary | ICD-10-CM | POA: Diagnosis not present

## 2015-11-01 DIAGNOSIS — J301 Allergic rhinitis due to pollen: Secondary | ICD-10-CM | POA: Diagnosis not present

## 2015-11-02 ENCOUNTER — Encounter: Payer: Self-pay | Admitting: Family Medicine

## 2015-11-06 ENCOUNTER — Encounter (HOSPITAL_COMMUNITY): Payer: Self-pay

## 2015-11-06 ENCOUNTER — Emergency Department (HOSPITAL_COMMUNITY)
Admission: EM | Admit: 2015-11-06 | Discharge: 2015-11-06 | Disposition: A | Discharge status: Home / Self Care | Payer: Medicare Other | Attending: Emergency Medicine | Admitting: Emergency Medicine

## 2015-11-06 ENCOUNTER — Emergency Department (HOSPITAL_COMMUNITY): Payer: Medicare Other

## 2015-11-06 DIAGNOSIS — Z8619 Personal history of other infectious and parasitic diseases: Secondary | ICD-10-CM | POA: Diagnosis not present

## 2015-11-06 DIAGNOSIS — M545 Low back pain: Secondary | ICD-10-CM | POA: Diagnosis present

## 2015-11-06 DIAGNOSIS — Z791 Long term (current) use of non-steroidal anti-inflammatories (NSAID): Secondary | ICD-10-CM | POA: Insufficient documentation

## 2015-11-06 DIAGNOSIS — Z87891 Personal history of nicotine dependence: Secondary | ICD-10-CM | POA: Diagnosis not present

## 2015-11-06 DIAGNOSIS — M5136 Other intervertebral disc degeneration, lumbar region: Secondary | ICD-10-CM | POA: Insufficient documentation

## 2015-11-06 DIAGNOSIS — Z79899 Other long term (current) drug therapy: Secondary | ICD-10-CM | POA: Insufficient documentation

## 2015-11-06 DIAGNOSIS — R109 Unspecified abdominal pain: Secondary | ICD-10-CM

## 2015-11-06 DIAGNOSIS — Z86711 Personal history of pulmonary embolism: Secondary | ICD-10-CM | POA: Insufficient documentation

## 2015-11-06 DIAGNOSIS — M549 Dorsalgia, unspecified: Secondary | ICD-10-CM | POA: Diagnosis not present

## 2015-11-06 DIAGNOSIS — I10 Essential (primary) hypertension: Secondary | ICD-10-CM | POA: Insufficient documentation

## 2015-11-06 DIAGNOSIS — E039 Hypothyroidism, unspecified: Secondary | ICD-10-CM | POA: Diagnosis not present

## 2015-11-06 DIAGNOSIS — Z7982 Long term (current) use of aspirin: Secondary | ICD-10-CM | POA: Diagnosis not present

## 2015-11-06 LAB — CBC
HEMATOCRIT: 44.7 % (ref 39.0–52.0)
HEMOGLOBIN: 15.4 g/dL (ref 13.0–17.0)
MCH: 28.7 pg (ref 26.0–34.0)
MCHC: 34.5 g/dL (ref 30.0–36.0)
MCV: 83.2 fL (ref 78.0–100.0)
Platelets: 253 10*3/uL (ref 150–400)
RBC: 5.37 MIL/uL (ref 4.22–5.81)
RDW: 13.1 % (ref 11.5–15.5)
WBC: 8.2 10*3/uL (ref 4.0–10.5)

## 2015-11-06 LAB — COMPREHENSIVE METABOLIC PANEL
ALBUMIN: 3.8 g/dL (ref 3.5–5.0)
ALK PHOS: 50 U/L (ref 38–126)
ALT: 18 U/L (ref 17–63)
ANION GAP: 13 (ref 5–15)
AST: 29 U/L (ref 15–41)
BUN: 16 mg/dL (ref 6–20)
CALCIUM: 9.6 mg/dL (ref 8.9–10.3)
CHLORIDE: 96 mmol/L — AB (ref 101–111)
CO2: 25 mmol/L (ref 22–32)
Creatinine, Ser: 1.12 mg/dL (ref 0.61–1.24)
GFR calc Af Amer: >60 mL/min (ref >60)
GFR calc non Af Amer: >60 mL/min (ref >60)
GLUCOSE: 98 mg/dL (ref 65–99)
POTASSIUM: 4.4 mmol/L (ref 3.5–5.1)
SODIUM: 134 mmol/L — AB (ref 135–145)
Total Bilirubin: 1 mg/dL (ref 0.3–1.2)
Total Protein: 7.7 g/dL (ref 6.5–8.1)

## 2015-11-06 LAB — TYPE AND SCREEN
ABO/RH(D): B POS
Antibody Screen: NEGATIVE

## 2015-11-06 LAB — POC OCCULT BLOOD, ED: FECAL OCCULT BLD: NEGATIVE

## 2015-11-06 MED ORDER — NAPROXEN 500 MG PO TABS: 500.0000 mg | ORAL_TABLET | Freq: Two times a day (BID) | ORAL | Status: DC

## 2015-11-06 MED ORDER — CYCLOBENZAPRINE HCL 10 MG PO TABS: 10.0000 mg | ORAL_TABLET | Freq: Three times a day (TID) | ORAL | Status: DC | PRN

## 2015-11-06 MED ORDER — SODIUM CHLORIDE 0.9 % IV BOLUS (SEPSIS)
1000.0000 mL | Freq: Once | INTRAVENOUS | Status: AC
  Administered 2015-11-06: 1000 mL via INTRAVENOUS

## 2015-11-06 MED ORDER — MORPHINE SULFATE (PF) 4 MG/ML IV SOLN
4.0000 mg | Freq: Once | INTRAVENOUS | Status: AC
  Administered 2015-11-06: 4 mg via INTRAVENOUS
  Filled 2015-11-06: qty 1

## 2015-11-06 MED ORDER — ONDANSETRON HCL 4 MG/2ML IJ SOLN
4.0000 mg | Freq: Once | INTRAMUSCULAR | Status: AC
  Administered 2015-11-06: 4 mg via INTRAVENOUS
  Filled 2015-11-06: qty 2

## 2015-11-06 MED ORDER — IOHEXOL 350 MG/ML SOLN
100.0000 mL | Freq: Once | INTRAVENOUS | Status: AC | PRN
  Administered 2015-11-06: 100 mL via INTRAVENOUS

## 2015-11-06 MED ORDER — OXYCODONE HCL 5 MG PO TABS: 5.0000 mg | ORAL_TABLET | ORAL | Status: DC | PRN

## 2015-11-06 NOTE — ED Provider Notes (Signed)
CSN: [id number]     Arrival date & time 11/06/15  1220 History   First MD Initiated Contact with Patient 11/06/15 1238     Chief Complaint  Patient presents with  . Back Pain  . Melena     (Consider location/radiation/quality/duration/timing/severity/associated sxs/prior Treatment) HPI Comments: 70, hx of thoracic aortic aneurysm Increasing right flank pain Black tarry stools for past week 2weeks Gradually worsening 10/10 constant for last few days Severe not radiating No hx of UTI, no urinary symptoms Tylenol doesn't seem to help Moving doesn't make it worse No radiating down leg Constipated for 1 week, stools every other day, no pain with defecation Tarry stool, no BRB, loose, 1BM 2 days ago and 1 this morning No hx of prior tarry stool Hx of recurrent cdiff s/p fecal transplant  Patient is a 82 y.o. male presenting with back pain.  Back Pain Associated symptoms: no abdominal pain, no chest pain, no dysuria, no fever and no headaches     Past Medical History  Diagnosis Date  . Hypertension   . Unspecified hypothyroidism   . Mitral valve prolapse     h/o; normal echo 12/2011 with no MVP seen  . Foot drop, right   . BPH (benign prostatic hypertrophy)   . Iron deficiency anemia, unspecified 6/09  . Hearing loss in left ear     both ears now  . Diverticulosis of colon 12/09  . Internal hemorrhoids   . GERD (gastroesophageal reflux disease)   . Allergic rhinitis, cause unspecified     on allergy shots (Dr. Orvil Feil)  . Allergic conjunctivitis   . Ruptured thoracic aortic aneurysm ([place]) 01/23/2014  . 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 Right ventricular dysfunction 01/23/2014    Secondary to obstruction of main pulmonary artery  . Hx of echocardiogram     Echo (03/2014):  Mild LVH, EF 50-55%, no RWMA, Gr 1 DD, mild MR, mild reduced RVSF  . History of Clostridium difficile     multiple times 2016--s/p fecal transplant (Dr. Baxter Flattery).   Past Surgical History   Procedure Laterality Date  . Prostate surgery  2007    photovaporization  . Cervical laminectomy  1972    C5-6  . Spine surgery  2006    L4-5 disk surgery  . Rotator cuff repair  10/2008    left; Dr. Onnie Graham  . Esophagogastroduodenoscopy  10/31/08    normal; Dr. Edison Nasuti  . Tonsillectomy    . Hemorroidal banding  04/07/2013    x3-Dr.Eric Redmond Pulling  . Hemorrhoidectomy with hemorrhoid banding  04/07/13  . Spine surgery  04/2013    L4-5, L5-S1 fusion.  Dr. Christella Noa  . Thoracic aortic aneurysm repair N/A 01/23/2014    Procedure: THORACIC ASCENDING ANEURYSM REPAIR (AAA);  Surgeon: Rexene Alberts, MD;  Location: [facility];  Service: Open Heart Surgery;  Laterality: N/A;  . Colonoscopy with propofol N/A 11/09/2014    Procedure: COLONOSCOPY WITH PROPOFOL;  Surgeon: Jerene Bears, MD;  Location: [facility];  Service: Gastroenterology;  Laterality: N/A;  . Fecal transplant N/A 11/09/2014    Procedure: FECAL TRANSPLANT;  Surgeon: Jerene Bears, MD;  Location: [facility];  Service: Gastroenterology;  Laterality: N/A;   Family History  Problem Relation Age of Onset  . Diabetes Mother   . Heart disease Mother     CHF  . Stroke Father   . Parkinsonism Father   . Cancer Brother     bladder cancer  . Cancer Brother  prostate cancer  . Emphysema Brother   . Diabetes Sister   . Colon cancer Neg Hx   . Hypertension Mother   . Hypertension Father   . Depression Mother    Social History  Substance Use Topics  . Smoking status: Former Smoker    Types: Cigarettes    Quit date: 09/01/1977  . Smokeless tobacco: Never Used  . Alcohol Use: Yes     Comment: 1-2 glasses wine per day    Review of Systems  Constitutional: Negative for fever.  HENT: Negative for sore throat.   Eyes: Negative for visual disturbance.  Respiratory: Negative for shortness of breath.   Cardiovascular: Negative for chest pain.  Gastrointestinal: Positive for blood in stool (black stool). Negative for nausea, vomiting,  abdominal pain and diarrhea.  Genitourinary: Positive for flank pain. Negative for dysuria, hematuria and difficulty urinating.  Musculoskeletal: Positive for back pain. Negative for neck stiffness.  Skin: Negative for rash.  Neurological: Negative for syncope and headaches.      Allergies  Review of patient's allergies indicates no known allergies.  Home Medications   Prior to Admission medications   Medication Sig Start Date End Date Taking? Authorizing Provider  aspirin 81 MG tablet Take 81 mg by mouth at bedtime.    Yes Historical Provider, MD  citalopram (CELEXA) 20 MG tablet Take 1 tablet (20 mg total) by mouth daily. 10/11/15  Yes Rita Ohara, MD  docusate sodium (COLACE) 100 MG capsule Take 100 mg by mouth 2 (two) times daily.   Yes Historical Provider, MD  Ferrous Sulfate (IRON) 325 (65 FE) MG TABS Take 1 tablet by mouth every other day.   Yes Historical Provider, MD  ipratropium (ATROVENT) 0.03 % nasal spray Place 2 sprays into both nostrils daily.  08/20/15  Yes Historical Provider, MD  lisinopril-hydrochlorothiazide (PRINZIDE,ZESTORETIC) 20-12.5 MG tablet Take 1 tablet by mouth  daily 09/18/15  Yes Denita Lung, MD  metoprolol (LOPRESSOR) 50 MG tablet Take 1 tablet by mouth  twice a day 10/23/15  Yes Rita Ohara, MD  omeprazole (PRILOSEC) 20 MG capsule Take 1 capsule by mouth  every other day 09/25/15  Yes Rita Ohara, MD  SYNTHROID 50 MCG tablet Take 1 tablet by mouth  daily before breakfast 10/09/15  Yes Denita Lung, MD  cyclobenzaprine (FLEXERIL) 10 MG tablet Take 1 tablet (10 mg total) by mouth 3 (three) times daily as needed for muscle spasms. 11/06/15   Gareth Morgan, MD  EPIPEN 2-PAK 0.3 MG/0.3ML SOAJ injection Reported on 10/31/2015 07/12/15   Historical Provider, MD  naproxen (NAPROSYN) 500 MG tablet Take 1 tablet (500 mg total) by mouth 2 (two) times daily with a meal. 11/06/15   Gareth Morgan, MD  oxyCODONE (ROXICODONE) 5 MG immediate release tablet Take 1 tablet (5 mg total)  by mouth every 4 (four) hours as needed for severe pain. 11/06/15   Gareth Morgan, MD  triamcinolone cream (KENALOG) 0.1 % Apply 1 application topically 2 (two) times daily. Patient not taking: Reported on 11/06/2015 08/31/15   Rita Ohara, MD   BP 178/89 mmHg  Pulse 68  Temp(Src) 97.4 F (36.3 C) (Oral)  Resp 18  Ht 5\' 4"  (1.626 m)  Wt 140 lb (63.504 kg)  BMI 24.02 kg/m2  SpO2 96% Physical Exam  Constitutional: He is oriented to person, place, and time. He appears well-developed and well-nourished. No distress.  HENT:  Head: Normocephalic and atraumatic.  Eyes: Conjunctivae and EOM are normal.  Neck: Normal range of  motion.  Cardiovascular: Normal rate, regular rhythm, normal heart sounds and intact distal pulses.  Exam reveals no gallop and no friction rub.   No murmur heard. Pulmonary/Chest: Effort normal and breath sounds normal. No respiratory distress. He has no wheezes. He has no rales.  Abdominal: Soft. He exhibits no distension. There is no tenderness. There is CVA tenderness. There is no guarding.  Musculoskeletal: He exhibits no edema.       Lumbar back: He exhibits tenderness.  Neurological: He is alert and oriented to person, place, and time.  Skin: Skin is warm and dry. He is not diaphoretic.  Nursing note and vitals reviewed.   ED Course  Procedures (including critical care time) Labs Review Labs Reviewed  COMPREHENSIVE METABOLIC PANEL - Abnormal; Notable for the following:    Sodium 134 (*)    Chloride 96 (*)    All other components within normal limits  CBC  POC OCCULT BLOOD, ED  POC OCCULT BLOOD, ED  TYPE AND SCREEN    Imaging Review Ct Angio Abd/pel W/ And/or W/o  11/06/2015  CLINICAL DATA:  Right lower posterior back pain for the past 2 weeks. History of aortic surgery 2 years ago. EXAM: CT ANGIOGRAPHY ABDOMEN AND PELVIS WITH CONTRAST TECHNIQUE: Multidetector CT imaging of the abdomen and pelvis was performed using the standard protocol during bolus  administration of intravenous contrast. Multiplanar reconstructed images including MIPs were obtained and reviewed to evaluate the vascular anatomy. CONTRAST:  168mL OMNIPAQUE IOHEXOL 350 MG/ML SOLN COMPARISON:  None. FINDINGS: Vascular Findings: Abdominal aorta: Moderate to large amount of slightly irregular mixed calcified and noncalcified atherosclerotic plaque within a tortuous but normal caliber abdominal aorta. No evidence of abdominal aortic dissection or periaortic stranding. No definitive evidence of prior operative intervention. Celiac artery: There is a moderate amount of eccentric calcified plaque involving the origin of the celiac artery, not resulting in hemodynamically significant stenosis. Conventional branching pattern. SMA: There is a moderate to large amount of eccentric mixed calcified and noncalcified atherosclerotic plaque involving the origin of the SMA, not definitely resulting in hemodynamically significant stenosis. Additionally, there is eccentric mixed calcified and noncalcified atherosclerotic plaque scattered throughout the main trunk of the SMA (representative axial images 31 34 and 37, series 4), also not definitely resulting in hemodynamically significant stenosis. Conventional branching pattern. The distal tributaries the SMA are widely patent without discrete intraluminal filling defect to suggest distal embolism. Right Renal artery: Solitary ; there is mixed calcified and noncalcified atherosclerotic plaque involving the origin of the right renal artery, not resulting in hemodynamically significant stenosis. No vessel irregularity to suggest FMD. Left Renal artery: Solitary; there is a moderate amount of eccentric mixed calcified and noncalcified atherosclerotic plaque involving the origin of the left renal artery, not definitely resulting in hemodynamically significant stenosis. No vessel irregularity to suggest FMD. IMA: Diseased at its origin though patent. Pelvic vasculature:  There is eccentric calcified atherosclerotic plaque within the tortuous but normal caliber bilateral common iliac arteries, not resulting in hemodynamically significant stenosis. The bilateral internal iliac arteries are heavily disease though patent and of normal caliber. The bilateral external iliac arteries are of normal caliber and widely patent. Review of the MIP images confirms the above findings. -------------------------------------------------------------------------------- Nonvascular Findings: Normal hepatic contour. There are innumerable punctate granulomas scattered throughout the liver compatible with prior granulomatous infection. No discrete hyper enhancing hepatic lesions. Normal appearance of the gallbladder given degree distention. No radiopaque gallstones. No intra extrahepatic biliary duct dilatation. No ascites. There is symmetric  enhancement and excretion of the bilateral kidneys no definite renal stones on this postcontrast examination. No discrete renal lesions. No urinary obstruction or perinephric stranding. Normal appearance of the bilateral adrenal glands and pancreas. There are innumerable punctate granuloma scattered throughout the otherwise normal-appearing spleen. Moderate to large colonic stool burden without evidence of enteric obstruction. Colonic diverticulosis without evidence of diverticulitis. Normal appearance of the terminal ileum and retrocecal appendix. No pneumoperitoneum, pneumatosis or portal venous gas. No bulky retroperitoneal, mesenteric, pelvic or inguinal lymphadenopathy. Normal appearance of the pelvic organs. Normal appearance of the urinary bladder given degree distention. No free fluid in the pelvic cul-de-sac. Limited visualization of lower thorax is degraded secondary to patient respiratory artifact. There is minimal dependent subpleural slightly coarsened reticular opacities without definitive evidence of honeycombing on this motion degraded examination.  Subsegmental atelectasis within the dependent portions of the bilateral lobes well the bilateral costophrenic angles. No pleural effusion. No acute or aggressive osseous lesions. Post L4-S1 paraspinal fusion and intervertebral disc space replacement. No definite evidence of hardware failure or loosening though there is incomplete bony incorporation of the L4-L5 and L5-S1 intervertebral discs replacements. Moderate multilevel lumbar spine DDD throughout the remainder of the lumbar spine, likely worse at L1-L2 with near complete disc space height loss, endplate irregularity and posteriorly directed disc osteophyte complex. Mild presumably degenerative scoliotic curvature of the lumbar spine. Regional soft tissues appear normal. IMPRESSION: Vascular Impression: 1. Moderate to large amount of eccentric mixed calcified and noncalcified atherosclerotic plaque throughout a tortuous but normal caliber abdominal aorta, not resulting in a hemodynamically significant stenosis. 2. The majority of the major branch vessels of the abdominal aorta are diseased at their origin's though without a definitive hemodynamically significant stenosis. Nonvascular Impression: 1. Sequela of prior granulomatous infection as detailed above. 2. Colonic diverticulosis without evidence of diverticulitis. 3. Potential nonspecific fibrotic change within the motion degraded evaluation of the bilateral lower thorax. Further evaluation with dedicated nonemergent ILD protocol chest CT could be performed as clinically indicated. Electronically Signed   By: Sandi Mariscal M.D.   On: 11/06/2015 16:13   I have personally reviewed and evaluated these images and lab results as part of my medical decision-making.   EKG Interpretation None      MDM   Final diagnoses:  Degenerative disc disease, lumbar   82 year old male with a history of hypertension, hypothyroidism, history of thoracic aortic repair status post rupture after developing endocarditis  and bacteremia, cdiff s/p fecal transplant, lumbar disc disease, presents with concern of right flank pain over the last week. Patient had urinalysis done through PCP which did not show any sign of infection. Patient also reports black stools at home over the last week. Rectal sample was black appearing, however was negative by Hemoccult, and patient has normal hemoglobin, and have low suspicion for GI bleed. CT angiogram was done given patient's significant flank pain, (to evaluate for signs of aneurysm, retroperitoneal bleed, less likely mesenteric ischemia, renal obstruction, colitis or other.)    CT did not show any acute findings. There is evidence of prior granulomatous disease, and question of ILD in thorax, evidence of atheroslcerosis without obstrucion, and no evidence of hydronephrosis, or other etiology of pain.  Patient has degenerative disc disease which likely represents etiology of symptoms. No symptoms other than pain at this time. Likely musculoskeletal pain.  Given rx for naproxen, flexeril, and small rx for oxycodone given severity of pain.  Recommend close PCP follow up. Discussed constipation care as well and avoidance  of narcotics if possible.    Gareth Morgan, MD 11/06/15 [phone]

## 2015-11-06 NOTE — ED Notes (Signed)
Taken to CT.

## 2015-11-06 NOTE — ED Notes (Signed)
To room via PTAR.  Onset 2 weeks right mid to lower back pain, does not radiate.  Had appt today with [place] [facility] but was not able to sit to make ride to doctors appt.  No urinary symptoms, abd pain, chest pain or shortness of breath.  Onset after back pain started, pt started having black stools.  Last stool this morning.  Pt was evaluated last week at PCP and kidney infection r/o.

## 2015-11-08 ENCOUNTER — Encounter: Payer: Self-pay | Admitting: Family Medicine

## 2015-11-08 ENCOUNTER — Ambulatory Visit (INDEPENDENT_AMBULATORY_CARE_PROVIDER_SITE_OTHER): Payer: Medicare Other | Admitting: Family Medicine

## 2015-11-08 VITALS — BP 150/100 | HR 60 | Temp 96.9°F | Ht 64.0 in | Wt 145.4 lb

## 2015-11-08 DIAGNOSIS — R109 Unspecified abdominal pain: Secondary | ICD-10-CM | POA: Diagnosis not present

## 2015-11-08 DIAGNOSIS — I10 Essential (primary) hypertension: Secondary | ICD-10-CM

## 2015-11-08 DIAGNOSIS — I700 Atherosclerosis of aorta: Secondary | ICD-10-CM

## 2015-11-08 DIAGNOSIS — K5900 Constipation, unspecified: Secondary | ICD-10-CM | POA: Diagnosis not present

## 2015-11-08 NOTE — Progress Notes (Signed)
Chief Complaint  Patient presents with  . Follow-up    ER follow up. Doesn't think he feels better-says he "is all drugged up," and he knows that if he wasn't he probably wouldn't feel better.    "I'm close to pain-free, but it is because I'm on all these medications".  He went to ER 2 days ago with same complaints of right flank pain, but also reporting black stools.  Work-up was negative (see labs, CT angio results below), felt to be musculoskeletal.  Large stool load also noted on study.  The night after ER visit he took flexeril and naproxen. As he got into bed he took an oxycontin.  Yesterday he took naproxen twice, flexeril twice.  He took an oxycontin this morning and yesterday when pain woke him up at 6am both days.  Stomach has been making a lot of noise, has had some runny stools, but not large amounts. Stomach still feels somewhat bloated, pants are tight.  He has been using miralax x 3 days, using 2 capfuls at a time.  Stools are now brown, rather than black like they had been.  He had black stools for about a week--didn't mention at his last visit here, but he thinks it had already started (he reported darker stools, but not black). He went 3-4 days without a bowel movement at all, until yesterday, when he finally started having some runny stools.   He hasn't taken iron since his last visit here, and he cut the citalopram in half.  Really didn't notice any much change.    Currently not in any pain.  Last dose of oxy was 6am.  Took naproxen and flexeril at 2pm today.  He wants to follow up with Dr. Cyndy Freeze. He plans to schedule an appointment with him.  He is very worried that his symptoms could be related to his back, given his prior experiences of severe pain throughout his body related to his back.  BP at home an hour ago was 119/72. He reports they are always high at the doctor.  Currently denies any headache or other problems.  PMH, PSH, SH reviewed.  Outpatient Encounter  Prescriptions as of 11/08/2015  Medication Sig  . aspirin 81 MG tablet Take 81 mg by mouth at bedtime.   . citalopram (CELEXA) 20 MG tablet Take 1 tablet (20 mg total) by mouth daily.  . cyclobenzaprine (FLEXERIL) 10 MG tablet Take 1 tablet (10 mg total) by mouth 3 (three) times daily as needed for muscle spasms.  Marland Kitchen ipratropium (ATROVENT) 0.03 % nasal spray Place 2 sprays into both nostrils daily.   Marland Kitchen lisinopril-hydrochlorothiazide (PRINZIDE,ZESTORETIC) 20-12.5 MG tablet Take 1 tablet by mouth  daily  . metoprolol (LOPRESSOR) 50 MG tablet Take 1 tablet by mouth  twice a day  . naproxen (NAPROSYN) 500 MG tablet Take 1 tablet (500 mg total) by mouth 2 (two) times daily with a meal.  . omeprazole (PRILOSEC) 20 MG capsule Take 1 capsule by mouth  every other day  . oxyCODONE (ROXICODONE) 5 MG immediate release tablet Take 1 tablet (5 mg total) by mouth every 4 (four) hours as needed for severe pain.  Marland Kitchen SYNTHROID 50 MCG tablet Take 1 tablet by mouth  daily before breakfast  . docusate sodium (COLACE) 100 MG capsule Take 100 mg by mouth 2 (two) times daily. Reported on 11/08/2015  . EPIPEN 2-PAK 0.3 MG/0.3ML SOAJ injection Reported on 11/08/2015  . Ferrous Sulfate (IRON) 325 (65 FE) MG TABS Take 1  tablet by mouth every other day. Reported on 11/08/2015  . triamcinolone cream (KENALOG) 0.1 % Apply 1 application topically 2 (two) times daily. (Patient not taking: Reported on 11/06/2015)   No facility-administered encounter medications on file as of 11/08/2015.   No Known Allergies  ROS: no fever, chills, headaches, dizziness, chest pain, shortness of breath, nausea, vomiting.  Bowel changes as above.  No urinary complaints. No bleeding, bruising, numbness, tingling, radiation of pain, weakness.  See HPI.  PHYSICAL EXAM: BP 150/100 mmHg  Pulse 60  Temp(Src) 96.9 F (36.1 C) (Tympanic)  Ht 5\' 4"  (1.626 m)  Wt 145 lb 6.4 oz (65.953 kg)  BMI 24.95 kg/m2  196/104 on repeat by MD  Well appearing, pleasant,  elderly male in no distress. Doesn't appear particularly anxious, although was nervous about telling me about seeing neurosurgeon (he thought I would be upset, since I didn't recommend it at this point, per MyChart messages).  HEENT: PERRL, EOMI, conjunctiva and sclera are clear. OP is clear Neck: No lymphadenopathy, thyromegaly or mass Heart: regular rate and rhythm Lungs: clear bilaterally Back: no spinal tenderness or CVA tenderness.  Area of discomfort is at the right lateral flank/abdominal area, nontender on exam Abdomen: hyperactive bowel sounds, soft, minimally distended, nontender Extremities: no edema Neuro: alert and oriented, cranial nerves intact, normal strength, gait. DTR's symmetric Skin: normal turgor, no lesions/rashes Psych: normal mood, affect, hygiene and grooming   Lab Results  Component Value Date   WBC 8.2 11/06/2015   HGB 15.4 11/06/2015   HCT 44.7 11/06/2015   MCV 83.2 11/06/2015   PLT 253 11/06/2015     Chemistry      Component Value Date/Time   NA 134* 11/06/2015 1345   K 4.4 11/06/2015 1345   CL 96* 11/06/2015 1345   CO2 25 11/06/2015 1345   BUN 16 11/06/2015 1345   CREATININE 1.12 11/06/2015 1345   CREATININE 1.26* 10/03/2015 0001      Component Value Date/Time   CALCIUM 9.6 11/06/2015 1345   ALKPHOS 50 11/06/2015 1345   AST 29 11/06/2015 1345   ALT 18 11/06/2015 1345   BILITOT 1.0 11/06/2015 1345     CT angio of abdomen/pelvis: IMPRESSION: Vascular Impression: 1. Moderate to large amount of eccentric mixed calcified and noncalcified atherosclerotic plaque throughout a tortuous but normal caliber abdominal aorta, not resulting in a hemodynamically significant stenosis. 2. The majority of the major branch vessels of the abdominal aorta are diseased at their origin's though without a definitive hemodynamically significant stenosis.  Nonvascular Impression: 1. Sequela of prior granulomatous infection as detailed above. 2. Colonic  diverticulosis without evidence of diverticulitis. 3. Potential nonspecific fibrotic change within the motion degraded evaluation of the bilateral lower thorax. Further evaluation with dedicated nonemergent ILD protocol chest CT could be performed as clinically indicated.  ASSESSMENT/PLAN:  Right flank pain - musculoskeletal, possibly some component of constipation contributing.   Atherosclerosis of aorta (HCC) - continue aspirin; check lipids. no significant blockages on CT angio - Plan: Lipid panel  Abdominal pain, unspecified abdominal location  Constipation, unspecified constipation type - continue miralax  Essential hypertension, benign - very high here, asypmtomatic, normal at home.  White coat component, overall controlled  30 min visit, more than 1/2 spent counseling.   Take the naproxen twice daily with food.  This is easiest if taken with breakfast and dinner.  Take this regularly until your pain is completely better, at least 7 days, up to the full 15 day of the prescription  only if needed. You can start cutting back on the flexeril if you want--take it only if you are having pain. If it makes you groggy, you can cut the dose in half. If you have pain in between doses of naproxen and/or flexeril, if your pain level is <7, try using tylenol first, rather than oxycodone.  Use the oxycodone after 30-60 minutes if you didn't get any relief from the tylenol, or take it first, if your pain is >7/10. Try and use the oxycodone sparingly, as this contributes to the constipation. Continue the miralax daily--once you start seeing results, you probably should cut back to just 1 capful daily (may need to use it twice daily if still taking oxycodone). Be sure to drink a lot of water, and continue to try and eat a high fiber diet. Staying active will also help with constipation.  Return fasting for a cholesterol check.  I'm concerned about the atherosclerotic plaque build-up in the aorta  and other blood vessels.  I might want to consider a low dose statin medication to help prevent further build-up.

## 2015-11-08 NOTE — Patient Instructions (Signed)
  Take the naproxen twice daily with food.  This is easiest if taken with breakfast and dinner.  Take this regularly until your pain is completely better, at least 7 days, up to the full 15 day of the prescription only if needed. You can start cutting back on the flexeril if you want--take it only if you are having pain. If it makes you groggy, you can cut the dose in half. If you have pain in between doses of naproxen and/or flexeril, if your pain level is <7, try using tylenol first, rather than oxycodone.  Use the oxycodone after 30-60 minutes if you didn't get any relief from the tylenol, or take it first, if your pain is >7/10. Try and use the oxycodone sparingly, as this contributes to the constipation. Continue the miralax daily--once you start seeing results, you probably should cut back to just 1 capful daily (may need to use it twice daily if still taking oxycodone). Be sure to drink a lot of water, and continue to try and eat a high fiber diet. Staying active will also help with constipation.  Return fasting for a cholesterol check.  I'm concerned about the atherosclerotic plaque build-up in the aorta and other blood vessels.  I might want to consider a low dose statin medication to help prevent further build-up.

## 2015-11-09 ENCOUNTER — Other Ambulatory Visit: Payer: Medicare Other

## 2015-11-09 ENCOUNTER — Encounter: Payer: Self-pay | Admitting: Family Medicine

## 2015-11-09 DIAGNOSIS — I700 Atherosclerosis of aorta: Secondary | ICD-10-CM

## 2015-11-09 LAB — LIPID PANEL
CHOL/HDL RATIO: 3.5 ratio (ref <=5.0)
Cholesterol: 190 mg/dL (ref 125–200)
HDL: 55 mg/dL (ref >=40)
LDL CALC: 111 mg/dL (ref <130)
TRIGLYCERIDES: 119 mg/dL (ref <150)
VLDL: 24 mg/dL (ref <30)

## 2015-11-12 ENCOUNTER — Encounter: Payer: Self-pay | Admitting: Family Medicine

## 2015-11-12 DIAGNOSIS — Z5181 Encounter for therapeutic drug level monitoring: Secondary | ICD-10-CM

## 2015-11-12 DIAGNOSIS — E7800 Pure hypercholesterolemia, unspecified: Secondary | ICD-10-CM

## 2015-11-12 DIAGNOSIS — Z6825 Body mass index (BMI) 25.0-25.9, adult: Secondary | ICD-10-CM | POA: Diagnosis not present

## 2015-11-12 DIAGNOSIS — I10 Essential (primary) hypertension: Secondary | ICD-10-CM | POA: Diagnosis not present

## 2015-11-12 DIAGNOSIS — M4806 Spinal stenosis, lumbar region: Secondary | ICD-10-CM | POA: Diagnosis not present

## 2015-11-12 MED ORDER — ATORVASTATIN CALCIUM 10 MG PO TABS: 10.0000 mg | ORAL_TABLET | Freq: Every day | ORAL | Status: DC

## 2015-11-26 ENCOUNTER — Encounter: Payer: Self-pay | Admitting: Family Medicine

## 2015-11-26 ENCOUNTER — Ambulatory Visit (INDEPENDENT_AMBULATORY_CARE_PROVIDER_SITE_OTHER): Payer: Medicare Other | Admitting: Family Medicine

## 2015-11-26 VITALS — BP 110/72 | HR 72 | Temp 98.2°F | Ht 64.0 in | Wt 142.6 lb

## 2015-11-26 DIAGNOSIS — E039 Hypothyroidism, unspecified: Secondary | ICD-10-CM | POA: Diagnosis not present

## 2015-11-26 DIAGNOSIS — I10 Essential (primary) hypertension: Secondary | ICD-10-CM

## 2015-11-26 DIAGNOSIS — K5900 Constipation, unspecified: Secondary | ICD-10-CM

## 2015-11-26 DIAGNOSIS — R109 Unspecified abdominal pain: Secondary | ICD-10-CM | POA: Diagnosis not present

## 2015-11-26 LAB — POCT URINALYSIS DIPSTICK
Blood, UA: NEGATIVE
Glucose, UA: NEGATIVE
KETONES UA: NEGATIVE
LEUKOCYTES UA: NEGATIVE
Nitrite, UA: NEGATIVE
PROTEIN UA: NEGATIVE
Spec Grav, UA: 1.025
Urobilinogen, UA: NEGATIVE
pH, UA: 6

## 2015-11-26 NOTE — Progress Notes (Signed)
Chief Complaint  Patient presents with  . Flank Pain    right sided flank pain, UA howed 1+ bili. Took Miralax and was feeling better, stopped Miralax and got stopped up again. Got back on the Miralax and feels like he shouldn'tbe on it forever and that something must be wrong.    Still has mild, intermittent pain in the right side, relieved by Tylenol.  Bowels had always been normal, until this past month.  He has been having problems with constipation.  Miralax helped, but recurred after stopping it.  Pain also recurred. He was only off for a couple of days, back now to taking it once daily. The consistency of his bowels is "like chunky soup"--not runny, but not solid, completely different than his norm.  Denies change in his diet or water intake.  He is less active than his norm over the last month or so.  Walking bothers his hip; has seen Dr. Onnie Graham.  He also has seen him for shoulder pain, and injection worked very well.  He slacked off in going to the gym.  He used to use metamucil daily, and hasn't been taking this..  He gets nervous with changes in his body, ever since the thoracic aorta issue, where his symptoms prior to diagnosis had been very vague.  Last colonoscopy was 2009, other than with fecal transplant 1 year ago.  Hyperlipidemia:  Started atorvastatin 2 weeks ago, so far without side effects.  PMH, PSH, SH reviewed.  Outpatient Encounter Prescriptions as of 11/26/2015  Medication Sig  . aspirin 81 MG tablet Take 81 mg by mouth at bedtime.   Marland Kitchen atorvastatin (LIPITOR) 10 MG tablet Take 1 tablet (10 mg total) by mouth daily.  . citalopram (CELEXA) 20 MG tablet Take 1 tablet (20 mg total) by mouth daily.  . cyclobenzaprine (FLEXERIL) 10 MG tablet Take 1 tablet (10 mg total) by mouth 3 (three) times daily as needed for muscle spasms.  Marland Kitchen EPIPEN 2-PAK 0.3 MG/0.3ML SOAJ injection Reported on 11/08/2015  . Ferrous Sulfate (IRON) 325 (65 FE) MG TABS Take 1 tablet by mouth every other  day. Reported on 11/08/2015  . ipratropium (ATROVENT) 0.03 % nasal spray Place 2 sprays into both nostrils daily.   Marland Kitchen lisinopril-hydrochlorothiazide (PRINZIDE,ZESTORETIC) 20-12.5 MG tablet Take 1 tablet by mouth  daily  . metoprolol (LOPRESSOR) 50 MG tablet Take 1 tablet by mouth  twice a day  . omeprazole (PRILOSEC) 20 MG capsule Take 1 capsule by mouth  every other day  . polyethylene glycol (MIRALAX / GLYCOLAX) packet Take 17 g by mouth daily.  Marland Kitchen SYNTHROID 50 MCG tablet Take 1 tablet by mouth  daily before breakfast  . docusate sodium (COLACE) 100 MG capsule Take 100 mg by mouth 2 (two) times daily. Reported on 11/26/2015  . triamcinolone cream (KENALOG) 0.1 % Apply 1 application topically 2 (two) times daily. (Patient not taking: Reported on 11/06/2015)  . [DISCONTINUED] naproxen (NAPROSYN) 500 MG tablet Take 1 tablet (500 mg total) by mouth 2 (two) times daily with a meal.  . [DISCONTINUED] oxyCODONE (ROXICODONE) 5 MG immediate release tablet Take 1 tablet (5 mg total) by mouth every 4 (four) hours as needed for severe pain.   No facility-administered encounter medications on file as of 11/26/2015.   No Known Allergies  ROS: no fever, chills, URI symptoms, cough, shortness of breath, nausea, vomiting.  +right side/flank mild discomfort, constipation.  No urinary symptoms, bleeding, bruising, rash.  See HPI  PHYSICAL EXAM: BP 110/72  mmHg  Pulse 72  Temp(Src) 98.2 F (36.8 C) (Tympanic)  Ht 5\' 4"  (1.626 m)  Wt 142 lb 9.6 oz (64.683 kg)  BMI 24.47 kg/m2 Well developed, pleasant male, mildly anxious, in no distress Neck: no lymphadenopathy, thyromegaly or mass Heart: regular rate and rhythm Lungs: clear bilaterally Back: no CVA tenderness Abdomen: soft, nontender, no organomegaly or mass Extremities: no edema Psych: normal mood, affect (mildly anxious), hygiene and grooming Neuro: alert and oriented, cranial nerves intact, normal strength, gait.  Urine dip: 1+ bilirubin, SG 1.025,  remainder is negative  Lab Results  Component Value Date   TSH 2.147 10/03/2015    ASSESSMENT/PLAN:  Flank pain - seems to be related to constipation, resolves with Miralax use - Plan: POCT Urinalysis Dipstick  Hypothyroidism, unspecified hypothyroidism type - euthyroid per recent labs; not contributing to constipation  Essential hypertension, benign - well controlled  Constipation, unspecified constipation type - counseled re: fluids, high fiber diet, regular exercise, Miralax use. Reassured.    Continue to drink plenty of fluid, and try and eat a high fiber diet. You may restart the metamucil. Continue the Miralax, but try cutting it to just 1/2 capful daily. If you feel like you aren't getting adequate results, you can increase it back to a capful. If your stools are still daily and somewhat looser than normal, you can cut back to 1/2-1 capful every other day.  It is also important that you get back to your regular exercise routine.  If walking bothers your hip, then do activities that are easier on your hip--exercise bike, swimming.  If you have ongoing problems with your bowels, you should follow up with your gastroenterologist, and consider another colonoscopy.

## 2015-11-26 NOTE — Patient Instructions (Signed)
   Continue to drink plenty of fluid, and try and eat a high fiber diet. You may restart the metamucil. Continue the Miralax, but try cutting it to just 1/2 capful daily. If you feel like you aren't getting adequate results, you can increase it back to a capful. If your stools are still daily and somewhat looser than normal, you can cut back to 1/2-1 capful every other day.  It is also important that you get back to your regular exercise routine.  If walking bothers your hip, then do activities that are easier on your hip--exercise bike, swimming.  If you have ongoing problems with your bowels, you should follow up with your gastroenterologist, and consider another colonoscopy.

## 2015-11-28 DIAGNOSIS — J301 Allergic rhinitis due to pollen: Secondary | ICD-10-CM | POA: Diagnosis not present

## 2015-11-28 DIAGNOSIS — J3089 Other allergic rhinitis: Secondary | ICD-10-CM | POA: Diagnosis not present

## 2015-12-12 DIAGNOSIS — J301 Allergic rhinitis due to pollen: Secondary | ICD-10-CM | POA: Diagnosis not present

## 2015-12-12 DIAGNOSIS — J3089 Other allergic rhinitis: Secondary | ICD-10-CM | POA: Diagnosis not present

## 2015-12-26 DIAGNOSIS — M12812 Other specific arthropathies, not elsewhere classified, left shoulder: Secondary | ICD-10-CM | POA: Diagnosis not present

## 2015-12-26 DIAGNOSIS — M12811 Other specific arthropathies, not elsewhere classified, right shoulder: Secondary | ICD-10-CM | POA: Diagnosis not present

## 2015-12-27 ENCOUNTER — Other Ambulatory Visit: Payer: Self-pay | Admitting: Family Medicine

## 2015-12-31 ENCOUNTER — Other Ambulatory Visit: Payer: Self-pay | Admitting: Family Medicine

## 2016-01-21 ENCOUNTER — Encounter: Payer: Self-pay | Admitting: Thoracic Surgery (Cardiothoracic Vascular Surgery)

## 2016-01-29 DIAGNOSIS — J3089 Other allergic rhinitis: Secondary | ICD-10-CM | POA: Diagnosis not present

## 2016-01-29 DIAGNOSIS — J301 Allergic rhinitis due to pollen: Secondary | ICD-10-CM | POA: Diagnosis not present

## 2016-02-13 DIAGNOSIS — J301 Allergic rhinitis due to pollen: Secondary | ICD-10-CM | POA: Diagnosis not present

## 2016-02-13 DIAGNOSIS — J3089 Other allergic rhinitis: Secondary | ICD-10-CM | POA: Diagnosis not present

## 2016-02-27 ENCOUNTER — Other Ambulatory Visit: Payer: Self-pay | Admitting: *Deleted

## 2016-02-27 DIAGNOSIS — J3089 Other allergic rhinitis: Secondary | ICD-10-CM | POA: Diagnosis not present

## 2016-02-27 DIAGNOSIS — I711 Thoracic aortic aneurysm, ruptured, unspecified: Secondary | ICD-10-CM

## 2016-02-27 DIAGNOSIS — J301 Allergic rhinitis due to pollen: Secondary | ICD-10-CM | POA: Diagnosis not present

## 2016-02-29 DIAGNOSIS — J301 Allergic rhinitis due to pollen: Secondary | ICD-10-CM | POA: Diagnosis not present

## 2016-02-29 DIAGNOSIS — J3089 Other allergic rhinitis: Secondary | ICD-10-CM | POA: Diagnosis not present

## 2016-03-05 DIAGNOSIS — J3089 Other allergic rhinitis: Secondary | ICD-10-CM | POA: Diagnosis not present

## 2016-03-05 DIAGNOSIS — J301 Allergic rhinitis due to pollen: Secondary | ICD-10-CM | POA: Diagnosis not present

## 2016-03-07 DIAGNOSIS — J301 Allergic rhinitis due to pollen: Secondary | ICD-10-CM | POA: Diagnosis not present

## 2016-03-07 DIAGNOSIS — J3089 Other allergic rhinitis: Secondary | ICD-10-CM | POA: Diagnosis not present

## 2016-03-19 DIAGNOSIS — J301 Allergic rhinitis due to pollen: Secondary | ICD-10-CM | POA: Diagnosis not present

## 2016-03-19 DIAGNOSIS — J3089 Other allergic rhinitis: Secondary | ICD-10-CM | POA: Diagnosis not present

## 2016-03-27 ENCOUNTER — Other Ambulatory Visit: Payer: Self-pay | Admitting: Family Medicine

## 2016-03-31 ENCOUNTER — Ambulatory Visit (INDEPENDENT_AMBULATORY_CARE_PROVIDER_SITE_OTHER): Payer: Medicare Other | Admitting: Thoracic Surgery (Cardiothoracic Vascular Surgery)

## 2016-03-31 ENCOUNTER — Ambulatory Visit
Admission: RE | Admit: 2016-03-31 | Discharge: 2016-03-31 | Disposition: A | Discharge status: Home / Self Care | Payer: Medicare Other | Source: Ambulatory Visit | Attending: Thoracic Surgery (Cardiothoracic Vascular Surgery) | Admitting: Thoracic Surgery (Cardiothoracic Vascular Surgery)

## 2016-03-31 ENCOUNTER — Encounter: Payer: Self-pay | Admitting: Thoracic Surgery (Cardiothoracic Vascular Surgery)

## 2016-03-31 VITALS — BP 200/90 | HR 60 | Resp 20

## 2016-03-31 DIAGNOSIS — I711 Thoracic aortic aneurysm, ruptured, unspecified: Secondary | ICD-10-CM

## 2016-03-31 DIAGNOSIS — I10 Essential (primary) hypertension: Secondary | ICD-10-CM | POA: Diagnosis not present

## 2016-03-31 DIAGNOSIS — I700 Atherosclerosis of aorta: Secondary | ICD-10-CM

## 2016-03-31 DIAGNOSIS — I712 Thoracic aortic aneurysm, without rupture: Secondary | ICD-10-CM | POA: Diagnosis not present

## 2016-03-31 MED ORDER — IOPAMIDOL (ISOVUE-370) INJECTION 76%
75.0000 mL | Freq: Once | INTRAVENOUS | Status: AC | PRN
  Administered 2016-03-31: 75 mL via INTRAVENOUS

## 2016-03-31 NOTE — Progress Notes (Signed)
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 He was last seen here in our office on 03/21/2015 at which time he was doing well. Over the past year the patient has continued to do very well clinically. The patient states that he records his blood pressure regularly at home, typically with systolic readings approximately 140-145 mmHg. He notes that when he goes to the doctor's office his blood pressure frequently runs somewhat higher. Clinically the patient has been doing quite well over the past year. He continues to exercise regularly. He states that he is limited primarily by pain in his hips related to degenerative arthritis. He has not had any chest pain back pain or chest tightness.   Current Outpatient Prescriptions  Medication Sig Dispense Refill  . aspirin 81 MG tablet Take 81 mg by mouth at bedtime.     Marland Kitchen atorvastatin (LIPITOR) 10 MG tablet Take 1 tablet by mouth  daily 90 tablet 0  . EPIPEN 2-PAK 0.3 MG/0.3ML SOAJ injection Reported on 11/08/2015  1  . Ferrous Sulfate (IRON) 325 (65 FE) MG TABS Take 1 tablet by mouth every other day. Reported on 11/08/2015    . ipratropium (ATROVENT) 0.03 % nasal spray Place 2 sprays into both nostrils daily.     Marland Kitchen lisinopril-hydrochlorothiazide (PRINZIDE,ZESTORETIC) 20-12.5 MG tablet Take 1 tablet by mouth  daily 90 tablet 0  . metoprolol (LOPRESSOR) 50 MG tablet Take 1 tablet by mouth  twice a day 180 tablet 1  . omeprazole  (PRILOSEC) 20 MG capsule Take 1 capsule by mouth  every other day 45 capsule 3  . SYNTHROID 50 MCG tablet Take 1 tablet by mouth  daily before breakfast 90 tablet 1  . triamcinolone cream (KENALOG) 0.1 % Apply 1 application topically 2 (two) times daily. 454 g 0   No current facility-administered medications for this visit.       Physical Exam:   BP (!) 200/90 (BP Location: Right Arm, Patient Position: Sitting, Cuff Size: Small)   Pulse 60   Resp 20   SpO2 96% Comment: RA  General:  Well appearing  Chest:   Clear to auscultation  CV:   Regular rate and rhythm without murmur  Incisions:  Completely healed  Abdomen:  Soft nontender  Extremities:  Warm and well-perfused  Diagnostic Tests:  CT ANGIOGRAPHY CHEST WITH CONTRAST  TECHNIQUE: Multidetector CT imaging of the chest was performed using the standard protocol during bolus administration of intravenous contrast. Multiplanar CT image reconstructions and MIPs were obtained to evaluate the vascular anatomy.  Creatinine was obtained on site at [facility] at [address]  Results: Creatinine 1.0 mg/dL.  CONTRAST:  75 mL Isovue 370  COMPARISON:  Prior chest CT 02/26/2015  FINDINGS: Mediastinum: Similar degree of prominence of the mediastinal lymph nodes many of which demonstrate internal calcifications consistent with sequelae of old granulomatous disease. No significant interval enlargement  or change. The thoracic esophagus is unremarkable. No mediastinal mass.  Heart/Vascular: Surgical changes of prior tube graft repair of the ascending thoracic aorta. The aortic root remains intact and within normal limits. No evidence of aneurysm or complication at the distal graft anastomosis. Two vessel aortic arch. The right brachiocephalic and left common carotid arteries arise via a common origin. The descending thoracic aorta is normal in size. The main pulmonary artery is normal in size. The heart is  enlarged. Dilatation of the right atrium and left ventricle are present. No pericardial effusion. Patient is status post median sternotomy.  Lungs/Pleura: Stable 4- 5 mm pulmonary nodule in the left lower lobe (image 63 series 5) dating back to at least September of 2015. Two year stability is consistent with benignity. Subpleural reticulation, architectural distortion and early honeycombing consistent with early pulmonary fibrosis noted in the lung bases. No significant interval progression. There is associated dependent atelectasis.  Bones/Soft Tissues: No acute fracture or aggressive appearing lytic or blastic osseous lesion. Healed median sternotomy. Right greater than left shoulder joint osteoarthritis.  Upper Abdomen: Punctate calcifications throughout the spleen and liver consistent with old granulomatous disease. Otherwise, the visualized upper abdominal organs are unremarkable.  Review of the MIP images confirms the above findings.  IMPRESSION: 1. Surgical changes of prior tube graft repair of the ascending thoracic aorta without evidence of complication or new aneurysm formation. 2. Sequelae of granulomatous disease involving the mediastinum, lungs, liver and spleen. 3. Cardiomegaly with dilated right atrium and left ventricle. 4. Changes of a pulmonary fibrosis in a pattern most consistent with usual interstitial pneumonitis. No significant interval progression compared to prior imaging. 5. Right greater than left shoulder joint osteoarthritis. Signed,  Criselda Peaches, MD  [facility]  [facility]   Electronically Signed   By: Jacqulynn Cadet M.D.   On: 03/31/2016 14:29  Impression:  Patient is doing well more than 2 years status post emergent repair of contained rupture of a mycotic aneurysm involving the ascending thoracic aorta.  The patient has long-standing hypertension and his blood  pressure remains somewhat high in our office today. I have personally reviewed the follow-up CT angiogram performed earlier today. The patient's thoracic aorta remained stable.   Plan:  The patient will continue to monitor his blood pressure closely. He is scheduled for routine follow-up appointment with his primary care physician within the next week. He will discuss whether or not any further changes in his blood pressure medications should be made at that time.  We will plan to see the patient back again in approximately 2 years with follow-up CT angiogram.  I spent in excess of 15 minutes during the conduct of this office consultation and >50% of this time involved direct face-to-face encounter with the patient for counseling and/or coordination of their care.   Valentina Gu. Roxy Manns, MD 03/31/2016 2:51 PM

## 2016-03-31 NOTE — Patient Instructions (Signed)
Continue all previous medications without any changes at this time  Continue to monitor your blood pressure regularly and report changes to your primary care physician

## 2016-04-01 NOTE — Progress Notes (Signed)
Chief Complaint  Patient presents with  . Medication Management    HTN.  discuss Lipitor- continue? had elevated reading at doctor's office couple of days ago.  one note of 171/91 with lightheadedness. Pt states he has been having occasional headaches with lightheadedness. BP readings per pt home monitor 122/77 to 171/91. c/o bilateral hip pain.    Hypertension follow-up: His BP machine has been verified as accurate in the past. BP was very high when at f/u with Dr. Ricard Dillon recently. He sees Dr. Ricard Dillon yearly. 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  He hasn't had any problems related to this and recent scans were normal. He does report that BP often runs high when he is at the doctor, tends to be lower at home, but with a lot of variability.  Sometimes he feels a slight headache and dizziness when the BP is very high.  He denies chest pain, shortness of breath. His BP at home has been running: Ranges from 115-171/71-101, with values in the 120's, 130's, 150's and 2 in the 170's.  Pulse ranges from 49-61.  BP had been under 156 from May through 7/21, more consistently high since 7/21.  Denies any dietary changes. Notes show he felt lightheaded when 171/101 on 7/31.  He checked his blood pressure twice today, morning was 175/99, repeated about 2-2.5 hours ago and was 122/77.  He goes to the gym twice a week (he used to go 4x/week).  Hasn't documented which blood pressure were on days he exercised vs not. He used to walk regularly, but hasn't been, due to hip pain. (see below)  He eats [employer] for lunch once a month (when his wife gets her hair done). He tries to eat a low sodium diet in general.  He salts tomatoes, not much else.  They eat out once a week [facility], admits to being a "meat and potatoes" guy).   He has  been getting regular exercise, but less than in the past. He hasn't had any recurrent problems with shoulder pain since bilateral cortisone shots by Dr. Onnie Graham (had two injections). He no longer lifts weights overhead.  He saw Dr. Onnie Graham in the past for his hips, told he has arthritis. He is frustrated that he hasn't been able to walk. After 10 minutes of walking he has pain in the lateral hips, not at the groin.  He used to walk 1-2 miles daily, and the thinks the lack of walking might be contributing to his higher blood pressures. Taking Tylenol prior to walking used to help, but no longer does.  He hasn't tried taking 2 tylenol yet.  Doesn't have hip pain except related to walking.  Hyperlipidemia: Started atorvastatin in March, and has been tolerating it without side effects. He never returned in May to have LFT's and lipids rechecked since being on the statin.  No further problem with diarrhea/C.diff (had fecal transplant). Bowels are normal.  Depression: He stopped taking citalopram for at least 9-10 months prior to his wellness exam in February, denied any recurrent problems with depression at his visit, but shortly after visit restarted taking it. He had emailed a week later stating that he realized he argues with [name] a lot and  "I also spend a lot of time asking myself what is the point of it all etc. I am also pretty lethargic".  He went back on it for quite a few months, but stopped it again 6-8 weeks ago. He didn't really think it was helping much, still arguing some "we are two 81 year olds, isn't some of this typical?" He also reports now that some of those symptoms got better--less lethargic, less irritable.  "it is very confusing".   "I don't know how I'm supposed to feel". Seems to be struggling with aging process.  Right flank pain in March--this was severe at first, in March, improved with flexeril and naproxen, but had some persistent pain which he related somewhat to  constipation. He feels like it must have been a stone, as pain was always gone by the end of the day. Workup was negative (in office and ER).  He hasn't had any further problems with the right flank pain since March.  GERD: He continues to take omeprazole every other day with good results. He has h/o iron deficiency, and he continues to take iron every other day on the days he doesn't take the omeprazole. He has had recurrence of reflux when PPI was stopped, and recurrence of anemia when iron was stopped in the past. Denies dysphagia.  Hypothyroidism: Denies any symptoms--no changes in energy, weight, moods, hair/skin/bowels. Takes medication on an empty stomach, separate from other medications.  PMH, PSH, SH reviewed  Outpatient Encounter Prescriptions as of 04/02/2016  Medication Sig  . aspirin 81 MG tablet Take 81 mg by mouth at bedtime.   Marland Kitchen atorvastatin (LIPITOR) 10 MG tablet Take 1 tablet by mouth  daily  . EPIPEN 2-PAK 0.3 MG/0.3ML SOAJ injection Reported on 11/08/2015  . Ferrous Sulfate (IRON) 325 (65 FE) MG TABS Take 1 tablet by mouth every other day. Reported on 11/08/2015  . ipratropium (ATROVENT) 0.03 % nasal spray Place 2 sprays into both nostrils daily.   Marland Kitchen lisinopril-hydrochlorothiazide (PRINZIDE,ZESTORETIC) 20-12.5 MG tablet Take 1 tablet by mouth  daily  . metoprolol (LOPRESSOR) 50 MG tablet Take 1 tablet by mouth  twice a day  . omeprazole (PRILOSEC) 20 MG capsule Take 1 capsule by mouth  every other day  . SYNTHROID 50 MCG tablet Take 1 tablet by mouth  daily before breakfast  . triamcinolone cream (KENALOG) 0.1 % Apply 1 application topically 2 (two) times daily.   No facility-administered encounter medications on file as of 04/02/2016.    No Known Allergies  ROS: no fever, chills, URI symptoms, headaches, dizziness (just once with high BP as per HPI). No chest pain, palpitations, shortness of breath. No nausea, vomiting, diarrhea, bleeding, bruising, rash.  Denies  depression, some mild irritability. No longer complaining of fatigue.  +hip pain with walking, see HPI.   PHYSICAL EXAM:  BP (!) 150/80   Pulse 60   Resp 16   Ht 5\' 3"  (1.6 m)   Wt 144 lb 6.4 oz (65.5 kg)   BMI 25.58 kg/m  200/94 on repeat by MD 150/100 when laying down  Well developed, pleasant male, mildly anxious/excitable, in no distress HEENT: PERRL, EOMI, conjunctiva and sclera are clear, OP clear Neck: no lymphadenopathy, thyromegaly or mass, no carotid bruit Heart: regular rate and rhythm Lungs: clear bilaterally Back: no CVA tenderness Abdomen: soft, nontender, no organomegaly or mass Extremities: no edema, normal pulses.  He has FROM of both hips without pain.   Some discomfort with IT band stretching nontender over trochanteric bursae Psych: normal mood, affect, hygiene and grooming Neuro: alert and oriented, cranial nerves intact, normal strength, gait. Skin: normal turgor, no  lesions  ASSESSMENT/PLAN:   Essential hypertension, benign - wide fluctuations, recently higher. Regular exercise, monitoring, low sodium diet. May need higher HCT. f/u 4-6 wks with BP list w/comments  Hypothyroidism, unspecified hypothyroidism type - euthyroid by history - Plan: TSH  Gastroesophageal reflux disease, esophagitis presence not specified - controlled  Iron deficiency anemia  Atherosclerosis of aorta (HCC)  Pure hypercholesterolemia - due for lipids/LFT's since starting statin  Depression, major, in remission ([place]) - no improvement with SSRI; encouraged counseling   It is okay to take 2 tylenol (extra strength or Arthritis) prior to walking to see if this makes your walking less painful. Try changing the shoes you walk in--ensuring there is a good arch support, they aren't too warn, consider new ones. Try doing the stretches of the IT band as shown (crossing legs and touching toes, as well as extending the leg over the edge of the bed as shown). If you continue to have  this pain, please call for referral for physical therapy.  I strongly recommend counseling to help you with your trouble and concerns related to aging and arguing. This is likely a better idea than more medications. Okay to remain off the citalopram at this point.   Add TSH to lipids/LFT's to be done when he returns fasting.  He recently filled lipitor and thyroid--will need another rx sent to [facility]. Advised that I prefer to wait for lab results and to remind US of the need for refill after we give him the results.  Your blood pressure was high in the office, but normal just a few hours prior. I agree that your decreased level of exercise (due to not walking) may be contributing to the higher blood pressures. Let's see if we can get your back walking (even if just 15 minutes twice a day) and continue to monitor your blood pressure. Drop off your list of blood pressure when you return for your labs and/or again in 2 weeks.  If they remain [id number] systolic, I will adjust the HCTZ dose in your lisinopril HCT combination. For now, leave it the same, try and exercise, continue to monitor, and limit the sodium in your diet. Use the tylenol as needed for pain (pain also contributes to higher blood pressure). Don't check BP when arguing with your wife--if you do, please note that in the comments, and recheck later that day.

## 2016-04-02 ENCOUNTER — Ambulatory Visit (INDEPENDENT_AMBULATORY_CARE_PROVIDER_SITE_OTHER): Payer: Medicare Other | Admitting: Family Medicine

## 2016-04-02 ENCOUNTER — Encounter: Payer: Self-pay | Admitting: Family Medicine

## 2016-04-02 VITALS — BP 150/80 | HR 60 | Resp 16 | Ht 63.0 in | Wt 144.4 lb

## 2016-04-02 DIAGNOSIS — J3089 Other allergic rhinitis: Secondary | ICD-10-CM | POA: Diagnosis not present

## 2016-04-02 DIAGNOSIS — J301 Allergic rhinitis due to pollen: Secondary | ICD-10-CM | POA: Diagnosis not present

## 2016-04-02 DIAGNOSIS — K219 Gastro-esophageal reflux disease without esophagitis: Secondary | ICD-10-CM

## 2016-04-02 DIAGNOSIS — I700 Atherosclerosis of aorta: Secondary | ICD-10-CM

## 2016-04-02 DIAGNOSIS — E7800 Pure hypercholesterolemia, unspecified: Secondary | ICD-10-CM | POA: Diagnosis not present

## 2016-04-02 DIAGNOSIS — E039 Hypothyroidism, unspecified: Secondary | ICD-10-CM

## 2016-04-02 DIAGNOSIS — F325 Major depressive disorder, single episode, in full remission: Secondary | ICD-10-CM

## 2016-04-02 DIAGNOSIS — D509 Iron deficiency anemia, unspecified: Secondary | ICD-10-CM

## 2016-04-02 DIAGNOSIS — I10 Essential (primary) hypertension: Secondary | ICD-10-CM | POA: Diagnosis not present

## 2016-04-02 NOTE — Patient Instructions (Addendum)
  It is okay to take 2 tylenol (extra strength or Arthritis) prior to walking to see if this makes your walking less painful. Try changing the shoes you walk in--ensuring there is a good arch support, they aren't too warn, consider new ones. Try doing the stretches of the IT band as shown (crossing legs and touching toes, as well as extending the leg over the edge of the bed as shown). If you continue to have this pain, please call for referral for physical therapy.  I strongly recommend counseling to help you with your trouble and concerns related to aging and arguing. This is likely a better idea than more medications. Okay to remain off the citalopram at this point.   Your blood pressure was high in the office, but normal just a few hours prior. I agree that your decreased level of exercise (due to not walking) may be contributing to the higher blood pressures. Let's see if we can get your back walking (even if just 15 minutes twice a day) and continue to monitor your blood pressure. Drop off your list of blood pressure when you return for your labs and/or again in 2 weeks.  If they remain [id number] systolic, I will adjust the HCTZ dose in your lisinopril HCT combination. For now, leave it the same, try and exercise, continue to monitor, and limit the sodium in your diet. Use the tylenol as needed for pain (pain also contributes to higher blood pressure). Don't check BP when arguing with your wife--if you do, please note that in the comments, and recheck later that day.

## 2016-04-04 ENCOUNTER — Other Ambulatory Visit: Payer: Self-pay

## 2016-04-04 ENCOUNTER — Encounter: Payer: Self-pay | Admitting: Family Medicine

## 2016-04-04 DIAGNOSIS — M25552 Pain in left hip: Principal | ICD-10-CM

## 2016-04-04 DIAGNOSIS — M25551 Pain in right hip: Secondary | ICD-10-CM

## 2016-04-08 ENCOUNTER — Other Ambulatory Visit: Payer: Self-pay | Admitting: Family Medicine

## 2016-04-08 ENCOUNTER — Other Ambulatory Visit: Payer: Medicare Other

## 2016-04-08 ENCOUNTER — Telehealth: Payer: Self-pay | Admitting: Family Medicine

## 2016-04-08 DIAGNOSIS — E039 Hypothyroidism, unspecified: Secondary | ICD-10-CM | POA: Diagnosis not present

## 2016-04-08 DIAGNOSIS — E7800 Pure hypercholesterolemia, unspecified: Secondary | ICD-10-CM

## 2016-04-08 DIAGNOSIS — Z5181 Encounter for therapeutic drug level monitoring: Secondary | ICD-10-CM

## 2016-04-08 LAB — TSH: TSH: 2.65 m[IU]/L (ref 0.40–4.50)

## 2016-04-08 LAB — HEPATIC FUNCTION PANEL
ALBUMIN: 4 g/dL (ref 3.6–5.1)
ALK PHOS: 58 U/L (ref 40–115)
ALT: 14 U/L (ref 9–46)
AST: 23 U/L (ref 10–35)
BILIRUBIN INDIRECT: 0.6 mg/dL (ref 0.2–1.2)
BILIRUBIN TOTAL: 0.8 mg/dL (ref 0.2–1.2)
Bilirubin, Direct: 0.2 mg/dL (ref <=0.2)
Total Protein: 7.3 g/dL (ref 6.1–8.1)

## 2016-04-08 LAB — LIPID PANEL
Cholesterol: 149 mg/dL (ref 125–200)
HDL: 59 mg/dL (ref >=40)
LDL CALC: 72 mg/dL (ref <130)
Total CHOL/HDL Ratio: 2.5 Ratio (ref <=5.0)
Triglycerides: 90 mg/dL (ref <150)
VLDL: 18 mg/dL (ref <30)

## 2016-04-08 NOTE — Telephone Encounter (Signed)
Please be sure he knows I'm on vacation and that they won't be seen until next week.  If he has a concern, or wants them reviewed sooner, then someone needs to type in the range of values on his list for me to review, or scan the list in and forward to me

## 2016-04-08 NOTE — Telephone Encounter (Signed)
Pt dropped off blood pressure readings, put in your folder,

## 2016-04-09 ENCOUNTER — Encounter: Payer: Self-pay | Admitting: Family Medicine

## 2016-04-09 NOTE — Telephone Encounter (Signed)
Secure email received.  BP fluctuating from 115/71 (with frequent Q000111Q systolic) up to [id number] (commented on feeling light headed with BP of 171/101 once). Pulse 49-62.  Looks like these are back down to where they had been running (with fluctuations, sometimes high, but overall ok). Continue to monitor on same meds.  Sending pt a message.

## 2016-04-09 NOTE — Telephone Encounter (Signed)
I informed him that you was on vacation and it would be next week before you went over them, he said that would be fine, I should have put that in the note, Juliann Pulse is going to e-mail it you just via [facility] e-mail, so you can go ahead and look over them,

## 2016-04-14 ENCOUNTER — Ambulatory Visit: Payer: Medicare Other | Attending: Family Medicine

## 2016-04-14 DIAGNOSIS — M25551 Pain in right hip: Secondary | ICD-10-CM

## 2016-04-14 DIAGNOSIS — M25552 Pain in left hip: Secondary | ICD-10-CM | POA: Insufficient documentation

## 2016-04-14 DIAGNOSIS — M25652 Stiffness of left hip, not elsewhere classified: Secondary | ICD-10-CM | POA: Diagnosis not present

## 2016-04-14 DIAGNOSIS — M25651 Stiffness of right hip, not elsewhere classified: Secondary | ICD-10-CM | POA: Diagnosis not present

## 2016-04-14 NOTE — Therapy (Signed)
[place], [place], [address] Phone: [phone]   Fax:  [phone]  Physical Therapy Evaluation  Patient Details  Name: Danny Frederick MRN: [id number] Date of Birth: 1933/12/03 Referring Provider: Rita Ohara, MD  Encounter Date: 04/14/2016      PT End of Session - 04/14/16 1229    Visit Number 1   Number of Visits 12   Date for PT Re-Evaluation 05/26/16   Authorization Type Medicare   Authorization Time Period KX at visit 15 , Note at 59   PT Start Time 1230   PT Stop Time 1320   PT Time Calculation (min) 50 min   Activity Tolerance Patient tolerated treatment well   Behavior During Therapy [facility] for tasks assessed/performed      Past Medical History:  Diagnosis Date  . Allergic conjunctivitis   . Allergic rhinitis, cause unspecified    on allergy shots (Dr. Orvil Feil)  . Atherosclerosis of aorta (HCC)    noted on CT angio of abd/pelvis, in many vessels  . BPH (benign prostatic hypertrophy)   . Diverticulosis of colon 12/09  . Foot drop, right   . GERD (gastroesophageal reflux disease)   . Hearing loss in left ear    both ears now  . History of Clostridium difficile    multiple times 2016--s/p fecal transplant (Dr. Baxter Flattery).  Marland Kitchen Hx of echocardiogram    Echo (03/2014):  Mild LVH, EF 50-55%, no RWMA, Gr 1 DD, mild MR, mild reduced RVSF  . Hypertension   . Internal hemorrhoids   . Iron deficiency anemia, unspecified 6/09  . Mitral valve prolapse    h/o; normal echo 12/2011 with no MVP seen  . 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 Right ventricular dysfunction 01/23/2014   Secondary to obstruction of main pulmonary artery  . Ruptured thoracic aortic aneurysm ([place]) 01/23/2014  . Unspecified hypothyroidism     Past Surgical History:  Procedure Laterality Date  . CERVICAL LAMINECTOMY  1972   C5-6  . COLONOSCOPY WITH PROPOFOL N/A 11/09/2014   Procedure: COLONOSCOPY WITH PROPOFOL;  Surgeon:  Jerene Bears, MD;  Location: [facility];  Service: Gastroenterology;  Laterality: N/A;  . ESOPHAGOGASTRODUODENOSCOPY  10/31/08   normal; Dr. Edison Nasuti  . FECAL TRANSPLANT N/A 11/09/2014   Procedure: FECAL TRANSPLANT;  Surgeon: Jerene Bears, MD;  Location: [facility];  Service: Gastroenterology;  Laterality: N/A;  . HEMORRHOIDECTOMY WITH HEMORRHOID BANDING  04/07/13  . hemorroidal banding  04/07/2013   x3-Dr.Eric Redmond Pulling  . PROSTATE SURGERY  2007   photovaporization  . ROTATOR CUFF REPAIR  10/2008   left; Dr. Onnie Graham  . SPINE SURGERY  2006   L4-5 disk surgery  . SPINE SURGERY  04/2013   L4-5, L5-S1 fusion.  Dr. Christella Noa  . THORACIC AORTIC ANEURYSM REPAIR N/A 01/23/2014   Procedure: THORACIC ASCENDING ANEURYSM REPAIR (AAA);  Surgeon: Rexene Alberts, MD;  Location: [address];  Service: Open Heart Surgery;  Laterality: N/A;  . TONSILLECTOMY      There were no vitals filed for this visit.       Subjective Assessment - 04/14/16 1237    Subjective He reports 2 years ago he egan to have lateral hip pain and pain worsened limiting activity /exercise. He saw ortho MD but had a aortic rupture after this . He has tried to walk since then and  saw Dr Onnie Graham and reported from xrays no hip issues. Dr Tomi Bamberger she was given HEP and was not  sure he was doing them correctly he asked for PT.    Pertinent History ruptured aorta, lumbar surgeries x 3 ( last in 2014)   Limitations Walking   How long can you sit comfortably? As long as needed   How long can you stand comfortably? As needed   How long can you walk comfortably? 15 min max , 5-10 most times.  Can walk for normal activity without problem   Diagnostic tests Xrays:    Patient Stated Goals To be able to walk for exercise without pain.    Currently in Pain? No/denies  just when walking            [facility] PT Assessment - 04/14/16 1233      Assessment   Medical Diagnosis bilateral hip pain   Referring Provider Rita Ohara, MD   Onset Date/Surgical Date --   > 1 year ago   Next MD Visit As needed   Prior Therapy No PT in past for hip pain     Precautions   Precautions None     Restrictions   Weight Bearing Restrictions No     Balance Screen   Has the patient fallen in the past 6 months No   Has the patient had a decrease in activity level because of a fear of falling?  No   Is the patient reluctant to leave their home because of a fear of falling?  No     Prior Function   Level of Independence Independent     Cognition   Overall Cognitive Status Within Functional Limits for tasks assessed     ROM / Strength   AROM / PROM / Strength AROM;Strength     AROM   Overall AROM Comments hip IR  10-15 degrees bilateral   ER WFL in prone   AROM Assessment Site Lumbar   Lumbar Flexion 65   Lumbar Extension 10   Lumbar - Right Side Bend 10   Lumbar - Left Side Bend 15     Strength   Overall Strength Within functional limits for tasks performed   Overall Strength Comments Both LE normal     Flexibility   Soft Tissue Assessment /Muscle Length yes   Hamstrings SLR 55 degrees                           PT Education - 04/14/16 1329    Education provided Yes   Education Details POC , HEP , stop if has back pain    Person(s) Educated Patient   Methods Explanation;Demonstration;Tactile cues;Verbal cues;Handout   Comprehension Returned demonstration;Verbalized understanding          PT Short Term Goals - 04/14/16 1230      PT SHORT TERM GOAL #1   Title He will be independent with inital HEP   Time 3   Period Weeks   Status New     PT SHORT TERM GOAL #2   Title He will report pain decreased 25% or more with walking 15 min or more   Time 3   Status New           PT Long Term Goals - 04/14/16 1325      PT LONG TERM GOAL #1   Title He will demo all HEP issued    Time 6   Period Weeks   Status New     PT LONG TERM GOAL #2   Title She will report  pain decr 75% or more with  walking  for 30 min or  more   Time 6   Period Weeks   Status New               Plan - 04/14/16 1230    Clinical Impression Statement Mr Plate presents for low complexity eval with reports of bilateral hip pain with walking greater than 10-15 min. He has stiffness of hips and spine and abnormal posture.  He should improve his walking tolerance  post PT   Rehab Potential Good   PT Frequency 2x / week   PT Duration 6 weeks   PT Treatment/Interventions Cryotherapy;Iontophoresis 4mg /ml Dexamethasone;Moist Heat;Ultrasound;Passive range of motion;Manual techniques;Therapeutic exercise;Therapeutic activities   PT Next Visit Plan HEP and stretching, manual for hip IR hamstrings and IT band, modalities   PT Home Exercise Plan IT band stretch ,hamstrings    Consulted and Agree with Plan of Care Patient      Patient will benefit from skilled therapeutic intervention in order to improve the following deficits and impairments:  Pain, Decreased strength, Decreased range of motion, Increased muscle spasms, Decreased activity tolerance  Visit Diagnosis: Pain in left hip - Plan: PT plan of care cert/re-cert  Pain in right hip - Plan: PT plan of care cert/re-cert  Stiffness of right hip, not elsewhere classified - Plan: PT plan of care cert/re-cert  Stiffness of left hip, not elsewhere classified - Plan: PT plan of care cert/re-cert      G-Codes - Q000111Q 1331    Functional Assessment Tool Used clinical judgement   Functional Limitation Mobility: Walking and moving around   Mobility: Walking and Moving Around Current Status [phone]) At least 40 percent but less than 60 percent impaired, limited or restricted   Mobility: Walking and Moving Around Goal Status [phone]) At least 20 percent but less than 40 percent impaired, limited or restricted       Problem List Patient Active Problem List   Diagnosis Date Noted  . Atherosclerosis of aorta ([place]) 11/08/2015  . Recurrent Clostridium difficile diarrhea   .  Bacteremia   . Ruptured thoracic aortic aneurysm ([place]) 01/23/2014  . 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 Right ventricular dysfunction 01/23/2014  . Solitary pulmonary nodule 12/08/2013  . Allergic conjunctivitis 10/20/2012  . Syncope 01/22/2012  . Hypothyroidism 01/22/2011  . Essential hypertension, benign 01/22/2011  . GERD (gastroesophageal reflux disease) 01/22/2011  . Iron deficiency anemia 01/22/2011  . IRON DEFICIENCY ANEMIA SECONDARY TO BLOOD LOSS 11/08/2008  . ANEMIA 05/26/2008  . Essential hypertension 03/02/2008  . DYSPNEA 03/02/2008    Darrel Hoover  PT 04/14/2016, 1:33 PM  [facility] [address] [place], [place], [address] Phone: [phone]   Fax:  [phone]  Name: Danny Frederick MRN: [id number] Date of Birth: 1933/10/27

## 2016-04-16 DIAGNOSIS — J3089 Other allergic rhinitis: Secondary | ICD-10-CM | POA: Diagnosis not present

## 2016-04-16 DIAGNOSIS — J301 Allergic rhinitis due to pollen: Secondary | ICD-10-CM | POA: Diagnosis not present

## 2016-04-17 ENCOUNTER — Encounter: Payer: Self-pay | Admitting: Family Medicine

## 2016-04-17 MED ORDER — METOPROLOL TARTRATE 50 MG PO TABS: 50.0000 mg | ORAL_TABLET | Freq: Two times a day (BID) | ORAL | 1 refills | Status: DC

## 2016-04-21 ENCOUNTER — Ambulatory Visit: Payer: Medicare Other

## 2016-04-21 DIAGNOSIS — M25652 Stiffness of left hip, not elsewhere classified: Secondary | ICD-10-CM | POA: Diagnosis not present

## 2016-04-21 DIAGNOSIS — M25651 Stiffness of right hip, not elsewhere classified: Secondary | ICD-10-CM | POA: Diagnosis not present

## 2016-04-21 DIAGNOSIS — M25552 Pain in left hip: Secondary | ICD-10-CM | POA: Diagnosis not present

## 2016-04-21 DIAGNOSIS — M25551 Pain in right hip: Secondary | ICD-10-CM

## 2016-04-21 NOTE — Patient Instructions (Addendum)
Issued from cabinet knee to chest stretching RT and LT 30 sec x 2-3 after stretching at home.  Increase walking by no more than 2 min going out to incrementally incr walking without pain. Also used model to educate why the tightness and exercise may help walking and cause some back pain

## 2016-04-21 NOTE — Therapy (Addendum)
[facility] [address] [place], [place], [address] Phone: [phone]   Fax:  [phone]  Physical Therapy Treatment/Discharge  Patient Details  Name: Danny Frederick MRN: [id number] Date of Birth: [birth year] Referring Provider: [name], MD  Encounter Date: 04/21/2016      PT End of Session - 04/21/16 0846    Visit Number 2   Number of Visits 12   Date for PT Re-Evaluation 05/26/16   Authorization Type Medicare   Authorization Time Period KX at visit 15 , Note at 10   PT Start Time 0845   PT Stop Time 0925   PT Time Calculation (min) 40 min   Activity Tolerance Patient tolerated treatment well   Behavior During Therapy [facility] for tasks assessed/performed      Past Medical History:  Diagnosis Date  . Allergic conjunctivitis   . Allergic rhinitis, cause unspecified    on allergy shots (Dr. Barnetta Chapel)  . Atherosclerosis of aorta (HCC)    noted on CT angio of abd/pelvis, in many vessels  . BPH (benign prostatic hypertrophy)   . Diverticulosis of colon 12/09  . Foot drop, right   . GERD (gastroesophageal reflux disease)   . Hearing loss in left ear    both ears now  . History of Clostridium difficile    multiple times 2016--s/p fecal transplant (Dr. Drue Second).  Marland Kitchen Hx of echocardiogram    Echo (03/2014):  Mild LVH, EF 50-55%, no RWMA, Gr 1 DD, mild MR, mild reduced RVSF  . Hypertension   . Internal hemorrhoids   . Iron deficiency anemia, unspecified 6/09  . Mitral valve prolapse    h/o; normal echo 12/2011 with no MVP seen  . 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 Right ventricular dysfunction 01/23/2014   Secondary to obstruction of main pulmonary artery  . Ruptured thoracic aortic aneurysm (HCC) 01/23/2014  . Unspecified hypothyroidism     Past Surgical History:  Procedure Laterality Date  . CERVICAL LAMINECTOMY  1972   C5-6  . COLONOSCOPY WITH PROPOFOL N/A 11/09/2014   Procedure: COLONOSCOPY WITH PROPOFOL;   Surgeon: Beverley Fiedler, MD;  Location: [facility];  Service: Gastroenterology;  Laterality: N/A;  . ESOPHAGOGASTRODUODENOSCOPY  10/31/08   normal; Dr. Gerilyn Pilgrim  . FECAL TRANSPLANT N/A 11/09/2014   Procedure: FECAL TRANSPLANT;  Surgeon: Beverley Fiedler, MD;  Location: [facility];  Service: Gastroenterology;  Laterality: N/A;  . HEMORRHOIDECTOMY WITH HEMORRHOID BANDING  04/07/13  . hemorroidal banding  04/07/2013   x3-Dr.Eric Andrey Campanile  . PROSTATE SURGERY  2007   photovaporization  . ROTATOR CUFF REPAIR  10/2008   left; Dr. Rennis Chris  . SPINE SURGERY  2006   L4-5 disk surgery  . SPINE SURGERY  04/2013   L4-5, L5-S1 fusion.  Dr. Franky Macho  . THORACIC AORTIC ANEURYSM REPAIR N/A 01/23/2014   Procedure: THORACIC ASCENDING ANEURYSM REPAIR (AAA);  Surgeon: Purcell Nails, MD;  Location: [facility];  Service: Open Heart Surgery;  Laterality: N/A;  . TONSILLECTOMY      There were no vitals filed for this visit.      Subjective Assessment - 04/21/16 0854    Subjective He feels the stretching has helped with walking tolerance (he is able to walk up hill that he was not able to do without pain) as he can walk further without pain, (10 min )  but his back in lower  spine has begun to hurt post exercise and he reports feeling odd in that  area he  states as numbness but is more the odd feeling.  PAin mostly after stretching but less so during    Currently in Pain? No/denies       Reviewed all HEP and helped to modify to decr pull to lower back                          PT Education - 04/21/16 0925    Education provided Yes   Education Details knee to chest stretch,  modification of HEP to decrease pul to de cr strain to spine.    Methods Explanation;Verbal cues;Handout;Tactile cues   Comprehension Verbalized understanding;Returned demonstration          PT Short Term Goals - 04/21/16 0929      PT SHORT TERM GOAL #1   Title He will be independent with inital HEP   Status Achieved            PT Long Term Goals - 04/14/16 1325      PT LONG TERM GOAL #1   Title He will demo all HEP issued    Time 6   Period Weeks   Status New     PT LONG TERM GOAL #2   Title She will report pain decr 75% or more with  walking  for 30 min or more   Time 6   Period Weeks   Status New               Plan - 04/21/16 0847    Clinical Impression Statement Mr Sergi reports pain and odd feeling in back with stretchign so he is concerned about if he should do the stretching. No pain with stretching today.    PT Treatment/Interventions Cryotherapy;Iontophoresis 4mg /ml Dexamethasone;Moist Heat;Ultrasound;Passive range of motion;Manual techniques;Therapeutic exercise;Therapeutic activities   PT Next Visit Plan Add core stability if no pain and walking increased painfree. He may cancel next appoinment to give incr time to assess if exerciises causing any ongoing pain.    Consulted and Agree with Plan of Care Patient      Patient will benefit from skilled therapeutic intervention in order to improve the following deficits and impairments:  Pain, Decreased strength, Decreased range of motion, Increased muscle spasms, Decreased activity tolerance  Visit Diagnosis: Pain in left hip  Pain in right hip  Stiffness of right hip, not elsewhere classified  Stiffness of left hip, not elsewhere classified     Problem List Patient Active Problem List   Diagnosis Date Noted  . Atherosclerosis of aorta (HCC) 11/08/2015  . Recurrent Clostridium difficile diarrhea   . Bacteremia   . Ruptured thoracic aortic aneurysm (HCC) 01/23/2014  . 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 Right ventricular dysfunction 01/23/2014  . Solitary pulmonary nodule 12/08/2013  . Allergic conjunctivitis 10/20/2012  . Syncope 01/22/2012  . Hypothyroidism 01/22/2011  . Essential hypertension, benign 01/22/2011  . GERD (gastroesophageal reflux disease) 01/22/2011  . Iron deficiency anemia  01/22/2011  . IRON DEFICIENCY ANEMIA SECONDARY TO BLOOD LOSS 11/08/2008  . ANEMIA 05/26/2008  . Essential hypertension 03/02/2008  . DYSPNEA 03/02/2008    Danny Frederick  PT 04/21/2016, 9:32 AM  [facility] [address] [place], [place], [address] Phone: [phone]   Fax:  [phone]  Name: Danny Frederick MRN: [id number] Date of Birth: August 26, 1934   PHYSICAL THERAPY DISCHARGE SUMMARY  Visits from Start of Care: 2  Current functional level related to goals / functional outcomes: See  above. He canceled all appointments stating treatment was not helping. At this last visit he stated his walking was improved though he noticed some back pain We discussed this at length that the stretches will improve flexibility to allow for incr walking but that back pian may limit and he should stop or decr stretch if pain incr or lingered in back to protect his surgery./    Remaining deficits: See above   Education / Equipment: stretching  Plan: Patient agrees to discharge.  Patient goals were not met. Patient is being discharged due to the patient's request.  ?????    Danny Frederick   PT  04/28/16  11:15 AM

## 2016-04-23 ENCOUNTER — Other Ambulatory Visit (INDEPENDENT_AMBULATORY_CARE_PROVIDER_SITE_OTHER): Payer: Medicare Other

## 2016-04-23 DIAGNOSIS — Z23 Encounter for immunization: Secondary | ICD-10-CM | POA: Diagnosis not present

## 2016-04-24 ENCOUNTER — Encounter: Payer: Self-pay | Admitting: Physical Therapy

## 2016-04-30 DIAGNOSIS — J3089 Other allergic rhinitis: Secondary | ICD-10-CM | POA: Diagnosis not present

## 2016-04-30 DIAGNOSIS — J301 Allergic rhinitis due to pollen: Secondary | ICD-10-CM | POA: Diagnosis not present

## 2016-05-01 ENCOUNTER — Encounter: Payer: Self-pay | Admitting: Family Medicine

## 2016-05-01 MED ORDER — ATORVASTATIN CALCIUM 10 MG PO TABS: 10.0000 mg | ORAL_TABLET | Freq: Every day | ORAL | 1 refills | Status: DC

## 2016-05-11 NOTE — Progress Notes (Signed)
Chief Complaint  Patient presents with  . Follow-up    1 month follow up bp.   Complains of a slight numbness, pressure vs fluid-feeling around the left side of his head/ear, pressure on his ear for the last few weeks.  This is sporadic, present most times but varies in degree of severity.  Denies headaches, just an odd pressure on the left ear.  Denies ringing in ear, pain in ear, vertigo.  Denies change in hearing (wears hearing aids). He has constant nasal congestion/allergies.  Sometimes it feels like it needs to pop but can't. He is getting weekly allergy shots, nasal steroid sprays.  Has f/u with allergist soon.  Hypertension:  His BP was elevated at last visit, felt to be contributed somewhat by decreased exercise.  He sent an email shortly after with list of BP's: BP fluctuated from 115/71 (with frequent [id number] systolic) up to [id number] (commented on feeling light headed with BP of 171/101 once). Pulse 49-62.  Those were back down to where they had been running (with fluctuations, sometimes high, but overall ok). He was advised to continue same meds and continue to monitor. BP's since mid-August have been fluctuating from 133-167/79-98; averaging low-mid 140's/upper 80's.  He has been exercising more (see below).  Hyperlipidemia--he contacted [place] recently asking whether he needed to continue his atorvastatin (which was started in March), and that he was starting to have muscle aches/cramps.  He was told to continue the statin, and start taking coenzyme Q10.  He never started the coenzyme Q10, because cramping was infrequent. Overall he is tolerating the atorvastatin well, just occasional cramp.  Hip pain--he had 2 sessions of physical therapy.  He then canceled the rest--it was causing some back pain, felt it was aggravating his back problem (s/p surgeries in past). He continues to do some exercises at home, and the hip is "very slightly better".  He is at the gym 2-3 times/week at the gym, and  walking on the other days.  He is able to walk 30 minutes without discomfort.  Develops recurrent hip pain after 30 minutes.  He used to walk for an hour (prior to hip pain).  H/o depression--on and off citalopram (most recently off for a few months).  At his last visit we discussed that he had been arguing with wife, agitable, struggling with the aging process, what is "normal". Counseling had been recommended.   He reports that he is doing better.  States he quit napping during the day, energy and moods are better. "I'm definitely better than when I was taking that stuff" referring to citalopram. He feels like the arguing has calmed down. "82, bored out of our minds". Not inclined to want to volunteer, thinks that is what therapists will say. Hobbies include computer, finds it satsifying. Feels like he is doing better. Admits that he is happiest when they are in [place], but would have to give up a lot to live there (much more expensive).  PMH, PSH, SH reviewed  Outpatient Encounter Prescriptions as of 05/12/2016  Medication Sig  . aspirin 81 MG tablet Take 81 mg by mouth at bedtime.   Marland Kitchen atorvastatin (LIPITOR) 10 MG tablet Take 1 tablet (10 mg total) by mouth daily.  . Ferrous Sulfate (IRON) 325 (65 FE) MG TABS Take 1 tablet by mouth every other day. Reported on 11/08/2015  . ipratropium (ATROVENT) 0.03 % nasal spray Place 2 sprays into both nostrils daily.   Marland Kitchen lisinopril-hydrochlorothiazide (PRINZIDE,ZESTORETIC) 20-12.5 MG tablet Take 1  tablet by mouth  daily  . metoprolol (LOPRESSOR) 50 MG tablet Take 1 tablet (50 mg total) by mouth 2 (two) times daily.  Marland Kitchen omeprazole (PRILOSEC) 20 MG capsule Take 1 capsule by mouth  every other day  . SYNTHROID 50 MCG tablet Take 1 tablet by mouth  daily before breakfast  . EPIPEN 2-PAK 0.3 MG/0.3ML SOAJ injection Reported on 11/08/2015  . triamcinolone cream (KENALOG) 0.1 % Apply 1 application topically 2 (two) times daily.   No facility-administered  encounter medications on file as of 05/12/2016.    No Known Allergies  ROS: no fever, chills.  +ear pressure as per HPI.  + congestion/allergies. No chest pain, palpitations, shortness of breath, cough, nausea, vomiting, bowel changes, headaches, dizziness, bleeding, bruising, rash, depression.  See HPI.  PHYSICAL EXAM: BP (!) 150/98 (BP Location: Left Arm, Patient Position: Sitting, Cuff Size: Normal)   Pulse 68   Ht 5\' 4"  (1.626 m)   Wt 146 lb (66.2 kg)   BMI 25.06 kg/m  142/92 on repeat by MD  Well developed, pleasant male in no distress.  In good spirits. HEENT: PERRL, EOMI, conjunctiva and sclera are clear.  TM's and EAC's are normal. Nasal mucosa is moderately edematous, no purulence. OP is notable for some white postnasal drainage Neck: no lymphadenopathy or mass Heart: regular rate and rhythm Lungs: clear bilaterally Extremities: no edema, normal pulses Neuro: alert and oriented, cranial nerves intact, normal strength, gait Psych: normal mood, affect, hygiene and grooming Skin: normal turgor, no rash/lesions.  ASSESSMENT/PLAN:  Essential hypertension, benign - wide variations, overall borderline. Continue low sodium diet, regular exercise, current meds, monitoring  Depression, major, in remission ([place]) - discussed his patterns, symptoms, how to combat boredom. counseled.  Consider counseling if moods worsen  Hip pain, unspecified laterality - continue home exercises; consider r/s PT if not improving  Eustachian tube dysfunction, left - symptoms suggest this, and h/o congestion would be consistent. Reassured no evidence of fluid, infection   Discussed the benefits of counseling. He would be willing to consider if moods worsen, rather than ever restarting anti-depressants.   Discussed home exercises, continuing walking. Reconsider f/u with PT for additional therapy if not showing continued improvement.  To discuss the back discomfort with the therapist if it  recurs.   Blood pressure is borderline, with wide fluctuations, but overall okay. Continue your same regimen. Continue to try and get exercise daily. Consider rescheduling physical therapy if you don't see continued improvement in your hip pain (as this contributes to less exercise and higher blood pressures).  Continue to stay active, keep busy. If moods worsen, please consider therapy/counseling.  I suspect your ear discomfort may be related to allergies/congestion (called eustachian tube dysfunction). You can mention this to your allergist.  No evidence of wax, fluid or infection on today's exam.    F/u as scheduled in February, sooner prn

## 2016-05-12 ENCOUNTER — Encounter: Payer: Self-pay | Admitting: Family Medicine

## 2016-05-12 ENCOUNTER — Ambulatory Visit (INDEPENDENT_AMBULATORY_CARE_PROVIDER_SITE_OTHER): Payer: Medicare Other | Admitting: Family Medicine

## 2016-05-12 VITALS — BP 142/92 | HR 68 | Ht 64.0 in | Wt 146.0 lb

## 2016-05-12 DIAGNOSIS — F325 Major depressive disorder, single episode, in full remission: Secondary | ICD-10-CM

## 2016-05-12 DIAGNOSIS — M25559 Pain in unspecified hip: Secondary | ICD-10-CM | POA: Diagnosis not present

## 2016-05-12 DIAGNOSIS — I10 Essential (primary) hypertension: Secondary | ICD-10-CM

## 2016-05-12 DIAGNOSIS — H6982 Other specified disorders of Eustachian tube, left ear: Secondary | ICD-10-CM | POA: Diagnosis not present

## 2016-05-12 NOTE — Patient Instructions (Signed)
  Blood pressure is borderline, with wide fluctuations, but overall okay. Continue your same regimen. Continue to try and get exercise daily. Consider rescheduling physical therapy if you don't see continued improvement in your hip pain (as this contributes to less exercise and higher blood pressures).  Continue to stay active, keep busy. If moods worsen, please consider therapy/counseling.  I suspect your ear discomfort may be related to allergies/congestion (called eustachian tube dysfunction). You can mention this to your allergist.  No evidence of wax, fluid or infection on today's exam.

## 2016-05-13 ENCOUNTER — Encounter: Payer: Self-pay | Admitting: Physical Therapy

## 2016-05-14 DIAGNOSIS — J301 Allergic rhinitis due to pollen: Secondary | ICD-10-CM | POA: Diagnosis not present

## 2016-05-14 DIAGNOSIS — J3089 Other allergic rhinitis: Secondary | ICD-10-CM | POA: Diagnosis not present

## 2016-05-15 ENCOUNTER — Encounter: Payer: Self-pay | Admitting: Physical Therapy

## 2016-05-15 ENCOUNTER — Other Ambulatory Visit: Payer: Self-pay | Admitting: Family Medicine

## 2016-05-21 DIAGNOSIS — J3089 Other allergic rhinitis: Secondary | ICD-10-CM | POA: Diagnosis not present

## 2016-05-21 DIAGNOSIS — J301 Allergic rhinitis due to pollen: Secondary | ICD-10-CM | POA: Diagnosis not present

## 2016-05-27 DIAGNOSIS — H25813 Combined forms of age-related cataract, bilateral: Secondary | ICD-10-CM | POA: Diagnosis not present

## 2016-05-27 DIAGNOSIS — H524 Presbyopia: Secondary | ICD-10-CM | POA: Diagnosis not present

## 2016-05-27 DIAGNOSIS — H52223 Regular astigmatism, bilateral: Secondary | ICD-10-CM | POA: Diagnosis not present

## 2016-05-27 DIAGNOSIS — H5203 Hypermetropia, bilateral: Secondary | ICD-10-CM | POA: Diagnosis not present

## 2016-06-03 NOTE — Progress Notes (Signed)
HPI: FU TAA repair; also with micturition syncope, HTN. Admitted 12/2013 with a contained, ruptured ascending thoracic aortic aneurysm in the setting of streptococcal pneumoniae bacteremia/aortitis. He underwent repair of the ascending thoracic aortic aneurysm with a 32 mm Hemashield straight graft. He was also followed by infectious disease and treated with antibiotics. The patient was noted to have clot in the main pulmonary artery. This was likely related to compression of the pulmonary artery by the false aneurysm. He was started on Coumadin. Echocardiogram 7/15 showed normal LV function, mild MR and mild RVE. Last CTA July 2017 showed no evidence of complications with prior aneurysm repair, pulmonary fibrosis. Since last seen, the patient denies any dyspnea on exertion, orthopnea, PND, pedal edema, palpitations, syncope or chest pain.   Current Outpatient Prescriptions  Medication Sig Dispense Refill  . aspirin 81 MG tablet Take 81 mg by mouth at bedtime.     Marland Kitchen atorvastatin (LIPITOR) 10 MG tablet Take 1 tablet (10 mg total) by mouth daily. 90 tablet 1  . EPIPEN 2-PAK 0.3 MG/0.3ML SOAJ injection Reported on 11/08/2015  1  . Ferrous Sulfate (IRON) 325 (65 FE) MG TABS Take 1 tablet by mouth every other day. Reported on 11/08/2015    . ipratropium (ATROVENT) 0.03 % nasal spray Place 2 sprays into both nostrils daily.     Marland Kitchen lisinopril-hydrochlorothiazide (PRINZIDE,ZESTORETIC) 20-12.5 MG tablet Take 1 tablet by mouth  daily 90 tablet 0  . metoprolol (LOPRESSOR) 50 MG tablet Take 1 tablet (50 mg total) by mouth 2 (two) times daily. 180 tablet 1  . omeprazole (PRILOSEC) 20 MG capsule Take 1 capsule by mouth  every other day 45 capsule 3  . SYNTHROID 50 MCG tablet Take 1 tablet by mouth  daily before breakfast 90 tablet 0  . triamcinolone cream (KENALOG) 0.1 % Apply 1 application topically 2 (two) times daily. 454 g 0   No current facility-administered medications for this visit.      Past  Medical History:  Diagnosis Date  . Allergic conjunctivitis   . Allergic rhinitis, cause unspecified    on allergy shots (Dr. Orvil Feil)  . Atherosclerosis of aorta (HCC)    noted on CT angio of abd/pelvis, in many vessels  . BPH (benign prostatic hypertrophy)   . Diverticulosis of colon 12/09  . Foot drop, right   . GERD (gastroesophageal reflux disease)   . Hearing loss in left ear    both ears now  . History of Clostridium difficile    multiple times 2016--s/p fecal transplant (Dr. Baxter Flattery).  Marland Kitchen Hx of echocardiogram    Echo (03/2014):  Mild LVH, EF 50-55%, no RWMA, Gr 1 DD, mild MR, mild reduced RVSF  . Hypertension   . Internal hemorrhoids   . Iron deficiency anemia, unspecified 6/09  . Mitral valve prolapse    h/o; normal echo 12/2011 with no MVP seen  . 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 Right ventricular dysfunction 01/23/2014   Secondary to obstruction of main pulmonary artery  . Ruptured thoracic aortic aneurysm ([place]) 01/23/2014  . Unspecified hypothyroidism     Past Surgical History:  Procedure Laterality Date  . CERVICAL LAMINECTOMY  1972   C5-6  . COLONOSCOPY WITH PROPOFOL N/A 11/09/2014   Procedure: COLONOSCOPY WITH PROPOFOL;  Surgeon: Jerene Bears, MD;  Location: [facility];  Service: Gastroenterology;  Laterality: N/A;  . ESOPHAGOGASTRODUODENOSCOPY  10/31/08   normal; Dr. Edison Nasuti  . FECAL TRANSPLANT N/A 11/09/2014   Procedure: FECAL TRANSPLANT;  Surgeon: Jerene Bears, MD;  Location: [facility];  Service: Gastroenterology;  Laterality: N/A;  . HEMORRHOIDECTOMY WITH HEMORRHOID BANDING  04/07/13  . hemorroidal banding  04/07/2013   x3-Dr.Eric Redmond Pulling  . PROSTATE SURGERY  2007   photovaporization  . ROTATOR CUFF REPAIR  10/2008   left; Dr. Onnie Graham  . SPINE SURGERY  2006   L4-5 disk surgery  . SPINE SURGERY  04/2013   L4-5, L5-S1 fusion.  Dr. Christella Noa  . THORACIC AORTIC ANEURYSM REPAIR N/A 01/23/2014   Procedure: THORACIC ASCENDING ANEURYSM REPAIR (AAA);   Surgeon: Rexene Alberts, MD;  Location: [facility];  Service: Open Heart Surgery;  Laterality: N/A;  . TONSILLECTOMY      Social History   Social History  . Marital status: Married    Spouse name: N/A  . Number of children: 2  . Years of education: N/A   Occupational History  . Not on file.   Social History Main Topics  . Smoking status: Former Smoker    Types: Cigarettes    Quit date: 09/01/1977  . Smokeless tobacco: Never Used  . Alcohol use Yes     Comment: 1-2 glasses wine per day  . Drug use: No  . Sexual activity: Not on file   Other Topics Concern  . Not on file   Social History Narrative   Lives with wife.  (no longer has pet rats).  1 daughter in [place], 1 daughter in [place], [place]    Family History  Problem Relation Age of Onset  . Diabetes Mother   . Heart disease Mother     CHF  . Hypertension Mother   . Depression Mother   . Stroke Father   . Parkinsonism Father   . Hypertension Father   . Cancer Brother     bladder cancer  . Cancer Brother     prostate cancer  . Emphysema Brother   . Diabetes Sister   . Colon cancer Neg Hx     ROS: no fevers or chills, productive cough, hemoptysis, dysphasia, odynophagia, melena, hematochezia, dysuria, hematuria, rash, seizure activity, orthopnea, PND, pedal edema, claudication. Remaining systems are negative.  Physical Exam: Well-developed well-nourished in no acute distress.  Skin is warm and dry.  HEENT is normal.  Neck is supple.  Chest is clear to auscultation with normal expansion.  Cardiovascular exam is regular rate and rhythm.  Abdominal exam nontender or distended. No masses palpated. Extremities show no edema. neuro grossly intact  ECG-Sinus rhythm at a rate of 61. Probable limb lead reversal. Left ventricular hypertrophy. Cannot rule out prior septal infarct. Inferior lateral T-wave inversion.  A/P  1 Hypertension-blood pressure elevated. Increase lisinopril to 40 mg daily. Continue  remaining medications. Check potassium and renal function in 1 week. Follow-up in hypertension clinic 2-4 weeks for further adjustment as needed. He will continue to track his blood pressure at home and he will bring his cuff at next office visit for correlation with ours.  2 status post repair of thoracic aortic aneurysm- followed by Dr. Roxy Manns.  3 pulmonary artery thrombus-patient was treated with Coumadin and follow-up CT did not show recurrence.  4 hyperlipidemia-continue statin.   Kirk Ruths, MD

## 2016-06-04 DIAGNOSIS — J301 Allergic rhinitis due to pollen: Secondary | ICD-10-CM | POA: Diagnosis not present

## 2016-06-04 DIAGNOSIS — J3089 Other allergic rhinitis: Secondary | ICD-10-CM | POA: Diagnosis not present

## 2016-06-06 ENCOUNTER — Encounter: Payer: Self-pay | Admitting: Cardiology

## 2016-06-06 ENCOUNTER — Ambulatory Visit (INDEPENDENT_AMBULATORY_CARE_PROVIDER_SITE_OTHER): Payer: Medicare Other | Admitting: Cardiology

## 2016-06-06 VITALS — BP 162/100 | HR 61 | Ht 64.0 in | Wt 143.0 lb

## 2016-06-06 DIAGNOSIS — I10 Essential (primary) hypertension: Secondary | ICD-10-CM | POA: Diagnosis not present

## 2016-06-06 DIAGNOSIS — E784 Other hyperlipidemia: Secondary | ICD-10-CM | POA: Diagnosis not present

## 2016-06-06 DIAGNOSIS — E7849 Other hyperlipidemia: Secondary | ICD-10-CM

## 2016-06-06 MED ORDER — LISINOPRIL 20 MG PO TABS: 20.0000 mg | ORAL_TABLET | Freq: Every day | ORAL | 3 refills | Status: DC

## 2016-06-06 NOTE — Patient Instructions (Signed)
Medication Instructions:   INCREASE LISINOPRIL HCTZ TO 40/12.5 MG ONCE DAILY= 1 OF THE 20/12.5 MG TABLETS AND 1 OF THE 20 MG LISINOPRIL TABLETS ONCE DAILY  Labwork:  Your physician recommends that you return for lab work in: [place]:  Your physician recommends that you schedule a follow-up appointment in: 2-4 [place]  Your physician wants you to follow-up in: [place] will receive a reminder letter in the mail two months in advance. If you don't receive a letter, please call our office to schedule the follow-up appointment.    If you need a refill on your cardiac medications before your next appointment, please call your pharmacy.

## 2016-06-10 DIAGNOSIS — H903 Sensorineural hearing loss, bilateral: Secondary | ICD-10-CM | POA: Diagnosis not present

## 2016-06-13 DIAGNOSIS — I10 Essential (primary) hypertension: Secondary | ICD-10-CM | POA: Diagnosis not present

## 2016-06-13 LAB — BASIC METABOLIC PANEL
BUN: 16 mg/dL (ref 7–25)
CALCIUM: 9.4 mg/dL (ref 8.6–10.3)
CO2: 26 mmol/L (ref 20–31)
Chloride: 98 mmol/L (ref 98–110)
Creat: 1.13 mg/dL — ABNORMAL HIGH (ref 0.70–1.11)
GLUCOSE: 108 mg/dL — AB (ref 65–99)
Potassium: 4.2 mmol/L (ref 3.5–5.3)
SODIUM: 135 mmol/L (ref 135–146)

## 2016-07-02 DIAGNOSIS — J3089 Other allergic rhinitis: Secondary | ICD-10-CM | POA: Diagnosis not present

## 2016-07-02 DIAGNOSIS — J301 Allergic rhinitis due to pollen: Secondary | ICD-10-CM | POA: Diagnosis not present

## 2016-07-03 ENCOUNTER — Ambulatory Visit (INDEPENDENT_AMBULATORY_CARE_PROVIDER_SITE_OTHER): Payer: Medicare Other | Admitting: Pharmacist

## 2016-07-03 ENCOUNTER — Encounter: Payer: Self-pay | Admitting: Pharmacist

## 2016-07-03 VITALS — BP 152/88 | HR 61

## 2016-07-03 DIAGNOSIS — I10 Essential (primary) hypertension: Secondary | ICD-10-CM | POA: Diagnosis not present

## 2016-07-03 MED ORDER — VALSARTAN-HYDROCHLOROTHIAZIDE 160-12.5 MG PO TABS: 1.0000 | ORAL_TABLET | Freq: Every day | ORAL | 1 refills | Status: DC

## 2016-07-03 NOTE — Progress Notes (Signed)
Patient ID: Danny Frederick                 DOB: [birth year]                      MRN: [id number]     HPI: Danny Frederick is a 82 y.o. male patient of Dr. Stanford Breed with PMH below who presents today for hypertension evaluation. His lisinopril was recently increased to 40mg  with hctz 12.5mg .   He states he has a long history of hypertension, but has only tried one other medication, amlodipine, which was changed to lisinopril a long time ago. He reports no improvement with pressures since increasing the lisinopril component.   He states he wonders if it might be better to try something different rather than continue on lisinopril. He is a firm believer that every medication will cause side effects so he is concerned with taking too much medication.   He also states that Dr. Stanford Breed would like his pressures closer to 120s/80s.    Cardiac Hx: Htn, Aortic aneurysm, hypothyroidism  Current HTN meds:  Lisinopril 40mg /12.5mg  daily Metoprolol 50mg  BID   Previously tried:  amlodipine  BP goal: <150/90  Family History: He believes htn runs in his family, but he can't be sure.   Social History: He quit smoking in 1979. He endorses drinking a couple glasses of wine per day.   Diet: He eats most of his meals from home and rarely will add salt. He drinks tea mostly.   Exercise: He works out at [employer] 2 days per week and walks most days.   Home BP readings:  Pressures reported before increase were 140s/70s  Per log since increase - 129-176/77-95 with most in 150s/80s - HR in low 60s.   His cuff measures - 153/90 - which is verified with manual reading today.   Wt Readings from Last 3 Encounters:  06/06/16 143 lb (64.9 kg)  05/12/16 146 lb (66.2 kg)  04/02/16 144 lb 6.4 oz (65.5 kg)   BP Readings from Last 3 Encounters:  07/03/16 (!) 152/88  06/06/16 (!) 162/100  05/12/16 (!) 142/92   Pulse Readings from Last 3 Encounters:  07/03/16 61  06/06/16 61  05/12/16 68    Renal  function: CrCl cannot be calculated (Unknown ideal weight.).  Past Medical History:  Diagnosis Date  . Allergic conjunctivitis   . Allergic rhinitis, cause unspecified    on allergy shots (Dr. Orvil Feil)  . Atherosclerosis of aorta (HCC)    noted on CT angio of abd/pelvis, in many vessels  . BPH (benign prostatic hypertrophy)   . Diverticulosis of colon 12/09  . Foot drop, right   . GERD (gastroesophageal reflux disease)   . Hearing loss in left ear    both ears now  . History of Clostridium difficile    multiple times 2016--s/p fecal transplant (Dr. Baxter Flattery).  Marland Kitchen Hx of echocardiogram    Echo (03/2014):  Mild LVH, EF 50-55%, no RWMA, Gr 1 DD, mild MR, mild reduced RVSF  . Hypertension   . Internal hemorrhoids   . Iron deficiency anemia, unspecified 6/09  . Mitral valve prolapse    h/o; normal echo 12/2011 with no MVP seen  . 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 Right ventricular dysfunction 01/23/2014   Secondary to obstruction of main pulmonary artery  . Ruptured thoracic aortic aneurysm ([place]) 01/23/2014  . Unspecified hypothyroidism     Current Outpatient Prescriptions on File Prior to Visit  Medication Sig Dispense Refill  . aspirin 81 MG tablet Take 81 mg by mouth at bedtime.     Marland Kitchen atorvastatin (LIPITOR) 10 MG tablet Take 1 tablet (10 mg total) by mouth daily. 90 tablet 1  . Ferrous Sulfate (IRON) 325 (65 FE) MG TABS Take 1 tablet by mouth every other day. Reported on 11/08/2015    . metoprolol (LOPRESSOR) 50 MG tablet Take 1 tablet (50 mg total) by mouth 2 (two) times daily. 180 tablet 1  . omeprazole (PRILOSEC) 20 MG capsule Take 1 capsule by mouth  every other day 45 capsule 3  . SYNTHROID 50 MCG tablet Take 1 tablet by mouth  daily before breakfast 90 tablet 0  . triamcinolone cream (KENALOG) 0.1 % Apply 1 application topically 2 (two) times daily. 454 g 0  . EPIPEN 2-PAK 0.3 MG/0.3ML SOAJ injection Reported on 11/08/2015  1  . ipratropium (ATROVENT) 0.03 % nasal spray  Place 2 sprays into both nostrils daily.      No current facility-administered medications on file prior to visit.     No Known Allergies  Blood pressure (!) 152/88, pulse 61.   Assessment/Plan: Hypertension: BP not at goal today and unchanged after medication titration. Will change lisinopril to valsartan which tends to be a little more potent on pressures. Will start with valsartan/HCT 160/12.5mg  daily. Continue to monitor pressures daily and follow up in 4 weeks for BP check and BMET.   Thank you, Lelan Pons. Patterson Hammersmith, [facility]  07/03/2016 3:20 PM

## 2016-07-03 NOTE — Patient Instructions (Addendum)
Return for a a follow up appointment in 4-6 weeks  Check your blood pressure at home daily (if able) and keep record of the readings.  Take your BP meds as follows: STOP Lisinopril/HCTZ and lisinopril  START valsartan/HCTZ 160/12.5mg  once daily  Bring all of your meds, your BP cuff and your record of home blood pressures to your next appointment.  Exercise as you're able, try to walk approximately 30 minutes per day.  Keep salt intake to a minimum, especially watch canned and prepared boxed foods.  Eat more fresh fruits and vegetables and fewer canned items.  Avoid eating in fast food restaurants.    HOW TO TAKE YOUR BLOOD PRESSURE: . Rest 5 minutes before taking your blood pressure. .  Don't smoke or drink caffeinated beverages for at least 30 minutes before. . Take your blood pressure before (not after) you eat. . Sit comfortably with your back supported and both feet on the floor (don't cross your legs). . Elevate your arm to heart level on a table or a desk. . Use the proper sized cuff. It should fit smoothly and snugly around your bare upper arm. There should be enough room to slip a fingertip under the cuff. The bottom edge of the cuff should be 1 inch above the crease of the elbow. . Ideally, take 3 measurements at one sitting and record the average.

## 2016-07-04 ENCOUNTER — Other Ambulatory Visit: Payer: Self-pay | Admitting: Family Medicine

## 2016-07-08 ENCOUNTER — Other Ambulatory Visit: Payer: Self-pay

## 2016-07-08 MED ORDER — VALSARTAN-HYDROCHLOROTHIAZIDE 160-12.5 MG PO TABS: 1.0000 | ORAL_TABLET | Freq: Every day | ORAL | 3 refills | Status: DC

## 2016-07-09 ENCOUNTER — Telehealth: Payer: Self-pay | Admitting: Cardiology

## 2016-07-09 NOTE — Telephone Encounter (Signed)
Returned call to [facility], current meds clarified. They sought clarification on whether the patient was still prescribed lisinopril or lisinopril HCTZ and I informed them this was not the case. No further questions at this time.

## 2016-07-09 NOTE — Telephone Encounter (Signed)
Kim from [employer] is calling to get clarification on a medication ( Valsartan HCTZ) reference to [id number].Marland Kitchen Please call

## 2016-07-10 DIAGNOSIS — H1045 Other chronic allergic conjunctivitis: Secondary | ICD-10-CM | POA: Diagnosis not present

## 2016-07-10 DIAGNOSIS — J3089 Other allergic rhinitis: Secondary | ICD-10-CM | POA: Diagnosis not present

## 2016-07-10 DIAGNOSIS — J301 Allergic rhinitis due to pollen: Secondary | ICD-10-CM | POA: Diagnosis not present

## 2016-07-10 DIAGNOSIS — J3081 Allergic rhinitis due to animal (cat) (dog) hair and dander: Secondary | ICD-10-CM | POA: Diagnosis not present

## 2016-07-16 DIAGNOSIS — J3089 Other allergic rhinitis: Secondary | ICD-10-CM | POA: Diagnosis not present

## 2016-07-16 DIAGNOSIS — J301 Allergic rhinitis due to pollen: Secondary | ICD-10-CM | POA: Diagnosis not present

## 2016-07-30 DIAGNOSIS — J301 Allergic rhinitis due to pollen: Secondary | ICD-10-CM | POA: Diagnosis not present

## 2016-07-30 DIAGNOSIS — J3089 Other allergic rhinitis: Secondary | ICD-10-CM | POA: Diagnosis not present

## 2016-08-01 ENCOUNTER — Other Ambulatory Visit: Payer: Self-pay | Admitting: Family Medicine

## 2016-08-02 ENCOUNTER — Other Ambulatory Visit: Payer: Self-pay | Admitting: Family Medicine

## 2016-08-04 ENCOUNTER — Encounter: Payer: Self-pay | Admitting: Cardiology

## 2016-08-07 ENCOUNTER — Other Ambulatory Visit: Payer: Self-pay | Admitting: Cardiology

## 2016-08-07 ENCOUNTER — Ambulatory Visit (INDEPENDENT_AMBULATORY_CARE_PROVIDER_SITE_OTHER): Payer: Medicare Other | Admitting: Pharmacist

## 2016-08-07 VITALS — BP 168/94 | HR 61

## 2016-08-07 DIAGNOSIS — I10 Essential (primary) hypertension: Secondary | ICD-10-CM | POA: Diagnosis not present

## 2016-08-07 LAB — BASIC METABOLIC PANEL
BUN: 20 mg/dL (ref 7–25)
CALCIUM: 9.6 mg/dL (ref 8.6–10.3)
CHLORIDE: 100 mmol/L (ref 98–110)
CO2: 30 mmol/L (ref 20–31)
CREATININE: 1.28 mg/dL — AB (ref 0.70–1.11)
Glucose, Bld: 80 mg/dL (ref 65–99)
Potassium: 4.2 mmol/L (ref 3.5–5.3)
Sodium: 137 mmol/L (ref 135–146)

## 2016-08-07 NOTE — Patient Instructions (Signed)
Return for a follow up appointment in 4 weeks  Check your blood pressure at home daily (if able) and keep record of the readings.  Take your BP meds as follows: START taking valsartan 160/12.5mg  twice daily  Bring all of your meds, your BP cuff and your record of home blood pressures to your next appointment.  Exercise as you're able, try to walk approximately 30 minutes per day.  Keep salt intake to a minimum, especially watch canned and prepared boxed foods.  Eat more fresh fruits and vegetables and fewer canned items.  Avoid eating in fast food restaurants.    HOW TO TAKE YOUR BLOOD PRESSURE: . Rest 5 minutes before taking your blood pressure. .  Don't smoke or drink caffeinated beverages for at least 30 minutes before. . Take your blood pressure before (not after) you eat. . Sit comfortably with your back supported and both feet on the floor (don't cross your legs). . Elevate your arm to heart level on a table or a desk. . Use the proper sized cuff. It should fit smoothly and snugly around your bare upper arm. There should be enough room to slip a fingertip under the cuff. The bottom edge of the cuff should be 1 inch above the crease of the elbow. . Ideally, take 3 measurements at one sitting and record the average.

## 2016-08-07 NOTE — Progress Notes (Addendum)
Patient ID: Danny Frederick                 DOB: 1934-05-25                      MRN: [id number]     HPI: Danny Frederick is a 82 y.o. male patient of Dr. Stanford Breed with PMH below who presents today for hypertension follow up.  He states he has a long history of hypertension (25 years), but has only tried one other medication, amlodipine, which was changed to lisinopril a long time ago. He reports no medication has ever had a significant impact on his blood pressure. At my last visit with him he was changed over to valsartan/HCTZ from lisinopril as max dose lisinopril was not controlling his pressure.   He reports that he has had some dizziness. He states it lasts only for a few seconds and he does not associate it with standing or changes in position. He states it is random.    Cardiac Hx: Htn, Aortic aneurysm, hypothyroidism  Current HTN meds:  Valsartan/HCTZ 160/12.5mg  daily Metoprolol 50mg  BID  Previously tried:  amlodipine  BP goal: <130/80 - pt reports Dr. Stanford Breed prefers closer to 120/80  Family History: He believes htn runs in his family, but he can't be sure.   Social History: He quit smoking in 1979. He endorses drinking a couple glasses of wine per day.   Diet: He eats most of his meals from home and rarely will add salt. He drinks tea mostly.   Exercise: He works out at [employer] 2 days per week and walks most days.   Home BP readings:  One or two measurements 120s/80s, otherwise mostly 140-150s/high 80s-90s. HR wnl.  The evening pressures tend to be greater than morning pressures.   Wt Readings from Last 3 Encounters:  06/06/16 143 lb (64.9 kg)  05/12/16 146 lb (66.2 kg)  04/02/16 144 lb 6.4 oz (65.5 kg)   BP Readings from Last 3 Encounters:  08/07/16 (!) 168/94  07/03/16 (!) 152/88  06/06/16 (!) 162/100   Pulse Readings from Last 3 Encounters:  08/07/16 61  07/03/16 61  06/06/16 61    Renal function: CrCl cannot be calculated (Patient's most  recent lab result is older than the maximum 21 days allowed.).  Past Medical History:  Diagnosis Date  . Allergic conjunctivitis   . Allergic rhinitis, cause unspecified    on allergy shots (Dr. Orvil Feil)  . Atherosclerosis of aorta (HCC)    noted on CT angio of abd/pelvis, in many vessels  . BPH (benign prostatic hypertrophy)   . Diverticulosis of colon 12/09  . Foot drop, right   . GERD (gastroesophageal reflux disease)   . Hearing loss in left ear    both ears now  . History of Clostridium difficile    multiple times 2016--s/p fecal transplant (Dr. Baxter Flattery).  Marland Kitchen Hx of echocardiogram    Echo (03/2014):  Mild LVH, EF 50-55%, no RWMA, Gr 1 DD, mild MR, mild reduced RVSF  . Hypertension   . Internal hemorrhoids   . Iron deficiency anemia, unspecified 6/09  . Mitral valve prolapse    h/o; normal echo 12/2011 with no MVP seen  . 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 Right ventricular dysfunction 01/23/2014   Secondary to obstruction of main pulmonary artery  . Ruptured thoracic aortic aneurysm ([place]) 01/23/2014  . Unspecified hypothyroidism     Current Outpatient Prescriptions on File Prior to Visit  Medication Sig Dispense Refill  . aspirin 81 MG tablet Take 81 mg by mouth at bedtime.     Marland Kitchen atorvastatin (LIPITOR) 10 MG tablet Take 1 tablet (10 mg total) by mouth daily. 90 tablet 1  . EPIPEN 2-PAK 0.3 MG/0.3ML SOAJ injection Reported on 11/08/2015  1  . Ferrous Sulfate (IRON) 325 (65 FE) MG TABS Take 1 tablet by mouth every other day. Reported on 11/08/2015    . ipratropium (ATROVENT) 0.03 % nasal spray Place 2 sprays into both nostrils daily.     . metoprolol (LOPRESSOR) 50 MG tablet Take 1 tablet (50 mg total) by mouth 2 (two) times daily. 180 tablet 1  . omeprazole (PRILOSEC) 20 MG capsule TAKE 1 CAPSULE BY MOUTH  EVERY OTHER DAY 45 capsule 0  . SYNTHROID 50 MCG tablet TAKE 1 TABLET BY MOUTH  DAILY BEFORE BREAKFAST 90 tablet 0  . triamcinolone cream (KENALOG) 0.1 % Apply 1  application topically 2 (two) times daily. 454 g 0  . valsartan-hydrochlorothiazide (DIOVAN HCT) 160-12.5 MG tablet Take 1 tablet by mouth daily. 90 tablet 3   No current facility-administered medications on file prior to visit.     No Known Allergies  Blood pressure (!) 168/94, pulse 61, SpO2 99 %.   Assessment/Plan: Hypertension: BP not at goal and remains persistently elevated. BMET today after change to valsartan/HCTZ. Plan to increase Valsartan/HCTZ 160/12.5mg  to BID since his pressures tend to run higher in the evening. Pt instructed to call if dizziness worsens or increases in frequency. Follow up in hypertension clinic in 4 weeks.    Thank you, Lelan Pons. Patterson Hammersmith, [facility]  08/07/2016 2:46 PM    Patient contacted Hypertension Clinic by phone today 08/11/16.  Reports adverse reaction to Diovan dose increase.  Feeling "fainting- like , hot and sweaty" shortly after taking Diovan.  No history of diabetes noted and no other changes in medication or daily routine reported by patient. Last home BP at home was 150/91 on Saturday but no BP measured around onset of symptoms.  Instructed to:  1. Continue daily BP monitoring at home 2. Report any additional sing and symptoms if present 3. Resume Diovan 160mg  daily (previosly stable dose) 4. Keep 4 weeks follow up appointment 5. We will adjust therapy as needed during next Office Vist

## 2016-08-13 DIAGNOSIS — J3089 Other allergic rhinitis: Secondary | ICD-10-CM | POA: Diagnosis not present

## 2016-08-13 DIAGNOSIS — J301 Allergic rhinitis due to pollen: Secondary | ICD-10-CM | POA: Diagnosis not present

## 2016-08-27 DIAGNOSIS — J3089 Other allergic rhinitis: Secondary | ICD-10-CM | POA: Diagnosis not present

## 2016-08-27 DIAGNOSIS — J301 Allergic rhinitis due to pollen: Secondary | ICD-10-CM | POA: Diagnosis not present

## 2016-09-04 ENCOUNTER — Other Ambulatory Visit: Payer: Self-pay | Admitting: Family Medicine

## 2016-09-04 ENCOUNTER — Ambulatory Visit (INDEPENDENT_AMBULATORY_CARE_PROVIDER_SITE_OTHER): Payer: Medicare Other | Admitting: Pharmacist

## 2016-09-04 VITALS — BP 152/85 | HR 67

## 2016-09-04 DIAGNOSIS — I10 Essential (primary) hypertension: Secondary | ICD-10-CM

## 2016-09-04 MED ORDER — HYDRALAZINE HCL 25 MG PO TABS: 25.0000 mg | ORAL_TABLET | Freq: Two times a day (BID) | ORAL | 11 refills | Status: DC

## 2016-09-04 NOTE — Patient Instructions (Addendum)
Blood pressure today is 152/85  Take blood pressure medication as follow:   1. Valsartan/HCTZ 160/12.5mg  daily   2. Metoprolol 50mg  twice daily   3. Start hydralazine 25mg  twice daily (25mg  every evening for 5 days, then increase to twice daily if tolerating)  Keep record of blood pressure and bring to next office visit  Follow up in 4 weeks

## 2016-09-04 NOTE — Progress Notes (Signed)
Patient ID: Danny Frederick                 DOB: [birth year]                      MRN: [id number]     HPI: Danny Frederick is a 83 y.o. male patient of Dr. Jens Som with PMH below who presents today for hypertension follow up.  He states he has a long history of hypertension (25 years), but has only tried  Amlodipine and  lisinopril .  He reports no medication has ever had a significant impact on his blood pressure.  Valsartan/HCTZ dose was increased from 160/12.5mg  daily to BID on last OV but patient was unable to tolerate.  He reports that he has had some dizziness one or twice but not frequent. He states it lasts only for a few seconds and he does not associate it with standing or changes in position. Patient still works out at [employer] frequently without problems.   Cardiac Hx: Htn, Aortic aneurysm, hypothyroidism  Current HTN meds:  Valsartan/HCTZ 160/12.5mg  daily Metoprolol 50mg  BID  Previously tried: amlodipine, lisinopril  BP goal: <130/80 - pt reports Dr. Jens Som prefers closer to 120/80  Family History: He believes htn runs in his family, but he can't be sure.   Social History: He quit smoking in 1979. He endorses drinking a couple glasses of wine per day.   Diet: He eats most of his meals from home and rarely will add salt. He drinks tea mostly.   Exercise: He works out at [employer] 2 days per week and walks most days.   Home BP readings: average 149/87 (HR 64) per home monitor   Wt Readings from Last 3 Encounters:  06/06/16 143 lb (64.9 kg)  05/12/16 146 lb (66.2 kg)  04/02/16 144 lb 6.4 oz (65.5 kg)   BP Readings from Last 3 Encounters:  08/07/16 (!) 168/94  07/03/16 (!) 152/88  06/06/16 (!) 162/100   Pulse Readings from Last 3 Encounters:  08/07/16 61  07/03/16 61  06/06/16 61    Past Medical History:  Diagnosis Date  . Allergic conjunctivitis   . Allergic rhinitis, cause unspecified    on allergy shots (Dr. Barnetta Chapel)  . Atherosclerosis of aorta  (HCC)    noted on CT angio of abd/pelvis, in many vessels  . BPH (benign prostatic hypertrophy)   . Diverticulosis of colon 12/09  . Foot drop, right   . GERD (gastroesophageal reflux disease)   . Hearing loss in left ear    both ears now  . History of Clostridium difficile    multiple times 2016--s/p fecal transplant (Dr. Drue Second).  Marland Kitchen Hx of echocardiogram    Echo (03/2014):  Mild LVH, EF 50-55%, no RWMA, Gr 1 DD, mild MR, mild reduced RVSF  . Hypertension   . Internal hemorrhoids   . Iron deficiency anemia, unspecified 6/09  . Mitral valve prolapse    h/o; normal echo 12/2011 with no MVP seen  . 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 Right ventricular dysfunction 01/23/2014   Secondary to obstruction of main pulmonary artery  . Ruptured thoracic aortic aneurysm (HCC) 01/23/2014  . Unspecified hypothyroidism     Current Outpatient Prescriptions on File Prior to Visit  Medication Sig Dispense Refill  . aspirin 81 MG tablet Take 81 mg by mouth at bedtime.     Marland Kitchen atorvastatin (LIPITOR) 10 MG tablet Take 1 tablet (10 mg total) by mouth daily.  90 tablet 1  . EPIPEN 2-PAK 0.3 MG/0.3ML SOAJ injection Reported on 11/08/2015  1  . Ferrous Sulfate (IRON) 325 (65 FE) MG TABS Take 1 tablet by mouth every other day. Reported on 11/08/2015    . ipratropium (ATROVENT) 0.03 % nasal spray Place 2 sprays into both nostrils daily.     Marland Kitchen omeprazole (PRILOSEC) 20 MG capsule TAKE 1 CAPSULE BY MOUTH  EVERY OTHER DAY 45 capsule 0  . SYNTHROID 50 MCG tablet TAKE 1 TABLET BY MOUTH  DAILY BEFORE BREAKFAST 90 tablet 0  . triamcinolone cream (KENALOG) 0.1 % Apply 1 application topically 2 (two) times daily. 454 g 0  . valsartan-hydrochlorothiazide (DIOVAN HCT) 160-12.5 MG tablet Take 1 tablet by mouth 2 (two) times daily. 180 tablet 3   No current facility-administered medications on file prior to visit.     No Known Allergies  Assessment/Plan:  Hypertension: BP not at goal and remains persistently  elevated. Last BMET done 08/07/16, after initiating valsartan, show stable electrolytes level and renal fucntion. Patient unable to tolerate increase dose of valsartan/HCTZ and heart rate already in low 60s with metoprolol 50mg  BID. Will add hydralazine 25mg  BID (low dose) today to increase chance of tolerability. Patient aware of potential to increase dose up to 100mg  TID if needed and willing to try new medication.  Pt instructed to take 25mg  of hydralazine daily for 5 days, then add the 2nd daily dose and call if dizziness worsens or increases in frequency.  Home device calibrated with manual BP readings today and is within 100mm/Hg difference.  Follow up with hypertension clinic in 4 weeks.    Derrious Bologna Rodriguez-Guzman PharmD, BCPS [facility] [address] [place] [address] 09/04/2016 6:18 PM

## 2016-09-10 DIAGNOSIS — J301 Allergic rhinitis due to pollen: Secondary | ICD-10-CM | POA: Diagnosis not present

## 2016-09-10 DIAGNOSIS — J3089 Other allergic rhinitis: Secondary | ICD-10-CM | POA: Diagnosis not present

## 2016-09-15 ENCOUNTER — Other Ambulatory Visit: Payer: Self-pay | Admitting: Family Medicine

## 2016-09-24 DIAGNOSIS — J3089 Other allergic rhinitis: Secondary | ICD-10-CM | POA: Diagnosis not present

## 2016-09-24 DIAGNOSIS — J301 Allergic rhinitis due to pollen: Secondary | ICD-10-CM | POA: Diagnosis not present

## 2016-09-29 ENCOUNTER — Other Ambulatory Visit: Payer: Self-pay | Admitting: Family Medicine

## 2016-10-07 ENCOUNTER — Ambulatory Visit (INDEPENDENT_AMBULATORY_CARE_PROVIDER_SITE_OTHER): Payer: Medicare Other | Admitting: Pharmacist

## 2016-10-07 VITALS — BP 152/80 | HR 64

## 2016-10-07 DIAGNOSIS — I10 Essential (primary) hypertension: Secondary | ICD-10-CM | POA: Diagnosis not present

## 2016-10-07 MED ORDER — HYDRALAZINE HCL 25 MG PO TABS: 25.0000 mg | ORAL_TABLET | Freq: Two times a day (BID) | ORAL | 1 refills | Status: DC

## 2016-10-07 NOTE — Progress Notes (Signed)
Patient ID: Danny Frederick                 DOB: 09-06-1933                      MRN: [id number]     HPI:  Danny Frederick is a 83 y.o. male patient of Dr. Jens Som with cardiac PMH below who presents today for hypertension follow up. He states he has a long history of hypertension (25 years), but has only tried amlodipine and lisinopril.  He reports no medication has ever had a significant impact on his blood pressure.  Patient was unable to tolerate valsartan/HCTZ 160/12.5mg  BID in the past.  During the last office visit Hydralazine 25mg  twice daily was added to therapy.  He reports that he has had some dizziness and stopped taking the second daily dose of hydralazine. Patient still works out at [employer] frequently without problems.  Cardiac Hx: Htn, Aortic aneurysm, hypothyroidism  Current HTN meds: Valsartan/HCTZ 160/12.5mg  daily Metoprolol 50mg  twice daily Hydralazine 25mg  daily  Previously tried:amlodipine, lisinopril  BP goal: <130/80 - pt reports Dr. Jens Som prefers closer to 120/80  Family History: He believes htn runs in his family, but he can't be sure.   Social History: He quit smoking in 1979. He endorses drinking a couple glasses of wine per day.   Diet:He eats most of his meals from home and rarely will add salt. He drinks tea mostly.   Exercise:He works out at [employer] 2 days per week and walks most days.   Home BP readings:  10 reading; 153/86 average  Wt Readings from Last 3 Encounters:  06/06/16 143 lb (64.9 kg)  05/12/16 146 lb (66.2 kg)  04/02/16 144 lb 6.4 oz (65.5 kg)   BP Readings from Last 3 Encounters:  10/07/16 (!) 152/80  09/04/16 (!) 152/85  08/07/16 (!) 168/94   Pulse Readings from Last 3 Encounters:  10/07/16 64  09/04/16 67  08/07/16 61     Past Medical History:  Diagnosis Date  . Allergic conjunctivitis   . Allergic rhinitis, cause unspecified    on allergy shots (Dr. Barnetta Chapel)  . Atherosclerosis of aorta (HCC)    noted on  CT angio of abd/pelvis, in many vessels  . BPH (benign prostatic hypertrophy)   . Diverticulosis of colon 12/09  . Foot drop, right   . GERD (gastroesophageal reflux disease)   . Hearing loss in left ear    both ears now  . History of Clostridium difficile    multiple times 2016--s/p fecal transplant (Dr. Drue Second).  Marland Kitchen Hx of echocardiogram    Echo (03/2014):  Mild LVH, EF 50-55%, no RWMA, Gr 1 DD, mild MR, mild reduced RVSF  . Hypertension   . Internal hemorrhoids   . Iron deficiency anemia, unspecified 6/09  . Mitral valve prolapse    h/o; normal echo 12/2011 with no MVP seen  . 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 Right ventricular dysfunction 01/23/2014   Secondary to obstruction of main pulmonary artery  . Ruptured thoracic aortic aneurysm (HCC) 01/23/2014  . Unspecified hypothyroidism     Current Outpatient Prescriptions on File Prior to Visit  Medication Sig Dispense Refill  . aspirin 81 MG tablet Take 81 mg by mouth at bedtime.     Marland Kitchen atorvastatin (LIPITOR) 10 MG tablet TAKE 1 TABLET BY MOUTH  DAILY 90 tablet 0  . EPIPEN 2-PAK 0.3 MG/0.3ML SOAJ injection Reported on 11/08/2015  1  .  Ferrous Sulfate (IRON) 325 (65 FE) MG TABS Take 1 tablet by mouth every other day. Reported on 11/08/2015    . ipratropium (ATROVENT) 0.03 % nasal spray Place 2 sprays into both nostrils daily.     . metoprolol (LOPRESSOR) 50 MG tablet TAKE 1 TABLET BY MOUTH TWO  TIMES DAILY 180 tablet 0  . omeprazole (PRILOSEC) 20 MG capsule TAKE 1 CAPSULE BY MOUTH  EVERY OTHER DAY 45 capsule 0  . SYNTHROID 50 MCG tablet TAKE 1 TABLET BY MOUTH  DAILY BEFORE BREAKFAST 90 tablet 0  . triamcinolone cream (KENALOG) 0.1 % Apply 1 application topically 2 (two) times daily. 454 g 0  . valsartan-hydrochlorothiazide (DIOVAN HCT) 160-12.5 MG tablet Take 1 tablet by mouth 2 (two) times daily. 180 tablet 3   No current facility-administered medications on file prior to visit.     No Known Allergies  Blood pressure (!)  152/80, pulse 64, SpO2 99 %.  Essential hypertension: BP remains significantly above goal and patient is very sensitive to medication changes.  Patient also reluctant to more aggressive approach to therapy.  Will start taking hydralazine 25 mg twice daily as previously recommended but will change the 2nd daily dose to bedtime to improved tolerability.  Patient to keep records of daily BP and email the document to me in 2 weeks. Plan to increase hydralazine to 25 mg TID if patient tolerating BID dose.  Face to face follow up in 4 weeks at the HTN clinic.  Ravin Bendall Rodriguez-Guzman PharmD, BCPS [facility] [address] [place] [address] 10/07/2016 9:53 PM

## 2016-10-07 NOTE — Patient Instructions (Addendum)
Return for a  follow up appointment in 4 weeks (follow up by phone/email in 2 weeks)  Your blood pressure today is 152/80 pulse 64  Check your blood pressure at home daily (if able) and keep record of the readings.  Take your BP meds as follows: Valsartan/HCTZ 160/12.5mg  daily Metoprolol 50mg  twice daily Hydralazine 25mg  twice daily (morning and bedtime)  **Email BP home records to [email or web address]**  February/20/18  Bring all of your meds, your BP cuff and your record of home blood pressures to your next appointment.  Exercise as you're able, try to walk approximately 30 minutes per day.  Keep salt intake to a minimum, especially watch canned and prepared boxed foods.  Eat more fresh fruits and vegetables and fewer canned items.  Avoid eating in fast food restaurants.    HOW TO TAKE YOUR BLOOD PRESSURE: . Rest 5 minutes before taking your blood pressure. .  Don't smoke or drink caffeinated beverages for at least 30 minutes before. . Take your blood pressure before (not after) you eat. . Sit comfortably with your back supported and both feet on the floor (don't cross your legs). . Elevate your arm to heart level on a table or a desk. . Use the proper sized cuff. It should fit smoothly and snugly around your bare upper arm. There should be enough room to slip a fingertip under the cuff. The bottom edge of the cuff should be 1 inch above the crease of the elbow. . Ideally, take 3 measurements at one sitting and record the average.

## 2016-10-09 DIAGNOSIS — J301 Allergic rhinitis due to pollen: Secondary | ICD-10-CM | POA: Diagnosis not present

## 2016-10-09 DIAGNOSIS — J3089 Other allergic rhinitis: Secondary | ICD-10-CM | POA: Diagnosis not present

## 2016-10-14 DIAGNOSIS — J301 Allergic rhinitis due to pollen: Secondary | ICD-10-CM | POA: Diagnosis not present

## 2016-10-14 DIAGNOSIS — J3089 Other allergic rhinitis: Secondary | ICD-10-CM | POA: Diagnosis not present

## 2016-10-16 ENCOUNTER — Encounter: Payer: Self-pay | Admitting: Family Medicine

## 2016-10-16 MED ORDER — TRIAMCINOLONE ACETONIDE 0.1 % EX CREA: 1.0000 "application " | TOPICAL_CREAM | Freq: Two times a day (BID) | CUTANEOUS | 0 refills | Status: DC

## 2016-10-18 ENCOUNTER — Other Ambulatory Visit: Payer: Self-pay | Admitting: Family Medicine

## 2016-10-21 NOTE — Progress Notes (Addendum)
Chief Complaint  Patient presents with  . Medicare Wellness    nonfasting annual wellness exam. Has constant gas and his stool are runny x couple months.     Danny Frederick is a 83 y.o. male who presents for annual wellness visit and follow-up on chronic medical conditions.  He has the following concerns:  H/o recurrent C.diff, s/p fecal transplant. He reports diarrhea stopped, stools were back to normal.  However, he has noted increasing gas and stools have been a little loose in the last couple of months.  Denies any change in diet.  The only thing that has changed has been his BP medications (hydralazine added). He stopped his longterm metamucil, and it helped just a little.  Skin around rectum is itchy, he thinks related to the frequent stools.  He is using hydrocortisone prn with good results.  Also uses Tuck's pads.  Hypertension:  His blood pressure has been difficult to control, with a lot of fluctuations.  He sees Dr. Stanford Breed (and the pharmacist in the cardiology office), who has been managing his medications.  Hydralazine was added 09/2016.  He got dizzy when taking it twice daily, so only takes it once a day.  BP's are 130's in the mornings, 150 or higher in the afternoons.  He is seeing the pharmacist for med adjustments. He denies headaches, chest pain, DOE.  Hyperlipidemia--tolerating atorvastatin, no longer having any leg cramps. Lab Results  Component Value Date   CHOL 149 04/08/2016   HDL 59 04/08/2016   LDLCALC 72 04/08/2016   TRIG 90 04/08/2016   CHOLHDL 2.5 04/08/2016    GERD: He continues to take omeprazole every other day with good results. He has h/o iron deficiency, and he continues to take iron every other day on the days he doesn't take the omeprazole. He has had recurrence of reflux when PPI was stopped, and recurrence of anemia when iron was stopped in the past. Denies dysphagia.  Hypothyroidism: Denies any symptoms--no changes in weight, moods, hair/skin.  Bowel changes as above.  Some decrease in energy--walks 30 minutes rather than an hour at the gym, less strenous.  Only goes 2x/week rather than 3, it is "too much" (too tiring) to go three times. Takes medication on an empty stomach, separate from other medications. Lab Results  Component Value Date   TSH 2.65 04/08/2016    He sees Dr. Ricard Dillon yearly, in July.  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  He hasn't had any problems.  H/o depression--on and off citalopram.  "I'm fine", no longer having any problems. Wife's anxiety is much better (on treatment). Less arguing with her now, and overall moods are very good.   Immunization History  Administered Date(s) Administered  . Influenza Split 06/13/2011  . Influenza, High Dose Seasonal PF 06/09/2013, 05/23/2014, 05/14/2015, 04/23/2016  . Pneumococcal Conjugate-13 06/15/2013  . Pneumococcal Polysaccharide-23 09/02/2003, 10/03/2015  . Tdap 10/02/2005, 10/04/2015  . Zoster 03/11/2005   Last colonoscopy: 12/09, and again 10/2014 at time of fecal transplant. Skin tag and diverticulosis were noted. Last PSA: January 2016 Dentist: once a year Ophtho: yearly Exercise: gym 2x/week, and walks 2-3 times/week for 30 minutes.  At gym he uses (weights, recumbent bike, treadmill. Vitamin D 34 in 12/2013  Other doctors caring for patient include: Neurosurgeon: Dr. Christella Noa (no longer sees) General surgeon (hemorrhoids): Dr. Redmond Pulling (no  longer sees) Ophtho: [facility]: Dr. Hilarie Fredrickson  Dentist -- previously Dr. Archie Balboa, now seeing his replacement at same office Dr. Valda Favia at [facility] (audiologist) ID--Dr. Baxter Flattery CV surgeon--Dr. Roxy Manns Cardiology--Dr. Stanford Breed Allergist--Dr. Orvil Feil Orthopedist--Dr. Supple  Depression screen: Negative Fall screen: negative Functional  status screen: unremarkable.   See full screens in epic.  End of Life Discussion:  Patient has a living will and medical power of attorney  Past Medical History:  Diagnosis Date  . Allergic conjunctivitis   . Allergic rhinitis, cause unspecified    on allergy shots (Dr. Orvil Feil)  . Atherosclerosis of aorta (HCC)    noted on CT angio of abd/pelvis, in many vessels  . BPH (benign prostatic hypertrophy)   . Diverticulosis of colon 12/09  . Foot drop, right   . GERD (gastroesophageal reflux disease)   . Hearing loss in left ear    both ears now  . History of Clostridium difficile    multiple times 2016--s/p fecal transplant (Dr. Baxter Flattery).  Marland Kitchen Hx of echocardiogram    Echo (03/2014):  Mild LVH, EF 50-55%, no RWMA, Gr 1 DD, mild MR, mild reduced RVSF  . Hypertension   . Internal hemorrhoids   . Iron deficiency anemia, unspecified 6/09  . Mitral valve prolapse    h/o; normal echo 12/2011 with no MVP seen  . 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 Right ventricular dysfunction 01/23/2014   Secondary to obstruction of main pulmonary artery  . Ruptured thoracic aortic aneurysm ([place]) 01/23/2014  . Unspecified hypothyroidism     Past Surgical History:  Procedure Laterality Date  . CERVICAL LAMINECTOMY  1972   C5-6  . COLONOSCOPY WITH PROPOFOL N/A 11/09/2014   Procedure: COLONOSCOPY WITH PROPOFOL;  Surgeon: Jerene Bears, MD;  Location: [facility];  Service: Gastroenterology;  Laterality: N/A;  . ESOPHAGOGASTRODUODENOSCOPY  10/31/08   normal; Dr. Edison Nasuti  . FECAL TRANSPLANT N/A 11/09/2014   Procedure: FECAL TRANSPLANT;  Surgeon: Jerene Bears, MD;  Location: [facility];  Service: Gastroenterology;  Laterality: N/A;  . HEMORRHOIDECTOMY WITH HEMORRHOID BANDING  04/07/13  . hemorroidal banding  04/07/2013   x3-Dr.Eric Redmond Pulling  . PROSTATE SURGERY  2007   photovaporization  . ROTATOR CUFF REPAIR  10/2008   left; Dr. Onnie Graham  . SPINE SURGERY  2006   L4-5 disk surgery  . SPINE SURGERY  04/2013    L4-5, L5-S1 fusion.  Dr. Christella Noa  . THORACIC AORTIC ANEURYSM REPAIR N/A 01/23/2014   Procedure: THORACIC ASCENDING ANEURYSM REPAIR (AAA);  Surgeon: Rexene Alberts, MD;  Location: [facility];  Service: Open Heart Surgery;  Laterality: N/A;  . TONSILLECTOMY      Social History   Social History  . Marital status: Married    Spouse name: N/A  . Number of children: 2  . Years of education: N/A   Occupational History  . Not on file.   Social History Main Topics  . Smoking status: Former Smoker    Types: Cigarettes    Quit date: 09/01/1977  . Smokeless tobacco: Never Used  . Alcohol use Yes     Comment: 1-2 glasses wine per day  . Drug use: No  . Sexual activity: Not on file   Other Topics Concern  . Not on file   Social History Narrative   Lives with wife.  (no longer has pet rats).  1 daughter in [place], 1 daughter in [place], [place]    Family History  Problem Relation Age of Onset  . Diabetes Mother   .  Heart disease Mother     CHF  . Hypertension Mother   . Depression Mother   . Stroke Father   . Parkinsonism Father   . Hypertension Father   . Cancer Brother     bladder cancer  . Cancer Brother     prostate cancer  . Emphysema Brother   . Diabetes Sister   . Colon cancer Neg Hx     Outpatient Encounter Prescriptions as of 10/22/2016  Medication Sig Note  . aspirin 81 MG tablet Take 81 mg by mouth at bedtime.    Marland Kitchen atorvastatin (LIPITOR) 10 MG tablet TAKE 1 TABLET BY MOUTH  DAILY   . Ferrous Sulfate (IRON) 325 (65 FE) MG TABS Take 1 tablet by mouth every other day. Reported on 11/08/2015   . hydrALAZINE (APRESOLINE) 25 MG tablet Take 1 tablet (25 mg total) by mouth 2 (two) times daily. 10/22/2016: Taking one daily  . ipratropium (ATROVENT) 0.03 % nasal spray Place 2 sprays into both nostrils daily.    . metoprolol (LOPRESSOR) 50 MG tablet TAKE 1 TABLET BY MOUTH TWO  TIMES DAILY   . omeprazole (PRILOSEC) 20 MG capsule TAKE 1 CAPSULE BY MOUTH  EVERY OTHER DAY   .  SYNTHROID 50 MCG tablet TAKE 1 TABLET BY MOUTH  DAILY BEFORE BREAKFAST   . triamcinolone cream (KENALOG) 0.1 % Apply 1 application topically 2 (two) times daily.   . valsartan-hydrochlorothiazide (DIOVAN HCT) 160-12.5 MG tablet Take 1 tablet by mouth 2 (two) times daily. 10/22/2016: Taking only once daily  . EPIPEN 2-PAK 0.3 MG/0.3ML SOAJ injection Reported on 11/08/2015    No facility-administered encounter medications on file as of 10/22/2016.     No Known Allergies   ROS: The patient denies anorexia, fever, headaches,ear pain, hoarseness, chest pain, palpitations, dizziness, syncope, dyspnea on exertion, cough, swelling, nausea, vomiting, constipation, abdominal pain, melena, hematochezia, indigestion/heartburn, hematuria, incontinence, erectile dysfunction, nocturia, weakened urine stream, dysuria, genital lesions, joint pains, weakness, tremor, suspicious skin lesions, depression, anxiety, abnormal bleeding/bruising, or enlarged lymph nodes. Hearing loss-- has bilateral hearing aids. Itchy skin on his arms and legs in the winter. Rectal itching since gas and loose stools (relieved by hydrocortisone) Slight intermittent cough (small amount of phlegm, not discolored), unchanged.  Allergies are well controlled. Some bilateral hip pain--previously did well with tylenol before walking. This is sporadic, so no longer remembers to take the tylenol. Some sinus headaches last week. Rare, intermittent tingling in his fingers (since neck surgery).  Denies any UE weakness. Loose stools and gas per HPI.   PHYSICAL EXAM:  BP (!) 160/88 (BP Location: Left Arm, Patient Position: Sitting, Cuff Size: Normal)   Pulse 60   Ht 5' 3.75" (1.619 m)   Wt 146 lb 9.6 oz (66.5 kg)   BMI 25.36 kg/m   General Appearance:   Alert, cooperative, no distress, appears stated age  Head:   Normocephalic, without obvious abnormality, atraumatic  Eyes:   PERRL, conjunctiva/corneas clear, EOM's intact,  fundi   benign  Ears:   normal external ears. +hearing aids  Nose:  Nares normal, mucosa normal, no drainage or sinus tenderness  Throat:  Lips, mucosa, and tongue normal; upper dentures  Neck:  Supple, no lymphadenopathy; thyroid: no enlargement/tenderness/nodules; no carotid bruit or JVD  Back:  Spine nontender, no curvature, no CVA tenderness. WHSS midline spine,  Lungs:   Clear to auscultation bilaterally without wheezes, rales or ronchi; respirations unlabored. Very faint crackles at both bases.  Chest Wall:   No  tenderness. WHSS midline chest, with many small vessels across the scar (making scar appear blue-tinged)   Heart:   Regular rate and rhythm, S1 and S2 normal, no murmur, rub or gallop  Breast Exam:   No chest wall tenderness, masses or gynecomastia  Abdomen:   Soft, non-tender, nondistended, normoactive bowel sounds, no masses, no hepatosplenomegaly  Genitalia:   Normal male external genitalia without lesions. Testicles without masses. No inguinal hernias.  Rectal:   Normal sphincter tone, no masses or tenderness; guaiac negative stool. Prostate smooth, no nodules, not enlarged.some white cream (HC) noted, no erythema, rash/lesions in perianal area noted  Extremities:  No clubbing, cyanosis or edema  Pulses:  2+ and symmetric all extremities  Skin:  Skin color, texture, turgor normal, no rashes or lesions. Some actinic changes to skin, but no precancerous or suspicious lesions noted  Lymph nodes:  Cervical, supraclavicular, and axillary nodes normal  Neurologic:  CNII-XII intact, normal strength, sensation and gait; reflexes 2+ and symmetric throughout   Psych: Normal mood, affect, hygiene and grooming.    ASSESSMENT/PLAN:  Medicare annual wellness visit, subsequent  Pure hypercholesterolemia - well controlled per August labs; continue current meds - Plan:  Comprehensive metabolic panel  Essential hypertension, benign - BP elevated today; not able to tolerate hydralazine as rx'd; f/u with cardiology - Plan: Comprehensive metabolic panel  Depression, major, in remission ([place])  Atherosclerosis of aorta (HCC)  Gastroesophageal reflux disease, esophagitis presence not specified - controlled with qod PPI use  Other fatigue - Plan: TSH, CBC with Differential/Platelet, VITAMIN D 25 Hydroxy (Vit-D Deficiency, Fractures), Comprehensive metabolic panel  Bowel habit changes - discussed diet, probiotics.  Given orders and containers for stool studies if not improving. - Plan: TSH, Comprehensive metabolic panel  History of Clostridium difficile infection - s/p fecal transplant  Fatigue-- CBC, TSH, c-met, vit D. (nonfasting)  Discussed brand vs generic Synthroid, and lower cost pharmacies.  Prefers to stay with same pharmacy.   Discussed PSA screening (risks/benefits)--declines screening this year; recommended at least 30 minutes of aerobic activity at least 5 days/week, weight-bearing exercise at least 2x/week; proper sunscreen use reviewed; healthy diet and alcohol recommendations (less than or equal to 2 drinks/day) reviewed; regular seatbelt use; changing batteries in smoke detectors. Immunization recommendations discussed--UTD. Continue yearly flu shots.  Shingrix recommended when available. Colonoscopy recommendations reviewed, UTD.  Full Code/Full Care. Has POA and living will. MOST form reviewed and updated.  Try lactose-free diet for 1-2 weeks.  Try taking a probiotic daily. I recommend that if your loose stools aren't improving with these measures, give a stool sample to be checked for recurrent C.diff or other infection.  Medicare Attestation I have personally reviewed: The patient's medical and social history Their use of alcohol, tobacco or illicit drugs Their current medications and supplements The patient's functional ability  including ADLs,fall risks, home safety risks, cognitive, and hearing and visual impairment Diet and physical activities Evidence for depression or mood disorders  The patient's weight, height, and BMI have have been recorded in the chart.  I have made referrals, counseling, and provided education to the patient based on review of the above and I have provided the patient with a written personalized care plan for preventive services.     Daegen Berrocal A, MD   10/21/2016

## 2016-10-22 ENCOUNTER — Ambulatory Visit (INDEPENDENT_AMBULATORY_CARE_PROVIDER_SITE_OTHER): Payer: Medicare Other | Admitting: Family Medicine

## 2016-10-22 ENCOUNTER — Encounter: Payer: Self-pay | Admitting: Family Medicine

## 2016-10-22 VITALS — BP 160/88 | HR 60 | Ht 63.75 in | Wt 146.6 lb

## 2016-10-22 DIAGNOSIS — J3089 Other allergic rhinitis: Secondary | ICD-10-CM | POA: Diagnosis not present

## 2016-10-22 DIAGNOSIS — Z8619 Personal history of other infectious and parasitic diseases: Secondary | ICD-10-CM

## 2016-10-22 DIAGNOSIS — E7800 Pure hypercholesterolemia, unspecified: Secondary | ICD-10-CM

## 2016-10-22 DIAGNOSIS — F325 Major depressive disorder, single episode, in full remission: Secondary | ICD-10-CM

## 2016-10-22 DIAGNOSIS — K219 Gastro-esophageal reflux disease without esophagitis: Secondary | ICD-10-CM

## 2016-10-22 DIAGNOSIS — Z0000 Encounter for general adult medical examination without abnormal findings: Secondary | ICD-10-CM

## 2016-10-22 DIAGNOSIS — I700 Atherosclerosis of aorta: Secondary | ICD-10-CM

## 2016-10-22 DIAGNOSIS — J301 Allergic rhinitis due to pollen: Secondary | ICD-10-CM | POA: Diagnosis not present

## 2016-10-22 DIAGNOSIS — I10 Essential (primary) hypertension: Secondary | ICD-10-CM | POA: Diagnosis not present

## 2016-10-22 DIAGNOSIS — R5383 Other fatigue: Secondary | ICD-10-CM | POA: Diagnosis not present

## 2016-10-22 DIAGNOSIS — E039 Hypothyroidism, unspecified: Secondary | ICD-10-CM | POA: Diagnosis not present

## 2016-10-22 DIAGNOSIS — R194 Change in bowel habit: Secondary | ICD-10-CM

## 2016-10-22 LAB — CBC WITH DIFFERENTIAL/PLATELET
BASOS PCT: 0 %
Basophils Absolute: 0 cells/uL (ref 0–200)
EOS ABS: 222 {cells}/uL (ref 15–500)
Eosinophils Relative: 3 %
HCT: 45.3 % (ref 38.5–50.0)
Hemoglobin: 15.1 g/dL (ref 13.2–17.1)
LYMPHS PCT: 33 %
Lymphs Abs: 2442 cells/uL (ref 850–3900)
MCH: 28 pg (ref 27.0–33.0)
MCHC: 33.3 g/dL (ref 32.0–36.0)
MCV: 84 fL (ref 80.0–100.0)
MONOS PCT: 10 %
MPV: 9.4 fL (ref 7.5–12.5)
Monocytes Absolute: 740 cells/uL (ref 200–950)
NEUTROS PCT: 54 %
Neutro Abs: 3996 cells/uL (ref 1500–7800)
Platelets: 303 10*3/uL (ref 140–400)
RBC: 5.39 MIL/uL (ref 4.20–5.80)
RDW: 14 % (ref 11.0–15.0)
WBC: 7.4 10*3/uL (ref 4.0–10.5)

## 2016-10-22 LAB — COMPREHENSIVE METABOLIC PANEL
ALBUMIN: 4.2 g/dL (ref 3.6–5.1)
ALT: 20 U/L (ref 9–46)
AST: 27 U/L (ref 10–35)
Alkaline Phosphatase: 81 U/L (ref 40–115)
BUN: 20 mg/dL (ref 7–25)
CALCIUM: 9.8 mg/dL (ref 8.6–10.3)
CHLORIDE: 99 mmol/L (ref 98–110)
CO2: 30 mmol/L (ref 20–31)
Creat: 1.26 mg/dL — ABNORMAL HIGH (ref 0.70–1.11)
GLUCOSE: 86 mg/dL (ref 65–99)
POTASSIUM: 4.2 mmol/L (ref 3.5–5.3)
Sodium: 138 mmol/L (ref 135–146)
Total Bilirubin: 0.7 mg/dL (ref 0.2–1.2)
Total Protein: 8.5 g/dL — ABNORMAL HIGH (ref 6.1–8.1)

## 2016-10-22 LAB — TSH: TSH: 2.92 mIU/L (ref 0.40–4.50)

## 2016-10-22 NOTE — Patient Instructions (Addendum)
  HEALTH MAINTENANCE RECOMMENDATIONS:  It is recommended that you get at least 30 minutes of aerobic exercise at least 5 days/week (for weight loss, you may need as much as 60-90 minutes). This can be any activity that gets your heart rate up. This can be divided in 10-15 minute intervals if needed, but try and build up your endurance at least once a week.  Weight bearing exercise is also recommended twice weekly.  Eat a healthy diet with lots of vegetables, fruits and fiber.  "Colorful" foods have a lot of vitamins (ie green vegetables, tomatoes, red peppers, etc).  Limit sweet tea, regular sodas and alcoholic beverages, all of which has a lot of calories and sugar.  Up to 2 alcoholic drinks daily may be beneficial for men (unless trying to lose weight, watch sugars).  Drink a lot of water.  Sunscreen of at least SPF 30 should be used on all sun-exposed parts of the skin when outside between the hours of 10 am and 4 pm (not just when at beach or pool, but even with exercise, golf, tennis, and yard work!)  Use a sunscreen that says "broad spectrum" so it covers both UVA and UVB rays, and make sure to reapply every 1-2 hours.  Remember to change the batteries in your smoke detectors when changing your clock times in the spring and fall.  Use your seat belt every time you are in a car, and please drive safely and not be distracted with cell phones and texting while driving.   Danny Frederick , Thank you for taking time to come for your Medicare Wellness Visit. I appreciate your ongoing commitment to your health goals. Please review the following plan we discussed and let me know if I can assist you in the future.   These are the goals we discussed: Goals    None      This is a list of the screening recommended for you and due dates:  Health Maintenance  Topic Date Due  . Tetanus Vaccine  10/03/2025  . Flu Shot  Completed  . Pneumonia vaccines  Completed   I recommend getting the new shingles  vaccine (Shingrix) when available. You will need to check with your insurance to see if it is covered, and if covered by Medicare Part D, you need to get from the pharmacy rather than our office.  It is a series of 2 injections, spaced 2 months apart.  Please get [place] copies of your Living will and healthcare power of attorney at your convenience.  Try lactose-free diet for 1-2 weeks.  Try taking a probiotic daily. I recommend that if your loose stools aren't improving with these measures, give a stool sample to be checked for recurrent C.diff or other infection.

## 2016-10-23 LAB — VITAMIN D 25 HYDROXY (VIT D DEFICIENCY, FRACTURES): Vit D, 25-Hydroxy: 26 ng/mL — ABNORMAL LOW (ref 30–100)

## 2016-10-23 MED ORDER — SYNTHROID 50 MCG PO TABS: ORAL_TABLET | ORAL | 3 refills | Status: DC

## 2016-10-23 NOTE — Addendum Note (Signed)
Addended by: Rita Ohara on: 10/23/2016 07:43 AM   Modules accepted: Orders

## 2016-10-24 DIAGNOSIS — J3089 Other allergic rhinitis: Secondary | ICD-10-CM | POA: Diagnosis not present

## 2016-10-24 DIAGNOSIS — J301 Allergic rhinitis due to pollen: Secondary | ICD-10-CM | POA: Diagnosis not present

## 2016-10-27 ENCOUNTER — Telehealth: Payer: Self-pay | Admitting: Pharmacist

## 2016-10-27 NOTE — Telephone Encounter (Signed)
Patient decreased hydralazine dose to once daily dur to diarrhea and dizziness. Still taking Dioval HCT and Motoprolol as well.   Change administration times to:  Diovan HCT every morning Metoprolol 50mg  every morning and evening Hydralazine 25mg  eveday day at noon.   F/U appointment  11/24/16

## 2016-11-19 ENCOUNTER — Encounter: Payer: Self-pay | Admitting: Pharmacist Clinician (PhC)/ Clinical Pharmacy Specialist

## 2016-11-19 ENCOUNTER — Ambulatory Visit (INDEPENDENT_AMBULATORY_CARE_PROVIDER_SITE_OTHER): Payer: Medicare Other | Admitting: Pharmacist Clinician (PhC)/ Clinical Pharmacy Specialist

## 2016-11-19 DIAGNOSIS — I10 Essential (primary) hypertension: Secondary | ICD-10-CM

## 2016-11-19 MED ORDER — AMLODIPINE BESYLATE 2.5 MG PO TABS: 2.5000 mg | ORAL_TABLET | Freq: Every day | ORAL | 3 refills | Status: DC

## 2016-11-19 NOTE — Patient Instructions (Addendum)
Return for a a follow up appointment in 6 weeks  Your blood pressure today is 142/82 (goal is < 130/80)  Check your blood pressure at home most days of the week and keep record of the readings.  Take your BP meds as follows:  Continue hydralazine until amlodipine 2.5 mg prescription arrives  Take valsartan/hctz and metoprolol each morning  Take amlodipine and metoprolol each evening  About 2 weeks after starting amlodipine please email your blood pressure readings to [facility].   Bring all of your meds, your BP cuff and your record of home blood pressures to your next appointment.  Exercise as you're able, try to walk approximately 30 minutes per day.  Keep salt intake to a minimum, especially watch canned and prepared boxed foods.  Eat more fresh fruits and vegetables and fewer canned items.  Avoid eating in fast food restaurants.    HOW TO TAKE YOUR BLOOD PRESSURE: . Rest 5 minutes before taking your blood pressure. .  Don't smoke or drink caffeinated beverages for at least 30 minutes before. . Take your blood pressure before (not after) you eat. . Sit comfortably with your back supported and both feet on the floor (don't cross your legs). . Elevate your arm to heart level on a table or a desk. . Use the proper sized cuff. It should fit smoothly and snugly around your bare upper arm. There should be enough room to slip a fingertip under the cuff. The bottom edge of the cuff should be 1 inch above the crease of the elbow. . Ideally, take 3 measurements at one sitting and record the average.

## 2016-11-19 NOTE — Progress Notes (Signed)
Patient ID: Danny Frederick                 DOB: [birth year]                      MRN: [id number]     HPI:  Danny Frederick is a 83 y.o. male patient of Dr. Stanford Breed with cardiac PMH below who presents today for hypertension follow up. He states he has a long history of hypertension (25 years), and in the past had taken both amlodipine and lisinopril (although not at the same time).  He reports no medication has ever had a significant impact on his blood pressure.  Patient was unable to tolerate valsartan/HCTZ 160/12.5mg  BID in the past.  He also has trouble with hydralazine more than 25 mg once daily.    Since his last visit he reports feeling well, although still has occasional bout of dizziness, has not had any falls.  He cannot correlate this dizziness to low blood pressure readings.  Cardiac Hx: Htn, Aortic aneurysm, hypothyroidism  Current HTN meds: Valsartan/HCTZ 160/12.5mg  daily (am) Metoprolol 50mg  twice daily (bid) Hydralazine 25mg  daily (noon)  Previously tried:amlodipine, lisinopril (no problems with either that he recalls)  BP goal: <130/80 - pt reports Dr. Stanford Breed prefers closer to 120/80  Family History: He believes htn runs in his family, but he can't be sure.   2 daughters - neither with hypertension  2 brothers both deceased, no heart disease  1 sister with COPD, other in good health  Social History: He quit smoking in 1979. He endorses drinking a couple glasses of wine per day.  Caffeine - hot tea 3-4 cups per day  Diet:He eats most of his meals from home and rarely will add salt. He drinks tea mostly.   Exercise:He works out at [employer] 2 days per week and walks most days. Gym - some weight resistance/ aerobic activity  Home BP readings: had 9 readings over past few weeks, average 137/77.  Range [id number] systolic    Wt Readings from Last 3 Encounters:  10/22/16 146 lb 9.6 oz (66.5 kg)  06/06/16 143 lb (64.9 kg)  05/12/16 146 lb (66.2 kg)   BP  Readings from Last 3 Encounters:  11/19/16 (!) 142/82  10/22/16 (!) 160/88  10/07/16 (!) 152/80   Pulse Readings from Last 3 Encounters:  11/19/16 60  10/22/16 60  10/07/16 64     Past Medical History:  Diagnosis Date  . Allergic conjunctivitis   . Allergic rhinitis, cause unspecified    on allergy shots (Dr. Orvil Feil)  . Atherosclerosis of aorta (HCC)    noted on CT angio of abd/pelvis, in many vessels  . BPH (benign prostatic hypertrophy)   . Diverticulosis of colon 12/09  . Foot drop, right   . GERD (gastroesophageal reflux disease)   . Hearing loss in left ear    both ears now  . History of Clostridium difficile    multiple times 2016--s/p fecal transplant (Dr. Baxter Flattery).  Marland Kitchen Hx of echocardiogram    Echo (03/2014):  Mild LVH, EF 50-55%, no RWMA, Gr 1 DD, mild MR, mild reduced RVSF  . Hypertension   . Internal hemorrhoids   . Iron deficiency anemia, unspecified 6/09  . Mitral valve prolapse    h/o; normal echo 12/2011 with no MVP seen  . 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 Right ventricular dysfunction 01/23/2014   Secondary to obstruction of main pulmonary artery  . Ruptured thoracic aortic  aneurysm ([place]) 01/23/2014  . Unspecified hypothyroidism     Current Outpatient Prescriptions on File Prior to Visit  Medication Sig Dispense Refill  . aspirin 81 MG tablet Take 81 mg by mouth at bedtime.     Marland Kitchen atorvastatin (LIPITOR) 10 MG tablet TAKE 1 TABLET BY MOUTH  DAILY 90 tablet 0  . EPIPEN 2-PAK 0.3 MG/0.3ML SOAJ injection Reported on 11/08/2015  1  . Ferrous Sulfate (IRON) 325 (65 FE) MG TABS Take 1 tablet by mouth every other day. Reported on 11/08/2015    . hydrALAZINE (APRESOLINE) 25 MG tablet Take 1 tablet (25 mg total) by mouth 2 (two) times daily. (Patient taking differently: Take 25 mg by mouth daily. ) 180 tablet 1  . ipratropium (ATROVENT) 0.03 % nasal spray Place 2 sprays into both nostrils daily.     . metoprolol (LOPRESSOR) 50 MG tablet TAKE 1 TABLET BY MOUTH  TWO  TIMES DAILY 180 tablet 0  . omeprazole (PRILOSEC) 20 MG capsule TAKE 1 CAPSULE BY MOUTH  EVERY OTHER DAY 45 capsule 0  . SYNTHROID 50 MCG tablet TAKE 1 TABLET BY MOUTH  DAILY BEFORE BREAKFAST 90 tablet 3  . triamcinolone cream (KENALOG) 0.1 % Apply 1 application topically 2 (two) times daily. 454 g 0  . valsartan-hydrochlorothiazide (DIOVAN HCT) 160-12.5 MG tablet Take 1 tablet by mouth daily.  180 tablet 3   No current facility-administered medications on file prior to visit.     No Known Allergies  Blood pressure (!) 142/82, pulse 60.  Essential hypertension:  Patient with hypertension, has not been able to get to goal in the past.  Part of his problem relates to being unable to tolerate effective doses of several different medications.  His chart indicates he previously took amlodipine, and he believes it was just traded out for lisinopril, no adverse reactions.  I don't believe the 25 mg of hydralazine once daily is of any appreciable benefit, so am going to discontinue that and restart amlodipine at 2.5 mg daily.  He understands that will probably need to increase to 5 mg daily after a few weeks.  He will continue with the hydralazine until the new medication arrives thru mail order.  Will see him in 6 weeks for follow up.  He is to continue with regular home BP checks.      Tommy Medal PharmD, CPP, [place] [address] [place] [address] 11/19/2016 4:45 PM

## 2016-11-19 NOTE — Assessment & Plan Note (Addendum)
Patient with hypertension, has not been able to get to goal in the past.  Part of his problem relates to being unable to tolerate effective doses of several different medications.  His chart indicates he previously took amlodipine, and he believes it was just traded out for lisinopril, no adverse reactions.  I don't believe the 25 mg of hydralazine once daily is of any appreciable benefit, so am going to discontinue that and restart amlodipine at 2.5 mg daily.  He understands that will probably need to increase to 5 mg daily after a few weeks.  He will continue with the hydralazine until the new medication arrives thru mail order.  Will see him in 6 weeks for follow up.  He is to continue with regular home BP checks.

## 2016-11-20 ENCOUNTER — Ambulatory Visit (INDEPENDENT_AMBULATORY_CARE_PROVIDER_SITE_OTHER): Payer: Medicare Other | Admitting: Family Medicine

## 2016-11-20 ENCOUNTER — Encounter: Payer: Self-pay | Admitting: Family Medicine

## 2016-11-20 VITALS — BP 140/80 | HR 64 | Ht 63.75 in | Wt 145.8 lb

## 2016-11-20 DIAGNOSIS — L820 Inflamed seborrheic keratosis: Secondary | ICD-10-CM

## 2016-11-20 NOTE — Patient Instructions (Signed)
Keep the area clean.  Expect it to blister and scab.  Use antibacterial ointment until it has healed.  Hopefully the dark area will completely come off with the scab.  If not, feel free to return for additional treatment.

## 2016-11-20 NOTE — Progress Notes (Signed)
Chief Complaint  Patient presents with  . Mass    on left thigh, catches in his pants and bleeds from time to time-would like to see if he can have removed.    For many years has had a mole/lump at the left thigh, which has picked off/shed, but then recurs.  Over the last few months it has gotten a little inflamed--gets caught on his trousers, and will bleed some.  It is not painful.  He would like it removed.  PMH, PSH, SH reviewed Meds/allergies reviewed  ROS: no fever, chills, headaches, dizziness, chest pain, bleeding, bruising, other skin concerns.  Recently got back from a trip to [place].  PHYSICAL EXAM:  BP 140/80 (BP Location: Left Arm, Patient Position: Sitting, Cuff Size: Normal)   Pulse 64   Ht 5' 3.75" (1.619 m)   Wt 145 lb 12.8 oz (66.1 kg)   BMI 25.22 kg/m   Well appearing, pleasant elderly male in no distress Left upper thigh--raised, pigmented keratotic lesion consistent with seborrheic keratosis.  The medial edge is somewhat separated, and this is what is getting caught on his clothing.  Measures 12 x 61mm in size  ASSESSMENT/PLAN:  Seborrheic keratosis, inflamed - Plan: PR DESTRUC BENIGN/PREMAL,FIRST LESION   Discussed diagnosis, benign nature ,but getting inflamed. Discussed various treatment options--observation, cryotherapy, shave vs excision. Will try cryotherapy first.  Discussed expected course and healing. If any recurrence, can either re-freeze, vs excise.  Patient tolerated the procedure well.

## 2016-11-24 ENCOUNTER — Other Ambulatory Visit: Payer: Self-pay | Admitting: Family Medicine

## 2016-12-01 ENCOUNTER — Other Ambulatory Visit: Payer: Self-pay | Admitting: Family Medicine

## 2016-12-17 ENCOUNTER — Other Ambulatory Visit: Payer: Self-pay

## 2016-12-17 MED ORDER — VALSARTAN-HYDROCHLOROTHIAZIDE 160-12.5 MG PO TABS: 1.0000 | ORAL_TABLET | Freq: Every day | ORAL | 1 refills | Status: DC

## 2016-12-17 NOTE — Telephone Encounter (Signed)
Rx(s) sent to pharmacy electronically.  

## 2017-01-21 ENCOUNTER — Encounter: Payer: Self-pay | Admitting: Thoracic Surgery (Cardiothoracic Vascular Surgery)

## 2017-03-09 ENCOUNTER — Other Ambulatory Visit: Payer: Self-pay | Admitting: Family Medicine

## 2017-03-16 ENCOUNTER — Other Ambulatory Visit: Payer: Self-pay | Admitting: Family Medicine

## 2017-03-18 ENCOUNTER — Telehealth: Payer: Self-pay | Admitting: Cardiology

## 2017-03-18 NOTE — Telephone Encounter (Signed)
Agree with hydration and follow BP; PAOV if symptoms persist Kirk Ruths

## 2017-03-18 NOTE — Telephone Encounter (Signed)
Patient advised of recommendations, he was appreciative of help and will do as instructed. Aware to call back if not improved by home interventions so that we may accomodate him w/ APP visit.

## 2017-03-18 NOTE — Telephone Encounter (Signed)
Pt of Dr. Stanford Breed Re: faintness  Patient reports x2 weeks, he has been feeling faint early in the morning.  Denies syncope, but states "not feeling right". Still going to the gym but feeling like he will pass out while working out. He is on "a number of high blood pressure meds". States problem happens in the morning, but not in the afternoon or evening.  Reports an average blood pressure (last 21 tests) of 143/82. Has been followed in BP clinic for med adjustments.  Pt usually takes BP readings in the afternoon. Sometimes, but not typically, takes morning readings.  HR typically low 70s.  Also has had noticeable increase in urination in last 2 weeks. He's on Valsartan-HCTZ 160mg -12.5mg  BID and aware this has a diuretic effect - has been on this dose of the medication for 4-5 months. Worried about hydration but admits he probably does not drink enough water.  Recommended patient stay hydrated especially before/during exercise - some of this could be attributed to dehydration & heat/humidity. Pt aware I would defer to Dr. Stanford Breed for further advice & follow up with him.

## 2017-03-18 NOTE — Telephone Encounter (Signed)
Pt would like to be seen by somebody.He have been feeling faint.He feel this way right now.

## 2017-04-01 ENCOUNTER — Ambulatory Visit (INDEPENDENT_AMBULATORY_CARE_PROVIDER_SITE_OTHER): Payer: Medicare Other | Admitting: Physician Assistant

## 2017-04-01 ENCOUNTER — Encounter: Payer: Self-pay | Admitting: Physician Assistant

## 2017-04-01 VITALS — BP 158/78 | HR 56 | Ht 64.0 in | Wt 145.0 lb

## 2017-04-01 DIAGNOSIS — E039 Hypothyroidism, unspecified: Secondary | ICD-10-CM

## 2017-04-01 DIAGNOSIS — R5383 Other fatigue: Secondary | ICD-10-CM | POA: Diagnosis not present

## 2017-04-01 DIAGNOSIS — Z9889 Other specified postprocedural states: Secondary | ICD-10-CM | POA: Diagnosis not present

## 2017-04-01 DIAGNOSIS — R42 Dizziness and giddiness: Secondary | ICD-10-CM | POA: Diagnosis not present

## 2017-04-01 DIAGNOSIS — Z8679 Personal history of other diseases of the circulatory system: Secondary | ICD-10-CM | POA: Diagnosis not present

## 2017-04-01 DIAGNOSIS — R55 Syncope and collapse: Secondary | ICD-10-CM | POA: Diagnosis not present

## 2017-04-01 DIAGNOSIS — I10 Essential (primary) hypertension: Secondary | ICD-10-CM

## 2017-04-01 MED ORDER — IRBESARTAN 150 MG PO TABS: 150.0000 mg | ORAL_TABLET | Freq: Every day | ORAL | 1 refills | Status: DC

## 2017-04-01 NOTE — Patient Instructions (Addendum)
Medication Instructions:   STOP diovan-HCTZ  START irbesartan 150mg  DAILY  Labwork:   Your physician recommends that you get lab work drawn today: CBC and BMET  Testing/Procedures:  Your physician has recommended that you wear an event monitor. Event monitors are medical devices that record the heart's electrical activity. Doctors most often [place] these monitors to diagnose arrhythmias. Arrhythmias are problems with the speed or rhythm of the heartbeat. The monitor is a small, portable device. You can wear one while you do your normal daily activities. This is usually used to diagnose what is causing palpitations/syncope (passing out).   Your physician has requested that you have cardiac CT. Cardiac computed tomography (CT) is a painless test that uses an x-ray machine to take clear, detailed pictures of your heart. For further information please visit [facility]uge[facility]iesta[facility][email or web address]. Please follow instruction sheet as given.    Follow-Up:  2 months with Dr. Stanford Breed    If you need a refill on your cardiac medications before your next appointment, please call your pharmacy.

## 2017-04-01 NOTE — Progress Notes (Signed)
Cardiology Office Note    Date:  04/02/2017   ID:  Danny Frederick, DOB [birth year], MRN [id number]  PCP:  Rita Ohara, MD  Cardiologist:  Dr. Stanford Breed  Chief Complaint  Patient presents with  . Follow-up    seen for Dr. Stanford Breed    History of Present Illness:  Danny Frederick is a 83 y.o. male with PMH of TAA repair, micturition syncope, HTN and hypothyroidism. He was admitted in May 2015 with a contained ruptured ascending thoracic aortic aneurysm in the setting of streptococcal pneumoniae bacteremia and aortitis. He underwent repair of the ascending thoracic aortic aneurysm with a 32 mm Hemashield straight graft. He was also followed by infectious disease and treated with antibiotics. He was noted to have a clot in the main pulmonary artery, and this was likely related to compression on the pulmonary artery by the false aneurysm. He was started on Coumadin. Echocardiogram obtained in July 2015 showed normal LV function, mild MR, mild RVE. Last CT angiogram of the chest in July 2017 showed no evidence of complication. He was last seen by Dr. Stanford Breed on 06/06/2016, his blood pressure was elevated, lisinopril was increased to 40 mg daily. He was referred to hypertension clinic. His medication was later served changed to valsartan/HCTZ. He is also on amlodipine, and metoprolol.  In the past 2 weeks, he has been having several episodes of dizziness. Interestingly enough, when he measured his blood pressure right afterward, his blood pressure is usually normal. He denies any dizziness when he changes body positions. Suspicion for orthostatic hypotension relatively low. He has been instructed to drink an additional amount of fluid. He is on valsartan-HCTZ, since valsartan is on the recall list, I have switched this to Irbesartan (without HCTZ) instead. I will obtain a CBC and a metabolic panel to rule out any secondary causes. I also recommended a heart monitor as well. He says this is similar to what  he experienced before his previous TAA rupture, he is due for repeat CTA of the chest.   Past Medical History:  Diagnosis Date  . Allergic conjunctivitis   . Allergic rhinitis, cause unspecified    on allergy shots (Dr. Orvil Feil)  . Atherosclerosis of aorta (HCC)    noted on CT angio of abd/pelvis, in many vessels  . BPH (benign prostatic hypertrophy)   . Diverticulosis of colon 12/09  . Foot drop, right   . GERD (gastroesophageal reflux disease)   . Hearing loss in left ear    both ears now  . History of Clostridium difficile    multiple times 2016--s/p fecal transplant (Dr. Baxter Flattery).  Marland Kitchen Hx of echocardiogram    Echo (03/2014):  Mild LVH, EF 50-55%, no RWMA, Gr 1 DD, mild MR, mild reduced RVSF  . Hypertension   . Internal hemorrhoids   . Iron deficiency anemia, unspecified 6/09  . Mitral valve prolapse    h/o; normal echo 12/2011 with no MVP seen  . 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 Right ventricular dysfunction 01/23/2014   Secondary to obstruction of main pulmonary artery  . Ruptured thoracic aortic aneurysm ([place]) 01/23/2014  . Unspecified hypothyroidism     Past Surgical History:  Procedure Laterality Date  . CERVICAL LAMINECTOMY  1972   C5-6  . COLONOSCOPY WITH PROPOFOL N/A 11/09/2014   Procedure: COLONOSCOPY WITH PROPOFOL;  Surgeon: Jerene Bears, MD;  Location: [facility];  Service: Gastroenterology;  Laterality: N/A;  . ESOPHAGOGASTRODUODENOSCOPY  10/31/08   normal; Dr. Edison Nasuti  . FECAL  TRANSPLANT N/A 11/09/2014   Procedure: FECAL TRANSPLANT;  Surgeon: Jerene Bears, MD;  Location: [facility];  Service: Gastroenterology;  Laterality: N/A;  . HEMORRHOIDECTOMY WITH HEMORRHOID BANDING  04/07/13  . hemorroidal banding  04/07/2013   x3-Dr.Eric Redmond Pulling  . PROSTATE SURGERY  2007   photovaporization  . ROTATOR CUFF REPAIR  10/2008   left; Dr. Onnie Graham  . SPINE SURGERY  2006   L4-5 disk surgery  . SPINE SURGERY  04/2013   L4-5, L5-S1 fusion.  Dr. Christella Noa  . THORACIC  AORTIC ANEURYSM REPAIR N/A 01/23/2014   Procedure: THORACIC ASCENDING ANEURYSM REPAIR (AAA);  Surgeon: Rexene Alberts, MD;  Location: [facility];  Service: Open Heart Surgery;  Laterality: N/A;  . TONSILLECTOMY      Current Medications: Outpatient Medications Prior to Visit  Medication Sig Dispense Refill  . amLODipine (NORVASC) 2.5 MG tablet Take 1 tablet (2.5 mg total) by mouth daily. 90 tablet 3  . aspirin 81 MG tablet Take 81 mg by mouth at bedtime.     Marland Kitchen atorvastatin (LIPITOR) 10 MG tablet TAKE 1 TABLET BY MOUTH  DAILY 90 tablet 0  . Azelastine HCl 0.15 % SOLN     . EPIPEN 2-PAK 0.3 MG/0.3ML SOAJ injection Reported on 11/08/2015  1  . Ferrous Sulfate (IRON) 325 (65 FE) MG TABS Take 1 tablet by mouth every other day. Reported on 11/08/2015    . hydrALAZINE (APRESOLINE) 25 MG tablet Take 1 tablet (25 mg total) by mouth 2 (two) times daily. (Patient taking differently: Take 25 mg by mouth daily. ) 180 tablet 1  . ipratropium (ATROVENT) 0.03 % nasal spray Place 2 sprays into both nostrils daily.     . metoprolol tartrate (LOPRESSOR) 50 MG tablet TAKE 1 TABLET BY MOUTH TWO  TIMES DAILY 180 tablet 0  . omeprazole (PRILOSEC) 20 MG capsule TAKE 1 CAPSULE BY MOUTH  EVERY OTHER DAY 45 capsule 1  . SYNTHROID 50 MCG tablet TAKE 1 TABLET BY MOUTH  DAILY BEFORE BREAKFAST 90 tablet 3  . triamcinolone cream (KENALOG) 0.1 % Apply 1 application topically 2 (two) times daily. 454 g 0  . valsartan-hydrochlorothiazide (DIOVAN HCT) 160-12.5 MG tablet Take 1 tablet by mouth daily. 90 tablet 1   No facility-administered medications prior to visit.      Allergies:   Patient has no known allergies.   Social History   Social History  . Marital status: Married    Spouse name: N/A  . Number of children: 2  . Years of education: N/A   Social History Main Topics  . Smoking status: Former Smoker    Types: Cigarettes    Quit date: 09/01/1977  . Smokeless tobacco: Never Used  . Alcohol use Yes     Comment: 1-2  glasses wine per day  . Drug use: No  . Sexual activity: Not Asked   Other Topics Concern  . None   Social History Narrative   Lives with wife. 1 daughter in [place], 1 daughter in [place], [place].   5 grandchildren.     Family History:  The patient's family history includes Cancer in his brother and brother; Depression in his mother; Diabetes in his mother and sister; Emphysema in his brother; Heart disease in his mother; Hypertension in his father and mother; Parkinsonism in his father; Stroke in his father.   ROS:   Please see the history of present illness.    ROS All other systems reviewed and are negative.   PHYSICAL EXAM:  VS:  BP (!) 158/78   Pulse (!) 56   Ht 5\' 4"  (1.626 m)   Wt 145 lb (65.8 kg)   SpO2 98%   BMI 24.89 kg/m    GEN: Well nourished, well developed, in no acute distress  HEENT: normal  Neck: no JVD, carotid bruits, or masses Cardiac: RRR; no murmurs, rubs, or gallops,no edema  Respiratory:  clear to auscultation bilaterally, normal work of breathing GI: soft, nontender, nondistended, + BS MS: no deformity or atrophy  Skin: warm and dry, no rash Neuro:  Alert and Oriented x 3, Strength and sensation are intact Psych: euthymic mood, full affect  Wt Readings from Last 3 Encounters:  04/01/17 145 lb (65.8 kg)  11/20/16 145 lb 12.8 oz (66.1 kg)  10/22/16 146 lb 9.6 oz (66.5 kg)      Studies/Labs Reviewed:   EKG:  EKG is ordered today.  The ekg ordered today demonstrates  Normal sinus rhythm, T-wave inversion in inferolateral leads  Recent Labs: 10/22/2016: ALT 20; TSH 2.92 04/01/2017: BUN 19; Creatinine, Ser 1.17; Hemoglobin 15.4; Platelets 279; Potassium 5.0; Sodium 138   Lipid Panel    Component Value Date/Time   CHOL 149 04/08/2016 0913   TRIG 90 04/08/2016 0913   HDL 59 04/08/2016 0913   CHOLHDL 2.5 04/08/2016 0913   VLDL 18 04/08/2016 0913   LDLCALC 72 04/08/2016 0913    Additional studies/ records that were reviewed today  include:   Echo 03/02/2014  LV EF: 50% -  55%  Study Conclusions  - Left ventricle: The cavity size was normal. Wall thickness was increased in a pattern of mild LVH. Systolic function was normal. The estimated ejection fraction was in the range of 50% to 55%. Wall motion was normal; there were no regional wall motion abnormalities. Doppler parameters are consistent with abnormal left ventricular relaxation (grade 1 diastolic dysfunction). - Ventricular septum: Septal motion showed paradox. - Mitral valve: There was mild regurgitation. - Right ventricle: The cavity size was mildly dilated. Systolic function was mildly reduced.  Impressions:  - Normal LV function; paradoxical septal motion; no pericardial effusion; normal size aortic root.   ASSESSMENT:    1. Dizziness   2. Syncope, unspecified syncope type   3. History of repair of dissecting aneurysm of ascending thoracic aorta   4. Fatigue, unspecified type   5. Essential hypertension   6. Hypothyroidism, unspecified type      PLAN:  In order of problems listed above:  1. Dizziness: Does not occur with change in the body positions, does not appears to be orthostatic in nature. Seems to spontaneously recur and to spontaneously resolve, will obtain a 30 day event monitor. He also has similar symptom prior to the previous TAA rupture, he is due for CTA of the chest  2. TAA rupture s/p repair: Will obtain CTA of the chest  3. Hypothyroidism: Recent TSH normal in February 2018  4. Hypertension: Blood pressure mildly elevated today, hesitant to decreased blood pressure too much given recent recurrent dizziness. Due to valsartan recall, I have switched his valsartan-HCTZ to irbesartan    Medication Adjustments/Labs and Tests Ordered: Current medicines are reviewed at length with the patient today.  Concerns regarding medicines are outlined above.  Medication changes, Labs and Tests ordered today are listed in  the Patient Instructions below. Patient Instructions  Medication Instructions:   STOP diovan-HCTZ  START irbesartan 150mg  DAILY  Labwork:   Your physician recommends that you get lab work drawn today: CBC  and BMET  Testing/Procedures:  Your physician has recommended that you wear an event monitor. Event monitors are medical devices that record the heart's electrical activity. Doctors most often [place] these monitors to diagnose arrhythmias. Arrhythmias are problems with the speed or rhythm of the heartbeat. The monitor is a small, portable device. You can wear one while you do your normal daily activities. This is usually used to diagnose what is causing palpitations/syncope (passing out).   Your physician has requested that you have cardiac CT. Cardiac computed tomography (CT) is a painless test that uses an x-ray machine to take clear, detailed pictures of your heart. For further information please visit [facility]ugeFiesta[facility][email or web address]. Please follow instruction sheet as given.    Follow-Up:  2 months with Dr. Stanford Breed    If you need a refill on your cardiac medications before your next appointment, please call your pharmacy.      Hilbert Corrigan, [place]  04/02/2017 11:43 PM    [facility] [place], [place], [place]  [address] Phone: [phone]; Fax: [phone]

## 2017-04-02 ENCOUNTER — Encounter: Payer: Self-pay | Admitting: Physician Assistant

## 2017-04-02 LAB — BASIC METABOLIC PANEL
BUN/Creatinine Ratio: 16 (ref 10–24)
BUN: 19 mg/dL (ref 8–27)
CALCIUM: 10.4 mg/dL — AB (ref 8.6–10.2)
CO2: 25 mmol/L (ref 20–29)
CREATININE: 1.17 mg/dL (ref 0.76–1.27)
Chloride: 96 mmol/L (ref 96–106)
GFR, EST AFRICAN AMERICAN: 67 mL/min/{1.73_m2} (ref >59)
GFR, EST NON AFRICAN AMERICAN: 58 mL/min/{1.73_m2} — AB (ref >59)
Glucose: 81 mg/dL (ref 65–99)
Potassium: 5 mmol/L (ref 3.5–5.2)
Sodium: 138 mmol/L (ref 134–144)

## 2017-04-02 LAB — CBC
HEMATOCRIT: 44 % (ref 37.5–51.0)
HEMOGLOBIN: 15.4 g/dL (ref 13.0–17.7)
MCH: 28.5 pg (ref 26.6–33.0)
MCHC: 35 g/dL (ref 31.5–35.7)
MCV: 82 fL (ref 79–97)
Platelets: 279 10*3/uL (ref 150–379)
RBC: 5.4 x10E6/uL (ref 4.14–5.80)
RDW: 13.9 % (ref 12.3–15.4)
WBC: 6.7 10*3/uL (ref 3.4–10.8)

## 2017-04-07 ENCOUNTER — Encounter: Payer: Self-pay | Admitting: Physician Assistant

## 2017-04-14 ENCOUNTER — Ambulatory Visit (INDEPENDENT_AMBULATORY_CARE_PROVIDER_SITE_OTHER): Payer: Medicare Other

## 2017-04-14 DIAGNOSIS — R42 Dizziness and giddiness: Secondary | ICD-10-CM | POA: Diagnosis not present

## 2017-04-14 DIAGNOSIS — R55 Syncope and collapse: Secondary | ICD-10-CM | POA: Diagnosis not present

## 2017-04-15 ENCOUNTER — Ambulatory Visit
Admission: RE | Admit: 2017-04-15 | Discharge: 2017-04-15 | Disposition: A | Discharge status: Home / Self Care | Payer: Medicare Other | Source: Ambulatory Visit | Attending: Physician Assistant | Admitting: Physician Assistant

## 2017-04-15 DIAGNOSIS — I712 Thoracic aortic aneurysm, without rupture: Secondary | ICD-10-CM | POA: Diagnosis not present

## 2017-04-15 MED ORDER — IOPAMIDOL (ISOVUE-370) INJECTION 76%
75.0000 mL | Freq: Once | INTRAVENOUS | Status: AC | PRN
  Administered 2017-04-15: 75 mL via INTRAVENOUS

## 2017-04-21 NOTE — Progress Notes (Signed)
Chief Complaint  Patient presents with  . Hypertension    nonfasting med check-brought in list of bp readings.     Hypertension: BP's continue to have wide fluctuations, recently ranging 111-170/69-92 (average of last 21 tests 143/82.) He denies headaches, chest pain, DOE. He last saw cardiologist 8/1, and was reporting dizziness x 2 weeks (with normal BP's).  Since he was on recalled Valsartan HCTZ, this was switched to Irbesartan (without HCTZ, due to dizziness, ?if related to volume depletion) instead. CBC and c-met were checked, and heart monitor (in process).  He mentioned to cardiologist that this is similar to what he experienced before his previous TAA rupture. Repeat CTA of the chest was just done 8/15, results below.  He is scheduled to see Dr. Stanford Breed in 2 months. Denies any edema since medication change.  IMPRESSION: Status post surgical repair of ascending thoracic aorta which is stable compared to prior exam. There is no evidence of thoracic aortic dissection or aneurysm. Coronary artery calcifications are noted suggesting coronary disease. Stable calcified mediastinal adenopathy is noted consistent with prior granulomatous disease. Stable peripheral tubular densities are noted in both lung bases with associated honeycombing suggesting pulmonary fibrosis. Aortic Atherosclerosis (ICD10-I70.0).  Lab Results  Component Value Date   WBC 6.7 04/01/2017   HGB 15.4 04/01/2017   HCT 44.0 04/01/2017   MCV 82 04/01/2017   PLT 279 04/01/2017     Chemistry      Component Value Date/Time   NA 138 04/01/2017 1216   K 5.0 04/01/2017 1216   CL 96 04/01/2017 1216   CO2 25 04/01/2017 1216   BUN 19 04/01/2017 1216   CREATININE 1.17 04/01/2017 1216   CREATININE 1.26 (H) 10/22/2016 1437      Component Value Date/Time   CALCIUM 10.4 (H) 04/01/2017 1216   ALKPHOS 81 10/22/2016 1437   AST 27 10/22/2016 1437   ALT 20 10/22/2016 1437   BILITOT 0.7 10/22/2016 1437       Hyperlipidemia--tolerating atorvastatin, no longer having any leg cramps. Lab Results  Component Value Date   CHOL 149 04/08/2016   HDL 59 04/08/2016   LDLCALC 72 04/08/2016   TRIG 90 04/08/2016   CHOLHDL 2.5 04/08/2016    GERD: He continues to take omeprazole every other day with good results. He has h/o iron deficiency, and he continues to take iron every other day on the days he doesn't take the omeprazole. He has had recurrence of reflux when PPI was stopped, and recurrence of anemia when iron was stopped in the past. Denies dysphagia.  Hypothyroidism: Denies any symptoms--no changes in weight, moods, hair/skin.  Bowels changed since having fecal transplant, but has been stable (slighty softer stools). Some decrease in energy, just feels like he is "slowing down" some, possibly related to being 83.  He walks 30 minutes rather than an hour, and only goes 2x/week rather than 3. Takes medication on an empty stomach, separate from other medications. 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 He hasn't had any problems. Recent CTA as above, stable.  Last saw Dr. Ricard Dillon 03/2016, and said to f/u in 2 years.  Low Vitamin D level, noted to be 26 in February.  He was advised to start taking a daily supplement (D3 vs MVI).  He is  currently taking 2000 IU of vitamin D3 daily, and has noticed his nails growing.  H/o depression--on and off citalopram.  "I'm fine", no longer having any problems. Wife's anxiety is much better (on treatment). Less arguing with her now, and overall moods are very good.  He complains of some ongoing hip pain, bilaterally, notices with stairs.  Ortho said it wasn't his joint. Previously told muscular by me, and reports that he was  sent to PT; admits he "gave up" on it, didn't give it a good try. He denies any radiation below the lateral hip. Denies numbness, tingling, weakness.  No new back pain   PMH, PSH, SH reviewed  Outpatient Encounter Prescriptions as of 04/22/2017  Medication Sig Note  . aspirin 81 MG tablet Take 81 mg by mouth at bedtime.    Marland Kitchen atorvastatin (LIPITOR) 10 MG tablet TAKE 1 TABLET BY MOUTH  DAILY   . Azelastine HCl 0.15 % SOLN    . Cholecalciferol (VITAMIN D3) 2000 units TABS Take 1 tablet by mouth daily.   . Ferrous Sulfate (IRON) 325 (65 FE) MG TABS Take 1 tablet by mouth every other day. Reported on 11/08/2015   . hydrALAZINE (APRESOLINE) 25 MG tablet Take 1 tablet (25 mg total) by mouth 2 (two) times daily. (Patient taking differently: Take 25 mg by mouth daily. ) 10/22/2016: Taking one daily  . ipratropium (ATROVENT) 0.03 % nasal spray Place 2 sprays into both nostrils daily.    . irbesartan (AVAPRO) 150 MG tablet Take 1 tablet (150 mg total) by mouth daily.   . metoprolol tartrate (LOPRESSOR) 50 MG tablet TAKE 1 TABLET BY MOUTH TWO  TIMES DAILY   . omeprazole (PRILOSEC) 20 MG capsule TAKE 1 CAPSULE BY MOUTH  EVERY OTHER DAY   . SYNTHROID 50 MCG tablet TAKE 1 TABLET BY MOUTH  DAILY BEFORE BREAKFAST   . triamcinolone cream (KENALOG) 0.1 % Apply 1 application topically 2 (two) times daily.   Marland Kitchen amLODipine (NORVASC) 2.5 MG tablet Take 1 tablet (2.5 mg total) by mouth daily.   . [DISCONTINUED] EPIPEN 2-PAK 0.3 MG/0.3ML SOAJ injection Reported on 11/08/2015   . [DISCONTINUED] valsartan-hydrochlorothiazide (DIOVAN HCT) 160-12.5 MG tablet Take 1 tablet by mouth daily.    No facility-administered encounter medications on file as of 04/22/2017.    No Known Allergies  ROS: no fever, chills, significant weight changes, headaches, dizziness (none recent), chest pain, palpitations, shortness of breath. Denies URI or allergy symptoms, doing well since stopping allergy shots.  +mild fatigue.  No bleeding,  bruising, blood in stools, urinary complaints, rash.  +hip pain, as per HPI.  Moods are good.   PHYSICAL EXAM:  BP (!) 148/88 (BP Location: Right Arm, Cuff Size: Normal)   Pulse 60   Ht 5' 4"  (1.626 m)   Wt 146 lb 9.6 oz (66.5 kg)   BMI 25.16 kg/m   150/90 on repeat by MD  Wt Readings from Last 3 Encounters:  04/22/17 146 lb 9.6 oz (66.5 kg)  04/01/17 145 lb (65.8 kg)  11/20/16 145 lb 12.8 oz (66.1 kg)   Well developed, pleasant, elderly male in no distress HEENT: conjunctiva and sclera are clear, EOMI; OP is clear, moist mucus membranes.  Neck: no lymphadenopathy, thyromegaly or carotid bruit Heart: regular rate and rhythm Lungs: clear bilaterally Back: no spinal or CVA tenderness, no muscle spasm Chest: nontender, no mass.  Leads in place from heart monitor Abdomen: soft, nontender, no organomegaly or mass Extremities:no edema, normal pulses. FROM hip without  pain. Discomfort in pyriformis and part of gluteal muscles on palpitation and with stretches. Mild discomfort (proximally) with IT band stretch. nontender over trochanteric bursa. Psych: normal mood, affect, hygiene and grooming Neuro: alert and oriented, cranial nerves intact, normal strength, gait.   ASSESSMENT/PLAN:  Pure hypercholesterolemia - Plan: Lipid panel, Hepatic function panel  Essential hypertension, benign - borderline. Recent med changes made per cardiologist--to let them know if remains persistently higher than prior to change  Atherosclerosis of aorta (HCC) - continue statin, aspirin  Hypothyroidism, unspecified type - some fatigue, so will recheck TSH - Plan: TSH  Medication monitoring encounter - Plan: Lipid panel, Hepatic function panel, TSH  Pain of both hip joints - muscular, related to pyriformis and gluteus muscles.  Stretches shown, can massage with tennis ball. Consider PT if not improving. ITB stretches shown   Lipid, LFT, TSH  (CBC and b-met recently done)  Return for fasting labs  (nonfasting today)    Continue to monitor your blood pressure at home.  Since you recently had a change in your medication by the cardiologist (eliminating the diuretic), please be sure to contact them if you notice the blood pressures progressively getting higher.  Today it was a little high, but not too different from what it was prior. You tend to have a lot of fluctuations in your blood pressure..  Contact them if your blood pressures are consistently >140-150 without any of the lower values you have been also seeing.

## 2017-04-22 ENCOUNTER — Encounter: Payer: Self-pay | Admitting: Cardiology

## 2017-04-22 ENCOUNTER — Encounter: Payer: Self-pay | Admitting: Family Medicine

## 2017-04-22 ENCOUNTER — Ambulatory Visit (INDEPENDENT_AMBULATORY_CARE_PROVIDER_SITE_OTHER): Payer: Medicare Other | Admitting: Family Medicine

## 2017-04-22 VITALS — BP 148/88 | HR 60 | Ht 64.0 in | Wt 146.6 lb

## 2017-04-22 DIAGNOSIS — I700 Atherosclerosis of aorta: Secondary | ICD-10-CM | POA: Diagnosis not present

## 2017-04-22 DIAGNOSIS — M25552 Pain in left hip: Secondary | ICD-10-CM

## 2017-04-22 DIAGNOSIS — Z5181 Encounter for therapeutic drug level monitoring: Secondary | ICD-10-CM

## 2017-04-22 DIAGNOSIS — I10 Essential (primary) hypertension: Secondary | ICD-10-CM | POA: Diagnosis not present

## 2017-04-22 DIAGNOSIS — E039 Hypothyroidism, unspecified: Secondary | ICD-10-CM | POA: Diagnosis not present

## 2017-04-22 DIAGNOSIS — E7800 Pure hypercholesterolemia, unspecified: Secondary | ICD-10-CM | POA: Diagnosis not present

## 2017-04-22 DIAGNOSIS — M25551 Pain in right hip: Secondary | ICD-10-CM

## 2017-04-22 NOTE — Patient Instructions (Addendum)
  I recommend getting the new shingles vaccine (Shingrix). You will need to check with your insurance to see if it is covered, and if covered by Medicare Part D, you need to get from the pharmacy rather than our office.  It is a series of 2 injections, spaced 2 months apart. At this time there is a Teacher, music.  I would wait until you see commercials on TV or until 2019 to start the series.  Continue to monitor your blood pressure at home.  Since you recently had a change in your medication by the cardiologist (eliminating the diuretic), please be sure to contact them if you notice the blood pressures progressively getting higher.  Today it was a little high, but not too different from what it was prior. You tend to have a lot of fluctuations in your blood pressure..  Contact them if your blood pressures are consistently >140-150 without any of the lower values you have been also seeing.  Do the stretches for your hips and pyriformis muscles as shown at least 3 times/day. Cross one leg over the other and pull your legs back (laying on your back, as we demonstrated in the office). Also do the IT band stretches off the side of the bed.  If ultimately this hip pain isn't improving, you can consider re-trying physical therapy. Massage with a tennis ball over the painful area (not excessively) can also be helpful.

## 2017-04-23 ENCOUNTER — Encounter: Payer: Self-pay | Admitting: Family Medicine

## 2017-04-28 ENCOUNTER — Other Ambulatory Visit (INDEPENDENT_AMBULATORY_CARE_PROVIDER_SITE_OTHER): Payer: Medicare Other

## 2017-04-28 DIAGNOSIS — Z5181 Encounter for therapeutic drug level monitoring: Secondary | ICD-10-CM | POA: Diagnosis not present

## 2017-04-28 DIAGNOSIS — E039 Hypothyroidism, unspecified: Secondary | ICD-10-CM | POA: Diagnosis not present

## 2017-04-28 DIAGNOSIS — Z23 Encounter for immunization: Secondary | ICD-10-CM

## 2017-04-28 DIAGNOSIS — E7800 Pure hypercholesterolemia, unspecified: Secondary | ICD-10-CM | POA: Diagnosis not present

## 2017-04-29 ENCOUNTER — Encounter: Payer: Self-pay | Admitting: *Deleted

## 2017-04-29 ENCOUNTER — Telehealth: Payer: Self-pay | Admitting: *Deleted

## 2017-04-29 LAB — HEPATIC FUNCTION PANEL
ALK PHOS: 66 U/L (ref 40–115)
ALT: 16 U/L (ref 9–46)
AST: 25 U/L (ref 10–35)
Albumin: 4.1 g/dL (ref 3.6–5.1)
BILIRUBIN DIRECT: 0.2 mg/dL (ref <=0.2)
BILIRUBIN INDIRECT: 0.6 mg/dL (ref 0.2–1.2)
TOTAL PROTEIN: 7.6 g/dL (ref 6.1–8.1)
Total Bilirubin: 0.8 mg/dL (ref 0.2–1.2)

## 2017-04-29 LAB — LIPID PANEL
CHOLESTEROL: 156 mg/dL (ref <200)
HDL: 67 mg/dL (ref >40)
LDL CALC: 71 mg/dL (ref <100)
TRIGLYCERIDES: 90 mg/dL (ref <150)
Total CHOL/HDL Ratio: 2.3 Ratio (ref <5.0)
VLDL: 18 mg/dL (ref <30)

## 2017-04-29 LAB — TSH: TSH: 2.52 mIU/L (ref 0.40–4.50)

## 2017-04-29 NOTE — Telephone Encounter (Signed)
Paperwork and letter for patient to get a refund on planned trip complete. Unable to reach pt or leave a message, message to patient through my chart that paperwrok placed at the front desk for him to pick up.

## 2017-06-03 ENCOUNTER — Other Ambulatory Visit: Payer: Self-pay | Admitting: Family Medicine

## 2017-06-10 ENCOUNTER — Other Ambulatory Visit: Payer: Self-pay | Admitting: Family Medicine

## 2017-06-18 NOTE — Progress Notes (Signed)
HPI: FU TAA repair; also with micturition syncope, HTN. Admitted 12/2013 with a contained, ruptured ascending thoracic aortic aneurysm in the setting of streptococcal pneumoniae bacteremia/aortitis. He underwent repair of the ascending thoracic aortic aneurysm with a 32 mm Hemashield straight graft. He was also followed by infectious disease and treated with antibiotics. The patient was noted to have clot in the main pulmonary artery. This was likely related to compression of the pulmonary artery by the false aneurysm. He was treated with Coumadin. Echocardiogram 7/15 showed normal LV function, mild MR and mild RVE. Monitor August 2018 showed sinus bradycardia to sinus rhythm with PACs and PVCs. CTA August 2018 showed prior surgical repair of ascending thoracic aorta, coronary artery calcification and pulmonary fibrosis. Since last seen, patient denies dyspnea, chest pain, palpitations or syncope. He was having some dizziness over the summer but this has resolved.  Current Outpatient Prescriptions  Medication Sig Dispense Refill  . amLODipine (NORVASC) 2.5 MG tablet Take 1 tablet (2.5 mg total) by mouth daily. 90 tablet 3  . aspirin 81 MG tablet Take 81 mg by mouth at bedtime.     Marland Kitchen atorvastatin (LIPITOR) 10 MG tablet TAKE 1 TABLET BY MOUTH  DAILY 90 tablet 0  . Azelastine HCl 0.15 % SOLN     . Cholecalciferol (VITAMIN D3) 2000 units TABS Take 1 tablet by mouth daily.    . Ferrous Sulfate (IRON) 325 (65 FE) MG TABS Take 1 tablet by mouth every other day. Reported on 11/08/2015    . hydrALAZINE (APRESOLINE) 25 MG tablet Take 1 tablet (25 mg total) by mouth 2 (two) times daily. (Patient taking differently: Take 25 mg by mouth daily. ) 180 tablet 1  . ipratropium (ATROVENT) 0.03 % nasal spray Place 2 sprays into both nostrils daily.     . irbesartan (AVAPRO) 150 MG tablet Take 1 tablet (150 mg total) by mouth daily. 90 tablet 1  . metoprolol tartrate (LOPRESSOR) 50 MG tablet TAKE 1 TABLET BY MOUTH  TWO  TIMES DAILY 180 tablet 0  . omeprazole (PRILOSEC) 20 MG capsule TAKE 1 CAPSULE BY MOUTH  EVERY OTHER DAY 45 capsule 1  . SYNTHROID 50 MCG tablet TAKE 1 TABLET BY MOUTH  DAILY BEFORE BREAKFAST 90 tablet 3  . triamcinolone cream (KENALOG) 0.1 % Apply 1 application topically 2 (two) times daily. 454 g 0   No current facility-administered medications for this visit.      Past Medical History:  Diagnosis Date  . Allergic conjunctivitis   . Allergic rhinitis, cause unspecified    on allergy shots (Dr. Orvil Feil)  . Atherosclerosis of aorta (HCC)    noted on CT angio of abd/pelvis, in many vessels  . BPH (benign prostatic hypertrophy)   . Diverticulosis of colon 12/09  . Foot drop, right   . GERD (gastroesophageal reflux disease)   . Hearing loss in left ear    both ears now  . History of Clostridium difficile    multiple times 2016--s/p fecal transplant (Dr. Baxter Flattery).  Marland Kitchen Hx of echocardiogram    Echo (03/2014):  Mild LVH, EF 50-55%, no RWMA, Gr 1 DD, mild MR, mild reduced RVSF  . Hypertension   . Internal hemorrhoids   . Iron deficiency anemia, unspecified 6/09  . Mitral valve prolapse    h/o; normal echo 12/2011 with no MVP seen  . 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 Right ventricular dysfunction 01/23/2014   Secondary to obstruction of main pulmonary artery  .  Ruptured thoracic aortic aneurysm ([place]) 01/23/2014  . Unspecified hypothyroidism     Past Surgical History:  Procedure Laterality Date  . CERVICAL LAMINECTOMY  1972   C5-6  . COLONOSCOPY WITH PROPOFOL N/A 11/09/2014   Procedure: COLONOSCOPY WITH PROPOFOL;  Surgeon: Jerene Bears, MD;  Location: [facility];  Service: Gastroenterology;  Laterality: N/A;  . ESOPHAGOGASTRODUODENOSCOPY  10/31/08   normal; Dr. Edison Nasuti  . FECAL TRANSPLANT N/A 11/09/2014   Procedure: FECAL TRANSPLANT;  Surgeon: Jerene Bears, MD;  Location: [facility];  Service: Gastroenterology;  Laterality: N/A;  . HEMORRHOIDECTOMY WITH HEMORRHOID  BANDING  04/07/13  . hemorroidal banding  04/07/2013   x3-Dr.Eric Redmond Pulling  . PROSTATE SURGERY  2007   photovaporization  . ROTATOR CUFF REPAIR  10/2008   left; Dr. Onnie Graham  . SPINE SURGERY  2006   L4-5 disk surgery  . SPINE SURGERY  04/2013   L4-5, L5-S1 fusion.  Dr. Christella Noa  . THORACIC AORTIC ANEURYSM REPAIR N/A 01/23/2014   Procedure: THORACIC ASCENDING ANEURYSM REPAIR (AAA);  Surgeon: Rexene Alberts, MD;  Location: [place];  Service: Open Heart Surgery;  Laterality: N/A;  . TONSILLECTOMY      Social History   Social History  . Marital status: Married    Spouse name: N/A  . Number of children: 2  . Years of education: N/A   Occupational History  . Not on file.   Social History Main Topics  . Smoking status: Former Smoker    Types: Cigarettes    Quit date: 09/01/1977  . Smokeless tobacco: Never Used  . Alcohol use Yes     Comment: 1-2 glasses wine per day  . Drug use: No  . Sexual activity: Not on file   Other Topics Concern  . Not on file   Social History Narrative   Lives with wife. 1 daughter in [place], 1 daughter in [place], [place].   5 grandchildren.    Family History  Problem Relation Age of Onset  . Diabetes Mother   . Heart disease Mother        CHF  . Hypertension Mother   . Depression Mother   . Stroke Father   . Parkinsonism Father   . Hypertension Father   . Cancer Brother        bladder cancer  . Cancer Brother        prostate cancer  . Emphysema Brother   . Diabetes Sister   . Colon cancer Neg Hx     ROS: no fevers or chills, productive cough, hemoptysis, dysphasia, odynophagia, melena, hematochezia, dysuria, hematuria, rash, seizure activity, orthopnea, PND, pedal edema, claudication. Remaining systems are negative.  Physical Exam: Well-developed well-nourished in no acute distress.  Skin is warm and dry.  HEENT is normal.  Neck is supple.  Chest is clear to auscultation with normal expansion.  Cardiovascular exam is regular rate and  rhythm.  Abdominal exam nontender or distended. No masses palpated. Extremities show no edema. neuro grossly intact   A/P  1 Hypertension-blood pressure is elevated. He is on minimal dose of hydralazine which will be discontinued. Increase amlodipine to 5 mg daily. Follow blood pressure at home and advance regimen as needed.  2 prior thoracic aortic aneurysm repair-most recent CTA showed stable repair.  3 hyperlipidemia-continue statin.  4 history of pulmonary artery thrombus-patient was treated with anticoagulation and follow-up CT showed resolution.  Kirk Ruths, MD

## 2017-06-23 ENCOUNTER — Ambulatory Visit (INDEPENDENT_AMBULATORY_CARE_PROVIDER_SITE_OTHER): Payer: Medicare Other | Admitting: Cardiology

## 2017-06-23 ENCOUNTER — Encounter: Payer: Self-pay | Admitting: Cardiology

## 2017-06-23 ENCOUNTER — Telehealth: Payer: Self-pay | Admitting: Cardiology

## 2017-06-23 VITALS — BP 168/86 | HR 68 | Ht 64.0 in | Wt 145.0 lb

## 2017-06-23 DIAGNOSIS — E7800 Pure hypercholesterolemia, unspecified: Secondary | ICD-10-CM | POA: Diagnosis not present

## 2017-06-23 DIAGNOSIS — I10 Essential (primary) hypertension: Secondary | ICD-10-CM

## 2017-06-23 DIAGNOSIS — Z8679 Personal history of other diseases of the circulatory system: Secondary | ICD-10-CM | POA: Diagnosis not present

## 2017-06-23 DIAGNOSIS — Z9889 Other specified postprocedural states: Secondary | ICD-10-CM | POA: Diagnosis not present

## 2017-06-23 MED ORDER — AMLODIPINE BESYLATE 5 MG PO TABS: 5.0000 mg | ORAL_TABLET | Freq: Every day | ORAL | 3 refills | Status: DC

## 2017-06-23 NOTE — Telephone Encounter (Signed)
I spoke w patient. He was seen earlier today in clinic by Dr. Stanford Breed.  BP elevated at visit. He was given instructions to discontinue his hydralazine and increase his amlodipine dose to 5mg  daily.  He got home and verified list, as well as bottles of medications. He's not aware of having hydralazine/apresoline in his pillbox or on medication shelf. He's not sure when this medication was added to his list originally, but notes he hasn't been taking it.  Advised to continue w plan to increase amlodipine, keep BP diary, call in a week or two w his BP trends.  Informed him I'd seek any further recommendations from Dr. Stanford Breed and call him back. Pt verbalized understanding and thanks.

## 2017-06-23 NOTE — Telephone Encounter (Signed)
Line busy when dialed. 

## 2017-06-23 NOTE — Patient Instructions (Signed)
Medication Instructions:   STOP HYDRALAZINE  INCREASE AMLODIPINE TO 5 MG ONCE DAILY= 2 OF THE 2.5 MG TABLETS ONCE DAILY  Follow-Up:  Your physician wants you to follow-up in: [place] will receive a reminder letter in the mail two months in advance. If you don't receive a letter, please call our office to schedule the follow-up appointment.   If you need a refill on your cardiac medications before your next appointment, please call your pharmacy.

## 2017-06-23 NOTE — Telephone Encounter (Signed)
S/w patient and communicated recommendations, understanding verbalized.

## 2017-06-23 NOTE — Telephone Encounter (Signed)
New message   Patient calling to get clarification for Hydralazine. Patient states   Please call.  Pt c/o medication issue:  1. Name of Medication: Hydralazine  2. How are you currently taking this medication (dosage and times per day)? n/a  3. Are you having a reaction (difficulty breathing--STAT)? n/a  4. What is your medication issue? He was told to stop medication, but he does not think he is taking this medication.

## 2017-06-23 NOTE — Telephone Encounter (Signed)
Agree with plan Brian Crenshaw  

## 2017-06-25 ENCOUNTER — Encounter: Payer: Self-pay | Admitting: Podiatry

## 2017-06-25 ENCOUNTER — Ambulatory Visit (INDEPENDENT_AMBULATORY_CARE_PROVIDER_SITE_OTHER): Payer: Medicare Other | Admitting: Podiatry

## 2017-06-25 DIAGNOSIS — M79674 Pain in right toe(s): Secondary | ICD-10-CM | POA: Diagnosis not present

## 2017-06-25 DIAGNOSIS — B351 Tinea unguium: Secondary | ICD-10-CM

## 2017-06-25 DIAGNOSIS — M79675 Pain in left toe(s): Secondary | ICD-10-CM | POA: Diagnosis not present

## 2017-06-25 DIAGNOSIS — Q828 Other specified congenital malformations of skin: Secondary | ICD-10-CM | POA: Diagnosis not present

## 2017-06-25 NOTE — Progress Notes (Signed)
Subjective:    Patient ID: Danny Frederick, male    DOB: 1934-06-09, 82 y.o.   MRN: [id number]  HPI  83 year old male presents the office today for concerns of thick, painful, elongated toenails he cannot trim himself and they get irritated inside shoes. He has has calluses to both of his feet which again didn't painful with pressure. Denies any recent injury or trauma. No swelling or redness. He has no other concerns today. Review of Systems  All other systems reviewed and are negative.  Past Medical History:  Diagnosis Date  . Allergic conjunctivitis   . Allergic rhinitis, cause unspecified    on allergy shots (Dr. Orvil Feil)  . Atherosclerosis of aorta (HCC)    noted on CT angio of abd/pelvis, in many vessels  . BPH (benign prostatic hypertrophy)   . Diverticulosis of colon 12/09  . Foot drop, right   . GERD (gastroesophageal reflux disease)   . Hearing loss in left ear    both ears now  . History of Clostridium difficile    multiple times 2016--s/p fecal transplant (Dr. Baxter Flattery).  Marland Kitchen Hx of echocardiogram    Echo (03/2014):  Mild LVH, EF 50-55%, no RWMA, Gr 1 DD, mild MR, mild reduced RVSF  . Hypertension   . Internal hemorrhoids   . Iron deficiency anemia, unspecified 6/09  . Mitral valve prolapse    h/o; normal echo 12/2011 with no MVP seen  . 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 Right ventricular dysfunction 01/23/2014   Secondary to obstruction of main pulmonary artery  . Ruptured thoracic aortic aneurysm ([place]) 01/23/2014  . Unspecified hypothyroidism     Past Surgical History:  Procedure Laterality Date  . CERVICAL LAMINECTOMY  1972   C5-6  . COLONOSCOPY WITH PROPOFOL N/A 11/09/2014   Procedure: COLONOSCOPY WITH PROPOFOL;  Surgeon: Jerene Bears, MD;  Location: [facility];  Service: Gastroenterology;  Laterality: N/A;  . ESOPHAGOGASTRODUODENOSCOPY  10/31/08   normal; Dr. Edison Nasuti  . FECAL TRANSPLANT N/A 11/09/2014   Procedure: FECAL TRANSPLANT;  Surgeon: Jerene Bears, MD;  Location: [facility];  Service: Gastroenterology;  Laterality: N/A;  . HEMORRHOIDECTOMY WITH HEMORRHOID BANDING  04/07/13  . hemorroidal banding  04/07/2013   x3-Dr.Eric Redmond Pulling  . PROSTATE SURGERY  2007   photovaporization  . ROTATOR CUFF REPAIR  10/2008   left; Dr. Onnie Graham  . SPINE SURGERY  2006   L4-5 disk surgery  . SPINE SURGERY  04/2013   L4-5, L5-S1 fusion.  Dr. Christella Noa  . THORACIC AORTIC ANEURYSM REPAIR N/A 01/23/2014   Procedure: THORACIC ASCENDING ANEURYSM REPAIR ([facility]);  Surgeon: Rexene Alberts, MD;  Location: [facility];  Service: Open Heart Surgery;  Laterality: N/A;  . TONSILLECTOMY       Current Outpatient Prescriptions:  .  amLODipine (NORVASC) 5 MG tablet, Take 1 tablet (5 mg total) by mouth daily., Disp: 90 tablet, Rfl: 3 .  aspirin 81 MG tablet, Take 81 mg by mouth at bedtime. , Disp: , Rfl:  .  atorvastatin (LIPITOR) 10 MG tablet, TAKE 1 TABLET BY MOUTH  DAILY, Disp: 90 tablet, Rfl: 0 .  Azelastine HCl 0.15 % SOLN, , Disp: , Rfl:  .  Cholecalciferol (VITAMIN D3) 2000 units TABS, Take 1 tablet by mouth daily., Disp: , Rfl:  .  Ferrous Sulfate (IRON) 325 (65 FE) MG TABS, Take 1 tablet by mouth every other day. Reported on 11/08/2015, Disp: , Rfl:  .  ipratropium (ATROVENT) 0.03 % nasal spray, Place 2  sprays into both nostrils daily. , Disp: , Rfl:  .  irbesartan (AVAPRO) 150 MG tablet, Take 1 tablet (150 mg total) by mouth daily., Disp: 90 tablet, Rfl: 1 .  metoprolol tartrate (LOPRESSOR) 50 MG tablet, TAKE 1 TABLET BY MOUTH TWO  TIMES DAILY, Disp: 180 tablet, Rfl: 0 .  omeprazole (PRILOSEC) 20 MG capsule, TAKE 1 CAPSULE BY MOUTH  EVERY OTHER DAY, Disp: 45 capsule, Rfl: 1 .  SYNTHROID 50 MCG tablet, TAKE 1 TABLET BY MOUTH  DAILY BEFORE BREAKFAST, Disp: 90 tablet, Rfl: 3 .  triamcinolone cream (KENALOG) 0.1 %, Apply 1 application topically 2 (two) times daily., Disp: 454 g, Rfl: 0  No Known Allergies  Social History   Social History  . Marital status: Married     Spouse name: N/A  . Number of children: 2  . Years of education: N/A   Occupational History  . Not on file.   Social History Main Topics  . Smoking status: Former Smoker    Types: Cigarettes    Quit date: 09/01/1977  . Smokeless tobacco: Never Used  . Alcohol use Yes     Comment: 1-2 glasses wine per day  . Drug use: No  . Sexual activity: Not on file   Other Topics Concern  . Not on file   Social History Narrative   Lives with wife. 1 daughter in [place], 1 daughter in [place], [place].   5 grandchildren.       Objective:   Physical Exam  General: AAO x3, NAD  Dermatological: Nails are hypertrophic, dystrophic, brittle, discolored, elongated 10. No surrounding redness or drainage. Tenderness nails 1-5 bilaterally. Hyperkeratotic lesions left submetatarsal 4 and right submetatarsal 3. Upon debridement there is no underlying ulceration, drainage or any clinical signs of infection present today. No open lesions or pre-ulcerative lesions are identified today.  Vascular: Dorsalis Pedis artery and Posterior Tibial artery pedal pulses are 2/4 bilateral with immedate capillary fill time. There is no pain with calf compression, swelling, warmth, erythema.   Neruologic: Grossly intact via light touch bilateral. Protective threshold with Semmes Wienstein monofilament intact to all pedal sites bilateral.   Musculoskeletal: No gross boney pedal deformities bilateral. No pain, crepitus, or limitation noted with foot and ankle range of motion bilateral. Muscular strength 5/5 in all groups tested bilateral.  Gait: Unassisted, Nonantalgic.     Assessment & Plan:  83 year old male with symptomatic onychomycosis, porokeratosis -Treatment options discussed including all alternatives, risks, and complications -Etiology of symptoms were discussed -Nails debrided 10 without complications or bleeding. -Hyperkeratotic lesions were sharply debrided 2 without any complications or  bleeding. -Daily foot inspection -Follow-up in 3 months or sooner if any problems arise. In the meantime, encouraged to call the office with any questions, concerns, change in symptoms.   Celesta Gentile, DPM

## 2017-06-30 DIAGNOSIS — H25813 Combined forms of age-related cataract, bilateral: Secondary | ICD-10-CM | POA: Diagnosis not present

## 2017-06-30 DIAGNOSIS — H52223 Regular astigmatism, bilateral: Secondary | ICD-10-CM | POA: Diagnosis not present

## 2017-06-30 DIAGNOSIS — H04123 Dry eye syndrome of bilateral lacrimal glands: Secondary | ICD-10-CM | POA: Diagnosis not present

## 2017-06-30 DIAGNOSIS — H5203 Hypermetropia, bilateral: Secondary | ICD-10-CM | POA: Diagnosis not present

## 2017-06-30 DIAGNOSIS — H524 Presbyopia: Secondary | ICD-10-CM | POA: Diagnosis not present

## 2017-07-01 ENCOUNTER — Other Ambulatory Visit: Payer: Self-pay | Admitting: Family Medicine

## 2017-07-01 NOTE — Telephone Encounter (Signed)
Is this ok to refill?  

## 2017-07-07 ENCOUNTER — Encounter: Payer: Self-pay | Admitting: Physician Assistant

## 2017-07-09 DIAGNOSIS — J3081 Allergic rhinitis due to animal (cat) (dog) hair and dander: Secondary | ICD-10-CM | POA: Diagnosis not present

## 2017-07-09 DIAGNOSIS — J3089 Other allergic rhinitis: Secondary | ICD-10-CM | POA: Diagnosis not present

## 2017-07-09 DIAGNOSIS — J301 Allergic rhinitis due to pollen: Secondary | ICD-10-CM | POA: Diagnosis not present

## 2017-07-09 DIAGNOSIS — H1045 Other chronic allergic conjunctivitis: Secondary | ICD-10-CM | POA: Diagnosis not present

## 2017-08-03 DIAGNOSIS — M48061 Spinal stenosis, lumbar region without neurogenic claudication: Secondary | ICD-10-CM | POA: Diagnosis not present

## 2017-08-05 ENCOUNTER — Other Ambulatory Visit (HOSPITAL_COMMUNITY): Payer: Self-pay | Admitting: Neurosurgery

## 2017-08-05 ENCOUNTER — Other Ambulatory Visit: Payer: Self-pay | Admitting: Neurosurgery

## 2017-08-05 DIAGNOSIS — M48061 Spinal stenosis, lumbar region without neurogenic claudication: Secondary | ICD-10-CM

## 2017-08-21 ENCOUNTER — Ambulatory Visit (HOSPITAL_COMMUNITY)
Admission: RE | Admit: 2017-08-21 | Discharge: 2017-08-21 | Disposition: A | Discharge status: Home / Self Care | Payer: Medicare Other | Source: Ambulatory Visit | Attending: Neurosurgery | Admitting: Neurosurgery

## 2017-08-21 DIAGNOSIS — M48061 Spinal stenosis, lumbar region without neurogenic claudication: Secondary | ICD-10-CM | POA: Diagnosis not present

## 2017-08-21 DIAGNOSIS — M2578 Osteophyte, vertebrae: Secondary | ICD-10-CM | POA: Diagnosis not present

## 2017-08-21 DIAGNOSIS — Z981 Arthrodesis status: Secondary | ICD-10-CM | POA: Insufficient documentation

## 2017-08-21 DIAGNOSIS — M4805 Spinal stenosis, thoracolumbar region: Secondary | ICD-10-CM | POA: Diagnosis not present

## 2017-08-21 DIAGNOSIS — M5136 Other intervertebral disc degeneration, lumbar region: Secondary | ICD-10-CM | POA: Diagnosis not present

## 2017-08-21 DIAGNOSIS — M48062 Spinal stenosis, lumbar region with neurogenic claudication: Secondary | ICD-10-CM | POA: Diagnosis not present

## 2017-08-21 DIAGNOSIS — I700 Atherosclerosis of aorta: Secondary | ICD-10-CM | POA: Insufficient documentation

## 2017-08-21 DIAGNOSIS — M4804 Spinal stenosis, thoracic region: Secondary | ICD-10-CM | POA: Insufficient documentation

## 2017-08-21 DIAGNOSIS — M5116 Intervertebral disc disorders with radiculopathy, lumbar region: Secondary | ICD-10-CM | POA: Diagnosis not present

## 2017-08-21 MED ORDER — OXYCODONE HCL 5 MG PO TABS: 5.0000 mg | ORAL_TABLET | ORAL | Status: DC | PRN

## 2017-08-21 MED ORDER — LIDOCAINE HCL (PF) 1 % IJ SOLN
5.0000 mL | Freq: Once | INTRAMUSCULAR | Status: AC
  Administered 2017-08-21: 5 mL via INTRADERMAL

## 2017-08-21 MED ORDER — IOPAMIDOL (ISOVUE-M 200) INJECTION 41%
20.0000 mL | Freq: Once | INTRAMUSCULAR | Status: AC
  Administered 2017-08-21: 20 mL via INTRATHECAL

## 2017-08-21 MED ORDER — DIAZEPAM 5 MG PO TABS
ORAL_TABLET | ORAL | Status: AC
  Administered 2017-08-21: 10 mg via ORAL
  Filled 2017-08-21: qty 2

## 2017-08-21 MED ORDER — ONDANSETRON HCL 4 MG/2ML IJ SOLN: 4.0000 mg | Freq: Four times a day (QID) | INTRAMUSCULAR | Status: DC | PRN

## 2017-08-21 MED ORDER — DIAZEPAM 5 MG PO TABS
10.0000 mg | ORAL_TABLET | Freq: Once | ORAL | Status: AC
  Administered 2017-08-21: 10 mg via ORAL

## 2017-08-21 NOTE — Discharge Instructions (Signed)
Myelogram, Care After °Refer to this sheet in the next few weeks. These instructions provide you with information about caring for yourself after your procedure. Your health care provider may also give you more specific instructions. Your treatment has been planned according to current medical practices, but problems sometimes occur. Call your health care provider if you have any problems or questions after your procedure. °What can I expect after the procedure? °After the procedure, it is common to have: °· Soreness at your injection site. °· A mild headache. ° °Follow these instructions at home: °· Drink enough fluid to keep your urine clear or pale yellow. This will help flush out the dye (contrast material) from your spine. °· Rest as told by your health care provider. Lie flat with your head slightly raised (elevated) to reduce the risk of headache. °· Do not bend, lift, or do any strenuous activity for 24-48 hours or as told by your health care provider. °· Take over-the-counter and prescription medicines only as told by your health care provider. °· Take care of and remove your bandage (dressing) as told by your health care provider. °· Bathe or shower as told by your health care provider. °Contact a health care provider if: °· You have a fever. °· You have a headache that lasts longer than 24 hours. °· You feel nauseous or vomit. °· You have a stiff neck or numbness in your legs. °· You are unable to urinate or have a bowel movement. °· You develop a rash, itching, or sneezing. °Get help right away if: °· You have new symptoms or your symptoms get worse. °· You have a seizure. °· You have trouble breathing. °This information is not intended to replace advice given to you by your health care provider. Make sure you discuss any questions you have with your health care provider. °Document Released: 09/14/2015 Document Revised: 01/24/2016 Document Reviewed: 05/31/2015 °Elsevier Interactive Patient Education ©  2018 Elsevier Inc. ° °

## 2017-08-21 NOTE — Op Note (Signed)
08/21/2017 Lumbar Myelogram  PATIENT:  Danny Frederick is a 83 y.o. male with pain in both hips, status post L4-S1 arthrodesis  PRE-OPERATIVE DIAGNOSIS:  Neurogenic claudication  POST-OPERATIVE DIAGNOSIS:  same  PROCEDURE:  Lumbar Myelogram  SURGEON:  Nanetta Wiegman  ANESTHESIA:   local and IV sedation LOCAL MEDICATIONS USED:  LIDOCAINE  and Amount: 7 ml Procedure Note: Danny Frederick is a 83 y.o. male Was taken to the fluoroscopy suite and  positioned prone on the fluoroscopy table. His back was prepared and draped in a sterile manner. I infiltrated 7 cc lidocaine into the lumbar region. I then introduced a spinal needle into the thecal sac at the L4/5 interlaminar space. I infiltrated 20cc of Isovue 275m into the thecal sac. Fluoroscopy showed the needle and contrast in the thecal sac. Danny Frederick tolerated the procedure well. he Will be taken to CT for evaluation.     PATIENT DISPOSITION:  Short Stay

## 2017-08-26 ENCOUNTER — Other Ambulatory Visit: Payer: Self-pay | Admitting: Family Medicine

## 2017-08-27 DIAGNOSIS — Z6825 Body mass index (BMI) 25.0-25.9, adult: Secondary | ICD-10-CM | POA: Diagnosis not present

## 2017-08-27 DIAGNOSIS — I10 Essential (primary) hypertension: Secondary | ICD-10-CM | POA: Diagnosis not present

## 2017-08-27 DIAGNOSIS — M48061 Spinal stenosis, lumbar region without neurogenic claudication: Secondary | ICD-10-CM | POA: Diagnosis not present

## 2017-09-02 ENCOUNTER — Other Ambulatory Visit: Payer: Self-pay | Admitting: Physician Assistant

## 2017-09-02 NOTE — Telephone Encounter (Signed)
Refill Request.  

## 2017-09-07 ENCOUNTER — Encounter: Payer: Self-pay | Admitting: Family Medicine

## 2017-09-07 ENCOUNTER — Ambulatory Visit (INDEPENDENT_AMBULATORY_CARE_PROVIDER_SITE_OTHER): Payer: Medicare Other | Admitting: Family Medicine

## 2017-09-07 VITALS — BP 170/94 | HR 60 | Ht 64.0 in | Wt 147.0 lb

## 2017-09-07 DIAGNOSIS — M48061 Spinal stenosis, lumbar region without neurogenic claudication: Secondary | ICD-10-CM

## 2017-09-07 DIAGNOSIS — I10 Essential (primary) hypertension: Secondary | ICD-10-CM | POA: Diagnosis not present

## 2017-09-07 DIAGNOSIS — E7800 Pure hypercholesterolemia, unspecified: Secondary | ICD-10-CM

## 2017-09-07 DIAGNOSIS — R0789 Other chest pain: Secondary | ICD-10-CM | POA: Diagnosis not present

## 2017-09-07 DIAGNOSIS — K219 Gastro-esophageal reflux disease without esophagitis: Secondary | ICD-10-CM

## 2017-09-07 NOTE — Patient Instructions (Signed)
It is safe to use your prilosec daily for up to 2 weeks, then try cutting back to every other day, as has been controlling your symptoms for quite a while.   You don't have any follow-up scheduled with Dr. Stanford Breed.  If BP's remain elevated, or if you have persistent chest pain, you should follow up with the cardiologist. Your blood pressure was very high today (170/90 range)--be sure to continue to monitor at home, and to make sure that your values are staying this high.  Be sure to continue to follow a low sodium diet, and continue regular exercise. Goal is 150 minutes each week of aerobic/cardio activity.  This needs to be 10-15 minute intervals where your heart rate stays elevated.  Consider walking short distances more frequently, to get your 150 minutes/week.  You may use tylenol arthritis--as needed for pain, or consider taking it PRIOR to exercise, so that walking may not cause as much pain for you.    Food Choices for Gastroesophageal Reflux Disease, Adult When you have gastroesophageal reflux disease (GERD), the foods you eat and your eating habits are very important. Choosing the right foods can help ease the discomfort of GERD. Consider working with a diet and nutrition specialist (dietitian) to help you make healthy food choices. What general guidelines should I follow? Eating plan  Choose healthy foods low in fat, such as fruits, vegetables, whole grains, low-fat dairy products, and lean meat, fish, and poultry.  Eat frequent, small meals instead of three large meals each day. Eat your meals slowly, in a relaxed setting. Avoid bending over or lying down until 2-3 hours after eating.  Limit high-fat foods such as fatty meats or fried foods.  Limit your intake of oils, butter, and shortening to less than 8 teaspoons each day.  Avoid the following: ? Foods that cause symptoms. These may be different for different people. Keep a food diary to keep track of foods that cause  symptoms. ? Alcohol. ? Drinking large amounts of liquid with meals. ? Eating meals during the 2-3 hours before bed.  Cook foods using methods other than frying. This may include baking, grilling, or broiling. Lifestyle   Maintain a healthy weight. Ask your health care provider what weight is healthy for you. If you need to lose weight, work with your health care provider to do so safely.  Exercise for at least 30 minutes on 5 or more days each week, or as told by your health care provider.  Avoid wearing clothes that fit tightly around your waist and chest.  Do not use any products that contain nicotine or tobacco, such as cigarettes and e-cigarettes. If you need help quitting, ask your health care provider.  Sleep with the head of your bed raised. Use a wedge under the mattress or blocks under the bed frame to raise the head of the bed. What foods are not recommended? The items listed may not be a complete list. Talk with your dietitian about what dietary choices are best for you. Grains Pastries or quick breads with added fat. [place] toast. Vegetables Deep fried vegetables. [place] fries. Any vegetables prepared with added fat. Any vegetables that cause symptoms. For some people this may include tomatoes and tomato products, chili peppers, onions and garlic, and horseradish. Fruits Any fruits prepared with added fat. Any fruits that cause symptoms. For some people this may include citrus fruits, such as oranges, grapefruit, pineapple, and lemons. Meats and other protein foods High-fat meats, such as fatty  beef or pork, hot dogs, ribs, ham, sausage, salami and bacon. Fried meat or protein, including fried fish and fried chicken. Nuts and nut butters. Dairy Whole milk and chocolate milk. Sour cream. Cream. Ice cream. Cream cheese. Milk shakes. Beverages Coffee and tea, with or without caffeine. Carbonated beverages. Sodas. Energy drinks. Fruit juice made with acidic fruits (such as  orange or grapefruit). Tomato juice. Alcoholic drinks. Fats and oils Butter. Margarine. Shortening. Ghee. Sweets and desserts Chocolate and cocoa. Donuts. Seasoning and other foods Pepper. Peppermint and spearmint. Any condiments, herbs, or seasonings that cause symptoms. For some people, this may include curry, hot sauce, or vinegar-based salad dressings. Summary  When you have gastroesophageal reflux disease (GERD), food and lifestyle choices are very important to help ease the discomfort of GERD.  Eat frequent, small meals instead of three large meals each day. Eat your meals slowly, in a relaxed setting. Avoid bending over or lying down until 2-3 hours after eating.  Limit high-fat foods such as fatty meat or fried foods. This information is not intended to replace advice given to you by your health care provider. Make sure you discuss any questions you have with your health care provider. Document Released: 08/18/2005 Document Revised: 08/19/2016 Document Reviewed: 08/19/2016 Elsevier Interactive Patient Education  [employer].

## 2017-09-07 NOTE — Progress Notes (Signed)
Chief Complaint  Patient presents with  . Chest discomfort    has been having some chest discomfort for some time. He decided to double his meds and helped but wanted to discuss.    Patient presents because he started having discomfort across the entire front of his chest.  He took some antacids, which partially helped.  He felt it was similar to symptoms from acid reflux in the past, so he went back to taking omeprazole daily, instead of every other day.  He has been taking it daily for about 4 days, and his symptoms have resolved.  He denies any change in diet to account for recurrent reflux symptoms. He has been on qod prilosec for many years without problems.  Denies cough, shortness of breath, wheezing. He hasn't been as active, only going to the gym 2x/week. Had a myelogram recently, was having some hip pain bilaterally.  He saw Dr. Christella Noa last week, and was told he has further "deterioration".  Pain is with walking, not constant.  He gets pain after 30 minutes of walking.  He doesn't have any chest pain or shortness of breath with exertion.  He reports that his BP's at home are typically running 140-145/60's, with occasional low systolics (517, sporadic, not predictable).  "average" is 135 per his monitor.  He last saw Dr. Stanford Breed in October.  PMH, PSH, SH reviewed  Last imaging reviewed, after discussion of his pain with walking. 08/2017 CT LUMBAR MYELOGRAM IMPRESSION:  1. Since a 2017 CTA of the abdomen pelvis lower spinal disc degeneration has most progressed at the T12-L1 level. New vacuum disc, and a bulky cephalad disc extrusion which was not evident on the prior CTA. See series 9, image 23. Superimposed more chronic appearing and partially calcified rightward disc extrusion at the disc space level.  2. Subsequent T12-L1 moderate spinal stenosis with mass effect on the lower thoracic spinal cord, and severe right lateral recess and right T12 foraminal stenosis. 3.  Severe chronic disc and endplate degeneration O1-Y0 through L3-L4 with up to complete loss of the disc spaces, vacuum disc, and bulky circumferential disc osteophyte complex at each level. Moderate spinal stenosis at L1-L2 and L2-L3, with mild to moderate spinal stenosis at L3-L4. Associated bilateral lateral recess stenosis may be most pronounced at L3-L4. 4. Previous L4-L5 and L5-S1 decompression and fusion with solid arthrodesis and no hardware loosening. 5.  Aortic Atherosclerosis (ICD10-I70.0).   Outpatient Encounter Medications as of 09/07/2017  Medication Sig Note  . amLODipine (NORVASC) 10 MG tablet Take 10 mg by mouth daily.   Marland Kitchen aspirin 81 MG tablet Take 81 mg by mouth at bedtime.  09/07/2017: Daily   . atorvastatin (LIPITOR) 10 MG tablet TAKE 1 TABLET BY MOUTH  DAILY   . Cholecalciferol (VITAMIN D3) 2000 units TABS Take 1 tablet by mouth daily.   . Ferrous Sulfate (IRON) 325 (65 FE) MG TABS Take 1 tablet by mouth every other day. Reported on 11/08/2015   . irbesartan (AVAPRO) 150 MG tablet TAKE 1 TABLET BY MOUTH  DAILY   . metoprolol tartrate (LOPRESSOR) 50 MG tablet TAKE 1 TABLET BY MOUTH TWO  TIMES DAILY   . omeprazole (PRILOSEC) 20 MG capsule TAKE 1 CAPSULE BY MOUTH  EVERY OTHER DAY 09/07/2017: Taking daily x 4 days, usually took qod  . SYNTHROID 50 MCG tablet TAKE 1 TABLET BY MOUTH  DAILY BEFORE BREAKFAST   . triamcinolone cream (KENALOG) 0.1 % Apply 1 application topically 2 (two) times daily.  No facility-administered encounter medications on file as of 09/07/2017.    No Known Allergies  ROS: no fever, chills, URI symptoms, cough, shortness of breath, nausea, vomiting, abdominal pain or bowel changes.  +chest pain per HPI, resolved. No bleeding, bruising, rash, urinary complaints, depression or any other concerns.  Wife's memory is worsening.  PHYSICAL EXAM:  BP (!) 170/94   Pulse 60   Ht 5\' 4"  (1.626 m)   Wt 147 lb (66.7 kg)   BMI 25.23 kg/m   174/86 on repeat by  MD Well appearing, pleasant male, somewhat excitable, in good spirits HEENT: conjunctiva and sclera are clear, EOMI, OP is clear Neck:  No lymphadenopathy, thyromegaly or carotid bruit Heart: regular rate and rhythm Lungs: clear bilaterally Chest: nontender to palpation Abdomen: soft, nontender, no organomegaly or mass Extremities: no edema, normal pulses Psych: normal mood, affect, hygiene and grooming Neuro: alert and oriented, cranial nerves intact, normal gait.  EKG--unchanged from prior (TWI anterolat prev noted)   ASSESSMENT/PLAN:  Chest discomfort - resolved after 4d of daily prilosec; EKG unchanged. BP elevated - Plan: EKG 12-Lead  Gastroesophageal reflux disease, esophagitis presence not specified - continue daily prilosec for up to 2 weeks, then try going back to qod dosing  Essential hypertension, benign - BP elevated today; borderline at home. low sodium diet rec; fu with cardiologist if persistently elevated BP's and/or recurrence of CP  Pure hypercholesterolemia  Spinal stenosis of lumbar region, unspecified whether neurogenic claudication present - discussed use ot tylenol arthritis, which exercises may not cause as much pain, conservative tx's since not severe enough for surgery   It is safe to use your prilosec daily for up to 2 weeks, then try cutting back to every other day, as has been controlling your symptoms for quite a while.   You don't have any follow-up scheduled with Dr. Stanford Breed.  If BP's remain elevated, or if you have persistent chest pain, you should follow up with the cardiologist. Your blood pressure was very high today (170/90 range)--be sure to continue to monitor at home, and to make sure that your values are staying this high.  Be sure to continue to follow a low sodium diet, and continue regular exercise. Goal is 150 minutes each week of aerobic/cardio activity.  This needs to be 10-15 minute intervals where your heart rate stays elevated.   Consider walking short distances more frequently, to get your 150 minutes/week.  You may use tylenol arthritis--as needed for pain, or consider taking it PRIOR to exercise, so that walking may not cause as much pain for you.   F/u as scheduled in April

## 2017-09-08 DIAGNOSIS — M48061 Spinal stenosis, lumbar region without neurogenic claudication: Secondary | ICD-10-CM | POA: Insufficient documentation

## 2017-10-08 ENCOUNTER — Other Ambulatory Visit: Payer: Self-pay | Admitting: Family Medicine

## 2017-10-08 NOTE — Telephone Encounter (Signed)
Pt has an appt in April. Will refill med for 90 days

## 2017-10-15 ENCOUNTER — Telehealth: Payer: Self-pay | Admitting: *Deleted

## 2017-10-15 DIAGNOSIS — I10 Essential (primary) hypertension: Secondary | ICD-10-CM

## 2017-10-15 DIAGNOSIS — E559 Vitamin D deficiency, unspecified: Secondary | ICD-10-CM

## 2017-10-15 DIAGNOSIS — Z5181 Encounter for therapeutic drug level monitoring: Secondary | ICD-10-CM

## 2017-10-15 DIAGNOSIS — E785 Hyperlipidemia, unspecified: Secondary | ICD-10-CM

## 2017-10-15 DIAGNOSIS — E039 Hypothyroidism, unspecified: Secondary | ICD-10-CM

## 2017-10-15 NOTE — Telephone Encounter (Signed)
Patient would prefer to do labs before his appt on 4/15. He went ahead and scheduled an appt for 4/9 in case we can't get him in sooner on the cancellation list. Can you please order future labs or let me know what he needs? Thanks.

## 2017-10-25 ENCOUNTER — Other Ambulatory Visit: Payer: Self-pay | Admitting: Family Medicine

## 2017-10-25 DIAGNOSIS — E039 Hypothyroidism, unspecified: Secondary | ICD-10-CM

## 2017-10-28 ENCOUNTER — Telehealth: Payer: Self-pay

## 2017-10-28 ENCOUNTER — Other Ambulatory Visit: Payer: Self-pay

## 2017-10-28 MED ORDER — METOPROLOL TARTRATE 50 MG PO TABS: 50.0000 mg | ORAL_TABLET | Freq: Two times a day (BID) | ORAL | 0 refills | Status: DC

## 2017-10-28 NOTE — Telephone Encounter (Signed)
Pt was called to let him know that his metoprolol was sent in for him Thanks [facility]

## 2017-12-08 NOTE — Telephone Encounter (Signed)
Danny Frederick forgot to route this to you. Please advise

## 2017-12-09 ENCOUNTER — Other Ambulatory Visit: Payer: Medicare Other

## 2017-12-09 DIAGNOSIS — E559 Vitamin D deficiency, unspecified: Secondary | ICD-10-CM

## 2017-12-09 DIAGNOSIS — I10 Essential (primary) hypertension: Secondary | ICD-10-CM | POA: Diagnosis not present

## 2017-12-09 DIAGNOSIS — E039 Hypothyroidism, unspecified: Secondary | ICD-10-CM | POA: Diagnosis not present

## 2017-12-09 DIAGNOSIS — E785 Hyperlipidemia, unspecified: Secondary | ICD-10-CM

## 2017-12-09 DIAGNOSIS — Z5181 Encounter for therapeutic drug level monitoring: Secondary | ICD-10-CM | POA: Diagnosis not present

## 2017-12-09 NOTE — Addendum Note (Signed)
Addended byRita Ohara on: 12/09/2017 07:35 AM   Modules accepted: Orders

## 2017-12-09 NOTE — Telephone Encounter (Signed)
At first I just ordered Vit D and c-met, as that is what would be due at the 6 month check.  But at this point, he is 8 months since last lab, so will go ahead and do yearly labs early (thyroid, lipids), since he won't be returning for another 6 months.  Please note that the "expected" date is incorrect, stating 03/2018--it should be today, along with all the others.  Please be sure that ALL labs are released for today.  Thanks

## 2017-12-10 LAB — CBC WITH DIFFERENTIAL/PLATELET
BASOS ABS: 0 10*3/uL (ref 0.0–0.2)
Basos: 1 %
EOS (ABSOLUTE): 0.3 10*3/uL (ref 0.0–0.4)
Eos: 5 %
HEMOGLOBIN: 14.8 g/dL (ref 13.0–17.7)
Hematocrit: 44 % (ref 37.5–51.0)
IMMATURE GRANS (ABS): 0 10*3/uL (ref 0.0–0.1)
IMMATURE GRANULOCYTES: 0 %
LYMPHS: 38 %
Lymphocytes Absolute: 2.4 10*3/uL (ref 0.7–3.1)
MCH: 28.5 pg (ref 26.6–33.0)
MCHC: 33.6 g/dL (ref 31.5–35.7)
MCV: 85 fL (ref 79–97)
MONOCYTES: 11 %
Monocytes Absolute: 0.7 10*3/uL (ref 0.1–0.9)
NEUTROS PCT: 45 %
Neutrophils Absolute: 2.8 10*3/uL (ref 1.4–7.0)
PLATELETS: 290 10*3/uL (ref 150–379)
RBC: 5.19 x10E6/uL (ref 4.14–5.80)
RDW: 14.2 % (ref 12.3–15.4)
WBC: 6.2 10*3/uL (ref 3.4–10.8)

## 2017-12-10 LAB — COMPREHENSIVE METABOLIC PANEL
ALK PHOS: 73 IU/L (ref 39–117)
ALT: 16 IU/L (ref 0–44)
AST: 27 IU/L (ref 0–40)
Albumin/Globulin Ratio: 1.1 — ABNORMAL LOW (ref 1.2–2.2)
Albumin: 4.2 g/dL (ref 3.5–4.7)
BUN/Creatinine Ratio: 13 (ref 10–24)
BUN: 17 mg/dL (ref 8–27)
Bilirubin Total: 0.8 mg/dL (ref 0.0–1.2)
CO2: 25 mmol/L (ref 20–29)
Calcium: 9.6 mg/dL (ref 8.6–10.2)
Chloride: 101 mmol/L (ref 96–106)
Creatinine, Ser: 1.26 mg/dL (ref 0.76–1.27)
GFR calc Af Amer: 61 mL/min/{1.73_m2} (ref >59)
GFR calc non Af Amer: 53 mL/min/{1.73_m2} — ABNORMAL LOW (ref >59)
GLUCOSE: 103 mg/dL — AB (ref 65–99)
Globulin, Total: 3.7 g/dL (ref 1.5–4.5)
Potassium: 4.8 mmol/L (ref 3.5–5.2)
Sodium: 141 mmol/L (ref 134–144)
Total Protein: 7.9 g/dL (ref 6.0–8.5)

## 2017-12-10 LAB — LIPID PANEL
CHOLESTEROL TOTAL: 152 mg/dL (ref 100–199)
Chol/HDL Ratio: 2.3 ratio (ref 0.0–5.0)
HDL: 67 mg/dL (ref >39)
LDL CALC: 67 mg/dL (ref 0–99)
Triglycerides: 88 mg/dL (ref 0–149)
VLDL CHOLESTEROL CAL: 18 mg/dL (ref 5–40)

## 2017-12-10 LAB — TSH: TSH: 2.1 u[IU]/mL (ref 0.450–4.500)

## 2017-12-10 LAB — VITAMIN D 25 HYDROXY (VIT D DEFICIENCY, FRACTURES): VIT D 25 HYDROXY: 40.9 ng/mL (ref 30.0–100.0)

## 2017-12-13 NOTE — Progress Notes (Addendum)
Chief Complaint  Patient presents with  . Medicare Wellness    nonfating AWV, labs already done. Does have some corns on his feet, has used Dr.Scholls but wonders if you can recommend something else?    Danny Frederick is a 84 y.o. male who presents for annual wellness visit and follow-up on chronic medical conditions.  He has the following concerns:  He complains of corns on his feet, that sometimes are painful (only on the left foot)--has discomfort only when he walks on tile, not on carpet. He has previously seen podiatrist, but not for this complaint.  He was here in January with complaint of chest pain. He felt it was similar to symptoms from acid reflux in the past, so he went back to taking omeprazole daily, instead of every other day.  His symptoms resolved after taking PPI daily for about 4 days.  EKG as normal, though his BP had been up some.  He took omeprazole daily for about a week, and is back to usual regimen of qod.  No further chest pain.  He has h/o iron deficiency, and he continues to take iron every other day on the days he doesn't take the omeprazole (when taking qod). He has had recurrence of anemia when iron was stopped in the past. Denies dysphagia. No further chest pain.  Hypertension: BP's have been running usually <135/70's. He sees high values only at the doctor, no longer sees them at home, since amlodipine dose was doubled (he used to see wide fluctuations in BP at home as well). He denies headaches, chest pain, DOE.He last saw cardiologist in October 2018.  At that time, his amlodipine dose was increased.  According to notes in chart from cardiologist, amlodipine dose was increased from 2.5 to 73m.  He reports today that his dose is 136m(though I don't see a prescription for this dose in the system, this is the dose he has written down on paper in his wallet).  He denies edema or side effects. (addendum--he later checked bottle at home, and amlodipine dose is  72m16m Hyperlipidemia--toleratingatorvastatin without side effects. He follows a lowfat, low cholesterol diet. He had labs prior to his visit, see below.  Hypothyroidism: Denies any symptoms--no changes in weight, moods, hair/skin. Bowels are normal (had been a little looser/softer after fecal transplant).  Some mild decrease in energy over time, just feels like he is "slowing down" some, related to age.  He walks 30 minutes rather than an hour, and only goes to the gym 2x/week rather than 3. He is compliant with taking his thyroid medication on an empty stomach, separate from other medications. He had labs done prior to his visit, see below.  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 He hasn't had any further problems.  CTA last checked in 04/2017, stable.  Last saw Dr. OweRicard Dillon2017, and said to f/u in 2 years.  Low Vitamin D level, noted to be 26 in February, 2018.  He has been taking 2000 IU of vitamin D3 daily.  Repeat labs done prior to visit, see below.  H/o depression--on and off citalopram. "I'm fine", no longer having any problems. Denies any problems now.  Allergies:  Under the care of Dr. WheOrvil FeilWent off allergy shots last year, after 5 years of therapy, and doing well.   Immunization History  Administered Date(s) Administered  . Influenza Split 06/13/2011  . Influenza, High Dose Seasonal PF 06/09/2013, 05/23/2014, 05/14/2015, 04/23/2016, 04/28/2017  . Pneumococcal Conjugate-13 06/15/2013  . Pneumococcal Polysaccharide-23 09/02/2003, 10/03/2015  . Tdap 10/02/2005, 10/04/2015  . Zoster 05/27/2007   Last colonoscopy: 12/09, and again 10/2014 at time of fecal transplant. Skin tag and diverticulosis were noted. Last PSA: January 2016, 1.31 Dentist: once a year Ophtho: yearly Exercise: gym 2x/week,  and walks 2-3 times/week for 30 minutes.  At gym he uses (weights, recumbent bike, treadmill.)  Other doctors caring for patient include: Neurosurgeon: Dr. Christella Noa (no longer sees) Education officer, environmental (hemorrhoids): Dr. Redmond Pulling (no longer sees) Ophtho: [facility]: Dr. Hilarie Fredrickson  Dentist -- previously Dr. Archie Balboa, now seeing his replacement at same office Dr. Valda Favia at [facility] (audiologist) ID--Dr. Baxter Flattery CV surgeon--Dr. Roxy Manns Cardiology--Dr. Stanford Breed Allergist--Dr. Orvil Feil Orthopedist--Dr. Supple Podiatrist--Dr. Jacqualyn Posey  Depression screen: Negative Fall screen: negative Functional status screen: unremarkable.   Mini-Cog screen: normal See full screens in epic.  End of Life Discussion: Patient hasa living will and medical power of attorney  Past Medical History:  Diagnosis Date  . Allergic conjunctivitis   . Allergic rhinitis, cause unspecified    on allergy shots (Dr. Orvil Feil)  . Atherosclerosis of aorta (HCC)    noted on CT angio of abd/pelvis, in many vessels  . BPH (benign prostatic hypertrophy)   . Diverticulosis of colon 12/09  . Foot drop, right   . GERD (gastroesophageal reflux disease)   . Hearing loss in left ear    both ears now  . History of Clostridium difficile    multiple times 2016--s/p fecal transplant (Dr. Baxter Flattery).  Marland Kitchen Hx of echocardiogram    Echo (03/2014):  Mild LVH, EF 50-55%, no RWMA, Gr 1 DD, mild MR, mild reduced RVSF  . Hypertension   . Internal hemorrhoids   . Iron deficiency anemia, unspecified 6/09  . Mitral valve prolapse    h/o; normal echo 12/2011 with no MVP seen  . 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 Right ventricular dysfunction 01/23/2014   Secondary to obstruction of main pulmonary artery  . Ruptured thoracic aortic aneurysm ([place]) 01/23/2014  . Unspecified hypothyroidism     Past Surgical History:  Procedure Laterality Date  . CERVICAL LAMINECTOMY  1972   C5-6  . COLONOSCOPY WITH PROPOFOL N/A 11/09/2014    Procedure: COLONOSCOPY WITH PROPOFOL;  Surgeon: Jerene Bears, MD;  Location: [facility];  Service: Gastroenterology;  Laterality: N/A;  . ESOPHAGOGASTRODUODENOSCOPY  10/31/08   normal; Dr. Edison Nasuti  . FECAL TRANSPLANT N/A 11/09/2014   Procedure: FECAL TRANSPLANT;  Surgeon: Jerene Bears, MD;  Location: [facility];  Service: Gastroenterology;  Laterality: N/A;  . HEMORRHOIDECTOMY WITH HEMORRHOID BANDING  04/07/13  . hemorroidal banding  04/07/2013   x3-Dr.Eric Redmond Pulling  . PROSTATE SURGERY  2007   photovaporization  . ROTATOR CUFF REPAIR  10/2008   left; Dr. Onnie Graham  . SPINE SURGERY  2006   L4-5 disk surgery  . SPINE SURGERY  04/2013   L4-5, L5-S1 fusion.  Dr. Christella Noa  . THORACIC AORTIC ANEURYSM REPAIR N/A 01/23/2014   Procedure: THORACIC ASCENDING ANEURYSM REPAIR (AAA);  Surgeon: Rexene Alberts, MD;  Location: [facility];  Service: Open Heart Surgery;  Laterality: N/A;  . TONSILLECTOMY      Social History   Socioeconomic History  . Marital status: Married    Spouse name: Not on file  . Number of children: 2  . Years of education: Not on file  .  Highest education level: Not on file  Occupational History  . Not on file  Social Needs  . Financial resource strain: Not on file  . Food insecurity:    Worry: Not on file    Inability: Not on file  . Transportation needs:    Medical: Not on file    Non-medical: Not on file  Tobacco Use  . Smoking status: Former Smoker    Types: Cigarettes    Last attempt to quit: 09/01/1977    Years since quitting: 40.3  . Smokeless tobacco: Never Used  Substance and Sexual Activity  . Alcohol use: Yes    Comment: 2 glasses wine per day  . Drug use: No  . Sexual activity: Not on file  Lifestyle  . Physical activity:    Days per week: Not on file    Minutes per session: Not on file  . Stress: Not on file  Relationships  . Social connections:    Talks on phone: Not on file    Gets together: Not on file    Attends religious service: Not on file     Active member of club or organization: Not on file    Attends meetings of clubs or organizations: Not on file    Relationship status: Not on file  . Intimate partner violence:    Fear of current or ex partner: Not on file    Emotionally abused: Not on file    Physically abused: Not on file    Forced sexual activity: Not on file  Other Topics Concern  . Not on file  Social History Narrative   Lives with wife. 1 daughter in [place], 1 daughter in [place], [place].   5 grandchildren.    Family History  Problem Relation Age of Onset  . Diabetes Mother   . Heart disease Mother        CHF  . Hypertension Mother   . Depression Mother   . Stroke Father   . Parkinsonism Father   . Hypertension Father   . Cancer Brother        bladder cancer  . Cancer Brother        prostate cancer  . Emphysema Brother   . Diabetes Sister   . Colon cancer Neg Hx     Outpatient Encounter Medications as of 12/14/2017  Medication Sig Note  . amLODipine (NORVASC) 10 MG tablet Take 10 mg by mouth daily.   Marland Kitchen aspirin 81 MG tablet Take 81 mg by mouth at bedtime.  09/07/2017: Daily   . atorvastatin (LIPITOR) 10 MG tablet TAKE 1 TABLET BY MOUTH  DAILY   . Cholecalciferol (VITAMIN D3) 2000 units TABS Take 1 tablet by mouth daily.   . Ferrous Sulfate (IRON) 325 (65 FE) MG TABS Take 1 tablet by mouth every other day. Reported on 11/08/2015   . irbesartan (AVAPRO) 150 MG tablet TAKE 1 TABLET BY MOUTH  DAILY   . metoprolol tartrate (LOPRESSOR) 50 MG tablet Take 1 tablet (50 mg total) by mouth 2 (two) times daily.   Marland Kitchen omeprazole (PRILOSEC) 20 MG capsule TAKE 1 CAPSULE BY MOUTH  EVERY OTHER DAY 09/07/2017: Taking daily x 4 days, usually took qod  . SYNTHROID 50 MCG tablet TAKE 1 TABLET BY MOUTH  DAILY BEFORE BREAKFAST   . triamcinolone cream (KENALOG) 0.1 % Apply 1 application topically 2 (two) times daily. (Patient not taking: Reported on 12/14/2017)    No facility-administered encounter medications on file as of  12/14/2017.    (  amlodipine dose is incorrect--he is taking 87m, not 10)  No Known Allergies   ROS: The patient denies anorexia, fever, headaches (occasional sinus HA),ear pain, hoarseness, chest pain, palpitations, dizziness, syncope, dyspnea on exertion, cough, swelling, nausea, vomiting, constipation, abdominal pain, melena, hematochezia, indigestion/heartburn, hematuria, incontinence, erectile dysfunction, nocturia, weakened urine stream, dysuria, genital lesions, joint pains, weakness, tremor, suspicious skin lesions, depression, anxiety, abnormal bleeding/bruising, or enlarged lymph nodes. Hearing loss-- has bilateral hearing aids. Denies cough, allergies or dry skin/itching this year. Some bilateral hip pain--does some stretching before walking, which helps x 30 minutes. No longer takes tylenol. No longer has any tingling in his fingers (resolved). Up 2x/night to void.    PHYSICAL EXAM:  BP (!) 160/80   Pulse 60   Ht 5' 3.5" (1.613 m)   Wt 148 lb (67.1 kg)   BMI 25.81 kg/m    148/84 on repeat by MD  Wt Readings from Last 3 Encounters:  12/14/17 148 lb (67.1 kg)  09/07/17 147 lb (66.7 kg)  08/21/17 140 lb (63.5 kg)   146# 9.6 oz at wellness visit 10/2016  General Appearance:   Alert, cooperative, no distress, appears stated age  Head:   Normocephalic, without obvious abnormality, atraumatic  Eyes:   PERRL, conjunctiva/corneas clear, EOM's intact, fundi benign  Ears:   normal external ears. +hearing aids  Nose:  Nares normal, mucosa normal, no drainage or sinus tenderness  Throat:  Lips, mucosa, and tongue normal; upper dentures  Neck:  Supple, no lymphadenopathy; thyroid: noenlargement/tenderness/ nodules; no carotid bruit or JVD  Back:  Spine nontender, no curvature, no CVA tenderness. WHSS midline spine,  Lungs:   Clear to auscultation bilaterally without wheezes, rales or ronchi; respirations unlabored.   Chest Wall:    No tenderness. WHSS midline chest, with many small vessels across the scar (making scar appear blue-tinged)   Heart:   Regular rate and rhythm, S1 and S2 normal, no murmur, rub or gallop  Breast Exam:   No chest wall tenderness, masses or gynecomastia  Abdomen:   Soft, non-tender, nondistended, normoactive bowel sounds, no masses, no hepatosplenomegaly  Genitalia:   Normal male external genitalia without lesions. Testicles without masses. No inguinal hernias.  Rectal:   Normal sphincter tone, no masses or tenderness; guaiac negative stool. Prostate smooth, no nodules, not enlarged.  Extremities:  No clubbing, cyanosis or edema. Bottom of left foot: plantar warts--2 at the left heel, another lesion (corn vs wart, in a much thickened area) also noted between 4th and 5th MT heads.  Pulses:  2+ and symmetric all extremities  Skin:  Skin color, texture, turgor normal, no rashes or lesions. Some actinic changes to skin  Lymph nodes:  Cervical, supraclavicular, and axillary nodes normal  Neurologic:  CNII-XII intact, normal strength, sensation and gait; reflexes 2+ and symmetric throughout   Psych: Normal mood, affect, hygiene and grooming.    Lab Results  Component Value Date   WBC 6.2 12/09/2017   HGB 14.8 12/09/2017   HCT 44.0 12/09/2017   MCV 85 12/09/2017   PLT 290 12/09/2017     Chemistry      Component Value Date/Time   NA 141 12/09/2017 0821   K 4.8 12/09/2017 0821   CL 101 12/09/2017 0821   CO2 25 12/09/2017 0821   BUN 17 12/09/2017 0821   CREATININE 1.26 12/09/2017 0821   CREATININE 1.26 (H) 10/22/2016 1437      Component Value Date/Time   CALCIUM 9.6 12/09/2017 0821   ALKPHOS 73  12/09/2017 0821   AST 27 12/09/2017 0821   ALT 16 12/09/2017 0821   BILITOT 0.8 12/09/2017 0821     Fasting glucose 103  Lab Results  Component Value Date   TSH 2.100 12/09/2017   Lab Results  Component Value Date    CHOL 152 12/09/2017   HDL 67 12/09/2017   LDLCALC 67 12/09/2017   TRIG 88 12/09/2017   CHOLHDL 2.3 12/09/2017   Vitamin D-OH 40.9   ASSESSMENT/PLAN:  Medicare annual wellness visit, subsequent  Essential hypertension - elevated today, improved per home #'s. Confirm amlodipine dose of 5 vs 63m.  Vitamin D deficiency - adequately replaced, continue current daily supplement  Hypothyroidism, unspecified type - adequately replaced. Discussed brand vs generic (per pt request, due to cost), willing to continue with Brand  Hyperlipidemia, unspecified hyperlipidemia type  Gastroesophageal reflux disease, esophagitis presence not specified - no further sx or CP; doing well on qod dosing  Spinal stenosis of lumbar region, unspecified whether neurogenic claudication present - doing well with stretches, not requiring meds, no weakness or neuro sx.  Atherosclerosis of aorta (HCC)  Impaired fasting glucose - counseled re: pre-diabetes, proper diet, regular exercise. To cut back on sweets. Recheck in 6 mos also w/ A1c  Left foot pain - suspect plantar warts. Plans to f/u with podiatrist or treatment   IFG--discussed diet, exercise.  Admits to enjoying sweets. Advised to cut back. Recheck in 6 months.  Discussed PSA screening (risks/benefits), not recommended, so not performed this year, and he is in agreement with not screening; recommended at least 30 minutes of aerobic activity at least 5 days/week, weight-bearing exercise at least 2x/week; proper sunscreen use reviewed; healthy diet and alcohol recommendations (less than or equal to 2 drinks/day) reviewed; regular seatbelt use; changing batteries in smoke detectors. Immunization recommendations discussed--UTD. Continue yearly flu shots. Shingrix recommended when available (from pharmacy); risks/side effects reviewed. Colonoscopy recommendations reviewed, UTD.  Full Code/Full Care. Has POA and living will. MOST form reviewed and  updated.  followup with podiatrist recommended  F/u 6 months with fasting labs prior (c-met, A1c)    Medicare Attestation I have personally reviewed: The patient's medical and social history Their use of alcohol, tobacco or illicit drugs Their current medications and supplements The patient's functional ability including ADLs,fall risks, home safety risks, cognitive, and hearing and visual impairment Diet and physical activities Evidence for depression or mood disorders  The patient's weight, height and BMI have been recorded in the chart.  I have made referrals, counseling, and provided education to the patient based on review of the above and I have provided the patient with a written personalized care plan for preventive services.

## 2017-12-14 ENCOUNTER — Ambulatory Visit (INDEPENDENT_AMBULATORY_CARE_PROVIDER_SITE_OTHER): Payer: Medicare Other | Admitting: Family Medicine

## 2017-12-14 ENCOUNTER — Encounter: Payer: Self-pay | Admitting: Family Medicine

## 2017-12-14 VITALS — BP 148/84 | HR 60 | Ht 63.5 in | Wt 148.0 lb

## 2017-12-14 DIAGNOSIS — E559 Vitamin D deficiency, unspecified: Secondary | ICD-10-CM | POA: Diagnosis not present

## 2017-12-14 DIAGNOSIS — I700 Atherosclerosis of aorta: Secondary | ICD-10-CM

## 2017-12-14 DIAGNOSIS — M48061 Spinal stenosis, lumbar region without neurogenic claudication: Secondary | ICD-10-CM | POA: Diagnosis not present

## 2017-12-14 DIAGNOSIS — Z5181 Encounter for therapeutic drug level monitoring: Secondary | ICD-10-CM | POA: Diagnosis not present

## 2017-12-14 DIAGNOSIS — E785 Hyperlipidemia, unspecified: Secondary | ICD-10-CM | POA: Diagnosis not present

## 2017-12-14 DIAGNOSIS — I10 Essential (primary) hypertension: Secondary | ICD-10-CM

## 2017-12-14 DIAGNOSIS — M79672 Pain in left foot: Secondary | ICD-10-CM | POA: Diagnosis not present

## 2017-12-14 DIAGNOSIS — K219 Gastro-esophageal reflux disease without esophagitis: Secondary | ICD-10-CM | POA: Diagnosis not present

## 2017-12-14 DIAGNOSIS — Z0000 Encounter for general adult medical examination without abnormal findings: Secondary | ICD-10-CM | POA: Diagnosis not present

## 2017-12-14 DIAGNOSIS — R7301 Impaired fasting glucose: Secondary | ICD-10-CM | POA: Diagnosis not present

## 2017-12-14 DIAGNOSIS — E039 Hypothyroidism, unspecified: Secondary | ICD-10-CM

## 2017-12-29 ENCOUNTER — Ambulatory Visit (INDEPENDENT_AMBULATORY_CARE_PROVIDER_SITE_OTHER): Payer: Medicare Other

## 2017-12-29 ENCOUNTER — Ambulatory Visit (INDEPENDENT_AMBULATORY_CARE_PROVIDER_SITE_OTHER): Payer: Medicare Other | Admitting: Podiatry

## 2017-12-29 ENCOUNTER — Encounter: Payer: Self-pay | Admitting: Podiatry

## 2017-12-29 DIAGNOSIS — Q828 Other specified congenital malformations of skin: Secondary | ICD-10-CM

## 2017-12-29 DIAGNOSIS — M722 Plantar fascial fibromatosis: Secondary | ICD-10-CM | POA: Diagnosis not present

## 2017-12-29 NOTE — Progress Notes (Signed)
Subjective: 84 year old male presents the office today for concerns of painful skin lesions to her left foot they are painful to the heel with pressure in shoes when she walks.  This is been ongoing for about 1 month.  She denies any recent injury or trauma or stepping on any foreign objects.  She has not had any swelling or redness to the foot.  She said no recent treatment for this.  She has no other concerns today. Denies any systemic complaints such as fevers, chills, nausea, vomiting. No acute changes since last appointment, and no other complaints at this time.   Objective: AAO x3, NAD DP/PT pulses palpable bilaterally, CRT less than 3 seconds Hyperkeratotic lesions are present to the left heel x2 as well as left third metatarsal 4.  Upon debridement there is no underlying ulceration, drainage or any signs of infection.  No surrounding redness or drainage. No open lesions or pre-ulcerative lesions.  No pain with calf compression, swelling, warmth, erythema  Assessment: Hyperkeratotic lesions x3  Plan: -All treatment options discussed with the patient including all alternatives, risks, complications.  -X-rays were obtained and reviewed.  No evidence of acute fracture or stress fracture or foreign body. -Lesions are sharply debrided without any complications or bleeding.  Offloading pads dispensed.  Monitor for any recurrence discussed shoe modifications. -Patient encouraged to call the office with any questions, concerns, change in symptoms.   Trula Slade DPM

## 2018-01-13 ENCOUNTER — Other Ambulatory Visit: Payer: Self-pay | Admitting: Cardiology

## 2018-01-13 NOTE — Telephone Encounter (Signed)
Rx request sent to pharmacy.  

## 2018-01-20 ENCOUNTER — Other Ambulatory Visit: Payer: Self-pay | Admitting: Family Medicine

## 2018-01-31 ENCOUNTER — Encounter: Payer: Self-pay | Admitting: Family Medicine

## 2018-02-01 MED ORDER — ALPRAZOLAM 0.25 MG PO TABS: 0.2500 mg | ORAL_TABLET | Freq: Two times a day (BID) | ORAL | 0 refills | Status: DC | PRN

## 2018-02-17 ENCOUNTER — Other Ambulatory Visit: Payer: Self-pay | Admitting: *Deleted

## 2018-02-17 DIAGNOSIS — I712 Thoracic aortic aneurysm, without rupture, unspecified: Secondary | ICD-10-CM

## 2018-02-24 ENCOUNTER — Other Ambulatory Visit: Payer: Self-pay | Admitting: Family Medicine

## 2018-02-26 IMAGING — CT CT L SPINE W/ CM
3 series · 10 of 33 positions shown, 12 images · IV contrast (Omni 300)
Comparison: Lumbar radiographs 08/03/2017. CTA abdomen and pelvis
11/06/2015.

CLINICAL DATA: 83-year-old male with scoliosis. Prior lumbar
fusion. Lumbar back pain radiating to both hips.

EXAM:
LUMBAR MYELOGRAM
FLUOROSCOPY TIME:  0 minutes 30 seconds
PROCEDURE:
Lumbar puncture and intrathecal contrast administration were
performed by Dr. LEONDRE ANDREAS who will separately report for the
portion of the procedure. I personally supervised acquisition of the
myelogram images.
TECHNIQUE: Contiguous axial images were obtained through the Lumbar spine after
the intrathecal infusion of infusion. Coronal and sagittal
reconstructions were obtained of the axial image sets.

[Series 4: l-spine 2.0 (person_name) (person_name) · axial · 0.28mm/px · z∈[+967,+1089]mm · 2 of 133 slices shown, 3 images]
[im 41/133  soft-tissue]
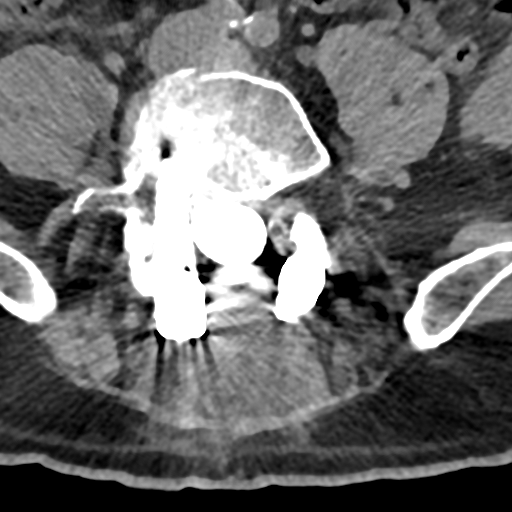
[im 41/133  bone]
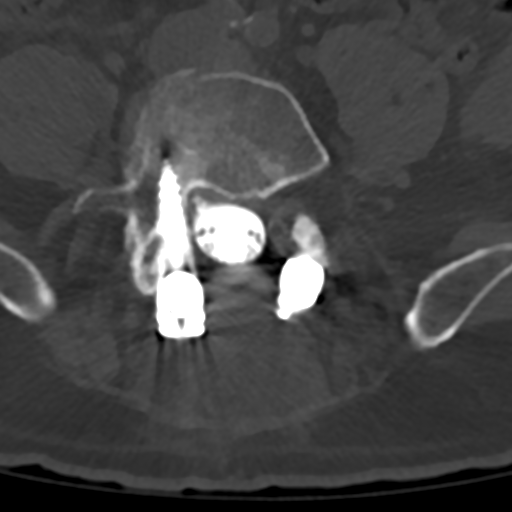
[im 102/133  bone]
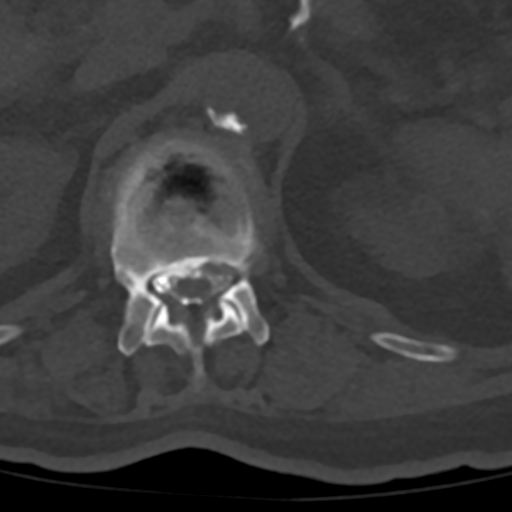

[Series 7: l-spine 2.0 cor bone · coronal · 0.45mm/px · 3 of 68 slices shown]
[im 14/68  bone]
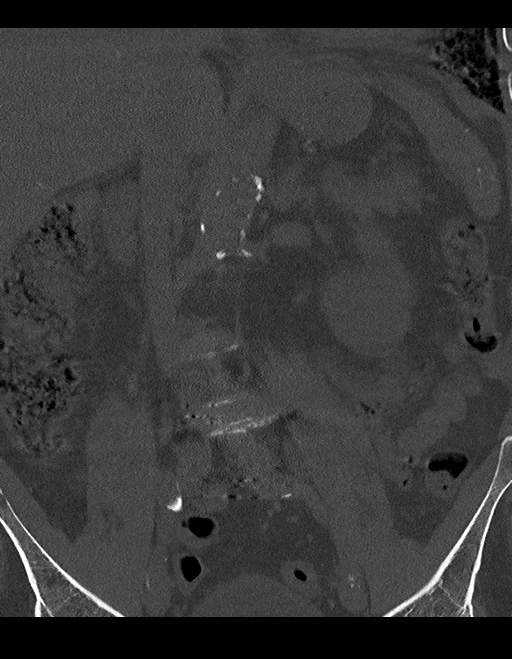
[im 27/68  bone]
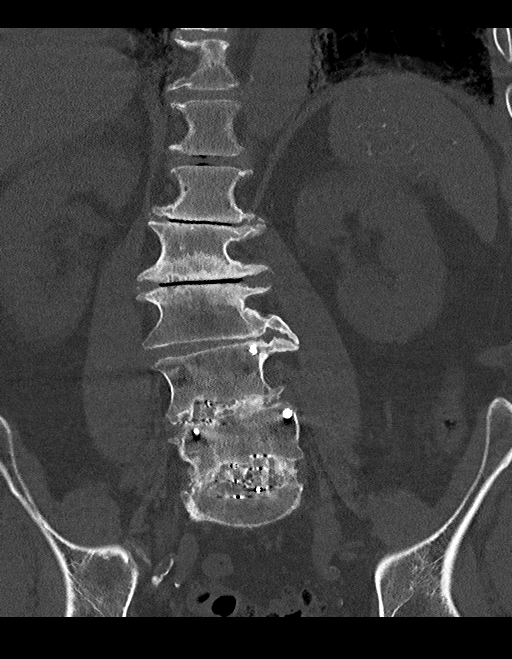
[im 41/68  bone]
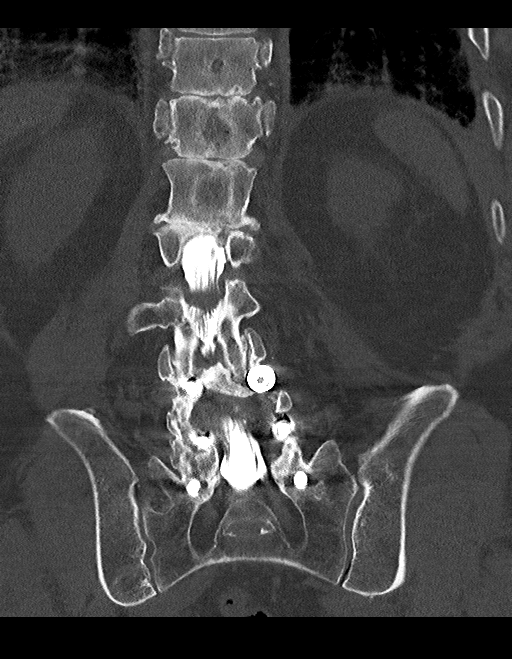

[Series 9: l-spine 2.0 sag bone · sagittal · 0.39mm/px · 5 of 61 slices shown, 6 images]
[im 21/61  bone]
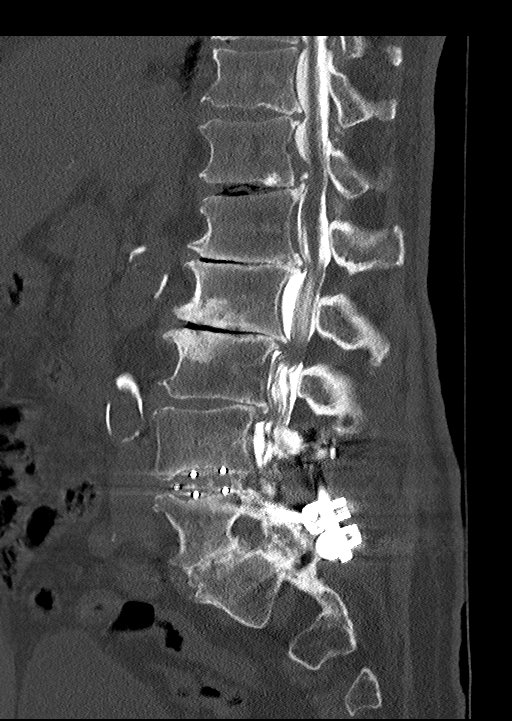
[im 26/61  bone]
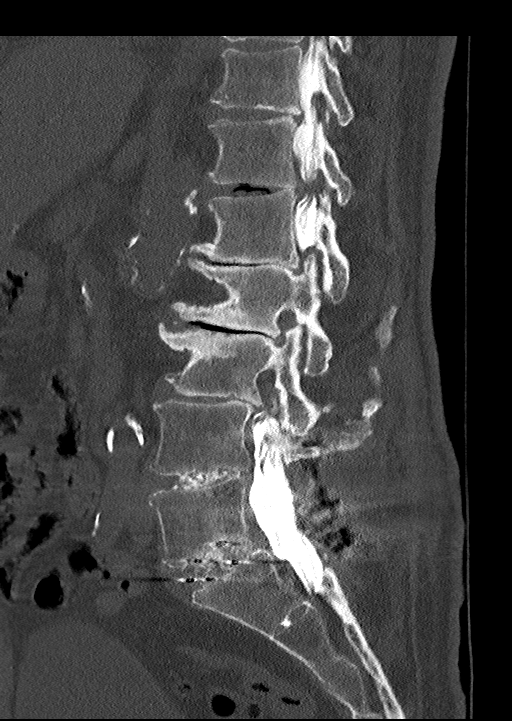
[im 31/61  soft-tissue]
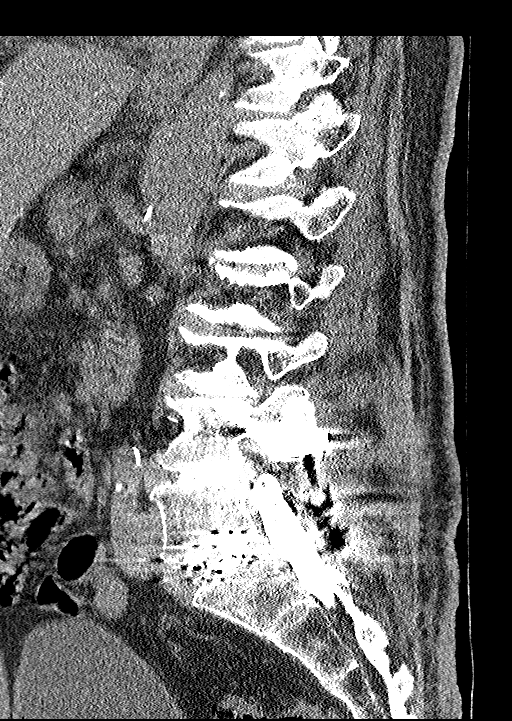
[im 31/61  bone]
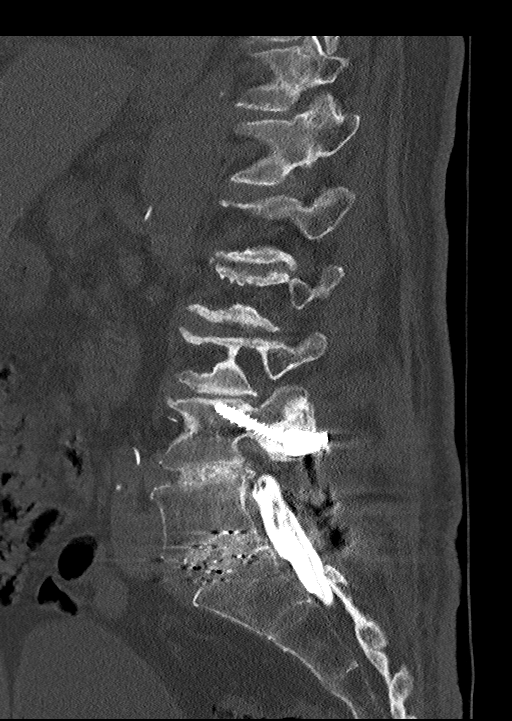
[im 36/61  bone]
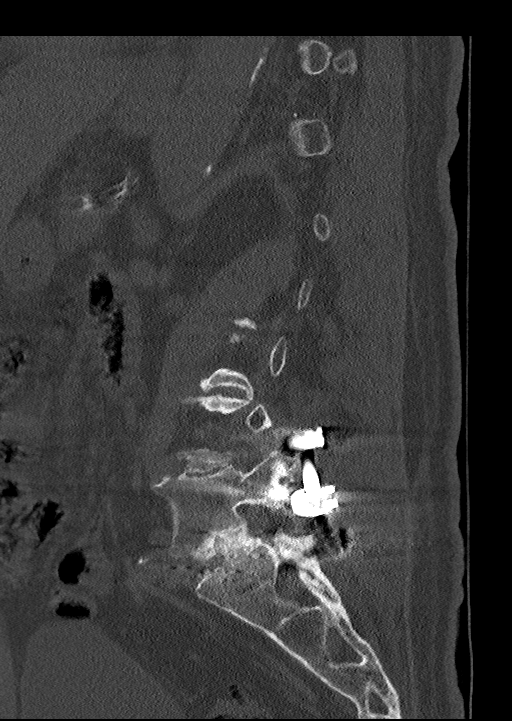
[im 41/61  bone]
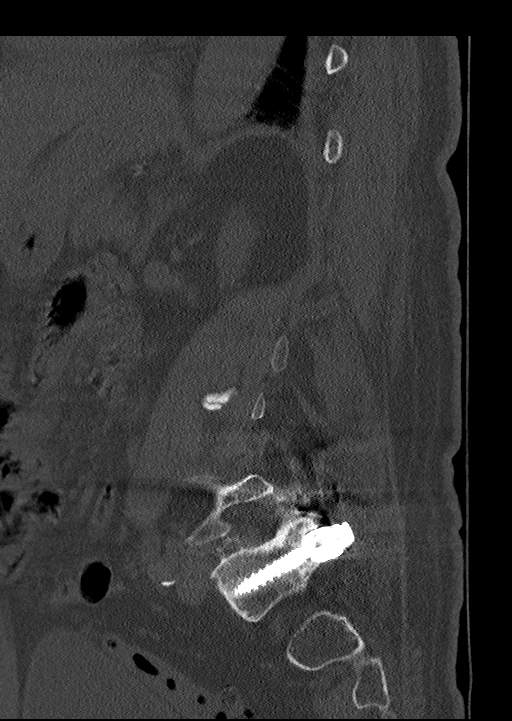

[10 of 33 positions shown; findings below may reference images not displayed]

FINDINGS: LUMBAR MYELOGRAM FINDINGS:

Normal lumbar spine segmentation as demonstrated on the recent
radiographs.

Previous L4-L5 and L5-S1 fusion. Dextroconvex scoliosis. Diffuse
lumbar disc space loss and bulky endplate degenerative spurring.
Mild retrolisthesis at L2-L3 and L3-L4.

Supine radiographs in multiple obliquities demonstrate moderate to
severe attenuation of thecal sac contrast at both the L2-L3 and
L3-L4 levels compatible with spinal stenosis. There is pronounced
vacuum disc phenomena at L2-L3 with bulky endplate spurring.
Bilateral lateral recess stenosis appears more pronounced at L3-L4.

There is also severe chronic disc and endplate degeneration at
T12-L1 and L1-L2 resulting in mass effect on the thecal sac and
stenosis, severe at T12-L1.

Upright lateral views were obtained in neutral, flexion, and
extension.

Mild retrolisthesis at L3-L4 appears stable between the supine
lateral and neutral upright views. No abnormal motion identified in
flexion. The thecal sac patency at each level appears mildly
improved. No abnormal motion identified in extension. Thecal sac
mass effect returns.

Superimposed Calcified aortic atherosclerosis.

CT LUMBAR MYELOGRAM FINDINGS:

Stable lung bases compared to 5706 aside from mild atelectasis.
Chronic Aortoiliac calcified atherosclerosis. Chronic diverticulosis
of the distal colon. Chronic punctate left nephrolithiasis, and
calcified granulomas in the liver and spleen. Postoperative changes
to the posterior paraspinal soft tissues at L5 and S1 with no
adverse features.

Chronic dextroconvex lumbar scoliosis. Mild retrolisthesis at L2-L3
and L3-L4 appears stable since 5706. Chronic anterolisthesis at
L5-S1 is stable. No acute osseous abnormality identified. Chronic
postoperative changes are detailed below. Intact visible sacrum and
SI joints.

T10-T11: Disc space loss with circumferential disc bulge and
broad-based posterior component, but no significant spinal stenosis
(series 5, image 3).

T11-T12: Disc space loss with circumferential disc osteophyte
complex. Broad-based posterior component. Borderline to mild spinal
stenosis.

T12-L1: Vacuum disc has progressed since 5706. Bulky posterior and
cephalad disc extrusion (sagittal image 23 and series 5, image 26).
Broad-based ventral epidural disc along the posterior T12 vertebral
body. Nearer the disc space the disc extrusion is partially
calcified and eccentric to the right with a broad-based right
foraminal component (series 5, image 28). Moderate overall spinal
stenosis at this level with near complete effaced thecal sac
contrast (series 5, image 30). Severe right lateral recess stenosis
and right T12 neural foraminal stenosis. Mild to moderate left side
lateral recess and foraminal stenosis.

L1-L2: Severe chronic disc space loss. Increased vacuum disc. Bulky
circumferential disc osteophyte complex with broad-based posterior
component. Mild to moderate spinal stenosis. Moderate to severe left
L1 foraminal stenosis.

L2-L3: Chronic severe disc space loss and vacuum disc. Bulky
circumferential disc osteophyte complex. Mild to moderate ligament
flavum hypertrophy. Moderate spinal stenosis and right L2 neural
foraminal stenosis. Mild left L2 foraminal stenosis.

L3-L4: Mild retrolisthesis. Severe chronic posterior disc space
loss. Bulky circumferential disc osteophyte complex eccentric to the
left. Superimposed mild to moderate facet and ligament flavum
hypertrophy. Mild to moderate spinal stenosis and bilateral lateral
recess stenosis at this level. Moderate bilateral L3 neural
foraminal stenosis.

L4-L5: Prior decompression and fusion. Bilateral transpedicular
hardware is intact without loosening. Interbody implant in place.
Solid arthrodesis via the right posterior elements. Lesser interbody
arthrodesis. No stenosis.

L5-S1: Prior decompression and fusion. Bilateral transpedicular
hardware without loosening. Interbody implant. Solid-appearing
bilateral posterior element arthrodesis with lesser interbody
arthrodesis. No stenosis.
IMPRESSION: LUMBAR MYELOGRAM IMPRESSION:

1. Chronic dextroconvex lumbar scoliosis with multilevel mild
spondylolisthesis elsewhere in the lumbar spine. No abnormal motion
in flexion extension.
2. Spinal stenosis from T12-L1 to L3-L4.

CT LUMBAR MYELOGRAM IMPRESSION:

1. Since a 5706 CTA of the abdomen pelvis lower spinal disc
degeneration has most progressed at the T12-L1 level. New vacuum
disc, and a bulky cephalad disc extrusion which was not evident on
the prior CTA. See series 9, image 23.
Superimposed more chronic appearing and partially calcified
rightward disc extrusion at the disc space level.

2. Subsequent T12-L1 moderate spinal stenosis with mass effect on
the lower thoracic spinal cord, and severe right lateral recess and
right T12 foraminal stenosis.
3. Severe chronic disc and endplate degeneration L1-L2 through L3-L4
with up to complete loss of the disc spaces, vacuum disc, and bulky
circumferential disc osteophyte complex at each level. Moderate
spinal stenosis at L1-L2 and L2-L3, with mild to moderate spinal
stenosis at L3-L4. Associated bilateral lateral recess stenosis may
be most pronounced at L3-L4.
4. Previous L4-L5 and L5-S1 decompression and fusion with solid
arthrodesis and no hardware loosening.
5.  Aortic Atherosclerosis ([id number]).

## 2018-03-04 ENCOUNTER — Encounter: Payer: Self-pay | Admitting: Cardiology

## 2018-03-08 ENCOUNTER — Other Ambulatory Visit: Payer: Self-pay

## 2018-03-08 ENCOUNTER — Ambulatory Visit
Admission: RE | Admit: 2018-03-08 | Discharge: 2018-03-08 | Disposition: A | Discharge status: Home / Self Care | Payer: Medicare Other | Source: Ambulatory Visit | Attending: Thoracic Surgery (Cardiothoracic Vascular Surgery) | Admitting: Thoracic Surgery (Cardiothoracic Vascular Surgery)

## 2018-03-08 ENCOUNTER — Ambulatory Visit (INDEPENDENT_AMBULATORY_CARE_PROVIDER_SITE_OTHER): Payer: Medicare Other | Admitting: Thoracic Surgery (Cardiothoracic Vascular Surgery)

## 2018-03-08 ENCOUNTER — Telehealth: Payer: Self-pay | Admitting: Cardiology

## 2018-03-08 ENCOUNTER — Encounter: Payer: Self-pay | Admitting: Thoracic Surgery (Cardiothoracic Vascular Surgery)

## 2018-03-08 VITALS — BP 185/98 | HR 72 | Resp 18 | Ht 63.5 in | Wt 138.0 lb

## 2018-03-08 DIAGNOSIS — I711 Thoracic aortic aneurysm, ruptured, unspecified: Secondary | ICD-10-CM

## 2018-03-08 DIAGNOSIS — Z9889 Other specified postprocedural states: Secondary | ICD-10-CM | POA: Diagnosis not present

## 2018-03-08 DIAGNOSIS — I712 Thoracic aortic aneurysm, without rupture, unspecified: Secondary | ICD-10-CM

## 2018-03-08 DIAGNOSIS — Z8679 Personal history of other diseases of the circulatory system: Secondary | ICD-10-CM | POA: Diagnosis not present

## 2018-03-08 MED ORDER — IOPAMIDOL (ISOVUE-370) INJECTION 76%
75.0000 mL | Freq: Once | INTRAVENOUS | Status: AC | PRN
  Administered 2018-03-08: 75 mL via INTRAVENOUS

## 2018-03-08 NOTE — Patient Instructions (Addendum)
Continue all previous medications without any changes at this time  Record your blood pressure daily and discuss whether or not you need to change your medications for high blood pressure with your primary care physician and your cardiologist

## 2018-03-08 NOTE — Progress Notes (Signed)
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 He was last seen here in our office on March 31, 2016 at which time he was doing well.  He returns to our office today for routine scheduled late follow-up CT angiogram.  He states that for the most part he has been doing well for the past 2 years, although he complains that recently he has noticed increasing fatigue, slight intermittent problems with balance, and mild dizzy spells.  He communicated this recently with Dr. Stanford Breed and was instructed to stop taking amlodipine.  He checks his blood pressure regularly at home and states that his blood pressure has been stable.  He notes that he feels most poorly if his systolic blood pressure is less than 120 mmHg.  He denies any fevers, chills, or productive cough.  He reports occasional vague mild discomfort across his chest that is not related to physical exertion.  He reports stable mild symptoms of exertional shortness of breath occur only with strenuous activity.  Appetite is stable.  He has not been gaining or losing weight.   Current Outpatient Medications  Medication Sig Dispense Refill  . ALPRAZolam (XANAX) 0.25 MG tablet Take 1 tablet (0.25 mg total) by mouth 2 (two) times daily as needed for anxiety. 12 tablet 0  . aspirin 81 MG tablet Take 81 mg by mouth at bedtime.     Marland Kitchen atorvastatin (LIPITOR) 10 MG tablet TAKE 1 TABLET BY MOUTH  DAILY 90 tablet 0  . Cholecalciferol  (VITAMIN D3) 2000 units TABS Take 1 tablet by mouth daily.    . Ferrous Sulfate (IRON) 325 (65 FE) MG TABS Take 1 tablet by mouth every other day. Reported on 11/08/2015    . irbesartan (AVAPRO) 150 MG tablet TAKE 1 TABLET BY MOUTH  DAILY 90 tablet 1  . metoprolol tartrate (LOPRESSOR) 50 MG tablet Take 1 tablet (50 mg total) by mouth 2 (two) times daily. 180 tablet 0  . omeprazole (PRILOSEC) 20 MG capsule TAKE 1 CAPSULE BY MOUTH  EVERY OTHER DAY 45 capsule 1  . SYNTHROID 50 MCG tablet TAKE 1 TABLET BY MOUTH  DAILY BEFORE BREAKFAST 90 tablet 0  . triamcinolone cream (KENALOG) 0.1 % Apply 1 application topically 2 (two) times daily. 454 g 0   No current facility-administered medications for this visit.       Physical Exam:   BP (!) 185/98 (BP Location: Right Arm, Patient Position: Sitting, Cuff Size: Normal)   Pulse 72   Resp 18   Ht 5' 3.5" (1.613 m)   Wt 138 lb (62.6 kg)   SpO2 98% Comment: RA  BMI 24.06 kg/m   General:  Well-appearing  Chest:   Clear to auscultation  CV:   Regular rate and rhythm  Incisions:  n/a  Abdomen:  Soft nontender  Extremities:  Warm and well-perfused  Diagnostic Tests:  CT ANGIOGRAPHY CHEST WITH CONTRAST  TECHNIQUE: Multidetector CT imaging of the chest was performed using the standard protocol during bolus administration of intravenous contrast. Multiplanar CT image reconstructions and MIPs were obtained to evaluate the vascular anatomy.  CONTRAST:  2mL ISOVUE-370 IOPAMIDOL (ISOVUE-370) INJECTION 76%  COMPARISON:  CT scan of April 15, 2017.  FINDINGS: Cardiovascular: Status post surgical repair of ascending thoracic aortic aneurysm. No aneurysm or dissection is noted currently. Atherosclerosis of thoracic aorta is noted. No pericardial effusion is noted. Mild coronary artery calcifications are noted. Great vessels are widely patent.  Mediastinum/Nodes: Thyroid gland and esophagus are unremarkable. Stable 15 mm precarinal lymph node  is noted.  Lungs/Pleura: No pneumothorax or pleural effusion is noted. Stable bibasilar reticular densities are noted concerning for scarring or possibly fibrosis.  Upper Abdomen: No acute abnormality.  Musculoskeletal: No chest wall abnormality. No acute or significant osseous findings.  Review of the MIP images confirms the above findings.  IMPRESSION: Status post surgical repair of ascending thoracic aortic aneurysm. No aneurysm or dissection is noted currently.  Mild coronary artery calcifications are noted suggesting coronary artery disease.  Stable findings consistent with scarring or fibrosis in both lung bases.  Aortic Atherosclerosis (ICD10-I70.0).   Electronically Signed   By: Marijo Conception, M.D.   On: 03/08/2018 14:20   Impression:  Stable radiographic appearance of the patient's thoracic aorta now 4 years status post emergent surgical repair.  There have been no interval changes of any significance.  Specifically there is no significant aortic aneurysm no sign of false aneurysm.  Clinically the patient seems to be doing well although his blood pressure is elevated in our office today.  He has been complaining that he has decreased energy and some mild dizzy spells.  His blood pressure medications recently been adjusted by Dr. Stanford Breed.  Plan:  I have encouraged the patient to continue to monitor his blood pressure closely and to schedule a follow-up appointment with Dr. Stanford Breed as soon as practical.  I do not feel that the patient requires any further long-term imaging tests or follow-up related to his previous history of repair of ruptured mycotic aneurysm.  In the future he will call and return to see [place] only should specific problems or questions arise.  All of his questions have been answered.  I spent in excess of 15 minutes during the conduct of this office consultation and >50% of this time involved direct face-to-face encounter with the patient  for counseling and/or coordination of their care.   Valentina Gu. Roxy Manns, MD 03/08/2018 3:12 PM

## 2018-03-08 NOTE — Telephone Encounter (Addendum)
New message    Patient states he feels weak, often times feels faint started about  2 weeks ago. Patient declined to schedule with APP staff ,requesting Dr Stanford Breed only. No chest pain No  SOB

## 2018-03-08 NOTE — Telephone Encounter (Signed)
Spoke with pt, he reports for the last 3 weeks when he first gets up in the morning after about 1 hour he will feel woozy and faint. He has not passed out. He reports this is after taking his medications. He takes the avapro in the morning and on his own he reduced the lopressor to 50 mg once daily at bedtime. He reports when he takes his bp during this time it is 112 to 115 that he feels is quit low. This morning during the woozy feeling his bp was 135/75. He also reports increased fatigue in the last 2-3 weeks as well. This is new for him. Encouraged the patient to cut the lopressor in 1/2 and take it twice daily instead of deleting a dose. His amlodipine was recently stopped. He did not want an appointment to see an extender, he wanted me to let dr Stanford Breed know what is going on. Will forward for dr Stanford Breed review

## 2018-03-09 MED ORDER — IRBESARTAN 150 MG PO TABS: 75.0000 mg | ORAL_TABLET | Freq: Every day | ORAL | 1 refills | Status: DC

## 2018-03-09 NOTE — Telephone Encounter (Signed)
DC hydralazine and follow BP Brian Crenshaw  

## 2018-03-09 NOTE — Telephone Encounter (Signed)
Decrease avapro to 75 mg daily and follow BP Kirk Ruths

## 2018-03-09 NOTE — Telephone Encounter (Signed)
Spoke with pt, he takes irbesartan and metoprolol for his blood pressure. Will forward for dr Stanford Breed review

## 2018-03-09 NOTE — Telephone Encounter (Signed)
Spoke with pt, aware to cut the irbesartan in 1/2.

## 2018-03-16 ENCOUNTER — Other Ambulatory Visit: Payer: Self-pay | Admitting: Family Medicine

## 2018-03-16 DIAGNOSIS — E039 Hypothyroidism, unspecified: Secondary | ICD-10-CM

## 2018-03-16 NOTE — Telephone Encounter (Signed)
Is this ok to refill?  

## 2018-04-02 ENCOUNTER — Encounter: Payer: Self-pay | Admitting: Cardiology

## 2018-05-04 DIAGNOSIS — M12819 Other specific arthropathies, not elsewhere classified, unspecified shoulder: Secondary | ICD-10-CM | POA: Insufficient documentation

## 2018-05-04 DIAGNOSIS — M25512 Pain in left shoulder: Secondary | ICD-10-CM | POA: Diagnosis not present

## 2018-05-04 DIAGNOSIS — M19111 Post-traumatic osteoarthritis, right shoulder: Secondary | ICD-10-CM | POA: Diagnosis not present

## 2018-05-04 DIAGNOSIS — M19112 Post-traumatic osteoarthritis, left shoulder: Secondary | ICD-10-CM | POA: Diagnosis not present

## 2018-05-04 DIAGNOSIS — M25511 Pain in right shoulder: Secondary | ICD-10-CM | POA: Diagnosis not present

## 2018-05-04 DIAGNOSIS — M75100 Unspecified rotator cuff tear or rupture of unspecified shoulder, not specified as traumatic: Secondary | ICD-10-CM | POA: Insufficient documentation

## 2018-05-13 DIAGNOSIS — Z23 Encounter for immunization: Secondary | ICD-10-CM | POA: Diagnosis not present

## 2018-05-17 ENCOUNTER — Encounter: Payer: Self-pay | Admitting: *Deleted

## 2018-05-17 ENCOUNTER — Ambulatory Visit (INDEPENDENT_AMBULATORY_CARE_PROVIDER_SITE_OTHER): Payer: Medicare Other | Admitting: Podiatry

## 2018-05-17 DIAGNOSIS — Q828 Other specified congenital malformations of skin: Secondary | ICD-10-CM

## 2018-05-17 DIAGNOSIS — M79675 Pain in left toe(s): Secondary | ICD-10-CM

## 2018-05-17 DIAGNOSIS — B351 Tinea unguium: Secondary | ICD-10-CM | POA: Diagnosis not present

## 2018-05-17 DIAGNOSIS — M79674 Pain in right toe(s): Secondary | ICD-10-CM | POA: Diagnosis not present

## 2018-05-18 ENCOUNTER — Encounter: Payer: Self-pay | Admitting: Family Medicine

## 2018-05-19 ENCOUNTER — Ambulatory Visit (INDEPENDENT_AMBULATORY_CARE_PROVIDER_SITE_OTHER): Payer: Medicare Other | Admitting: Family Medicine

## 2018-05-19 ENCOUNTER — Encounter: Payer: Self-pay | Admitting: Family Medicine

## 2018-05-19 ENCOUNTER — Other Ambulatory Visit: Payer: Self-pay | Admitting: Family Medicine

## 2018-05-19 VITALS — BP 160/90 | HR 60 | Ht 63.5 in | Wt 138.6 lb

## 2018-05-19 DIAGNOSIS — K219 Gastro-esophageal reflux disease without esophagitis: Secondary | ICD-10-CM | POA: Diagnosis not present

## 2018-05-19 DIAGNOSIS — J3081 Allergic rhinitis due to animal (cat) (dog) hair and dander: Secondary | ICD-10-CM | POA: Diagnosis not present

## 2018-05-19 DIAGNOSIS — I10 Essential (primary) hypertension: Secondary | ICD-10-CM

## 2018-05-19 DIAGNOSIS — L509 Urticaria, unspecified: Secondary | ICD-10-CM

## 2018-05-19 DIAGNOSIS — J3089 Other allergic rhinitis: Secondary | ICD-10-CM | POA: Diagnosis not present

## 2018-05-19 DIAGNOSIS — H1045 Other chronic allergic conjunctivitis: Secondary | ICD-10-CM | POA: Diagnosis not present

## 2018-05-19 DIAGNOSIS — J301 Allergic rhinitis due to pollen: Secondary | ICD-10-CM | POA: Diagnosis not present

## 2018-05-19 DIAGNOSIS — R079 Chest pain, unspecified: Secondary | ICD-10-CM

## 2018-05-19 NOTE — Patient Instructions (Signed)
  It is very important that you eliminate all peanuts and peanut products from your diet.  Please mention this to your allergist, if she hasn't already done so, she can do testing for this.  If you are developing hives, that is a more serious type of allergic reaction, that potentially can develop into anaphylaxis (where you can't breathe), so please avoid all peanuts.  Take claritin or zyrtec once daily, which should help resolve the hives. If you develop a more serious reaction, and the claritin/zyrtec didn't work, then also take benadryl, and seek medical care.  Increase omeprazole to 40mg  once daily (take 2 of your omeprazole 20mg  that you have at home), and continue to avoid spicy, acidic foods, caffeine, alcohol.  Return next week for follow-up if not improving; seek more immediate care if getting worse.

## 2018-05-19 NOTE — Progress Notes (Signed)
Chief Complaint  Patient presents with  . Gastroesophageal Reflux    takes omeprazole for reflux, was symptom free until 3-4 days ago and now is having very reflux. Has been taking omeprazole daily instead of every other day, OTC antacid liquid, TUMS and nothing is helping. It's to the point where any eating brings this on.   . Urticaria    he thinks its unrelated but has noticed some hives on his arms in the past few days-wonders if he may have a peanut allergy?   "I feel rotten" for the last couple of days. He is having discomfort across his entire lower chest, described as squeezing.  This started 2 days ago, during the day, middle of the day.  He took antacid tablets, didn't help.  He took an extra omeprazole, which didn't seem to help. Then took a liquid antacid, which did seem to help for the rest of the day.  He now realizes that the pain recurs about 30 minutes after eating.  He hasn't eaten anything yet today, and only notes minor discomfort.  Denies any change in diet at all (if anything, a little less alcohol). Denies any NSAIDs or aspirin (just the baby aspirin). Takes omeprazole qod (alternating with iron qod), long-term. He did take one yesterday (rather than skipping).   Denies any symptoms up into his chest, no heartburn.   Appetite has been good. No nausea, vomiting, bowel changes.  His other concern is hives. He thinks he has always had a minor peanut allergy, but thinks it has gotten worse.  He has noted some hives recently.  He eats peanuts daily, not always a lot.  He has noted the hives in the last month or two, gets about once a week or less.  He got the hives when he actually ate peanuts (vs something that may contain peanuts). He is under the care of an allergist but has not mentioned this to her.  BP's are 135-140/80's at home. He denies headaches, dizziness, exertional chest pain or shortness of breath  PMH, PSH, SH reviewed  Outpatient Encounter Medications as of  05/19/2018  Medication Sig Note  . ALPRAZolam (XANAX) 0.25 MG tablet Take 1 tablet (0.25 mg total) by mouth 2 (two) times daily as needed for anxiety.   Marland Kitchen aspirin 81 MG tablet Take 81 mg by mouth at bedtime.  09/07/2017: Daily   . Cholecalciferol (VITAMIN D3) 2000 units TABS Take 1 tablet by mouth daily.   . Ferrous Sulfate (IRON) 325 (65 FE) MG TABS Take 1 tablet by mouth every other day. Reported on 11/08/2015   . irbesartan (AVAPRO) 150 MG tablet Take 0.5 tablets (75 mg total) by mouth daily.   . metoprolol tartrate (LOPRESSOR) 50 MG tablet Take 1 tablet (50 mg total) by mouth 2 (two) times daily.   Marland Kitchen omeprazole (PRILOSEC) 20 MG capsule TAKE 1 CAPSULE BY MOUTH  EVERY OTHER DAY   . SYNTHROID 50 MCG tablet TAKE 1 TABLET BY MOUTH  DAILY BEFORE BREAKFAST   . [DISCONTINUED] atorvastatin (LIPITOR) 10 MG tablet TAKE 1 TABLET BY MOUTH  DAILY   . triamcinolone cream (KENALOG) 0.1 % Apply 1 application topically 2 (two) times daily. (Patient not taking: Reported on 05/19/2018)    No facility-administered encounter medications on file as of 05/19/2018.    No Known Allergies  ROS: no fever, chills, URI symptoms, cough, shortness of breath, nausea, vomiting, bowel changes, abdominal pain.  Chest pain and hives per HPI.    PHYSICAL EXAM:  BP (!) 160/90 (BP Location: Right Arm, Cuff Size: Normal)   Pulse 60   Ht 5' 3.5" (1.613 m)   Wt 138 lb 9.6 oz (62.9 kg)   BMI 24.17 kg/m    Well appearing, pleasant, elderly male in no distress HEENT: conjunctiva and sclera are clear, EOMI, OP clear Neck: no lymphadenopathy or mass Heart: regular rate and rhythm, no murmur Lungs: clear bilaterally Chest: nontender to palpation Abdomen: soft, nontender, no organomegaly or mass.   Extremities: no edema.  Left elbow--raise urticarial lesion (not currently itchy). Remainder of skin is normal Neuro: alert and oriented, cranial nerves intact, normal strength, gait Psych: normal mood, affect, hygiene and  grooming  EKG: 1st degree AV block. Prolonged QT, otherwise same as 09/2017   ASSESSMENT/PLAN:  Chest pain, unspecified type - responded to liquid antacids, and today is related to eating, but o/w doesn't sound c/w GERD. Will try increasing omeprazole to 40mg  and see if helps. - Plan: EKG 12-Lead  Essential hypertension - BP's better at home, elevated here - Plan: EKG 12-Lead  Gastroesophageal reflux disease, esophagitis presence not specified - Plan: EKG 12-Lead  Urticaria - minor, discussed use of antihistamines prn. Concerning that he isn't avoiding peanuts, if is potential cause--risks of potential more serious rxn discussed.   May need further w/u (CXR vs other imaging) if chest pain isn't improving with the daily 40mg  omeprazole.  Doesn't seem to be escalating.    Counseled re: risks of anaphylaxis. Avoid all peanuts. Take antihistamines prn hives. F/u with allergist for further discussion/testing (offered here to do serum testing, but since he sees allergist, recommended he discuss with her).   Increase omeprazole to 40mg  once daily for 1-2 weeks, but contact us sooner if not improving.   It is very important that you eliminate all peanuts and peanut products from your diet.  Please mention this to your allergist, if she hasn't already done so, she can do testing for this.  If you are developing hives, that is a more serious type of allergic reaction, that potentially can develop into anaphylaxis (where you can't breathe), so please avoid all peanuts.  Take claritin or zyrtec once daily, which should help resolve the hives. If you develop a more serious reaction, and the claritin/zyrtec didn't work, then also take benadryl, and seek medical care.

## 2018-05-20 ENCOUNTER — Encounter: Payer: Self-pay | Admitting: Family Medicine

## 2018-05-20 NOTE — Progress Notes (Signed)
Subjective: 84 y.o. returns the office today for concerns of painful calluses to both of her feet as well as for painful, elongated, thickened toenails which they cannot trim themself. Denies any redness or drainage around the nails. Denies any acute changes since last appointment and no new complaints today. Denies any systemic complaints such as fevers, chills, nausea, vomiting.   PCP: Rita Ohara, MD   Objective: AAO 3, NAD DP/PT pulses palpable, CRT less than 3 seconds Nails hypertrophic, dystrophic, elongated, brittle, discolored 10. There is tenderness overlying the nails 1-5 bilaterally. There is no surrounding erythema or drainage along the nail sites. Hyperkeratotic lesions present to left heel as well as submetatarsal bilaterally.  Upon debridement there is no underlying ulceration, drainage or any signs of infection noted today. No open lesions or pre-ulcerative lesions are identified. No other areas of tenderness bilateral lower extremities. No overlying edema, erythema, increased warmth. No pain with calf compression, swelling, warmth, erythema.  Assessment: Patient presents with symptomatic onychomycosis  Plan: -Treatment options including alternatives, risks, complications were discussed -Nails sharply debrided 10 without complication/bleeding. -Hyperkeratotic lesions sharply debrided x 3 without any complications or bleeding.  -Discussed daily foot inspection. If there are any changes, to call the office immediately.  -Follow-up in 3 months or sooner if any problems are to arise. In the meantime, encouraged to call the office with any questions, concerns, changes symptoms.  Celesta Gentile, DPM

## 2018-05-25 NOTE — Progress Notes (Signed)
Chief Complaint  Patient presents with  . Follow-up    chest pain, denies chest pain today     Patient presents for 1 week follow-up on chest pain. His omeprazole dose was increased from 20mg  qod to 40mg  once daily since his last visit. He felt back to normal by the end of the day he last saw me.  He hasn't had any further issues with chest pain or discomfort, no heartburn.  He saw Dr. Orvil Feil in f/u, who reported that he has had negative peanut allergy testing twice in the past.  She did not feel like his lesions were hives, more consistent with insect bites. (he had given history of them moving/coming and going, but apparently this wasn't the case).  He hadn't recalled having peanut allergy testing in the past. Skin lesions/insect bites have resolved. He feels good today  BP at home runs 140-145/80-90 at home, reports it is always higher here. No headaches, dizziness.  PMH, PSH, SH reviewed  Outpatient Encounter Medications as of 05/26/2018  Medication Sig Note  . ALPRAZolam (XANAX) 0.25 MG tablet Take 1 tablet (0.25 mg total) by mouth 2 (two) times daily as needed for anxiety.   Marland Kitchen aspirin 81 MG tablet Take 81 mg by mouth at bedtime.  09/07/2017: Daily   . atorvastatin (LIPITOR) 10 MG tablet TAKE 1 TABLET BY MOUTH  DAILY   . Cholecalciferol (VITAMIN D3) 2000 units TABS Take 1 tablet by mouth daily.   . Ferrous Sulfate (IRON) 325 (65 FE) MG TABS Take 1 tablet by mouth every other day. Reported on 11/08/2015   . irbesartan (AVAPRO) 150 MG tablet Take 0.5 tablets (75 mg total) by mouth daily.   Marland Kitchen omeprazole (PRILOSEC) 20 MG capsule TAKE 1 CAPSULE BY MOUTH  EVERY OTHER DAY   . SYNTHROID 50 MCG tablet TAKE 1 TABLET BY MOUTH  DAILY BEFORE BREAKFAST   . triamcinolone cream (KENALOG) 0.1 % Apply 1 application topically 2 (two) times daily.   . [DISCONTINUED] metoprolol tartrate (LOPRESSOR) 50 MG tablet Take 1 tablet (50 mg total) by mouth 2 (two) times daily.    No facility-administered encounter  medications on file as of 05/26/2018.    (taking 40mg  omeprazole currently)  No Known Allergies  ROS: no fever, chills, headaches, dizziness, chest pain, heartburn, shortness of breath, skin rashes, bleeding, bruising, URI symptoms or other complaints   PHYSICAL EXAM:  BP (!) 160/90   Pulse (!) 59   Temp 98.2 F (36.8 C) (Oral)   Ht 5\' 4"  (1.626 m)   Wt 140 lb 12.8 oz (63.9 kg)   SpO2 97%   BMI 24.17 kg/m   142/94 on repeat by MD  Well appearing, pleasant, elderly male in no distress HEENT: conjunctiva and sclera are clear, EOMI Neck :no lymphadenopathy, thyromegaly or mass Heart: regular rate and rhythm Lungs: clear bilaterally Abdomen: soft, nontender, no organomegaly or mass Extremities: no edema Skin: normal turgor, no rash Psych: normal mood, affect, hygiene and grooming Neuro: alert and oriented, cranial nerves intact, normal gait.  ASSESSMENT/PLAN:  Gastroesophageal reflux disease, esophagitis presence not specified - chest pain resolved with higher dose of PPI.  Can taper back to prior dose, and increase if/when has recurrent symptoms.  Essential hypertension - elevated today, borderline at home.  f/u as planned next month to further discuss. cont home monitoring.    Tomorrow you can go back to taking omeprazole 20mg  just 1 capsule daily.  If you have no further recurrent symptoms, you can go  back to your usual regimen of every other day in another week or two. Bump the dose back up if/when you have any recurrent symptoms, and try and look for triggers.

## 2018-05-26 ENCOUNTER — Other Ambulatory Visit: Payer: Self-pay | Admitting: Family Medicine

## 2018-05-26 ENCOUNTER — Encounter: Payer: Self-pay | Admitting: Family Medicine

## 2018-05-26 ENCOUNTER — Ambulatory Visit (INDEPENDENT_AMBULATORY_CARE_PROVIDER_SITE_OTHER): Payer: Medicare Other | Admitting: Family Medicine

## 2018-05-26 VITALS — BP 142/94 | HR 59 | Temp 98.2°F | Ht 64.0 in | Wt 140.8 lb

## 2018-05-26 DIAGNOSIS — K219 Gastro-esophageal reflux disease without esophagitis: Secondary | ICD-10-CM

## 2018-05-26 DIAGNOSIS — I10 Essential (primary) hypertension: Secondary | ICD-10-CM

## 2018-05-26 NOTE — Telephone Encounter (Signed)
Is this appropriate?  

## 2018-05-26 NOTE — Patient Instructions (Signed)
  Tomorrow you can go back to taking omeprazole 20mg  just 1 capsule daily.  If you have no further recurrent symptoms, you can go back to your usual regimen of every other day in another week or two. Bump the dose back up if/when you have any recurrent symptoms, and try and look for triggers.

## 2018-06-15 ENCOUNTER — Other Ambulatory Visit: Payer: Medicare Other

## 2018-06-15 DIAGNOSIS — Z5181 Encounter for therapeutic drug level monitoring: Secondary | ICD-10-CM

## 2018-06-15 DIAGNOSIS — R7301 Impaired fasting glucose: Secondary | ICD-10-CM

## 2018-06-15 LAB — COMPREHENSIVE METABOLIC PANEL
A/G RATIO: 1.4 (ref 1.2–2.2)
ALBUMIN: 4.1 g/dL (ref 3.5–4.7)
ALK PHOS: 82 IU/L (ref 39–117)
ALT: 16 IU/L (ref 0–44)
AST: 23 IU/L (ref 0–40)
BILIRUBIN TOTAL: 0.4 mg/dL (ref 0.0–1.2)
BUN / CREAT RATIO: 15 (ref 10–24)
BUN: 16 mg/dL (ref 8–27)
CO2: 25 mmol/L (ref 20–29)
Calcium: 10 mg/dL (ref 8.6–10.2)
Chloride: 98 mmol/L (ref 96–106)
Creatinine, Ser: 1.05 mg/dL (ref 0.76–1.27)
GFR calc non Af Amer: 66 mL/min/{1.73_m2} (ref >59)
GFR, EST AFRICAN AMERICAN: 76 mL/min/{1.73_m2} (ref >59)
GLUCOSE: 107 mg/dL — AB (ref 65–99)
Globulin, Total: 3 g/dL (ref 1.5–4.5)
POTASSIUM: 4.5 mmol/L (ref 3.5–5.2)
Sodium: 140 mmol/L (ref 134–144)
TOTAL PROTEIN: 7.1 g/dL (ref 6.0–8.5)

## 2018-06-15 LAB — HEMOGLOBIN A1C
ESTIMATED AVERAGE GLUCOSE: 126 mg/dL
Hgb A1c MFr Bld: 6 % — ABNORMAL HIGH (ref 4.8–5.6)

## 2018-06-15 NOTE — Progress Notes (Signed)
Chief Complaint  Patient presents with  . Hypertension    nonfasting med check, labs already done. Is getting up 2-3 times per nigh to to urinate.    Last week out of the blue he developed headaches and stiff neck.  He took tylenol for a few days, which helped with the headache, but not the neck. He took naproxen for 2-3 days and neck stiffness resolved.  He is now back at his baseline.  It was bad enough he couldn't drive last week. He is asking about take naproxen when needed.  Admits that when things happens, his mind goes wild with worry, imagination, perseverates.  Hypertension: BP's have been running frequently mid-high 130's-152, but a few sporadic <110, and about 1/3rd run 110-130. Diastolic 09-811, mostly 91-47.  Machine's last 30 test average is 135/84.  Pulse 50-70 (average 59 over last 30 tests).  Chart reviewed to understand med changes made by cardiologist since his last visit here. Amlodipine was stopped by Dr. Stanford Breed 7/4.  He had been having a lot of dizzy spells with lower blood pressures in the mornings.  At that time he was taking 150 of irbesartant and 30m of metoprolol only once daily (pt decreased from BID on his own). 7/8 he called again, feeling woozy and faint in the mornings. Was told to take 1/2 lopressor BID rather than 1 in the evening and to cut the avapro in half.   Avapro was cut in half and amlodipine 598mwas stopped all in the same week in early July.  No dizziness since then, but as reported above, BPs are higher.  He denies headaches (other than recently with neck pain), chest pain, DOE. He denies edema or side effects.  Hyperlipidemia--toleratingatorvastatin without side effects. He follows a lowfat, low cholesterol diet.   Hypothyroidism: Denies any symptoms--no changes in weight, moods, hair/skin. Bowels are normal.  Some mild decrease in energy over time, just feels like he is "slowing down" some, related to age. He walks 30 minutes rather than an  hour, and only goes to the gym 2x/week rather than 3. He is compliant with taking his thyroid medication on an empty stomach, separate from other medications.  Lab Results  Component Value Date   TSH 2.100 12/09/2017   GERD:  Seen recently with reflux symptoms when taking prilosec 2075mod. Dose was increased to 58m58mily, and symptoms immediately resolved.  He is now back to taking 20mg77mry other day, and denies any GI complaints.  Impaired fasting glucose--mildly elevated at 103 on labs six months ago. Doesn't drink soda, doesn't add sugar to his tea. +cookies/sweets, tries to eat less.  Low Vitamin D level, noted to be 26 in February, 2018. He has been taking 2000 IU of vitamin D3 daily and last level was normal in April 2019 (40.9)  Allergies:  Under the care of Dr. WhelaOrvil Feilnt off allergy shots last year, after 5 years of therapy, and doing well. Starting to have a runny nose.  Up 2-3 times/night to void.  Per CPE 6 months ago, was getting up twice a night then (11/2017).  It is tolerable  PMH, PSH, SH reviewed  Outpatient Encounter Medications as of 06/17/2018  Medication Sig Note  . aspirin 81 MG tablet Take 81 mg by mouth at bedtime.  09/07/2017: Daily   . atorvastatin (LIPITOR) 10 MG tablet TAKE 1 TABLET BY MOUTH  DAILY   . Cholecalciferol (VITAMIN D3) 2000 units TABS Take 1 tablet by mouth daily.   .Marland Kitchen  Ferrous Sulfate (IRON) 325 (65 FE) MG TABS Take 1 tablet by mouth every other day. Reported on 11/08/2015   . irbesartan (AVAPRO) 150 MG tablet Take 0.5 tablets (75 mg total) by mouth daily.   . metoprolol tartrate (LOPRESSOR) 50 MG tablet TAKE 1 TABLET BY MOUTH TWO  TIMES DAILY   . omeprazole (PRILOSEC) 20 MG capsule TAKE 1 CAPSULE BY MOUTH  EVERY OTHER DAY   . SYNTHROID 50 MCG tablet TAKE 1 TABLET BY MOUTH  DAILY BEFORE BREAKFAST   . triamcinolone cream (KENALOG) 0.1 % Apply 1 application topically 2 (two) times daily. (Patient not taking: Reported on 06/17/2018)   .  [DISCONTINUED] ALPRAZolam (XANAX) 0.25 MG tablet Take 1 tablet (0.25 mg total) by mouth 2 (two) times daily as needed for anxiety.    No facility-administered encounter medications on file as of 06/17/2018.    No Known Allergies   ROS:  No headaches (just recent with neck pain), chest pain, shortness of breath, URI symptoms, cough, shortness of breath.  Denies hair/skin/nail/bowel changes.  Moods are good, admits to some anxiety.  Some runny nose recently.  No rashes or skin concerns.   PHYSICAL EXAM:  BP (!) 160/94   Pulse 60   Ht _0  (1.626 m)   Wt 140 lb 9.6 oz (63.8 kg)   BMI 24.13 kg/m    154/100 on repeat by MD  Wt Readings from Last 3 Encounters:  06/17/18 140 lb 9.6 oz (63.8 kg)  05/26/18 140 lb 12.8 oz (63.9 kg)  05/19/18 138 lb 9.6 oz (62.9 kg)    Well appearing, pleasant, elderly male in no distress HEENT: conjunctiva and sclera are clear, EOMI, OP clear Neck: no lymphadenopathy or mass, no carotid bruit. FROM, nontender Heart: regular rate and rhythm, no murmur Lungs: clear bilaterally Back: no spinal or CVA tenderness Chest: nontender to palpation Abdomen: soft, nontender, no organomegaly or mass.   Extremities: no edema.  Neuro: alert and oriented, cranial nerves intact, normal strength, gait Psych: normal mood, affect, hygiene and grooming     Chemistry      Component Value Date/Time   NA 140 06/15/2018 0811   K 4.5 06/15/2018 0811   CL 98 06/15/2018 0811   CO2 25 06/15/2018 0811   BUN 16 06/15/2018 0811   CREATININE 1.05 06/15/2018 0811   CREATININE 1.26 (H) 10/22/2016 1437      Component Value Date/Time   CALCIUM 10.0 06/15/2018 0811   ALKPHOS 82 06/15/2018 0811   AST 23 06/15/2018 0811   ALT 16 06/15/2018 0811   BILITOT 0.4 06/15/2018 0811     Fasting glu 107  Lab Results  Component Value Date   HGBA1C 6.0 (H) 06/15/2018    ASSESSMENT/PLAN:  Essential hypertension - above goal; increase irbesartan back to full tab and cont to  monitor (other option is restart amlodipine at 2.5m, start with avapro).   Gastroesophageal reflux disease, esophagitis presence not specified - improved, back to baseline qod dosing  Impaired fasting glucose - counseled re: diet, exercise, natural course of illness. No meds at this point  Vitamin D deficiency - continue daily supplements  Hypothyroidism, unspecified type - euthyroid by history, cont current dose, recheck 6 mos (sooner if sx)  Pure hypercholesterolemia - at goal on last check; cont statin  Urinary frequency - only slightly more than 6 mos ago, no sx of infection. counseled re: caffeine, fluid intake/timing - Plan: POCT Urinalysis DIP (Proadvantage Device)  Discussed risks/SE of NSAIDs, to use only  sparingly, prefer tylenol first.  F/u 2 weeks with list of BP's (he seemed confused with med changes over phone, not having paper documentation, so prefers OV to review BP's and make med changes).  F/u 6 mos AWV Fasting labs prior c-met, lipids, TSH, CBC, A1c    Your blood pressures are above goal. You had TWO medication changes in July.  Let's continue metoprolol twice daily. Increase the irbesartan back to the full pill (192m). REMAIN OFF the amlodipine (don't throw it away, in case we change our plans later). Log your blood pressures on the sheet provided and bring it to your next visit in 2 weeks.  Use tylenol preferentially for pain. Use Aleve or advil only sparingly, if inflammation or swelling and tylenol didn't help.  Be sure to take the omeprazole every day while taking the anti-inflammatory medication.

## 2018-06-17 ENCOUNTER — Ambulatory Visit (INDEPENDENT_AMBULATORY_CARE_PROVIDER_SITE_OTHER): Payer: Medicare Other | Admitting: Family Medicine

## 2018-06-17 ENCOUNTER — Encounter: Payer: Self-pay | Admitting: Family Medicine

## 2018-06-17 VITALS — BP 160/94 | HR 60 | Ht 64.0 in | Wt 140.6 lb

## 2018-06-17 DIAGNOSIS — I10 Essential (primary) hypertension: Secondary | ICD-10-CM

## 2018-06-17 DIAGNOSIS — E7800 Pure hypercholesterolemia, unspecified: Secondary | ICD-10-CM

## 2018-06-17 DIAGNOSIS — R7301 Impaired fasting glucose: Secondary | ICD-10-CM | POA: Diagnosis not present

## 2018-06-17 DIAGNOSIS — E559 Vitamin D deficiency, unspecified: Secondary | ICD-10-CM

## 2018-06-17 DIAGNOSIS — E039 Hypothyroidism, unspecified: Secondary | ICD-10-CM

## 2018-06-17 DIAGNOSIS — K219 Gastro-esophageal reflux disease without esophagitis: Secondary | ICD-10-CM

## 2018-06-17 DIAGNOSIS — Z5181 Encounter for therapeutic drug level monitoring: Secondary | ICD-10-CM | POA: Diagnosis not present

## 2018-06-17 DIAGNOSIS — R350 Frequency of micturition: Secondary | ICD-10-CM | POA: Diagnosis not present

## 2018-06-17 NOTE — Progress Notes (Signed)
HPI: FU TAA repair; also with micturition syncope, HTN. Admitted 12/2013 with a contained, ruptured ascending thoracic aortic aneurysm in the setting of streptococcal pneumoniae bacteremia/aortitis. He underwent repair of the ascending thoracic aortic aneurysm with a 32 mm Hemashield straight graft. He was also followed by infectious disease and treated with antibiotics. The patient was noted to have clot in the main pulmonary artery. This was likely related to compression of the pulmonary artery by the false aneurysm. He was treated with Coumadin. Echocardiogram 7/15 showed normal LV function, mild MR and mild RVE.Monitor August 2018 showed sinus bradycardia to sinus rhythm with PACs and PVCs. CTA 7/19 showed stable repair of TAA. Since last seen,  patient denies dyspnea, chest pain.  He has had occasional dizzy spells.  One was immediately after standing up causing him to fall.  However he has had other episodes while sitting down.  There is transient dizziness for less than 30 seconds that resolved spontaneously.  No associated chest pain, nausea, dyspnea.  No syncope.  Current Outpatient Medications  Medication Sig Dispense Refill  . aspirin 81 MG tablet Take 81 mg by mouth at bedtime.     Marland Kitchen atorvastatin (LIPITOR) 10 MG tablet TAKE 1 TABLET BY MOUTH  DAILY 90 tablet 0  . Cholecalciferol (VITAMIN D3) 2000 units TABS Take 1 tablet by mouth daily.    . Ferrous Sulfate (IRON) 325 (65 FE) MG TABS Take 1 tablet by mouth every other day. Reported on 11/08/2015    . irbesartan (AVAPRO) 150 MG tablet Take 0.5 tablets (75 mg total) by mouth daily. (Patient taking differently: Take 150 mg by mouth daily. ) 90 tablet 1  . metoprolol tartrate (LOPRESSOR) 50 MG tablet TAKE 1 TABLET BY MOUTH TWO  TIMES DAILY 180 tablet 0  . omeprazole (PRILOSEC) 20 MG capsule TAKE 1 CAPSULE BY MOUTH  EVERY OTHER DAY 45 capsule 1  . SYNTHROID 50 MCG tablet TAKE 1 TABLET BY MOUTH  DAILY BEFORE BREAKFAST 90 tablet 2  .  triamcinolone cream (KENALOG) 0.1 % Apply 1 application topically 2 (two) times daily. 454 g 0   No current facility-administered medications for this visit.      Past Medical History:  Diagnosis Date  . Allergic conjunctivitis   . Allergic rhinitis, cause unspecified    on allergy shots (Dr. Orvil Feil)  . Atherosclerosis of aorta (HCC)    noted on CT angio of abd/pelvis, in many vessels  . BPH (benign prostatic hypertrophy)   . Diverticulosis of colon 12/09  . Foot drop, right   . GERD (gastroesophageal reflux disease)   . Hearing loss in left ear    both ears now  . History of Clostridium difficile    multiple times 2016--s/p fecal transplant (Dr. Baxter Flattery).  Marland Kitchen Hx of echocardiogram    Echo (03/2014):  Mild LVH, EF 50-55%, no RWMA, Gr 1 DD, mild MR, mild reduced RVSF  . Hypertension   . Internal hemorrhoids   . Iron deficiency anemia, unspecified 6/09  . Mitral valve prolapse    h/o; normal echo 12/2011 with no MVP seen  . 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 Right ventricular dysfunction 01/23/2014   Secondary to obstruction of main pulmonary artery  . Ruptured thoracic aortic aneurysm ([facility]) 01/23/2014  . S/P ascending aortic aneurysm repair 01/24/2014  . Unspecified hypothyroidism     Past Surgical History:  Procedure Laterality Date  . CERVICAL LAMINECTOMY  1972   C5-6  . COLONOSCOPY WITH PROPOFOL  N/A 11/09/2014   Procedure: COLONOSCOPY WITH PROPOFOL;  Surgeon: Jerene Bears, MD;  Location: [facility];  Service: Gastroenterology;  Laterality: N/A;  . ESOPHAGOGASTRODUODENOSCOPY  10/31/08   normal; Dr. Edison Nasuti  . FECAL TRANSPLANT N/A 11/09/2014   Procedure: FECAL TRANSPLANT;  Surgeon: Jerene Bears, MD;  Location: [facility];  Service: Gastroenterology;  Laterality: N/A;  . HEMORRHOIDECTOMY WITH HEMORRHOID BANDING  04/07/13  . hemorroidal banding  04/07/2013   x3-Dr.Eric Redmond Pulling  . PROSTATE SURGERY  2007   photovaporization  . ROTATOR CUFF REPAIR  10/2008   left; Dr.  Onnie Graham  . SPINE SURGERY  2006   L4-5 disk surgery  . SPINE SURGERY  04/2013   L4-5, L5-S1 fusion.  Dr. Christella Noa  . THORACIC AORTIC ANEURYSM REPAIR N/A 01/23/2014   Procedure: THORACIC ASCENDING ANEURYSM REPAIR (AAA);  Surgeon: Rexene Alberts, MD;  Location: [facility];  Service: Open Heart Surgery;  Laterality: N/A;  . TONSILLECTOMY      Social History   Socioeconomic History  . Marital status: Married    Spouse name: Not on file  . Number of children: 2  . Years of education: Not on file  . Highest education level: Not on file  Occupational History  . Not on file  Social Needs  . Financial resource strain: Not on file  . Food insecurity:    Worry: Not on file    Inability: Not on file  . Transportation needs:    Medical: Not on file    Non-medical: Not on file  Tobacco Use  . Smoking status: Former Smoker    Types: Cigarettes    Last attempt to quit: 09/01/1977    Years since quitting: 40.8  . Smokeless tobacco: Never Used  Substance and Sexual Activity  . Alcohol use: Yes    Comment: 2 glasses wine per day  . Drug use: No  . Sexual activity: Not on file  Lifestyle  . Physical activity:    Days per week: Not on file    Minutes per session: Not on file  . Stress: Not on file  Relationships  . Social connections:    Talks on phone: Not on file    Gets together: Not on file    Attends religious service: Not on file    Active member of club or organization: Not on file    Attends meetings of clubs or organizations: Not on file    Relationship status: Not on file  . Intimate partner violence:    Fear of current or ex partner: Not on file    Emotionally abused: Not on file    Physically abused: Not on file    Forced sexual activity: Not on file  Other Topics Concern  . Not on file  Social History Narrative   Lives with wife. 1 daughter in [place], 1 daughter in [place], [place].   5 grandchildren.    Family History  Problem Relation Age of Onset  . Diabetes Mother    . Heart disease Mother        CHF  . Hypertension Mother   . Depression Mother   . Stroke Father   . Parkinsonism Father   . Hypertension Father   . Cancer Brother        bladder cancer  . Cancer Brother        prostate cancer  . Emphysema Brother   . Diabetes Sister   . Colon cancer Neg Hx     ROS: no  fevers or chills, productive cough, hemoptysis, dysphasia, odynophagia, melena, hematochezia, dysuria, hematuria, rash, seizure activity, orthopnea, PND, pedal edema, claudication. Remaining systems are negative.  Physical Exam: Well-developed well-nourished in no acute distress.  Skin is warm and dry.  HEENT is normal.  Neck is supple.  Chest is clear to auscultation with normal expansion.  Cardiovascular exam is regular rate and rhythm.  Abdominal exam nontender or distended. No masses palpated. Extremities show no edema. neuro grossly intact  Electrocardiogram today personally reviewed.  Sinus rhythm at a rate of 57.  Left anterior fascicular block.  First-degree AV block.  Lateral T wave inversion.  A/P  1 thoracic aortic aneurysm repair-most recent CTA showed stable repair.  2 hypertension-blood pressure is elevated.  However he has had near syncopal episodes.  One episode sounded orthostatic but others do not.  Question if he is having bradycardia contributing to dizzy spells.  I will arrange a 30-day event monitor.  Decrease metoprolol from 50 twice daily to 25 mg twice daily.  Increase Avapro to 300 mg daily.  Check potassium and renal function in 1 week.  Follow blood pressure and adjust regimen as needed.  3 hyperlipidemia-continue statin.  4 history of near syncope-etiology unclear.  Question bradycardia mediated.  Plan as outlined under hypertension.  Kirk Ruths, MD

## 2018-06-17 NOTE — Patient Instructions (Addendum)
Your blood pressures are above goal. You had TWO medication changes in July.  Let's continue metoprolol twice daily. Increase the irbesartan back to the full pill (150mg ). REMAIN OFF the amlodipine (don't throw it away, in case we change our plans later). Log your blood pressures on the sheet provided and bring it to your next visit in 2 weeks.  Please try cutting back on fluid intake after 7-8pm, as well as cutting back on caffeine (okay to have some in the morning, but limit it later in the day). Limiting alcohol also helps decrease urinary frequency.   Use tylenol preferentially for pain. Use Aleve or advil only sparingly, if inflammation or swelling and tylenol didn't help.  Be sure to take the omeprazole every day while taking the anti-inflammatory medication.   Prediabetes Prediabetes is the condition of having a blood sugar (blood glucose) level that is higher than it should be, but not high enough for you to be diagnosed with type 2 diabetes. Having prediabetes puts you at risk for developing type 2 diabetes (type 2 diabetes mellitus). Prediabetes may be called impaired glucose tolerance or impaired fasting glucose. Prediabetes usually does not cause symptoms. Your health care provider can diagnose this condition with blood tests. You may be tested for prediabetes if you are overweight and if you have at least one other risk factor for prediabetes. Risk factors for prediabetes include:  Having a family member with type 2 diabetes.  Being overweight or obese.  Being older than age 63.  Being of American-Indian, African-American, Hispanic/Latino, or Asian/Pacific Islander descent.  Having an inactive (sedentary) lifestyle.  Having a history of gestational diabetes or polycystic ovarian syndrome (PCOS).  Having low levels of good cholesterol (HDL-C) or high levels of blood fats (triglycerides).  Having high blood pressure.  What is blood glucose and how is blood glucose  measured?  Blood glucose refers to the amount of glucose in your bloodstream. Glucose comes from eating foods that contain sugars and starches (carbohydrates) that the body breaks down into glucose. Your blood glucose level may be measured in mg/dL (milligrams per deciliter) or mmol/L (millimoles per liter).Your blood glucose may be checked with one or more of the following blood tests:  A fasting blood glucose (FBG) test. You will not be allowed to eat (you will fast) for at least 8 hours before a blood sample is taken. ? A normal range for FBG is 70-100 mg/dl (3.9-5.6 mmol/L).  An A1c (hemoglobin A1c) blood test. This test provides information about blood glucose control over the previous 2?40months.  An oral glucose tolerance test (OGTT). This test measures your blood glucose twice: ? After fasting. This is your baseline level. ? Two hours after you drink a beverage that contains glucose.  You may be diagnosed with prediabetes:  If your FBG is 100?125 mg/dL (5.6-6.9 mmol/L).  If your A1c level is 5.7?6.4%.  If your OGGT result is 140?199 mg/dL (7.8-11 mmol/L).  These blood tests may be repeated to confirm your diagnosis. What happens if blood glucose is too high? The pancreas produces a hormone (insulin) that helps move glucose from the bloodstream into cells. When cells in the body do not respond properly to insulin that the body makes (insulin resistance), excess glucose builds up in the blood instead of going into cells. As a result, high blood glucose (hyperglycemia) can develop, which can cause many complications. This is a symptom of prediabetes. What can happen if blood glucose stays higher than normal for a long  time? Having high blood glucose for a long time is dangerous. Too much glucose in your blood can damage your nerves and blood vessels. Long-term damage can lead to complications from diabetes, which may include:  Heart disease.  Stroke.  Blindness.  Kidney  disease.  Depression.  Poor circulation in the feet and legs, which could lead to surgical removal (amputation) in severe cases.  How can prediabetes be prevented from turning into type 2 diabetes?  To help prevent type 2 diabetes, take the following actions:  Be physically active. ? Do moderate-intensity physical activity for at least 30 minutes on at least 5 days of the week, or as much as told by your health care provider. This could be brisk walking, biking, or water aerobics. ? Ask your health care provider what activities are safe for you. A mix of physical activities may be best, such as walking, swimming, cycling, and strength training.  Lose weight as told by your health care provider. ? Losing 5-7% of your body weight can reverse insulin resistance. ? Your health care provider can determine how much weight loss is best for you and can help you lose weight safely.  Follow a healthy meal plan. This includes eating lean proteins, complex carbohydrates, fresh fruits and vegetables, low-fat dairy products, and healthy fats. ? Follow instructions from your health care provider about eating or drinking restrictions. ? Make an appointment to see a diet and nutrition specialist (registered dietitian) to help you create a healthy eating plan that is right for you.  Do not smoke or use any tobacco products, such as cigarettes, chewing tobacco, and e-cigarettes. If you need help quitting, ask your health care provider.  Take over-the-counter and prescription medicines as told by your health care provider. You may be prescribed medicines that help lower the risk of type 2 diabetes.  This information is not intended to replace advice given to you by your health care provider. Make sure you discuss any questions you have with your health care provider. Document Released: 12/10/2015 Document Revised: 01/24/2016 Document Reviewed: 10/09/2015 Elsevier Interactive Patient Education  [employer].

## 2018-06-28 ENCOUNTER — Ambulatory Visit (INDEPENDENT_AMBULATORY_CARE_PROVIDER_SITE_OTHER): Payer: Medicare Other | Admitting: Cardiology

## 2018-06-28 ENCOUNTER — Encounter: Payer: Self-pay | Admitting: Cardiology

## 2018-06-28 VITALS — BP 148/90 | HR 63 | Ht 64.0 in | Wt 140.0 lb

## 2018-06-28 DIAGNOSIS — E7800 Pure hypercholesterolemia, unspecified: Secondary | ICD-10-CM

## 2018-06-28 DIAGNOSIS — I10 Essential (primary) hypertension: Secondary | ICD-10-CM

## 2018-06-28 DIAGNOSIS — R55 Syncope and collapse: Secondary | ICD-10-CM

## 2018-06-28 MED ORDER — IRBESARTAN 300 MG PO TABS: 300.0000 mg | ORAL_TABLET | Freq: Every day | ORAL | 3 refills | Status: DC

## 2018-06-28 MED ORDER — METOPROLOL TARTRATE 25 MG PO TABS: 25.0000 mg | ORAL_TABLET | Freq: Two times a day (BID) | ORAL | 3 refills | Status: DC

## 2018-06-28 NOTE — Patient Instructions (Signed)
Medication Instructions:  DECREASE METOPROLOL TO 25 MG TWICE DAILY= 1/2 OF THE 50 MG TABLET TWICE DAILY  INCREASE IRBESARTAN TO 300 MG ONCE DAILY= 2 OF THE 150 MG TABLETS ONCE DAILY If you need a refill on your cardiac medications before your next appointment, please call your pharmacy.   Lab work: Your physician recommends that you return for lab work in: [place] If you have labs (blood work) drawn today and your tests are completely normal, you will receive your results only by: Marland Kitchen MyChart Message (if you have MyChart) OR . A paper copy in the mail If you have any lab test that is abnormal or we need to change your treatment, we will call you to review the results.  Testing/Procedures: Your physician has recommended that you wear a 30 DAY event monitor. Event monitors are medical devices that record the heart's electrical activity. Doctors most often [place] these monitors to diagnose arrhythmias. Arrhythmias are problems with the speed or rhythm of the heartbeat. The monitor is a small, portable device. You can wear one while you do your normal daily activities. This is usually used to diagnose what is causing palpitations/syncope (passing out).    Follow-Up: Your physician recommends that you schedule a follow-up appointment in: [place]

## 2018-06-30 ENCOUNTER — Other Ambulatory Visit: Payer: Self-pay | Admitting: Family Medicine

## 2018-06-30 NOTE — Progress Notes (Signed)
Chief Complaint  Patient presents with  . Hypertension    2 week follow up     Patient presents for 2 week f/u on hypertension.  He has seen his cardiologist in the interim (10/28, Dr. Stanford Breed), who also made medication adjustments. At his visit with me 2 weeks ago, his irbesartan dose was increased from 75mg  to 150.  Dr. Stanford Breed is arranging for him to have 30-day event monitor, concern for symptomatic bradycardia.  Metoprolol was decreased from 50 twice daily to 25 mg twice daily.  Avapro was increased to 300 mg daily (started 3d ago). Cardiologist has him scheduled to recheck potassium and renal function next week.  Morning BP's have been running 140's (138-150)/84-95, once was 159/102 on 10/19, the day after increasing the irbesartan to 150mg . Afternoon BP's 114-159/68-109 (once was 114/68 yesterday). Has only been on the higher irbesartan x 3 days.  Pulse has been 67-71 over the last 2 days (since metoprolol was lowered). Prior to this was 55-66, mostly mid to upper 50's.  Denies headaches, dizziness, not feeling like he might faint. Once felt dizzy when he stood up too fast after a nap, bruised his right arm.  Feels more unsure of himself on his feet. Harder to get up on the exam table. Has to pay more attention and think more about how to do things.  Has some issues with balance--can no longer balance to put his trousers on, needs to hold onto something.  Going to the gym only 2x/week.  PMH, [place], SH reviewed  Outpatient Encounter Medications as of 07/01/2018  Medication Sig Note  . aspirin 81 MG tablet Take 81 mg by mouth at bedtime.  09/07/2017: Daily   . atorvastatin (LIPITOR) 10 MG tablet TAKE 1 TABLET BY MOUTH  DAILY   . Cholecalciferol (VITAMIN D3) 2000 units TABS Take 1 tablet by mouth daily.   . Ferrous Sulfate (IRON) 325 (65 FE) MG TABS Take 1 tablet by mouth every other day. Reported on 11/08/2015   . irbesartan (AVAPRO) 300 MG tablet Take 1 tablet (300 mg total) by  mouth daily.   . metoprolol tartrate (LOPRESSOR) 25 MG tablet Take 1 tablet (25 mg total) by mouth 2 (two) times daily.   Marland Kitchen omeprazole (PRILOSEC) 20 MG capsule TAKE 1 CAPSULE BY MOUTH  EVERY OTHER DAY   . SYNTHROID 50 MCG tablet TAKE 1 TABLET BY MOUTH  DAILY BEFORE BREAKFAST   . triamcinolone cream (KENALOG) 0.1 % Apply 1 application topically 2 (two) times daily.    No facility-administered encounter medications on file as of 07/01/2018.    No Known Allergies  ROS: no fever, chills, headaches, chest pain, palpitations, syncope, URI or allergy symptoms, edema, bleeding, bruising, rash or other complaints, except as noted in HPI     PHYSICAL EXAM:  BP (!) 150/88   Pulse 60   Temp 98.5 F (36.9 C) (Oral)   Ht 5\' 4"  (1.626 m)   Wt 141 lb (64 kg)   SpO2 93%   BMI 24.20 kg/m   152/102 on repeat by MD, right arm  BP Readings from Last 3 Encounters:  06/28/18 (!) 148/90  06/17/18 (!) 160/94  05/26/18 (!) 142/94   Well appearing, pleasant, elderly male, in no distress HEENT: PERRL, EOMI, conjunctiva and sclera are clear Neck: no lymphadenopathy or mass Heart: regular rate and rhythm, no murmur Lungs: clear bilaterally Extremities: No edema Psych: normal mood, affect, hygiene and grooming  ASSESSMENT/PLAN:    Continue on the medications as  per Dr. Leighton Roach changes made today. It is a little too soon to see the full effect of the medication changes, but already the pulse is now in the normal range (since cutting the metoprolol in half).   Refer for PT--to work on balance, gait, overall strength to decrease risk for falls.

## 2018-07-01 ENCOUNTER — Encounter: Payer: Self-pay | Admitting: Family Medicine

## 2018-07-01 ENCOUNTER — Ambulatory Visit (INDEPENDENT_AMBULATORY_CARE_PROVIDER_SITE_OTHER): Payer: Medicare Other | Admitting: Family Medicine

## 2018-07-01 VITALS — BP 150/88 | HR 60 | Temp 98.5°F | Ht 64.0 in | Wt 141.0 lb

## 2018-07-01 DIAGNOSIS — R2689 Other abnormalities of gait and mobility: Secondary | ICD-10-CM | POA: Diagnosis not present

## 2018-07-01 DIAGNOSIS — I10 Essential (primary) hypertension: Secondary | ICD-10-CM

## 2018-07-01 NOTE — Patient Instructions (Addendum)
  Continue on the medications as per Dr. Leighton Roach changes made today. It is a little too soon to see the full effect of the medication changes, but already the pulse is now in the normal range (since cutting the metoprolol in half).  We will be referring you for physical therapy to help with strength and balance.

## 2018-07-02 DIAGNOSIS — R55 Syncope and collapse: Secondary | ICD-10-CM | POA: Diagnosis not present

## 2018-07-02 DIAGNOSIS — I10 Essential (primary) hypertension: Secondary | ICD-10-CM | POA: Diagnosis not present

## 2018-07-03 LAB — BASIC METABOLIC PANEL
BUN / CREAT RATIO: 13 (ref 10–24)
BUN: 15 mg/dL (ref 8–27)
CO2: 25 mmol/L (ref 20–29)
CREATININE: 1.15 mg/dL (ref 0.76–1.27)
Calcium: 10 mg/dL (ref 8.6–10.2)
Chloride: 100 mmol/L (ref 96–106)
GFR, EST AFRICAN AMERICAN: 68 mL/min/{1.73_m2} (ref >59)
GFR, EST NON AFRICAN AMERICAN: 59 mL/min/{1.73_m2} — AB (ref >59)
Glucose: 119 mg/dL — ABNORMAL HIGH (ref 65–99)
Potassium: 4.2 mmol/L (ref 3.5–5.2)
SODIUM: 139 mmol/L (ref 134–144)

## 2018-07-06 ENCOUNTER — Ambulatory Visit (INDEPENDENT_AMBULATORY_CARE_PROVIDER_SITE_OTHER): Payer: Medicare Other

## 2018-07-06 DIAGNOSIS — R55 Syncope and collapse: Secondary | ICD-10-CM

## 2018-07-07 ENCOUNTER — Other Ambulatory Visit: Payer: Self-pay | Admitting: Family Medicine

## 2018-08-11 ENCOUNTER — Encounter: Payer: Self-pay | Admitting: Family Medicine

## 2018-08-11 ENCOUNTER — Ambulatory Visit (INDEPENDENT_AMBULATORY_CARE_PROVIDER_SITE_OTHER): Payer: Medicare Other | Admitting: Family Medicine

## 2018-08-11 ENCOUNTER — Ambulatory Visit
Admission: RE | Admit: 2018-08-11 | Discharge: 2018-08-11 | Disposition: A | Discharge status: Home / Self Care | Payer: Medicare Other | Source: Ambulatory Visit | Attending: Family Medicine | Admitting: Family Medicine

## 2018-08-11 VITALS — BP 170/90 | HR 80 | Temp 96.5°F | Ht 64.0 in | Wt 138.4 lb

## 2018-08-11 DIAGNOSIS — E039 Hypothyroidism, unspecified: Secondary | ICD-10-CM | POA: Diagnosis not present

## 2018-08-11 DIAGNOSIS — R079 Chest pain, unspecified: Secondary | ICD-10-CM

## 2018-08-11 DIAGNOSIS — R5383 Other fatigue: Secondary | ICD-10-CM

## 2018-08-11 DIAGNOSIS — R109 Unspecified abdominal pain: Secondary | ICD-10-CM | POA: Diagnosis not present

## 2018-08-11 DIAGNOSIS — J90 Pleural effusion, not elsewhere classified: Secondary | ICD-10-CM | POA: Diagnosis not present

## 2018-08-11 LAB — POCT URINALYSIS DIP (PROADVANTAGE DEVICE)
Bilirubin, UA: NEGATIVE
GLUCOSE UA: NEGATIVE mg/dL
Ketones, POC UA: NEGATIVE mg/dL
LEUKOCYTES UA: NEGATIVE
NITRITE UA: NEGATIVE
RBC UA: NEGATIVE
SPECIFIC GRAVITY, URINE: 1.015
UUROB: NEGATIVE
pH, UA: 7 (ref 5.0–8.0)

## 2018-08-11 NOTE — Progress Notes (Signed)
Chief Complaint  Patient presents with  . Abdominal Pain    and low back pain as well as chest pain x 1 week. States he has never felt worse in his life. Also has been having black stools on and off for some time now.    About a week ago he felt like acid-reflux feeling came back. He has been having pain across the mid chest (at nipple line, not higher) and across his abdomen at his waist.  He is also having some pain at the right lower back.  He took the prilosec daily for just a few days, has been back to qod.  He still had discomfort for at least a day after taking it daily, but may have helped a little.  "I feel that something is wrong", similar to the feeling he had prior to the issue with his aorta.  He thinks he may have two things going on, one of which may be his reflux.  "My tank is empty"--reminiscent of how he felt 5 years ago. +fatigue.  He has had some black stools off and on, noted black stool this morning.  Did not notice any black stool yesterday. He took a [employer] a couple of nights ago. He has had some black stools on and off, not related to pepto bismol.  He has trouble sleeping at night due to discomfort (in the central back area) for the last couple of weeks.  He was supposed to go to [facility] today, canceled his flight.  He thinks he has some anxiety which prevents him from wanting to travel.  He had been referred for PT for help with gait and stability.  He admits today that he didn't call them back to arrange for this. He continues to feel like he needs something nearby to grab for balance.   PMH, PSH, SH reviewed  Outpatient Encounter Medications as of 08/11/2018  Medication Sig Note  . aspirin 81 MG tablet Take 81 mg by mouth at bedtime.  09/07/2017: Daily   . atorvastatin (LIPITOR) 10 MG tablet TAKE 1 TABLET BY MOUTH  DAILY   . Cholecalciferol (VITAMIN D3) 2000 units TABS Take 1 tablet by mouth daily.   . Ferrous Sulfate (IRON) 325 (65 FE) MG TABS Take 1 tablet  by mouth every other day. Reported on 11/08/2015   . irbesartan (AVAPRO) 300 MG tablet Take 1 tablet (300 mg total) by mouth daily.   . metoprolol tartrate (LOPRESSOR) 25 MG tablet Take 1 tablet (25 mg total) by mouth 2 (two) times daily.   Marland Kitchen omeprazole (PRILOSEC) 20 MG capsule TAKE 1 CAPSULE BY MOUTH  EVERY OTHER DAY   . SYNTHROID 50 MCG tablet TAKE 1 TABLET BY MOUTH  DAILY BEFORE BREAKFAST   . triamcinolone cream (KENALOG) 0.1 % Apply 1 application topically 2 (two) times daily. (Patient not taking: Reported on 08/11/2018)    No facility-administered encounter medications on file as of 08/11/2018.    No Known Allergies  ROS:  Denies fever, chills, dizziness or feeling lightheaded.  Every once in a while he feels slightly lightheaded (rare), not in the last week when feeling worse. Denies nausea, vomiting, no diarrhea.  Black stools per HPI; denies blood or mucus in the stool. Denies URI symptoms, cough, shortness of breath. He has clear runny nose. Denies weight loss, early satiety.  Some decreased appetite in the last 2 days. Took 1/2 xanax a few times in the last week, didn't seem to do anything.  PHYSICAL  EXAM:  BP (!) 170/90   Pulse 80   Temp (!) 96.5 F (35.8 C) (Tympanic)   Ht 5' 4"  (1.626 m)   Wt 138 lb 6.4 oz (62.8 kg)   BMI 23.76 kg/m    Wt Readings from Last 3 Encounters:  08/11/18 138 lb 6.4 oz (62.8 kg)  07/01/18 141 lb (64 kg)  06/28/18 140 lb (63.5 kg)   Elderly male, who appears comfortable, in no distress. HEENT: PERRL, conjunctiva and sclera are clear, EOMI. Sinuses nontender OP clear (upper dentures, normal mucosa) Neck: no lymphadenopathy, thyromegaly or mass. No supraclavicular adenopathy Heart: regular rate and rhythm Lungs: Trace crackles at bases, but more on the right base than left. No wheezes, rales, ronchi Back: no spinal or CVA tenderness Area of discomfort is at the right side, below the lateral ribs. nontender to palpation. No palpable muscle  spasm. No skin abnormality/rash in this area Abdomen: soft, normal bowel sounds, nontender, no mass Rectal: normal sphincter tone, no mass. Prostate is normal, no mass/nodules. Heme negative slightly dark stool Extremities: no edema Psych: normal mood, somewhat anxious, normal hygiene, grooming speech, eye contact Neuro: alert and oriented, cranial nerves intact, normal gait  Urine dip--trace protein, o/w normal.  EKG: NSR, rate 69, unchanged from the one done at cardiologist's office in October (LAD, LAE poss, TW abnl, poss lat ischemia)  ASSESSMENT/PLAN:  Abdominal pain, unspecified abdominal location - exam is benign; area of pain is mostly at right side, muscular area, below flank - Plan: POCT Urinalysis DIP (Proadvantage Device)  Chest pain, unspecified type - complex hx, with h/o aneurysm/infection, but also GERD which may be flaring. check CXR, labs - Plan: DG Chest 2 View, EKG 12-Lead  Fatigue, unspecified type - Plan: Comprehensive metabolic panel, CBC with Differential/Platelet, TSH, EKG 12-Lead  Hypothyroidism, unspecified type - Plan: TSH   CBC, c-met, TSH CXR, EKG   Encouraged to get PT to help with balance/stability.   Addendum: Elevated WBC noted (15.9). No fever or focal sx of infection (just the pain on right side). Chest and abdomen felt better when results d/w patient at 8:45 am 12/12. T bili elevated at 1.9 (prev normal), o/w c-met okay.  TSH normal (2) CXR: IMPRESSION: Mild bibasilar subsegmental atelectasis or scarring is noted with minimal pleural effusions. (this was also noted on CT angio done 03/2018).  Pt to f/u next week, sooner if any worsening symtpoms.

## 2018-08-11 NOTE — Patient Instructions (Addendum)
Go to [facility] ([address]) for the chest x-ray.  Try tylenol if needed for pain. You can also try heating pad and topical medications ([place]) to your right back area.   Consider taking loraditine (claritin) for allergy symptoms (if what you're taking from the allergist is not an antihistamine--you couldn't recall which medicine it was)  Please call physical therapy to schedule your appointment--we want to do all that we can to prevent falls, and help with your balance.

## 2018-08-12 LAB — COMPREHENSIVE METABOLIC PANEL
A/G RATIO: 1.3 (ref 1.2–2.2)
ALT: 14 IU/L (ref 0–44)
AST: 20 IU/L (ref 0–40)
Albumin: 4.3 g/dL (ref 3.5–4.7)
Alkaline Phosphatase: 83 IU/L (ref 39–117)
BUN / CREAT RATIO: 12 (ref 10–24)
BUN: 13 mg/dL (ref 8–27)
Bilirubin Total: 1.9 mg/dL — ABNORMAL HIGH (ref 0.0–1.2)
CO2: 26 mmol/L (ref 20–29)
Calcium: 9.8 mg/dL (ref 8.6–10.2)
Chloride: 93 mmol/L — ABNORMAL LOW (ref 96–106)
Creatinine, Ser: 1.1 mg/dL (ref 0.76–1.27)
GFR calc Af Amer: 72 mL/min/{1.73_m2} (ref >59)
GFR calc non Af Amer: 62 mL/min/{1.73_m2} (ref >59)
GLOBULIN, TOTAL: 3.3 g/dL (ref 1.5–4.5)
Glucose: 105 mg/dL — ABNORMAL HIGH (ref 65–99)
Potassium: 4.7 mmol/L (ref 3.5–5.2)
SODIUM: 135 mmol/L (ref 134–144)
Total Protein: 7.6 g/dL (ref 6.0–8.5)

## 2018-08-12 LAB — TSH: TSH: 2.08 u[IU]/mL (ref 0.450–4.500)

## 2018-08-12 LAB — CBC WITH DIFFERENTIAL/PLATELET
BASOS ABS: 0 10*3/uL (ref 0.0–0.2)
Basos: 0 %
EOS (ABSOLUTE): 0.1 10*3/uL (ref 0.0–0.4)
Eos: 1 %
Hematocrit: 46.9 % (ref 37.5–51.0)
Hemoglobin: 15.7 g/dL (ref 13.0–17.7)
IMMATURE GRANULOCYTES: 0 %
Immature Grans (Abs): 0 10*3/uL (ref 0.0–0.1)
LYMPHS ABS: 1.3 10*3/uL (ref 0.7–3.1)
LYMPHS: 8 %
MCH: 28.2 pg (ref 26.6–33.0)
MCHC: 33.5 g/dL (ref 31.5–35.7)
MCV: 84 fL (ref 79–97)
MONOCYTES: 9 %
Monocytes Absolute: 1.4 10*3/uL — ABNORMAL HIGH (ref 0.1–0.9)
NEUTROS ABS: 13 10*3/uL — AB (ref 1.4–7.0)
Neutrophils: 82 %
Platelets: 339 10*3/uL (ref 150–450)
RBC: 5.57 x10E6/uL (ref 4.14–5.80)
RDW: 13 % (ref 12.3–15.4)
WBC: 15.9 10*3/uL — ABNORMAL HIGH (ref 3.4–10.8)

## 2018-08-13 ENCOUNTER — Encounter: Payer: Self-pay | Admitting: Family Medicine

## 2018-08-14 NOTE — Telephone Encounter (Signed)
Needs to be scheduled for Monday (30 min visit, early afternoon, ie the 1:30 if still available).  I think we likely should look into getting [facility] involved to help, and will clarify their needs at his visit.

## 2018-08-16 ENCOUNTER — Institutional Professional Consult (permissible substitution): Payer: Self-pay | Admitting: Family Medicine

## 2018-08-16 ENCOUNTER — Ambulatory Visit: Payer: Medicare Other | Admitting: Podiatry

## 2018-08-16 ENCOUNTER — Encounter: Payer: Self-pay | Admitting: Family Medicine

## 2018-08-16 ENCOUNTER — Ambulatory Visit (INDEPENDENT_AMBULATORY_CARE_PROVIDER_SITE_OTHER): Payer: Medicare Other | Admitting: Family Medicine

## 2018-08-16 VITALS — BP 170/98 | HR 68 | Ht 64.0 in | Wt 137.6 lb

## 2018-08-16 DIAGNOSIS — F321 Major depressive disorder, single episode, moderate: Secondary | ICD-10-CM

## 2018-08-16 DIAGNOSIS — D72829 Elevated white blood cell count, unspecified: Secondary | ICD-10-CM

## 2018-08-16 LAB — CBC WITH DIFFERENTIAL/PLATELET
BASOS: 0 %
Basophils Absolute: 0 10*3/uL (ref 0.0–0.2)
EOS (ABSOLUTE): 0.1 10*3/uL (ref 0.0–0.4)
EOS: 1 %
HEMATOCRIT: 42.5 % (ref 37.5–51.0)
HEMOGLOBIN: 13.9 g/dL (ref 13.0–17.7)
IMMATURE GRANS (ABS): 0 10*3/uL (ref 0.0–0.1)
IMMATURE GRANULOCYTES: 0 %
LYMPHS: 15 %
Lymphocytes Absolute: 1.4 10*3/uL (ref 0.7–3.1)
MCH: 28.1 pg (ref 26.6–33.0)
MCHC: 32.7 g/dL (ref 31.5–35.7)
MCV: 86 fL (ref 79–97)
MONOCYTES: 10 %
MONOS ABS: 1 10*3/uL — AB (ref 0.1–0.9)
NEUTROS PCT: 74 %
Neutrophils Absolute: 6.9 10*3/uL (ref 1.4–7.0)
Platelets: 372 10*3/uL (ref 150–450)
RBC: 4.94 x10E6/uL (ref 4.14–5.80)
RDW: 12.7 % (ref 12.3–15.4)
WBC: 9.5 10*3/uL (ref 3.4–10.8)

## 2018-08-16 MED ORDER — CITALOPRAM HYDROBROMIDE 20 MG PO TABS: ORAL_TABLET | ORAL | 1 refills | Status: DC

## 2018-08-16 NOTE — Progress Notes (Signed)
Chief Complaint  Patient presents with  . Consult    having some mood issues. Would like to discuss. Also wanted to let you know that his mother spent time in a mental institution and had shock treatments.   . Shortness of Breath    did mention that he forgot to tell you about some SOB. He woke upi the other night gasping for air.     Patient presents for follow-up from last week, with complaint of depression.  No longer having acid reflux, no longer having any chest or abdominal pain (as he did last week). He does still have some pain at the right lower side/back. It comes and goes from day to day, some days he has it, some days he doesn't. Not getting any worse. He wonders if it could be from sitting around a lot more than usual.  He tells me today that he switched his thyroid medication last year to taking it at bedtime, rather than first thing in the morning.   It is still separate from food and other medications. His recent TSH was 2.080 (while taking it this way).  He reports shortness of breath, further described as that he doesn't breathe deeply.  Doesn't have problems with stairs or activity. Denies feeling like he can't catch his breath, thought he reports that just once, this past Saturday night, he woke up gasping. Denies heartburn, bad dream. He woke up because he couldn't catch his breath.  Doesn't recall this happening other times. His wife has mentioned anything to him about apnea or snoring, and he doesn't believe he snores. He hasn't been feeling refreshed in the mornings, more recently only.  He also feels tired during the day, but isn't sure if it could be related to depression.  He thinks he may be depressed.  He thinks Danny Frederick is his problem, she has "drifted quite a bit"--re: cognitive decline, getting harder to live with her. Arguing all the time over nothing. Thinks she is afraid/frustrated, and takes it out on him. The arguing wears him down. He reports feeling "lost and  alone". "I would be happy to not be here", but wouldn't leave his wife alone. "I don't have the courage to do anything", when asked about any plan. Spending more time watching TV, that may contribute to his side pain. "my tank is empty" Can blow things up easily--making mountains out of molehills.  He denies having any guns in the home.    PMH, PSH, SH reviewed  Outpatient Encounter Medications as of 08/16/2018  Medication Sig Note  . aspirin 81 MG tablet Take 81 mg by mouth at bedtime.  09/07/2017: Daily   . atorvastatin (LIPITOR) 10 MG tablet TAKE 1 TABLET BY MOUTH  DAILY   . Cholecalciferol (VITAMIN D3) 2000 units TABS Take 1 tablet by mouth daily.   . citalopram (CELEXA) 20 MG tablet Take 1/2 tablet by mouth once daily. Increase to full tablet after a week, if tolerated   . Ferrous Sulfate (IRON) 325 (65 FE) MG TABS Take 1 tablet by mouth every other day. Reported on 11/08/2015   . irbesartan (AVAPRO) 300 MG tablet Take 1 tablet (300 mg total) by mouth daily.   . metoprolol tartrate (LOPRESSOR) 25 MG tablet Take 1 tablet (25 mg total) by mouth 2 (two) times daily.   Marland Kitchen omeprazole (PRILOSEC) 20 MG capsule TAKE 1 CAPSULE BY MOUTH  EVERY OTHER DAY   . SYNTHROID 50 MCG tablet TAKE 1 TABLET BY MOUTH  DAILY  BEFORE BREAKFAST   . triamcinolone cream (KENALOG) 0.1 % Apply 1 application topically 2 (two) times daily. (Patient not taking: Reported on 08/11/2018)    No facility-administered encounter medications on file as of 08/16/2018.    (NOT taking citalopram prior to today's visit)  No Known Allergies  ROS:  Fatigue, depression, side pain per HPI.  Denies fever, chills, URI symptoms. Denies dysuria, or any s/sx of infection.  Chest and abdominal pain resolved. No bleeding, bruising.  See HPI   PHYSICAL EXAM:  BP (!) 170/98   Pulse 68   Ht 5\' 4"  (1.626 m)   Wt 137 lb 9.6 oz (62.4 kg)   SpO2 96%   BMI 23.62 kg/m  Wt Readings from Last 3 Encounters:  08/16/18 137 lb 9.6 oz (62.4 kg)   08/11/18 138 lb 6.4 oz (62.8 kg)  07/01/18 141 lb (64 kg)   Elderly male, in no distress. At points during visit he seemed on verge of tears, but also smiling/laughing, full range of affect.   HEENT: conjunctiva and sclera are clear, EOMI.  OP is clear, nose without drainage, sinuses nontender Neck: no lymphadenopathy, thyromegaly or mass Heart: regular rate and rhythm Lungs: clear Abdomen: nontender Back: nontender Psych: depressed mood, but full range of affect.  Normal eye contact, speech, hygiene and grooming Neuro: alert and oriented, cranial nerves intact. Gait is slow, slightly wide-based. No tremor  PHQ-9 score of 12  Labs done at last visit reviewed--mainly notable for elevated WBC/leukocytosis. Lab Results  Component Value Date   WBC 15.9 (H) 08/11/2018   HGB 15.7 08/11/2018   HCT 46.9 08/11/2018   MCV 84 08/11/2018   PLT 339 08/11/2018     ASSESSMENT/PLAN:  Leukocytosis, unspecified type - no evidence of infection--repeat CBC today - Plan: CBC with Differential/Platelet  Depression, major, single episode, moderate (HCC) - encouraged counseling; start SSRI (risks/SE reviewed); to bring wife in to get her properly treated, address concerns. He contracts for safety - Plan: citalopram (CELEXA) 20 MG tablet  40-45 min visit, more than 1/2 spent counseling.   Counseling is strongly encouraged--Please call [facility] and schedule an appointment. We are also going to be starting you on medication and seeing you back for close follow-up.   Please know to reach out for help if your suicidal thoughts become more frequent, or if you feel you may act on them.  Start citalopram at 1/2 tablet once daily in the morning.  After a week, if you aren't having any ongoing side effects, increase to the full tablet.  If it makes you sleepy, change to taking it at bedtime. Contact us if you are having significant side effects.  I do think it is important that you return  with Danny Frederick (for HER appointment) so that I can further assess and treat her memory/cognitive decline and anxiety, which is contributing to your symptoms.  Please ask your wife or family whether or not you stop breathing (take long pauses) while you are sleeping. If so, you may need to have a sleep study performed, looking for sleep apnea.  You woke up gasping for breath, which can be indication, but most people it happens frequently through the night without it actually waking them up, and so they aren't aware it is happening.

## 2018-08-16 NOTE — Patient Instructions (Signed)
  Counseling is strongly encouraged--Please call [facility] and schedule an appointment. We are also going to be starting you on medication and seeing you back for close follow-up.   Please know to reach out for help if your suicidal thoughts become more frequent, or if you feel you may act on them.  Start citalopram at 1/2 tablet once daily in the morning.  After a week, if you aren't having any ongoing side effects, increase to the full tablet.  If it makes you sleepy, change to taking it at bedtime. Contact us if you are having significant side effects.  I do think it is important that you return with [name] (for HER appointment) so that I can further assess and treat her memory/cognitive decline and anxiety, which is contributing to your symptoms.  Please ask your wife or family whether or not you stop breathing (take long pauses) while you are sleeping. If so, you may need to have a sleep study performed, looking for sleep apnea.  You woke up gasping for breath, which can be indication, but most people it happens frequently through the night without it actually waking them up, and so they aren't aware it is happening.

## 2018-08-18 ENCOUNTER — Other Ambulatory Visit: Payer: Self-pay | Admitting: Family Medicine

## 2018-08-20 ENCOUNTER — Ambulatory Visit: Payer: Medicare Other | Admitting: Podiatry

## 2018-08-20 ENCOUNTER — Encounter: Payer: Self-pay | Admitting: Podiatry

## 2018-08-20 ENCOUNTER — Ambulatory Visit (INDEPENDENT_AMBULATORY_CARE_PROVIDER_SITE_OTHER): Payer: Medicare Other | Admitting: Podiatry

## 2018-08-20 DIAGNOSIS — Q828 Other specified congenital malformations of skin: Secondary | ICD-10-CM

## 2018-08-20 DIAGNOSIS — M79674 Pain in right toe(s): Secondary | ICD-10-CM | POA: Diagnosis not present

## 2018-08-20 DIAGNOSIS — B351 Tinea unguium: Secondary | ICD-10-CM

## 2018-08-20 DIAGNOSIS — M79675 Pain in left toe(s): Secondary | ICD-10-CM | POA: Diagnosis not present

## 2018-09-12 NOTE — Progress Notes (Signed)
Chief Complaint  Patient presents with  . Depression    follow up on depression. He is unsure if he is doing better. Currently taking 10mg  of citalopram, hasn't 20mg  yet. Forgot to go up to the 20mg .    Patient presents for 1 month follow-up on depression.  At his visit last month, he had PHQ-9 score of 12. He was started on citalopram 20mg  (starting at 1/2 tablet), and is scheduled to see a therapist on 1/20.   He reports he has been taking citalopram 10mg --he admits he stayed at the half tablet in error (not due to side effects or intentionally). There was actually a lot of confusion in trying to determine how much he has been taking, as he got a bottle of #30, and only has a few pills left. But states he has been taking 1/2 pill--he got somewhat confused during visit (he also thought he was given two different bottles to pick up, not just one).  [name] stated she has noticed some improvement.  He brought his wife in last week, accompanied by their daughter, where we discussed her memory loss in detail, and started her on Aricept. He feels like he is getting adjusted to the changes at home, feels less depressed about it.  At his last visit he reported shortness of breath, further described as that he doesn't breathe deeply.  Doesn't have problems with stairs or activity. Denies feeling like he can't catch his breath, thought he reports that just once, this past Saturday night, he woke up gasping. Denies heartburn, bad dream. He woke up because he couldn't catch his breath.  Doesn't recall this happening other times. He denies this happening again, just that one time. His wife has never  mentioned anything to him about apnea or snoring, and he doesn't believe he snores. He has ongoing fatigue during the day, but the feeling of not waking refreshed in the morning has resolved (had only recently started per last visit). He feels "worn out" and thinks it could be from age, needs more rest.   BP's  lower at home (140) per pt.  PMH, PSH, SH reviewed  Outpatient Encounter Medications as of 09/13/2018  Medication Sig Note  . aspirin 81 MG tablet Take 81 mg by mouth at bedtime.  09/07/2017: Daily   . atorvastatin (LIPITOR) 10 MG tablet TAKE 1 TABLET BY MOUTH  DAILY   . Cholecalciferol (VITAMIN D3) 2000 units TABS Take 1 tablet by mouth daily.   . citalopram (CELEXA) 20 MG tablet Take 1/2 tablet by mouth once daily. Increase to full tablet after a week, if tolerated 09/13/2018: Currently taking 10mg   . Ferrous Sulfate (IRON) 325 (65 FE) MG TABS Take 1 tablet by mouth every other day. Reported on 11/08/2015   . irbesartan (AVAPRO) 300 MG tablet Take 1 tablet (300 mg total) by mouth daily.   . metoprolol tartrate (LOPRESSOR) 25 MG tablet Take 1 tablet (25 mg total) by mouth 2 (two) times daily.   Marland Kitchen omeprazole (PRILOSEC) 20 MG capsule TAKE 1 CAPSULE BY MOUTH  EVERY OTHER DAY   . SYNTHROID 50 MCG tablet TAKE 1 TABLET BY MOUTH  DAILY BEFORE BREAKFAST   . triamcinolone cream (KENALOG) 0.1 % Apply 1 application topically 2 (two) times daily.    No facility-administered encounter medications on file as of 09/13/2018.    No Known Allergies  ROS:  Improved appetite.  +daytime fatigue, but no longer feels unrefreshed in the morning. No chest pain, palpitations, dizziness, headaches.  See HPI for details. No nausea, vomiting, abdominal pain, bowel changes.   PHYSICAL EXAM:  BP (!) 150/80   Pulse 60   Ht 5\' 4"  (1.626 m)   Wt 140 lb (63.5 kg)   BMI 24.03 kg/m   Wt Readings from Last 3 Encounters:  09/13/18 140 lb (63.5 kg)  08/16/18 137 lb 9.6 oz (62.4 kg)  08/11/18 138 lb 6.4 oz (62.8 kg)   Well appearing, pleasant, elderly male in no distress Got somewhat confused in recalling his citalopram and how he is taking it as described above.  Otherwise he is alert, oriented, and a decent historian. Normal hygiene, grooming, eye contact, speech  PHQ-9 score of 3 (fatigue daily), improved from score  of 12.  ASSESSMENT/PLAN:  Depression, major, single episode, moderate (HCC) - significantly improved on whatever dose of citalopram he has been taking (10 vs 20). To check bottle, and continue current dose. f/u 3 mos. Counseling as planne   Since fatigue is only remaining symptoms, and may not be related to depression, we elected to hold him at the 10mg  dose of citalopram--which we later figured may actually be 20mg , based on number of pills left in the bottle at home.  F/u with counseling as scheduled  Please check your bottle of citalopram at home. You should have one bottle of 20mg  tablets.  If you have only been taking 1/2 pill daily, you should still have about 14-15 pills left, as they should have given you #30. You have one refill left at the pharmacy (which, if only taking 1/2 tablet, should last for another 2 months). Please look at your bottle, and clarify if there has been a mistake in what you told us. You said you were taking 1/2 pill every day (of 20mg  citalopram), in which you should still have a couple of weeks worth of pills in the bottle.  Your depression has improved significantly--I want you to stay on whatever you are taking (whether it be 10 or 20mg ), just need to know which.

## 2018-09-13 ENCOUNTER — Encounter: Payer: Self-pay | Admitting: Family Medicine

## 2018-09-13 ENCOUNTER — Ambulatory Visit (INDEPENDENT_AMBULATORY_CARE_PROVIDER_SITE_OTHER): Payer: Medicare Other | Admitting: Family Medicine

## 2018-09-13 VITALS — BP 150/80 | HR 60 | Ht 64.0 in | Wt 140.0 lb

## 2018-09-13 DIAGNOSIS — F321 Major depressive disorder, single episode, moderate: Secondary | ICD-10-CM | POA: Diagnosis not present

## 2018-09-13 NOTE — Patient Instructions (Signed)
  Please check your bottle of citalopram at home. You should have one bottle of 20mg  tablets.  If you have only been taking 1/2 pill daily, you should still have about 14-15 pills left, as they should have given you #30. You have one refill left at the pharmacy (which, if only taking 1/2 tablet, should last for another 2 months). Please look at your bottle, and clarify if there has been a mistake in what you told us. You said you were taking 1/2 pill every day (of 20mg  citalopram), in which you should still have a couple of weeks worth of pills in the bottle.  Your depression has improved significantly--I want you to stay on whatever you are taking (whether it be 10 or 20mg ), just need to know which.

## 2018-09-20 ENCOUNTER — Ambulatory Visit (INDEPENDENT_AMBULATORY_CARE_PROVIDER_SITE_OTHER): Payer: Medicare Other | Admitting: Psychology

## 2018-09-20 DIAGNOSIS — F321 Major depressive disorder, single episode, moderate: Secondary | ICD-10-CM | POA: Diagnosis not present

## 2018-09-23 NOTE — Progress Notes (Signed)
HPI: FU TAA repair; also with micturition syncope, HTN. Admitted 12/2013 with a contained, ruptured ascending thoracic aortic aneurysm in the setting of streptococcal pneumoniae bacteremia/aortitis. He underwent repair of the ascending thoracic aortic aneurysm with a 32 mm Hemashield straight graft. He was also followed by infectious disease and treated with antibiotics. The patient was noted to have clot in the main pulmonary artery. This was likely related to compression of the pulmonary artery by the false aneurysm. He wastreated withCoumadin. Echocardiogram 7/15 showed normal LV function, mild MR and mild RVE.Monitor August 2018 showed sinus bradycardia to sinus rhythm with PACs and PVCs. CTA 7/19 showed stable repair of TAA.   Patient having near syncopal episodes at previous office visit.  Monitor November 2019 showed sinus bradycardia, normal sinus rhythm, sinus tachycardia and PACs.  Since last seen,he denies chest pain, palpitations or recurrent syncope.  Occasional dyspnea on exertion.  Current Outpatient Medications  Medication Sig Dispense Refill  . aspirin 81 MG tablet Take 81 mg by mouth at bedtime.     Marland Kitchen atorvastatin (LIPITOR) 10 MG tablet TAKE 1 TABLET BY MOUTH  DAILY 90 tablet 1  . Cholecalciferol (VITAMIN D3) 2000 units TABS Take 1 tablet by mouth daily.    . citalopram (CELEXA) 20 MG tablet Take 1/2 tablet by mouth once daily. Increase to full tablet after a week, if tolerated 30 tablet 1  . Ferrous Sulfate (IRON) 325 (65 FE) MG TABS Take 1 tablet by mouth every other day. Reported on 11/08/2015    . irbesartan (AVAPRO) 300 MG tablet Take 1 tablet (300 mg total) by mouth daily. 90 tablet 3  . metoprolol tartrate (LOPRESSOR) 25 MG tablet Take 1 tablet (25 mg total) by mouth 2 (two) times daily. 180 tablet 3  . omeprazole (PRILOSEC) 20 MG capsule TAKE 1 CAPSULE BY MOUTH  EVERY OTHER DAY 45 capsule 0  . SYNTHROID 50 MCG tablet TAKE 1 TABLET BY MOUTH  DAILY BEFORE BREAKFAST 90  tablet 2  . triamcinolone cream (KENALOG) 0.1 % Apply 1 application topically 2 (two) times daily. 454 g 0   No current facility-administered medications for this visit.      Past Medical History:  Diagnosis Date  . Allergic conjunctivitis   . Allergic rhinitis, cause unspecified    on allergy shots (Dr. Orvil Feil)  . Atherosclerosis of aorta (HCC)    noted on CT angio of abd/pelvis, in many vessels  . BPH (benign prostatic hypertrophy)   . Diverticulosis of colon 12/09  . Foot drop, right   . GERD (gastroesophageal reflux disease)   . Hearing loss in left ear    both ears now  . History of Clostridium difficile    multiple times 2016--s/p fecal transplant (Dr. Baxter Flattery).  Marland Kitchen Hx of echocardiogram    Echo (03/2014):  Mild LVH, EF 50-55%, no RWMA, Gr 1 DD, mild MR, mild reduced RVSF  . Hypertension   . Internal hemorrhoids   . Iron deficiency anemia, unspecified 6/09  . Mitral valve prolapse    h/o; normal echo 12/2011 with no MVP seen  . 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 Right ventricular dysfunction 01/23/2014   Secondary to obstruction of main pulmonary artery  . Ruptured thoracic aortic aneurysm ([place]) 01/23/2014  . S/P ascending aortic aneurysm repair 01/24/2014  . Unspecified hypothyroidism     Past Surgical History:  Procedure Laterality Date  . CERVICAL LAMINECTOMY  1972   C5-6  . COLONOSCOPY WITH PROPOFOL N/A 11/09/2014  Procedure: COLONOSCOPY WITH PROPOFOL;  Surgeon: Jerene Bears, MD;  Location: [facility];  Service: Gastroenterology;  Laterality: N/A;  . ESOPHAGOGASTRODUODENOSCOPY  10/31/08   normal; Dr. Edison Nasuti  . FECAL TRANSPLANT N/A 11/09/2014   Procedure: FECAL TRANSPLANT;  Surgeon: Jerene Bears, MD;  Location: [facility];  Service: Gastroenterology;  Laterality: N/A;  . HEMORRHOIDECTOMY WITH HEMORRHOID BANDING  04/07/13  . hemorroidal banding  04/07/2013   x3-Dr.Eric Redmond Pulling  . PROSTATE SURGERY  2007   photovaporization  . ROTATOR CUFF REPAIR  10/2008    left; Dr. Onnie Graham  . SPINE SURGERY  2006   L4-5 disk surgery  . SPINE SURGERY  04/2013   L4-5, L5-S1 fusion.  Dr. Christella Noa  . THORACIC AORTIC ANEURYSM REPAIR N/A 01/23/2014   Procedure: THORACIC ASCENDING ANEURYSM REPAIR (AAA);  Surgeon: Rexene Alberts, MD;  Location: [facility];  Service: Open Heart Surgery;  Laterality: N/A;  . TONSILLECTOMY      Social History   Socioeconomic History  . Marital status: Married    Spouse name: Not on file  . Number of children: 2  . Years of education: Not on file  . Highest education level: Not on file  Occupational History  . Not on file  Social Needs  . Financial resource strain: Not on file  . Food insecurity:    Worry: Not on file    Inability: Not on file  . Transportation needs:    Medical: Not on file    Non-medical: Not on file  Tobacco Use  . Smoking status: Former Smoker    Types: Cigarettes    Last attempt to quit: 09/01/1977    Years since quitting: 41.1  . Smokeless tobacco: Never Used  Substance and Sexual Activity  . Alcohol use: Yes    Comment: 2 glasses wine per day  . Drug use: No  . Sexual activity: Not on file  Lifestyle  . Physical activity:    Days per week: Not on file    Minutes per session: Not on file  . Stress: Not on file  Relationships  . Social connections:    Talks on phone: Not on file    Gets together: Not on file    Attends religious service: Not on file    Active member of club or organization: Not on file    Attends meetings of clubs or organizations: Not on file    Relationship status: Not on file  . Intimate partner violence:    Fear of current or ex partner: Not on file    Emotionally abused: Not on file    Physically abused: Not on file    Forced sexual activity: Not on file  Other Topics Concern  . Not on file  Social History Narrative   Lives with wife. 1 daughter in [place], 1 daughter in [place], [place].   5 grandchildren.    Family History  Problem Relation Age of Onset  .  Diabetes Mother   . Heart disease Mother        CHF  . Hypertension Mother   . Depression Mother   . Stroke Father   . Parkinsonism Father   . Hypertension Father   . Cancer Brother        bladder cancer  . Cancer Brother        prostate cancer  . Emphysema Brother   . Diabetes Sister   . Colon cancer Neg Hx     ROS: no fevers or chills, productive  cough, hemoptysis, dysphasia, odynophagia, melena, hematochezia, dysuria, hematuria, rash, seizure activity, orthopnea, PND, pedal edema, claudication. Remaining systems are negative.  Physical Exam: Well-developed well-nourished in no acute distress.  Skin is warm and dry.  HEENT is normal.  Neck is supple.  Chest is clear to auscultation with normal expansion.  Cardiovascular exam is regular rate and rhythm.  Abdominal exam nontender or distended. No masses palpated. Extremities show no edema. neuro grossly intact  A/P  1 thoracic aortic aneurysm repair-last CTA showed stable repair.  2 hypertension-blood pressure is controlled.  Continue present medications and follow.  3 history of near syncope-symptoms have resolved.  Monitor showed no significant arrhythmia.  4 hyperlipidemia-continue statin.  Kirk Ruths, MD

## 2018-09-25 ENCOUNTER — Encounter: Payer: Self-pay | Admitting: Podiatry

## 2018-09-25 NOTE — Progress Notes (Signed)
Subjective:  Patient presents to clinic with cc of  painful, thick, discolored, elongated toenails 1-5 b/l that become tender and cannot cut because of thickness.  Danny Ohara, MD    Current Outpatient Medications:  .  aspirin 81 MG tablet, Take 81 mg by mouth at bedtime. , Disp: , Rfl:  .  atorvastatin (LIPITOR) 10 MG tablet, TAKE 1 TABLET BY MOUTH  DAILY, Disp: 90 tablet, Rfl: 1 .  Cholecalciferol (VITAMIN D3) 2000 units TABS, Take 1 tablet by mouth daily., Disp: , Rfl:  .  citalopram (CELEXA) 20 MG tablet, Take 1/2 tablet by mouth once daily. Increase to full tablet after a week, if tolerated, Disp: 30 tablet, Rfl: 1 .  Ferrous Sulfate (IRON) 325 (65 FE) MG TABS, Take 1 tablet by mouth every other day. Reported on 11/08/2015, Disp: , Rfl:  .  irbesartan (AVAPRO) 300 MG tablet, Take 1 tablet (300 mg total) by mouth daily., Disp: 90 tablet, Rfl: 3 .  metoprolol tartrate (LOPRESSOR) 25 MG tablet, Take 1 tablet (25 mg total) by mouth 2 (two) times daily., Disp: 180 tablet, Rfl: 3 .  omeprazole (PRILOSEC) 20 MG capsule, TAKE 1 CAPSULE BY MOUTH  EVERY OTHER DAY, Disp: 45 capsule, Rfl: 0 .  SYNTHROID 50 MCG tablet, TAKE 1 TABLET BY MOUTH  DAILY BEFORE BREAKFAST, Disp: 90 tablet, Rfl: 2 .  triamcinolone cream (KENALOG) 0.1 %, Apply 1 application topically 2 (two) times daily., Disp: 454 g, Rfl: 0   No Known Allergies   Objective:  Physical Examination: Neurovascular status intact    Dermatological: Pedal skin with normal temperature, turgor, texture, and tone. Thick,discolored brittle toenails 1-5 b/l Porokeratotic lesion submetatarsal head 1 left, submetatarsal head 4 left  Assessment: Mycotic nail infection with pain 1-5 b/l Porokeratotic lesions submetatarsal head 1 left, submetatarsal head 4 left  Plan: Debride painful toenails 1-5 b/l with no iatrogenic bleeding. Porokeratotic lesions pared and enucleated without complication. Continue soft supportive shoe gear daily Report any  pedal injuries to medical professional immediately Follow up when pain symptoms return.

## 2018-10-04 ENCOUNTER — Ambulatory Visit (INDEPENDENT_AMBULATORY_CARE_PROVIDER_SITE_OTHER): Payer: Medicare Other | Admitting: Cardiology

## 2018-10-04 ENCOUNTER — Encounter: Payer: Self-pay | Admitting: Cardiology

## 2018-10-04 VITALS — BP 136/74 | HR 60 | Ht 64.0 in | Wt 139.0 lb

## 2018-10-04 DIAGNOSIS — R55 Syncope and collapse: Secondary | ICD-10-CM

## 2018-10-04 DIAGNOSIS — E7800 Pure hypercholesterolemia, unspecified: Secondary | ICD-10-CM | POA: Diagnosis not present

## 2018-10-04 DIAGNOSIS — I10 Essential (primary) hypertension: Secondary | ICD-10-CM

## 2018-10-04 NOTE — Patient Instructions (Signed)
Medication Instructions:  NO CHANGE If you need a refill on your cardiac medications before your next appointment, please call your pharmacy.   Lab work: If you have labs (blood work) drawn today and your tests are completely normal, you will receive your results only by: Marland Kitchen MyChart Message (if you have MyChart) OR . A paper copy in the mail If you have any lab test that is abnormal or we need to change your treatment, we will call you to review the results.  Follow-Up: At [facility], you and your health needs are our priority.  As part of our continuing mission to provide you with exceptional heart care, we have created designated Provider Care Teams.  These Care Teams include your primary Cardiologist (physician) and Advanced Practice Providers (APPs -  Physician Assistants and Nurse Practitioners) who all work together to provide you with the care you need, when you need it. You will need a follow up appointment in 12 months.  Please call our office 2 months in advance to schedule this appointment.  You may see Kirk Ruths MD or one of the following Advanced Practice Providers on your designated Care Team:   Kerin Ransom, PA-C Roby Lofts, [place] . Sande Rives, PA-C  CALL IN December TO SCHEDULE APPOINTMENT IN [place]

## 2018-10-06 ENCOUNTER — Ambulatory Visit (INDEPENDENT_AMBULATORY_CARE_PROVIDER_SITE_OTHER): Payer: Medicare Other | Admitting: Psychology

## 2018-10-06 DIAGNOSIS — F321 Major depressive disorder, single episode, moderate: Secondary | ICD-10-CM

## 2018-10-08 ENCOUNTER — Other Ambulatory Visit: Payer: Self-pay | Admitting: Family Medicine

## 2018-10-08 DIAGNOSIS — F321 Major depressive disorder, single episode, moderate: Secondary | ICD-10-CM

## 2018-10-08 NOTE — Telephone Encounter (Signed)
[facility] is requesting to fill pt citalopram. Please advise KH 

## 2018-10-20 ENCOUNTER — Ambulatory Visit (INDEPENDENT_AMBULATORY_CARE_PROVIDER_SITE_OTHER): Payer: Medicare Other | Admitting: Psychology

## 2018-10-20 DIAGNOSIS — F321 Major depressive disorder, single episode, moderate: Secondary | ICD-10-CM

## 2018-10-27 ENCOUNTER — Ambulatory Visit (INDEPENDENT_AMBULATORY_CARE_PROVIDER_SITE_OTHER): Payer: Medicare Other | Admitting: Family Medicine

## 2018-10-27 ENCOUNTER — Encounter: Payer: Self-pay | Admitting: Family Medicine

## 2018-10-27 VITALS — BP 148/70 | HR 68 | Temp 98.4°F | Ht 64.0 in | Wt 140.0 lb

## 2018-10-27 DIAGNOSIS — R238 Other skin changes: Secondary | ICD-10-CM | POA: Diagnosis not present

## 2018-10-27 DIAGNOSIS — F321 Major depressive disorder, single episode, moderate: Secondary | ICD-10-CM

## 2018-10-27 NOTE — Progress Notes (Signed)
Chief Complaint  Patient presents with  . Scrape    under right arm that has been there for weeks. Changed from stick deodorant to spray. Tried neopsorin, neomycin, as well as hydrocortisone-none of which have helped.    First noted a rash at least a few weeks ago in the right axilla.  He thought maybe he scraped it with the edge of the deoderant container, but now isn't sure.  It has "almost gone away" a few times, but never completely resolves.  It is slightly sore, not itchy.  Still "argues over nothing" with his wife, but overall doing much better.  No longer getting counseling. Wife is taking her aricept.  He continues on 20mg  of citalopram, without side effects.  Has a hard time getting out of bed in the morning, but is fine the rest of the day. Once he is up, he is fine.  Denies a lot of fatigue or any depression during the day.  PMH, PSH, SH reviewed  Outpatient Encounter Medications as of 10/27/2018  Medication Sig Note  . aspirin 81 MG tablet Take 81 mg by mouth at bedtime.  09/07/2017: Daily   . atorvastatin (LIPITOR) 10 MG tablet TAKE 1 TABLET BY MOUTH  DAILY   . Cholecalciferol (VITAMIN D3) 2000 units TABS Take 1 tablet by mouth daily.   . citalopram (CELEXA) 20 MG tablet Take 1 tablet (20 mg total) by mouth daily.   . Ferrous Sulfate (IRON) 325 (65 FE) MG TABS Take 1 tablet by mouth every other day. Reported on 11/08/2015   . ipratropium (ATROVENT) 0.03 % nasal spray USE 2 SPRAYS IN EACH NOSTRIL TWICE A DAY AS NEEDED   . irbesartan (AVAPRO) 300 MG tablet Take 1 tablet (300 mg total) by mouth daily.   . metoprolol tartrate (LOPRESSOR) 25 MG tablet Take 1 tablet (25 mg total) by mouth 2 (two) times daily.   Marland Kitchen omeprazole (PRILOSEC) 20 MG capsule TAKE 1 CAPSULE BY MOUTH  EVERY OTHER DAY   . SYNTHROID 50 MCG tablet TAKE 1 TABLET BY MOUTH  DAILY BEFORE BREAKFAST   . triamcinolone cream (KENALOG) 0.1 % Apply 1 application topically 2 (two) times daily.    No facility-administered encounter  medications on file as of 10/27/2018.    No Known Allergies  ROS: no fever, chills, URI symptoms, chest pain, shortness of breath.  BP is elevated today--recently saw Dr. Stanford Breed and it was lower. No headaches, dizziness.  Moods are stable. See HPI   PHYSICAL EXAM:  BP (!) 148/70   Pulse 68   Temp 98.4 F (36.9 C) (Tympanic)   Ht 5\' 4"  (1.626 m)   Wt 140 lb (63.5 kg)   BMI 24.03 kg/m   BP Readings from Last 3 Encounters:  10/27/18 (!) 148/70  10/04/18 136/74  09/13/18 (!) 150/80   Well developed, pleasant, elderly male in good spirits Neck: no lymphadenopathy or mass Heart: regular rate and rhythm Lungs: clear bilaterally Neuro: alert and oriented, cranial nerves and gait are normal.  Right axilla-- There are a few areas of erythema scattered within the axilla.  The central area is somewhat excoriated and more inflamed. The other areas are minimally raised, but pink. There is no soft tissue swelling, warmth or drainage.  No axillary lymphadenopathy or right breast mass  ASSESSMENT/PLAN:  Skin irritation - no evidence of infection.  There is a small abrasion, and areas of redness.  Bacitracin, hydrocortisone. f/u if persists/worsen  Depression, major, single episode, moderate ([place]) - improved.  Continue citalopram   There are a few areas of redness and one area of irritation in the right armpit. Avoid all deoderants for the next few days (okay to use a powder, if needed). Use bacitracin antibacterial ointment (instead of the neosporin) up to 3 times daily for the next few days until the irritated area has improved/healed. You can use the hydrocortisone cream to all of the red areas twice daily for the next few days as well. Hopefully this will be completely resolved within the week.  Once the redness has improved (especially the irritated area) you may resume use of deodorant. I do not suspect an allergic reaction, as the left side is completely normal, and has been exposed  to the same deodorants.

## 2018-10-27 NOTE — Patient Instructions (Addendum)
  There are a few areas of redness and one area of irritation in the right armpit. Avoid all deoderants for the next few days (okay to use a powder, if needed). Use bacitracin antibacterial ointment (instead of the neosporin) up to 3 times daily for the next few days until the irritated area has improved/healed. You can use the hydrocortisone cream to all of the red areas twice daily for the next few days as well. Hopefully this will be completely resolved within the week.  Once the redness has improved (especially the irritated area) you may resume use of deodorant. I do not suspect an allergic reaction, as the left side is completely normal, and has been exposed to the same deodorants.

## 2018-10-29 ENCOUNTER — Encounter: Payer: Self-pay | Admitting: Family Medicine

## 2018-11-03 ENCOUNTER — Ambulatory Visit: Payer: Medicare Other | Admitting: Psychology

## 2018-11-08 ENCOUNTER — Other Ambulatory Visit: Payer: Self-pay | Admitting: Family Medicine

## 2018-11-16 ENCOUNTER — Other Ambulatory Visit: Payer: Self-pay | Admitting: Family Medicine

## 2018-11-16 DIAGNOSIS — E039 Hypothyroidism, unspecified: Secondary | ICD-10-CM

## 2018-11-17 ENCOUNTER — Other Ambulatory Visit: Payer: Self-pay | Admitting: Family Medicine

## 2018-11-19 ENCOUNTER — Encounter: Payer: Self-pay | Admitting: Podiatry

## 2018-11-19 ENCOUNTER — Other Ambulatory Visit: Payer: Self-pay

## 2018-11-19 ENCOUNTER — Ambulatory Visit (INDEPENDENT_AMBULATORY_CARE_PROVIDER_SITE_OTHER): Payer: Medicare Other | Admitting: Podiatry

## 2018-11-19 DIAGNOSIS — M79674 Pain in right toe(s): Secondary | ICD-10-CM

## 2018-11-19 DIAGNOSIS — M79675 Pain in left toe(s): Secondary | ICD-10-CM | POA: Diagnosis not present

## 2018-11-19 DIAGNOSIS — B070 Plantar wart: Secondary | ICD-10-CM

## 2018-11-19 DIAGNOSIS — B351 Tinea unguium: Secondary | ICD-10-CM | POA: Diagnosis not present

## 2018-11-19 MED ORDER — SALICYLIC ACID 17 % EX SOLN: CUTANEOUS | 0 refills | Status: DC

## 2018-11-19 NOTE — Patient Instructions (Signed)
Corns and Calluses Corns are small areas of thickened skin that occur on the top, sides, or tip of a toe. They contain a cone-shaped core with a point that can press on a nerve below. This causes pain.  Calluses are areas of thickened skin that can occur anywhere on the body, including the hands, fingers, palms, soles of the feet, and heels. Calluses are usually larger than corns. What are the causes? Corns and calluses are caused by rubbing (friction) or pressure, such as from shoes that are too tight or do not fit properly. What increases the risk? Corns are more likely to develop in people who have misshapen toes (toe deformities), such as hammer toes. Calluses can occur with friction to any area of the skin. They are more likely to develop in people who:  Work with their hands.  Wear shoes that fit poorly, are too tight, or are high-heeled.  Have toe deformities. What are the signs or symptoms? Symptoms of a corn or callus include:  A hard growth on the skin.  Pain or tenderness under the skin.  Redness and swelling.  Increased discomfort while wearing tight-fitting shoes, if your feet are affected. If a corn or callus becomes infected, symptoms may include:  Redness and swelling that gets worse.  Pain.  Fluid, blood, or pus draining from the corn or callus. How is this diagnosed? Corns and calluses may be diagnosed based on your symptoms, your medical history, and a physical exam. How is this treated? Treatment for corns and calluses may include:  Removing the cause of the friction or pressure. This may involve: ? Changing your shoes. ? Wearing shoe inserts (orthotics) or other protective layers in your shoes, such as a corn pad. ? Wearing gloves.  Applying medicine to the skin (topical medicine) to help soften skin in the hardened, thickened areas.  Removing layers of dead skin with a file to reduce the size of the corn or callus.  Removing the corn or callus with a  scalpel or laser.  Taking antibiotic medicines, if your corn or callus is infected.  Having surgery, if a toe deformity is the cause. Follow these instructions at home:   Take over-the-counter and prescription medicines only as told by your health care provider.  If you were prescribed an antibiotic, take it as told by your health care provider. Do not stop taking it even if your condition starts to improve.  Wear shoes that fit well. Avoid wearing high-heeled shoes and shoes that are too tight or too loose.  Wear any padding, protective layers, gloves, or orthotics as told by your health care provider.  Soak your hands or feet and then use a file or pumice stone to soften your corn or callus. Do this as told by your health care provider.  Check your corn or callus every day for symptoms of infection. Contact a health care provider if you:  Notice that your symptoms do not improve with treatment.  Have redness or swelling that gets worse.  Notice that your corn or callus becomes painful.  Have fluid, blood, or pus coming from your corn or callus.  Have new symptoms. Summary  Corns are small areas of thickened skin that occur on the top, sides, or tip of a toe.  Calluses are areas of thickened skin that can occur anywhere on the body, including the hands, fingers, palms, and soles of the feet. Calluses are usually larger than corns.  Corns and calluses are caused by   rubbing (friction) or pressure, such as from shoes that are too tight or do not fit properly.  Treatment may include wearing any padding, protective layers, gloves, or orthotics as told by your health care provider. This information is not intended to replace advice given to you by your health care provider. Make sure you discuss any questions you have with your health care provider. Document Released: 05/24/2004 Document Revised: 07/01/2017 Document Reviewed: 07/01/2017 [employer] Interactive Patient Education   2019 [employer]  Onychomycosis/Fungal Toenails  WHAT IS IT? An infection that lies within the keratin of your nail plate that is caused by a fungus.  WHY ME? Fungal infections affect all ages, sexes, races, and creeds.  There may be many factors that predispose you to a fungal infection such as age, coexisting medical conditions such as diabetes, or an autoimmune disease; stress, medications, fatigue, genetics, etc.  Bottom line: fungus thrives in a warm, moist environment and your shoes offer such a location.  IS IT CONTAGIOUS? Theoretically, yes.  You do not want to share shoes, nail clippers or files with someone who has fungal toenails.  Walking around barefoot in the same room or sleeping in the same bed is unlikely to transfer the organism.  It is important to realize, however, that fungus can spread easily from one nail to the next on the same foot.  HOW DO WE TREAT THIS?  There are several ways to treat this condition.  Treatment may depend on many factors such as age, medications, pregnancy, liver and kidney conditions, etc.  It is best to ask your doctor which options are available to you.  1. No treatment.   Unlike many other medical concerns, you can live with this condition.  However for many people this can be a painful condition and may lead to ingrown toenails or a bacterial infection.  It is recommended that you keep the nails cut short to help reduce the amount of fungal nail. 2. Topical treatment.  These range from herbal remedies to prescription strength nail lacquers.  About 40-50% effective, topicals require twice daily application for approximately 9 to 12 months or until an entirely new nail has grown out.  The most effective topicals are medical grade medications available through physicians offices. 3. Oral antifungal medications.  With an 80-90% cure rate, the most common oral medication requires 3 to 4 months of therapy and stays in your system for a year as the new nail  grows out.  Oral antifungal medications do require blood work to make sure it is a safe drug for you.  A liver function panel will be performed prior to starting the medication and after the first month of treatment.  It is important to have the blood work performed to avoid any harmful side effects.  In general, this medication safe but blood work is required. 4. Laser Therapy.  This treatment is performed by applying a specialized laser to the affected nail plate.  This therapy is noninvasive, fast, and non-painful.  It is not covered by insurance and is therefore, out of pocket.  The results have been very good with a 80-95% cure rate.  [facility] is the only practice in the area to offer this therapy. 5. Permanent Nail Avulsion.  Removing the entire nail so that a new nail will not grow back. 

## 2018-11-20 ENCOUNTER — Encounter: Payer: Self-pay | Admitting: Podiatry

## 2018-11-24 ENCOUNTER — Other Ambulatory Visit: Payer: Self-pay

## 2018-11-24 ENCOUNTER — Emergency Department (HOSPITAL_COMMUNITY)
Admission: EM | Admit: 2018-11-24 | Discharge: 2018-11-24 | Disposition: A | Discharge status: Home / Self Care | Payer: Medicare Other | Attending: Emergency Medicine | Admitting: Emergency Medicine

## 2018-11-24 ENCOUNTER — Encounter: Payer: Self-pay | Admitting: Family Medicine

## 2018-11-24 ENCOUNTER — Emergency Department (HOSPITAL_COMMUNITY): Payer: Medicare Other

## 2018-11-24 ENCOUNTER — Encounter (HOSPITAL_COMMUNITY): Payer: Self-pay

## 2018-11-24 DIAGNOSIS — Z87891 Personal history of nicotine dependence: Secondary | ICD-10-CM | POA: Diagnosis not present

## 2018-11-24 DIAGNOSIS — S61213A Laceration without foreign body of left middle finger without damage to nail, initial encounter: Secondary | ICD-10-CM | POA: Diagnosis not present

## 2018-11-24 DIAGNOSIS — S01511A Laceration without foreign body of lip, initial encounter: Secondary | ICD-10-CM

## 2018-11-24 DIAGNOSIS — Y939 Activity, unspecified: Secondary | ICD-10-CM | POA: Insufficient documentation

## 2018-11-24 DIAGNOSIS — T07XXXA Unspecified multiple injuries, initial encounter: Secondary | ICD-10-CM

## 2018-11-24 DIAGNOSIS — Z7982 Long term (current) use of aspirin: Secondary | ICD-10-CM | POA: Insufficient documentation

## 2018-11-24 DIAGNOSIS — Y92480 Sidewalk as the place of occurrence of the external cause: Secondary | ICD-10-CM | POA: Diagnosis not present

## 2018-11-24 DIAGNOSIS — S0121XA Laceration without foreign body of nose, initial encounter: Secondary | ICD-10-CM | POA: Diagnosis not present

## 2018-11-24 DIAGNOSIS — Y999 Unspecified external cause status: Secondary | ICD-10-CM | POA: Diagnosis not present

## 2018-11-24 DIAGNOSIS — W101XXA Fall (on)(from) sidewalk curb, initial encounter: Secondary | ICD-10-CM | POA: Insufficient documentation

## 2018-11-24 DIAGNOSIS — W19XXXA Unspecified fall, initial encounter: Secondary | ICD-10-CM

## 2018-11-24 DIAGNOSIS — Z79899 Other long term (current) drug therapy: Secondary | ICD-10-CM | POA: Diagnosis not present

## 2018-11-24 DIAGNOSIS — I10 Essential (primary) hypertension: Secondary | ICD-10-CM | POA: Diagnosis not present

## 2018-11-24 DIAGNOSIS — S61212A Laceration without foreign body of right middle finger without damage to nail, initial encounter: Secondary | ICD-10-CM | POA: Diagnosis not present

## 2018-11-24 DIAGNOSIS — E039 Hypothyroidism, unspecified: Secondary | ICD-10-CM | POA: Insufficient documentation

## 2018-11-24 DIAGNOSIS — S01411A Laceration without foreign body of right cheek and temporomandibular area, initial encounter: Secondary | ICD-10-CM | POA: Diagnosis not present

## 2018-11-24 DIAGNOSIS — S0990XA Unspecified injury of head, initial encounter: Secondary | ICD-10-CM | POA: Diagnosis not present

## 2018-11-24 DIAGNOSIS — M79642 Pain in left hand: Secondary | ICD-10-CM | POA: Diagnosis not present

## 2018-11-24 DIAGNOSIS — R58 Hemorrhage, not elsewhere classified: Secondary | ICD-10-CM | POA: Diagnosis not present

## 2018-11-24 MED ORDER — LIDOCAINE-EPINEPHRINE (PF) 2 %-1:200000 IJ SOLN
20.0000 mL | Freq: Once | INTRAMUSCULAR | Status: AC
  Administered 2018-11-24: 20 mL

## 2018-11-24 MED ORDER — LIDOCAINE-EPINEPHRINE (PF) 2 %-1:200000 IJ SOLN
INTRAMUSCULAR | Status: AC
  Administered 2018-11-24: 20 mL
  Filled 2018-11-24: qty 20

## 2018-11-24 NOTE — ED Triage Notes (Addendum)
Pt arrives from home via [facility], pt was walking with cane down a hill and fell foreward hitting face on sidewalk. Pt has laceration to nose, lost tooth at scene, and laceration to left middle finger. Pt denies thinners, loss of consciousness or pain. Pt alert and ambulatory. Daughter at home with pt wife, daughter aware pt is here.

## 2018-11-24 NOTE — ED Notes (Signed)
Patient verbalizes understanding of discharge instructions. Opportunity for questioning and answers were provided. Armband removed by staff, pt discharged from ED. Wound care supplies given. Hearing aids and dentures returned to patient. Daughter to pick up patient.

## 2018-11-24 NOTE — Discharge Instructions (Signed)
You were seen in the emergency department after a fall.  You had extensive abrasions and lacerations to your face and left hand.  The wound in your mouth was bleeding extensively and required sutures.  You should apply ice to your face and use bacitracin to your wounds.  Please do not put any bacitracin over the right cheek wound as we used tissue adhesive on this area.  Soap and water.  Tylenol for pain.  Please follow-up with your doctor for suture removal in 5 to 7 days.  Return if any sign of infection.

## 2018-11-24 NOTE — ED Provider Notes (Signed)
[facility] EMERGENCY DEPARTMENT Provider Note   CSN: [id number] Arrival date & time: 11/24/18  1641    History   Chief Complaint Chief Complaint  Patient presents with   Fall   Laceration    HPI Danny Frederick is a 85 y.o. male.  He had a mechanical fall walking outside and he struck his face on the sidewalk.  He has lacerations about his nose and mouth and also his left middle finger.  He denies loss of consciousness and is not on any blood thinners.  No chest pain or shortness of breath.  No numbness or tingling.  No double vision or blurry vision.     The history is provided by the patient.  Fall  This is a new problem. The current episode started less than 1 hour ago. The problem occurs constantly. The problem has not changed since onset.Pertinent negatives include no chest pain, no abdominal pain, no headaches and no shortness of breath. Nothing aggravates the symptoms. Nothing relieves the symptoms. He has tried nothing for the symptoms. The treatment provided no relief.  Laceration  Location:  Mouth Mouth laceration location:  Upper inner lip Length:  3 Quality: jagged   Bleeding: arterial   Laceration mechanism:  Fall Pain details:    Quality:  Aching   Severity:  Moderate   Timing:  Constant   Progression:  Unchanged Relieved by:  None tried Worsened by:  Nothing Ineffective treatments:  None tried Associated symptoms: no fever and no rash     Past Medical History:  Diagnosis Date   Allergic conjunctivitis    Allergic rhinitis, cause unspecified    on allergy shots (Dr. Orvil Feil)   Atherosclerosis of aorta [facility])    noted on CT angio of abd/pelvis, in many vessels   BPH (benign prostatic hypertrophy)    Diverticulosis of colon 12/09   Foot drop, right    GERD (gastroesophageal reflux disease)    Hearing loss in left ear    both ears now   History of Clostridium difficile    multiple times 2016--s/p fecal transplant (Dr.  Baxter Flattery).   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   Right ventricular dysfunction 01/23/2014   Secondary to obstruction of main pulmonary artery   Ruptured thoracic aortic aneurysm ([place]) 01/23/2014   S/P ascending aortic aneurysm repair 01/24/2014   Unspecified hypothyroidism     Patient Active Problem List   Diagnosis Date Noted   Spinal stenosis of lumbar region 09/08/2017   Atherosclerosis of aorta ([place]) 11/08/2015   Recurrent Clostridium difficile diarrhea    Bacteremia    S/P ascending aortic aneurysm repair 01/24/2014   Ruptured thoracic aortic aneurysm ([place]) 01/23/2014   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   Right ventricular dysfunction 01/23/2014   Solitary pulmonary nodule 12/08/2013   Chest pain 12/02/2013   Allergic conjunctivitis 10/20/2012   Syncope 01/22/2012   Hypothyroidism 01/22/2011   Essential hypertension, benign 01/22/2011   GERD (gastroesophageal reflux disease) 01/22/2011   Iron deficiency anemia 01/22/2011   IRON DEFICIENCY ANEMIA SECONDARY TO BLOOD LOSS 11/08/2008   ANEMIA 05/26/2008   Essential hypertension 03/02/2008   DYSPNEA 03/02/2008    Past  Surgical History:  Procedure Laterality Date   CERVICAL LAMINECTOMY  1972   C5-6   COLONOSCOPY WITH PROPOFOL N/A 11/09/2014   Procedure: COLONOSCOPY WITH PROPOFOL;  Surgeon: Jerene Bears, MD;  Location: [facility];  Service: Gastroenterology;  Laterality: N/A;   ESOPHAGOGASTRODUODENOSCOPY  10/31/08   normal; Dr. Bebe Liter TRANSPLANT N/A 11/09/2014   Procedure: FECAL TRANSPLANT;  Surgeon: Jerene Bears, MD;  Location: [facility];  Service: Gastroenterology;  Laterality: N/A;   HEMORRHOIDECTOMY  WITH HEMORRHOID BANDING  04/07/13   hemorroidal banding  04/07/2013   x3-Dr.Eric Redmond Pulling   PROSTATE SURGERY  2007   photovaporization   ROTATOR CUFF REPAIR  10/2008   left; Dr. Onnie Graham   SPINE SURGERY  2006   L4-5 disk surgery   SPINE SURGERY  04/2013   L4-5, L5-S1 fusion.  Dr. Christella Noa   THORACIC AORTIC ANEURYSM REPAIR N/A 01/23/2014   Procedure: [place] (AAA);  Surgeon: Rexene Alberts, MD;  Location: [facility];  Service: Open Heart Surgery;  Laterality: N/A;   TONSILLECTOMY          Home Medications    Prior to Admission medications   Medication Sig Start Date End Date Taking? Authorizing Provider  aspirin 81 MG tablet Take 81 mg by mouth at bedtime.     [provider]  atorvastatin (LIPITOR) 10 MG tablet TAKE 1 TABLET BY MOUTH  DAILY 11/17/18   Rita Ohara, MD  Cholecalciferol (VITAMIN D3) 2000 units TABS Take 1 tablet by mouth daily.    [provider]  citalopram (CELEXA) 20 MG tablet Take 1 tablet (20 mg total) by mouth daily. 10/08/18   Rita Ohara, MD  Ferrous Sulfate (IRON) 325 (65 FE) MG TABS Take 1 tablet by mouth every other day. Reported on 11/08/2015    [provider]  ipratropium (ATROVENT) 0.03 % nasal spray USE 2 SPRAYS IN EACH NOSTRIL TWICE A DAY AS NEEDED 10/11/18   [provider]  irbesartan (AVAPRO) 300 MG tablet Take 1 tablet (300 mg total) by mouth daily. 06/28/18   Lelon Perla, MD  metoprolol tartrate (LOPRESSOR) 25 MG tablet Take 1 tablet (25 mg total) by mouth 2 (two) times daily. 06/28/18   Lelon Perla, MD  omeprazole (PRILOSEC) 20 MG capsule TAKE 1 CAPSULE BY MOUTH  EVERY OTHER DAY 11/08/18   Rita Ohara, MD  salicylic acid-lactic acid 17 % external solution Apply to left foot wart once daily 11/19/18   Marzetta Board, DPM  SYNTHROID 50 MCG tablet TAKE 1 TABLET BY MOUTH  DAILY BEFORE BREAKFAST 11/16/18   Rita Ohara, MD  triamcinolone cream (KENALOG) 0.1 % Apply 1 application topically 2  (two) times daily. 10/16/16   Rita Ohara, MD    Family History Family History  Problem Relation Age of Onset   Diabetes Mother    Heart disease Mother        CHF   Hypertension Mother    Depression Mother    Stroke Father    Parkinsonism Father    Hypertension Father    Cancer Brother        bladder cancer   Cancer Brother        prostate cancer   Emphysema Brother    Diabetes Sister    Colon cancer Neg Hx     Social History Social History   Tobacco Use   Smoking status: Former Smoker    Types: Cigarettes    Last attempt to quit:  09/01/1977    Years since quitting: 41.2   Smokeless tobacco: Never Used  Substance Use Topics   Alcohol use: Yes    Comment: 2 glasses wine per day   Drug use: No     Allergies   Patient has no known allergies.   Review of Systems Review of Systems  Constitutional: Negative for fever.  HENT: Negative for sore throat.   Eyes: Negative for visual disturbance.  Respiratory: Negative for shortness of breath.   Cardiovascular: Negative for chest pain.  Gastrointestinal: Negative for abdominal pain.  Genitourinary: Negative for dysuria.  Musculoskeletal: Negative for neck pain.  Skin: Negative for rash.  Neurological: Negative for headaches.     Physical Exam Updated Vital Signs BP (!) 162/105    Pulse 68    Temp (!) 97 F (36.1 C) (Axillary)    Resp 20    Ht 5\' 3"  (1.6 m)    Wt 63.5 kg    SpO2 98%    BMI 24.80 kg/m   Physical Exam Vitals signs and nursing note reviewed.  Constitutional:      Appearance: He is well-developed.  HENT:     Head: Normocephalic.     Comments: He has a 2 cm laceration on his right cheek.  He has a 2 cm laceration bridge of nose.  Is got extensive road rash over the nose upper lip.  There is a complex 3 cm laceration inside the mouth.    Mouth/Throat:     Comments: There is a jagged laceration inside the upper lip with arterial bleeding.  I placed multiple figure-of-eight's in this  area to stabilize control the bleeding.  Suctioned out a lot of clot in his mouth. Eyes:     Conjunctiva/sclera: Conjunctivae normal.  Neck:     Musculoskeletal: Neck supple.  Cardiovascular:     Rate and Rhythm: Normal rate and regular rhythm.     Heart sounds: No murmur.  Pulmonary:     Effort: Pulmonary effort is normal. No respiratory distress.     Breath sounds: Normal breath sounds.  Abdominal:     Palpations: Abdomen is soft.     Tenderness: There is no abdominal tenderness.  Musculoskeletal: Normal range of motion.     Right lower leg: No edema.     Left lower leg: No edema.     Comments: 2 cm laceration right middle finger palmar surface at the PIP.  FDS FDP and sensation intact.  Skin:    General: Skin is warm and dry.  Neurological:     General: No focal deficit present.     Mental Status: He is alert and oriented to person, place, and time.     Sensory: No sensory deficit.     Motor: No weakness.      ED Treatments / Results  Labs (all labs ordered are listed, but only abnormal results are displayed) Labs Reviewed - No data to display  EKG None  Radiology Ct Head Wo Contrast  Result Date: 11/24/2018 CLINICAL DATA:  Patient fell while walking down a hill landing on the face and lacerations to nose S2 at scene laceration of middle finger. Patient is a is loss of consciousness. EXAM: CT HEAD WITHOUT CONTRAST CT MAXILLOFACIAL WITHOUT CONTRAST TECHNIQUE: Multidetector CT imaging of the head and maxillofacial structures were performed using the standard protocol without intravenous contrast. Multiplanar CT image reconstructions of the maxillofacial structures were also generated. COMPARISON:  None. FINDINGS: CT HEAD FINDINGS Brain: No acute intraparenchymal hemorrhage,  large vascular territory infarction, edema or midline shift. Chronic moderate small vessel ischemic disease of periventricular and deep white matter with small chronic bilateral basal ganglial lacunar  infarcts. Chronic stable mild ventriculomegaly likely representing central atrophy. Midline fourth ventricle and basal cisterns without effacement. Probable small right pontine lacunar infarct, chronic in appearance Vascular: No hyperdense vessel sign. Skull: Intact Other: None CT MAXILLOFACIAL FINDINGS Osseous: Uncovertebral joint osteoarthritis with uncinate spurring is identified of the included cervical spine from C2 through C5-6 with bilateral facet arthropathy. No acute cervical spine fracture. Intact skull base and temporomandibular joints. No fracture of the mandible. Home all come artifacts limit assessment of the dentition. The patient is partially edentulous involving the upper teeth. No acute fracture maxillofacial fracture. Orbits: Intact without retrobulbar hemorrhage. Sinuses: Minimal ethmoid sinus mucosal thickening. Soft tissues: Left malar superficial soft tissue debris is noted. IMPRESSION: 1. Atrophy with chronic microvascular ischemic disease brain. No acute intracranial abnormality. 2. No acute maxillofacial fracture. Soft tissue debris of the left cheek. Electronically Signed   By: Ashley Royalty M.D.   On: 11/24/2018 18:08   Dg Hand Complete Left  Result Date: 11/24/2018 CLINICAL DATA:  Status post fall, hand pain EXAM: LEFT HAND - COMPLETE 3+ VIEW COMPARISON:  None. FINDINGS: No acute fracture or dislocation. No aggressive osseous lesion. Mild osteoarthritis of the first [place] joint. Moderate osteoarthritis of the first MCP joint and first IP joint. Mild-moderate osteoarthritis of the second and third MCP joints. Mild osteoarthritis of the second PIP and DIP joints. Mild osteoarthritis of the fifth DIP joint. IMPRESSION: No acute osseous injury of the left hand. Electronically Signed   By: Kathreen Devoid   On: 11/24/2018 18:17   Ct Maxillofacial Wo Cm  Result Date: 11/24/2018 CLINICAL DATA:  Patient fell while walking down a hill landing on the face and lacerations to nose S2 at scene  laceration of middle finger. Patient is a is loss of consciousness. EXAM: CT HEAD WITHOUT CONTRAST CT MAXILLOFACIAL WITHOUT CONTRAST TECHNIQUE: Multidetector CT imaging of the head and maxillofacial structures were performed using the standard protocol without intravenous contrast. Multiplanar CT image reconstructions of the maxillofacial structures were also generated. COMPARISON:  None. FINDINGS: CT HEAD FINDINGS Brain: No acute intraparenchymal hemorrhage, large vascular territory infarction, edema or midline shift. Chronic moderate small vessel ischemic disease of periventricular and deep white matter with small chronic bilateral basal ganglial lacunar infarcts. Chronic stable mild ventriculomegaly likely representing central atrophy. Midline fourth ventricle and basal cisterns without effacement. Probable small right pontine lacunar infarct, chronic in appearance Vascular: No hyperdense vessel sign. Skull: Intact Other: None CT MAXILLOFACIAL FINDINGS Osseous: Uncovertebral joint osteoarthritis with uncinate spurring is identified of the included cervical spine from C2 through C5-6 with bilateral facet arthropathy. No acute cervical spine fracture. Intact skull base and temporomandibular joints. No fracture of the mandible. Home all come artifacts limit assessment of the dentition. The patient is partially edentulous involving the upper teeth. No acute fracture maxillofacial fracture. Orbits: Intact without retrobulbar hemorrhage. Sinuses: Minimal ethmoid sinus mucosal thickening. Soft tissues: Left malar superficial soft tissue debris is noted. IMPRESSION: 1. Atrophy with chronic microvascular ischemic disease brain. No acute intracranial abnormality. 2. No acute maxillofacial fracture. Soft tissue debris of the left cheek. Electronically Signed   By: Ashley Royalty M.D.   On: 11/24/2018 18:08    Procedures .Marland KitchenLaceration Repair Date/Time: 11/24/2018 7:04 PM Performed by: Hayden Rasmussen, MD Authorized by:  Hayden Rasmussen, MD   Consent:    Consent  obtained:  Verbal   Consent given by:  Patient   Risks discussed:  Infection, pain, poor cosmetic result, poor wound healing and retained foreign body   Alternatives discussed:  No treatment and delayed treatment Anesthesia (see MAR for exact dosages):    Anesthesia method:  Local infiltration   Local anesthetic:  Lidocaine 1% WITH epi Laceration details:    Location:  Lip   Lip location:  Upper interior lip   Length (cm):  4 Repair type:    Repair type:  Simple Exploration:    Hemostasis achieved with:  Direct pressure   Contaminated: no   Skin repair:    Repair method:  Sutures   Suture size:  4-0   Suture material:  Chromic gut   Suture technique:  Figure eight   Number of sutures:  3 Approximation:    Approximation:  Loose   Vermilion border: poorly aligned   Post-procedure details:    Dressing:  Open (no dressing)   Patient tolerance of procedure:  Tolerated well, no immediate complications Comments:     Patient needed some mattress sutures to control arterial bleeding.  Am not going to reapproximate vermilion border secondary to lip edema.  He will need to have the swelling come down and reevaluate to see if that would be required. Marland Kitchen.Laceration Repair Date/Time: 11/24/2018 7:06 PM Performed by: Hayden Rasmussen, MD Authorized by: Hayden Rasmussen, MD   Consent:    Consent obtained:  Verbal   Consent given by:  Patient   Risks discussed:  Infection, pain, poor cosmetic result, poor wound healing and retained foreign body   Alternatives discussed:  No treatment and delayed treatment Anesthesia (see MAR for exact dosages):    Anesthesia method:  Local infiltration   Local anesthetic:  Lidocaine 1% WITH epi Laceration details:    Location:  Face   Face location:  Nose   Length (cm):  2 Repair type:    Repair type:  Simple Pre-procedure details:    Preparation:  Patient was prepped and draped in usual sterile  fashion Exploration:    Contaminated: no   Treatment:    Area cleansed with:  Saline   Amount of cleaning:  Standard Skin repair:    Repair method:  Sutures   Suture size:  5-0   Suture material:  Nylon   Suture technique:  Simple interrupted   Number of sutures:  3 Approximation:    Approximation:  Close Post-procedure details:    Patient tolerance of procedure:  Tolerated well, no immediate complications .Marland KitchenLaceration Repair Date/Time: 11/24/2018 7:07 PM Performed by: Hayden Rasmussen, MD Authorized by: Hayden Rasmussen, MD   Consent:    Consent obtained:  Verbal   Consent given by:  Patient   Risks discussed:  Infection, pain, poor cosmetic result, poor wound healing and retained foreign body   Alternatives discussed:  No treatment and delayed treatment Anesthesia (see MAR for exact dosages):    Anesthesia method:  Local infiltration   Local anesthetic:  Lidocaine 1% w/o epi Laceration details:    Location:  Finger   Finger location:  L long finger   Length (cm):  3 Repair type:    Repair type:  Simple Exploration:    Wound exploration: wound explored through full range of motion   Treatment:    Area cleansed with:  Saline   Amount of cleaning:  Standard Skin repair:    Repair method:  Sutures   Suture size:  5-0  Suture material:  Nylon   Suture technique:  Simple interrupted   Number of sutures:  3 Approximation:    Approximation:  Close Post-procedure details:    Patient tolerance of procedure:  Tolerated well, no immediate complications .Marland KitchenLaceration Repair Date/Time: 11/24/2018 7:07 PM Performed by: Hayden Rasmussen, MD Authorized by: Hayden Rasmussen, MD   Consent:    Consent obtained:  Verbal   Consent given by:  Patient   Risks discussed:  Infection, pain, poor cosmetic result, poor wound healing and retained foreign body   Alternatives discussed:  No treatment and delayed treatment Anesthesia (see MAR for exact dosages):    Anesthesia method:   None Laceration details:    Location:  Face   Face location:  L cheek   Length (cm):  2 Repair type:    Repair type:  Simple Treatment:    Area cleansed with:  Saline Skin repair:    Repair method:  Tissue adhesive Approximation:    Approximation:  Close Post-procedure details:    Patient tolerance of procedure:  Tolerated well, no immediate complications   (including critical care time)  Medications Ordered in ED Medications  lidocaine-EPINEPHrine (XYLOCAINE W/EPI) 2 %-1:200000 (PF) injection 20 mL (20 mLs Infiltration Given 11/24/18 1917)     Initial Impression / Assessment and Plan / ED Course  I have reviewed the triage vital signs and the nursing notes.  Pertinent labs & imaging results that were available during my care of the patient were reviewed by me and considered in my medical decision making (see chart for details).      ddx - facial fracture, head bleed, complex lacerations, intraoral bleeding, dental trauma  Final Clinical Impressions(s) / ED Diagnoses   Final diagnoses:  Laceration of upper lip with complication, initial encounter  Laceration of nose, initial encounter  Laceration of right cheek, initial encounter  Laceration of left middle finger without foreign body without damage to nail, initial encounter  Abrasions of multiple sites  Fall, initial encounter    ED Discharge Orders    None       Hayden Rasmussen, MD 11/25/18 [phone]

## 2018-11-24 NOTE — ED Notes (Signed)
ED Provider at bedside. 

## 2018-11-24 NOTE — ED Notes (Signed)
Laceration repairs to finger and nose covered with bacitracin and dressings applied. Pt to call daughter for ride home.

## 2018-11-24 NOTE — ED Notes (Signed)
Pt transported to imaging.

## 2018-11-25 ENCOUNTER — Encounter: Payer: Self-pay | Admitting: Family Medicine

## 2018-11-25 ENCOUNTER — Telehealth: Payer: Self-pay | Admitting: Family Medicine

## 2018-11-25 NOTE — Telephone Encounter (Signed)
I replied to his MyChart message earlier today. He will need suture removal in 5-7 days, so will need actual office visit next week (unless he is planning to see plastic surgeon, or someone else.  I do not think he was referred elsewhere by ER, was told to f/u here.

## 2018-11-25 NOTE — Telephone Encounter (Signed)
Pts wife called and stated that pt just got out of the hospital and needs to set up a visit. Pt had a fall  broke some teeth and had to get stitches in his face. Just wanted to know if u wanted to set up a virtual visit with pt or set up an appointment with him later.

## 2018-11-25 NOTE — Progress Notes (Signed)
Subjective:  Mr. [place] presents to clinic with cc of  painful, thick, discolored, elongated toenails 1-5 b/l that become tender and cannot cut because of thickness. He also has painful lesion plantar aspect left foot.  Rita Ohara, MD is his PCP.    No Known Allergies   Objective:  Physical Examination: Neurovascular status intact    Dermatological: Pedal skin with normal temperature, turgor, texture, and tone. Thick,discolored brittle toenails 1-5 b/l Porokeratotic lesion submetatarsal head 1 left foot.  Hyperkeratotic lesion submetatarsal head 4 left foot with subdermal pinpoint capillaries consistent with verruca plantaris. Pintpoint bleeding on light paring.   Assessment: Mycotic nail infection with pain 1-5 b/l Verruca plantaris left foot submet head 4 Porokeratotic lesions submetatarsal head 1 left  Plan: 1. Debride painful toenails 1-5 b/l with no iatrogenic bleeding. 2. Debride verruca with sterile blade. Salinocaine applied to lesion. Patient instructed to remove in 6 hours. Prescription written for  salicylic acid-lactic acid 17 % external solution to be applied to area once daily for one month. 3. Porokeratotic lesions pared and enucleated without complication. 4. Continue soft supportive shoe gear daily 5. Report any pedal injuries to medical professional immediately 6. Follow up 2 months.

## 2018-11-25 NOTE — Telephone Encounter (Signed)
Will call and schedule something for pt

## 2018-11-29 ENCOUNTER — Encounter: Payer: Self-pay | Admitting: Family Medicine

## 2018-11-30 NOTE — Progress Notes (Signed)
Chief Complaint  Patient presents with  . Suture / Staple Removal    suture removal.     Patient presents for ER follow-up, wound check and suture removal. He fell while walking outside on 11/24/2018.  He reports he felt lightheaded, off-balanced while out walking, realized he was going to pass out, tried to get over to the grass, but didn't make it there. He sustained lacerations on his face (nose, cheek, lip) and mouth, and also his left middle finger. He recalls reaching out with both hands.  His hands were swollen and bruised, but have improved. The laceration to his upper inner lip was jagged, and arterial (3-4cm), and was closed with three figure of 8's, with chromic gut. ER notes report that vermilion border was not well approximated, and there was significant swelling.    Had x-ray of L hand, maxillofacial and head CT.  No acute findings noted.  When asked further about his dizziness--he reports that this has occurred in the past, about one in 5 times that he has been out walking.  He was walking in the afternoon, admits he may not be drinking enough water.   Dr. Stephanie Coup (his daughter works for him) took at look at him over the weekend, and suggested using gauze with water and vinegar x 5 minutes BID to the lip and nose.  He denies any associated chest pain, palpitations, tachycardia or shortness of breath.  He reports that he has had some fatigue, feels like he "runs out of gas"--states feeling like this x 6 months, but he has actually been reporting this for much longer.  Denies any recent change.  Recent TSH was normal. Denies any change in hair/skin/bowels. Lab Results  Component Value Date   TSH 2.080 08/11/2018   He last saw Dr. Stanford Breed last in February.  He reported monitor in 07/2018 showed sinus bradycardia, NSR, sinus tach and PAC's. At his last visit he had reported that near syncope symptoms had resolved (which clearly they have not).   PMH, PSH, SH reviewed  Outpatient  Encounter Medications as of 12/01/2018  Medication Sig Note  . aspirin 81 MG tablet Take 81 mg by mouth at bedtime.  09/07/2017: Daily   . atorvastatin (LIPITOR) 10 MG tablet TAKE 1 TABLET BY MOUTH  DAILY   . Cholecalciferol (VITAMIN D3) 2000 units TABS Take 1 tablet by mouth daily.   . citalopram (CELEXA) 20 MG tablet Take 1 tablet (20 mg total) by mouth daily.   . Ferrous Sulfate (IRON) 325 (65 FE) MG TABS Take 1 tablet by mouth every other day. Reported on 11/08/2015   . ipratropium (ATROVENT) 0.03 % nasal spray USE 2 SPRAYS IN EACH NOSTRIL TWICE A DAY AS NEEDED   . irbesartan (AVAPRO) 300 MG tablet Take 1 tablet (300 mg total) by mouth daily.   . metoprolol tartrate (LOPRESSOR) 25 MG tablet Take 1 tablet (25 mg total) by mouth 2 (two) times daily.   Marland Kitchen omeprazole (PRILOSEC) 20 MG capsule TAKE 1 CAPSULE BY MOUTH  EVERY OTHER DAY   . SYNTHROID 50 MCG tablet TAKE 1 TABLET BY MOUTH  DAILY BEFORE BREAKFAST   . triamcinolone cream (KENALOG) 0.1 % Apply 1 application topically 2 (two) times daily.   . salicylic acid-lactic acid 17 % external solution Apply to left foot wart once daily (Patient not taking: Reported on 12/01/2018)    No facility-administered encounter medications on file as of 12/01/2018.    No Known Allergies  ROS:  No fever,  chills, headache,  Currently doesn't feel dizzy. No chest pain, palpitations, shortness of breath bowel changes or other complaints. See HPI   PHYSICAL EXAM:  BP (!) 180/80   Pulse 68   Temp 98.3 F (36.8 C) (Tympanic)   Ht 5\' 4"  (1.626 m)   Wt 135 lb 12.8 oz (61.6 kg)   BMI 23.31 kg/m  136/78 on repeat by MD  Elderly appearing male, accompanied by his daughter, in no distress HEENT: conjunctiva and sclera are clear, EOMI. He has significant scabbing across most of his nose, upper lip and area on his right forehead, right cheek and right chin Bridge of nose--large scab. This was slowly cleaned, scabs removed. Only two sutures were noted (? If other came  out with the scabs).  Sutures were easily removed. Scabs were also debrided from his upper lip, and other areas.  One hard scab remained adherent to the right lateral nose. Upper lip--healed laceration, with some swelling. On the inner lip and mouth the wound has healed, no bleeding or scabs.  L middle finger: 3 simple interrupted sutures on the palmar aspect of PIP. No erythema, crusting, nontender. Neck: no lymphadenopathy, no c-spine tenderness Heart: regular rate and rhythm.  Pauses noted when taking blood pressure, but only one pause noted during extensive cardiac exam.  Rate of 60. Lungs: clear bilaterally Extremities:  There is bruising on both hands, most notable at the right thumb. Psych: normal mood, affect, hygiene and grooming Neuro: alert and oriented, cranial nerves intact. Gait is normal.  ASSESSMENT/PLAN:  Facial laceration, subsequent encounter - sutures removed; cleansed, scabs removed. mouth laceration has absorbable sutures, healing well, but would benefit from plastic sx eval for better outcome - Plan: Ambulatory referral to Plastic Surgery  Visit for suture removal - Needs to return next week for suture removal from the finger. Nasal laceration sutures removed today without complication  Near syncope - recently saw cardiologist. Advised to increase fluid intake, cut back on tea intake. Consider f/u with Dr. Stanford Breed if persists   Return Monday for removal of stitches fom the finger and med check Return fasting, labs due. Was going to do labs today (for eval of near syncope, had what sounds to be significant bleeding from mouth lesion), but see that he is due for fasting labs and has appt next week.  Will return fasting for med check and suture removal on Monday.  Over 30-40 minute visit (significant time in cleaning wounds, removing scabs from nose in order to find the stitches to be removed).  Also briefly discussed his wife (who canceled her appt)--having disturbing  dreams on 10mg  aricept. She will try cutting back the dose, and if no improvement, to f/u (virtual okay) to discuss other medications (ie namenda). Pt states that she is sometimes so disturbed by her dreams that she has to take xanax when she wakes up. Advised pt and daughter of this info.     It is important to stay well hydrated.  Drink enough water to keep your urine a very pale yellow to clear color. Cut back on your tea and drink more water.  Please monitor your blood pressure and pulse and record these.  Fax/mail the list in 1-2 weeks for my review. Document any times that you may be feeling weak or dizzy. We may need you to follow up with your cardiologist if symptoms continue with your walking.

## 2018-12-01 ENCOUNTER — Other Ambulatory Visit: Payer: Self-pay

## 2018-12-01 ENCOUNTER — Encounter: Payer: Self-pay | Admitting: Family Medicine

## 2018-12-01 ENCOUNTER — Ambulatory Visit (INDEPENDENT_AMBULATORY_CARE_PROVIDER_SITE_OTHER): Payer: Medicare Other | Admitting: Family Medicine

## 2018-12-01 VITALS — BP 180/80 | HR 68 | Temp 98.3°F | Ht 64.0 in | Wt 135.8 lb

## 2018-12-01 DIAGNOSIS — R55 Syncope and collapse: Secondary | ICD-10-CM

## 2018-12-01 DIAGNOSIS — S0181XD Laceration without foreign body of other part of head, subsequent encounter: Secondary | ICD-10-CM

## 2018-12-01 DIAGNOSIS — Z4802 Encounter for removal of sutures: Secondary | ICD-10-CM

## 2018-12-01 NOTE — Patient Instructions (Addendum)
Continue to apply soaks to the right side of the nose and lip to loosen the remaining scabs. It has been long enough, that you can also start washing the right cheek (skin adhesive has been in place for a week now, should have healed). Continue to apply bacitracin to the finger laceration (regular bandage is fine). Return Monday for suture removal from the finger, as well as the follow-up on moods. Come fasting for that visit, as labs are due (including cholesterol panel).

## 2018-12-04 NOTE — Progress Notes (Signed)
Chief Complaint  Patient presents with  . Hypertension    follow up on HTN, moods and labs.     Patient presents for removal of sutures from his left 3rd finger. He has not had any problems related to this wound--no pain, drainage, redness. He was seen last week to have facial sutures removed. He has appt scheduled with plastic surgeon for tomorrow. No further dizzy spells. His facial wounds are healing well. His dentures cracked so is unable to wear them.  He is also here for follow-up on depression.  He was started on Citalopram in January.  He saw a therapist a few times (no longer goes, was helpful).  Much of his depression stems from his wife's cognitive decline, and they had been arguing a lot.  He is tolerating the citalopram without side effects, and reports moods are improved (so he is wondering if he still needs the medication).  Daughter reports "he is so much better".  She accompanies him at today's visit.  Hypertension: BP's have been running 145/80 typically (didn't bring list).  He reports rarely seeing BP below 145, up to 152. He denies headaches, chest pain, DOE.He is under the care of Dr. Stanford Breed. Current medications include irbesartan 300mg  daily and metoprolol 25mg  BID. (no longer on amlodipine) He reports some dizziness with walking ("every 5th time he walks"), and near syncope (vs syncope) was the cause of his fall leading to the facial and finger lacerations..  Hyperlipidemia--toleratingatorvastatin without side effects. He follows a lowfat, low cholesterol diet. He is fasting for labs today.  Hypothyroidism: Denies any symptoms--no changes in weight, moods, hair/skin or bowels.  Some decrease in energy over time, just feels like he is "slowing down" some, related to age, "runs out of gas". He is compliant with taking his thyroid medication on an empty stomach, separate from other medications. Lab Results  Component Value Date   TSH 2.080 08/11/2018   GERD: He  reports this is stable, doing well with every other day prilosec OTC.  Denies dysphagia.  IFG:  A1c on last check was 6%. He tries to limit his sugars.   PMH, PSH, SH reviewed  Outpatient Encounter Medications as of 12/06/2018  Medication Sig Note  . aspirin 81 MG tablet Take 81 mg by mouth at bedtime.  09/07/2017: Daily   . atorvastatin (LIPITOR) 10 MG tablet TAKE 1 TABLET BY MOUTH  DAILY   . Cholecalciferol (VITAMIN D3) 2000 units TABS Take 1 tablet by mouth daily.   . citalopram (CELEXA) 20 MG tablet Take 1 tablet (20 mg total) by mouth daily.   . Ferrous Sulfate (IRON) 325 (65 FE) MG TABS Take 1 tablet by mouth every other day. Reported on 11/08/2015   . ipratropium (ATROVENT) 0.03 % nasal spray USE 2 SPRAYS IN EACH NOSTRIL TWICE A DAY AS NEEDED   . irbesartan (AVAPRO) 300 MG tablet Take 1 tablet (300 mg total) by mouth daily.   . metoprolol tartrate (LOPRESSOR) 25 MG tablet Take 1 tablet (25 mg total) by mouth 2 (two) times daily.   Marland Kitchen omeprazole (PRILOSEC) 20 MG capsule TAKE 1 CAPSULE BY MOUTH  EVERY OTHER DAY   . SYNTHROID 50 MCG tablet TAKE 1 TABLET BY MOUTH  DAILY BEFORE BREAKFAST   . triamcinolone cream (KENALOG) 0.1 % Apply 1 application topically 2 (two) times daily.   . salicylic acid-lactic acid 17 % external solution Apply to left foot wart once daily (Patient not taking: Reported on 12/06/2018)    No  facility-administered encounter medications on file as of 12/06/2018.    ROS:  No fever, chills, URI symptoms, cough, shortness of breath, chest pain.  No recent dizziness (just episode related to fall).  No nausea, vomiting, heartburn, dysphagia or bowel changes. No urinary complaints. No bleeding.  +bruising related to his injury, improving.  Moods are stable, see HPI.   Slight runny nose Slight unsteady gait unchanged   PHYSICAL EXAM:  BP (!) 170/60   Pulse 60   Ht 5\' 4"  (1.626 m)   Wt 137 lb (62.1 kg)   BMI 23.52 kg/m   Repeat by MD was 174/80  Wt Readings from Last 3  Encounters:  12/01/18 135 lb 12.8 oz (61.6 kg)  11/24/18 140 lb (63.5 kg)  10/27/18 140 lb (63.5 kg)   Elderly male, in no distress. In fair spirits, accompanied by his daughter Ivin Booty. HEENT: conjunctiva and sclera are clear, EOMI.  OP is clear, nose without drainage. Wounds are improving--still has large scab present on the bridge of his nose (where glasses sit), others are healing nicely. Swelling in upper lip is significantly improved, and well-healed. Neck: no lymphadenopathy, thyromegaly or mass, no bruit Heart: regular rate and rhythm. A few extra beats were noted, regular rhythm overall. Lungs: bibasilar crackles are noted bilaterally. Clear elsewhere, no wheezes or ronchi Abdomen: soft, nontender,no masses. Back: nontender along spine, no CVA tenderness Extremities: no edema.  Wound on left 3rd finger shows good alignment, no erythema, drainage or swelling.  Sutures were removed without complication. Psych: normal mood, affect, eye contact, speech, hygiene and grooming Neuro: alert and oriented, cranial nerves intact. Gait is slow, slightly wide-based. No tremor  PHQ-9 score of 3 (daily fatigue, unrelated to depression, unchanged).  Lab Results  Component Value Date   HGBA1C 5.7 (A) 12/06/2018    ASSESSMENT/PLAN:  Visit for suture removal - sutures removed from left 3rd finger, wound is healing well without complication  Depression, major, single episode, moderate ([place]) - significantly improved. Continue citalopram, discussed at length the need to continue (stressor of wife's health will not improve)  Impaired fasting glucose - Plan: HgB A1c, Comprehensive metabolic panel  Hypothyroidism, unspecified type - adequately replaced per last TSH, currently without symptoms; continue current dose  Gastroesophageal reflux disease, esophagitis presence not specified - well controlled, continue qod PPI  Essential hypertension - BP elevated--may have white coat component, but BP at  home are also above goal. Will forward note to Dr. Stanford Breed who has been managing BP meds - Plan: Comprehensive metabolic panel  Impaired fasting glucose - counseled re: diet, exercise. Improved, continue to monitor - Plan: HgB A1c, Comprehensive metabolic panel  Medication monitoring encounter - Plan: Comprehensive metabolic panel, Lipid panel  Pure hypercholesterolemia - on statin, due for recheck lipids today - Plan: Lipid panel   F/u as scheduled for AWV in June.  Keep track of your blood pressure and pulse, and touch base with your cardiologist with these values. Try and get 30 minutes of exercise daily, if you can (can be in 10-15 minute intervals). Be sure to take deep breaths regularly--either with exercise, or using the incentive spirometer you may still have at home. (fill those lungs periodically with deeper breaths).   CC NOTE TO CRENSHAW

## 2018-12-06 ENCOUNTER — Telehealth: Payer: Self-pay | Admitting: Plastic Surgery

## 2018-12-06 ENCOUNTER — Encounter: Payer: Self-pay | Admitting: Family Medicine

## 2018-12-06 ENCOUNTER — Other Ambulatory Visit: Payer: Self-pay

## 2018-12-06 ENCOUNTER — Ambulatory Visit (INDEPENDENT_AMBULATORY_CARE_PROVIDER_SITE_OTHER): Payer: Medicare Other | Admitting: Family Medicine

## 2018-12-06 VITALS — BP 170/60 | HR 60 | Ht 64.0 in | Wt 137.0 lb

## 2018-12-06 DIAGNOSIS — R7301 Impaired fasting glucose: Secondary | ICD-10-CM | POA: Insufficient documentation

## 2018-12-06 DIAGNOSIS — E7800 Pure hypercholesterolemia, unspecified: Secondary | ICD-10-CM | POA: Diagnosis not present

## 2018-12-06 DIAGNOSIS — Z4802 Encounter for removal of sutures: Secondary | ICD-10-CM | POA: Diagnosis not present

## 2018-12-06 DIAGNOSIS — F321 Major depressive disorder, single episode, moderate: Secondary | ICD-10-CM

## 2018-12-06 DIAGNOSIS — Z5181 Encounter for therapeutic drug level monitoring: Secondary | ICD-10-CM | POA: Diagnosis not present

## 2018-12-06 DIAGNOSIS — I10 Essential (primary) hypertension: Secondary | ICD-10-CM

## 2018-12-06 DIAGNOSIS — K219 Gastro-esophageal reflux disease without esophagitis: Secondary | ICD-10-CM

## 2018-12-06 DIAGNOSIS — E039 Hypothyroidism, unspecified: Secondary | ICD-10-CM | POA: Diagnosis not present

## 2018-12-06 DIAGNOSIS — S61213D Laceration without foreign body of left middle finger without damage to nail, subsequent encounter: Secondary | ICD-10-CM | POA: Diagnosis not present

## 2018-12-06 LAB — LIPID PANEL
Chol/HDL Ratio: 2.1 ratio (ref 0.0–5.0)
Cholesterol, Total: 148 mg/dL (ref 100–199)
HDL: 69 mg/dL (ref >39)
LDL Calculated: 67 mg/dL (ref 0–99)
Triglycerides: 61 mg/dL (ref 0–149)
VLDL Cholesterol Cal: 12 mg/dL (ref 5–40)

## 2018-12-06 LAB — COMPREHENSIVE METABOLIC PANEL
ALT: 15 IU/L (ref 0–44)
AST: 20 IU/L (ref 0–40)
Albumin/Globulin Ratio: 1.4 (ref 1.2–2.2)
Albumin: 3.9 g/dL (ref 3.6–4.6)
Alkaline Phosphatase: 73 IU/L (ref 39–117)
BUN/Creatinine Ratio: 14 (ref 10–24)
BUN: 14 mg/dL (ref 8–27)
Bilirubin Total: 0.6 mg/dL (ref 0.0–1.2)
CO2: 27 mmol/L (ref 20–29)
Calcium: 9.7 mg/dL (ref 8.6–10.2)
Chloride: 96 mmol/L (ref 96–106)
Creatinine, Ser: 1 mg/dL (ref 0.76–1.27)
GFR calc Af Amer: 80 mL/min/{1.73_m2} (ref >59)
GFR calc non Af Amer: 69 mL/min/{1.73_m2} (ref >59)
Globulin, Total: 2.7 g/dL (ref 1.5–4.5)
Glucose: 93 mg/dL (ref 65–99)
Potassium: 4.6 mmol/L (ref 3.5–5.2)
Sodium: 137 mmol/L (ref 134–144)
Total Protein: 6.6 g/dL (ref 6.0–8.5)

## 2018-12-06 LAB — POCT GLYCOSYLATED HEMOGLOBIN (HGB A1C): Hemoglobin A1C: 5.7 % — AB (ref 4.0–5.6)

## 2018-12-06 NOTE — Patient Instructions (Addendum)
  Keep track of your blood pressure and pulse, and touch base with your cardiologist with these values.  Try and get 30 minutes of exercise daily, if you can (can be in 10-15 minute intervals).  Be sure to take deep breaths regularly--either with exercise, or using the incentive spirometer you may still have at home. (fill those lungs periodically with deeper breaths).

## 2018-12-06 NOTE — Telephone Encounter (Signed)
Called patient to confirm appointment scheduled for tomorrow. Patient answered the following questions: °1.Has the patient traveled outside of the state of [place] at all within the past 6 weeks? No °2.Does the patient have a fever or cough at all? No °3.Has the patient been tested for COVID? Had a positive COVID test? No °4. Has the patient been in contact with anyone who has tested positive? No ° °

## 2018-12-07 ENCOUNTER — Encounter: Payer: Self-pay | Admitting: Plastic Surgery

## 2018-12-07 ENCOUNTER — Other Ambulatory Visit: Payer: Self-pay

## 2018-12-07 ENCOUNTER — Ambulatory Visit (INDEPENDENT_AMBULATORY_CARE_PROVIDER_SITE_OTHER): Payer: Medicare Other | Admitting: Plastic Surgery

## 2018-12-07 DIAGNOSIS — S01511A Laceration without foreign body of lip, initial encounter: Secondary | ICD-10-CM

## 2018-12-07 NOTE — Progress Notes (Signed)
Patient ID: Danny Frederick, male    DOB: July 20, 1934, 85 y.o.   MRN: [id number]   Chief Complaint  Patient presents with  . Advice Only    for lip laceration  . Skin Problem    The patient is an 85 yrs old male here with his daughter for evaluation of an upper lip laceration.  The fell 2 weeks ago and lacerated his upper lip and nose.  As far as is broken.  He is not sure why he fell either.  He thinks because he does not drink enough water.  He was repaired in the ER with Vicryl sutures to his upper lip.  He has a laceration on the bridge of his nose.  Both are healing nicely.  His medications were reviewed. He has a history of thoracic aortic aneurysm spinal stenosis and atherosclerosis.  No sign of infection. His daughter works for Dr. Stephanie Coup.   Review of Systems  Constitutional: Positive for activity change. Negative for appetite change.  Respiratory: Negative.   Cardiovascular: Negative.   Gastrointestinal: Negative.   Musculoskeletal: Negative.     Past Medical History:  Diagnosis Date  . Allergic conjunctivitis   . Allergic rhinitis, cause unspecified    on allergy shots (Dr. Orvil Feil)  . Atherosclerosis of aorta ([facility])    noted on CT angio of abd/pelvis, in many vessels  . BPH (benign prostatic hypertrophy)   . Diverticulosis of colon 12/09  . Foot drop, right   . GERD (gastroesophageal reflux disease)   . Hearing loss in left ear    both ears now  . History of Clostridium difficile    multiple times 2016--s/p fecal transplant (Dr. Baxter Flattery).  Marland Kitchen Hx of echocardiogram    Echo (03/2014):  Mild LVH, EF 50-55%, no RWMA, Gr 1 DD, mild MR, mild reduced RVSF  . Hypertension   . Internal hemorrhoids   . Iron deficiency anemia, unspecified 6/09  . Mitral valve prolapse    h/o; normal echo 12/2011 with no MVP seen  . 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 Right ventricular dysfunction 01/23/2014   Secondary to obstruction of main pulmonary artery  . Ruptured thoracic  aortic aneurysm ([place]) 01/23/2014  . S/P ascending aortic aneurysm repair 01/24/2014  . Unspecified hypothyroidism     Past Surgical History:  Procedure Laterality Date  . CERVICAL LAMINECTOMY  1972   C5-6  . COLONOSCOPY WITH PROPOFOL N/A 11/09/2014   Procedure: COLONOSCOPY WITH PROPOFOL;  Surgeon: Jerene Bears, MD;  Location: [facility];  Service: Gastroenterology;  Laterality: N/A;  . ESOPHAGOGASTRODUODENOSCOPY  10/31/08   normal; Dr. Edison Nasuti  . FECAL TRANSPLANT N/A 11/09/2014   Procedure: FECAL TRANSPLANT;  Surgeon: Jerene Bears, MD;  Location: [facility];  Service: Gastroenterology;  Laterality: N/A;  . HEMORRHOIDECTOMY WITH HEMORRHOID BANDING  04/07/13  . hemorroidal banding  04/07/2013   x3-Dr.Eric Redmond Pulling  . PROSTATE SURGERY  2007   photovaporization  . ROTATOR CUFF REPAIR  10/2008   left; Dr. Onnie Graham  . SPINE SURGERY  2006   L4-5 disk surgery  . SPINE SURGERY  04/2013   L4-5, L5-S1 fusion.  Dr. Christella Noa  . THORACIC AORTIC ANEURYSM REPAIR N/A 01/23/2014   Procedure: THORACIC ASCENDING ANEURYSM REPAIR ([facility]);  Surgeon: Rexene Alberts, MD;  Location: [facility];  Service: Open Heart Surgery;  Laterality: N/A;  . TONSILLECTOMY        Current Outpatient Medications:  .  aspirin 81 MG tablet, Take 81 mg by mouth  at bedtime. , Disp: , Rfl:  .  atorvastatin (LIPITOR) 10 MG tablet, TAKE 1 TABLET BY MOUTH  DAILY, Disp: 90 tablet, Rfl: 0 .  Cholecalciferol (VITAMIN D3) 2000 units TABS, Take 1 tablet by mouth daily., Disp: , Rfl:  .  citalopram (CELEXA) 20 MG tablet, Take 1 tablet (20 mg total) by mouth daily., Disp: 90 tablet, Rfl: 1 .  Ferrous Sulfate (IRON) 325 (65 FE) MG TABS, Take 1 tablet by mouth every other day. Reported on 11/08/2015, Disp: , Rfl:  .  ipratropium (ATROVENT) 0.03 % nasal spray, USE 2 SPRAYS IN EACH NOSTRIL TWICE A DAY AS NEEDED, Disp: , Rfl:  .  irbesartan (AVAPRO) 300 MG tablet, Take 1 tablet (300 mg total) by mouth daily., Disp: 90 tablet, Rfl: 3 .  metoprolol tartrate  (LOPRESSOR) 25 MG tablet, Take 1 tablet (25 mg total) by mouth 2 (two) times daily., Disp: 180 tablet, Rfl: 3 .  omeprazole (PRILOSEC) 20 MG capsule, TAKE 1 CAPSULE BY MOUTH  EVERY OTHER DAY, Disp: 45 capsule, Rfl: 0 .  salicylic acid-lactic acid 17 % external solution, Apply to left foot wart once daily, Disp: 14 mL, Rfl: 0 .  SYNTHROID 50 MCG tablet, TAKE 1 TABLET BY MOUTH  DAILY BEFORE BREAKFAST, Disp: 90 tablet, Rfl: 0 .  triamcinolone cream (KENALOG) 0.1 %, Apply 1 application topically 2 (two) times daily., Disp: 454 g, Rfl: 0   Objective:   Vitals:   12/07/18 1035  BP: (!) 179/82  Pulse: 62  Temp: 98.1 F (36.7 C)  SpO2: 96%    Physical Exam Vitals signs and nursing note reviewed.  Constitutional:      Appearance: Normal appearance.  HENT:     Head: Normocephalic.   Cardiovascular:     Rate and Rhythm: Normal rate.  Pulmonary:     Effort: Pulmonary effort is normal.  Skin:    General: Skin is warm.  Neurological:     Mental Status: He is alert and oriented to person, place, and time.  Psychiatric:        Mood and Affect: Mood normal.        Behavior: Behavior normal.     Assessment & Plan:  Lip laceration, initial encounter Can remove the vicryl in the upper lip in next week if still present.  Should be clear for dental work in next week. Pictures taken with the patient's consent and placed in the chart.   Day, DO

## 2018-12-08 ENCOUNTER — Encounter: Payer: Medicare Other | Admitting: Family Medicine

## 2018-12-11 ENCOUNTER — Emergency Department (HOSPITAL_COMMUNITY): Payer: Medicare Other

## 2018-12-11 ENCOUNTER — Inpatient Hospital Stay (HOSPITAL_COMMUNITY): Payer: Medicare Other

## 2018-12-11 ENCOUNTER — Inpatient Hospital Stay (HOSPITAL_COMMUNITY)
Admission: EM | Admit: 2018-12-11 | Discharge: 2018-12-13 | DRG: 065 | Disposition: A | Discharge status: Rehabilitation Facility | Payer: Medicare Other | Attending: Neurology | Admitting: Neurology

## 2018-12-11 ENCOUNTER — Encounter (HOSPITAL_COMMUNITY): Payer: Self-pay

## 2018-12-11 ENCOUNTER — Other Ambulatory Visit: Payer: Self-pay

## 2018-12-11 DIAGNOSIS — K219 Gastro-esophageal reflux disease without esophagitis: Secondary | ICD-10-CM | POA: Diagnosis present

## 2018-12-11 DIAGNOSIS — R55 Syncope and collapse: Secondary | ICD-10-CM | POA: Diagnosis present

## 2018-12-11 DIAGNOSIS — R202 Paresthesia of skin: Secondary | ICD-10-CM | POA: Diagnosis not present

## 2018-12-11 DIAGNOSIS — Z820 Family history of epilepsy and other diseases of the nervous system: Secondary | ICD-10-CM

## 2018-12-11 DIAGNOSIS — I69193 Ataxia following nontraumatic intracerebral hemorrhage: Secondary | ICD-10-CM | POA: Diagnosis not present

## 2018-12-11 DIAGNOSIS — I69198 Other sequelae of nontraumatic intracerebral hemorrhage: Secondary | ICD-10-CM | POA: Diagnosis not present

## 2018-12-11 DIAGNOSIS — R260 Ataxic gait: Secondary | ICD-10-CM | POA: Diagnosis present

## 2018-12-11 DIAGNOSIS — F39 Unspecified mood [affective] disorder: Secondary | ICD-10-CM | POA: Diagnosis present

## 2018-12-11 DIAGNOSIS — I10 Essential (primary) hypertension: Secondary | ICD-10-CM | POA: Diagnosis not present

## 2018-12-11 DIAGNOSIS — G8191 Hemiplegia, unspecified affecting right dominant side: Secondary | ICD-10-CM | POA: Diagnosis not present

## 2018-12-11 DIAGNOSIS — E039 Hypothyroidism, unspecified: Secondary | ICD-10-CM | POA: Diagnosis present

## 2018-12-11 DIAGNOSIS — H9192 Unspecified hearing loss, left ear: Secondary | ICD-10-CM | POA: Diagnosis present

## 2018-12-11 DIAGNOSIS — Z79890 Hormone replacement therapy (postmenopausal): Secondary | ICD-10-CM

## 2018-12-11 DIAGNOSIS — I341 Nonrheumatic mitral (valve) prolapse: Secondary | ICD-10-CM | POA: Diagnosis present

## 2018-12-11 DIAGNOSIS — R42 Dizziness and giddiness: Secondary | ICD-10-CM | POA: Diagnosis not present

## 2018-12-11 DIAGNOSIS — Z87891 Personal history of nicotine dependence: Secondary | ICD-10-CM | POA: Diagnosis not present

## 2018-12-11 DIAGNOSIS — Z825 Family history of asthma and other chronic lower respiratory diseases: Secondary | ICD-10-CM

## 2018-12-11 DIAGNOSIS — W19XXXA Unspecified fall, initial encounter: Secondary | ICD-10-CM | POA: Diagnosis not present

## 2018-12-11 DIAGNOSIS — I69393 Ataxia following cerebral infarction: Secondary | ICD-10-CM | POA: Diagnosis not present

## 2018-12-11 DIAGNOSIS — R29704 NIHSS score 4: Secondary | ICD-10-CM | POA: Diagnosis present

## 2018-12-11 DIAGNOSIS — Z7982 Long term (current) use of aspirin: Secondary | ICD-10-CM | POA: Diagnosis not present

## 2018-12-11 DIAGNOSIS — Z8673 Personal history of transient ischemic attack (TIA), and cerebral infarction without residual deficits: Secondary | ICD-10-CM

## 2018-12-11 DIAGNOSIS — E782 Mixed hyperlipidemia: Secondary | ICD-10-CM | POA: Diagnosis present

## 2018-12-11 DIAGNOSIS — Z66 Do not resuscitate: Secondary | ICD-10-CM | POA: Diagnosis present

## 2018-12-11 DIAGNOSIS — I619 Nontraumatic intracerebral hemorrhage, unspecified: Secondary | ICD-10-CM | POA: Diagnosis present

## 2018-12-11 DIAGNOSIS — Z8249 Family history of ischemic heart disease and other diseases of the circulatory system: Secondary | ICD-10-CM | POA: Diagnosis not present

## 2018-12-11 DIAGNOSIS — Z833 Family history of diabetes mellitus: Secondary | ICD-10-CM

## 2018-12-11 DIAGNOSIS — I610 Nontraumatic intracerebral hemorrhage in hemisphere, subcortical: Secondary | ICD-10-CM | POA: Diagnosis present

## 2018-12-11 DIAGNOSIS — N400 Enlarged prostate without lower urinary tract symptoms: Secondary | ICD-10-CM | POA: Diagnosis present

## 2018-12-11 DIAGNOSIS — R0902 Hypoxemia: Secondary | ICD-10-CM | POA: Diagnosis not present

## 2018-12-11 DIAGNOSIS — M21371 Foot drop, right foot: Secondary | ICD-10-CM | POA: Diagnosis present

## 2018-12-11 DIAGNOSIS — S06360A Traumatic hemorrhage of cerebrum, unspecified, without loss of consciousness, initial encounter: Secondary | ICD-10-CM | POA: Diagnosis not present

## 2018-12-11 DIAGNOSIS — Z823 Family history of stroke: Secondary | ICD-10-CM

## 2018-12-11 DIAGNOSIS — K5900 Constipation, unspecified: Secondary | ICD-10-CM | POA: Diagnosis present

## 2018-12-11 DIAGNOSIS — Z9181 History of falling: Secondary | ICD-10-CM | POA: Diagnosis not present

## 2018-12-11 DIAGNOSIS — K5730 Diverticulosis of large intestine without perforation or abscess without bleeding: Secondary | ICD-10-CM | POA: Diagnosis present

## 2018-12-11 DIAGNOSIS — R262 Difficulty in walking, not elsewhere classified: Secondary | ICD-10-CM | POA: Diagnosis not present

## 2018-12-11 DIAGNOSIS — R531 Weakness: Secondary | ICD-10-CM | POA: Diagnosis not present

## 2018-12-11 DIAGNOSIS — Z818 Family history of other mental and behavioral disorders: Secondary | ICD-10-CM | POA: Diagnosis not present

## 2018-12-11 DIAGNOSIS — Z86711 Personal history of pulmonary embolism: Secondary | ICD-10-CM | POA: Diagnosis not present

## 2018-12-11 DIAGNOSIS — E785 Hyperlipidemia, unspecified: Secondary | ICD-10-CM | POA: Diagnosis present

## 2018-12-11 DIAGNOSIS — I161 Hypertensive emergency: Secondary | ICD-10-CM | POA: Diagnosis not present

## 2018-12-11 DIAGNOSIS — I700 Atherosclerosis of aorta: Secondary | ICD-10-CM | POA: Diagnosis present

## 2018-12-11 DIAGNOSIS — I639 Cerebral infarction, unspecified: Secondary | ICD-10-CM | POA: Diagnosis not present

## 2018-12-11 DIAGNOSIS — I951 Orthostatic hypotension: Secondary | ICD-10-CM | POA: Diagnosis not present

## 2018-12-11 LAB — CBC WITH DIFFERENTIAL/PLATELET
Abs Immature Granulocytes: 0.03 10*3/uL (ref 0.00–0.07)
Basophils Absolute: 0 10*3/uL (ref 0.0–0.1)
Basophils Relative: 1 %
Eosinophils Absolute: 0.2 10*3/uL (ref 0.0–0.5)
Eosinophils Relative: 3 %
HCT: 46.2 % (ref 39.0–52.0)
Hemoglobin: 14.7 g/dL (ref 13.0–17.0)
Immature Granulocytes: 0 %
Lymphocytes Relative: 23 %
Lymphs Abs: 2 10*3/uL (ref 0.7–4.0)
MCH: 27.6 pg (ref 26.0–34.0)
MCHC: 31.8 g/dL (ref 30.0–36.0)
MCV: 86.7 fL (ref 80.0–100.0)
Monocytes Absolute: 0.7 10*3/uL (ref 0.1–1.0)
Monocytes Relative: 8 %
Neutro Abs: 5.5 10*3/uL (ref 1.7–7.7)
Neutrophils Relative %: 65 %
Platelets: 303 10*3/uL (ref 150–400)
RBC: 5.33 MIL/uL (ref 4.22–5.81)
RDW: 13.7 % (ref 11.5–15.5)
WBC: 8.4 10*3/uL (ref 4.0–10.5)
nRBC: 0 % (ref 0.0–0.2)

## 2018-12-11 LAB — COMPREHENSIVE METABOLIC PANEL
ALT: 14 U/L (ref 0–44)
AST: 24 U/L (ref 15–41)
Albumin: 3.7 g/dL (ref 3.5–5.0)
Alkaline Phosphatase: 68 U/L (ref 38–126)
Anion gap: 13 (ref 5–15)
BUN: 11 mg/dL (ref 8–23)
CO2: 25 mmol/L (ref 22–32)
Calcium: 9.4 mg/dL (ref 8.9–10.3)
Chloride: 99 mmol/L (ref 98–111)
Creatinine, Ser: 0.99 mg/dL (ref 0.61–1.24)
GFR calc Af Amer: >60 mL/min (ref >60)
GFR calc non Af Amer: >60 mL/min (ref >60)
Glucose, Bld: 94 mg/dL (ref 70–99)
Potassium: 4.6 mmol/L (ref 3.5–5.1)
Sodium: 137 mmol/L (ref 135–145)
Total Bilirubin: 1.1 mg/dL (ref 0.3–1.2)
Total Protein: 7.8 g/dL (ref 6.5–8.1)

## 2018-12-11 LAB — PROTIME-INR
INR: 1.1 (ref 0.8–1.2)
Prothrombin Time: 13.9 seconds (ref 11.4–15.2)

## 2018-12-11 LAB — URINALYSIS, ROUTINE W REFLEX MICROSCOPIC
Bilirubin Urine: NEGATIVE
Glucose, UA: NEGATIVE mg/dL
Hgb urine dipstick: NEGATIVE
Ketones, ur: NEGATIVE mg/dL
Leukocytes,Ua: NEGATIVE
Nitrite: NEGATIVE
Protein, ur: NEGATIVE mg/dL
Specific Gravity, Urine: 1.008 (ref 1.005–1.030)
pH: 7 (ref 5.0–8.0)

## 2018-12-11 LAB — RAPID URINE DRUG SCREEN, HOSP PERFORMED
Amphetamines: NOT DETECTED
Barbiturates: NOT DETECTED
Benzodiazepines: NOT DETECTED
Cocaine: NOT DETECTED
Opiates: NOT DETECTED
Tetrahydrocannabinol: NOT DETECTED

## 2018-12-11 LAB — I-STAT BETA HCG BLOOD, ED (MC, WL, AP ONLY): I-stat hCG, quantitative: <5 m[IU]/mL (ref <5)

## 2018-12-11 LAB — ETHANOL: Alcohol, Ethyl (B): <10 mg/dL (ref <10)

## 2018-12-11 LAB — TROPONIN I: Troponin I: <0.03 ng/mL (ref <0.03)

## 2018-12-11 LAB — MRSA PCR SCREENING: MRSA by PCR: NEGATIVE

## 2018-12-11 LAB — APTT: aPTT: 28 seconds (ref 24–36)

## 2018-12-11 MED ORDER — LEVOTHYROXINE SODIUM 50 MCG PO TABS
50.0000 ug | ORAL_TABLET | Freq: Every day | ORAL | Status: DC
  Administered 2018-12-12: 50 ug via ORAL
  Filled 2018-12-11 (×2): qty 1

## 2018-12-11 MED ORDER — METOPROLOL TARTRATE 25 MG PO TABS
25.0000 mg | ORAL_TABLET | Freq: Two times a day (BID) | ORAL | Status: DC
  Administered 2018-12-11 – 2018-12-13 (×4): 25 mg via ORAL
  Filled 2018-12-11 (×4): qty 1

## 2018-12-11 MED ORDER — STROKE: EARLY STAGES OF RECOVERY BOOK: Freq: Once | Status: DC

## 2018-12-11 MED ORDER — PANTOPRAZOLE SODIUM 40 MG PO TBEC
40.0000 mg | DELAYED_RELEASE_TABLET | Freq: Every day | ORAL | Status: DC
  Administered 2018-12-11 – 2018-12-13 (×3): 40 mg via ORAL
  Filled 2018-12-11 (×3): qty 1

## 2018-12-11 MED ORDER — PANTOPRAZOLE SODIUM 40 MG IV SOLR: 40.0000 mg | Freq: Every day | INTRAVENOUS | Status: DC

## 2018-12-11 MED ORDER — ACETAMINOPHEN 160 MG/5ML PO SOLN: 650.0000 mg | ORAL | Status: DC | PRN

## 2018-12-11 MED ORDER — ACETAMINOPHEN 650 MG RE SUPP: 650.0000 mg | RECTAL | Status: DC | PRN

## 2018-12-11 MED ORDER — NICARDIPINE HCL IN NACL 20-0.86 MG/200ML-% IV SOLN
0.0000 mg/h | INTRAVENOUS | Status: DC
  Administered 2018-12-11: 5 mg/h via INTRAVENOUS
  Administered 2018-12-11: 2.5 mg/h via INTRAVENOUS
  Filled 2018-12-11 (×2): qty 200

## 2018-12-11 MED ORDER — CLEVIDIPINE BUTYRATE 0.5 MG/ML IV EMUL: 0.0000 mg/h | INTRAVENOUS | Status: DC

## 2018-12-11 MED ORDER — SENNOSIDES-DOCUSATE SODIUM 8.6-50 MG PO TABS
1.0000 | ORAL_TABLET | Freq: Two times a day (BID) | ORAL | Status: DC
  Administered 2018-12-11 – 2018-12-13 (×3): 1 via ORAL
  Filled 2018-12-11 (×3): qty 1

## 2018-12-11 MED ORDER — LABETALOL HCL 5 MG/ML IV SOLN: 20.0000 mg | Freq: Once | INTRAVENOUS | Status: DC

## 2018-12-11 MED ORDER — ACETAMINOPHEN 325 MG PO TABS
650.0000 mg | ORAL_TABLET | ORAL | Status: DC | PRN
  Administered 2018-12-11 – 2018-12-13 (×3): 650 mg via ORAL
  Filled 2018-12-11 (×3): qty 2

## 2018-12-11 MED ORDER — ATORVASTATIN CALCIUM 10 MG PO TABS
10.0000 mg | ORAL_TABLET | Freq: Every day | ORAL | Status: DC
  Administered 2018-12-11: 22:00:00 10 mg via ORAL
  Filled 2018-12-11: qty 1

## 2018-12-11 NOTE — ED Notes (Signed)
ED TO INPATIENT HANDOFF REPORT  ED Nurse Name and Phone #: Vilinda Blanks RN [phone]  S Name/Age/Gender Danny Frederick 85 y.o. male Room/Bed: 019C/019C  Code Status   Code Status: Full Code  Home/SNF/Other Home Patient oriented to: self, place, time and situation Is this baseline? Yes   Triage Complete: Triage complete  Chief Complaint Right Sided Weakness  Triage Note Per [facility], pt from home endorses difficulty with gait that began upon waking this morning, LSN last night 2200. Pt had fall 3 weeks ago and hit his head, has had 3 episodes of dizziness since then. Pt was seen here then for the fall. Right leg appeared to be dragging for EMS and right hand numbness. Not on thinners. Hypertensive 220/100 HR 70, CBG 105, spo2 98% on RA. Ax0x4. Independent at baseline.   Allergies No Known Allergies  Level of Care/Admitting Diagnosis ED Disposition    ED Disposition Condition [facility] Area: [place] [[id number]]  Level of Care: ICU [6]  Diagnosis: ICH (intracerebral hemorrhage) [facility]) [[id number]]  Admitting Physician: Radford Pax [[id number]]  Attending Physician: Radford Pax [[id number]]  Estimated length of stay: 3 - 4 days  Certification:: I certify this patient will need inpatient services for at least 2 midnights  PT Class (Do Not Modify): Inpatient [101]  PT Acc Code (Do Not Modify): Private [1]       B Medical/Surgery History Past Medical History:  Diagnosis Date  . Allergic conjunctivitis   . Allergic rhinitis, cause unspecified    on allergy shots (Dr. Orvil Feil)  . Atherosclerosis of aorta (HCC)    noted on CT angio of abd/pelvis, in many vessels  . BPH (benign prostatic hypertrophy)   . Diverticulosis of colon 12/09  . Foot drop, right   . GERD (gastroesophageal reflux disease)   . Hearing loss in left ear    both ears now  . History of Clostridium difficile    multiple times 2016--s/p fecal transplant (Dr. Baxter Flattery).   Marland Kitchen Hx of echocardiogram    Echo (03/2014):  Mild LVH, EF 50-55%, no RWMA, Gr 1 DD, mild MR, mild reduced RVSF  . Hypertension   . Internal hemorrhoids   . Iron deficiency anemia, unspecified 6/09  . Mitral valve prolapse    h/o; normal echo 12/2011 with no MVP seen  . 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 Right ventricular dysfunction 01/23/2014   Secondary to obstruction of main pulmonary artery  . Ruptured thoracic aortic aneurysm ([place]) 01/23/2014  . S/P ascending aortic aneurysm repair 01/24/2014  . Unspecified hypothyroidism    Past Surgical History:  Procedure Laterality Date  . CERVICAL LAMINECTOMY  1972   C5-6  . COLONOSCOPY WITH PROPOFOL N/A 11/09/2014   Procedure: COLONOSCOPY WITH PROPOFOL;  Surgeon: Jerene Bears, MD;  Location: [facility];  Service: Gastroenterology;  Laterality: N/A;  . ESOPHAGOGASTRODUODENOSCOPY  10/31/08   normal; Dr. Edison Nasuti  . FECAL TRANSPLANT N/A 11/09/2014   Procedure: FECAL TRANSPLANT;  Surgeon: Jerene Bears, MD;  Location: [facility];  Service: Gastroenterology;  Laterality: N/A;  . HEMORRHOIDECTOMY WITH HEMORRHOID BANDING  04/07/13  . hemorroidal banding  04/07/2013   x3-Dr.Eric Redmond Pulling  . PROSTATE SURGERY  2007   photovaporization  . ROTATOR CUFF REPAIR  10/2008   left; Dr. Onnie Graham  . SPINE SURGERY  2006   L4-5 disk surgery  . SPINE SURGERY  04/2013   L4-5, L5-S1 fusion.  Dr. Christella Noa  . THORACIC AORTIC ANEURYSM REPAIR  N/A 01/23/2014   Procedure: THORACIC ASCENDING ANEURYSM REPAIR (AAA);  Surgeon: Rexene Alberts, MD;  Location: [place];  Service: Open Heart Surgery;  Laterality: N/A;  . TONSILLECTOMY       A IV Location/Drains/Wounds Patient Lines/Drains/Airways Status   Active Line/Drains/Airways    Name:   Placement date:   Placement time:   Site:   Days:   Peripheral IV 12/11/18 Left Antecubital   12/11/18    1032    Antecubital   less than 1   AIRWAYS   11/09/14    1345     1493          Intake/Output Last 24 hours No intake or  output data in the 24 hours ending 12/11/18 1201  Labs/Imaging Results for orders placed or performed during the hospital encounter of 12/11/18 (from the past 48 hour(s))  Protime-INR     Status: None   Collection Time: 12/11/18 10:53 AM  Result Value Ref Range   Prothrombin Time 13.9 11.4 - 15.2 seconds   INR 1.1 0.8 - 1.2    Comment: (NOTE) INR goal varies based on device and disease states. Performed at [facility], [place] [address], [place], [place] [address]   APTT     Status: None   Collection Time: 12/11/18 10:53 AM  Result Value Ref Range   aPTT 28 24 - 36 seconds    Comment: Performed at [facility] [address] [place], [place] [address]  Comprehensive metabolic panel     Status: None   Collection Time: 12/11/18 10:53 AM  Result Value Ref Range   Sodium 137 135 - 145 mmol/L   Potassium 4.6 3.5 - 5.1 mmol/L   Chloride 99 98 - 111 mmol/L   CO2 25 22 - 32 mmol/L   Glucose, Bld 94 70 - 99 mg/dL   BUN 11 8 - 23 mg/dL   Creatinine, Ser 0.99 0.61 - 1.24 mg/dL   Calcium 9.4 8.9 - 10.3 mg/dL   Total Protein 7.8 6.5 - 8.1 g/dL   Albumin 3.7 3.5 - 5.0 g/dL   AST 24 15 - 41 U/L   ALT 14 0 - 44 U/L   Alkaline Phosphatase 68 38 - 126 U/L   Total Bilirubin 1.1 0.3 - 1.2 mg/dL   GFR calc non Af Amer >60 >60 mL/min   GFR calc Af Amer >60 >60 mL/min   Anion gap 13 5 - 15    Comment: Performed at [facility] [address] [place],  [address]  CBC WITH DIFFERENTIAL     Status: None   Collection Time: 12/11/18 10:53 AM  Result Value Ref Range   WBC 8.4 4.0 - 10.5 K/uL   RBC 5.33 4.22 - 5.81 MIL/uL   Hemoglobin 14.7 13.0 - 17.0 g/dL   HCT 46.2 39.0 - 52.0 %   MCV 86.7 80.0 - 100.0 fL   MCH 27.6 26.0 - 34.0 pg   MCHC 31.8 30.0 - 36.0 g/dL   RDW 13.7 11.5 - 15.5 %   Platelets 303 150 - 400 K/uL   nRBC 0.0 0.0 - 0.2 %   Neutrophils Relative % 65 %   Neutro Abs 5.5 1.7 - 7.7 K/uL   Lymphocytes Relative 23 %   Lymphs Abs 2.0 0.7 - 4.0 K/uL    Monocytes Relative 8 %   Monocytes Absolute 0.7 0.1 - 1.0 K/uL   Eosinophils Relative 3 %   Eosinophils Absolute 0.2  0.0 - 0.5 K/uL   Basophils Relative 1 %   Basophils Absolute 0.0 0.0 - 0.1 K/uL   Immature Granulocytes 0 %   Abs Immature Granulocytes 0.03 0.00 - 0.07 K/uL    Comment: Performed at [facility], [place] [address], [place], [place] [address]  Troponin I - ONCE - STAT     Status: None   Collection Time: 12/11/18 10:53 AM  Result Value Ref Range   Troponin I <0.03 <0.03 ng/mL    Comment: Performed at [facility] [address] [place], [place] [address]  I-Stat beta hCG blood, ED ([facility], [facility], [facility] only)     Status: None   Collection Time: 12/11/18 10:58 AM  Result Value Ref Range   I-stat hCG, quantitative <5.0 <5 mIU/mL   Comment 3            Comment:   GEST. AGE      CONC.  (mIU/mL)   <=1 WEEK        5 - 50     2 WEEKS       50 - 500     3 WEEKS       100 - 10,000     4 WEEKS     1,000 - 30,000        MALE AND NON-PREGNANT MALE:     LESS THAN 5 mIU/mL    Ct Head Wo Contrast  Result Date: 12/11/2018 CLINICAL DATA:  Acute onset of RIGHT UPPER extremity numbness that began this morning. Current history of hypertension. Personal history of ascending aortic aneurysm repair in 2015. EXAM: CT HEAD WITHOUT CONTRAST TECHNIQUE: Contiguous axial images were obtained from the base of the skull through the vertex without intravenous contrast. COMPARISON:  11/24/2018 and earlier. FINDINGS: Brain: Approximately 1.4 x 1.2 cm acute bleed involving the POSTERIOR LEFT basal ganglia. No associated midline shift. No acute hemorrhage or hematoma elsewhere. Moderate age related cortical and deep atrophy, unchanged dating back to 67. Severe changes of small vessel disease of the white matter diffusely, progressive since 2013. No extra-axial fluid collections. Vascular: Severe BILATERAL carotid siphon and severe RIGHT vertebral artery atherosclerosis. No hyperdense vessel. Skull: No  skull fracture or other focal osseous abnormality involving the skull. Sinuses/Orbits: Visualized paranasal sinuses, bilateral mastoid air cells and bilateral middle ear cavities well-aerated. Benign scleral calcifications involving the LEFT globe. Normal-appearing RIGHT globe. Other: None. IMPRESSION: 1. Acute hemorrhagic stroke/hypertensive hemorrhage involving the POSTERIOR LEFT basal ganglia. No associated midline shift. 2. Moderate age related generalized atrophy and severe chronic microvascular ischemic changes of the white matter. I personally telephoned these critical/emergent results to Dr. Regenia Skeeter of the emergency department at the time of interpretation on 12/11/2018 11:35 a.m. Electronically Signed   By: Evangeline Dakin M.D.   On: 12/11/2018 11:37    Pending Labs Unresulted Labs (From admission, onward)    Start     Ordered   12/11/18 1049  Ethanol  ONCE - STAT,   STAT     12/11/18 1049   12/11/18 1049  Urine rapid drug screen (hosp performed)  ONCE - STAT,   STAT     12/11/18 1049   12/11/18 1049  Urinalysis, Routine w reflex microscopic  ONCE - STAT,   STAT     12/11/18 1049          Vitals/Pain Today's Vitals   12/11/18 1137 12/11/18 1138 12/11/18 1140 12/11/18 1142  BP:  136/77 (!) 145/70 127/66  Pulse:  66 64 74  Resp:  13 18 (!) 22  Temp:      TempSrc:      SpO2:  96% 96% 95%  Weight:      Height:      PainSc: 0-No pain       Isolation Precautions No active isolations  Medications Medications  nicardipine (CARDENE) 20mg  in 0.86% saline 240ml IV infusion (0.1 mg/ml) (7.5 mg/hr Intravenous Rate/Dose Change 12/11/18 1144)   stroke: mapping our early stages of recovery book (has no administration in time range)  acetaminophen (TYLENOL) tablet 650 mg (has no administration in time range)    Or  acetaminophen (TYLENOL) solution 650 mg (has no administration in time range)    Or  acetaminophen (TYLENOL) suppository 650 mg (has no administration in time range)   senna-docusate (Senokot-S) tablet 1 tablet (has no administration in time range)  pantoprazole (PROTONIX) injection 40 mg (has no administration in time range)  labetalol (NORMODYNE,TRANDATE) injection 20 mg (has no administration in time range)    Mobility walks with person assist High fall risk   Focused Assessments Neuro Assessment Handoff:  Swallow screen pass? Yes  Cardiac Rhythm: Normal sinus rhythm NIH Stroke Scale ( + Modified Stroke Scale Criteria)  Interval: Initial Level of Consciousness (1a.)   : Alert, keenly responsive LOC Questions (1b. )   +: Answers both questions correctly LOC Commands (1c. )   + : Performs both tasks correctly Best Gaze (2. )  +: Normal Visual (3. )  +: No visual loss Facial Palsy (4. )    : Normal symmetrical movements Motor Arm, Left (5a. )   +: No drift Motor Arm, Right (5b. )   +: Drift Motor Leg, Left (6a. )   +: Drift Motor Leg, Right (6b. )   +: No drift Limb Ataxia (7. ): Present in one limb Sensory (8. )   +: Normal, no sensory loss Best Language (9. )   +: No aphasia Dysarthria (10. ): Normal Extinction/Inattention (11.)   +: No Abnormality Modified SS Total  +: 2 Complete NIHSS TOTAL: 4 Last date known well: 12/10/18 Last time known well: 2200 Neuro Assessment: Exceptions to WDL Neuro Checks:   Initial (12/11/18 1049)  Last Documented NIHSS Modified Score: 2 (12/11/18 1138) Has TPA been given? No If patient is a Neuro Trauma and patient is going to OR before floor call report to [place] nurse: [phone] or [phone]     R Recommendations: See Admitting Provider Note  Report given to:   Additional Notes:  Pt from home, reportedly woke up this am with new onset right leg weakness. LKN 2300 last night before bed. CT head shows small bleed. On Cardene at 7.5 mg/hr, initial SBP 190s, now 120s. Pt pain free, A/O x 4, slight ataxia and weakness to right leg and arm. Neuro ahs seen at bedside.

## 2018-12-11 NOTE — ED Triage Notes (Signed)
Per [facility], pt from home endorses difficulty with gait that began upon waking this morning, LSN last night 2200. Pt had fall 3 weeks ago and hit his head, has had 3 episodes of dizziness since then. Pt was seen here then for the fall. Right leg appeared to be dragging for EMS and right hand numbness. Not on thinners. Hypertensive 220/100 HR 70, CBG 105, spo2 98% on RA. Ax0x4. Independent at baseline.

## 2018-12-11 NOTE — ED Provider Notes (Signed)
[facility]ER[facility] DEPARTMENT Provider Note   CSN: [id number] Arrival date & time: 12/11/18  1038    History   Chief Complaint Chief Complaint  Patient presents with  . Gait Problem    HPI Danny Frederick is a 85 y.o. male.     HPI  85 year old male presents with right-sided weakness and trouble walking.  Went to bed last night at 10 PM feeling fine.  Woke up this morning around 6:30 or 7 AM and had to go to the bathroom.  He noticed when he was walking he was having trouble walking but could not exactly say why.  When he went to wipe he noticed his right hand and arm were numb.  EMS noted his blood pressure to be 220/100.  He is on blood pressure medicine but did not take his medicine today.  He has had some issues with gait instability on and off over the last several weeks but nothing like today.  He is never had arm symptoms.  No trouble speaking or blurry vision.  When EMS had the patient stand up and walk his right leg seemed to be dragging.  He also notes he is felt short of breath for about 3 weeks.  Is not that bad and he has not had a cough, fever, or chest pain.  Past Medical History:  Diagnosis Date  . Allergic conjunctivitis   . Allergic rhinitis, cause unspecified    on allergy shots (Dr. Orvil Feil)  . Atherosclerosis of aorta (HCC)    noted on CT angio of abd/pelvis, in many vessels  . BPH (benign prostatic hypertrophy)   . Diverticulosis of colon 12/09  . Foot drop, right   . GERD (gastroesophageal reflux disease)   . Hearing loss in left ear    both ears now  . History of Clostridium difficile    multiple times 2016--s/p fecal transplant (Dr. Baxter Flattery).  Marland Kitchen Hx of echocardiogram    Echo (03/2014):  Mild LVH, EF 50-55%, no RWMA, Gr 1 DD, mild MR, mild reduced RVSF  . Hypertension   . Internal hemorrhoids   . Iron deficiency anemia, unspecified 6/09  . Mitral valve prolapse    h/o; normal echo 12/2011 with no MVP seen  . 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 Right ventricular dysfunction 01/23/2014   Secondary to obstruction of main pulmonary artery  . Ruptured thoracic aortic aneurysm ([place]) 01/23/2014  . S/P ascending aortic aneurysm repair 01/24/2014  . Unspecified hypothyroidism     Patient Active Problem List   Diagnosis Date Noted  . Lip laceration 12/07/2018  . Impaired fasting glucose 12/06/2018  . Spinal stenosis of lumbar region 09/08/2017  . Atherosclerosis of aorta ([place]) 11/08/2015  . Recurrent Clostridium difficile diarrhea   . Bacteremia   . S/P ascending aortic aneurysm repair 01/24/2014  . Ruptured thoracic aortic aneurysm ([place]) 01/23/2014  . Pulmonary artery thrombosis ([place]) 01/23/2014  . Right ventricular dysfunction 01/23/2014  . Solitary pulmonary nodule 12/08/2013  . Chest pain 12/02/2013  . Allergic conjunctivitis 10/20/2012  . Syncope 01/22/2012  . Hypothyroidism 01/22/2011  . Essential hypertension, benign 01/22/2011  . GERD (gastroesophageal reflux disease) 01/22/2011  . Iron deficiency anemia 01/22/2011  . IRON DEFICIENCY ANEMIA SECONDARY TO BLOOD LOSS 11/08/2008  . ANEMIA 05/26/2008  . Essential hypertension 03/02/2008  . DYSPNEA 03/02/2008    Past Surgical History:  Procedure Laterality Date  . CERVICAL LAMINECTOMY  1972   C5-6  . COLONOSCOPY WITH PROPOFOL N/A 11/09/2014  Procedure: COLONOSCOPY WITH PROPOFOL;  Surgeon: Jerene Bears, MD;  Location: [facility];  Service: Gastroenterology;  Laterality: N/A;  . ESOPHAGOGASTRODUODENOSCOPY  10/31/08   normal; Dr. Edison Nasuti  . FECAL TRANSPLANT N/A 11/09/2014   Procedure: FECAL TRANSPLANT;  Surgeon: Jerene Bears, MD;  Location: [facility];  Service: Gastroenterology;  Laterality: N/A;  . HEMORRHOIDECTOMY WITH HEMORRHOID BANDING  04/07/13  . hemorroidal banding  04/07/2013   x3-Dr.Eric Redmond Pulling  . PROSTATE SURGERY  2007   photovaporization  . ROTATOR CUFF REPAIR  10/2008   left; Dr. Onnie Graham  . SPINE SURGERY  2006   L4-5 disk surgery   . SPINE SURGERY  04/2013   L4-5, L5-S1 fusion.  Dr. Christella Noa  . THORACIC AORTIC ANEURYSM REPAIR N/A 01/23/2014   Procedure: THORACIC ASCENDING ANEURYSM REPAIR (AAA);  Surgeon: Rexene Alberts, MD;  Location: [facility];  Service: Open Heart Surgery;  Laterality: N/A;  . TONSILLECTOMY          Home Medications    Prior to Admission medications   Medication Sig Start Date End Date Taking? Authorizing Provider  aspirin 81 MG tablet Take 81 mg by mouth at bedtime.     [provider]  atorvastatin (LIPITOR) 10 MG tablet TAKE 1 TABLET BY MOUTH  DAILY 11/17/18   Rita Ohara, MD  Cholecalciferol (VITAMIN D3) 2000 units TABS Take 1 tablet by mouth daily.    [provider]  citalopram (CELEXA) 20 MG tablet Take 1 tablet (20 mg total) by mouth daily. 10/08/18   Rita Ohara, MD  Ferrous Sulfate (IRON) 325 (65 FE) MG TABS Take 1 tablet by mouth every other day. Reported on 11/08/2015    [provider]  ipratropium (ATROVENT) 0.03 % nasal spray USE 2 SPRAYS IN EACH NOSTRIL TWICE A DAY AS NEEDED 10/11/18   [provider]  irbesartan (AVAPRO) 300 MG tablet Take 1 tablet (300 mg total) by mouth daily. 06/28/18   Lelon Perla, MD  metoprolol tartrate (LOPRESSOR) 25 MG tablet Take 1 tablet (25 mg total) by mouth 2 (two) times daily. 06/28/18   Lelon Perla, MD  omeprazole (PRILOSEC) 20 MG capsule TAKE 1 CAPSULE BY MOUTH  EVERY OTHER DAY 11/08/18   Rita Ohara, MD  salicylic acid-lactic acid 17 % external solution Apply to left foot wart once daily 11/19/18   Marzetta Board, DPM  SYNTHROID 50 MCG tablet TAKE 1 TABLET BY MOUTH  DAILY BEFORE BREAKFAST 11/16/18   Rita Ohara, MD  triamcinolone cream (KENALOG) 0.1 % Apply 1 application topically 2 (two) times daily. 10/16/16   Rita Ohara, MD    Family History Family History  Problem Relation Age of Onset  . Diabetes Mother   . Heart disease Mother        CHF  . Hypertension Mother   . Depression Mother   . Stroke Father    . Parkinsonism Father   . Hypertension Father   . Cancer Brother        bladder cancer  . Cancer Brother        prostate cancer  . Emphysema Brother   . Diabetes Sister   . Colon cancer Neg Hx     Social History Social History   Tobacco Use  . Smoking status: Former Smoker    Types: Cigarettes    Last attempt to quit: 09/01/1977    Years since quitting: 41.3  . Smokeless tobacco: Never Used  Substance Use Topics  . Alcohol use: Yes  Comment: 2 glasses wine per day  . Drug use: No     Allergies   Patient has no known allergies.   Review of Systems Review of Systems  Eyes: Negative for visual disturbance.  Respiratory: Positive for shortness of breath. Negative for cough.   Cardiovascular: Negative for chest pain.  Musculoskeletal: Positive for gait problem. Negative for neck pain.  Neurological: Positive for weakness and numbness. Negative for dizziness, speech difficulty and headaches.  All other systems reviewed and are negative.    Physical Exam Updated Vital Signs Pulse 64   Resp 14   Ht 5\' 4"  (1.626 m)   Wt 62.1 kg   SpO2 96%   BMI 23.50 kg/m   Physical Exam Vitals signs and nursing note reviewed.  Constitutional:      General: He is not in acute distress.    Appearance: He is well-developed. He is not ill-appearing or diaphoretic.  HENT:     Head: Normocephalic and atraumatic.     Right Ear: External ear normal.     Left Ear: External ear normal.     Nose: Nose normal.  Eyes:     General:        Right eye: No discharge.        Left eye: No discharge.     Extraocular Movements: Extraocular movements intact.     Pupils: Pupils are equal, round, and reactive to light.  Neck:     Musculoskeletal: Neck supple.  Cardiovascular:     Rate and Rhythm: Normal rate and regular rhythm.     Pulses:          Radial pulses are 2+ on the right side and 2+ on the left side.       Dorsalis pedis pulses are 2+ on the right side and 2+ on the left side.      Heart sounds: Normal heart sounds.  Pulmonary:     Effort: Pulmonary effort is normal.     Breath sounds: Normal breath sounds.  Abdominal:     Palpations: Abdomen is soft.     Tenderness: There is no abdominal tenderness.  Skin:    General: Skin is warm and dry.  Neurological:     Mental Status: He is alert and oriented to person, place, and time.     Comments: CN 3-12 grossly intact. 5/5 strength in LUE, LLE and RUE. However slightly weaker in RUE and with some mild drift. RLE 4/5. Subjectively sensation to right leg and arm. Difficulty with bilateral finger to nose  Psychiatric:        Mood and Affect: Mood is not anxious.      ED Treatments / Results  Labs (all labs ordered are listed, but only abnormal results are displayed) Labs Reviewed  PROTIME-INR  APTT  COMPREHENSIVE METABOLIC PANEL  CBC WITH DIFFERENTIAL/PLATELET  TROPONIN I  ETHANOL  RAPID URINE DRUG SCREEN, HOSP PERFORMED  URINALYSIS, ROUTINE W REFLEX MICROSCOPIC  I-STAT CREATININE, ED  I-STAT BETA HCG BLOOD, ED ([facility], [facility], [facility] ONLY)    EKG EKG Interpretation  Date/Time:  Saturday December 11 2018 11:01:56 EDT Ventricular Rate:  58 PR Interval:    QRS Duration: 91 QT Interval:  429 QTC Calculation: 422 R Axis:   -90 Text Interpretation:  Sinus rhythm Probable left atrial enlargement Left anterior fascicular block Anterior infarct, old T wave changes V5-6 seems new since May 2015 Confirmed by Sherwood Gambler [phone]) on 12/11/2018 11:16:11 AM   Radiology Ct Head Wo Contrast  Result Date: 12/11/2018 CLINICAL DATA:  Acute onset of RIGHT UPPER extremity numbness that began this morning. Current history of hypertension. Personal history of ascending aortic aneurysm repair in 2015. EXAM: CT HEAD WITHOUT CONTRAST TECHNIQUE: Contiguous axial images were obtained from the base of the skull through the vertex without intravenous contrast. COMPARISON:  11/24/2018 and earlier. FINDINGS: Brain: Approximately 1.4 x 1.2 cm  acute bleed involving the POSTERIOR LEFT basal ganglia. No associated midline shift. No acute hemorrhage or hematoma elsewhere. Moderate age related cortical and deep atrophy, unchanged dating back to 27. Severe changes of small vessel disease of the white matter diffusely, progressive since 2013. No extra-axial fluid collections. Vascular: Severe BILATERAL carotid siphon and severe RIGHT vertebral artery atherosclerosis. No hyperdense vessel. Skull: No skull fracture or other focal osseous abnormality involving the skull. Sinuses/Orbits: Visualized paranasal sinuses, bilateral mastoid air cells and bilateral middle ear cavities well-aerated. Benign scleral calcifications involving the LEFT globe. Normal-appearing RIGHT globe. Other: None. IMPRESSION: 1. Acute hemorrhagic stroke/hypertensive hemorrhage involving the POSTERIOR LEFT basal ganglia. No associated midline shift. 2. Moderate age related generalized atrophy and severe chronic microvascular ischemic changes of the white matter. I personally telephoned these critical/emergent results to Dr. Regenia Skeeter of the emergency department at the time of interpretation on 12/11/2018 11:35 a.m. Electronically Signed   By: Evangeline Dakin M.D.   On: 12/11/2018 11:37    Procedures .Critical Care Performed by: Sherwood Gambler, MD Authorized by: Sherwood Gambler, MD   Critical care provider statement:    Critical care time (minutes):  35   Critical care time was exclusive of:  Separately billable procedures and treating other patients   Critical care was necessary to treat or prevent imminent or life-threatening deterioration of the following conditions:  CNS failure or compromise   Critical care was time spent personally by me on the following activities:  Discussions with consultants, evaluation of patient's response to treatment, examination of patient, ordering and performing treatments and interventions, ordering and review of laboratory studies, ordering  and review of radiographic studies, pulse oximetry, re-evaluation of patient's condition, obtaining history from patient or surrogate and review of old charts   (including critical care time)  Medications Ordered in ED Medications  nicardipine (CARDENE) 20mg  in 0.86% saline 264ml IV infusion (0.1 mg/ml) (7.5 mg/hr Intravenous Rate/Dose Change 12/11/18 1144)   stroke: mapping our early stages of recovery book (has no administration in time range)  acetaminophen (TYLENOL) tablet 650 mg (has no administration in time range)    Or  acetaminophen (TYLENOL) solution 650 mg (has no administration in time range)    Or  acetaminophen (TYLENOL) suppository 650 mg (has no administration in time range)  senna-docusate (Senokot-S) tablet 1 tablet (has no administration in time range)  pantoprazole (PROTONIX) injection 40 mg (has no administration in time range)  labetalol (NORMODYNE,TRANDATE) injection 20 mg (has no administration in time range)     Initial Impression / Assessment and Plan / ED Course  I have reviewed the triage vital signs and the nursing notes.  Pertinent labs & imaging results that were available during my care of the patient were reviewed by me and considered in my medical decision making (see chart for details).  Clinical Course as of Dec 10 1149  Sat Dec 11, 2018  1049 Patient's last known well was 10 PM last night.  Thus he is not a candidate for a code stroke and currently does not meet any large vessel occlusion criteria.   [SG]  1118 Looking  at CT scan, there is an intraparenchymal hemorrhage.  His blood pressure is not as high as it was with EMS, he will be started on nicardipine.  Neurology consulted.   [SG]  1123 D/w Dr. Lorraine Lax. Starting nicardepine. He asks for code stroke to be called for resources/staff.   [SG]    Clinical Course User Index [SG] Sherwood Gambler, MD       Patient has had good blood pressure control with nicardipine.  Besides the mild neuro  symptoms he appears well.  Protecting airway.  Will be admitted to the neurology service in the ICU.  Final Clinical Impressions(s) / ED Diagnoses   Final diagnoses:  Basal ganglia hemorrhage [facility])  Hypertensive emergency    ED Discharge Orders    None       Sherwood Gambler, MD 12/11/18 1152

## 2018-12-11 NOTE — Progress Notes (Signed)
Pt states "please make sure that I am a DNR". He said we had a copy of his advanced directives "on file" here. His daughter Ivin Booty states that, yes, pt has an advanced directive to this effect. She can fax or scan upon request. MD Aroor notified and DNR order received.

## 2018-12-11 NOTE — Progress Notes (Signed)
Pt arrived about 1230, one BP measurement was taken then lost when we admitted him onto monitor, next BP taken at 1243.

## 2018-12-11 NOTE — H&P (Addendum)
NEURO HOSPITALIST  H&P    Chief Complaint: unsteady gait and numbness of right side  History obtained from:  Patient / chart  HPI:                                                                                                                                         Danny Frederick is an 85 y.o. male  With PMH HTN, ascending aortic aneurysm repair ( 2015), pulmonary artery thrombosis who presented to [facility] ED with c/o of unsteady gait. Code stroke activated in ED.    Patient states that he woke up this morning around  6:30 or 7 am and noted some trouble walking while going to the bathroom.  Noticed numbness and over his right hand.  Symptoms persisted and patient called EMS.  Last known normal was 10 PM last night and hence he was not stroke alerted on arrival.  CT head was performed and showed a small left basal ganglia hemorrhage.  The pressure was 220/ 100 mmHg on arrival.    Patient did have a fall 3 weeks ago and hit his head and has had 3 episodes of dizziness since then. Also has had SOB for about 3 weeks.  Not on any anticoagulation. No prior stroke history.     Date last known well: 12/10/18  Time last knownno, hemorrhage     Modified Rankin: Rankin Score=1 ICH score: 1     Past Medical History:  Diagnosis Date  . Allergic conjunctivitis   . Allergic rhinitis, cause unspecified    on allergy shots (Dr. Orvil Feil)  . Atherosclerosis of aorta (HCC)    noted on CT angio of abd/pelvis, in many vessels  . BPH (benign prostatic hypertrophy)   . Diverticulosis of colon 12/09  . Foot drop, right   . GERD (gastroesophageal reflux disease)   . Hearing loss in left ear    both ears now  . History of Clostridium difficile    multiple times 2016--s/p fecal transplant (Dr. Baxter Flattery).  Marland Kitchen Hx of echocardiogram    Echo (03/2014):  Mild LVH, EF 50-55%, no RWMA, Gr 1 DD, mild MR, mild reduced RVSF  . Hypertension   . Internal hemorrhoids   .  Iron deficiency anemia, unspecified 6/09  . Mitral valve prolapse    h/o; normal echo 12/2011 with no MVP seen  . 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 Right ventricular dysfunction 01/23/2014   Secondary to obstruction of main pulmonary artery  . Ruptured thoracic aortic aneurysm ([place]) 01/23/2014  . S/P ascending aortic aneurysm repair 01/24/2014  . Unspecified hypothyroidism     Past Surgical History:  Procedure Laterality Date  . CERVICAL LAMINECTOMY  1972   C5-6  . COLONOSCOPY WITH PROPOFOL N/A 11/09/2014   Procedure: COLONOSCOPY WITH PROPOFOL;  Surgeon: Jerene Bears, MD;  Location: [facility];  Service: Gastroenterology;  Laterality: N/A;  . ESOPHAGOGASTRODUODENOSCOPY  10/31/08   normal; Dr. Edison Nasuti  . FECAL TRANSPLANT N/A 11/09/2014   Procedure: FECAL TRANSPLANT;  Surgeon: Jerene Bears, MD;  Location: [facility];  Service: Gastroenterology;  Laterality: N/A;  . HEMORRHOIDECTOMY WITH HEMORRHOID BANDING  04/07/13  . hemorroidal banding  04/07/2013   x3-Dr.Eric Redmond Pulling  . PROSTATE SURGERY  2007   photovaporization  . ROTATOR CUFF REPAIR  10/2008   left; Dr. Onnie Graham  . SPINE SURGERY  2006   L4-5 disk surgery  . SPINE SURGERY  04/2013   L4-5, L5-S1 fusion.  Dr. Christella Noa  . THORACIC AORTIC ANEURYSM REPAIR N/A 01/23/2014   Procedure: THORACIC ASCENDING ANEURYSM REPAIR (AAA);  Surgeon: Rexene Alberts, MD;  Location: [facility];  Service: Open Heart Surgery;  Laterality: N/A;  . TONSILLECTOMY      Family History  Problem Relation Age of Onset  . Diabetes Mother   . Heart disease Mother        CHF  . Hypertension Mother   . Depression Mother   . Stroke Father   . Parkinsonism Father   . Hypertension Father   . Cancer Brother        bladder cancer  . Cancer Brother        prostate cancer  . Emphysema Brother   . Diabetes Sister   . Colon cancer Neg Hx        Social History:  reports that he quit smoking about 41 years ago. His smoking use included cigarettes. He has never  used smokeless tobacco. He reports current alcohol use. He reports that he does not use drugs.  Allergies: No Known Allergies  Medications:                                                                                                                           Current Facility-Administered Medications  Medication Dose Route Frequency Provider Last Rate Last Dose  . nicardipine (CARDENE) 20mg  in 0.86% saline 226ml IV infusion (0.1 mg/ml)  0-15 mg/hr Intravenous Continuous Sherwood Gambler, MD       Current Outpatient Medications  Medication Sig Dispense Refill  . aspirin 81 MG tablet Take 81 mg by mouth at bedtime.     Marland Kitchen atorvastatin (LIPITOR) 10 MG tablet TAKE 1 TABLET BY MOUTH  DAILY 90 tablet 0  . Cholecalciferol (VITAMIN D3) 2000 units TABS Take 1 tablet by mouth daily.    . citalopram (CELEXA) 20 MG tablet Take 1 tablet (20 mg total) by mouth daily. 90 tablet 1  . Ferrous Sulfate (IRON) 325 (65 FE) MG TABS Take 1 tablet by mouth every other day. Reported on 11/08/2015    . ipratropium (ATROVENT) 0.03 % nasal spray USE 2 SPRAYS  IN EACH NOSTRIL TWICE A DAY AS NEEDED    . irbesartan (AVAPRO) 300 MG tablet Take 1 tablet (300 mg total) by mouth daily. 90 tablet 3  . metoprolol tartrate (LOPRESSOR) 25 MG tablet Take 1 tablet (25 mg total) by mouth 2 (two) times daily. 180 tablet 3  . omeprazole (PRILOSEC) 20 MG capsule TAKE 1 CAPSULE BY MOUTH  EVERY OTHER DAY 45 capsule 0  . salicylic acid-lactic acid 17 % external solution Apply to left foot wart once daily 14 mL 0  . SYNTHROID 50 MCG tablet TAKE 1 TABLET BY MOUTH  DAILY BEFORE BREAKFAST 90 tablet 0  . triamcinolone cream (KENALOG) 0.1 % Apply 1 application topically 2 (two) times daily. 454 g 0     ROS:                                                                                                                                       ROS was performed and is negative except as noted in HPI    General Examination:      Per Dr. Lorraine Lax                                                                                                 Blood pressure (!) 173/132, pulse 62, temperature 98.4 F (36.9 C), temperature source Oral, resp. rate 18, height 5\' 4"  (1.626 m), weight 62.1 kg, SpO2 97 %.    Neurological Examination Mental Status: Alert, oriented, thought content appropriate.  Speech fluent without evidence of aphasia.  Able to follow 3 step commands without difficulty. Cranial Nerves: II: visual fields grossly normal, pupils equal, round, reactive to light and accommodation III,IV, VI: ptosis not present, extraocular muscles extra-ocular motions intact bilaterally V,VII: smile symmetric, facial light touch sensation normal bilaterally VIII: hearing normal bilaterally IX,X: gag reflex present XI: trapezius strength/neck flexion strength normal bilaterally XII: tongue strength normal  Motor: Right upper extremity has about 4 x 5 strength.  5.5 strength over right lower extremity.  5 out of 5 strength over left upper and left lower extremity. Tone and bulk:normal tone throughout; no atrophy noted Sensory: Sensation to light touch her right upper and lower extremity. Deep Tendon Reflexes: 2+ reflexes over both patella and biceps Plantars: Right: downgoing   Left: downgoing Cerebellar: normal finger-to-nose, normal rapid alternating movements and normal heel-to-shin test normal gait and station  Lab Results: Basic Metabolic Panel: Recent Labs  Lab 12/06/18 1013  NA 137  K 4.6  CL 96  CO2  27  GLUCOSE 93  BUN 14  CREATININE 1.00  CALCIUM 9.7    CBC: Recent Labs  Lab 12/11/18 1053  WBC 8.4  NEUTROABS 5.5  HGB 14.7  HCT 46.2  MCV 86.7  PLT 303    Lipid Panel: Recent Labs  Lab 12/06/18 1013  CHOL 148  TRIG 61  HDL 69  CHOLHDL 2.1  LDLCALC 67    Imaging: Ct Head Wo Contrast  Result Date: 12/11/2018 CLINICAL DATA:  Acute onset of RIGHT UPPER extremity numbness that began this morning. Current  history of hypertension. Personal history of ascending aortic aneurysm repair in 2015. EXAM: CT HEAD WITHOUT CONTRAST TECHNIQUE: Contiguous axial images were obtained from the base of the skull through the vertex without intravenous contrast. COMPARISON:  11/24/2018 and earlier. FINDINGS: Brain: Approximately 1.4 x 1.2 cm acute bleed involving the POSTERIOR LEFT basal ganglia. No associated midline shift. No acute hemorrhage or hematoma elsewhere. Moderate age related cortical and deep atrophy, unchanged dating back to 31. Severe changes of small vessel disease of the white matter diffusely, progressive since 2013. No extra-axial fluid collections. Vascular: Severe BILATERAL carotid siphon and severe RIGHT vertebral artery atherosclerosis. No hyperdense vessel. Skull: No skull fracture or other focal osseous abnormality involving the skull. Sinuses/Orbits: Visualized paranasal sinuses, bilateral mastoid air cells and bilateral middle ear cavities well-aerated. Benign scleral calcifications involving the LEFT globe. Normal-appearing RIGHT globe. Other: None. IMPRESSION: 1. Acute hemorrhagic stroke/hypertensive hemorrhage involving the POSTERIOR LEFT basal ganglia. No associated midline shift. 2. Moderate age related generalized atrophy and severe chronic microvascular ischemic changes of the white matter. I personally telephoned these critical/emergent results to Dr. Regenia Skeeter of the emergency department at the time of interpretation on 12/11/2018 11:35 a.m. Electronically Signed   By: Evangeline Dakin M.D.   On: 12/11/2018 11:37       Laurey Morale, MSN, NP-C [facility] [phone]  12/11/2018, 11:28 AM   Attending physician note to follow with Assessment and plan .   Assessment: 85 y.o. male   With PMH HTN, ascending aortic aneurysm repair ( 2015), pulmonary artery thrombosis who presented to [facility] ED with c/o of unsteady gait. Code stroke activated in ED. CTH: left BG hemorrhage. Admit  to ICU.  Stroke Risk Factors - hypertension  Left basal ganglia hemorrhage  ICH score 1 for age Etiology: HTN  Plan Admit to neuro ICU No antiplatelet/AC Repeat CT head if he worsens PT/OT Swallow evaluation AIC and Lipid profile Hold statin   CNS -Close neuro monitoring  RESP No acute issues monitor  CV Hypertensive Emergency -Aggressive BP control, goal SBP  <140  -Labetalol and cleviprex  GI/GU -Gentle hydration   HEME -Monitor -transfuse for hgb < 7   ENDO -goal HgbA1c < 7  ID Possible Aspiration PNA -CXR -NPO -Monitor  Prophylaxis DVT:  SCD's GI: doc/ senna   Dispo:TBD  Diet: NPO until cleared by speech or bedside swallow eval  Code Status: DNR     This patient is neurologically critically ill due to left BG hemorrhage.  He is at risk for significant risk of neurological worsening from cerebral edema,  death from brain herniation, heart failure, hemorrhagic conversion, infection, respiratory failure and seizure. This patient's care requires constant monitoring of vital signs, hemodynamics, respiratory and cardiac monitoring, review of multiple databases, neurological assessment, discussion with family, other specialists and medical decision making of high complexity.  I spent 50 minutes of neurocritical time in the care of this patient.

## 2018-12-11 NOTE — ED Notes (Signed)
Activated code stroke with kim from [place] per Dr. Regenia Skeeter /Dr. aroor

## 2018-12-11 NOTE — Progress Notes (Signed)
PT Cancellation Note  Patient Details Name: Danny Frederick MRN: [id number] DOB: Jan 28, 1934   Cancelled Treatment:    Reason Eval/Treat Not Completed: Active bedrest order Thanks,  Wells Guiles B. [name], PT, DPT  Acute Rehabilitation [phone] pager [phone]) [phone] office    Wells Guiles B Kris No 12/11/2018, 12:52 PM

## 2018-12-12 ENCOUNTER — Inpatient Hospital Stay (HOSPITAL_COMMUNITY): Payer: Medicare Other

## 2018-12-12 DIAGNOSIS — I951 Orthostatic hypotension: Secondary | ICD-10-CM

## 2018-12-12 DIAGNOSIS — R55 Syncope and collapse: Secondary | ICD-10-CM

## 2018-12-12 DIAGNOSIS — I639 Cerebral infarction, unspecified: Secondary | ICD-10-CM

## 2018-12-12 LAB — CBC
HCT: 43.3 % (ref 39.0–52.0)
Hemoglobin: 14.4 g/dL (ref 13.0–17.0)
MCH: 28.2 pg (ref 26.0–34.0)
MCHC: 33.3 g/dL (ref 30.0–36.0)
MCV: 84.7 fL (ref 80.0–100.0)
Platelets: 306 10*3/uL (ref 150–400)
RBC: 5.11 MIL/uL (ref 4.22–5.81)
RDW: 13.5 % (ref 11.5–15.5)
WBC: 7.8 10*3/uL (ref 4.0–10.5)
nRBC: 0 % (ref 0.0–0.2)

## 2018-12-12 LAB — LIPID PANEL
Cholesterol: 152 mg/dL (ref 0–200)
HDL: 70 mg/dL (ref >40)
LDL Cholesterol: 69 mg/dL (ref 0–99)
Total CHOL/HDL Ratio: 2.2 RATIO
Triglycerides: 67 mg/dL (ref <150)
VLDL: 13 mg/dL (ref 0–40)

## 2018-12-12 LAB — BASIC METABOLIC PANEL
Anion gap: 14 (ref 5–15)
BUN: 9 mg/dL (ref 8–23)
CO2: 25 mmol/L (ref 22–32)
Calcium: 9.6 mg/dL (ref 8.9–10.3)
Chloride: 97 mmol/L — ABNORMAL LOW (ref 98–111)
Creatinine, Ser: 0.9 mg/dL (ref 0.61–1.24)
GFR calc Af Amer: >60 mL/min (ref >60)
GFR calc non Af Amer: >60 mL/min (ref >60)
Glucose, Bld: 82 mg/dL (ref 70–99)
Potassium: 3.9 mmol/L (ref 3.5–5.1)
Sodium: 136 mmol/L (ref 135–145)

## 2018-12-12 LAB — ECHOCARDIOGRAM COMPLETE
Height: 64 in
Weight: 2190.49 [oz_av]

## 2018-12-12 MED ORDER — LABETALOL HCL 5 MG/ML IV SOLN
5.0000 mg | INTRAVENOUS | Status: DC | PRN
  Administered 2018-12-13 (×2): 10 mg via INTRAVENOUS
  Filled 2018-12-12 (×2): qty 4

## 2018-12-12 MED ORDER — CITALOPRAM HYDROBROMIDE 10 MG PO TABS
20.0000 mg | ORAL_TABLET | Freq: Every day | ORAL | Status: DC
  Administered 2018-12-12 – 2018-12-13 (×2): 20 mg via ORAL
  Filled 2018-12-12 (×2): qty 2

## 2018-12-12 MED ORDER — IRBESARTAN 300 MG PO TABS
300.0000 mg | ORAL_TABLET | Freq: Every day | ORAL | Status: DC
  Administered 2018-12-12 – 2018-12-13 (×2): 300 mg via ORAL
  Filled 2018-12-12: qty 2
  Filled 2018-12-12: qty 1

## 2018-12-12 MED ORDER — VITAMIN D 25 MCG (1000 UNIT) PO TABS
2000.0000 [IU] | ORAL_TABLET | Freq: Every day | ORAL | Status: DC
  Administered 2018-12-12 – 2018-12-13 (×2): 2000 [IU] via ORAL
  Filled 2018-12-12 (×2): qty 2

## 2018-12-12 MED ORDER — FERROUS SULFATE 325 (65 FE) MG PO TABS
325.0000 mg | ORAL_TABLET | ORAL | Status: DC
  Administered 2018-12-12: 325 mg via ORAL
  Filled 2018-12-12: qty 1

## 2018-12-12 MED ORDER — ATORVASTATIN CALCIUM 10 MG PO TABS
10.0000 mg | ORAL_TABLET | Freq: Every day | ORAL | Status: DC
  Administered 2018-12-12: 10 mg via ORAL
  Filled 2018-12-12: qty 1

## 2018-12-12 NOTE — Evaluation (Signed)
Physical Therapy Evaluation Patient Details Name: Danny Frederick MRN: [id number] DOB: 04-02-1934 Today's Date: 12/12/2018   History of Present Illness  Pt is a 85 y/o male  presented with c/o unsteady gait.  CT reveals small L basal ganglia hemorrhage. Noted reports a fall 3 weeks ago and has had 3 episodes of dizziness since then.  PMH: HTN, ascending aortic aneurysm repair, pulmonary artery thrombosis, cervical laminectomy, L4-5, L5-S1 fusion.   Clinical Impression   Pt admitted with above diagnosis. Pt currently with functional limitations due to the deficits listed below (see PT Problem List). Independent and active prior to admission, enjoys the gym; notable for a fall approx 3 weeks age -- suspicious for orthostasis; Presents to therapies with decr coordination, R sided weakness, decr awareness of deficits and safety; Strongly recommend CIR for post-acute rehab to maximize independence and safety with mobility prior to dc home;  Pt will benefit from skilled PT to increase their independence and safety with mobility to allow discharge to the venue listed below.       Follow Up Recommendations CIR    Equipment Recommendations  Rolling walker with 5" wheels;3in1 (PT)    Recommendations for Other Services       Precautions / Restrictions Precautions Precautions: Fall Precaution Comments: SBP drop at 3 minutes standing Restrictions Weight Bearing Restrictions: No      Mobility  Bed Mobility Overal bed mobility: Needs Assistance Bed Mobility: Supine to Sit     Supine to sit: Min guard     General bed mobility comments: min guard for safety towards R side   Transfers Overall transfer level: Needs assistance Equipment used: 2 person hand held assist Transfers: Sit to/from Stand Sit to Stand: Mod assist;+2 safety/equipment         General transfer comment: Mod assist to steady while powering up; Heavy mod assist for balance with pivotal steps bed to recliner on  patient's R side  Ambulation/Gait Ambulation/Gait assistance: Mod assist;+2 safety/equipment Gait Distance (Feet): (pivotal steps bed to chair) Assistive device: 2 person hand held assist       General Gait Details: Took a few steps bed to chair; resembling festination  Stairs            Wheelchair Mobility    Modified Rankin (Stroke Patients Only)       Balance Overall balance assessment: Needs assistance Sitting-balance support: No upper extremity supported;Feet supported Sitting balance-Leahy Scale: Fair     Standing balance support: Single extremity supported;During functional activity Standing balance-Leahy Scale: Poor Standing balance comment: reliant on at least 1 UE support                             Pertinent Vitals/Pain Pain Assessment: No/denies pain    Home Living Family/patient expects to be discharged to:: Private residence Living Arrangements: Spouse/significant other Available Help at Discharge: Family;Available 24 hours/day Type of Home: House Home Access: Stairs to enter Entrance Stairs-Rails: None Entrance Stairs-Number of Steps: 1 Home Layout: Two level;Bed/bath upstairs Home Equipment: Walker - 2 wheels;Cane - single point      Prior Function Level of Independence: Independent         Comments: independent, active (enjoys going to the gym)      Hand Dominance   Dominant Hand: Right    Extremity/Trunk Assessment   Upper Extremity Assessment Upper Extremity Assessment: Defer to OT evaluation RUE Deficits / Details: dysmetric, grossly 3+/5 MMT (limited shoulder ROM- reports  baseline), poor proprioception RUE Sensation: decreased light touch;decreased proprioception RUE Coordination: decreased fine motor;decreased gross motor    Lower Extremity Assessment Lower Extremity Assessment: RLE deficits/detail;LLE deficits/detail RLE Deficits / Details: Hip flexion 3+/5, quad 4+/5; decr coordination with alternating toe  tapping RLE Coordination: decreased gross motor LLE Deficits / Details: 5/5 throughout  LLE Coordination: decreased gross motor       Communication   Communication: No difficulties  Cognition Arousal/Alertness: Awake/alert Behavior During Therapy: WFL for tasks assessed/performed Overall Cognitive Status: Impaired/Different from baseline Area of Impairment: Attention;Memory;Following commands;Safety/judgement;Awareness;Problem solving                   Current Attention Level: Sustained Memory: Decreased short-term memory Following Commands: Follows one step commands consistently;Follows one step commands with increased time Safety/Judgement: Decreased awareness of deficits Awareness: Emergent Problem Solving: Slow processing;Difficulty sequencing;Requires verbal cues General Comments: pt oriented, poor awareness to deficits and need for assistance       General Comments General comments (skin integrity, edema, etc.):   12/12/18 0934  Vital Signs  Patient Position (if appropriate) Orthostatic Vitals  Orthostatic Lying   BP- Lying 126/72  Pulse- Lying 67  Orthostatic Sitting  BP- Sitting 134/77  Pulse- Sitting 74  Orthostatic Standing at 0 minutes  BP- Standing at 0 minutes (!) 122/106 (difficulty relaxing arm)  Pulse- Standing at 0 minutes 84  Orthostatic Standing at 3 minutes  BP- Standing at 3 minutes 109/74  Pulse- Standing at 3 minutes 86   Positive for SBP drop at 3 minutes standing; Difficulty obtaining correct BPs as he had a hard time relaxing his RUE; employed deep breathing relaxation techniques    Exercises     Assessment/Plan    PT Assessment Patient needs continued PT services  PT Problem List Decreased strength;Decreased range of motion;Decreased activity tolerance;Decreased balance;Decreased mobility;Decreased coordination;Decreased cognition;Decreased knowledge of use of DME;Decreased safety awareness;Decreased knowledge of  precautions;Impaired tone       PT Treatment Interventions DME instruction;Gait training;Stair training;Functional mobility training;Therapeutic activities;Therapeutic exercise;Balance training;Patient/family education;Cognitive remediation    PT Goals (Current goals can be found in the Care Plan section)  Acute Rehab PT Goals Patient Stated Goal: to get home to his wife PT Goal Formulation: With patient Time For Goal Achievement: 12/26/18 Potential to Achieve Goals: Good    Frequency Min 4X/week   Barriers to discharge        Co-evaluation PT/OT/SLP Co-Evaluation/Treatment: Yes Reason for Co-Treatment: To address functional/ADL transfers;For patient/therapist safety PT goals addressed during session: Mobility/safety with mobility OT goals addressed during session: ADL's and self-care       AM-PAC PT "6 Clicks" Mobility  Outcome Measure Help needed turning from your back to your side while in a flat bed without using bedrails?: A Little Help needed moving from lying on your back to sitting on the side of a flat bed without using bedrails?: A Little Help needed moving to and from a bed to a chair (including a wheelchair)?: A Lot Help needed standing up from a chair using your arms (e.g., wheelchair or bedside chair)?: A Lot Help needed to walk in hospital room?: A Lot Help needed climbing 3-5 steps with a railing? : A Lot 6 Click Score: 14    End of Session Equipment Utilized During Treatment: Gait belt Activity Tolerance: Patient tolerated treatment well Patient left: in chair;with call bell/phone within reach;with chair alarm set Nurse Communication: Mobility status PT Visit Diagnosis: Unsteadiness on feet (R26.81);Other abnormalities of gait and mobility (R26.89);Hemiplegia and  hemiparesis Hemiplegia - Right/Left: Right Hemiplegia - dominant/non-dominant: Dominant Hemiplegia - caused by: Cerebral infarction    Time: 0931-1004 PT Time Calculation (min) (ACUTE ONLY):  33 min   Charges:   PT Evaluation $PT Eval Moderate Complexity: 1 Mod          Roney Marion, [place]  Acute Rehabilitation Services Pager [phone] Office [phone]   Colletta [place] 12/12/2018, 10:56 AM

## 2018-12-12 NOTE — Progress Notes (Signed)
Report called to [facility] on 3w. Pt transferred to 3w 30. Assessment stable.

## 2018-12-12 NOTE — Progress Notes (Signed)
PT Cancellation Note  Patient Details Name: Danny Frederick MRN: [id number] DOB: June 01, 1934   Cancelled Treatment:    Reason Eval/Treat Not Completed: Active bedrest order   Will follow along,   Roney Marion, PT  Acute Rehabilitation Services Pager [phone] Office [phone]    Colletta Maryland 12/12/2018, 7:55 AM

## 2018-12-12 NOTE — Progress Notes (Signed)
OT Cancellation Note  Patient Details Name: Danny Frederick MRN: [id number] DOB: [birth year]   Cancelled Treatment:    Reason Eval/Treat Not Completed: Active bedrest order, will follow and initiate OT eval when able and appropriate.   Delight Stare, OT Acute Rehabilitation Services Pager [phone] Office [phone]    Delight Stare 12/12/2018, 7:18 AM

## 2018-12-12 NOTE — Progress Notes (Signed)
  Echocardiogram 2D Echocardiogram has been performed.  Bobbye Charleston 12/12/2018, 9:09 AM

## 2018-12-12 NOTE — Progress Notes (Signed)
Rehab Admissions Coordinator Note:  Per PT recommendation, this patient was screened by Jhonnie Garner for appropriateness for an Inpatient Acute Rehab Consult.  At this time, we are recommending an Inpatient Rehab consult. AC will contact MD to request an IP Rehab Consult Order.   Jhonnie Garner 12/12/2018, 11:05 AM  I can be reached at [phone].

## 2018-12-12 NOTE — Progress Notes (Signed)
STROKE TEAM PROGRESS NOTE   SUBJECTIVE (INTERVAL HISTORY) His RN is at the bedside.  Pt recounted HPI with me.  Patient states that for the last 3 weeks he had dizziness spells several times, dizziness described as unsteadiness after walking for will be longer, feeling unsteady with gait, 1 week ago, he fell face down after unsteadiness.  He said he may lost consciousness for short period time.  He cannot remember BP at that time with EMS.  He still has some stitches at his right upper lip.  Yesterday, he had left arm numbness tingling on presentation.  CT found left basal ganglia small bleeding.  He stated that he had fluctuation blood pressures with most time high blood pressure.   OBJECTIVE Vitals:   12/12/18 0645 12/12/18 0700 12/12/18 0715 12/12/18 0800  BP: 132/72 132/78 (!) 126/105   Pulse: 62 64 62   Resp: 10 16 17    Temp:    97.9 F (36.6 C)  TempSrc:    Oral  SpO2: 99% 99% 100%   Weight:      Height:        CBC:  Recent Labs  Lab 12/11/18 1053 12/12/18 0432  WBC 8.4 7.8  NEUTROABS 5.5  --   HGB 14.7 14.4  HCT 46.2 43.3  MCV 86.7 84.7  PLT 303 628    Basic Metabolic Panel:  Recent Labs  Lab 12/11/18 1053 12/12/18 0432  NA 137 136  K 4.6 3.9  CL 99 97*  CO2 25 25  GLUCOSE 94 82  BUN 11 9  CREATININE 0.99 0.90  CALCIUM 9.4 9.6    Lipid Panel:     Component Value Date/Time   CHOL 152 12/12/2018 0432   CHOL 148 12/06/2018 1013   TRIG 67 12/12/2018 0432   HDL 70 12/12/2018 0432   HDL 69 12/06/2018 1013   CHOLHDL 2.2 12/12/2018 0432   VLDL 13 12/12/2018 0432   LDLCALC 69 12/12/2018 0432   LDLCALC 67 12/06/2018 1013   HgbA1c:  Lab Results  Component Value Date   HGBA1C 5.7 (A) 12/06/2018   Urine Drug Screen:     Component Value Date/Time   LABOPIA NONE DETECTED 12/11/2018 1148   COCAINSCRNUR NONE DETECTED 12/11/2018 1148   LABBENZ NONE DETECTED 12/11/2018 1148   AMPHETMU NONE DETECTED 12/11/2018 1148   THCU NONE DETECTED 12/11/2018 1148    LABBARB NONE DETECTED 12/11/2018 1148    Alcohol Level     Component Value Date/Time   ETH <10 12/11/2018 1053    IMAGING   Ct Head Wo Contrast 12/11/2018 IMPRESSION:  1. Unchanged left thalamic hemorrhage.  2. Extensive chronic small vessel ischemic disease.     Ct Head Wo Contrast 12/11/2018 IMPRESSION:  1. Acute hemorrhagic stroke/hypertensive hemorrhage involving the POSTERIOR LEFT basal ganglia. No associated midline shift.  2. Moderate age related generalized atrophy and severe chronic microvascular ischemic changes of the white matter.    Dg Chest Portable 1 View 12/11/2018 IMPRESSION:  1. No acute cardiopulmonary disease.  2. Stable mild cardiomegaly without pulmonary edema.  3. Stable fibrosis involving the lung bases, LEFT greater than RIGHT.    Transthoracic Echocardiogram   1. The left ventricle has normal systolic function with an ejection fraction of 60-65%. The cavity size was normal. Left ventricular diastolic Doppler parameters are indeterminate. There is abnormal septal motion likely secondary to post-operative  state.  2. The right ventricle has normal systolic function. The cavity was normal. There is no increase in right ventricular  wall thickness.  3. Right atrial size was mildly dilated.  4. The aortic root and ascending aorta are normal in size.  5. No intracardiac thrombi or masses were visualized.  6. There is redundancy of the interatrial septum. No atrial level shunt detected by color flow Doppler.   EKG - SR rate 64 BPM. Question previous MI? (See cardiology reading for complete details)    PHYSICAL EXAM  Temp:  [97.9 F (36.6 C)-98.6 F (37 C)] 97.9 F (36.6 C) (04/12 0800) Pulse Rate:  [48-94] 72 (04/12 0915) Resp:  [8-28] 16 (04/12 0915) BP: (106-196)/(40-132) 157/89 (04/12 0915) SpO2:  [89 %-100 %] 100 % (04/12 0915) Weight:  [62.1 kg] 62.1 kg (04/11 1044)  General - Well nourished, well developed, in no apparent  distress.  Ophthalmologic - fundi not visualized due to noncooperation.  Cardiovascular - Regular rate and rhythm.  Mental Status -  Level of arousal and orientation to time, place, and person were intact. Language including expression, naming, repetition, comprehension was assessed and found intact.  Cranial Nerves II - XII - II - Visual field intact OU. III, IV, VI - Extraocular movements intact. V - Facial sensation intact bilaterally. VII - slight right facial nasolabial fold flattening. VIII - Hearing & vestibular intact bilaterally. X - Palate elevates symmetrically. XI - Chin turning & shoulder shrug intact bilaterally. XII - Tongue protrusion intact.  Motor Strength - The patient's strength was normal in all extremities and pronator drift was absent except RUE distal 5-/5.  Bulk was normal and fasciculations were absent.   Motor Tone - Muscle tone was assessed at the neck and appendages and was normal.  Reflexes - The patient's reflexes were symmetrical in all extremities and he had no pathological reflexes.  Sensory - Light touch, temperature/pinprick were assessed and were symmetrical except right LE 95% of sensation comparing with left LE.    Coordination - The patient had right FTN and HTS ataxia out of proportion to the weakness.  Tremor was absent.  Gait and Station - deferred.   ASSESSMENT/PLAN [name] is a 85 y.o. male with history of HTN, ascending aortic aneurysm repair ( 2015), hypothyroidism, and pulmonary artery thrombosis presenting with an unsteady gait, Rt hand numbness, dizziness, elevated BP, SOB, and recent fall. He did not receive IV t-PA due to hemorrhage.  ICH - small left basal ganglia hemorrhage likely due to hypertension  Resultant  RUE and RLE ataxia  CT head - Acute hemorrhagic stroke/hypertensive hemorrhage involving the POSTERIOR LEFT basal ganglia  Repeat CT head stable ICH  2D Echo - EF 60-65%  LDL - 69  HgbA1c -  5.7  UDS  - negative  VTE prophylaxis - SCDs  Diet  - Heart healthy with thin liquids.  aspirin 81 mg daily prior to admission, now on No antithrombotic  Ongoing aggressive stroke risk factor management  Therapy recommendations:  pending  Disposition:  Pending  Hypertension ? Orthostatic hypotension  Stable . SBP goal < 160 mm Hg . Long-term BP goal normotensive . Off cardene . Resume home avapro and metoprolol . Orthostatic vital pending  Syncope   Recent hx of dizziness, unsteadiness with long walk - presyncope  Fall one week ago - consistent with syncope on description  Was told due to dehydration  Orthostatic pending  May consider adjust BP meds with PCP  Hyperlipidemia  Lipid lowering medication PTA:  Lipitor 10 mg daily  LDL 69, goal < 70  Current lipid lowering medication: lipitor  10  Continue statin at discharge  Other Stroke Risk Factors  Advanced age  Former cigarette smoker - quit 41 years ago  ETOH use, advised to drink no more than 1 alcoholic beverage per day.  Family hx stroke (father)  Hx of PE  Other Active Problems  Thoracic aortic aneurysm s/p repair  MVP   Hospital day # 1  This patient is critically ill due to [place], syncope, HTN and at significant risk of neurological worsening, death form hematoma expansion, seizure, hypertensive emergency, brain edema. This patient's care requires constant monitoring of vital signs, hemodynamics, respiratory and cardiac monitoring, review of multiple databases, neurological assessment, discussion with family, other specialists and medical decision making of high complexity. I spent 35 minutes of neurocritical care time in the care of this patient.  Rosalin Hawking, MD PhD Stroke Neurology 12/12/2018 11:11 AM   To contact Stroke Continuity provider, please refer to [email or web address] After hours, contact General Neurology

## 2018-12-12 NOTE — Evaluation (Signed)
Occupational Therapy Evaluation Patient Details Name: Danny Frederick MRN: [id number] DOB: 05-02-1934 Today's Date: 12/12/2018    History of Present Illness Pt is a 85 y/o male  presented with c/o unsteady gait.  CT reveals small L basal ganglia hemorrhage. Noted reports a fall 3 weeks ago and has had 3 episodes of dizziness since then.  PMH: HTN, ascending aortic aneurysm repair, pulmonary artery thrombosis, cervical laminectomy, L4-5, L5-S1 fusion.    Clinical Impression   PTA patient independent and driving.  Admitted for above and limited by problem list below, including dominant R UE weakness, proprioception, sensation and coordination, impaired balance, decreased awareness, R inattention, and decreased attention, sequencing, and problem solving.  Patient requires min guard for bed mobility, mod assist +2 safety for basic transfers, mod assist for UB ADLs, max-total assist for LB ADLs.  Patient will benefit from continued OT services while admitted and would best benefit from CIR in order to maximize independence with ADLs/mobility and return to PLOF.     Follow Up Recommendations  CIR    Equipment Recommendations  Other (comment)(TBD at next venue of care)    Recommendations for Other Services Rehab consult;Speech consult     Precautions / Restrictions Precautions Precautions: Fall Restrictions Weight Bearing Restrictions: No      Mobility Bed Mobility Overal bed mobility: Needs Assistance Bed Mobility: Supine to Sit     Supine to sit: Min guard     General bed mobility comments: min guard for safety towards R side   Transfers Overall transfer level: Needs assistance Equipment used: 2 person hand held assist Transfers: Sit to/from Stand Sit to Stand: Mod assist;+2 safety/equipment         General transfer comment: Mod assist to steady while powering up; Heavy mod assist for balance with pivotal steps bed to recliner on patient's R side    Balance Overall  balance assessment: Needs assistance Sitting-balance support: No upper extremity supported;Feet supported Sitting balance-Leahy Scale: Fair     Standing balance support: Single extremity supported;During functional activity Standing balance-Leahy Scale: Poor Standing balance comment: reliant on at least 1 UE support                           ADL either performed or assessed with clinical judgement   ADL Overall ADL's : Needs assistance/impaired     Grooming: Wash/dry hands;Wash/dry face;Minimal assistance;Sitting   Upper Body Bathing: Minimal assistance;Sitting   Lower Body Bathing: Moderate assistance;+2 for safety/equipment;Sit to/from stand   Upper Body Dressing : Moderate assistance;Sitting   Lower Body Dressing: Maximal assistance;+2 for physical assistance;+2 for safety/equipment;Sit to/from stand Lower Body Dressing Details (indicate cue type and reason): requires total assist to don socks, mod assist +2 sit<>stand Toilet Transfer: Moderate assistance;+2 for safety/equipment;Stand-pivot Toilet Transfer Details (indicate cue type and reason): simulated to recliner  Toileting- Clothing Manipulation and Hygiene: Total assistance;+2 for safety/equipment;Sit to/from stand       Functional mobility during ADLs: Moderate assistance;+2 for safety/equipment General ADL Comments: pt limited by impaired balance, R incoordination, and weakness      Vision   Additional Comments: further assessment required     Perception Perception Perception Tested?: Yes Perception Deficits: Inattention/neglect Inattention/Neglect: Does not attend to right side of body   Praxis      Pertinent Vitals/Pain Pain Assessment: No/denies pain     Hand Dominance Right   Extremity/Trunk Assessment Upper Extremity Assessment Upper Extremity Assessment: RUE deficits/detail RUE Deficits / Details: dysmetric,  grossly 3+/5 MMT (limited shoulder ROM- reports baseline), poor  proprioception RUE Sensation: decreased light touch;decreased proprioception RUE Coordination: decreased fine motor;decreased gross motor   Lower Extremity Assessment Lower Extremity Assessment: Defer to PT evaluation       Communication Communication Communication: No difficulties   Cognition Arousal/Alertness: Awake/alert Behavior During Therapy: WFL for tasks assessed/performed Overall Cognitive Status: Impaired/Different from baseline Area of Impairment: Attention;Memory;Following commands;Safety/judgement;Awareness;Problem solving                   Current Attention Level: Sustained Memory: Decreased short-term memory Following Commands: Follows one step commands consistently;Follows one step commands with increased time Safety/Judgement: Decreased awareness of deficits Awareness: Emergent Problem Solving: Slow processing;Difficulty sequencing;Requires verbal cues General Comments: pt oriented, poor awareness to deficits and need for assistance    General Comments  Vitals monitored throughout session, see flowsheets for orthostatic BPs    Exercises     Shoulder Instructions      Home Living Family/patient expects to be discharged to:: Private residence Living Arrangements: Spouse/significant other Available Help at Discharge: Family;Available 24 hours/day Type of Home: House Home Access: Stairs to enter [employer] of Steps: 1 Entrance Stairs-Rails: None Home Layout: Two level;Bed/bath upstairs Alternate Level Stairs-Number of Steps: 12, landing then 3 Alternate Level Stairs-Rails: Right;Left Bathroom Shower/Tub: Occupational psychologist: Standard     Home Equipment: Environmental consultant - 2 wheels;Cane - single point          Prior Functioning/Environment Level of Independence: Independent        Comments: independent, active (enjoys going to the gym)         OT Problem List: Decreased strength;Decreased range of motion;Decreased  activity tolerance;Impaired balance (sitting and/or standing);Impaired vision/perception;Decreased coordination;Decreased cognition;Decreased safety awareness;Decreased knowledge of use of DME or AE;Decreased knowledge of precautions;Impaired UE functional use;Impaired sensation      OT Treatment/Interventions: Self-care/ADL training;Neuromuscular education;DME and/or AE instruction;Therapeutic activities;Patient/family education;Balance training;Cognitive remediation/compensation;Visual/perceptual remediation/compensation    OT Goals(Current goals can be found in the care plan section) Acute Rehab OT Goals Patient Stated Goal: to get home  OT Goal Formulation: With patient Time For Goal Achievement: 12/26/18 Potential to Achieve Goals: Good  OT Frequency: Min 2X/week   Barriers to D/C:            Co-evaluation PT/OT/SLP Co-Evaluation/Treatment: Yes Reason for Co-Treatment: To address functional/ADL transfers;For patient/therapist safety   OT goals addressed during session: ADL's and self-care      AM-PAC OT "6 Clicks" Daily Activity     Outcome Measure Help from another person eating meals?: A Little Help from another person taking care of personal grooming?: A Little Help from another person toileting, which includes using toliet, bedpan, or urinal?: Total Help from another person bathing (including washing, rinsing, drying)?: A Lot Help from another person to put on and taking off regular upper body clothing?: A Lot Help from another person to put on and taking off regular lower body clothing?: Total 6 Click Score: 12   End of Session Equipment Utilized During Treatment: Gait belt Nurse Communication: Mobility status  Activity Tolerance: Patient tolerated treatment well Patient left: in chair;with call bell/phone within reach;with nursing/sitter in room  OT Visit Diagnosis: Other abnormalities of gait and mobility (R26.89);Ataxia, unspecified (R27.0);Other symptoms and  signs involving cognitive function                Time: 7510-2585 OT Time Calculation (min): 32 min Charges:  OT General Charges $OT Visit: 1 Visit OT Evaluation $  OT Eval Moderate Complexity: 1 Mod  Delight Stare, [place] [facility] Pager [phone] Office [phone]   Delight Stare 12/12/2018, 10:31 AM

## 2018-12-13 ENCOUNTER — Encounter (HOSPITAL_COMMUNITY): Payer: Self-pay

## 2018-12-13 ENCOUNTER — Inpatient Hospital Stay (HOSPITAL_COMMUNITY)
Admission: RE | Admit: 2018-12-13 | Discharge: 2018-12-28 | DRG: 057 | Disposition: A | Discharge status: Home Health | Payer: Medicare Other | Source: Intra-hospital | Attending: Physical Medicine & Rehabilitation | Admitting: Physical Medicine & Rehabilitation

## 2018-12-13 ENCOUNTER — Other Ambulatory Visit: Payer: Self-pay

## 2018-12-13 ENCOUNTER — Encounter: Payer: Self-pay | Admitting: *Deleted

## 2018-12-13 DIAGNOSIS — Z9181 History of falling: Secondary | ICD-10-CM

## 2018-12-13 DIAGNOSIS — Z8249 Family history of ischemic heart disease and other diseases of the circulatory system: Secondary | ICD-10-CM

## 2018-12-13 DIAGNOSIS — N400 Enlarged prostate without lower urinary tract symptoms: Secondary | ICD-10-CM | POA: Diagnosis present

## 2018-12-13 DIAGNOSIS — Z87891 Personal history of nicotine dependence: Secondary | ICD-10-CM

## 2018-12-13 DIAGNOSIS — E785 Hyperlipidemia, unspecified: Secondary | ICD-10-CM | POA: Diagnosis present

## 2018-12-13 DIAGNOSIS — Z825 Family history of asthma and other chronic lower respiratory diseases: Secondary | ICD-10-CM | POA: Diagnosis not present

## 2018-12-13 DIAGNOSIS — I69193 Ataxia following nontraumatic intracerebral hemorrhage: Principal | ICD-10-CM

## 2018-12-13 DIAGNOSIS — Z820 Family history of epilepsy and other diseases of the nervous system: Secondary | ICD-10-CM

## 2018-12-13 DIAGNOSIS — E039 Hypothyroidism, unspecified: Secondary | ICD-10-CM | POA: Diagnosis present

## 2018-12-13 DIAGNOSIS — G8191 Hemiplegia, unspecified affecting right dominant side: Secondary | ICD-10-CM | POA: Diagnosis not present

## 2018-12-13 DIAGNOSIS — F321 Major depressive disorder, single episode, moderate: Secondary | ICD-10-CM

## 2018-12-13 DIAGNOSIS — Z833 Family history of diabetes mellitus: Secondary | ICD-10-CM | POA: Diagnosis not present

## 2018-12-13 DIAGNOSIS — I619 Nontraumatic intracerebral hemorrhage, unspecified: Secondary | ICD-10-CM | POA: Diagnosis present

## 2018-12-13 DIAGNOSIS — I10 Essential (primary) hypertension: Secondary | ICD-10-CM

## 2018-12-13 DIAGNOSIS — I610 Nontraumatic intracerebral hemorrhage in hemisphere, subcortical: Secondary | ICD-10-CM

## 2018-12-13 DIAGNOSIS — Z823 Family history of stroke: Secondary | ICD-10-CM

## 2018-12-13 DIAGNOSIS — I69198 Other sequelae of nontraumatic intracerebral hemorrhage: Secondary | ICD-10-CM

## 2018-12-13 DIAGNOSIS — Z7982 Long term (current) use of aspirin: Secondary | ICD-10-CM

## 2018-12-13 DIAGNOSIS — K5900 Constipation, unspecified: Secondary | ICD-10-CM | POA: Diagnosis present

## 2018-12-13 DIAGNOSIS — Z86711 Personal history of pulmonary embolism: Secondary | ICD-10-CM | POA: Diagnosis not present

## 2018-12-13 DIAGNOSIS — K219 Gastro-esophageal reflux disease without esophagitis: Secondary | ICD-10-CM | POA: Diagnosis present

## 2018-12-13 DIAGNOSIS — R262 Difficulty in walking, not elsewhere classified: Secondary | ICD-10-CM | POA: Diagnosis present

## 2018-12-13 DIAGNOSIS — E782 Mixed hyperlipidemia: Secondary | ICD-10-CM | POA: Diagnosis present

## 2018-12-13 DIAGNOSIS — F39 Unspecified mood [affective] disorder: Secondary | ICD-10-CM | POA: Diagnosis present

## 2018-12-13 DIAGNOSIS — I69393 Ataxia following cerebral infarction: Secondary | ICD-10-CM | POA: Diagnosis not present

## 2018-12-13 LAB — HEMOGLOBIN A1C
Hgb A1c MFr Bld: 5.7 % — ABNORMAL HIGH (ref 4.8–5.6)
Mean Plasma Glucose: 117 mg/dL

## 2018-12-13 LAB — BASIC METABOLIC PANEL
Anion gap: 13 (ref 5–15)
BUN: 15 mg/dL (ref 8–23)
CO2: 22 mmol/L (ref 22–32)
Calcium: 9.4 mg/dL (ref 8.9–10.3)
Chloride: 99 mmol/L (ref 98–111)
Creatinine, Ser: 1.21 mg/dL (ref 0.61–1.24)
GFR calc Af Amer: >60 mL/min (ref >60)
GFR calc non Af Amer: 55 mL/min — ABNORMAL LOW (ref >60)
Glucose, Bld: 110 mg/dL — ABNORMAL HIGH (ref 70–99)
Potassium: 3.8 mmol/L (ref 3.5–5.1)
Sodium: 134 mmol/L — ABNORMAL LOW (ref 135–145)

## 2018-12-13 LAB — CBC
HCT: 42.3 % (ref 39.0–52.0)
Hemoglobin: 14.3 g/dL (ref 13.0–17.0)
MCH: 28.7 pg (ref 26.0–34.0)
MCHC: 33.8 g/dL (ref 30.0–36.0)
MCV: 84.8 fL (ref 80.0–100.0)
Platelets: 328 10*3/uL (ref 150–400)
RBC: 4.99 MIL/uL (ref 4.22–5.81)
RDW: 13.7 % (ref 11.5–15.5)
WBC: 10.9 10*3/uL — ABNORMAL HIGH (ref 4.0–10.5)
nRBC: 0 % (ref 0.0–0.2)

## 2018-12-13 MED ORDER — PANTOPRAZOLE SODIUM 40 MG PO TBEC
40.0000 mg | DELAYED_RELEASE_TABLET | Freq: Every day | ORAL | Status: DC
  Administered 2018-12-14 – 2018-12-28 (×15): 40 mg via ORAL
  Filled 2018-12-13 (×15): qty 1

## 2018-12-13 MED ORDER — VITAMIN D 25 MCG (1000 UNIT) PO TABS
2000.0000 [IU] | ORAL_TABLET | Freq: Every day | ORAL | Status: DC
  Administered 2018-12-14 – 2018-12-28 (×15): 2000 [IU] via ORAL
  Filled 2018-12-13 (×15): qty 2

## 2018-12-13 MED ORDER — ATORVASTATIN CALCIUM 10 MG PO TABS: 10.0000 mg | ORAL_TABLET | Freq: Every day | ORAL | Status: DC

## 2018-12-13 MED ORDER — ATORVASTATIN CALCIUM 10 MG PO TABS
10.0000 mg | ORAL_TABLET | Freq: Every day | ORAL | Status: DC
  Administered 2018-12-13 – 2018-12-27 (×15): 10 mg via ORAL
  Filled 2018-12-13 (×15): qty 1

## 2018-12-13 MED ORDER — CITALOPRAM HYDROBROMIDE 20 MG PO TABS
20.0000 mg | ORAL_TABLET | Freq: Every day | ORAL | Status: DC
  Administered 2018-12-14 – 2018-12-28 (×15): 20 mg via ORAL
  Filled 2018-12-13 (×15): qty 1

## 2018-12-13 MED ORDER — SENNOSIDES-DOCUSATE SODIUM 8.6-50 MG PO TABS
1.0000 | ORAL_TABLET | Freq: Two times a day (BID) | ORAL | Status: DC
  Administered 2018-12-13 – 2018-12-21 (×14): 1 via ORAL
  Filled 2018-12-13 (×16): qty 1

## 2018-12-13 MED ORDER — METOPROLOL TARTRATE 25 MG PO TABS
25.0000 mg | ORAL_TABLET | Freq: Two times a day (BID) | ORAL | Status: DC
  Administered 2018-12-13 – 2018-12-28 (×30): 25 mg via ORAL
  Filled 2018-12-13 (×30): qty 1

## 2018-12-13 MED ORDER — HYDRALAZINE HCL 20 MG/ML IJ SOLN
10.0000 mg | Freq: Once | INTRAMUSCULAR | Status: AC
  Administered 2018-12-13: 10 mg via INTRAVENOUS
  Filled 2018-12-13: qty 1

## 2018-12-13 MED ORDER — ACETAMINOPHEN 325 MG PO TABS
650.0000 mg | ORAL_TABLET | ORAL | Status: DC | PRN
  Administered 2018-12-15 – 2018-12-18 (×2): 650 mg via ORAL
  Filled 2018-12-13 (×3): qty 2

## 2018-12-13 MED ORDER — FERROUS SULFATE 325 (65 FE) MG PO TABS
325.0000 mg | ORAL_TABLET | ORAL | Status: DC
  Administered 2018-12-14 – 2018-12-28 (×8): 325 mg via ORAL
  Filled 2018-12-13 (×15): qty 1

## 2018-12-13 MED ORDER — SORBITOL 70 % SOLN: 30.0000 mL | Freq: Every day | Status: DC | PRN

## 2018-12-13 MED ORDER — SENNOSIDES-DOCUSATE SODIUM 8.6-50 MG PO TABS: 1.0000 | ORAL_TABLET | Freq: Two times a day (BID) | ORAL | Status: DC

## 2018-12-13 MED ORDER — IRBESARTAN 300 MG PO TABS
300.0000 mg | ORAL_TABLET | Freq: Every day | ORAL | Status: DC
  Administered 2018-12-14 – 2018-12-28 (×15): 300 mg via ORAL
  Filled 2018-12-13 (×15): qty 1

## 2018-12-13 MED ORDER — LEVOTHYROXINE SODIUM 50 MCG PO TABS
50.0000 ug | ORAL_TABLET | Freq: Every day | ORAL | Status: DC
  Administered 2018-12-14 – 2018-12-28 (×15): 50 ug via ORAL
  Filled 2018-12-13 (×15): qty 1

## 2018-12-13 NOTE — H&P (Signed)
Physical Medicine and Rehabilitation Admission H&P        Chief Complaint  Patient presents with  . Gait Problem  chief complaint:dizziness   HPI: Danny Frederick is an 85 year old right-handed male with history of hypertension, ascending aortic aneurysm repair 2015, hypertension, BPH. Per chart review lives with spouse. Independent prior to admission and active. 2 level home with bedroom and bathroom upstairs.Presented 12/11/2018 with unsteady gait,dizziness and numbness of right side.Reports of a fall 3 weeks ago. Blood pressure 220/100 on arrival.cranial CT scan showed acute hemorrhagic stroke hypertensive involving the posterior left basal ganglia no associated midline shift. Troponin negative. Echocardiogram with ejection fraction of 46% normal systolic function. Neurology follow-up [place] felt most likely related to hypertensive episode.Tolerating a regular diet. Therapy evaluations completed with recommendations of physical medicine rehabilitation consult. Patient was admitted for a compress of rehabilitation program.   Review of Systems  Constitutional: Negative for chills and fever.  HENT:       Hearing loss left ear  Eyes: Negative for blurred vision and double vision.  Respiratory: Negative for cough and shortness of breath.   Cardiovascular: Positive for leg swelling. Negative for chest pain and palpitations.  Gastrointestinal: Positive for constipation. Negative for heartburn, nausea and vomiting.       GERD  Genitourinary: Negative for dysuria, hematuria and urgency.  Musculoskeletal: Positive for joint pain and myalgias.       Recent fall 3 weeks ago  Skin: Negative for rash.  Neurological: Positive for dizziness, speech change, focal weakness and headaches.  All other systems reviewed and are negative.       Past Medical History:  Diagnosis Date  . Allergic conjunctivitis    . Allergic rhinitis, cause unspecified      on allergy shots (Dr. Orvil Feil)  .  Atherosclerosis of aorta (HCC)      noted on CT angio of abd/pelvis, in many vessels  . BPH (benign prostatic hypertrophy)    . Diverticulosis of colon 12/09  . Foot drop, right    . GERD (gastroesophageal reflux disease)    . Hearing loss in left ear      both ears now  . History of Clostridium difficile      multiple times 2016--s/p fecal transplant (Dr. Baxter Flattery).  Marland Kitchen Hx of echocardiogram      Echo (03/2014):  Mild LVH, EF 50-55%, no RWMA, Gr 1 DD, mild MR, mild reduced RVSF  . Hypertension    . Internal hemorrhoids    . Iron deficiency anemia, unspecified 6/09  . Mitral valve prolapse      h/o; normal echo 12/2011 with no MVP seen  . 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 Right ventricular dysfunction 01/23/2014    Secondary to obstruction of main pulmonary artery  . Ruptured thoracic aortic aneurysm ([facility]) 01/23/2014  . S/P ascending aortic aneurysm repair 01/24/2014  . Unspecified hypothyroidism           Past Surgical History:  Procedure Laterality Date  . CERVICAL LAMINECTOMY   1972    C5-6  . COLONOSCOPY WITH PROPOFOL N/A 11/09/2014    Procedure: COLONOSCOPY WITH PROPOFOL;  Surgeon: Jerene Bears, MD;  [facility];  Service: Gastroenterology;  Laterality: N/A;  . ESOPHAGOGASTRODUODENOSCOPY   10/31/08    normal; Dr. Edison Nasuti  . FECAL TRANSPLANT N/A 11/09/2014    Procedure: FECAL TRANSPLANT;  Surgeon: Jerene Bears, MD;  Location: [facility];  Service: Gastroenterology;  Laterality: N/A;  . HEMORRHOIDECTOMY WITH  HEMORRHOID BANDING   04/07/13  . hemorroidal banding   04/07/2013    x3-Dr.Eric Redmond Pulling  . PROSTATE SURGERY   2007    photovaporization  . ROTATOR CUFF REPAIR   10/2008    left; Dr. Onnie Graham  . SPINE SURGERY   2006    L4-5 disk surgery  . SPINE SURGERY   04/2013    L4-5, L5-S1 fusion.  Dr. Christella Noa  . THORACIC AORTIC ANEURYSM REPAIR N/A 01/23/2014    Procedure: THORACIC ASCENDING ANEURYSM REPAIR (AAA);  Surgeon: Rexene Alberts, MD;  Location: [facility];  Service:  Open Heart Surgery;  Laterality: N/A;  . TONSILLECTOMY             Family History  Problem Relation Age of Onset  . Diabetes Mother    . Heart disease Mother          CHF  . Hypertension Mother    . Depression Mother    . Stroke Father    . Parkinsonism Father    . Hypertension Father    . Cancer Brother          bladder cancer  . Cancer Brother          prostate cancer  . Emphysema Brother    . Diabetes Sister    . Colon cancer Neg Hx      Social History:  reports that he quit smoking about 41 years ago. His smoking use included cigarettes. He has never used smokeless tobacco. He reports current alcohol use. He reports that he does not use drugs. Allergies: No Known Allergies       Medications Prior to Admission  Medication Sig Dispense Refill  . aspirin EC 81 MG tablet Take 81 mg by mouth at bedtime.      Marland Kitchen atorvastatin (LIPITOR) 10 MG tablet TAKE 1 TABLET BY MOUTH  DAILY (Patient taking differently: Take 10 mg by mouth at bedtime. ) 90 tablet 0  . Cholecalciferol (VITAMIN D3) 2000 units TABS Take 2,000 Units by mouth daily.       . citalopram (CELEXA) 20 MG tablet Take 1 tablet (20 mg total) by mouth daily. 90 tablet 1  . Ferrous Sulfate (IRON) 325 (65 FE) MG TABS Take 325 mg by mouth every other day. Reported on 11/08/2015      . ipratropium (ATROVENT) 0.03 % nasal spray Place 2 sprays into both nostrils 2 (two) times daily as needed for rhinitis.       Marland Kitchen irbesartan (AVAPRO) 300 MG tablet Take 1 tablet (300 mg total) by mouth daily. 90 tablet 3  . metoprolol tartrate (LOPRESSOR) 25 MG tablet Take 1 tablet (25 mg total) by mouth 2 (two) times daily. 180 tablet 3  . omeprazole (PRILOSEC) 20 MG capsule TAKE 1 CAPSULE BY MOUTH  EVERY OTHER DAY (Patient taking differently: Take 20 mg by mouth every other day. ) 45 capsule 0  . OVER THE COUNTER MEDICATION Place 1 application into both eyes daily as needed (dry eyes/irritation). OTC eye gel      . SYNTHROID 50 MCG tablet TAKE 1 TABLET  BY MOUTH  DAILY BEFORE BREAKFAST (Patient taking differently: Take 50 mcg by mouth daily before breakfast. ) 90 tablet 0  . triamcinolone cream (KENALOG) 0.1 % Apply 1 application topically 2 (two) times daily. 638 g 0  . salicylic acid-lactic acid 17 % external solution Apply to left foot wart once daily (Patient not taking: Reported on 12/11/2018) 14 mL 0  Drug Regimen Review Drug regimen was reviewed and remains appropriate with no significant issues identified   Home: Home Living Family/patient expects to be discharged to:: Private residence Living Arrangements: Spouse/significant other Available Help at Discharge: Family, Available 24 hours/day Type of Home: House Home Access: Stairs to enter [employer] of Steps: 1 Entrance Stairs-Rails: None Home Layout: Two level, Bed/bath upstairs Alternate Level Stairs-Number of Steps: 12, landing then 3 Alternate Level Stairs-Rails: Right, Left Bathroom Shower/Tub: Multimedia programmer: Standard Home Equipment: Environmental consultant - 2 wheels, Cane - single point   Functional History: Prior Function Level of Independence: Independent Comments: independent, active (enjoys going to the gym)    Functional Status:  Mobility: Bed Mobility Overal bed mobility: Needs Assistance Bed Mobility: Supine to Sit Supine to sit: Min guard General bed mobility comments: min guard for safety towards R side  Transfers Overall transfer level: Needs assistance Equipment used: 2 person hand held assist Transfers: Sit to/from Stand Sit to Stand: Mod assist, +2 safety/equipment General transfer comment: Mod assist to steady while powering up; Heavy mod assist for balance with pivotal steps bed to recliner on patient's R side Ambulation/Gait Ambulation/Gait assistance: Mod assist, +2 safety/equipment Gait Distance (Feet): (pivotal steps bed to chair) Assistive device: 2 person hand held assist General Gait Details: Took a few steps bed to  chair; resembling festination   ADL: ADL Overall ADL's : Needs assistance/impaired Grooming: Wash/dry hands, Wash/dry face, Minimal assistance, Sitting Upper Body Bathing: Minimal assistance, Sitting Lower Body Bathing: Moderate assistance, +2 for safety/equipment, Sit to/from stand Upper Body Dressing : Moderate assistance, Sitting Lower Body Dressing: Maximal assistance, +2 for physical assistance, +2 for safety/equipment, Sit to/from stand Lower Body Dressing Details (indicate cue type and reason): requires total assist to don socks, mod assist +2 sit<>stand Toilet Transfer: Moderate assistance, +2 for safety/equipment, Stand-pivot Toilet Transfer Details (indicate cue type and reason): simulated to recliner  Toileting- Clothing Manipulation and Hygiene: Total assistance, +2 for safety/equipment, Sit to/from stand Functional mobility during ADLs: Moderate assistance, +2 for safety/equipment General ADL Comments: pt limited by impaired balance, R incoordination, and weakness    Cognition: Cognition Overall Cognitive Status: Impaired/Different from baseline Orientation Level: Oriented X4 Cognition Arousal/Alertness: Awake/alert Behavior During Therapy: WFL for tasks assessed/performed Overall Cognitive Status: Impaired/Different from baseline Area of Impairment: Attention, Memory, Following commands, Safety/judgement, Awareness, Problem solving Current Attention Level: Sustained Memory: Decreased short-term memory Following Commands: Follows one step commands consistently, Follows one step commands with increased time Safety/Judgement: Decreased awareness of deficits Awareness: Emergent Problem Solving: Slow processing, Difficulty sequencing, Requires verbal cues General Comments: pt oriented, poor awareness to deficits and need for assistance    Physical Exam: Blood pressure (!) 160/85, pulse 63, temperature 98 F (36.7 C), temperature source Oral, resp. rate 16, height 5\' 4"   (1.626 m), weight 62.1 kg, SpO2 99 %. Physical Exam  Constitutional: No distress.  HENT:  Head: Normocephalic and atraumatic.  Eyes: Pupils are equal, round, and reactive to light. EOM are normal.  Neck: Normal range of motion. No tracheal deviation present. No thyromegaly present.  Cardiovascular: Normal rate. Exam reveals no friction rub.  No murmur heard. Respiratory: Effort normal. No respiratory distress. He has no wheezes.  GI: Soft. He exhibits no distension. There is no abdominal tenderness.  Musculoskeletal: Normal range of motion.        General: No deformity or edema.  Neurological:  Alert sitting up in bed. Makes good eye contact with examiner. Follows commands. Provides his name and age.speech  sl dysarthric. Normal language reasonable insight and awareness. RUE 4/5 prox to distal. RLE 4/5 prox to distal. Mild right pronator drift.LUE and LLE 5/5. Mild decrease in LT RLE.   Skin: Skin is warm. He is not diaphoretic.  Psychiatric: He has a normal mood and affect. His behavior is normal.      Lab Results Last 48 Hours  Results for orders placed or performed during the hospital encounter of 12/11/18 (from the past 48 hour(s))  Ethanol     Status: None    Collection Time: 12/11/18 10:53 AM  Result Value Ref Range    Alcohol, Ethyl (B) <10 <10 mg/dL      Comment: (NOTE) Lowest detectable limit for serum alcohol is 10 mg/dL. For medical purposes only. Performed at [facility], [place] [address], [place], [place] [address]    Protime-INR     Status: None    Collection Time: 12/11/18 10:53 AM  Result Value Ref Range    Prothrombin Time 13.9 11.4 - 15.2 seconds    INR 1.1 0.8 - 1.2      Comment: (NOTE) INR goal varies based on device and disease states. Performed at [facility], [place] [address] [place], [place] [address]    APTT     Status: None    Collection Time: 12/11/18 10:53 AM  Result Value Ref Range    aPTT 28 24 - 36 seconds      Comment: Performed  at [facility] [address], [place], [place] [address]  Comprehensive metabolic panel     Status: None    Collection Time: 12/11/18 10:53 AM  Result Value Ref Range    Sodium 137 135 - 145 mmol/L    Potassium 4.6 3.5 - 5.1 mmol/L    Chloride 99 98 - 111 mmol/L    CO2 25 22 - 32 mmol/L    Glucose, Bld 94 70 - 99 mg/dL    BUN 11 8 - 23 mg/dL    Creatinine, Ser 0.99 0.61 - 1.24 mg/dL    Calcium 9.4 8.9 - 10.3 mg/dL    Total Protein 7.8 6.5 - 8.1 g/dL    Albumin 3.7 3.5 - 5.0 g/dL    AST 24 15 - 41 U/L    ALT 14 0 - 44 U/L    Alkaline Phosphatase 68 38 - 126 U/L    Total Bilirubin 1.1 0.3 - 1.2 mg/dL    GFR calc non Af Amer >60 >60 mL/min    GFR calc Af Amer >60 >60 mL/min    Anion gap 13 5 - 15      Comment: Performed at [facility] [address] [place], [place] [address]  CBC WITH DIFFERENTIAL     Status: None    Collection Time: 12/11/18 10:53 AM  Result Value Ref Range    WBC 8.4 4.0 - 10.5 K/uL    RBC 5.33 4.22 - 5.81 MIL/uL    Hemoglobin 14.7 13.0 - 17.0 g/dL    HCT 46.2 39.0 - 52.0 %    MCV 86.7 80.0 - 100.0 fL    MCH 27.6 26.0 - 34.0 pg    MCHC 31.8 30.0 - 36.0 g/dL    RDW 13.7 11.5 - 15.5 %    Platelets 303 150 - 400 K/uL    nRBC 0.0 0.0 - 0.2 %    Neutrophils Relative % 65 %    Neutro Abs 5.5 1.7 - 7.7 K/uL  Lymphocytes Relative 23 %    Lymphs Abs 2.0 0.7 - 4.0 K/uL    Monocytes Relative 8 %    Monocytes Absolute 0.7 0.1 - 1.0 K/uL    Eosinophils Relative 3 %    Eosinophils Absolute 0.2 0.0 - 0.5 K/uL    Basophils Relative 1 %    Basophils Absolute 0.0 0.0 - 0.1 K/uL    Immature Granulocytes 0 %    Abs Immature Granulocytes 0.03 0.00 - 0.07 K/uL      Comment: Performed at [facility], [place] [address], [place], [place] [address]  Troponin I - ONCE - STAT     Status: None    Collection Time: 12/11/18 10:53 AM  Result Value Ref Range    Troponin I <0.03 <0.03 ng/mL      Comment: Performed at [facility] [address]  [place], [place] [address]  I-Stat beta hCG blood, ED ([facility], [facility], [facility] only)     Status: None    Collection Time: 12/11/18 10:58 AM  Result Value Ref Range    I-stat hCG, quantitative <5.0 <5 mIU/mL    Comment 3               Comment:   GEST. AGE      CONC.  (mIU/mL)   <=1 WEEK        5 - 50     2 WEEKS       50 - 500     3 WEEKS       100 - 10,000     4 WEEKS     1,000 - 30,000        MALE AND NON-PREGNANT MALE:     LESS THAN 5 mIU/mL    Urine rapid drug screen (hosp performed)     Status: None    Collection Time: 12/11/18 11:48 AM  Result Value Ref Range    Opiates NONE DETECTED NONE DETECTED    Cocaine NONE DETECTED NONE DETECTED    Benzodiazepines NONE DETECTED NONE DETECTED    Amphetamines NONE DETECTED NONE DETECTED    Tetrahydrocannabinol NONE DETECTED NONE DETECTED    Barbiturates NONE DETECTED NONE DETECTED      Comment: (NOTE) DRUG SCREEN FOR MEDICAL PURPOSES ONLY.  IF CONFIRMATION IS NEEDED FOR ANY PURPOSE, NOTIFY LAB WITHIN 5 DAYS. LOWEST DETECTABLE LIMITS FOR URINE DRUG SCREEN Drug Class                     Cutoff (ng/mL) Amphetamine and metabolites    1000 Barbiturate and metabolites    200 Benzodiazepine                 338 Tricyclics and metabolites     300 Opiates and metabolites        300 Cocaine and metabolites        300 THC                            50 Performed at [facility], [place] [address], [place], [place] [address]    Urinalysis, Routine w reflex microscopic     Status: Abnormal    Collection Time: 12/11/18 11:48 AM  Result Value Ref Range    Color, Urine STRAW (A) YELLOW    APPearance CLEAR CLEAR    Specific Gravity, Urine 1.008 1.005 - 1.030    pH 7.0 5.0 - 8.0  Glucose, UA NEGATIVE NEGATIVE mg/dL    Hgb urine dipstick NEGATIVE NEGATIVE    Bilirubin Urine NEGATIVE NEGATIVE    Ketones, ur NEGATIVE NEGATIVE mg/dL    Protein, ur NEGATIVE NEGATIVE mg/dL    Nitrite NEGATIVE NEGATIVE    Leukocytes,Ua NEGATIVE NEGATIVE      Comment:  Performed at [facility] [address], [place], [place] [address]  MRSA PCR Screening     Status: None    Collection Time: 12/11/18  6:00 PM  Result Value Ref Range    MRSA by PCR NEGATIVE NEGATIVE      Comment:        The GeneXpert MRSA Assay (FDA approved for NASAL specimens only), is one component of a comprehensive MRSA colonization surveillance program. It is not intended to diagnose MRSA infection nor to guide or monitor treatment for MRSA infections. Performed at [facility], [place] [address] [place] [address]    CBC     Status: None    Collection Time: 12/12/18  4:32 AM  Result Value Ref Range    WBC 7.8 4.0 - 10.5 K/uL    RBC 5.11 4.22 - 5.81 MIL/uL    Hemoglobin 14.4 13.0 - 17.0 g/dL    HCT 43.3 39.0 - 52.0 %    MCV 84.7 80.0 - 100.0 fL    MCH 28.2 26.0 - 34.0 pg    MCHC 33.3 30.0 - 36.0 g/dL    RDW 13.5 11.5 - 15.5 %    Platelets 306 150 - 400 K/uL    nRBC 0.0 0.0 - 0.2 %      Comment: Performed at [facility], [place] [address], [place], [place] [address]  Basic metabolic panel     Status: Abnormal    Collection Time: 12/12/18  4:32 AM  Result Value Ref Range    Sodium 136 135 - 145 mmol/L    Potassium 3.9 3.5 - 5.1 mmol/L    Chloride 97 (L) 98 - 111 mmol/L    CO2 25 22 - 32 mmol/L    Glucose, Bld 82 70 - 99 mg/dL    BUN 9 8 - 23 mg/dL    Creatinine, Ser 0.90 0.61 - 1.24 mg/dL    Calcium 9.6 8.9 - 10.3 mg/dL    GFR calc non Af Amer >60 >60 mL/min    GFR calc Af Amer >60 >60 mL/min    Anion gap 14 5 - 15      Comment: Performed at [facility] [address] [place], [place] [address]  Lipid panel     Status: None    Collection Time: 12/12/18  4:32 AM  Result Value Ref Range    Cholesterol 152 0 - 200 mg/dL    Triglycerides 67 <150 mg/dL    HDL 70 >40 mg/dL    Total CHOL/HDL Ratio 2.2 RATIO    VLDL 13 0 - 40 mg/dL    LDL Cholesterol 69 0 - 99 mg/dL      Comment:        Total Cholesterol/HDL:CHD Risk Coronary  Heart Disease Risk Table                     Men   Women  1/2 Average Risk   3.4   3.3  Average Risk       5.0   4.4  2 X Average Risk   9.6   7.1  3 X  Average Risk  23.4   11.0        Use the calculated Patient Ratio above and the CHD Risk Table to determine the patient's CHD Risk.        ATP III CLASSIFICATION (LDL):  <100     mg/dL   Optimal  100-129  mg/dL   Near or Above                    Optimal  130-159  mg/dL   Borderline  160-189  mg/dL   High  >190     mg/dL   Very High Performed at [facility] [address] [place], [place] [address]    Hemoglobin A1c     Status: Abnormal    Collection Time: 12/12/18  4:32 AM  Result Value Ref Range    Hgb A1c MFr Bld 5.7 (H) 4.8 - 5.6 %      Comment: (NOTE)         Prediabetes: 5.7 - 6.4         Diabetes: >6.4         Glycemic control for adults with diabetes: <7.0      Mean Plasma Glucose 117 mg/dL      Comment: (NOTE) Performed At: [facility] [place], [place] [id number] Rush Farmer MD [id number]    CBC     Status: Abnormal    Collection Time: 12/13/18  4:51 AM  Result Value Ref Range    WBC 10.9 (H) 4.0 - 10.5 K/uL    RBC 4.99 4.22 - 5.81 MIL/uL    Hemoglobin 14.3 13.0 - 17.0 g/dL    HCT 42.3 39.0 - 52.0 %    MCV 84.8 80.0 - 100.0 fL    MCH 28.7 26.0 - 34.0 pg    MCHC 33.8 30.0 - 36.0 g/dL    RDW 13.7 11.5 - 15.5 %    Platelets 328 150 - 400 K/uL    nRBC 0.0 0.0 - 0.2 %      Comment: Performed at [facility], [place] [address], [place], [place] [address]  Basic metabolic panel     Status: Abnormal    Collection Time: 12/13/18  4:51 AM  Result Value Ref Range    Sodium 134 (L) 135 - 145 mmol/L    Potassium 3.8 3.5 - 5.1 mmol/L    Chloride 99 98 - 111 mmol/L    CO2 22 22 - 32 mmol/L    Glucose, Bld 110 (H) 70 - 99 mg/dL    BUN 15 8 - 23 mg/dL    Creatinine, Ser 1.21 0.61 - 1.24 mg/dL    Calcium 9.4 8.9 - 10.3 mg/dL    GFR calc non Af Amer 55 (L) >60 mL/min    GFR  calc Af Amer >60 >60 mL/min    Anion gap 13 5 - 15      Comment: Performed at [facility], [place] [address], [place], [place] [address]       Imaging Results (Last 48 hours)  Ct Head Wo Contrast   Result Date: 12/11/2018 CLINICAL DATA:  Follow-up intracranial hemorrhage. EXAM: CT HEAD WITHOUT CONTRAST TECHNIQUE: Contiguous axial images were obtained from the base of the skull through the vertex without intravenous contrast. COMPARISON:  12/11/2018 FINDINGS: Brain: A 15 x 11 x 10 mm hematoma involving the left thalamus and posterior limb of the left internal capsule is unchanged in size with an estimated  volume of 0.8 mL. There is mild surrounding edema without significant mass effect. No new hemorrhage, acute large territory infarct, or extra-axial fluid collection is identified. Cerebral atrophy is not greater than expected for age. Patchy to confluent cerebral white matter hypodensities are unchanged and nonspecific but compatible with extensive chronic small vessel ischemic disease. There are chronic lacunar infarcts in the basal ganglia bilaterally Vascular: Calcified atherosclerosis at the skull base. No hyperdense vessel. Skull: No fracture or suspicious osseous lesion. Sinuses/Orbits: Paranasal sinuses and mastoid air cells are clear. Unremarkable orbits. Other: Partially visualized fatty replacement of the left greater than right parotid glands. IMPRESSION: 1. Unchanged left thalamic hemorrhage. 2. Extensive chronic small vessel ischemic disease. Electronically Signed   By: Logan Bores M.D.   On: 12/11/2018 17:25    Ct Head Wo Contrast   Result Date: 12/11/2018 CLINICAL DATA:  Acute onset of RIGHT UPPER extremity numbness that began this morning. Current history of hypertension. Personal history of ascending aortic aneurysm repair in 2015. EXAM: CT HEAD WITHOUT CONTRAST TECHNIQUE: Contiguous axial images were obtained from the base of the skull through the vertex without intravenous  contrast. COMPARISON:  11/24/2018 and earlier. FINDINGS: Brain: Approximately 1.4 x 1.2 cm acute bleed involving the POSTERIOR LEFT basal ganglia. No associated midline shift. No acute hemorrhage or hematoma elsewhere. Moderate age related cortical and deep atrophy, unchanged dating back to 79. Severe changes of small vessel disease of the white matter diffusely, progressive since 2013. No extra-axial fluid collections. Vascular: Severe BILATERAL carotid siphon and severe RIGHT vertebral artery atherosclerosis. No hyperdense vessel. Skull: No skull fracture or other focal osseous abnormality involving the skull. Sinuses/Orbits: Visualized paranasal sinuses, bilateral mastoid air cells and bilateral middle ear cavities well-aerated. Benign scleral calcifications involving the LEFT globe. Normal-appearing RIGHT globe. Other: None. IMPRESSION: 1. Acute hemorrhagic stroke/hypertensive hemorrhage involving the POSTERIOR LEFT basal ganglia. No associated midline shift. 2. Moderate age related generalized atrophy and severe chronic microvascular ischemic changes of the white matter. I personally telephoned these critical/emergent results to Dr. Regenia Skeeter of the emergency department at the time of interpretation on 12/11/2018 11:35 a.m. Electronically Signed   By: Evangeline Dakin M.D.   On: 12/11/2018 11:37    Dg Chest Portable 1 View   Result Date: 12/11/2018 CLINICAL DATA:  85 year old with acute onset of RIGHT-sided weakness and difficulty walking that began upon awakening this morning. CT head earlier same day demonstrated a hypertensive hemorrhage/hemorrhagic stroke involving the LEFT basal ganglia. Personal history of ascending thoracic aortic aneurysm repair in 2015. EXAM: PORTABLE CHEST 1 VIEW COMPARISON:  08/11/2018 and earlier, including CTA chest 03/08/2018 and previously. FINDINGS: Prior sternotomy. Cardiac silhouette mildly enlarged for AP portable technique, unchanged. Fibrosis involving the lung bases,  LEFT greater than RIGHT, unchanged since the 08/11/2018 examination. Lungs otherwise clear. Pulmonary vascularity normal. No visible pleural effusions. Degenerative changes involving both shoulder joints as noted previously. IMPRESSION: 1. No acute cardiopulmonary disease. 2. Stable mild cardiomegaly without pulmonary edema. 3. Stable fibrosis involving the lung bases, LEFT greater than RIGHT. Electronically Signed   By: Evangeline Dakin M.D.   On: 12/11/2018 14:45             Medical Problem List and Plan: 1.  Decreased functional mobility secondary to small left basal ganglia ICH likely due to hypertensive crisis             -admit to inpatient rehab 2.  Antithrombotics: -DVT/anticoagulation:  SCDs             -  antiplatelet therapy: N/A 3. Pain Management: Tylenol as needed 4. Mood: Celexa 20 mg daily             -antipsychotic agents: N/A 5. Neuropsych: This patient is capable of making decisions on his own behalf. 6. Skin/Wound Care:  Routine skin checks 7. Fluids/Electrolytes/Nutrition: routine in and out's with follow-up chemistries 8. Hypertension. Avapro 300 mg daily, Lopressor 25 mg twice a day. Monitor as he becomes more active 9. Hypothyroidism. Synthroid. Latest TSH 2.080 10.History of ascending aortic aneurysm repair 2015. Follow-up outpatient 11. GERD. Protonix 12. BPH. Monitor voiding pattern     Post Admission Physician Evaluation: 1. Functional deficits secondary  to left basal ganglia hemorrhage. 2. Patient is admitted to receive collaborative, interdisciplinary care between the physiatrist, rehab nursing staff, and therapy team. 3. Patient's level of medical complexity and substantial therapy needs in context of that medical necessity cannot be provided at a lesser intensity of care such as a SNF. 4. Patient has experienced substantial functional loss from his/her baseline which was documented above under the "Functional History" and "Functional Status" headings.   Judging by the patient's diagnosis, physical exam, and functional history, the patient has potential for functional progress which will result in measurable gains while on inpatient rehab.  These gains will be of substantial and practical use upon discharge  in facilitating mobility and self-care at the household level. 5. Physiatrist will provide 24 hour management of medical needs as well as oversight of the therapy plan/treatment and provide guidance as appropriate regarding the interaction of the two. 6. The Preadmission Screening has been reviewed and patient status is unchanged unless otherwise stated above. 7. 24 hour rehab nursing will assist with bladder management, bowel management, safety, skin/wound care, disease management, medication administration, pain management and patient education  and help integrate therapy concepts, techniques,education, etc. 8. PT will assess and treat for/with: Lower extremity strength, range of motion, stamina, balance, functional mobility, safety, adaptive techniques and equipment, NMR.   Goals are: supervision to mod I. 9. OT will assess and treat for/with: ADL's, functional mobility, safety, upper extremity strength, adaptive techniques and equipment, NMR, community reentry.   Goals are: supervision to mod I. Therapy may proceed with showering this patient. 10. SLP will assess and treat for/with: cognition and communication .  Goals are: supervision to mod I. 11. Case Management and Social Worker will assess and treat for psychological issues and discharge planning. 12. Team conference will be held weekly to assess progress toward goals and to determine barriers to discharge. 13. Patient will receive at least 3 hours of therapy per day at least 5 days per week. 14. ELOS: 14-17 days       15. Prognosis:  excellent   I have personally performed a face to face diagnostic evaluation of this patient and formulated the key components of the plan.  Additionally, I  have personally reviewed laboratory data, imaging studies, as well as relevant notes and concur with the physician assistant's documentation above.  Meredith Staggers, MD, FAAPMR     Lavon Paganini [place], PA-C 12/13/2018

## 2018-12-13 NOTE — Telephone Encounter (Signed)
-----   Message from Lelon Perla, MD sent at 12/07/2018 11:56 AM EDT ----- Arrange telephone visit Kirk Ruths ----- Message ----- From: Rita Ohara, MD Sent: 12/06/2018   9:45 PM EDT To: Lelon Perla, MD  I just wanted you to be aware that his BP's are above goal at home, and of his recent syncope/near-syncope which landed him in the ER with the facial lacerations. He reports he feels dizzy about 1 out of every 5 walks he takes. Feel free to contact him if he is due for visit to address these issues.  Labs are pending from today, I'll forward results to you when they come in.

## 2018-12-13 NOTE — Progress Notes (Signed)
Patient discharged to [facility] Nurse called report and spoke with Roselyn Reef

## 2018-12-13 NOTE — Progress Notes (Signed)
Physical Therapy Treatment Patient Details Name: Danny Frederick MRN: [id number] DOB: 01-16-1934 Today's Date: 12/13/2018    History of Present Illness Pt is a 85 y/o male  presented with c/o unsteady gait.  CT reveals small L basal ganglia hemorrhage. Noted reports a fall 3 weeks ago and has had 3 episodes of dizziness since then.  PMH: HTN, ascending aortic aneurysm repair, pulmonary artery thrombosis, cervical laminectomy, L4-5, L5-S1 fusion.     PT Comments    Patient seen for mobility progression. Pt is making progress toward PT goals and is very motivated to participate in therapy. Pt requires mod A for functional transfers and gait training (+2 for chair follow/safety) this session. Pt presents with balance deficits and decreased activity tolerance. Continue to recommend CIR for further skilled PT services to maximize independence and safety with mobility.     Follow Up Recommendations  CIR     Equipment Recommendations  Other (comment)(TBD next venue)    Recommendations for Other Services       Precautions / Restrictions Precautions Precautions: Fall    Mobility  Bed Mobility Overal bed mobility: Needs Assistance Bed Mobility: Supine to Sit     Supine to sit: Min guard     General bed mobility comments: min guard for safety; use of rail; increased time and effort required   Transfers Overall transfer level: Needs assistance Equipment used: 1 person hand held assist Transfers: Sit to/from Stand Sit to Stand: Mod assist         General transfer comment: assist to power up into standing and to gain balance upon standing   Ambulation/Gait Ambulation/Gait assistance: Mod assist;+2 safety/equipment(chair follow) Gait Distance (Feet): (60 ft X 2 trials) Assistive device: 1 person hand held assist Gait Pattern/deviations: Step-through pattern;Decreased step length - right;Decreased step length - left;Ataxic;Drifts right/left;Narrow base of support Gait velocity:  decreased   General Gait Details: assistance required for balance and weight shifting with HHA +1 and assist at trunk with use of gait belt; pt with R side circumduction and appears to have poor proprioception; fatigued quickly requiring seated rest break; HR and SpO2 WNL   Stairs             Wheelchair Mobility    Modified Rankin (Stroke Patients Only) Modified Rankin (Stroke Patients Only) Pre-Morbid Rankin Score: No symptoms Modified Rankin: Moderately severe disability     Balance Overall balance assessment: Needs assistance   Sitting balance-Leahy Scale: Fair     Standing balance support: Single extremity supported;During functional activity Standing balance-Leahy Scale: Poor Standing balance comment: reliant on at least 1 UE support                            Cognition Arousal/Alertness: Awake/alert Behavior During Therapy: WFL for tasks assessed/performed Overall Cognitive Status: No family/caregiver present to determine baseline cognitive functioning                       Memory: Decreased short-term memory         General Comments: WFL for simple tasks during session      Exercises      General Comments        Pertinent Vitals/Pain Pain Assessment: No/denies pain    Home Living                      Prior Function  PT Goals (current goals can now be found in the care plan section) Progress towards PT goals: Progressing toward goals    Frequency    Min 4X/week      PT Plan Current plan remains appropriate    Co-evaluation              AM-PAC PT "6 Clicks" Mobility   Outcome Measure  Help needed turning from your back to your side while in a flat bed without using bedrails?: A Little Help needed moving from lying on your back to sitting on the side of a flat bed without using bedrails?: A Little Help needed moving to and from a bed to a chair (including a wheelchair)?: A Lot Help  needed standing up from a chair using your arms (e.g., wheelchair or bedside chair)?: A Lot Help needed to walk in hospital room?: A Lot Help needed climbing 3-5 steps with a railing? : A Lot 6 Click Score: 14    End of Session Equipment Utilized During Treatment: Gait belt Activity Tolerance: Patient tolerated treatment well Patient left: in chair;with call bell/phone within reach Nurse Communication: Mobility status PT Visit Diagnosis: Unsteadiness on feet (R26.81);Other abnormalities of gait and mobility (R26.89);Hemiplegia and hemiparesis Hemiplegia - Right/Left: Right Hemiplegia - dominant/non-dominant: Dominant Hemiplegia - caused by: Cerebral infarction     Time: 1342-1400 PT Time Calculation (min) (ACUTE ONLY): 18 min  Charges:  $Gait Training: 8-22 mins                     Earney Navy, PTA Acute Rehabilitation Services Pager: [phone] Office: [phone]     Darliss Cheney 12/13/2018, 2:38 PM

## 2018-12-13 NOTE — PMR Pre-admission (Signed)
PMR Admission Coordinator Pre-Admission Assessment  Patient: Danny Frederick is an 85 y.o., male MRN: [id number] DOB: 02-08-1934 Height: '5\' 4"'$  (162.6 cm) Weight: 62.1 kg  Insurance Information HMO:     PPO:      PCP:      IPA:      80/20:      OTHER:  PRIMARY: Medicare A and B      Policy#: [id number]      Subscriber:  patient CM Name:       Phone#:      Fax#:  Pre-Cert#: Verified on 02/22/7627 via passport onesource online Employer:  Benefits:  Phone #:      Name:  Eff. Date: 10/30/2000     Deduct: $1408      Out of Pocket Max: n/a      Life Max: n/a CIR: 100%      SNF: 100 days Outpatient: 80%     Co-Pay: 20% Home Health: 100%      Co-Pay:  DME: 80%     Co-Pay: 20% Providers: patient's choice SECONDARY: [employer]      Policy#:       Subscriber:  CM Name:       Phone#:      Fax#:  Pre-Cert#:       Employer:  Benefits:  Phone #: [phone]     Name:  Eff. Date:      Deduct:       Out of Pocket Max:       Life Max:  CIR:       SNF:  Outpatient:      Co-Pay:  Home Health:       Co-Pay:  DME:      Co-Pay:   Medicaid Application Date:       Case Manager:  Disability Application Date:       Case Worker:   The "Data Collection Information Summary" for patients in Inpatient Rehabilitation Facilities with attached "Privacy Act [place] Records" was provided and verbally reviewed with: Patient  Emergency Contact Information Contact Information    Name Relation Home Work Mobile   [place] Spouse [phone]     [place] Daughter   [phone]      Current Medical History  Patient Admitting Diagnosis: left basal ganglia ICH  History of Present Illness: Moustapha Tooker is an 85 year old right-handed male with history of hypertension, ascending aortic aneurysm repair 2015, hypertension, BPH.  Presented 12/11/2018 with unsteady gait,dizziness and numbness of right side.Reports of a fall 3 weeks ago. Blood pressure 220/100 on arrival.cranial CT scan showed acute  hemorrhagic stroke hypertensive involving the posterior left basal ganglia no associated midline shift. Troponin negative. Echocardiogram with ejection fraction of 93% normal systolic function. Neurology follow-up [place] felt most likely related to hypertensive episode.Tolerating a regular diet. Therapy evaluations completed with recommendations of physical medicine rehabilitation consult.  Complete NIHSS TOTAL: 3  Patient's medical record from [facility] has been reviewed by the rehabilitation admission coordinator and physician.  Past Medical History  Past Medical History:  Diagnosis Date  . Allergic conjunctivitis   . Allergic rhinitis, cause unspecified    on allergy shots (Dr. Orvil Feil)  . Atherosclerosis of aorta (HCC)    noted on CT angio of abd/pelvis, in many vessels  . BPH (benign prostatic hypertrophy)   . Diverticulosis of colon 12/09  . Foot drop, right   . GERD (gastroesophageal reflux disease)   . Hearing loss in left ear    both ears now  .  History of Clostridium difficile    multiple times 2016--s/p fecal transplant (Dr. Baxter Flattery).  Marland Kitchen Hx of echocardiogram    Echo (03/2014):  Mild LVH, EF 50-55%, no RWMA, Gr 1 DD, mild MR, mild reduced RVSF  . Hypertension   . Internal hemorrhoids   . Iron deficiency anemia, unspecified 6/09  . Mitral valve prolapse    h/o; normal echo 12/2011 with no MVP seen  . 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 Right ventricular dysfunction 01/23/2014   Secondary to obstruction of main pulmonary artery  . Ruptured thoracic aortic aneurysm ([place]) 01/23/2014  . S/P ascending aortic aneurysm repair 01/24/2014  . Unspecified hypothyroidism     Family History   family history includes Cancer in his brother and brother; Depression in his mother; Diabetes in his mother and sister; Emphysema in his brother; Heart disease in his mother; Hypertension in his father and mother; Parkinsonism in his father; Stroke in his father.  Prior  Rehab/Hospitalizations Has the patient had prior rehab or hospitalizations prior to admission? No  Has the patient had major surgery during 100 days prior to admission? No   Current Medications  Current Facility-Administered Medications:  .   stroke: mapping our early stages of recovery book, , Does not apply, Once, Vonzella Nipple, NP .  acetaminophen (TYLENOL) tablet 650 mg, 650 mg, Oral, Q4H PRN, 650 mg at 12/13/18 0823 **OR** [DISCONTINUED] acetaminophen (TYLENOL) solution 650 mg, 650 mg, Per Tube, Q4H PRN **OR** [DISCONTINUED] acetaminophen (TYLENOL) suppository 650 mg, 650 mg, Rectal, Q4H PRN, Vonzella Nipple, NP .  atorvastatin (LIPITOR) tablet 10 mg, 10 mg, Oral, QHS, Rosalin Hawking, MD, 10 mg at 12/12/18 2225 .  cholecalciferol (VITAMIN D3) tablet 2,000 Units, 2,000 Units, Oral, Daily, Rosalin Hawking, MD, 2,000 Units at 12/13/18 1026 .  citalopram (CELEXA) tablet 20 mg, 20 mg, Oral, Daily, Rosalin Hawking, MD, 20 mg at 12/13/18 1026 .  ferrous sulfate tablet 325 mg, 325 mg, Oral, Gennette Pac, MD, 325 mg at 12/12/18 1103 .  irbesartan (AVAPRO) tablet 300 mg, 300 mg, Oral, Daily, Rosalin Hawking, MD, 300 mg at 12/13/18 1026 .  labetalol (NORMODYNE,TRANDATE) injection 5-10 mg, 5-10 mg, Intravenous, Q10 min PRN, Rosalin Hawking, MD, 10 mg at 12/13/18 0212 .  levothyroxine (SYNTHROID, LEVOTHROID) tablet 50 mcg, 50 mcg, Oral, QAC breakfast, Rosalin Hawking, MD, 50 mcg at 12/12/18 2228 .  metoprolol tartrate (LOPRESSOR) tablet 25 mg, 25 mg, Oral, BID, Rosalin Hawking, MD, 25 mg at 12/13/18 1026 .  pantoprazole (PROTONIX) EC tablet 40 mg, 40 mg, Oral, Daily, Rosalin Hawking, MD, 40 mg at 12/13/18 1026 .  senna-docusate (Senokot-S) tablet 1 tablet, 1 tablet, Oral, BID, Vonzella Nipple, NP, 1 tablet at 12/13/18 1026  Patients Current Diet:  Diet Order            Diet Heart Room service appropriate? Yes with Assist; Fluid consistency: Thin  Diet effective now              Precautions /  Restrictions Precautions Precautions: Fall Precaution Comments: SBP drop at 3 minutes standing Restrictions Weight Bearing Restrictions: No   Has the patient had 2 or more falls or a fall with injury in the past year? Yes, pt did have one fall about 3 weeks ago, requiring stitches in his lip.   Prior Activity Level Community (5-7x/wk): going to the gym, still driving  Prior Functional Level Self Care: Did the patient need help bathing, dressing, using the toilet or eating? Independent  Indoor  Mobility: Did the patient need assistance with walking from room to room (with or without device)? Independent  Stairs: Did the patient need assistance with internal or external stairs (with or without device)? Independent  Functional Cognition: Did the patient need help planning regular tasks such as shopping or remembering to take medications? Independent  Home Assistive Devices / Equipment Home Assistive Devices/Equipment: None Home Equipment: Walker - 2 wheels, Cane - single point  Prior Device Use: Indicate devices/aids used by the patient prior to current illness, exacerbation or injury? None of the above  Current Functional Level Cognition  Overall Cognitive Status: Impaired/Different from baseline Current Attention Level: Sustained Orientation Level: Oriented X4 Following Commands: Follows one step commands consistently, Follows one step commands with increased time Safety/Judgement: Decreased awareness of deficits General Comments: pt oriented, poor awareness to deficits and need for assistance     Extremity Assessment (includes Sensation/Coordination)  Upper Extremity Assessment: Defer to OT evaluation RUE Deficits / Details: dysmetric, grossly 3+/5 MMT (limited shoulder ROM- reports baseline), poor proprioception RUE Sensation: decreased light touch, decreased proprioception RUE Coordination: decreased fine motor, decreased gross motor  Lower Extremity Assessment: RLE  deficits/detail, LLE deficits/detail RLE Deficits / Details: Hip flexion 3+/5, quad 4+/5; decr coordination with alternating toe tapping RLE Coordination: decreased gross motor LLE Deficits / Details: 5/5 throughout  LLE Coordination: decreased gross motor    ADLs  Overall ADL's : Needs assistance/impaired Grooming: Wash/dry hands, Wash/dry face, Minimal assistance, Sitting Upper Body Bathing: Minimal assistance, Sitting Lower Body Bathing: Moderate assistance, +2 for safety/equipment, Sit to/from stand Upper Body Dressing : Moderate assistance, Sitting Lower Body Dressing: Maximal assistance, +2 for physical assistance, +2 for safety/equipment, Sit to/from stand Lower Body Dressing Details (indicate cue type and reason): requires total assist to don socks, mod assist +2 sit<>stand Toilet Transfer: Moderate assistance, +2 for safety/equipment, Stand-pivot Toilet Transfer Details (indicate cue type and reason): simulated to recliner  Toileting- Clothing Manipulation and Hygiene: Total assistance, +2 for safety/equipment, Sit to/from stand Functional mobility during ADLs: Moderate assistance, +2 for safety/equipment General ADL Comments: pt limited by impaired balance, R incoordination, and weakness     Mobility  Overal bed mobility: Needs Assistance Bed Mobility: Supine to Sit Supine to sit: Min guard General bed mobility comments: min guard for safety towards R side     Transfers  Overall transfer level: Needs assistance Equipment used: 2 person hand held assist Transfers: Sit to/from Stand Sit to Stand: Mod assist, +2 safety/equipment General transfer comment: Mod assist to steady while powering up; Heavy mod assist for balance with pivotal steps bed to recliner on patient's R side    Ambulation / Gait / Stairs / Wheelchair Mobility  Ambulation/Gait Ambulation/Gait assistance: Mod assist, +2 safety/equipment Gait Distance (Feet): (pivotal steps bed to chair) Assistive device: 2  person hand held assist General Gait Details: Took a few steps bed to chair; resembling festination    Posture / Balance Balance Overall balance assessment: Needs assistance Sitting-balance support: No upper extremity supported, Feet supported Sitting balance-Leahy Scale: Fair Standing balance support: Single extremity supported, During functional activity Standing balance-Leahy Scale: Poor Standing balance comment: reliant on at least 1 UE support    Special needs/care consideration BiPAP/CPAP no CPM no Continuous Drip IV IV labetalol last given at 0212 12/13/2018 Dialysis no        Days n/a Life Vest no Oxygen no Special Bed no Trach Size no Wound Vac (area) no      Location n/a Skin abrasion to the  nose                               Bowel mgmt: continent, last BM 12/12/2018 Bladder mgmt: continent Diabetic mgmt: no Behavioral consideration no Chemo/radiation no   Previous Home Environment (from acute therapy documentation) Living Arrangements: Spouse/significant other Available Help at Discharge: Family, Available 24 hours/day Type of Home: House Home Layout: Two level, Bed/bath upstairs Alternate Level Stairs-Rails: Right, Left Alternate Level Stairs-Number of Steps: 12, landing then 3 Home Access: Stairs to enter Entrance Stairs-Rails: None Entrance Stairs-Number of Steps: 1 Bathroom Shower/Tub: Multimedia programmer: [place]: No  Discharge Living Setting Plans for Discharge Living Setting: Patient's home Type of Home at Discharge: House Discharge Home Layout: Two level, Bed/bath upstairs Alternate Level Stairs-Rails: Can reach both Alternate Level Stairs-Number of Steps: 15 Discharge Home Access: Stairs to enter Entrance Stairs-Rails: None Entrance Stairs-Number of Steps: 1 Discharge Bathroom Shower/Tub: Walk-in shower Discharge Bathroom Toilet: Standard Discharge Bathroom Accessibility: Yes How Accessible: Accessible via  walker Does the patient have any problems obtaining your medications?: No  Social/Family/Support Systems Patient Roles: Spouse Anticipated Caregiver: wife [name], daughter Ivin Booty Anticipated Caregiver's Contact Information: [name] [phone] Ability/Limitations of Caregiver: pt reports wife has some early dementia  Caregiver Availability: 24/7 Discharge Plan Discussed with Primary Caregiver: Yes Is Caregiver In Agreement with Plan?: Yes Does Caregiver/Family have Issues with Lodging/Transportation while Pt is in Rehab?: No  Goals/Additional Needs Patient/Family Goal for Rehab: PT/OT/SLP supervision Expected length of stay: 14-18 days Cultural Considerations: n/a Dietary Needs: heart healthy, thin Equipment Needs: tbd Pt/Family Agrees to Admission and willing to participate: Yes Program Orientation Provided & Reviewed with Pt/Caregiver Including Roles  & Responsibilities: Yes  Possible need for SNF placement upon discharge: not anticipated  Patient Condition: I have reviewed medical records from [facility], spoken with CM, and patient and daughter. I met with patient at the bedside and discussed with his daughter via phone for inpatient rehabilitation assessment.  Patient will benefit from ongoing PT, OT and SLP, can actively participate in 3 hours of therapy a day 5 days of the week, and can make measurable gains during the admission.  Patient will also benefit from the coordinated team approach during an Inpatient Acute Rehabilitation admission.  The patient will receive intensive therapy as well as Rehabilitation physician, nursing, social worker, and care management interventions.  Due to safety, skin/wound care, disease management, medication administration, pain management and patient education the patient requires 24 hour a day rehabilitation nursing.  The patient is currently mod +2 with mobility and basic ADLs.  Discharge setting and therapy post  discharge at home with home health is anticipated.  Patient has agreed to participate in the Acute Inpatient Rehabilitation Program and will admit today.  Preadmission Screen Completed By:  Michel Santee, 12/13/2018 1:08 PM ______________________________________________________________________   Discussed status with Dr. Naaman Plummer on 12/13/18 at 1:15 PM and received approval for admission today.  Admission Coordinator:  Michel Santee, PT, time 1:15 PM/Date 12/13/18    Assessment/Plan: Diagnosis: left Basal ganglia hemorrhage 1. Does the need for close, 24 hr/day Medical supervision in concert with the patient's rehab needs make it unreasonable for this patient to be served in a less intensive setting? Yes 2. Co-Morbidities requiring supervision/potential complications: HTN, BPH, AsAA repair 3. Due to bladder management, bowel management, safety, skin/wound care, disease management, medication administration, pain management and patient education, does the  patient require 24 hr/day rehab nursing? Yes 4. Does the patient require coordinated care of a physician, rehab nurse, PT (1-2 hrs/day, 5 days/week), OT (1-2 hrs/day, 5 days/week) and SLP (1-2 hrs/day, 5 days/week) to address physical and functional deficits in the context of the above medical diagnosis(es)? Yes Addressing deficits in the following areas: balance, endurance, locomotion, strength, transferring, bowel/bladder control, bathing, dressing, feeding, grooming, toileting, cognition, speech and psychosocial support 5. Can the patient actively participate in an intensive therapy program of at least 3 hrs of therapy 5 days a week? Yes 6. The potential for patient to make measurable gains while on inpatient rehab is excellent 7. Anticipated functional outcomes upon discharge from inpatients are: modified independent and supervision PT, modified independent and supervision OT, modified independent and supervision SLP 8. Estimated rehab  length of stay to reach the above functional goals is: 14-18 days 9. Anticipated D/C setting: Home 10. Anticipated post D/C treatments: [place] therapy 11. Overall Rehab/Functional Prognosis: excellent  MD Signature: Meredith Staggers, MD, [facility] 12/13/2018

## 2018-12-13 NOTE — Progress Notes (Signed)
Meredith Staggers, MD  Physician  Physical Medicine and Rehabilitation  PMR Pre-admission  Signed  Date of Service:  12/13/2018 1:07 PM       Related encounter: ED to Hosp-Admission (Current) from 12/11/2018 in [facility]      Signed         Show:Clear all [x] Manual[x] Template[x] Copied  Added by: [x] Meredith Staggers, MD[x] Michel Santee, PT  [] Hover for details PMR Admission Coordinator Pre-Admission Assessment  Patient: Danny Frederick is an 85 y.o., male MRN: [id number] DOB: Jun 01, 1934 Height: 5' 4"  (162.6 cm) Weight: 62.1 kg  Insurance Information HMO:     PPO:      PCP:      IPA:      80/20:      OTHER:  PRIMARY: Medicare A and B      Policy#: [id number]      Subscriber:  patient CM Name:       Phone#:      Fax#:  Pre-Cert#: Verified on 2/95/6213 via passport onesource online Employer:  Benefits:  Phone #:      Name:  Eff. Date: 10/30/2000     Deduct: $1408      Out of Pocket Max: n/a      Life Max: n/a CIR: 100%      SNF: 100 days Outpatient: 80%     Co-Pay: 20% Home Health: 100%      Co-Pay:  DME: 80%     Co-Pay: 20% Providers: patient's choice SECONDARY: [employer]      Policy#:       Subscriber:  CM Name:       Phone#:      Fax#:  Pre-Cert#:       Employer:  Benefits:  Phone #: [phone]     Name:  Eff. Date:      Deduct:       Out of Pocket Max:       Life Max:  CIR:       SNF:  Outpatient:      Co-Pay:  Home Health:       Co-Pay:  DME:      Co-Pay:   Medicaid Application Date:       Case Manager:  Disability Application Date:       Case Worker:   The "Data Collection Information Summary" for patients in Inpatient Rehabilitation Facilities with attached "Privacy Act [place] Records" was provided and verbally reviewed with: Patient  Emergency Contact Information         Contact Information    Name Relation Home Work Mobile   [place] Spouse [phone]     [place] Daughter    [phone]      Current Medical History  Patient Admitting Diagnosis: left basal ganglia ICH  History of Present Illness: Danny Frederick is an 85 year old right-handed male with history of hypertension, ascending aortic aneurysm repair 2015, hypertension, BPH.  Presented 12/11/2018 with unsteady gait,dizziness and numbness of right side.Reports of a fall 3 weeks ago. Blood pressure 220/100 on arrival.cranial CT scan showed acute hemorrhagic stroke hypertensive involving the posterior left basal ganglia no associated midline shift. Troponin negative. Echocardiogram with ejection fraction of 64% normal systolic function. Neurology follow-up [place] felt most likely related to hypertensive episode.Tolerating a regular diet. Therapy evaluations completed with recommendations of physical medicine rehabilitation consult.  Complete NIHSS TOTAL: 3  Patient's medical record from [facility] has been reviewed by the rehabilitation admission coordinator and physician.  Past Medical History  Past Medical History:  Diagnosis Date  . Allergic conjunctivitis   . Allergic rhinitis, cause unspecified    on allergy shots (Dr. Orvil Feil)  . Atherosclerosis of aorta (HCC)    noted on CT angio of abd/pelvis, in many vessels  . BPH (benign prostatic hypertrophy)   . Diverticulosis of colon 12/09  . Foot drop, right   . GERD (gastroesophageal reflux disease)   . Hearing loss in left ear    both ears now  . History of Clostridium difficile    multiple times 2016--s/p fecal transplant (Dr. Baxter Flattery).  Marland Kitchen Hx of echocardiogram    Echo (03/2014):  Mild LVH, EF 50-55%, no RWMA, Gr 1 DD, mild MR, mild reduced RVSF  . Hypertension   . Internal hemorrhoids   . Iron deficiency anemia, unspecified 6/09  . Mitral valve prolapse    h/o; normal echo 12/2011 with no MVP seen  . 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 Right ventricular dysfunction 01/23/2014   Secondary to  obstruction of main pulmonary artery  . Ruptured thoracic aortic aneurysm ([place]) 01/23/2014  . S/P ascending aortic aneurysm repair 01/24/2014  . Unspecified hypothyroidism     Family History   family history includes Cancer in his brother and brother; Depression in his mother; Diabetes in his mother and sister; Emphysema in his brother; Heart disease in his mother; Hypertension in his father and mother; Parkinsonism in his father; Stroke in his father.  Prior Rehab/Hospitalizations Has the patient had prior rehab or hospitalizations prior to admission? No  Has the patient had major surgery during 100 days prior to admission? No              Current Medications  Current Facility-Administered Medications:  .   stroke: mapping our early stages of recovery book, , Does not apply, Once, Vonzella Nipple, NP .  acetaminophen (TYLENOL) tablet 650 mg, 650 mg, Oral, Q4H PRN, 650 mg at 12/13/18 0823 **OR** [DISCONTINUED] acetaminophen (TYLENOL) solution 650 mg, 650 mg, Per Tube, Q4H PRN **OR** [DISCONTINUED] acetaminophen (TYLENOL) suppository 650 mg, 650 mg, Rectal, Q4H PRN, Vonzella Nipple, NP .  atorvastatin (LIPITOR) tablet 10 mg, 10 mg, Oral, QHS, Rosalin Hawking, MD, 10 mg at 12/12/18 2225 .  cholecalciferol (VITAMIN D3) tablet 2,000 Units, 2,000 Units, Oral, Daily, Rosalin Hawking, MD, 2,000 Units at 12/13/18 1026 .  citalopram (CELEXA) tablet 20 mg, 20 mg, Oral, Daily, Rosalin Hawking, MD, 20 mg at 12/13/18 1026 .  ferrous sulfate tablet 325 mg, 325 mg, Oral, Gennette Pac, MD, 325 mg at 12/12/18 1103 .  irbesartan (AVAPRO) tablet 300 mg, 300 mg, Oral, Daily, Rosalin Hawking, MD, 300 mg at 12/13/18 1026 .  labetalol (NORMODYNE,TRANDATE) injection 5-10 mg, 5-10 mg, Intravenous, Q10 min PRN, Rosalin Hawking, MD, 10 mg at 12/13/18 0212 .  levothyroxine (SYNTHROID, LEVOTHROID) tablet 50 mcg, 50 mcg, Oral, QAC breakfast, Rosalin Hawking, MD, 50 mcg at 12/12/18 2228 .  metoprolol tartrate (LOPRESSOR)  tablet 25 mg, 25 mg, Oral, BID, Rosalin Hawking, MD, 25 mg at 12/13/18 1026 .  pantoprazole (PROTONIX) EC tablet 40 mg, 40 mg, Oral, Daily, Rosalin Hawking, MD, 40 mg at 12/13/18 1026 .  senna-docusate (Senokot-S) tablet 1 tablet, 1 tablet, Oral, BID, Vonzella Nipple, NP, 1 tablet at 12/13/18 1026  Patients Current Diet:     Diet Order                  Diet Heart Room service appropriate? Yes with Assist; Fluid consistency: Thin  Diet effective now               Precautions / Restrictions Precautions Precautions: Fall Precaution Comments: SBP drop at 3 minutes standing Restrictions Weight Bearing Restrictions: No   Has the patient had 2 or more falls or a fall with injury in the past year? Yes, pt did have one fall about 3 weeks ago, requiring stitches in his lip.   Prior Activity Level Community (5-7x/wk): going to the gym, still driving  Prior Functional Level Self Care: Did the patient need help bathing, dressing, using the toilet or eating? Independent  Indoor Mobility: Did the patient need assistance with walking from room to room (with or without device)? Independent  Stairs: Did the patient need assistance with internal or external stairs (with or without device)? Independent  Functional Cognition: Did the patient need help planning regular tasks such as shopping or remembering to take medications? Independent  Home Assistive Devices / Equipment Home Assistive Devices/Equipment: None Home Equipment: Walker - 2 wheels, Cane - single point  Prior Device Use: Indicate devices/aids used by the patient prior to current illness, exacerbation or injury? None of the above  Current Functional Level Cognition  Overall Cognitive Status: Impaired/Different from baseline Current Attention Level: Sustained Orientation Level: Oriented X4 Following Commands: Follows one step commands consistently, Follows one step commands with increased time Safety/Judgement:  Decreased awareness of deficits General Comments: pt oriented, poor awareness to deficits and need for assistance     Extremity Assessment (includes Sensation/Coordination)  Upper Extremity Assessment: Defer to OT evaluation RUE Deficits / Details: dysmetric, grossly 3+/5 MMT (limited shoulder ROM- reports baseline), poor proprioception RUE Sensation: decreased light touch, decreased proprioception RUE Coordination: decreased fine motor, decreased gross motor  Lower Extremity Assessment: RLE deficits/detail, LLE deficits/detail RLE Deficits / Details: Hip flexion 3+/5, quad 4+/5; decr coordination with alternating toe tapping RLE Coordination: decreased gross motor LLE Deficits / Details: 5/5 throughout  LLE Coordination: decreased gross motor    ADLs  Overall ADL's : Needs assistance/impaired Grooming: Wash/dry hands, Wash/dry face, Minimal assistance, Sitting Upper Body Bathing: Minimal assistance, Sitting Lower Body Bathing: Moderate assistance, +2 for safety/equipment, Sit to/from stand Upper Body Dressing : Moderate assistance, Sitting Lower Body Dressing: Maximal assistance, +2 for physical assistance, +2 for safety/equipment, Sit to/from stand Lower Body Dressing Details (indicate cue type and reason): requires total assist to don socks, mod assist +2 sit<>stand Toilet Transfer: Moderate assistance, +2 for safety/equipment, Stand-pivot Toilet Transfer Details (indicate cue type and reason): simulated to recliner  Toileting- Clothing Manipulation and Hygiene: Total assistance, +2 for safety/equipment, Sit to/from stand Functional mobility during ADLs: Moderate assistance, +2 for safety/equipment General ADL Comments: pt limited by impaired balance, R incoordination, and weakness     Mobility  Overal bed mobility: Needs Assistance Bed Mobility: Supine to Sit Supine to sit: Min guard General bed mobility comments: min guard for safety towards R side     Transfers   Overall transfer level: Needs assistance Equipment used: 2 person hand held assist Transfers: Sit to/from Stand Sit to Stand: Mod assist, +2 safety/equipment General transfer comment: Mod assist to steady while powering up; Heavy mod assist for balance with pivotal steps bed to recliner on patient's R side    Ambulation / Gait / Stairs / Wheelchair Mobility  Ambulation/Gait Ambulation/Gait assistance: Mod assist, +2 safety/equipment Gait Distance (Feet): (pivotal steps bed to chair) Assistive device: 2 person hand held assist General Gait Details: Took a few steps bed to chair; resembling festination  Posture / Balance Balance Overall balance assessment: Needs assistance Sitting-balance support: No upper extremity supported, Feet supported Sitting balance-Leahy Scale: Fair Standing balance support: Single extremity supported, During functional activity Standing balance-Leahy Scale: Poor Standing balance comment: reliant on at least 1 UE support    Special needs/care consideration BiPAP/CPAP no CPM no Continuous Drip IV IV labetalol last given at 0212 12/13/2018 Dialysis no        Days n/a Life Vest no Oxygen no Special Bed no Trach Size no Wound Vac (area) no      Location n/a Skin abrasion to the nose                               Bowel mgmt: continent, last BM 12/12/2018 Bladder mgmt: continent Diabetic mgmt: no Behavioral consideration no Chemo/radiation no   Previous Home Environment (from acute therapy documentation) Living Arrangements: Spouse/significant other Available Help at Discharge: Family, Available 24 hours/day Type of Home: House Home Layout: Two level, Bed/bath upstairs Alternate Level Stairs-Rails: Right, Left Alternate Level Stairs-Number of Steps: 12, landing then 3 Home Access: Stairs to enter Entrance Stairs-Rails: None Entrance Stairs-Number of Steps: 1 Bathroom Shower/Tub: Multimedia programmer: Standard Home Care Services: No   Discharge Living Setting Plans for Discharge Living Setting: Patient's home Type of Home at Discharge: House Discharge Home Layout: Two level, Bed/bath upstairs Alternate Level Stairs-Rails: Can reach both Alternate Level Stairs-Number of Steps: 15 Discharge Home Access: Stairs to enter Entrance Stairs-Rails: None Entrance Stairs-Number of Steps: 1 Discharge Bathroom Shower/Tub: Walk-in shower Discharge Bathroom Toilet: Standard Discharge Bathroom Accessibility: Yes How Accessible: Accessible via walker Does the patient have any problems obtaining your medications?: No  Social/Family/Support Systems Patient Roles: Spouse Anticipated Caregiver: wife [name], daughter Ivin Booty Anticipated Caregiver's Contact Information: [name] [phone] Ability/Limitations of Caregiver: pt reports wife has some early dementia  Caregiver Availability: 24/7 Discharge Plan Discussed with Primary Caregiver: Yes Is Caregiver In Agreement with Plan?: Yes Does Caregiver/Family have Issues with Lodging/Transportation while Pt is in Rehab?: No  Goals/Additional Needs Patient/Family Goal for Rehab: PT/OT/SLP supervision Expected length of stay: 14-18 days Cultural Considerations: n/a Dietary Needs: heart healthy, thin Equipment Needs: tbd Pt/Family Agrees to Admission and willing to participate: Yes Program Orientation Provided & Reviewed with Pt/Caregiver Including Roles  & Responsibilities: Yes  Possible need for SNF placement upon discharge: not anticipated  Patient Condition: I have reviewed medical records from [facility], spoken with CM, and patient and daughter. I met with patient at the bedside and discussed with his daughter via phone for inpatient rehabilitation assessment.  Patient will benefit from ongoing PT, OT and SLP, can actively participate in 3 hours of therapy a day 5 days of the week, and can make measurable gains during the admission.  Patient will also  benefit from the coordinated team approach during an Inpatient Acute Rehabilitation admission.  The patient will receive intensive therapy as well as Rehabilitation physician, nursing, social worker, and care management interventions.  Due to safety, skin/wound care, disease management, medication administration, pain management and patient education the patient requires 24 hour a day rehabilitation nursing.  The patient is currently mod +2 with mobility and basic ADLs.  Discharge setting and therapy post discharge at home with home health is anticipated.  Patient has agreed to participate in the Acute Inpatient Rehabilitation Program and will admit today.  Preadmission Screen Completed By:  Michel Santee, 12/13/2018 1:08 PM ______________________________________________________________________  Discussed status with Dr. Naaman Plummer on 12/13/18 at 1:15 PM and received approval for admission today.  Admission Coordinator:  Michel Santee, PT, time 1:15 PM/Date 12/13/18    Assessment/Plan: Diagnosis: left Basal ganglia hemorrhage 1. Does the need for close, 24 hr/day Medical supervision in concert with the patient's rehab needs make it unreasonable for this patient to be served in a less intensive setting? Yes 2. Co-Morbidities requiring supervision/potential complications: HTN, BPH, AsAA repair 3. Due to bladder management, bowel management, safety, skin/wound care, disease management, medication administration, pain management and patient education, does the patient require 24 hr/day rehab nursing? Yes 4. Does the patient require coordinated care of a physician, rehab nurse, PT (1-2 hrs/day, 5 days/week), OT (1-2 hrs/day, 5 days/week) and SLP (1-2 hrs/day, 5 days/week) to address physical and functional deficits in the context of the above medical diagnosis(es)? Yes Addressing deficits in the following areas: balance, endurance, locomotion, strength, transferring, bowel/bladder control, bathing,  dressing, feeding, grooming, toileting, cognition, speech and psychosocial support 5. Can the patient actively participate in an intensive therapy program of at least 3 hrs of therapy 5 days a week? Yes 6. The potential for patient to make measurable gains while on inpatient rehab is excellent 7. Anticipated functional outcomes upon discharge from inpatients are: modified independent and supervision PT, modified independent and supervision OT, modified independent and supervision SLP 8. Estimated rehab length of stay to reach the above functional goals is: 14-18 days 9. Anticipated D/C setting: Home 10. Anticipated post D/C treatments: [place] therapy 11. Overall Rehab/Functional Prognosis: excellent  MD Signature: Meredith Staggers, MD, [facility] 12/13/2018         Revision History

## 2018-12-13 NOTE — Telephone Encounter (Signed)
This encounter was created in error - please disregard.

## 2018-12-13 NOTE — Discharge Summary (Addendum)
Stroke Discharge Summary  Patient ID: Danny Frederick   MRN: [id number]      DOB: Nov 02, 1933  Date of Admission: 12/11/2018 Date of Discharge: 12/13/2018  Attending Physician:  Garvin Fila, MD, Stroke MD Consultant(s):   None Patient's PCP:  Rita Ohara, MD  Discharge Diagnoses:  Principal Problem:   ICH (intracerebral hemorrhage) ([place]) - L BG due to uncontrolled HT Active Problems:   Essential hypertension   Syncope   Hyperlipidemia   Family hx-stroke  Past Medical History:  Diagnosis Date  . Allergic conjunctivitis   . Allergic rhinitis, cause unspecified    on allergy shots (Dr. Orvil Feil)  . Atherosclerosis of aorta ([facility])    noted on CT angio of abd/pelvis, in many vessels  . BPH (benign prostatic hypertrophy)   . Diverticulosis of colon 12/09  . Foot drop, right   . GERD (gastroesophageal reflux disease)   . Hearing loss in left ear    both ears now  . History of Clostridium difficile    multiple times 2016--s/p fecal transplant (Dr. Baxter Flattery).  Marland Kitchen Hx of echocardiogram    Echo (03/2014):  Mild LVH, EF 50-55%, no RWMA, Gr 1 DD, mild MR, mild reduced RVSF  . Hypertension   . Internal hemorrhoids   . Iron deficiency anemia, unspecified 6/09  . Mitral valve prolapse    h/o; normal echo 12/2011 with no MVP seen  . 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 Right ventricular dysfunction 01/23/2014   Secondary to obstruction of main pulmonary artery  . Ruptured thoracic aortic aneurysm ([place]) 01/23/2014  . S/P ascending aortic aneurysm repair 01/24/2014  . Unspecified hypothyroidism    Past Surgical History:  Procedure Laterality Date  . CERVICAL LAMINECTOMY  1972   C5-6  . COLONOSCOPY WITH PROPOFOL N/A 11/09/2014   Procedure: COLONOSCOPY WITH PROPOFOL;  Surgeon: Jerene Bears, MD;  Location: [facility];  Service: Gastroenterology;  Laterality: N/A;  . ESOPHAGOGASTRODUODENOSCOPY  10/31/08   normal; Dr. Edison Nasuti  . FECAL TRANSPLANT N/A 11/09/2014   Procedure: FECAL  TRANSPLANT;  Surgeon: Jerene Bears, MD;  Location: [facility];  Service: Gastroenterology;  Laterality: N/A;  . HEMORRHOIDECTOMY WITH HEMORRHOID BANDING  04/07/13  . hemorroidal banding  04/07/2013   x3-Dr.Eric Redmond Pulling  . PROSTATE SURGERY  2007   photovaporization  . ROTATOR CUFF REPAIR  10/2008   left; Dr. Onnie Graham  . SPINE SURGERY  2006   L4-5 disk surgery  . SPINE SURGERY  04/2013   L4-5, L5-S1 fusion.  Dr. Christella Noa  . THORACIC AORTIC ANEURYSM REPAIR N/A 01/23/2014   Procedure: THORACIC ASCENDING ANEURYSM REPAIR (AAA);  Surgeon: Rexene Alberts, MD;  Location: [facility];  Service: Open Heart Surgery;  Laterality: N/A;  . TONSILLECTOMY      Medications to be continued on Rehab Allergies as of 12/13/2018   No Known Allergies     Medication List    STOP taking these medications   aspirin EC 81 MG tablet   salicylic acid-lactic acid 17 % external solution     TAKE these medications   atorvastatin 10 MG tablet Commonly known as:  LIPITOR Take 1 tablet (10 mg total) by mouth at bedtime.   citalopram 20 MG tablet Commonly known as:  CELEXA Take 1 tablet (20 mg total) by mouth daily.   ipratropium 0.03 % nasal spray Commonly known as:  ATROVENT Place 2 sprays into both nostrils 2 (two) times daily as needed for rhinitis.   irbesartan 300 MG  tablet Commonly known as:  AVAPRO Take 1 tablet (300 mg total) by mouth daily.   Iron 325 (65 Fe) MG Tabs Take 325 mg by mouth every other day. Reported on 11/08/2015   metoprolol tartrate 25 MG tablet Commonly known as:  LOPRESSOR Take 1 tablet (25 mg total) by mouth 2 (two) times daily.   omeprazole 20 MG capsule Commonly known as:  PRILOSEC TAKE 1 CAPSULE BY MOUTH  EVERY OTHER DAY What changed:  See the new instructions.   OVER THE COUNTER MEDICATION Place 1 application into both eyes daily as needed (dry eyes/irritation). OTC eye gel   senna-docusate 8.6-50 MG tablet Commonly known as:  Senokot-S Take 1 tablet by mouth 2 (two)  times daily.   Synthroid 50 MCG tablet Generic drug:  levothyroxine TAKE 1 TABLET BY MOUTH  DAILY BEFORE BREAKFAST What changed:  See the new instructions.   triamcinolone cream 0.1 % Commonly known as:  KENALOG Apply 1 application topically 2 (two) times daily.   Vitamin D3 50 MCG (2000 UT) Tabs Take 2,000 Units by mouth daily.       LABORATORY STUDIES CBC    Component Value Date/Time   WBC 10.9 (H) 12/13/2018 0451   RBC 4.99 12/13/2018 0451   HGB 14.3 12/13/2018 0451   HGB 13.9 08/16/2018 1220   HGB 12.7 (L) 12/19/2008 1022   HCT 42.3 12/13/2018 0451   HCT 42.5 08/16/2018 1220   HCT 39.3 12/19/2008 1022   PLT 328 12/13/2018 0451   PLT 372 08/16/2018 1220   MCV 84.8 12/13/2018 0451   MCV 86 08/16/2018 1220   MCV 79 (L) 12/19/2008 1022   MCH 28.7 12/13/2018 0451   MCHC 33.8 12/13/2018 0451   RDW 13.7 12/13/2018 0451   RDW 12.7 08/16/2018 1220   RDW 23.2 (H) 12/19/2008 1022   LYMPHSABS 2.0 12/11/2018 1053   LYMPHSABS 1.4 08/16/2018 1220   LYMPHSABS 1.5 12/19/2008 1022   MONOABS 0.7 12/11/2018 1053   EOSABS 0.2 12/11/2018 1053   EOSABS 0.1 08/16/2018 1220   EOSABS 0.5 12/19/2008 1022   BASOSABS 0.0 12/11/2018 1053   BASOSABS 0.0 08/16/2018 1220   BASOSABS 0.0 12/19/2008 1022   CMP    Component Value Date/Time   NA 134 (L) 12/13/2018 0451   NA 137 12/06/2018 1013   K 3.8 12/13/2018 0451   CL 99 12/13/2018 0451   CO2 22 12/13/2018 0451   GLUCOSE 110 (H) 12/13/2018 0451   BUN 15 12/13/2018 0451   BUN 14 12/06/2018 1013   CREATININE 1.21 12/13/2018 0451   CREATININE 1.26 (H) 10/22/2016 1437   CALCIUM 9.4 12/13/2018 0451   PROT 7.8 12/11/2018 1053   PROT 6.6 12/06/2018 1013   ALBUMIN 3.7 12/11/2018 1053   ALBUMIN 3.9 12/06/2018 1013   AST 24 12/11/2018 1053   ALT 14 12/11/2018 1053   ALKPHOS 68 12/11/2018 1053   BILITOT 1.1 12/11/2018 1053   BILITOT 0.6 12/06/2018 1013   GFRNONAA 55 (L) 12/13/2018 0451   GFRNONAA 73 06/27/2014 1057   GFRAA >60  12/13/2018 0451   GFRAA 84 06/27/2014 1057   COAGS Lab Results  Component Value Date   INR 1.1 12/11/2018   INR 1.4 04/27/2014   INR 1.3 04/18/2014   Lipid Panel    Component Value Date/Time   CHOL 152 12/12/2018 0432   CHOL 148 12/06/2018 1013   TRIG 67 12/12/2018 0432   HDL 70 12/12/2018 0432   HDL 69 12/06/2018 1013   CHOLHDL 2.2 12/12/2018  0432   VLDL 13 12/12/2018 0432   LDLCALC 69 12/12/2018 0432   LDLCALC 67 12/06/2018 1013   HgbA1C  Lab Results  Component Value Date   HGBA1C 5.7 (H) 12/12/2018   Urinalysis    Component Value Date/Time   COLORURINE STRAW (A) 12/11/2018 1148   APPEARANCEUR CLEAR 12/11/2018 1148   LABSPEC 1.008 12/11/2018 1148   LABSPEC 1.015 08/11/2018 0959   PHURINE 7.0 12/11/2018 1148   GLUCOSEU NEGATIVE 12/11/2018 1148   HGBUR NEGATIVE 12/11/2018 1148   BILIRUBINUR NEGATIVE 12/11/2018 1148   BILIRUBINUR negative 08/11/2018 0959   BILIRUBINUR 1+ 11/26/2015 1336   KETONESUR NEGATIVE 12/11/2018 1148   PROTEINUR NEGATIVE 12/11/2018 1148   UROBILINOGEN negative 11/26/2015 1336   UROBILINOGEN 1.0 01/23/2014 1619   NITRITE NEGATIVE 12/11/2018 1148   LEUKOCYTESUR NEGATIVE 12/11/2018 1148   Urine Drug Screen     Component Value Date/Time   LABOPIA NONE DETECTED 12/11/2018 1148   COCAINSCRNUR NONE DETECTED 12/11/2018 1148   LABBENZ NONE DETECTED 12/11/2018 1148   AMPHETMU NONE DETECTED 12/11/2018 1148   THCU NONE DETECTED 12/11/2018 1148   LABBARB NONE DETECTED 12/11/2018 1148    Alcohol Level    Component Value Date/Time   ETH <10 12/11/2018 1053    SIGNIFICANT DIAGNOSTIC STUDIES Ct Head Wo Contrast 12/11/2018 IMPRESSION:  1. Unchanged left thalamic hemorrhage.  2. Extensive chronic small vessel ischemic disease.   Ct Head Wo Contrast 12/11/2018 IMPRESSION:  1. Acute hemorrhagic stroke/hypertensive hemorrhage involving the POSTERIOR LEFT basal ganglia. No associated midline shift.  2. Moderate age related generalized atrophy  and severe chronic microvascular ischemic changes of the white matter.   Dg Chest Portable 1 View 12/11/2018 IMPRESSION:  1. No acute cardiopulmonary disease.  2. Stable mild cardiomegaly without pulmonary edema.  3. Stable fibrosis involving the lung bases, LEFT greater than RIGHT.   Transthoracic Echocardiogram  1. The left ventricle has normal systolic function with an ejection fraction of 60-65%. The cavity size was normal. Left ventricular diastolic Doppler parameters are indeterminate. There is abnormal septal motion likely secondary to post-operative  state. 2. The right ventricle has normal systolic function. The cavity was normal. There is no increase in right ventricular wall thickness. 3. Right atrial size was mildly dilated. 4. The aortic root and ascending aorta are normal in size. 5. No intracardiac thrombi or masses were visualized. 6. There is redundancy of the interatrial septum. No atrial level shunt detected by color flow Doppler.  EKG - SR rate 64 BPM. Question previous MI? (See cardiology reading for complete details)     HISTORY OF PRESENT ILLNESS Danny Frederick is an 85 y.o. male with PMH HTN, ascending aortic aneurysm repair (2015), pulmonary artery thrombosis who presented to [facility] ED with c/o of unsteady gait. Code stroke activated in ED. Patient states that he woke up this morning 12/11/2018 around  6:30 or 7 am and noted some trouble walking while going to the bathroom.  Noticed numbness and over his right hand.  Symptoms persisted and patient called EMS.  Last known normal was 10 PM last night 12/10/2018 and hence he was not stroke alerted on arrival.  CT head was performed and showed a small left basal ganglia hemorrhage.  The pressure was 220/ 100 mmHg on arrival.  Patient did have a fall 3 weeks ago and hit his head and has had 3 episodes of dizziness since then. Also has had SOB for about 3 weeks.  Not on any anticoagulation. No prior stroke history.  Modified  Rankin: Rankin Score=1. ICH score: 1. He was admitted to the neuro ICU for further evaluation and treatment.    HOSPITAL COURSE [name] is a 85 y.o. male with history of HTN, ascending aortic aneurysm repair ( 2015), hypothyroidism, and pulmonary artery thrombosis presenting with an unsteady gait, Rt hand numbness, dizziness, elevated BP, SOB, and recent fall.   ICH - small left basal ganglia hemorrhage likely due to hypertension  Resultant  RUE and RLE ataxia  CT head - Acute hemorrhagic stroke/hypertensive hemorrhage involving the POSTERIOR LEFT basal ganglia  Repeat CT head stable ICH  2D Echo - EF 60-65%  LDL - 69  HgbA1c - 5.7  UDS  - negative  aspirin 81 mg daily prior to admission, now on No antithrombotic  Therapy recommendations:  CIR  Disposition:  CIR  Hypertension ? Orthostatic hypotension  Treated with cardene in the ICU  SBP goal < 160 mm Hg  Elevated during the night - received 2 doses prn labetalol in the past 24h  Resume home avapro 300 and metoprolol 25 bid  Orthostatic vital pending  Long-term BP goal normotensive  Syncope   Recent hx of dizziness, unsteadiness with long walk - presyncope  Fall one week ago - consistent with syncope on description  Was told due to dehydration  Orthostatic pending  May consider adjust BP meds with PCP  Hyperlipidemia  Lipid lowering medication PTA:  Lipitor 10 mg daily  LDL 69, goal < 70  Current lipid lowering medication: lipitor 10  Continue statin at discharge  Other Stroke Risk Factors  Advanced age  Former cigarette smoker - quit 41 years ago  ETOH use, advised to drink no more than 1 alcoholic beverage per day.  Family hx stroke (father)  Hx of PE  Other Active Problems  Thoracic aortic aneurysm s/p repair  MVP   DISCHARGE EXAM Blood pressure (!) 160/85, pulse 63, temperature 98 F (36.7 C), temperature source Oral, resp. rate 16, height 5\' 4"   (1.626 m), weight 62.1 kg, SpO2 99 %. General - Well nourished, well developed, in no apparent distress.  Ophthalmologic - fundi not visualized due to noncooperation.  Cardiovascular - Regular rate and rhythm.  Mental Status -  Level of arousal and orientation to time, place, and person were intact. Language including expression, naming, repetition, comprehension was assessed and found intact.  Cranial Nerves II - XII - II - Visual field intact OU. III, IV, VI - Extraocular movements intact. V - Facial sensation intact bilaterally. VII - slight right facial nasolabial fold flattening. VIII - Hearing & vestibular intact bilaterally. X - Palate elevates symmetrically. XI - Chin turning & shoulder shrug intact bilaterally. XII - Tongue protrusion intact.  Motor Strength - The patient's strength was normal in all extremities and pronator drift was absent except RUE distal 5-/5.  Bulk was normal and fasciculations were absent.   Motor Tone - Muscle tone was assessed at the neck and appendages and was normal.  Reflexes - The patient's reflexes were symmetrical in all extremities and he had no pathological reflexes.  Sensory - Light touch, temperature/pinprick were assessed and were symmetrical except right LE 95% of sensation comparing with left LE.    Coordination - The patient had right FTN and HTS ataxia out of proportion to the weakness.  Tremor was absent.  Gait and Station - deferred.  Discharge Diet   Diet Order            Diet Heart Room service appropriate?  Yes with Assist; Fluid consistency: Thin  Diet effective now             liquids  DISCHARGE PLAN  Disposition:  Transfer to [facility] for ongoing PT, OT and ST  Due to hemorrhage and risk of bleeding, do not take aspirin, aspirin-containing medications, or ibuprofen products   Recommend ongoing stroke risk factor control by Primary Care Physician at time of discharge from inpatient  rehabilitation.  Follow-up Rita Ohara, MD in 2 weeks following discharge from rehab.  Follow-up in [facility] in 4 weeks following discharge from rehab, office to schedule an appointment.   35 minutes were spent preparing discharge.  Burnetta Sabin, MSN, APRN, ANVP-BC, AGPCNP-BC Advanced Practice Stroke Nurse [place] for Schedule & Pager information 12/13/2018 11:40 AM  I have personally obtained history,examined this patient, reviewed notes, independently viewed imaging studies, participated in medical decision making and plan of care.ROS completed by me personally and pertinent positives fully documented  I have made any additions or clarifications directly to the above note. Agree with note above.    Antony Contras, MD Medical Director [facility] Pager: [phone] 12/13/2018 1:34 PM

## 2018-12-13 NOTE — TOC Transition Note (Signed)
Transition of Care [facility]) - CM/SW Discharge Note   Patient Details  Name: Danny Frederick MRN: [id number] Date of Birth: [birth year]  Transition of Care [facility]) CM/SW Contact:  Pollie Friar, RN Phone Number: 12/13/2018, 11:54 AM   Clinical Narrative:    Pt discharging to CIR today. CM signing off.   Final next level of care: IP Rehab Facility Barriers to Discharge: No Barriers Identified   Patient Goals and CMS Choice        Discharge Placement                       Discharge Plan and Services   Discharge Planning Services: CM Consult                      Social Determinants of Health (SDOH) Interventions     Readmission Risk Interventions No flowsheet data found.

## 2018-12-13 NOTE — TOC Initial Note (Signed)
Transition of Care [facility]) - Initial/Assessment Note    Patient Details  Name: Danny Frederick MRN: [id number] Date of Birth: [birth year]  Transition of [facility]) CM/SW Contact:    Pollie Friar, RN Phone Number: 12/13/2018, 11:35 AM  Clinical Narrative:                   Expected Discharge Plan: IP Rehab Facility Barriers to Discharge: Continued Medical Work up   Patient Goals and CMS Choice        Expected Discharge Plan and Services Expected Discharge Plan: [place]   Discharge Planning Services: CM Consult   Living arrangements for the past 2 months: Single Family Home(2 story with bedroom upstairs but could stay on ground level if needed)                          Prior Living Arrangements/Services Living arrangements for the past 2 months: Single Family Home(2 story with bedroom upstairs but could stay on ground level if needed) Lives with:: Spouse(wife has mild dementia) Patient language and need for interpreter reviewed:: Yes(no needs) Do you feel safe going back to the place where you live?: Yes      Need for Family Participation in Patient Care: Yes (Comment) Care giver support system in place?: Yes (comment)(pt states daughter lives close and could stay with them for short time if needed) Current home services: DME(cane, walker, shower chair, 3 in 1) Criminal Activity/Legal Involvement Pertinent to Current Situation/Hospitalization: No - Comment as needed  Activities of Daily Living Home Assistive Devices/Equipment: None ADL Screening (condition at time of admission) Patient's cognitive ability adequate to safely complete daily activities?: Yes Is the patient deaf or have difficulty hearing?: Yes Does the patient have difficulty seeing, even when wearing glasses/contacts?: No Does the patient have difficulty concentrating, remembering, or making decisions?: No Patient able to express need for assistance with ADLs?: Yes Does the patient have  difficulty dressing or bathing?: Yes Independently performs ADLs?: Yes (appropriate for developmental age) Does the patient have difficulty walking or climbing stairs?: Yes Weakness of Legs: Right Weakness of Arms/Hands: None  Permission Sought/Granted                  Emotional Assessment Appearance:: Appears younger than stated age Attitude/Demeanor/Rapport: Engaged(HOH) Affect (typically observed): Accepting, Appropriate, Pleasant Orientation: : Oriented to Self, Oriented to Place, Oriented to  Time, Oriented to Situation   Psych Involvement: No (comment)  Admission diagnosis:  Basal ganglia hemorrhage ([place]) [I61.0] Hypertensive emergency [I16.1] Patient Active Problem List   Diagnosis Date Noted  . ICH (intracerebral hemorrhage) ([place]) 12/11/2018  . Lip laceration 12/07/2018  . Impaired fasting glucose 12/06/2018  . Spinal stenosis of lumbar region 09/08/2017  . Atherosclerosis of aorta ([place]) 11/08/2015  . Recurrent Clostridium difficile diarrhea   . Bacteremia   . S/P ascending aortic aneurysm repair 01/24/2014  . Ruptured thoracic aortic aneurysm ([place]) 01/23/2014  . 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 Right ventricular dysfunction 01/23/2014  . Solitary pulmonary nodule 12/08/2013  . Chest pain 12/02/2013  . Allergic conjunctivitis 10/20/2012  . Syncope 01/22/2012  . Hypothyroidism 01/22/2011  . Essential hypertension, benign 01/22/2011  . GERD (gastroesophageal reflux disease) 01/22/2011  . Iron deficiency anemia 01/22/2011  . IRON DEFICIENCY ANEMIA SECONDARY TO BLOOD LOSS 11/08/2008  . ANEMIA 05/26/2008  . Essential hypertension 03/02/2008  . DYSPNEA 03/02/2008   PCP:  Rita Ohara, MD Pharmacy:   [facility] -  [place], [address] [place] [address] Phone: [phone] Fax: [phone]  [id number] - [place], [address] [place] [address] Phone: [phone] Fax: [phone]     Social Determinants of Health (SDOH) Interventions  Daughter provides transportation. Pt denies issues taking or obtaining his home meds.   Readmission Risk Interventions No flowsheet data found.

## 2018-12-13 NOTE — H&P (Signed)
Physical Medicine and Rehabilitation Admission H&P    Chief Complaint  Patient presents with  . Gait Problem  chief complaint:dizziness  HPI: Danny Frederick is an 85 year old right-handed male with history of hypertension, ascending aortic aneurysm repair 2015, hypertension, BPH. Per chart review lives with spouse. Independent prior to admission and active. 2 level home with bedroom and bathroom upstairs.Presented 12/11/2018 with unsteady gait,dizziness and numbness of right side.Reports of a fall 3 weeks ago. Blood pressure 220/100 on arrival.cranial CT scan showed acute hemorrhagic stroke hypertensive involving the posterior left basal ganglia no associated midline shift. Troponin negative. Echocardiogram with ejection fraction of 17% normal systolic function. Neurology follow-up Eunice felt most likely related to hypertensive episode.Tolerating a regular diet. Therapy evaluations completed with recommendations of physical medicine rehabilitation consult. Patient was admitted for a compress of rehabilitation program.  Review of Systems  Constitutional: Negative for chills and fever.  HENT:       Hearing loss left ear  Eyes: Negative for blurred vision and double vision.  Respiratory: Negative for cough and shortness of breath.   Cardiovascular: Positive for leg swelling. Negative for chest pain and palpitations.  Gastrointestinal: Positive for constipation. Negative for heartburn, nausea and vomiting.       GERD  Genitourinary: Negative for dysuria, hematuria and urgency.  Musculoskeletal: Positive for joint pain and myalgias.       Recent fall 3 weeks ago  Skin: Negative for rash.  Neurological: Positive for dizziness, speech change, focal weakness and headaches.  All other systems reviewed and are negative.  Past Medical History:  Diagnosis Date  . Allergic conjunctivitis   . Allergic rhinitis, cause unspecified    on allergy shots (Dr. Orvil Feil)  . Atherosclerosis of aorta (HCC)     noted on CT angio of abd/pelvis, in many vessels  . BPH (benign prostatic hypertrophy)   . Diverticulosis of colon 12/09  . Foot drop, right   . GERD (gastroesophageal reflux disease)   . Hearing loss in left ear    both ears now  . History of Clostridium difficile    multiple times 2016--s/p fecal transplant (Dr. Baxter Flattery).  Marland Kitchen Hx of echocardiogram    Echo (03/2014):  Mild LVH, EF 50-55%, no RWMA, Gr 1 DD, mild MR, mild reduced RVSF  . Hypertension   . Internal hemorrhoids   . Iron deficiency anemia, unspecified 6/09  . Mitral valve prolapse    h/o; normal echo 12/2011 with no MVP seen  . 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 Right ventricular dysfunction 01/23/2014   Secondary to obstruction of main pulmonary artery  . Ruptured thoracic aortic aneurysm ([place]) 01/23/2014  . S/P ascending aortic aneurysm repair 01/24/2014  . Unspecified hypothyroidism    Past Surgical History:  Procedure Laterality Date  . CERVICAL LAMINECTOMY  1972   C5-6  . COLONOSCOPY WITH PROPOFOL N/A 11/09/2014   Procedure: COLONOSCOPY WITH PROPOFOL;  Surgeon: Jerene Bears, MD;  [facility];  Service: Gastroenterology;  Laterality: N/A;  . ESOPHAGOGASTRODUODENOSCOPY  10/31/08   normal; Dr. Edison Nasuti  . FECAL TRANSPLANT N/A 11/09/2014   Procedure: FECAL TRANSPLANT;  Surgeon: Jerene Bears, MD;  Location: [facility];  Service: Gastroenterology;  Laterality: N/A;  . HEMORRHOIDECTOMY WITH HEMORRHOID BANDING  04/07/13  . hemorroidal banding  04/07/2013   x3-Dr.Eric Redmond Pulling  . PROSTATE SURGERY  2007   photovaporization  . ROTATOR CUFF REPAIR  10/2008   left; Dr. Onnie Graham  . SPINE SURGERY  2006   L4-5 disk  surgery  . SPINE SURGERY  04/2013   L4-5, L5-S1 fusion.  Dr. Christella Noa  . THORACIC AORTIC ANEURYSM REPAIR N/A 01/23/2014   Procedure: THORACIC ASCENDING ANEURYSM REPAIR (AAA);  Surgeon: Rexene Alberts, MD;  Location: [facility];  Service: Open Heart Surgery;  Laterality: N/A;  . TONSILLECTOMY     Family  History  Problem Relation Age of Onset  . Diabetes Mother   . Heart disease Mother        CHF  . Hypertension Mother   . Depression Mother   . Stroke Father   . Parkinsonism Father   . Hypertension Father   . Cancer Brother        bladder cancer  . Cancer Brother        prostate cancer  . Emphysema Brother   . Diabetes Sister   . Colon cancer Neg Hx    Social History:  reports that he quit smoking about 41 years ago. His smoking use included cigarettes. He has never used smokeless tobacco. He reports current alcohol use. He reports that he does not use drugs. Allergies: No Known Allergies Medications Prior to Admission  Medication Sig Dispense Refill  . aspirin EC 81 MG tablet Take 81 mg by mouth at bedtime.    Marland Kitchen atorvastatin (LIPITOR) 10 MG tablet TAKE 1 TABLET BY MOUTH  DAILY (Patient taking differently: Take 10 mg by mouth at bedtime. ) 90 tablet 0  . Cholecalciferol (VITAMIN D3) 2000 units TABS Take 2,000 Units by mouth daily.     . citalopram (CELEXA) 20 MG tablet Take 1 tablet (20 mg total) by mouth daily. 90 tablet 1  . Ferrous Sulfate (IRON) 325 (65 FE) MG TABS Take 325 mg by mouth every other day. Reported on 11/08/2015    . ipratropium (ATROVENT) 0.03 % nasal spray Place 2 sprays into both nostrils 2 (two) times daily as needed for rhinitis.     Marland Kitchen irbesartan (AVAPRO) 300 MG tablet Take 1 tablet (300 mg total) by mouth daily. 90 tablet 3  . metoprolol tartrate (LOPRESSOR) 25 MG tablet Take 1 tablet (25 mg total) by mouth 2 (two) times daily. 180 tablet 3  . omeprazole (PRILOSEC) 20 MG capsule TAKE 1 CAPSULE BY MOUTH  EVERY OTHER DAY (Patient taking differently: Take 20 mg by mouth every other day. ) 45 capsule 0  . OVER THE COUNTER MEDICATION Place 1 application into both eyes daily as needed (dry eyes/irritation). OTC eye gel    . SYNTHROID 50 MCG tablet TAKE 1 TABLET BY MOUTH  DAILY BEFORE BREAKFAST (Patient taking differently: Take 50 mcg by mouth daily before breakfast. )  90 tablet 0  . triamcinolone cream (KENALOG) 0.1 % Apply 1 application topically 2 (two) times daily. 921 g 0  . salicylic acid-lactic acid 17 % external solution Apply to left foot wart once daily (Patient not taking: Reported on 12/11/2018) 14 mL 0    Drug Regimen Review Drug regimen was reviewed and remains appropriate with no significant issues identified  Home: Home Living Family/patient expects to be discharged to:: Private residence Living Arrangements: Spouse/significant other Available Help at Discharge: Family, Available 24 hours/day Type of Home: House Home Access: Stairs to enter [employer] of Steps: 1 Entrance Stairs-Rails: None Home Layout: Two level, Bed/bath upstairs Alternate Level Stairs-Number of Steps: 12, landing then 3 Alternate Level Stairs-Rails: Right, Left Bathroom Shower/Tub: Multimedia programmer: Standard Home Equipment: Environmental consultant - 2 wheels, Cane - single point   Functional History: Prior  Function Level of Independence: Independent Comments: independent, active (enjoys going to the gym)   Functional Status:  Mobility: Bed Mobility Overal bed mobility: Needs Assistance Bed Mobility: Supine to Sit Supine to sit: Min guard General bed mobility comments: min guard for safety towards R side  Transfers Overall transfer level: Needs assistance Equipment used: 2 person hand held assist Transfers: Sit to/from Stand Sit to Stand: Mod assist, +2 safety/equipment General transfer comment: Mod assist to steady while powering up; Heavy mod assist for balance with pivotal steps bed to recliner on patient's R side Ambulation/Gait Ambulation/Gait assistance: Mod assist, +2 safety/equipment Gait Distance (Feet): (pivotal steps bed to chair) Assistive device: 2 person hand held assist General Gait Details: Took a few steps bed to chair; resembling festination    ADL: ADL Overall ADL's : Needs assistance/impaired Grooming: Wash/dry hands,  Wash/dry face, Minimal assistance, Sitting Upper Body Bathing: Minimal assistance, Sitting Lower Body Bathing: Moderate assistance, +2 for safety/equipment, Sit to/from stand Upper Body Dressing : Moderate assistance, Sitting Lower Body Dressing: Maximal assistance, +2 for physical assistance, +2 for safety/equipment, Sit to/from stand Lower Body Dressing Details (indicate cue type and reason): requires total assist to don socks, mod assist +2 sit<>stand Toilet Transfer: Moderate assistance, +2 for safety/equipment, Stand-pivot Toilet Transfer Details (indicate cue type and reason): simulated to recliner  Toileting- Clothing Manipulation and Hygiene: Total assistance, +2 for safety/equipment, Sit to/from stand Functional mobility during ADLs: Moderate assistance, +2 for safety/equipment General ADL Comments: pt limited by impaired balance, R incoordination, and weakness   Cognition: Cognition Overall Cognitive Status: Impaired/Different from baseline Orientation Level: Oriented X4 Cognition Arousal/Alertness: Awake/alert Behavior During Therapy: WFL for tasks assessed/performed Overall Cognitive Status: Impaired/Different from baseline Area of Impairment: Attention, Memory, Following commands, Safety/judgement, Awareness, Problem solving Current Attention Level: Sustained Memory: Decreased short-term memory Following Commands: Follows one step commands consistently, Follows one step commands with increased time Safety/Judgement: Decreased awareness of deficits Awareness: Emergent Problem Solving: Slow processing, Difficulty sequencing, Requires verbal cues General Comments: pt oriented, poor awareness to deficits and need for assistance   Physical Exam: Blood pressure (!) 160/85, pulse 63, temperature 98 F (36.7 C), temperature source Oral, resp. rate 16, height 5\' 4"  (1.626 m), weight 62.1 kg, SpO2 99 %. Physical Exam  Constitutional: No distress.  HENT:  Head: Normocephalic and  atraumatic.  Eyes: Pupils are equal, round, and reactive to light. EOM are normal.  Neck: Normal range of motion. No tracheal deviation present. No thyromegaly present.  Cardiovascular: Normal rate. Exam reveals no friction rub.  No murmur heard. Respiratory: Effort normal. No respiratory distress. He has no wheezes.  GI: Soft. He exhibits no distension. There is no abdominal tenderness.  Musculoskeletal: Normal range of motion.        General: No deformity or edema.  Neurological:  Alert sitting up in bed. Makes good eye contact with examiner. Follows commands. Provides his name and age.speech sl dysarthric. Normal language reasonable insight and awareness. RUE 4/5 prox to distal. RLE 4/5 prox to distal. Mild right pronator drift.LUE and LLE 5/5. Mild decrease in LT RLE.   Skin: Skin is warm. He is not diaphoretic.  Psychiatric: He has a normal mood and affect. His behavior is normal.    Results for orders placed or performed during the hospital encounter of 12/11/18 (from the past 48 hour(s))  Ethanol     Status: None   Collection Time: 12/11/18 10:53 AM  Result Value Ref Range   Alcohol, Ethyl (B) <10 <10 mg/dL  Comment: (NOTE) Lowest detectable limit for serum alcohol is 10 mg/dL. For medical purposes only. Performed at [facility], [place] [address] [place], [place] [address]   Protime-INR     Status: None   Collection Time: 12/11/18 10:53 AM  Result Value Ref Range   Prothrombin Time 13.9 11.4 - 15.2 seconds   INR 1.1 0.8 - 1.2    Comment: (NOTE) INR goal varies based on device and disease states. Performed at [facility], [place] [address], [place], [place] [address]   APTT     Status: None   Collection Time: 12/11/18 10:53 AM  Result Value Ref Range   aPTT 28 24 - 36 seconds    Comment: Performed at [facility] [address] [place], [place] [address]  Comprehensive metabolic panel     Status: None   Collection Time: 12/11/18 10:53 AM  Result  Value Ref Range   Sodium 137 135 - 145 mmol/L   Potassium 4.6 3.5 - 5.1 mmol/L   Chloride 99 98 - 111 mmol/L   CO2 25 22 - 32 mmol/L   Glucose, Bld 94 70 - 99 mg/dL   BUN 11 8 - 23 mg/dL   Creatinine, Ser 0.99 0.61 - 1.24 mg/dL   Calcium 9.4 8.9 - 10.3 mg/dL   Total Protein 7.8 6.5 - 8.1 g/dL   Albumin 3.7 3.5 - 5.0 g/dL   AST 24 15 - 41 U/L   ALT 14 0 - 44 U/L   Alkaline Phosphatase 68 38 - 126 U/L   Total Bilirubin 1.1 0.3 - 1.2 mg/dL   GFR calc non Af Amer >60 >60 mL/min   GFR calc Af Amer >60 >60 mL/min   Anion gap 13 5 - 15    Comment: Performed at [facility] [address] [place], [place] [address]  CBC WITH DIFFERENTIAL     Status: None   Collection Time: 12/11/18 10:53 AM  Result Value Ref Range   WBC 8.4 4.0 - 10.5 K/uL   RBC 5.33 4.22 - 5.81 MIL/uL   Hemoglobin 14.7 13.0 - 17.0 g/dL   HCT 46.2 39.0 - 52.0 %   MCV 86.7 80.0 - 100.0 fL   MCH 27.6 26.0 - 34.0 pg   MCHC 31.8 30.0 - 36.0 g/dL   RDW 13.7 11.5 - 15.5 %   Platelets 303 150 - 400 K/uL   nRBC 0.0 0.0 - 0.2 %   Neutrophils Relative % 65 %   Neutro Abs 5.5 1.7 - 7.7 K/uL   Lymphocytes Relative 23 %   Lymphs Abs 2.0 0.7 - 4.0 K/uL   Monocytes Relative 8 %   Monocytes Absolute 0.7 0.1 - 1.0 K/uL   Eosinophils Relative 3 %   Eosinophils Absolute 0.2 0.0 - 0.5 K/uL   Basophils Relative 1 %   Basophils Absolute 0.0 0.0 - 0.1 K/uL   Immature Granulocytes 0 %   Abs Immature Granulocytes 0.03 0.00 - 0.07 K/uL    Comment: Performed at [facility], [address], [place], [place] [address]  Troponin I - ONCE - STAT     Status: None   Collection Time: 12/11/18 10:53 AM  Result Value Ref Range   Troponin I <0.03 <0.03 ng/mL    Comment: Performed at [facility] [address], [place], [place] [address]  I-Stat beta hCG blood, ED ([facility], [facility], [facility] only)     Status: None  Collection Time: 12/11/18 10:58 AM  Result Value Ref Range   I-stat hCG, quantitative <5.0 <5 mIU/mL   Comment 3             Comment:   GEST. AGE      CONC.  (mIU/mL)   <=1 WEEK        5 - 50     2 WEEKS       50 - 500     3 WEEKS       100 - 10,000     4 WEEKS     1,000 - 30,000        MALE AND NON-PREGNANT MALE:     LESS THAN 5 mIU/mL   Urine rapid drug screen (hosp performed)     Status: None   Collection Time: 12/11/18 11:48 AM  Result Value Ref Range   Opiates NONE DETECTED NONE DETECTED   Cocaine NONE DETECTED NONE DETECTED   Benzodiazepines NONE DETECTED NONE DETECTED   Amphetamines NONE DETECTED NONE DETECTED   Tetrahydrocannabinol NONE DETECTED NONE DETECTED   Barbiturates NONE DETECTED NONE DETECTED    Comment: (NOTE) DRUG SCREEN FOR MEDICAL PURPOSES ONLY.  IF CONFIRMATION IS NEEDED FOR ANY PURPOSE, NOTIFY LAB WITHIN 5 DAYS. LOWEST DETECTABLE LIMITS FOR URINE DRUG SCREEN Drug Class                     Cutoff (ng/mL) Amphetamine and metabolites    1000 Barbiturate and metabolites    200 Benzodiazepine                 509 Tricyclics and metabolites     300 Opiates and metabolites        300 Cocaine and metabolites        300 THC                            50 Performed at [facility], [place] [address] [place], [place] [address]   Urinalysis, Routine w reflex microscopic     Status: Abnormal   Collection Time: 12/11/18 11:48 AM  Result Value Ref Range   Color, Urine STRAW (A) YELLOW   APPearance CLEAR CLEAR   Specific Gravity, Urine 1.008 1.005 - 1.030   pH 7.0 5.0 - 8.0   Glucose, UA NEGATIVE NEGATIVE mg/dL   Hgb urine dipstick NEGATIVE NEGATIVE   Bilirubin Urine NEGATIVE NEGATIVE   Ketones, ur NEGATIVE NEGATIVE mg/dL   Protein, ur NEGATIVE NEGATIVE mg/dL   Nitrite NEGATIVE NEGATIVE   Leukocytes,Ua NEGATIVE NEGATIVE    Comment: Performed at [facility] [address], [place],  [address]  MRSA PCR Screening     Status: None   Collection Time: 12/11/18  6:00 PM  Result Value Ref Range   MRSA by PCR NEGATIVE NEGATIVE    Comment:        The GeneXpert  MRSA Assay (FDA approved for NASAL specimens only), is one component of a comprehensive MRSA colonization surveillance program. It is not intended to diagnose MRSA infection nor to guide or monitor treatment for MRSA infections. Performed at [facility], [place] [address], [place] [address]   CBC     Status: None   Collection Time: 12/12/18  4:32 AM  Result Value Ref Range   WBC 7.8 4.0 - 10.5 K/uL   RBC 5.11 4.22 - 5.81 MIL/uL   Hemoglobin 14.4 13.0 -  17.0 g/dL   HCT 43.3 39.0 - 52.0 %   MCV 84.7 80.0 - 100.0 fL   MCH 28.2 26.0 - 34.0 pg   MCHC 33.3 30.0 - 36.0 g/dL   RDW 13.5 11.5 - 15.5 %   Platelets 306 150 - 400 K/uL   nRBC 0.0 0.0 - 0.2 %    Comment: Performed at [facility], [place] [address] [place], [place] [address]  Basic metabolic panel     Status: Abnormal   Collection Time: 12/12/18  4:32 AM  Result Value Ref Range   Sodium 136 135 - 145 mmol/L   Potassium 3.9 3.5 - 5.1 mmol/L   Chloride 97 (L) 98 - 111 mmol/L   CO2 25 22 - 32 mmol/L   Glucose, Bld 82 70 - 99 mg/dL   BUN 9 8 - 23 mg/dL   Creatinine, Ser 0.90 0.61 - 1.24 mg/dL   Calcium 9.6 8.9 - 10.3 mg/dL   GFR calc non Af Amer >60 >60 mL/min   GFR calc Af Amer >60 >60 mL/min   Anion gap 14 5 - 15    Comment: Performed at [facility] [address], [place], [place] [address]  Lipid panel     Status: None   Collection Time: 12/12/18  4:32 AM  Result Value Ref Range   Cholesterol 152 0 - 200 mg/dL   Triglycerides 67 <150 mg/dL   HDL 70 >40 mg/dL   Total CHOL/HDL Ratio 2.2 RATIO   VLDL 13 0 - 40 mg/dL   LDL Cholesterol 69 0 - 99 mg/dL    Comment:        Total Cholesterol/HDL:CHD Risk Coronary Heart Disease Risk Table                     Men   Women  1/2 Average Risk   3.4   3.3  Average Risk       5.0   4.4  2 X Average Risk   9.6   7.1  3 X Average Risk  23.4   11.0        Use the calculated Patient Ratio above and the CHD Risk Table to determine the patient's CHD  Risk.        ATP III CLASSIFICATION (LDL):  <100     mg/dL   Optimal  100-129  mg/dL   Near or Above                    Optimal  130-159  mg/dL   Borderline  160-189  mg/dL   High  >190     mg/dL   Very High Performed at [facility] [address], [place], [place] [address]   Hemoglobin A1c     Status: Abnormal   Collection Time: 12/12/18  4:32 AM  Result Value Ref Range   Hgb A1c MFr Bld 5.7 (H) 4.8 - 5.6 %    Comment: (NOTE)         Prediabetes: 5.7 - 6.4         Diabetes: >6.4         Glycemic control for adults with diabetes: <7.0    Mean Plasma Glucose 117 mg/dL    Comment: (NOTE) Performed At: [facility] [place], [place] [id number] Rush Farmer MD YC:[id number]   CBC     Status: Abnormal   Collection Time: 12/13/18  4:51 AM  Result Value  Ref Range   WBC 10.9 (H) 4.0 - 10.5 K/uL   RBC 4.99 4.22 - 5.81 MIL/uL   Hemoglobin 14.3 13.0 - 17.0 g/dL   HCT 42.3 39.0 - 52.0 %   MCV 84.8 80.0 - 100.0 fL   MCH 28.7 26.0 - 34.0 pg   MCHC 33.8 30.0 - 36.0 g/dL   RDW 13.7 11.5 - 15.5 %   Platelets 328 150 - 400 K/uL   nRBC 0.0 0.0 - 0.2 %    Comment: Performed at [facility], [place] [address] [place], [place] [address]  Basic metabolic panel     Status: Abnormal   Collection Time: 12/13/18  4:51 AM  Result Value Ref Range   Sodium 134 (L) 135 - 145 mmol/L   Potassium 3.8 3.5 - 5.1 mmol/L   Chloride 99 98 - 111 mmol/L   CO2 22 22 - 32 mmol/L   Glucose, Bld 110 (H) 70 - 99 mg/dL   BUN 15 8 - 23 mg/dL   Creatinine, Ser 1.21 0.61 - 1.24 mg/dL   Calcium 9.4 8.9 - 10.3 mg/dL   GFR calc non Af Amer 55 (L) >60 mL/min   GFR calc Af Amer >60 >60 mL/min   Anion gap 13 5 - 15    Comment: Performed at [facility], [place] [address] [place], [place] [address]   Ct Head Wo Contrast  Result Date: 12/11/2018 CLINICAL DATA:  Follow-up intracranial hemorrhage. EXAM: CT HEAD WITHOUT CONTRAST TECHNIQUE: Contiguous axial images were obtained  from the base of the skull through the vertex without intravenous contrast. COMPARISON:  12/11/2018 FINDINGS: Brain: A 15 x 11 x 10 mm hematoma involving the left thalamus and posterior limb of the left internal capsule is unchanged in size with an estimated volume of 0.8 mL. There is mild surrounding edema without significant mass effect. No new hemorrhage, acute large territory infarct, or extra-axial fluid collection is identified. Cerebral atrophy is not greater than expected for age. Patchy to confluent cerebral white matter hypodensities are unchanged and nonspecific but compatible with extensive chronic small vessel ischemic disease. There are chronic lacunar infarcts in the basal ganglia bilaterally Vascular: Calcified atherosclerosis at the skull base. No hyperdense vessel. Skull: No fracture or suspicious osseous lesion. Sinuses/Orbits: Paranasal sinuses and mastoid air cells are clear. Unremarkable orbits. Other: Partially visualized fatty replacement of the left greater than right parotid glands. IMPRESSION: 1. Unchanged left thalamic hemorrhage. 2. Extensive chronic small vessel ischemic disease. Electronically Signed   By: Logan Bores M.D.   On: 12/11/2018 17:25   Ct Head Wo Contrast  Result Date: 12/11/2018 CLINICAL DATA:  Acute onset of RIGHT UPPER extremity numbness that began this morning. Current history of hypertension. Personal history of ascending aortic aneurysm repair in 2015. EXAM: CT HEAD WITHOUT CONTRAST TECHNIQUE: Contiguous axial images were obtained from the base of the skull through the vertex without intravenous contrast. COMPARISON:  11/24/2018 and earlier. FINDINGS: Brain: Approximately 1.4 x 1.2 cm acute bleed involving the POSTERIOR LEFT basal ganglia. No associated midline shift. No acute hemorrhage or hematoma elsewhere. Moderate age related cortical and deep atrophy, unchanged dating back to 63. Severe changes of small vessel disease of the white matter diffusely,  progressive since 2013. No extra-axial fluid collections. Vascular: Severe BILATERAL carotid siphon and severe RIGHT vertebral artery atherosclerosis. No hyperdense vessel. Skull: No skull fracture or other focal osseous abnormality involving the skull. Sinuses/Orbits: Visualized paranasal sinuses, bilateral mastoid air cells and bilateral middle ear  cavities well-aerated. Benign scleral calcifications involving the LEFT globe. Normal-appearing RIGHT globe. Other: None. IMPRESSION: 1. Acute hemorrhagic stroke/hypertensive hemorrhage involving the POSTERIOR LEFT basal ganglia. No associated midline shift. 2. Moderate age related generalized atrophy and severe chronic microvascular ischemic changes of the white matter. I personally telephoned these critical/emergent results to Dr. Regenia Skeeter of the emergency department at the time of interpretation on 12/11/2018 11:35 a.m. Electronically Signed   By: Evangeline Dakin M.D.   On: 12/11/2018 11:37   Dg Chest Portable 1 View  Result Date: 12/11/2018 CLINICAL DATA:  85 year old with acute onset of RIGHT-sided weakness and difficulty walking that began upon awakening this morning. CT head earlier same day demonstrated a hypertensive hemorrhage/hemorrhagic stroke involving the LEFT basal ganglia. Personal history of ascending thoracic aortic aneurysm repair in 2015. EXAM: PORTABLE CHEST 1 VIEW COMPARISON:  08/11/2018 and earlier, including CTA chest 03/08/2018 and previously. FINDINGS: Prior sternotomy. Cardiac silhouette mildly enlarged for AP portable technique, unchanged. Fibrosis involving the lung bases, LEFT greater than RIGHT, unchanged since the 08/11/2018 examination. Lungs otherwise clear. Pulmonary vascularity normal. No visible pleural effusions. Degenerative changes involving both shoulder joints as noted previously. IMPRESSION: 1. No acute cardiopulmonary disease. 2. Stable mild cardiomegaly without pulmonary edema. 3. Stable fibrosis involving the lung  bases, LEFT greater than RIGHT. Electronically Signed   By: Evangeline Dakin M.D.   On: 12/11/2018 14:45       Medical Problem List and Plan: 1.  Decreased functional mobility secondary to small left basal ganglia ICH likely due to hypertensive crisis  -admit to inpatient rehab 2.  Antithrombotics: -DVT/anticoagulation:  SCDs  -antiplatelet therapy: N/A 3. Pain Management: Tylenol as needed 4. Mood: Celexa 20 mg daily  -antipsychotic agents: N/A 5. Neuropsych: This patient is capable of making decisions on his own behalf. 6. Skin/Wound Care:  Routine skin checks 7. Fluids/Electrolytes/Nutrition: routine in and out's with follow-up chemistries 8. Hypertension. Avapro 300 mg daily, Lopressor 25 mg twice a day. Monitor as he becomes more active 9. Hypothyroidism. Synthroid. Latest TSH 2.080 10.History of ascending aortic aneurysm repair 2015. Follow-up outpatient 11. GERD. Protonix 12. BPH. Monitor voiding pattern     Cathlyn Parsons, PA-C 12/13/2018

## 2018-12-13 NOTE — Progress Notes (Addendum)
Inpatient Rehab Admissions:  Inpatient Rehab Consult received.  I met with patient at the bedside for rehabilitation assessment and to discuss goals and expectations of an inpatient rehab admission.  He is interested in CIR stay following acute hospitalization and asked that I confer with his daughter.  I will contact her and follow up to discuss with MD for possible rehab admission later today.    Addendum: I have MD clearance from Dr. Leonie Man for rehab admission today.  I will inform CM, CSW, RN, and pt/family.   Signed: Shann Medal, PT, DPT Admissions Coordinator [phone] 12/13/18  10:17 AM

## 2018-12-14 ENCOUNTER — Inpatient Hospital Stay (HOSPITAL_COMMUNITY): Payer: Self-pay

## 2018-12-14 ENCOUNTER — Inpatient Hospital Stay (HOSPITAL_COMMUNITY): Payer: Self-pay | Admitting: Occupational Therapy

## 2018-12-14 DIAGNOSIS — I69393 Ataxia following cerebral infarction: Secondary | ICD-10-CM

## 2018-12-14 DIAGNOSIS — G8191 Hemiplegia, unspecified affecting right dominant side: Secondary | ICD-10-CM

## 2018-12-14 LAB — COMPREHENSIVE METABOLIC PANEL
ALT: 15 U/L (ref 0–44)
AST: 34 U/L (ref 15–41)
Albumin: 3.4 g/dL — ABNORMAL LOW (ref 3.5–5.0)
Alkaline Phosphatase: 58 U/L (ref 38–126)
Anion gap: 13 (ref 5–15)
BUN: 16 mg/dL (ref 8–23)
CO2: 24 mmol/L (ref 22–32)
Calcium: 9.5 mg/dL (ref 8.9–10.3)
Chloride: 99 mmol/L (ref 98–111)
Creatinine, Ser: 1.11 mg/dL (ref 0.61–1.24)
GFR calc Af Amer: >60 mL/min (ref >60)
GFR calc non Af Amer: >60 mL/min (ref >60)
Glucose, Bld: 89 mg/dL (ref 70–99)
Potassium: 3.6 mmol/L (ref 3.5–5.1)
Sodium: 136 mmol/L (ref 135–145)
Total Bilirubin: 1.4 mg/dL — ABNORMAL HIGH (ref 0.3–1.2)
Total Protein: 7.4 g/dL (ref 6.5–8.1)

## 2018-12-14 LAB — CBC WITH DIFFERENTIAL/PLATELET
Abs Immature Granulocytes: 0.04 10*3/uL (ref 0.00–0.07)
Basophils Absolute: 0 10*3/uL (ref 0.0–0.1)
Basophils Relative: 0 %
Eosinophils Absolute: 0.1 10*3/uL (ref 0.0–0.5)
Eosinophils Relative: 1 %
HCT: 41.7 % (ref 39.0–52.0)
Hemoglobin: 13.9 g/dL (ref 13.0–17.0)
Immature Granulocytes: 0 %
Lymphocytes Relative: 20 %
Lymphs Abs: 1.9 10*3/uL (ref 0.7–4.0)
MCH: 28.4 pg (ref 26.0–34.0)
MCHC: 33.3 g/dL (ref 30.0–36.0)
MCV: 85.3 fL (ref 80.0–100.0)
Monocytes Absolute: 1.2 10*3/uL — ABNORMAL HIGH (ref 0.1–1.0)
Monocytes Relative: 12 %
Neutro Abs: 6.4 10*3/uL (ref 1.7–7.7)
Neutrophils Relative %: 67 %
Platelets: 297 10*3/uL (ref 150–400)
RBC: 4.89 MIL/uL (ref 4.22–5.81)
RDW: 13.7 % (ref 11.5–15.5)
WBC: 9.6 10*3/uL (ref 4.0–10.5)
nRBC: 0 % (ref 0.0–0.2)

## 2018-12-14 MED ORDER — AMLODIPINE BESYLATE 2.5 MG PO TABS
2.5000 mg | ORAL_TABLET | Freq: Every day | ORAL | Status: DC
  Administered 2018-12-15 – 2018-12-18 (×4): 2.5 mg via ORAL
  Filled 2018-12-14 (×4): qty 1

## 2018-12-14 MED ORDER — POLYVINYL ALCOHOL 1.4 % OP SOLN
1.0000 [drp] | OPHTHALMIC | Status: DC | PRN
  Administered 2018-12-14 – 2018-12-24 (×13): 1 [drp] via OPHTHALMIC
  Filled 2018-12-14: qty 15

## 2018-12-14 NOTE — Evaluation (Signed)
Occupational Therapy Assessment and Plan  Patient Details  Name: Danny Frederick MRN: [id number] Date of Birth: 24-Apr-1934  OT Diagnosis: abnormal posture, hemiplegia affecting dominant side and muscle weakness (generalized) Rehab Potential: Rehab Potential (ACUTE ONLY): Good ELOS: 10-12 days   Today's Date: 12/14/2018 OT Individual Time: 2751-7001 OT Individual Time Calculation (min): 61 min     Problem List:  Patient Active Problem List   Diagnosis Date Noted  . Hyperlipidemia 12/13/2018  . Family hx-stroke 12/13/2018  . ICH (intracerebral hemorrhage) (HCC) - L BG 12/11/2018  . Lip laceration 12/07/2018  . Impaired fasting glucose 12/06/2018  . Spinal stenosis of lumbar region 09/08/2017  . Atherosclerosis of aorta ([place]) 11/08/2015  . Recurrent Clostridium difficile diarrhea   . Bacteremia   . S/P ascending aortic aneurysm repair 01/24/2014  . Ruptured thoracic aortic aneurysm ([place]) 01/23/2014  . 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 Right ventricular dysfunction 01/23/2014  . Solitary pulmonary nodule 12/08/2013  . Chest pain 12/02/2013  . Allergic conjunctivitis 10/20/2012  . Syncope 01/22/2012  . Hypothyroidism 01/22/2011  . Essential hypertension, benign 01/22/2011  . GERD (gastroesophageal reflux disease) 01/22/2011  . Iron deficiency anemia 01/22/2011  . IRON DEFICIENCY ANEMIA SECONDARY TO BLOOD LOSS 11/08/2008  . ANEMIA 05/26/2008  . Essential hypertension 03/02/2008  . DYSPNEA 03/02/2008    Past Medical History:  Past Medical History:  Diagnosis Date  . Allergic conjunctivitis   . Allergic rhinitis, cause unspecified    on allergy shots (Dr. Orvil Feil)  . Atherosclerosis of aorta (HCC)    noted on CT angio of abd/pelvis, in many vessels  . BPH (benign prostatic hypertrophy)   . Diverticulosis of colon 12/09  . Foot drop, right   . GERD (gastroesophageal reflux disease)   . Hearing loss in left ear    both ears now  . History of Clostridium  difficile    multiple times 2016--s/p fecal transplant (Dr. Baxter Flattery).  Marland Kitchen Hx of echocardiogram    Echo (03/2014):  Mild LVH, EF 50-55%, no RWMA, Gr 1 DD, mild MR, mild reduced RVSF  . Hypertension   . Internal hemorrhoids   . Iron deficiency anemia, unspecified 6/09  . Mitral valve prolapse    h/o; normal echo 12/2011 with no MVP seen  . 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 Right ventricular dysfunction 01/23/2014   Secondary to obstruction of main pulmonary artery  . Ruptured thoracic aortic aneurysm ([place]) 01/23/2014  . S/P ascending aortic aneurysm repair 01/24/2014  . Unspecified hypothyroidism    Past Surgical History:  Past Surgical History:  Procedure Laterality Date  . CERVICAL LAMINECTOMY  1972   C5-6  . COLONOSCOPY WITH PROPOFOL N/A 11/09/2014   Procedure: COLONOSCOPY WITH PROPOFOL;  Surgeon: Jerene Bears, MD;  Location: [facility];  Service: Gastroenterology;  Laterality: N/A;  . ESOPHAGOGASTRODUODENOSCOPY  10/31/08   normal; Dr. Edison Nasuti  . FECAL TRANSPLANT N/A 11/09/2014   Procedure: FECAL TRANSPLANT;  Surgeon: Jerene Bears, MD;  Location: [facility];  Service: Gastroenterology;  Laterality: N/A;  . HEMORRHOIDECTOMY WITH HEMORRHOID BANDING  04/07/13  . hemorroidal banding  04/07/2013   x3-Dr.Eric Redmond Pulling  . PROSTATE SURGERY  2007   photovaporization  . ROTATOR CUFF REPAIR  10/2008   left; Dr. Onnie Graham  . SPINE SURGERY  2006   L4-5 disk surgery  . SPINE SURGERY  04/2013   L4-5, L5-S1 fusion.  Dr. Christella Noa  . THORACIC AORTIC ANEURYSM REPAIR N/A 01/23/2014   Procedure: THORACIC ASCENDING ANEURYSM REPAIR ([facility]);  Surgeon:  Occupational Therapy Assessment and Plan  Patient Details  Name: Danny Frederick MRN: [id number] Date of Birth: 24-Apr-1934  OT Diagnosis: abnormal posture, hemiplegia affecting dominant side and muscle weakness (generalized) Rehab Potential: Rehab Potential (ACUTE ONLY): Good ELOS: 10-12 days   Today's Date: 12/14/2018 OT Individual Time: 2751-7001 OT Individual Time Calculation (min): 61 min     Problem List:  Patient Active Problem List   Diagnosis Date Noted  . Hyperlipidemia 12/13/2018  . Family hx-stroke 12/13/2018  . ICH (intracerebral hemorrhage) (HCC) - L BG 12/11/2018  . Lip laceration 12/07/2018  . Impaired fasting glucose 12/06/2018  . Spinal stenosis of lumbar region 09/08/2017  . Atherosclerosis of aorta ([place]) 11/08/2015  . Recurrent Clostridium difficile diarrhea   . Bacteremia   . S/P ascending aortic aneurysm repair 01/24/2014  . Ruptured thoracic aortic aneurysm ([place]) 01/23/2014  . 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 Right ventricular dysfunction 01/23/2014  . Solitary pulmonary nodule 12/08/2013  . Chest pain 12/02/2013  . Allergic conjunctivitis 10/20/2012  . Syncope 01/22/2012  . Hypothyroidism 01/22/2011  . Essential hypertension, benign 01/22/2011  . GERD (gastroesophageal reflux disease) 01/22/2011  . Iron deficiency anemia 01/22/2011  . IRON DEFICIENCY ANEMIA SECONDARY TO BLOOD LOSS 11/08/2008  . ANEMIA 05/26/2008  . Essential hypertension 03/02/2008  . DYSPNEA 03/02/2008    Past Medical History:  Past Medical History:  Diagnosis Date  . Allergic conjunctivitis   . Allergic rhinitis, cause unspecified    on allergy shots (Dr. Orvil Feil)  . Atherosclerosis of aorta (HCC)    noted on CT angio of abd/pelvis, in many vessels  . BPH (benign prostatic hypertrophy)   . Diverticulosis of colon 12/09  . Foot drop, right   . GERD (gastroesophageal reflux disease)   . Hearing loss in left ear    both ears now  . History of Clostridium  difficile    multiple times 2016--s/p fecal transplant (Dr. Baxter Flattery).  Marland Kitchen Hx of echocardiogram    Echo (03/2014):  Mild LVH, EF 50-55%, no RWMA, Gr 1 DD, mild MR, mild reduced RVSF  . Hypertension   . Internal hemorrhoids   . Iron deficiency anemia, unspecified 6/09  . Mitral valve prolapse    h/o; normal echo 12/2011 with no MVP seen  . 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 Right ventricular dysfunction 01/23/2014   Secondary to obstruction of main pulmonary artery  . Ruptured thoracic aortic aneurysm ([place]) 01/23/2014  . S/P ascending aortic aneurysm repair 01/24/2014  . Unspecified hypothyroidism    Past Surgical History:  Past Surgical History:  Procedure Laterality Date  . CERVICAL LAMINECTOMY  1972   C5-6  . COLONOSCOPY WITH PROPOFOL N/A 11/09/2014   Procedure: COLONOSCOPY WITH PROPOFOL;  Surgeon: Jerene Bears, MD;  Location: [facility];  Service: Gastroenterology;  Laterality: N/A;  . ESOPHAGOGASTRODUODENOSCOPY  10/31/08   normal; Dr. Edison Nasuti  . FECAL TRANSPLANT N/A 11/09/2014   Procedure: FECAL TRANSPLANT;  Surgeon: Jerene Bears, MD;  Location: [facility];  Service: Gastroenterology;  Laterality: N/A;  . HEMORRHOIDECTOMY WITH HEMORRHOID BANDING  04/07/13  . hemorroidal banding  04/07/2013   x3-Dr.Eric Redmond Pulling  . PROSTATE SURGERY  2007   photovaporization  . ROTATOR CUFF REPAIR  10/2008   left; Dr. Onnie Graham  . SPINE SURGERY  2006   L4-5 disk surgery  . SPINE SURGERY  04/2013   L4-5, L5-S1 fusion.  Dr. Christella Noa  . THORACIC AORTIC ANEURYSM REPAIR N/A 01/23/2014   Procedure: THORACIC ASCENDING ANEURYSM REPAIR ([facility]);  Surgeon:  Occupational Therapy Assessment and Plan  Patient Details  Name: Danny Frederick MRN: [id number] Date of Birth: 24-Apr-1934  OT Diagnosis: abnormal posture, hemiplegia affecting dominant side and muscle weakness (generalized) Rehab Potential: Rehab Potential (ACUTE ONLY): Good ELOS: 10-12 days   Today's Date: 12/14/2018 OT Individual Time: 2751-7001 OT Individual Time Calculation (min): 61 min     Problem List:  Patient Active Problem List   Diagnosis Date Noted  . Hyperlipidemia 12/13/2018  . Family hx-stroke 12/13/2018  . ICH (intracerebral hemorrhage) (HCC) - L BG 12/11/2018  . Lip laceration 12/07/2018  . Impaired fasting glucose 12/06/2018  . Spinal stenosis of lumbar region 09/08/2017  . Atherosclerosis of aorta ([place]) 11/08/2015  . Recurrent Clostridium difficile diarrhea   . Bacteremia   . S/P ascending aortic aneurysm repair 01/24/2014  . Ruptured thoracic aortic aneurysm ([place]) 01/23/2014  . 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 Right ventricular dysfunction 01/23/2014  . Solitary pulmonary nodule 12/08/2013  . Chest pain 12/02/2013  . Allergic conjunctivitis 10/20/2012  . Syncope 01/22/2012  . Hypothyroidism 01/22/2011  . Essential hypertension, benign 01/22/2011  . GERD (gastroesophageal reflux disease) 01/22/2011  . Iron deficiency anemia 01/22/2011  . IRON DEFICIENCY ANEMIA SECONDARY TO BLOOD LOSS 11/08/2008  . ANEMIA 05/26/2008  . Essential hypertension 03/02/2008  . DYSPNEA 03/02/2008    Past Medical History:  Past Medical History:  Diagnosis Date  . Allergic conjunctivitis   . Allergic rhinitis, cause unspecified    on allergy shots (Dr. Orvil Feil)  . Atherosclerosis of aorta (HCC)    noted on CT angio of abd/pelvis, in many vessels  . BPH (benign prostatic hypertrophy)   . Diverticulosis of colon 12/09  . Foot drop, right   . GERD (gastroesophageal reflux disease)   . Hearing loss in left ear    both ears now  . History of Clostridium  difficile    multiple times 2016--s/p fecal transplant (Dr. Baxter Flattery).  Marland Kitchen Hx of echocardiogram    Echo (03/2014):  Mild LVH, EF 50-55%, no RWMA, Gr 1 DD, mild MR, mild reduced RVSF  . Hypertension   . Internal hemorrhoids   . Iron deficiency anemia, unspecified 6/09  . Mitral valve prolapse    h/o; normal echo 12/2011 with no MVP seen  . 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 Right ventricular dysfunction 01/23/2014   Secondary to obstruction of main pulmonary artery  . Ruptured thoracic aortic aneurysm ([place]) 01/23/2014  . S/P ascending aortic aneurysm repair 01/24/2014  . Unspecified hypothyroidism    Past Surgical History:  Past Surgical History:  Procedure Laterality Date  . CERVICAL LAMINECTOMY  1972   C5-6  . COLONOSCOPY WITH PROPOFOL N/A 11/09/2014   Procedure: COLONOSCOPY WITH PROPOFOL;  Surgeon: Jerene Bears, MD;  Location: [facility];  Service: Gastroenterology;  Laterality: N/A;  . ESOPHAGOGASTRODUODENOSCOPY  10/31/08   normal; Dr. Edison Nasuti  . FECAL TRANSPLANT N/A 11/09/2014   Procedure: FECAL TRANSPLANT;  Surgeon: Jerene Bears, MD;  [facility]: [facility];  Service: Gastroenterology;  Laterality: N/A;  . HEMORRHOIDECTOMY WITH HEMORRHOID BANDING  04/07/13  . hemorroidal banding  04/07/2013   x3-Dr.Eric Redmond Pulling  . PROSTATE SURGERY  2007   photovaporization  . ROTATOR CUFF REPAIR  10/2008   left; Dr. Onnie Graham  . SPINE SURGERY  2006   L4-5 disk surgery  . SPINE SURGERY  04/2013   L4-5, L5-S1 fusion.  Dr. Christella Noa  . THORACIC AORTIC ANEURYSM REPAIR N/A 01/23/2014   Procedure: THORACIC ASCENDING ANEURYSM REPAIR ([facility]);  Surgeon:  Occupational Therapy Assessment and Plan  Patient Details  Name: Danny Frederick MRN: [id number] Date of Birth: 24-Apr-1934  OT Diagnosis: abnormal posture, hemiplegia affecting dominant side and muscle weakness (generalized) Rehab Potential: Rehab Potential (ACUTE ONLY): Good ELOS: 10-12 days   Today's Date: 12/14/2018 OT Individual Time: 2751-7001 OT Individual Time Calculation (min): 61 min     Problem List:  Patient Active Problem List   Diagnosis Date Noted  . Hyperlipidemia 12/13/2018  . Family hx-stroke 12/13/2018  . ICH (intracerebral hemorrhage) (HCC) - L BG 12/11/2018  . Lip laceration 12/07/2018  . Impaired fasting glucose 12/06/2018  . Spinal stenosis of lumbar region 09/08/2017  . Atherosclerosis of aorta ([place]) 11/08/2015  . Recurrent Clostridium difficile diarrhea   . Bacteremia   . S/P ascending aortic aneurysm repair 01/24/2014  . Ruptured thoracic aortic aneurysm ([place]) 01/23/2014  . 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 Right ventricular dysfunction 01/23/2014  . Solitary pulmonary nodule 12/08/2013  . Chest pain 12/02/2013  . Allergic conjunctivitis 10/20/2012  . Syncope 01/22/2012  . Hypothyroidism 01/22/2011  . Essential hypertension, benign 01/22/2011  . GERD (gastroesophageal reflux disease) 01/22/2011  . Iron deficiency anemia 01/22/2011  . IRON DEFICIENCY ANEMIA SECONDARY TO BLOOD LOSS 11/08/2008  . ANEMIA 05/26/2008  . Essential hypertension 03/02/2008  . DYSPNEA 03/02/2008    Past Medical History:  Past Medical History:  Diagnosis Date  . Allergic conjunctivitis   . Allergic rhinitis, cause unspecified    on allergy shots (Dr. Orvil Feil)  . Atherosclerosis of aorta (HCC)    noted on CT angio of abd/pelvis, in many vessels  . BPH (benign prostatic hypertrophy)   . Diverticulosis of colon 12/09  . Foot drop, right   . GERD (gastroesophageal reflux disease)   . Hearing loss in left ear    both ears now  . History of Clostridium  difficile    multiple times 2016--s/p fecal transplant (Dr. Baxter Flattery).  Marland Kitchen Hx of echocardiogram    Echo (03/2014):  Mild LVH, EF 50-55%, no RWMA, Gr 1 DD, mild MR, mild reduced RVSF  . Hypertension   . Internal hemorrhoids   . Iron deficiency anemia, unspecified 6/09  . Mitral valve prolapse    h/o; normal echo 12/2011 with no MVP seen  . 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 Right ventricular dysfunction 01/23/2014   Secondary to obstruction of main pulmonary artery  . Ruptured thoracic aortic aneurysm ([place]) 01/23/2014  . S/P ascending aortic aneurysm repair 01/24/2014  . Unspecified hypothyroidism    Past Surgical History:  Past Surgical History:  Procedure Laterality Date  . CERVICAL LAMINECTOMY  1972   C5-6  . COLONOSCOPY WITH PROPOFOL N/A 11/09/2014   Procedure: COLONOSCOPY WITH PROPOFOL;  Surgeon: Jerene Bears, MD;  Location: [facility];  Service: Gastroenterology;  Laterality: N/A;  . ESOPHAGOGASTRODUODENOSCOPY  10/31/08   normal; Dr. Edison Nasuti  . FECAL TRANSPLANT N/A 11/09/2014   Procedure: FECAL TRANSPLANT;  Surgeon: Jerene Bears, MD;  Location: [facility];  Service: Gastroenterology;  Laterality: N/A;  . HEMORRHOIDECTOMY WITH HEMORRHOID BANDING  04/07/13  . hemorroidal banding  04/07/2013   x3-Dr.Eric Redmond Pulling  . PROSTATE SURGERY  2007   photovaporization  . ROTATOR CUFF REPAIR  10/2008   left; Dr. Onnie Graham  . SPINE SURGERY  2006   L4-5 disk surgery  . SPINE SURGERY  04/2013   L4-5, L5-S1 fusion.  Dr. Christella Noa  . THORACIC AORTIC ANEURYSM REPAIR N/A 01/23/2014   Procedure: THORACIC ASCENDING ANEURYSM REPAIR ([facility]);  Surgeon:

## 2018-12-14 NOTE — Progress Notes (Signed)
Inpatient Rehabilitation  Patient information reviewed and entered into eRehab system by Orvan Papadakis M. Gregorio Worley, M.A., CCC/SLP, PPS Coordinator.  Information including medical coding, functional ability and quality indicators will be reviewed and updated through discharge.    

## 2018-12-14 NOTE — Care Management Note (Signed)
Inpatient [facility] Statement of Services  Patient Name:  Danny Frederick  Date:  12/14/2018  Welcome to the [facility].  Our goal is to provide you with an individualized program based on your diagnosis and situation, designed to meet your specific needs.  With this comprehensive rehabilitation program, you will be expected to participate in at least 3 hours of rehabilitation therapies Monday-Friday, with modified therapy programming on the weekends.  Your rehabilitation program will include the following services:  Physical Therapy (PT), Occupational Therapy (OT), Speech Therapy (ST), 24 hour per day rehabilitation nursing, Neuropsychology, Case Management (Social Worker), Rehabilitation Medicine, Nutrition Services and Pharmacy Services  Weekly team conferences will be held on Wednesday to discuss your progress.  Your Social Worker will talk with you frequently to get your input and to update you on team discussions.  Team conferences with you and your family in attendance may also be held.  Expected length of stay: 14 days  Overall anticipated outcome: supervision with cueing-independent with ADL's  Depending on your progress and recovery, your program may change. Your Social Worker will coordinate services and will keep you informed of any changes. Your Social Worker's name and contact numbers are listed  below.  The following services may also be recommended but are not provided by the [facility] will be made to provide these services after discharge if needed.  Arrangements include referral to agencies that provide these services.  Your insurance has been verified to be:  Medic[employer] & [place] Your primary doctor is:  Rita Ohara  Pertinent information will be shared with your doctor and your insurance  company.  Social Worker:  Ovidio Kin, [place] or ([phone]  Information discussed with and copy given to patient by: Elease Hashimoto, 12/14/2018, 9:56 AM

## 2018-12-14 NOTE — Progress Notes (Signed)
Social Work  Social Work Assessment and Plan  Patient Details  Name: Danny Frederick MRN: [id number] Date of Birth: 08-23-1934  Today's Date: 12/14/2018  Problem List:  Patient Active Problem List   Diagnosis Date Noted  . Hyperlipidemia 12/13/2018  . Family hx-stroke 12/13/2018  . ICH (intracerebral hemorrhage) (HCC) - L BG 12/11/2018  . Lip laceration 12/07/2018  . Impaired fasting glucose 12/06/2018  . Spinal stenosis of lumbar region 09/08/2017  . Atherosclerosis of aorta ([place]) 11/08/2015  . Recurrent Clostridium difficile diarrhea   . Bacteremia   . S/P ascending aortic aneurysm repair 01/24/2014  . Ruptured thoracic aortic aneurysm ([place]) 01/23/2014  . 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 Right ventricular dysfunction 01/23/2014  . Solitary pulmonary nodule 12/08/2013  . Chest pain 12/02/2013  . Allergic conjunctivitis 10/20/2012  . Syncope 01/22/2012  . Hypothyroidism 01/22/2011  . Essential hypertension, benign 01/22/2011  . GERD (gastroesophageal reflux disease) 01/22/2011  . Iron deficiency anemia 01/22/2011  . IRON DEFICIENCY ANEMIA SECONDARY TO BLOOD LOSS 11/08/2008  . ANEMIA 05/26/2008  . Essential hypertension 03/02/2008  . DYSPNEA 03/02/2008   Past Medical History:  Past Medical History:  Diagnosis Date  . Allergic conjunctivitis   . Allergic rhinitis, cause unspecified    on allergy shots (Dr. Orvil Feil)  . Atherosclerosis of aorta (HCC)    noted on CT angio of abd/pelvis, in many vessels  . BPH (benign prostatic hypertrophy)   . Diverticulosis of colon 12/09  . Foot drop, right   . GERD (gastroesophageal reflux disease)   . Hearing loss in left ear    both ears now  . History of Clostridium difficile    multiple times 2016--s/p fecal transplant (Dr. Baxter Flattery).  Marland Kitchen Hx of echocardiogram    Echo (03/2014):  Mild LVH, EF 50-55%, no RWMA, Gr 1 DD, mild MR, mild reduced RVSF  . Hypertension   . Internal hemorrhoids   . Iron deficiency anemia,  unspecified 6/09  . Mitral valve prolapse    h/o; normal echo 12/2011 with no MVP seen  . 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 Right ventricular dysfunction 01/23/2014   Secondary to obstruction of main pulmonary artery  . Ruptured thoracic aortic aneurysm ([place]) 01/23/2014  . S/P ascending aortic aneurysm repair 01/24/2014  . Unspecified hypothyroidism    Past Surgical History:  Past Surgical History:  Procedure Laterality Date  . CERVICAL LAMINECTOMY  1972   C5-6  . COLONOSCOPY WITH PROPOFOL N/A 11/09/2014   Procedure: COLONOSCOPY WITH PROPOFOL;  Surgeon: Jerene Bears, MD;  Location: [facility];  Service: Gastroenterology;  Laterality: N/A;  . ESOPHAGOGASTRODUODENOSCOPY  10/31/08   normal; Dr. Edison Nasuti  . FECAL TRANSPLANT N/A 11/09/2014   Procedure: FECAL TRANSPLANT;  Surgeon: Jerene Bears, MD;  Location: [facility];  Service: Gastroenterology;  Laterality: N/A;  . HEMORRHOIDECTOMY WITH HEMORRHOID BANDING  04/07/13  . hemorroidal banding  04/07/2013   x3-Dr.Eric Redmond Pulling  . PROSTATE SURGERY  2007   photovaporization  . ROTATOR CUFF REPAIR  10/2008   left; Dr. Onnie Graham  . SPINE SURGERY  2006   L4-5 disk surgery  . SPINE SURGERY  04/2013   L4-5, L5-S1 fusion.  Dr. Christella Noa  . THORACIC AORTIC ANEURYSM REPAIR N/A 01/23/2014   Procedure: THORACIC ASCENDING ANEURYSM REPAIR (AAA);  Surgeon: Rexene Alberts, MD;  Location: [facility];  Service: Open Heart Surgery;  Laterality: N/A;  . TONSILLECTOMY     Social History:  reports that he quit smoking about 41 years ago. His  smoking use included cigarettes. He has never used smokeless tobacco. He reports current alcohol use. He reports that he does not use drugs.  Family / Support Systems Marital Status: Married Patient Roles: Spouse, Parent Spouse/Significant Other: Danny Frederick [phone] Children: Danny Frederick [place]-daughter [phone] 2 blocks away Other Supports: Another daughter in [place] but has church members Anticipated Caregiver: Wife and  daughter-mostly pt himself Ability/Limitations of Caregiver: Wife has early dementia but is able to stay home alone.  Caregiver Availability: 24/7 Family Dynamics: Close knit with family and daughter's. They have friends and church members who are supportive and involved. Pt feels for their ages they have pretty good supports.  Social History Preferred language: English Religion: None Cultural Background: No issues Education: Secretary/administrator educated Read: Yes Write: Yes Employment Status: Retired Public relations account executive Issues: No issues Guardian/Conservator: None-according to MD pt is capable of making his own decisions while here   Abuse/Neglect Abuse/Neglect Assessment Can Be Completed: Yes Physical Abuse: Denies Verbal Abuse: Denies Sexual Abuse: Denies Exploitation of patient/patient's resources: Denies Self-Neglect: Denies  Emotional Status Pt's affect, behavior and adjustment status: Pt is motivated to do well and get home, he feels bad his wife can't come up and see him. He feels he needs to get to mod/i before going home due to she is there but has memory issues. His daughter who lives close can come by and check on them daily. Recent Psychosocial Issues: Other health issues-wife's diagnosis is getting him down still trying to navigate living with someone with dementia Psychiatric History: History of depression due to wife's current condition-taking medication which feels it is helpful, will make neruo-psych referral to see to provide coping and support.  Substance Abuse History: No issues  Patient / Family Perceptions, Expectations & Goals Pt/Family understanding of illness & functional limitations: Pt can explain his fall and ICH as a result. He does talk with the MD's and feels he has a good understanding of his condition and treatment plan going forward. He will continue to remain positive and push through. Premorbid pt/family roles/activities: Husband, father, grandfather,  retiree, church member, friend, etc Anticipated changes in roles/activities/participation: resume Pt/family expectations/goals: Pt states: " I need to be independent before going home, I do not want to burden my wife or daughter."  Daughter states: " I will check and try to be there if I can."  [employer]: None Premorbid Home Care/DME Agencies: Other (Comment)(had in past-has rw, cane, tub seat and bsc) Transportation available at discharge: Daughter pt was driving prior to admission Resource referrals recommended: Neuropsychology, Support group (specify)  Discharge Planning Living Arrangements: Spouse/significant other Support Systems: Spouse/significant other, Children, Friends/neighbors, Immunologist, Other relatives Type of Residence: Private residence Insurance Resources: [employer], Multimedia programmer (specify)(AARP) Financial Resources: Fish farm manager, Family Support Financial Screen Referred: No Living Expenses: Own Money Management: Spouse, Patient Does the patient have any problems obtaining your medications?: No Home Management: he and wife  Patient/Family Preliminary Plans: Return home with wife who can be there but not be depended upon to provide any care to him. Their daughter can check on them and come by daily. Will await therapy team evaluations and work on the best plan for him. Sw Barriers to Discharge: Decreased caregiver support Sw Barriers to Discharge Comments: Wife has early dementia Social Work Anticipated Follow Up Needs: HH/OP, Support Group  Clinical Impression Pleasant gentleman who is motivated to do well but somewhat depressed over his wife's diagnosis and trying to navigate through it. He is on an  antidepressant and has seen a therapist but is not longer seeing one. He worries about his daughter in [place] who is a RT and on the front lines. Will work on discharge and provide support, will make neuro-psych consult to be seen  while here.  Danny Frederick 12/14/2018, 9:53 AM

## 2018-12-14 NOTE — Evaluation (Signed)
Physical Therapy Assessment and Plan  Patient Details  Name: Danny Frederick MRN: [id number] Date of Birth: Mar 08, 1934  PT Diagnosis: Ataxia, Ataxic gait, Difficulty walking, Hemiparesis dominant and Impaired sensation Rehab Potential: Good ELOS: 2 weeks   Today's Date: 12/14/2018 PT Individual Time: 0800-0900 PT Individual Time Calculation (min): 60 min    Problem List:  Patient Active Problem List   Diagnosis Date Noted  . Hyperlipidemia 12/13/2018  . Family hx-stroke 12/13/2018  . ICH (intracerebral hemorrhage) (HCC) - L BG 12/11/2018  . Lip laceration 12/07/2018  . Impaired fasting glucose 12/06/2018  . Spinal stenosis of lumbar region 09/08/2017  . Atherosclerosis of aorta ([place]) 11/08/2015  . Recurrent Clostridium difficile diarrhea   . Bacteremia   . S/P ascending aortic aneurysm repair 01/24/2014  . Ruptured thoracic aortic aneurysm ([place]) 01/23/2014  . 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 Right ventricular dysfunction 01/23/2014  . Solitary pulmonary nodule 12/08/2013  . Chest pain 12/02/2013  . Allergic conjunctivitis 10/20/2012  . Syncope 01/22/2012  . Hypothyroidism 01/22/2011  . Essential hypertension, benign 01/22/2011  . GERD (gastroesophageal reflux disease) 01/22/2011  . Iron deficiency anemia 01/22/2011  . IRON DEFICIENCY ANEMIA SECONDARY TO BLOOD LOSS 11/08/2008  . ANEMIA 05/26/2008  . Essential hypertension 03/02/2008  . DYSPNEA 03/02/2008    Past Medical History:  Past Medical History:  Diagnosis Date  . Allergic conjunctivitis   . Allergic rhinitis, cause unspecified    on allergy shots (Dr. Orvil Feil)  . Atherosclerosis of aorta (HCC)    noted on CT angio of abd/pelvis, in many vessels  . BPH (benign prostatic hypertrophy)   . Diverticulosis of colon 12/09  . Foot drop, right   . GERD (gastroesophageal reflux disease)   . Hearing loss in left ear    both ears now  . History of Clostridium difficile    multiple times 2016--s/p fecal  transplant (Dr. Baxter Flattery).  Marland Kitchen Hx of echocardiogram    Echo (03/2014):  Mild LVH, EF 50-55%, no RWMA, Gr 1 DD, mild MR, mild reduced RVSF  . Hypertension   . Internal hemorrhoids   . Iron deficiency anemia, unspecified 6/09  . Mitral valve prolapse    h/o; normal echo 12/2011 with no MVP seen  . 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 Right ventricular dysfunction 01/23/2014   Secondary to obstruction of main pulmonary artery  . Ruptured thoracic aortic aneurysm ([place]) 01/23/2014  . S/P ascending aortic aneurysm repair 01/24/2014  . Unspecified hypothyroidism    Past Surgical History:  Past Surgical History:  Procedure Laterality Date  . CERVICAL LAMINECTOMY  1972   C5-6  . COLONOSCOPY WITH PROPOFOL N/A 11/09/2014   Procedure: COLONOSCOPY WITH PROPOFOL;  Surgeon: Jerene Bears, MD;  [facility];  Service: Gastroenterology;  Laterality: N/A;  . ESOPHAGOGASTRODUODENOSCOPY  10/31/08   normal; Dr. Edison Nasuti  . FECAL TRANSPLANT N/A 11/09/2014   Procedure: FECAL TRANSPLANT;  Surgeon: Jerene Bears, MD;  [facility];  Service: Gastroenterology;  Laterality: N/A;  . HEMORRHOIDECTOMY WITH HEMORRHOID BANDING  04/07/13  . hemorroidal banding  04/07/2013   x3-Dr.Eric Redmond Pulling  . PROSTATE SURGERY  2007   photovaporization  . ROTATOR CUFF REPAIR  10/2008   left; Dr. Onnie Graham  . SPINE SURGERY  2006   L4-5 disk surgery  . SPINE SURGERY  04/2013   L4-5, L5-S1 fusion.  Dr. Christella Noa  . THORACIC AORTIC ANEURYSM REPAIR N/A 01/23/2014   Procedure: THORACIC ASCENDING ANEURYSM REPAIR (AAA);  Surgeon: Rexene Alberts, MD;  Location[facility];  Service: Open Heart Surgery;  Laterality: N/A;  . TONSILLECTOMY      Assessment & Plan Clinical Impression: Patient is a 85 y.o. year old male with history of hypertension, ascending aortic aneurysm repair 2015, hypertension, BPH. Per chart review lives with spouse. Independent prior to admission and active. 2 level home with bedroom and bathroom  upstairs.Presented 12/11/2018 with unsteady gait,dizziness and numbness of right side.Reports of a fall 3 weeks ago. Blood pressure 220/100 on arrival.cranial CT scan showed acute hemorrhagic stroke hypertensive involving the posterior left basal ganglia no associated midline shift. Troponin negative. Echocardiogram with ejection fraction of 29% normal systolic function. Neurology follow-up [place] felt most likely related to hypertensive episode.Tolerating a regular diet. Therapy evaluations completed with recommendations of physical medicine rehabilitation consult. Patient was admitted for a compress of rehabilitation program.  Patient transferred to CIR on 12/13/2018 .   Patient currently requires mod with mobility secondary to muscle weakness, ataxia and decreased coordination and decreased standing balance, decreased postural control and decreased balance strategies.  Prior to hospitalization, patient was independent  with mobility and lived with Spouse in a House home.  Home access is 1Stairs to enter.  Patient will benefit from skilled PT intervention to maximize safe functional mobility, minimize fall risk and decrease caregiver burden for planned discharge home with 24 hour supervision.  Anticipate patient will benefit from follow up [place] at discharge.  PT - End of Session Activity Tolerance: Tolerates 30+ min activity with multiple rests Endurance Deficit: Yes PT Assessment Rehab Potential (ACUTE/IP ONLY): Good PT Barriers to Discharge: Behavior;Lack of/limited family support;Inaccessible home environment PT Barriers to Discharge Comments: 2 story home, wife with dementia PT Patient demonstrates impairments in the following area(s): Balance;Behavior;Endurance;Motor;Nutrition;Pain;Sensory;Perception;Safety PT Transfers Functional Problem(s): Bed Mobility;Bed to Chair;Furniture;Car;Floor PT Locomotion Functional Problem(s): Ambulation;Wheelchair Mobility;Stairs PT Plan PT Intensity: Minimum of 1-2  x/day ,45 to 90 minutes PT Frequency: 5 out of 7 days PT Duration Estimated Length of Stay: 2 weeks PT Treatment/Interventions: Ambulation/gait training;Balance/vestibular training;Cognitive remediation/compensation;Community reintegration;Discharge planning;Neuromuscular re-education;Splinting/orthotics;UE/LE Coordination activities;UE/LE Strength taining/ROM;Functional mobility training;Functional electrical stimulation;Psychosocial support;Therapeutic Exercise;Therapeutic Activities;Patient/family education;DME/adaptive equipment instruction;Disease management/prevention;Stair training;Visual/perceptual remediation/compensation PT Transfers Anticipated Outcome(s): supervision PT Locomotion Anticipated Outcome(s): supervision  PT Recommendation Recommendations for Other Services: Neuropsych consult Follow Up Recommendations: Home health PT Patient destination: Home Equipment Recommended: To be determined  Skilled Therapeutic Intervention Evaluation completed (see details above and below) with education on PT POC and goals and individual treatment initiated with focus on functional mobility, education, encouragement for therapy participation and balance. Pt supine in bed upon PT arrival, pt initially reports "I am not doing therapy, I think I would like to give up." Therapist providing emotional support and education on stroke recovery and quality of life. Pt reports "I have lived a great life, no regrets and I think I am just done." Therapist and pt continued discussing benefits of participation in therapy and stroke prognosis given his current level of function and prior level of function. Pt agreeable to participate. Pt transferred to sitting EOB with min assist and donned briefs, sit<>stand with min assist to pull briefs over hips. Pt performed stand pivot transfer to w/c with min assist and transported to the gym. Therapist provided pt with smaller w/c for improved positioning, stand pivot from  w/c>w/c with min assist. Pt ambulated x 60 ft and x 30 ft this session without AD and mod assist, R LE ataxia noted with increased cues for turns. Pt ascended/descended 4 steps this session with B rails  and step to pattern, min assist. Pt performed car transfer this session with min assist, stand pivot. Pt transported back to room and transferred to bed min assist stand pivot. Pt transferred to supine with supervision and left with needs in reach, bed alarm set.    PT Evaluation Precautions/Restrictions Precautions Precautions: Fall Restrictions Weight Bearing Restrictions: No General   Vital Signs Pain Pain Assessment Pain Scale: 0-10 Pain Score: 0-No pain Home Living/Prior Functioning Home Living Living Arrangements: Spouse/significant other Available Help at Discharge: Family;Available 24 hours/day Type of Home: House Home Access: Stairs to enter [employer] of Steps: 1 Entrance Stairs-Rails: None Home Layout: Two level;Bed/bath upstairs Alternate Level Stairs-Number of Steps: 12, landing then 3 Alternate Level Stairs-Rails: Right;Left  Lives With: Spouse Prior Function Level of Independence: Independent with basic ADLs;Independent with gait;Independent with homemaking with ambulation  Able to Take Stairs?: Yes Driving: Yes Comments: independent, active (enjoys going to the gym)  Cognition Overall Cognitive Status: Within Functional Limits for tasks assessed Orientation Level: Oriented X4 Attention: Sustained Sustained Attention: Appears intact Memory: Appears intact Awareness: Appears intact Safety/Judgment: Appears intact Sensation Sensation Light Touch: Impaired by gross assessment Proprioception: Appears Intact Additional Comments: sensation impaired in R arm, L jaw- pt reports numbness/tingling Coordination Gross Motor Movements are Fluid and Coordinated: No Fine Motor Movements are Fluid and Coordinated: No Coordination and Movement Description:  decreased coordination and accuracy R UE/LE Motor  Motor Motor: Ataxia Motor - Skilled Clinical Observations: mild R hemiparesis, R UE>LE  Mobility Bed Mobility Bed Mobility: Rolling Right;Rolling Left;Supine to Sit;Sit to Supine Rolling Right: Supervision/verbal cueing Rolling Left: Supervision/Verbal cueing Supine to Sit: Supervision/Verbal cueing Sit to Supine: Supervision/Verbal cueing Transfers Transfers: Sit to Stand;Stand to Sit;Stand Pivot Transfers Sit to Stand: Minimal Assistance - Patient > 75% Stand to Sit: Minimal Assistance - Patient > 75% Stand Pivot Transfers: Minimal Assistance - Patient > 75% Stand Pivot Transfer Details: Verbal cues for technique;Verbal cues for precautions/safety Transfer (Assistive device): None Locomotion  Gait Ambulation: Yes Gait Assistance: Moderate Assistance - Patient 50-74% Gait Distance (Feet): 60 Feet Assistive device: None Gait Assistance Details: Verbal cues for technique;Verbal cues for precautions/safety Gait Gait: Yes Gait Pattern: Impaired Gait Pattern: Narrow base of support;Ataxic Gait velocity: decreased Stairs / Additional Locomotion Stairs: Yes Stairs Assistance: Minimal Assistance - Patient > 75% Stair Management Technique: Two rails Number of Stairs: 4 Height of Stairs: 6  Trunk/Postural Assessment  Cervical Assessment Cervical Assessment: Exceptions to WFL(forward head posture) Thoracic Assessment Thoracic Assessment: Exceptions to WFL(rounded shoulders) Lumbar Assessment Lumbar Assessment: Within Functional Limits Postural Control Postural Control: Deficits on evaluation Protective Responses: impaired  Balance Balance Balance Assessed: Yes Static Sitting Balance Static Sitting - Level of Assistance: 5: Stand by assistance Dynamic Sitting Balance Dynamic Sitting - Level of Assistance: 5: Stand by assistance Static Standing Balance Static Standing - Level of Assistance: 4: Min assist Dynamic Standing  Balance Dynamic Standing - Level of Assistance: 4: Min assist;3: Mod assist Extremity Assessment  RLE Assessment RLE Assessment: Within Functional Limits LLE Assessment LLE Assessment: Within Functional Limits    Refer to Care Plan for Long Term Goals  Recommendations for other services: Neuropsych  Discharge Criteria: Patient will be discharged from PT if patient refuses treatment 3 consecutive times without medical reason, if treatment goals not met, if there is a change in medical status, if patient makes no progress towards goals or if patient is discharged from hospital.  The above assessment, treatment plan, treatment alternatives and goals were discussed and  mutually agreed upon: by patient  Netta Corrigan, PT, DPT 12/14/2018, 10:09 AM

## 2018-12-14 NOTE — Progress Notes (Signed)
Danny Frederick PHYSICAL MEDICINE & REHABILITATION PROGRESS NOTE   Subjective/Complaints:  No issues overnite, very discouraged this am per PT, but did better after the session denies SI ROS- neg CP, SOB, N/V/D Objective:   No results found. Recent Labs    12/12/18 0432 12/13/18 0451  WBC 7.8 10.9*  HGB 14.4 14.3  HCT 43.3 42.3  PLT 306 328   Recent Labs    12/12/18 0432 12/13/18 0451  NA 136 134*  K 3.9 3.8  CL 97* 99  CO2 25 22  GLUCOSE 82 110*  BUN 9 15  CREATININE 0.90 1.21  CALCIUM 9.6 9.4    Intake/Output Summary (Last 24 hours) at 12/14/2018 0542 Last data filed at 12/14/2018 0017 Gross per 24 hour  Intake -  Output 150 ml  Net -150 ml     Physical Exam: Vital Signs Blood pressure (!) 186/94, pulse 60, temperature 98.3 F (36.8 C), temperature source Oral, resp. rate 17, SpO2 96 %.  General: No acute distress Mood and affect are appropriate Heart: Regular rate and rhythm no rubs murmurs or extra sounds Lungs: Clear to auscultation, breathing unlabored, no rales or wheezes Abdomen: Positive bowel sounds, soft nontender to palpation, nondistended Extremities: No clubbing, cyanosis, or edema Skin: No evidence of breakdown, no evidence of rash Neurologic: Cranial nerves II through XII intact, motor strength is 5/5 in Left  deltoid, bicep, tricep, grip, hip flexor, knee extensors, ankle dorsiflexor and plantar flexor, 4/5 on right  Sensory exam normal sensation to light touch and proprioception in bilateral upper and lower extremities Cerebellar exam normal left  finger to nose to finger as well as heel to shin in bilateral upper and lower extremities Right side has moderate UE and mild LE dysmetria Musculoskeletal: Full range of motion in all 4 extremities. No joint swelling    Assessment/Plan: 1. Functional deficits secondary to Left Basal ganglia ICH  which require 3+ hours per day of interdisciplinary therapy in a comprehensive inpatient rehab  setting.  Physiatrist is providing close team supervision and 24 hour management of active medical problems listed below.  Physiatrist and rehab team continue to assess barriers to discharge/monitor patient progress toward functional and medical goals  Care Tool:  Bathing              Bathing assist       Upper Body Dressing/Undressing Upper body dressing   What is the patient wearing?: Hospital gown only    Upper body assist Assist Level: Minimal Assistance - Patient > 75%    Lower Body Dressing/Undressing Lower body dressing      What is the patient wearing?: Underwear/pull up     Lower body assist Assist for lower body dressing: Minimal Assistance - Patient > 75%     Toileting Toileting    Toileting assist Assist for toileting: Moderate Assistance - Patient 50 - 74%     Transfers Chair/bed transfer  Transfers assist     Chair/bed transfer assist level: Moderate Assistance - Patient 50 - 74%     Locomotion Ambulation   Ambulation assist              Walk 10 feet activity   Assist           Walk 50 feet activity   Assist           Walk 150 feet activity   Assist           Walk 10 feet on uneven surface  activity  Assist           Wheelchair     Assist               Wheelchair 50 feet with 2 turns activity    Assist            Wheelchair 150 feet activity     Assist          Medical Problem List and Plan: 1.Decreased functional mobilitysecondary to small left basal ganglia ICH likely due to hypertensive crisis -CIR PT, OT evals 2. Antithrombotics: -DVT/anticoagulation:SCDs -antiplatelet therapy: N/A 3. Pain Management:Tylenol as needed 4. Mood:Celexa 20 mg daily -antipsychotic agents: N/A 5. Neuropsych: This patientiscapable of making decisions on hisown behalf. 6. Skin/Wound Care:Routine skin checks 7.  Fluids/Electrolytes/Nutrition:BMET 4/14 borderline low Na+, mild elevation glucose (? Fasting) 8. Hypertension. Avapro 300 mg daily, Lopressor 25 mg twice a day. Monitoras he becomes more active Vitals:   12/13/18 2006 12/14/18 0519  BP: (!) 177/80 (!) 186/94  Pulse: 66 60  Resp: 20 17  Temp: 98.2 F (36.8 C) 98.3 F (36.8 C)  SpO2: 96% 43%  goal of systolic <154, max dose irbesartan, and would not increase metoprolol due to bradycardia,  Add low dose amlodipine 9. Hypothyroidism. Synthroid. Latest TSH 2.080 10.History of ascending aortic aneurysm repair 2015. Follow-up outpatient 11. GERD. Protonix 12. BPH. Monitor voiding pattern  13. CBC borderline hi leukocytes, afeb ,monitor  LOS: 1 days A FACE TO FACE EVALUATION WAS PERFORMED  Charlett Blake 12/14/2018, 5:42 AM

## 2018-12-14 NOTE — Evaluation (Signed)
Speech Language Pathology Assessment and Plan  Patient Details  Name: Danny Frederick MRN: [id number] Date of Birth: June 29, 1934  SLP Diagnosis:   N/A Rehab Potential:   N/A ELOS: N/A    Today's Date: 12/14/2018 SLP Individual Time: 6440-3474 1010-1100 SLP Individual Time Calculation (min): 10 min and 50 min    Problem List:  Patient Active Problem List   Diagnosis Date Noted  . Hyperlipidemia 12/13/2018  . Family hx-stroke 12/13/2018  . ICH (intracerebral hemorrhage) (HCC) - L BG 12/11/2018  . Lip laceration 12/07/2018  . Impaired fasting glucose 12/06/2018  . Spinal stenosis of lumbar region 09/08/2017  . Atherosclerosis of aorta (HCC) 11/08/2015  . Recurrent Clostridium difficile diarrhea   . Bacteremia   . S/P ascending aortic aneurysm repair 01/24/2014  . Ruptured thoracic aortic aneurysm (HCC) 01/23/2014  . 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 Right ventricular dysfunction 01/23/2014  . Solitary pulmonary nodule 12/08/2013  . Chest pain 12/02/2013  . Allergic conjunctivitis 10/20/2012  . Syncope 01/22/2012  . Hypothyroidism 01/22/2011  . Essential hypertension, benign 01/22/2011  . GERD (gastroesophageal reflux disease) 01/22/2011  . Iron deficiency anemia 01/22/2011  . IRON DEFICIENCY ANEMIA SECONDARY TO BLOOD LOSS 11/08/2008  . ANEMIA 05/26/2008  . Essential hypertension 03/02/2008  . DYSPNEA 03/02/2008   Past Medical History:  Past Medical History:  Diagnosis Date  . Allergic conjunctivitis   . Allergic rhinitis, cause unspecified    on allergy shots (Dr. Barnetta Chapel)  . Atherosclerosis of aorta (HCC)    noted on CT angio of abd/pelvis, in many vessels  . BPH (benign prostatic hypertrophy)   . Diverticulosis of colon 12/09  . Foot drop, right   . GERD (gastroesophageal reflux disease)   . Hearing loss in left ear    both ears now  . History of Clostridium difficile    multiple times 2016--s/p fecal transplant (Dr. Drue Second).  Marland Kitchen Hx of  echocardiogram    Echo (03/2014):  Mild LVH, EF 50-55%, no RWMA, Gr 1 DD, mild MR, mild reduced RVSF  . Hypertension   . Internal hemorrhoids   . Iron deficiency anemia, unspecified 6/09  . Mitral valve prolapse    h/o; normal echo 12/2011 with no MVP seen  . 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 Right ventricular dysfunction 01/23/2014   Secondary to obstruction of main pulmonary artery  . Ruptured thoracic aortic aneurysm (HCC) 01/23/2014  . S/P ascending aortic aneurysm repair 01/24/2014  . Unspecified hypothyroidism    Past Surgical History:  Past Surgical History:  Procedure Laterality Date  . CERVICAL LAMINECTOMY  1972   C5-6  . COLONOSCOPY WITH PROPOFOL N/A 11/09/2014   Procedure: COLONOSCOPY WITH PROPOFOL;  Surgeon: Beverley Fiedler, MD;  Location: [facility];  Service: Gastroenterology;  Laterality: N/A;  . ESOPHAGOGASTRODUODENOSCOPY  10/31/08   normal; Dr. Gerilyn Pilgrim  . FECAL TRANSPLANT N/A 11/09/2014   Procedure: FECAL TRANSPLANT;  Surgeon: Beverley Fiedler, MD;  Location: [facility];  Service: Gastroenterology;  Laterality: N/A;  . HEMORRHOIDECTOMY WITH HEMORRHOID BANDING  04/07/13  . hemorroidal banding  04/07/2013   x3-Dr.Eric Andrey Campanile  . PROSTATE SURGERY  2007   photovaporization  . ROTATOR CUFF REPAIR  10/2008   left; Dr. Rennis Chris  . SPINE SURGERY  2006   L4-5 disk surgery  . SPINE SURGERY  04/2013   L4-5, L5-S1 fusion.  Dr. Franky Macho  . THORACIC AORTIC ANEURYSM REPAIR N/A 01/23/2014   Procedure: THORACIC ASCENDING ANEURYSM REPAIR (AAA);  Surgeon: Purcell Nails, MD;  Location:  [facility] OR;  Service: Open Heart Surgery;  Laterality: N/A;  . TONSILLECTOMY      Assessment / Plan / Recommendation Clinical Impression  Danny Frederick is an 85 year old right-handed male with history of hypertension, ascending aortic aneurysm repair 2015, hypertension, BPH. Per chart review lives with spouse. Independent prior to admission and active. 2 level home with bedroom and bathroom  upstairs.Presented 12/11/2018 with unsteady gait,dizziness and numbness of right side.Reports of a fall 3 weeks ago. Blood pressure 220/100 on arrival.cranial CT scan showed acute hemorrhagic stroke hypertensive involving the posterior left basal ganglia no associated midline shift. Troponin negative. Echocardiogram with ejection fraction of 65% normal systolic function. Neurology follow-up ICH felt most likely related to hypertensive episode.Tolerating a regular diet. Therapy evaluations completed with recommendations of physical medicine rehabilitation consult. Patient was admitted for a compress of rehabilitation program.  Pt presents with functional cognitive linguistic skills, supported by Cognistat scores all WFL for pt's age, demonstrating higher level complex problem solving, alternating attention, emergent/anticipatory awareness and recall. Pt stated he will have his daughter help him with medication management initially as well as money management, since his wife is not able to, stating " I think its time." Pt presents with no language nor speech impairment. Pt reported and SLP observed mild limited/effortful left mandibular movement as well as missing upper dentures (broke 1 week prior to CVA), however swallow function is not impacted. SLP downgraded diet to dys 2 per pt request and nursing can upgrade solid advancement at bedside. Pt demonstrated WFL while consuming thin via straw/cup and dys 3 and  dys 2 textures, refuting regular textured trial due to missing dention.  Skilled ST services are not needed at this time.    Skilled Therapeutic Interventions          Skilled ST services focused on cognitive skills. SLP facilitated cognitive linguistic assessment and education of results. SLP provided education of results and allowing assistance from family with medication/money management task to easy back into familiar routine. All questions were answered to satisfaction. Pt was left in room with call  bell within reach and bed alarm set. ST recommends no further ST services at this time.    SLP Assessment  Patient does not need any further Speech Lanaguage Pathology Services    Recommendations  SLP Diet Recommendations: Dysphagia 2 (Fine chop);Thin(nursing upgrade as needed and per pt request) Liquid Administration via: Cup;Straw Medication Administration: Whole meds with liquid Supervision: Patient able to self feed Compensations: Minimize environmental distractions Postural Changes and/or Swallow Maneuvers: Seated upright 90 degrees Oral Care Recommendations: Oral care BID Recommendations for Other Services: Neuropsych consult Patient destination: Home Follow up Recommendations: None Equipment Recommended: None recommended by SLP    SLP Frequency   N/A  SLP Duration  SLP Intensity  SLP Treatment/Interventions N/A   N/A    N/A   Pain Pain Assessment Pain Scale: 0-10 Pain Score: 0-No pain  Prior Functioning Cognitive/Linguistic Baseline: Information not available Type of Home: House  Lives With: Spouse Available Help at Discharge: Family;Available 24 hours/day Vocation: Retired  Teacher, music Term Goals: No short term Runner, broadcasting/film/video to Care Plan for Long Term Goals  Recommendations for other services: Neuropsych  Discharge Criteria: Patient will be discharged from SLP if patient refuses treatment 3 consecutive times without medical reason, if treatment goals not met, if there is a change in medical status, if patient makes no progress towards goals or if patient is discharged from hospital.  The above assessment, treatment plan,  treatment alternatives and goals were discussed and mutually agreed upon: by patient  Danny Frederick  [facility] 12/14/2018, 12:50 PM

## 2018-12-15 ENCOUNTER — Inpatient Hospital Stay (HOSPITAL_COMMUNITY): Payer: Self-pay | Admitting: Occupational Therapy

## 2018-12-15 ENCOUNTER — Encounter (HOSPITAL_COMMUNITY): Payer: Self-pay | Admitting: Psychology

## 2018-12-15 ENCOUNTER — Inpatient Hospital Stay (HOSPITAL_COMMUNITY): Payer: Self-pay | Admitting: Physical Therapy

## 2018-12-15 ENCOUNTER — Inpatient Hospital Stay (HOSPITAL_COMMUNITY): Payer: Self-pay

## 2018-12-15 MED ORDER — LORATADINE 10 MG PO TABS
10.0000 mg | ORAL_TABLET | Freq: Every day | ORAL | Status: DC
  Administered 2018-12-15 – 2018-12-28 (×10): 10 mg via ORAL
  Filled 2018-12-15 (×12): qty 1

## 2018-12-15 NOTE — Progress Notes (Signed)
Occupational Therapy Session Note  Patient Details  Name: Danny Frederick MRN: [id number] Date of Birth: [birth year]  Today's Date: 12/15/2018 OT Individual Time: 5102-5852 OT Individual Time Calculation (min): 72 min    Short Term Goals: Week 1:  OT Short Term Goal 1 (Week 1): Pt will complete all bathing with supervision sit to stand for 3 consecutive sessions.  OT Short Term Goal 2 (Week 1): Pt will complete all dressing with supervision sit to stand for 3 consecutive sessions.  OT Short Term Goal 3 (Week 1): Pt will complete toilet transfers with supervision sit to stand.   OT Short Term Goal 4 (Week 1): Pt will use the RUE at a diminshed level for selfcare tasks with supervision.   Skilled Therapeutic Interventions/Progress Updates:    Pt completed supine to sit EOB with min assist in order to work on self feeding.  Once at the EOB pt sat with close supervision and then worked with use of the RUE to self feed for 95% of session.  Slight ataxia and decreased coordination noted, so red foam grips issued to assist with manipulation of the utensil.  Once he completed self feeding, he was able to donn his shoes with mod assist overall.  He was able to prop the foot up on a stool to tie the right shoe with increased time, but needed max assist to tie the left.  Min assist for stand pivot transfer to the wheelchair was noted to transport out of the room.  He was able to work on standing balance in the therapy gym along with RUE coordination to pick up pegs and place in grid.  Min assist needed for balance as well as for manipulation of larger pegs.  Finished session with return to the room and completion of toilet transfer with min assist.  Nursing was in to assist with toilet hygiene and transfer off of the toilet when he finished.    Therapy Documentation Precautions:  Precautions Precautions: Fall Restrictions Weight Bearing Restrictions: No  Pain: Pain Assessment Pain Scale: Faces Pain  Score: 0-No pain ADL: See Care Tool Section for some details of ADL  Therapy/Group: Individual Therapy  Schuyler Olden OTR/L 12/15/2018, 3:39 PM

## 2018-12-15 NOTE — Progress Notes (Signed)
[facility] PHYSICAL MEDICINE & REHABILITATION PROGRESS NOTE   Subjective/Complaints:  Decent night no c/os this am, no pains ROS- neg CP, SOB, N/V/D Objective:   No results found. Recent Labs    12/13/18 0451 12/14/18 0522  WBC 10.9* 9.6  HGB 14.3 13.9  HCT 42.3 41.7  PLT 328 297   Recent Labs    12/13/18 0451 12/14/18 0522  NA 134* 136  K 3.8 3.6  CL 99 99  CO2 22 24  GLUCOSE 110* 89  BUN 15 16  CREATININE 1.21 1.11  CALCIUM 9.4 9.5    Intake/Output Summary (Last 24 hours) at 12/15/2018 1031 Last data filed at 12/15/2018 0431 Gross per 24 hour  Intake 120 ml  Output 350 ml  Net -230 ml     Physical Exam: Vital Signs Blood pressure (!) 157/90, pulse 60, temperature 97.8 F (36.6 C), resp. rate 19, SpO2 100 %.  General: No acute distress Mood and affect are appropriate Heart: Regular rate and rhythm no rubs murmurs or extra sounds Lungs: Clear to auscultation, breathing unlabored, no rales or wheezes Abdomen: Positive bowel sounds, soft nontender to palpation, nondistended Extremities: No clubbing, cyanosis, or edema Skin: No evidence of breakdown, no evidence of rash Neurologic: Cranial nerves II through XII intact, motor strength is 5/5 in Left  deltoid, bicep, tricep, grip, hip flexor, knee extensors, ankle dorsiflexor and plantar flexor, 4/5 on right  Sensory exam normal sensation to light touch and proprioception in bilateral upper and lower extremities Cerebellar exam normal left  finger to nose to finger as well as heel to shin in bilateral upper and lower extremities Right side has moderate UE and mild LE dysmetria Musculoskeletal: Full range of motion in all 4 extremities. No joint swelling    Assessment/Plan: 1. Functional deficits secondary to Left Basal ganglia ICH  which require 3+ hours per day of interdisciplinary therapy in a comprehensive inpatient rehab setting.  Physiatrist is providing close team supervision and 24 hour management of  active medical problems listed below.  Physiatrist and rehab team continue to assess barriers to discharge/monitor patient progress toward functional and medical goals  Care Tool:  Bathing    Body parts bathed by patient: Right arm, Left arm, Chest, Abdomen, Front perineal area, Buttocks, Left lower leg, Right lower leg, Left upper leg, Right upper leg         Bathing assist Assist Level: Minimal Assistance - Patient > 75%     Upper Body Dressing/Undressing Upper body dressing   What is the patient wearing?: Pull over shirt    Upper body assist Assist Level: Set up assist    Lower Body Dressing/Undressing Lower body dressing      What is the patient wearing?: Pants     Lower body assist Assist for lower body dressing: Minimal Assistance - Patient > 75%     Toileting Toileting    Toileting assist Assist for toileting: Minimal Assistance - Patient > 75%     Transfers Chair/bed transfer  Transfers assist     Chair/bed transfer assist level: Minimal Assistance - Patient > 75%     Locomotion Ambulation   Ambulation assist      Assist level: Moderate Assistance - Patient 50 - 74% Assistive device: Walker-rolling Max distance: 150 ft   Walk 10 feet activity   Assist     Assist level: Moderate Assistance - Patient - 50 - 74% Assistive device: Walker-rolling   Walk 50 feet activity   Assist Walk 50  feet with 2 turns activity did not occur: Safety/medical concerns  Assist level: Moderate Assistance - Patient - 50 - 74% Assistive device: Walker-rolling    Walk 150 feet activity   Assist Walk 150 feet activity did not occur: Safety/medical concerns  Assist level: Moderate Assistance - Patient - 50 - 74% Assistive device: Walker-rolling    Walk 10 feet on uneven surface  activity   Assist Walk 10 feet on uneven surfaces activity did not occur: Safety/medical concerns         Wheelchair     Assist Will patient use wheelchair at  discharge?: No             Wheelchair 50 feet with 2 turns activity    Assist            Wheelchair 150 feet activity     Assist          Medical Problem List and Plan: 1.Decreased functional mobilitysecondary to small left basal ganglia ICH likely due to hypertensive crisis -CIR PT, OT  Team conference today please see physician documentation under team conference tab, met with team face-to-face to discuss problems,progress, and goals. Formulized individual treatment plan based on medical history, underlying problem and comorbidities. 2. Antithrombotics: -DVT/anticoagulation:SCDs -antiplatelet therapy: N/A 3. Pain Management:Tylenol as needed 4. Mood:Celexa 20 mg daily -antipsychotic agents: N/A 5. Neuropsych: This patientiscapable of making decisions on hisown behalf. 6. Skin/Wound Care:Routine skin checks 7. Fluids/Electrolytes/Nutrition:BMET 4/14 borderline low Na+, mild elevation glucose (? Fasting) 8. Hypertension. Avapro 300 mg daily, Lopressor 25 mg twice a day. Monitoras he becomes more active Vitals:   12/14/18 1928 12/15/18 0600  BP: (!) 166/93 (!) 157/90  Pulse: 63 60  Resp: 18 19  Temp: 98.1 F (36.7 C) 97.8 F (36.6 C)  SpO2: 93% 175%  goal of systolic <301, max dose irbesartan, and would not increase metoprolol due to bradycardia,  Add low dose amlodipine today and monitor response 9. Hypothyroidism. Synthroid. Latest TSH 2.080 10.History of ascending aortic aneurysm repair 2015. Follow-up outpatient 11. GERD. Protonix 12. BPH. Monitor voiding pattern  13. CBC borderline hi leukocytes, afeb ,monitor  LOS: 2 days A FACE TO FACE EVALUATION WAS PERFORMED  Charlett Blake 12/15/2018, 10:31 AM

## 2018-12-15 NOTE — Progress Notes (Signed)
Physical Therapy Session Note  Patient Details  Name: Danny Frederick MRN: [id number] Date of Birth: 02/17/1934  Today's Date: 12/15/2018 PT Individual Time: 1037-1130 PT Individual Time Calculation (min): 53 min   Short Term Goals: Week 1:  PT Short Term Goal 1 (Week 1): Pt will ambulate with LRAD x 150 ft CGA PT Short Term Goal 2 (Week 1): Pt will ascend/descend 12 steps with single rail and min assist PT Short Term Goal 3 (Week 1): Pt will complete berg balance test   Skilled Therapeutic Interventions/Progress Updates:    Patient received in bed, sleeping but easily woken. Able to perform functional bed mobility with S today, however initially required ModA for transfer to WC however this faded to MinA with RW throughout session. Able to self-propel WC multiple distances of 100-150ft with B LEs for improved LE strength/endurance, then able to gait train 150ft with RW and MinA for RW management and safety with device. Able to transfer to mat table with MinA/RW, and performed sit to supine with S and worked on hip extensor, hip abductor, and hip flexor strength on the table today. Otherwise performed multiple sit to stand transfers (blocked practice) with max cues for hand placement and sequencing, eccentric controlled lower with UEs reaching back to the table; also incorporated balance training in semi-tandem stance during this activity with no UEs. Transferred back to WC with MinA/RW, self-propelled WC back to his room with S, and required MinA for transfer back to bed. He was left in bed with all needs met, bed alarm active.   Therapy Documentation Precautions:  Precautions Precautions: Fall Restrictions Weight Bearing Restrictions: No General:   Vital Signs:  Pain: Pain Assessment Pain Scale: 0-10 Pain Score: 0-No pain    Therapy/Group: Individual Therapy   Kristen Unger PT, DPT, CBIS  Supplemental Physical Therapist [place]    Pager [phone] Acute Rehab Office  [phone]    12/15/2018, 1:16 PM  

## 2018-12-15 NOTE — Progress Notes (Signed)
Physical Therapy Session Note  Patient Details  Name: Danny Frederick MRN: [id number] Date of Birth: [birth year]  Today's Date: 12/15/2018 PT Individual Time: 9169-4503 PT Individual Time Calculation (min): 72 min   Short Term Goals: Week 1:  PT Short Term Goal 1 (Week 1): Pt will ambulate with LRAD x 150 ft CGA PT Short Term Goal 2 (Week 1): Pt will ascend/descend 12 steps with single rail and min assist PT Short Term Goal 3 (Week 1): Pt will complete berg balance test   Skilled Therapeutic Interventions/Progress Updates:    Pt supine in bed upon PT arrival, agreeable to therapy tx and denies pain. Pt transferred to sitting EOB with supervision to use urinal, continent of bladder. Pt donned briefs, pants, belt and shirt while seated EOB with supervision, min assist for sit<>stand to pull pants over hips. Pt donned hearing aids with set up assist. Pt required assist to don socks and shoes. Pt ambulated to the gym x 150 ft with RW and min-mod assist, increased assist with turns needed, ataxia noted and cues for R heel strike. Pt participated in berg balance test as detailed below, scored 18/56 and discussed results with the pt. Pt worked on dynamic standing balance and coordination this session to performed toe taps to 4 inch step, heel taps with R LE on 4 inch step, and sidestepping, all with min-mod assist without UE support. Pt performed 2 x 10 sit<>stands from mat without UE support working on LE strength, cues for eccentric control with sitting. Therapist performed hamstring stretching this session 2 x 1 min bilaterally. Pt standing on red wedge for heel cord and hamstring stretching x 1 min, increased posterior lean with cues to correct, min assist for balance. Pt transferred to w/c and transported to dayroom. W/c<>nustep with min assist for stand pivot, pt used nustep this session for cardiovascular endurance x 5 minutes on workload 5 using B UEs/LEs. Pt transported back to room, transferred to  toilet stand pivot with min assist and left in care of NT.   Therapy Documentation Precautions:  Precautions Precautions: Fall Restrictions Weight Bearing Restrictions: No  Balance Standardized Balance Assessment Standardized Balance Assessment: Berg Balance Test Berg Balance Test Sit to Stand: Able to stand  independently using hands Standing Unsupported: Able to stand 30 seconds unsupported Sitting with Back Unsupported but Feet Supported on Floor or Stool: Able to sit safely and securely 2 minutes Stand to Sit: Sits independently, has uncontrolled descent Transfers: Needs one person to assist Standing Unsupported with Eyes Closed: Able to stand 10 seconds with supervision Standing Ubsupported with Feet Together: Needs help to attain position but able to stand for 30 seconds with feet together From Standing, Reach Forward with Outstretched Arm: Reaches forward but needs supervision From Standing Position, Pick up Object from Floor: Unable to try/needs assist to keep balance From Standing Position, Turn to Look Behind Over each Shoulder: Turn sideways only but maintains balance Turn 360 Degrees: Needs assistance while turning Standing Unsupported, Alternately Place Feet on Step/Stool: Needs assistance to keep from falling or unable to try Standing Unsupported, One Foot in Front: Loses balance while stepping or standing Standing on One Leg: Unable to try or needs assist to prevent fall Total Score: 18  Therapy/Group: Individual Therapy  Netta Corrigan, PT, DPT 12/15/2018, 7:52 AM

## 2018-12-15 NOTE — Patient Care Conference (Signed)
Inpatient RehabilitationTeam Conference and Plan of Care Update Date: 12/15/2018   Time: 11:15 AM    Patient Name: Danny Frederick      Medical Record Number: [id number]  Date of Birth: 03-May-1934 Sex: Male         Room/Bed: 4W24C/4W24C-01 Payor Info: Payor: MEDICARE / Plan: MEDICARE PART A AND B / Product Type: *No Product type* /    Admitting Diagnosis: cva  Admit Date/Time:  12/13/2018  4:44 PM Admission Comments: No comment available   Primary Diagnosis:  <principal problem not specified> Principal Problem: <principal problem not specified>  Patient Active Problem List   Diagnosis Date Noted  . Hyperlipidemia 12/13/2018  . Family hx-stroke 12/13/2018  . ICH (intracerebral hemorrhage) (HCC) - L BG 12/11/2018  . Lip laceration 12/07/2018  . Impaired fasting glucose 12/06/2018  . Spinal stenosis of lumbar region 09/08/2017  . Atherosclerosis of aorta (HCC) 11/08/2015  . Recurrent Clostridium difficile diarrhea   . Bacteremia   . S/P ascending aortic aneurysm repair 01/24/2014  . Ruptured thoracic aortic aneurysm (HCC) 01/23/2014  . 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 Right ventricular dysfunction 01/23/2014  . Solitary pulmonary nodule 12/08/2013  . Chest pain 12/02/2013  . Allergic conjunctivitis 10/20/2012  . Syncope 01/22/2012  . Hypothyroidism 01/22/2011  . Essential hypertension, benign 01/22/2011  . GERD (gastroesophageal reflux disease) 01/22/2011  . Iron deficiency anemia 01/22/2011  . IRON DEFICIENCY ANEMIA SECONDARY TO BLOOD LOSS 11/08/2008  . ANEMIA 05/26/2008  . Essential hypertension 03/02/2008  . DYSPNEA 03/02/2008    Expected Discharge Date: Expected Discharge Date: 12/28/18  Team Members Present: Physician leading conference: Dr. Claudette Laws Social Worker Present: Dossie Der, LCSW Nurse Present: Gerre Couch, LPN PT Present: Woodfin Ganja, PT OT Present: Perrin Maltese, OT SLP Present: Reuel Derby, SLP PPS Coordinator present :  Fae Pippin     Current Status/Progress Goal Weekly Team Focus  Medical   RIght hemiataxia, elevated BPs, adjustment to disability  Encourage particpation in therapy  manage BPs   Bowel/Bladder   pt continent of bowel and bladder  remain continent of bowel & bladder  assess q shift   Swallow/Nutrition/ Hydration   dys 2 - only due to missing upper dentures and limited/effortful mandibular movement on left, nursing can upgrade  N/A      ADL's   Supervision for UB selfcare with min assist for LB selfcare.  Decreased RUE functional coordination but uses at a diminshed level currently.  modified independent level overall  selfcare retraining, transfer training, balance retraining, neuromuscular re-education, DME education   Mobility   min assist bed mobility and transfers, mod assist for gait 60 ft without AD  supervision  balance, coordination, gait, neuro re-ed   Communication             Safety/Cognition/ Behavioral Observations  WFL  N/A      Pain   no c/o pain  remain pain free  assess q shift   Skin         assess q shift      *See Care Plan and progress notes for long and short-term goals.     Barriers to Discharge  Current Status/Progress Possible Resolutions Date Resolved   Physician    Medical stability     progress toward goals  Maintain mood to participate in therapy      Nursing  Home environment access/layout;Lack of/limited family support               PT  Behavior;Lack of/limited family support;Inaccessible home environment  2 story home, wife with dementia              OT Decreased caregiver support  wife has dementia             SLP                SW Decreased caregiver support Wife has early dementia            Discharge Planning/Teaching Needs:  Home with wife or daughter, may need to be daughter until can reach mod/i level with ambulation, due to wife has dementia. Neuro-psych to see today for coping      Team Discussion:  Goals supervision  with ambulation and mod/i with ADL's. Currently pt is min-mod level. Tends to drag his foot with makes him high risk to fall BERG 18/56. MD working on his elevated BP slowly trying to get it down. Fatigues quickly needs rest breaks. Balance issues. Neuro-psych to see for depression and coping  Revisions to Treatment Plan:  DC 4/28    Continued Need for Acute Rehabilitation Level of Care: The patient requires daily medical management by a physician with specialized training in physical medicine and rehabilitation for the following conditions: Daily direction of a multidisciplinary physical rehabilitation program to ensure safe treatment while eliciting the highest outcome that is of practical value to the patient.: Yes Daily medical management of patient stability for increased activity during participation in an intensive rehabilitation regime.: Yes Daily analysis of laboratory values and/or radiology reports with any subsequent need for medication adjustment of medical intervention for : Neurological problems;Mood/behavior problems   I attest that I was present, lead the team conference, and concur with the assessment and plan of the team. Teleconference held due to COVID 19   Treacy Holcomb, Lemar Livings 12/15/2018, 1:21 PM

## 2018-12-15 NOTE — Progress Notes (Signed)
Social Work Patient ID: Danny Frederick, male   DOB: [birth year], 85 y.o.   MRN: [id number] Met with pt and spoke with daughter-Danny Frederick via telephone to discuss team conference goals supervision-mod/i level and target discharge date 4/28. Daughter will be happy to come in closer to discharge and learn his care. Pt is more upbeat today and can see he has made some progress. He is also seeing neuro-psych today for coping/depression. Will continue to work on discharge needs and provide support to pt.

## 2018-12-16 ENCOUNTER — Inpatient Hospital Stay (HOSPITAL_COMMUNITY): Payer: Self-pay | Admitting: Occupational Therapy

## 2018-12-16 ENCOUNTER — Inpatient Hospital Stay (HOSPITAL_COMMUNITY): Payer: Self-pay | Admitting: Physical Therapy

## 2018-12-16 ENCOUNTER — Inpatient Hospital Stay (HOSPITAL_COMMUNITY): Payer: Self-pay

## 2018-12-16 NOTE — Progress Notes (Signed)
Occupational Therapy Session Note  Patient Details  Name: Danny Frederick MRN: [id number] Date of Birth: [birth year]  Today's Date: 12/16/2018 OT Individual Time: 4097-3532 OT Individual Time Calculation (min): 68 min    Short Term Goals: Week 1:  OT Short Term Goal 1 (Week 1): Pt will complete all bathing with supervision sit to stand for 3 consecutive sessions.  OT Short Term Goal 2 (Week 1): Pt will complete all dressing with supervision sit to stand for 3 consecutive sessions.  OT Short Term Goal 3 (Week 1): Pt will complete toilet transfers with supervision sit to stand.   OT Short Term Goal 4 (Week 1): Pt will use the RUE at a diminshed level for selfcare tasks with supervision.   Skilled Therapeutic Interventions/Progress Updates:    Pt completed bathing and dressing sit to stand at the sink during session.  He needed mod assist for functional mobility around the room without an assistive device to gather towels and washcloths.  Noted short ataxic steps during all movement.  He was able to then sit at the sink and complete UB bathing with supervision as well as LB bathing with min guard assist sit to stand.  Supervision for UB dressing with min guard for LB dressing as well.  He was able to stand with min guard assist for oral hygiene using the RUE.  Ataxic movement noted with RUE use but he is able to incorporate functional use at a diminished level with self care tasks and supervision.  Finished session with ambulation over to the bedside recliner where call button and phone were in reach and chair alarm in place.    Therapy Documentation Precautions:  Precautions Precautions: Fall Restrictions Weight Bearing Restrictions: No  Pain: Pain Assessment Pain Scale: Faces Pain Score: 0-No pain ADL: See Care Tool Section for some details of ADL  Therapy/Group: Individual Therapy  Allah Reason OTR/L 12/16/2018, 12:04 PM

## 2018-12-16 NOTE — IPOC Note (Addendum)
Overall Plan of Care [facility]) Patient Details Name: Danny Frederick MRN: [id number] DOB: Jun 14, 1934  Admitting Diagnosis: <principal problem not specified>  Hospital Problems: Active Problems:   ICH (intracerebral hemorrhage) (HCC) - L BG     Functional Problem List: Nursing Safety, Sensory, Motor  PT Balance, Behavior, Endurance, Motor, Nutrition, Pain, Sensory, Perception, Safety  OT Balance, Endurance, Sensory, Motor  SLP    TR         Basic ADL's: OT Grooming, Bathing, Dressing, Toileting, Eating     Advanced  ADL's: OT Light Housekeeping     Transfers: PT Bed Mobility, Bed to Chair, [employer], Musician, Futures trader, Metallurgist: PT Ambulation, Emergency planning/management officer, Stairs     Additional Impairments: OT Fuctional Use of Upper Extremity  SLP None      TR      Anticipated Outcomes Item Anticipated Outcome  Self Feeding modified independent  Swallowing      Basic self-care  modified independent  Toileting  modified independent   Bathroom Transfers modified independent  Bowel/Bladder  Remains continent x 2  Transfers  supervision  Locomotion  supervision   Communication     Cognition     Pain  Pain rating < level 3  Safety/Judgment  Display safety awareness & uses call light appropriately.   Therapy Plan: PT Intensity: Minimum of 1-2 x/day ,45 to 90 minutes PT Frequency: 5 out of 7 days PT Duration Estimated Length of Stay: 2 weeks OT Intensity: Minimum of 1-2 x/day, 45 to 90 minutes OT Frequency: 5 out of 7 days OT Duration/Estimated Length of Stay: 10-12 days SLP Duration/Estimated Length of Stay: N/A   Due to the current state of emergency, patients may not be receiving their 3-hours of Medicare-mandated therapy.   Team Interventions: Nursing Interventions Patient/Family Education, Pain Management, Medication Management, Discharge Planning  PT interventions Ambulation/gait training, Balance/vestibular training, Cognitive  remediation/compensation, Community reintegration, Discharge planning, Neuromuscular re-education, Splinting/orthotics, UE/LE Coordination activities, UE/LE Strength taining/ROM, Functional mobility training, Functional electrical stimulation, Psychosocial support, Therapeutic Exercise, Therapeutic Activities, Patient/family education, DME/adaptive equipment instruction, Disease management/prevention, Stair training, Visual/perceptual remediation/compensation  OT Interventions Training and development officer, Discharge planning, Community reintegration, Disease mangement/prevention, Neuromuscular re-education, Patient/family education, Self Care/advanced ADL retraining, Therapeutic Exercise, UE/LE Coordination activities, UE/LE Strength taining/ROM, Therapeutic Activities, Functional mobility training, DME/adaptive equipment instruction  SLP Interventions    TR Interventions    SW/CM Interventions Discharge Planning, Psychosocial Support, Patient/Family Education   Barriers to Discharge MD  Medical stability  Nursing Home environment access/layout, Lack of/limited family support    PT Behavior, Lack of/limited family support, Inaccessible home environment 2 story home, wife with dementia  OT Decreased caregiver support wife has dementia  SLP      SW Decreased caregiver support Wife has early dementia   Team Discharge Planning: Destination: PT-Home ,OT- Home , SLP-Home Projected Follow-up: PT-Home health PT, OT-  Home health OT, SLP-None Projected Equipment Needs: PT-To be determined, OT- To be determined, SLP-None recommended by SLP Equipment Details: PT- , OT-  Patient/family involved in discharge planning: PT- Patient,  OT-Patient, SLP-Patient  MD ELOS: 12-14d Medical Rehab Prognosis:  Excellent Assessment:  85 year old right-handed male with history of hypertension, ascending aortic aneurysm repair 2015, hypertension, BPH. Per chart review lives with spouse. Independent prior to admission  and active. 2 level home with bedroom and bathroom upstairs.Presented 12/11/2018 with unsteady gait,dizziness and numbness of right side.Reports of a fall 3 weeks ago. Blood pressure 220/100 on arrival.cranial CT scan showed acute  hemorrhagic stroke hypertensive involving the posterior left basal ganglia no associated midline shift. Troponin negative. Echocardiogram with ejection fraction of 20% normal systolic function. Neurology follow-up [place] felt most likely related to hypertensive episode.Tolerating a regular diet   Now requiring 24/7 Rehab RN,MD, as well as CIR level PT, OT and SLP.  Treatment team will focus on ADLs and mobility with goals set at Mod I/Sup See Team Conference Notes for weekly updates to the plan of care Due to the current state of emergency, patients may not be receiving their 3-hours of Medicare-mandated therapy.

## 2018-12-16 NOTE — Progress Notes (Signed)
Social Work Patient ID: Danny Frederick, male   DOB: [birth year], 85 y.o.   MRN: [id number] Met with pt to inform team conference goals supervision-mod/i level. Target discharge date 4/28. He is pleased with the progress he has made already. He hopes he can be mod/i with ambulation by discharge. He appreciated seeing neuro-psych yesterday it was helpful. Will continue to work on discharge needs.

## 2018-12-16 NOTE — Progress Notes (Signed)
5Physical Therapy Session Note  Patient Details  Name: Danny Frederick MRN: [id number] Date of Birth: 1934-02-19  Today's Date: 12/16/2018 PT Individual Time: 3267-1245 and 1533-1619 PT Individual Time Calculation (min): 70 min and 46 min  Short Term Goals: Week 1:  PT Short Term Goal 1 (Week 1): Pt will ambulate with LRAD x 150 ft CGA PT Short Term Goal 2 (Week 1): Pt will ascend/descend 12 steps with single rail and min assist PT Short Term Goal 3 (Week 1): Pt will complete berg balance test   Skilled Therapeutic Interventions/Progress Updates:   Session 1: Pt received supine in bed and agreeable to therapy session. No report of pain throughout session. Pt requesting to urinate at beginning of session. Performed supine to sit, HOB partially elevated and using bedrails, with close supervision for safety. Pt continent of urine sitting EOB in urinal with supervision for sitting balance. Pt performed UB dressing EOB with supervision and LB dressing with min/mod assist for time management with supervision for sitting balance. Performed sit to stand EOB to RW with min assist for lifting and balance. Pt ambulated ~116ft using RW to therapy gym with min assist for balance - pt demonstrated decreased R LE foot clearance occasionally catching his toes on the floor with a forward flexed posture - cuing for improved foot clearance and upright posture. Pt reports he needs to have BM. Ambulated ~134ft using RW to room with min assist for balance - continues to demonstrate decreased R LE foot clearance and catching toes on floor despite cuing - pt noted to have increased respiratory rate while ambulating. Therapist assisted with LB clothing management for time management. Pt performed stand to sit RW to [facility] over toilet with min assist for eccentric control. Pt continent of BM on toilet. Performed pericare with set-up assistance. Performed sit to stand from [facility] over toilet to RW with min assist for lifting. Pt  transported in w/c to/from therapy gym for time management and energy conservation. Pt performed stand pivot transfer w/c to EOM using RW with min assist for balance and cuing for sequencing and safety. Pt performed sit<>stand from mat throughout session, no AD, with min assist for lifting/balance and cuing for increased anterior weight shift. Pt performed static standing balance in narrow BOS with min assist due to right posterolateral LOB progressed to close guarding for 30 seconds. Pt performed dynamic standing balance cross-body reaching task in shoulder width stance using R UE to place red/yellow clothespins on basketball goal with min/mod assist for balance due to right posterolateral LOB - focus on standing balance and R UE  neuromuscular re-education - cuing for sequencing of task, increased use of R UE, and anterior weight shifting. Pt reported a "fuzzy" feeling gesturing in R parietal region with BP assessed 115/71 (MAP 85) HR 65bpm. Pt performed stand pivot EOM to w/c using RW with min assist for balance. Pt returned to room in w/c, performed stand pivot transfer w/c to EOB using bed rails with min assist for balance. Pt performed sit to supine with supervision. Pt left supine in bed with needs in reach and bed alarm on.   Session 2: Pt received sitting in recliner and agreeable to therapy session. Pt reports his unbalanced symptoms started ~15month prior to hospitalization when he noticed he was catching his heels on the steps when going down - pt also reported that his father had Parkinson's Disease. Pt denies pain during session. Pt performed sit<>stand from recliner, no AD, with CGA/min assist for steadying/balance  due to posterior lean during session. Pt performed alternate B LE foot taps on 4" step, R HHA progressed to no UE support, with min to mod assist due to posterior lean with 3x full posterior LOB requiring max assist to recover - cuing for hip extension to improve anterior weight shift and  upright posture when posterior LOB occurs. Pt ambulated ~23ft x2 , no AD, to/from other end of room with min/mod assist for balance - pt demonstrating posterior lean while ambulating with wide BOS and decreased B LE step length and foot clearance - multimodal cuing for anterior weight shifting and improved step length. Performed hip strategy retraining at wall focusing on increasing hip extension to achieve forward weight shifting and prevent posterior LOB 2 sets of 10 repetitions with pt continuing to require min assist to bring shoulders away from wall. RN present for routine vitals with BP of 100/62 in standing during therapy. Pt performed 2 sets to fatigue (~8-12) of repeated R LE step up/down on 4 " step with R HHA and mod assist for balance progressed to min assist for balance - with increased fatigue pt noted to have increased posterior lean once stepping up requiring cuing for increased hip extension and anterior weight shift. Performed 2 sets to fatigue (~10-12) of repeated L LE step up on 4" step with R HHA and min assist for balance with pt initially demonstrating moderate R LE ataxia with difficulty placing foot on step progressed to minimal ataxia noted. Pt left sitting in recliner, needs in reach, and chair alarm on.   Therapy Documentation Precautions:  Precautions Precautions: Fall Restrictions Weight Bearing Restrictions: No  Therapy/Group: Individual Therapy  Tawana Scale, PT, DPT 12/16/2018, 7:44 AM

## 2018-12-16 NOTE — Progress Notes (Signed)
Physical Therapy Session Note  Patient Details  Name: Danny Frederick MRN: [id number] Date of Birth: 1934/02/20  Today's Date: 12/16/2018 PT Individual Time: 1330-1415 PT Individual Time Calculation (min): 45 min   Short Term Goals: Week 1:  PT Short Term Goal 1 (Week 1): Pt will ambulate with LRAD x 150 ft CGA PT Short Term Goal 2 (Week 1): Pt will ascend/descend 12 steps with single rail and min assist PT Short Term Goal 3 (Week 1): Pt will complete berg balance test   Skilled Therapeutic Interventions/Progress Updates:     Patient in recliner upon PT arrival. Patient alert and agreeable to PT session.  Therapeutic Activity: Transfers: Patient performed sit to/from stand x8 with CGA-min A with a RW. Provided verbal cues for pushing up from the chair and reaching back before sitting.  Gait Training:  Patient ambulated 60 feet x1, RPE 7-8/10 after, and 20 feet x1 using RW with CGA with min A x1 for R posterior LOB. Ambulated with decreased gait speed, decreased step length, increased hip and knee flexion, forward trunk lean, and downward head gaze. Provided verbal cues for erect posture and increased step length.  Neuromuscular Re-ed: Patient performed standing balance progressing from 2 UE support to 1 UE support then no UE support. Stood without UE support with feet apart for 30 seconds x1, 40 seconds x1, and 60 seconds x1, RPE 4-5/10 after each trial. Performed standing balance without UE support with perturbations for 30 seconds x2 with posterior LOB x3 and x4 respectively with A/P perturbation.  Performed side stepping in hallway with B UE support on railing 10' both directions x2 trials with CGA for safety and balance.   Therapeutic Exercise: Patient performed the following exercises with verbal and tactile cues for proper technique. -Seated heel and toe raises x20; attempted in standing and unable to perform with proper technique. Educated on performing seated exercise 3 times per  day.  Patient in recliner at end of session with breaks locked, seat belt alarm set, and all needs within reach. Patient educated on signs and symptoms of stroke and energy conservation with use of 10 point RPE scale.   Therapy Documentation Precautions:  Precautions Precautions: Fall Restrictions Weight Bearing Restrictions: No Pain: Pain Assessment Pain Scale: Faces Pain Score: 0-No pain    Therapy/Group: Individual Therapy  Doreene Burke, PT, DPT 12/16/2018, 3:46 PM

## 2018-12-16 NOTE — Progress Notes (Signed)
[facility] PHYSICAL MEDICINE & REHABILITATION PROGRESS NOTE   Subjective/Complaints:  Patient working hard in therapy.  He has some bladder urgency.  He ambulates with a walker and supervision assistance some min assist as well ROS- neg CP, SOB, N/V/D Objective:   No results found. Recent Labs    12/14/18 0522  WBC 9.6  HGB 13.9  HCT 41.7  PLT 297   Recent Labs    12/14/18 0522  NA 136  K 3.6  CL 99  CO2 24  GLUCOSE 89  BUN 16  CREATININE 1.11  CALCIUM 9.5    Intake/Output Summary (Last 24 hours) at 12/16/2018 1047 Last data filed at 12/16/2018 0833 Gross per 24 hour  Intake 420 ml  Output -  Net 420 ml     Physical Exam: Vital Signs Blood pressure (!) 179/80, pulse (!) 58, temperature 98 F (36.7 C), resp. rate 18, weight 60.2 kg, SpO2 96 %.  General: No acute distress Mood and affect are appropriate Heart: Regular rate and rhythm no rubs murmurs or extra sounds Lungs: Clear to auscultation, breathing unlabored, no rales or wheezes Abdomen: Positive bowel sounds, soft nontender to palpation, nondistended Extremities: No clubbing, cyanosis, or edema Skin: No evidence of breakdown, no evidence of rash Neurologic: Cranial nerves II through XII intact, motor strength is 5/5 in Left  deltoid, bicep, tricep, grip, hip flexor, knee extensors, ankle dorsiflexor and plantar flexor, 4/5 on right  Sensory exam normal sensation to light touch and proprioception in bilateral upper and lower extremities Cerebellar exam normal left  finger to nose to finger as well as heel to shin in bilateral upper and lower extremities Right side has moderate UE and mild LE dysmetria Musculoskeletal: Full range of motion in all 4 extremities. No joint swelling    Assessment/Plan: 1. Functional deficits secondary to Left Basal ganglia ICH  which require 3+ hours per day of interdisciplinary therapy in a comprehensive inpatient rehab setting.  Physiatrist is providing close team  supervision and 24 hour management of active medical problems listed below.  Physiatrist and rehab team continue to assess barriers to discharge/monitor patient progress toward functional and medical goals  Care Tool:  Bathing    Body parts bathed by patient: Right arm, Left arm, Chest, Abdomen, Front perineal area, Buttocks, Left lower leg, Right lower leg, Left upper leg, Right upper leg         Bathing assist Assist Level: Minimal Assistance - Patient > 75%     Upper Body Dressing/Undressing Upper body dressing   What is the patient wearing?: Pull over shirt    Upper body assist Assist Level: Set up assist    Lower Body Dressing/Undressing Lower body dressing      What is the patient wearing?: Pants     Lower body assist Assist for lower body dressing: Minimal Assistance - Patient > 75%     Toileting Toileting    Toileting assist Assist for toileting: Moderate Assistance - Patient 50 - 74%     Transfers Chair/bed transfer  Transfers assist     Chair/bed transfer assist level: Minimal Assistance - Patient > 75%     Locomotion Ambulation   Ambulation assist      Assist level: Minimal Assistance - Patient > 75% Assistive device: Walker-rolling Max distance: 150 ft   Walk 10 feet activity   Assist     Assist level: Minimal Assistance - Patient > 75% Assistive device: Walker-rolling   Walk 50 feet activity   Assist  Walk 50 feet with 2 turns activity did not occur: Safety/medical concerns  Assist level: Minimal Assistance - Patient > 75% Assistive device: Walker-rolling    Walk 150 feet activity   Assist Walk 150 feet activity did not occur: Safety/medical concerns  Assist level: Minimal Assistance - Patient > 75% Assistive device: Walker-rolling    Walk 10 feet on uneven surface  activity   Assist Walk 10 feet on uneven surfaces activity did not occur: Safety/medical concerns         Wheelchair     Assist Will patient  use wheelchair at discharge?: No      Wheelchair assist level: Supervision/Verbal cueing Max wheelchair distance: 137ft     Wheelchair 50 feet with 2 turns activity    Assist        Assist Level: Supervision/Verbal cueing   Wheelchair 150 feet activity     Assist     Assist Level: Supervision/Verbal cueing    Medical Problem List and Plan: 1.Decreased functional mobilitysecondary to small left basal ganglia ICH likely due to hypertensive crisis -CIR PT, OT  Labs reviewed stable 2. Antithrombotics: -DVT/anticoagulation:SCDs -antiplatelet therapy: N/A 3. Pain Management:Tylenol as needed 4. Mood:Celexa 20 mg daily -antipsychotic agents: N/A 5. Neuropsych: This patientiscapable of making decisions on hisown behalf. 6. Skin/Wound Care:Routine skin checks 7. Fluids/Electrolytes/Nutrition:BMET 4/14 borderline low Na+, mild elevation glucose (? Fasting) 8. Hypertension. Avapro 300 mg daily, Lopressor 25 mg twice a day. Monitoras he becomes more active Vitals:   12/15/18 2017 12/16/18 0548  BP: (!) 156/90 (!) 179/80  Pulse: (!) 57 (!) 58  Resp: 19 18  Temp: 98 F (36.7 C)   SpO2: 99% 66%  goal of systolic <440, max dose irbesartan, and would not increase metoprolol due to bradycardia,  Add low dose amlodipine 4/15  and monitor response 9. Hypothyroidism. Synthroid. Latest TSH 2.080 10.History of ascending aortic aneurysm repair 2015. Follow-up outpatient 11. GERD. Protonix 12. BPH. Monitor voiding pattern  13. CBC normalized 4/14,no repeat needed LOS: 3 days A FACE TO [place] E Sueo Cullen 12/16/2018, 10:47 AM

## 2018-12-17 ENCOUNTER — Inpatient Hospital Stay (HOSPITAL_COMMUNITY): Payer: Self-pay | Admitting: Occupational Therapy

## 2018-12-17 ENCOUNTER — Inpatient Hospital Stay (HOSPITAL_COMMUNITY): Payer: Self-pay | Admitting: Physical Therapy

## 2018-12-17 NOTE — Progress Notes (Signed)
Physical Therapy Session Note  Patient Details  Name: Danny Frederick MRN: [id number] Date of Birth: 06-09-1934  Today's Date: 12/17/2018 PT Individual Time: 8299-3716 and 9678-9381 PT Individual Time Calculation (min): 72 min and 45 min  Short Term Goals: Week 1:  PT Short Term Goal 1 (Week 1): Pt will ambulate with LRAD x 150 ft CGA PT Short Term Goal 2 (Week 1): Pt will ascend/descend 12 steps with single rail and min assist PT Short Term Goal 3 (Week 1): Pt will complete berg balance test   Skilled Therapeutic Interventions/Progress Updates:   Session 1: Pt received supine in bed with RN present for scheduled medication administration. Pt performed supine to sit, HOB flat and using bedrails, with close supervision and increased difficulty with trunk upright. Pt performed sit to stand from EOB to RW with min assist for balance. Pt ambulated ~38ft using RW with min assist for balance and 1x instance of mod assist due to posterior LOB when turning - cuing for increased R LE foot clearance, closer AD management, and upright posture. Pt requesting to return to room for BM. Pt performed LB clothing management with mod assist for fine motor tasks for time management and min assist for eccentric control during stand to sit on toilet using grab bars. Pt continent of BM on toilet. Pt performed peri-care with set-up assist and LB clothing management with min assist for balance and increased time for clothing managment. Pt ambulated ~42ft from bathroom to w/c using RW with min assist for balance. Pt transported to gym in w/c. Pt performed stand pivot transfer w/c<>EOM using RW with min assist for balance. Pt performed standing balance and R UE neuromuscular re-education task of standing in narrow BOS while putting together a pipe tree with min cuing for use of R UE and varying from close supervision to mod assist for balance due to R lateral lean. Pt performed dynamic standing balance and R UE neuromuscular  re-education task of throwing horseshoes standing in shoulder width stance with min to mod assist for balance with pt demonstrating right posterolateral lean/LOB requiring intermittent max assist to prevent LOB. Pt performed standing balance with forefoot on foam wedge with initial mod assist to prevent LOB progressed to close supervision for 2 bouts of 30 seconds - mirror provided for visual feedback and manual facilitation for increased anterior weight shifting with increased hip extension and upright stance. Pt ambulated ~133ft from gym to room using RW with min assist for balance and cuing throughout for improved R LE foot clearance, closer AD management, and improved upright posture. Pt left sitting in recliner with needs in reach and chair alarm on.   Session 2: Pt received sitting in recliner and agreeable to therapy session. Pt performed sit<>stand from recliner/chair/mat to RW with CGA for balance throughout session. Pt ambulated ~133ft using RW with min assist for balance to gym - cuing throughout for maintaining close AD management, improved upright stance, and increased B LE foot clearance. Pt ascended/descended 12 steps (6" height) using bilateral handrails with min assist for balance due to slight posterior lean on descent. Pt performed obstacle course navigation ambulating 2x with RW progressed to 2x without UE support around cones in busy environment with CGA for steadying and ~3 instances of min assist for balance - pt demonstrated significantly decreased gait speed with short, shuffled steps throughout with or without AD. Pt ambulated ~51ft using RW with CGA for steadying to mat table. Pt performed dynamic standing balance, cross-body reaching task with  R UE focusing on balance and R UE neuromuscular re-education to match cards - pt performed task with min assist for balance and only 2 instances of mod assist to prevent posterior LOB during reaching from right low to left hight but demonstrates  ~6 right posterolateral LOB when reaching from left low to right high requiring max assist to prevent LOB. Pt ambulated ~79ft using RW with CGA for steadying and cuing for increased  BLE step length and foot clearance with pt demonstrating improved gait mechanics. Pt ambulated ~103ft with R HHA and cuing to maintain same step length and foot clearance with pt requiring slower gait speed and slightly decreased step length with min assist for balance. Pt left sitting in recliner with needs in reach and chair alarm on.   Therapy Documentation Precautions:  Precautions Precautions: Fall Restrictions Weight Bearing Restrictions: No  Pain:  Pt denies pain in both sessions.    Therapy/Group: Individual Therapy  Tawana Scale, PT, DPT 12/17/2018, 7:54 AM

## 2018-12-17 NOTE — Progress Notes (Signed)
Occupational Therapy Session Note  Patient Details  Name: Danny Frederick MRN: [id number] Date of Birth: 1933-11-21  Today's Date: 12/17/2018 OT Individual Time: 1417-1530 OT Individual Time Calculation (min): 73 min    Short Term Goals: Week 1:  OT Short Term Goal 1 (Week 1): Pt will complete all bathing with supervision sit to stand for 3 consecutive sessions.  OT Short Term Goal 2 (Week 1): Pt will complete all dressing with supervision sit to stand for 3 consecutive sessions.  OT Short Term Goal 3 (Week 1): Pt will complete toilet transfers with supervision sit to stand.   OT Short Term Goal 4 (Week 1): Pt will use the RUE at a diminshed level for selfcare tasks with supervision.   Skilled Therapeutic Interventions/Progress Updates:    Pt completed toilet transfer X 2 during session.  First attempt he needed mod assist to complete, ambulating to the bathroom without an assistive device.  Min assist for toilet hygiene and clothing.  On second attempt he was able to ambulate with min assist to the [facility] with use of the RW for support and mod instructional cueing to stay closer to the walker and turn completely square to the [facility] and reach back to sit down.  He was able to complete toilet hygiene again with overall min assist.  Had him ambulate out to the sink and wash his hands with min assist as well.  Noted decreased weightshift to the left for allowance of greater stepping with the RLE when attempting turns to the left.  Next, pt ambulated down to the ortho gym with min assist using the RW.  Once again mod instructional cueing for staying closer to the walker and maintaining upright posture.  Once in the gym he completed 3 sets of 4 mins using the UE ergonometer for RUE strengthening.  All sets were completed on level 5 resistance with Random Program and RPMs maintained around 25 per minute.  BUEs were used secondary to pt having history of right rotator cuff tear.  He demonstrated occasional loss  of grip on the right side and needed min assist to reposition.  Next had pt work in sitting on picking up small 1" pegs and placing them in a peg board with the RUE.  Pt was able to complete but demonstrated 25% drops secondary to decreased FM coordination.  Issued pt green pieces of foam to work on picking up one at a time when he is in his room to further work on [employer] coordination.  Returned to the room via wheelchair at end of session with pt transferring to the bedside recliner and call button and phone in reach and chair alarm in place.    Therapy Documentation Precautions:  Precautions Precautions: Fall Restrictions Weight Bearing Restrictions: No  Pain: Pain Assessment Pain Scale: Faces Pain Score: 0-No pain ADL: See Care Tool Section for some details of ADL  Therapy/Group: Individual Therapy  Raine Blodgett OTR/L 12/17/2018, 4:10 PM

## 2018-12-17 NOTE — Progress Notes (Signed)
[place] PHYSICAL MEDICINE & REHABILITATION PROGRESS NOTE   Subjective/Complaints: Patient has been working with physical therapy this morning.  Ambulates with walker standby assistance short step length forward flexed posture. Has suture inside of upper lip since fall.  Reviewed ED visit note from 11/24/2018 as well as PCP note from 12/01/2018 patient had multiple facial lacerations as well as the inner lip area.  All of the sutures were removed except for the inner lip which was an absorbable chromic gut.  ROS- neg CP, SOB, N/V/D Objective:   No results found. No results for input(s): WBC, HGB, HCT, PLT in the last 72 hours. No results for input(s): NA, K, CL, CO2, GLUCOSE, BUN, CREATININE, CALCIUM in the last 72 hours.  Intake/Output Summary (Last 24 hours) at 12/17/2018 0904 Last data filed at 12/17/2018 0514 Gross per 24 hour  Intake 120 ml  Output 200 ml  Net -80 ml     Physical Exam: Vital Signs Blood pressure (!) 154/81, pulse (!) 55, temperature 98.4 F (36.9 C), temperature source Oral, resp. rate 18, weight 60.2 kg, SpO2 99 %.  General: No acute distress Mood and affect are appropriate Heart: Regular rate and rhythm no rubs murmurs or extra sounds Lungs: Clear to auscultation, breathing unlabored, no rales or wheezes Abdomen: Positive bowel sounds, soft nontender to palpation, nondistended Extremities: No clubbing, cyanosis, or edema Skin: No evidence of breakdown, no evidence of rash Neurologic: Cranial nerves II through XII intact, motor strength is 5/5 in Left  deltoid, bicep, tricep, grip, hip flexor, knee extensors, ankle dorsiflexor and plantar flexor, 4/5 on right  Sensory exam normal sensation to light touch and proprioception in bilateral upper and lower extremities Cerebellar exam normal left  finger to nose to finger as well as heel to shin in bilateral upper and lower extremities Right side has moderate UE and mild LE dysmetria Musculoskeletal: Full range  of motion in all 4 extremities. No joint swelling    Assessment/Plan: 1. Functional deficits secondary to Left Basal ganglia ICH  which require 3+ hours per day of interdisciplinary therapy in a comprehensive inpatient rehab setting.  Physiatrist is providing close team supervision and 24 hour management of active medical problems listed below.  Physiatrist and rehab team continue to assess barriers to discharge/monitor patient progress toward functional and medical goals  Care Tool:  Bathing    Body parts bathed by patient: Right arm, Left arm, Chest, Abdomen, Front perineal area, Buttocks, Left lower leg, Right lower leg, Left upper leg, Right upper leg         Bathing assist Assist Level: Minimal Assistance - Patient > 75%     Upper Body Dressing/Undressing Upper body dressing   What is the patient wearing?: Pull over shirt    Upper body assist Assist Level: Set up assist    Lower Body Dressing/Undressing Lower body dressing      What is the patient wearing?: Pants     Lower body assist Assist for lower body dressing: Minimal Assistance - Patient > 75%     Toileting Toileting    Toileting assist Assist for toileting: Moderate Assistance - Patient 50 - 74%     Transfers Chair/bed transfer  Transfers assist     Chair/bed transfer assist level: Minimal Assistance - Patient > 75%     Locomotion Ambulation   Ambulation assist      Assist level: Moderate Assistance - Patient 50 - 74% Assistive device: No Device Max distance: 60'   Walk 10  feet activity   Assist     Assist level: Moderate Assistance - Patient - 50 - 74% Assistive device: No Device   Walk 50 feet activity   Assist Walk 50 feet with 2 turns activity did not occur: Safety/medical concerns  Assist level: Minimal Assistance - Patient > 75% Assistive device: Walker-rolling    Walk 150 feet activity   Assist Walk 150 feet activity did not occur: Safety/medical  concerns  Assist level: Minimal Assistance - Patient > 75% Assistive device: Walker-rolling    Walk 10 feet on uneven surface  activity   Assist Walk 10 feet on uneven surfaces activity did not occur: Safety/medical concerns         Wheelchair     Assist Will patient use wheelchair at discharge?: No      Wheelchair assist level: Supervision/Verbal cueing Max wheelchair distance: 162ft     Wheelchair 50 feet with 2 turns activity    Assist        Assist Level: Supervision/Verbal cueing   Wheelchair 150 feet activity     Assist     Assist Level: Supervision/Verbal cueing    Medical Problem List and Plan: 1.Decreased functional mobilitysecondary to small left basal ganglia ICH likely due to hypertensive crisis -CIR PT, OT  Mainly with ataxia 2. Antithrombotics: -DVT/anticoagulation:SCDs -antiplatelet therapy: N/A 3. Pain Management:Tylenol as needed 4. Mood:Celexa 20 mg daily -antipsychotic agents: N/A 5. Neuropsych: This patientiscapable of making decisions on hisown behalf. 6. Skin/Wound Care:Routine skin checks, upper lip Chromic Gut suture intact.  The laceration is well-healed, this will eventually reabsorb, if this causes discomfort for the patient certainly can be removed.  He is 3 weeks status post laceration repair 7. Fluids/Electrolytes/Nutrition:BMET 4/14 borderline low Na+, mild elevation glucose (? Fasting) 8. Hypertension. Avapro 300 mg daily, Lopressor 25 mg twice a day. Monitoras he becomes more active Vitals:   12/16/18 2030 12/17/18 0608  BP: (!) 165/72 (!) 154/81  Pulse: 67 (!) 55  Resp:  18  Temp: (!) 97.2 F (36.2 C) 98.4 F (36.9 C)  SpO2: 95% 78%  goal of systolic <242, max dose irbesartan, and would not increase metoprolol due to bradycardia,  Add low dose amlodipine 4/15  and monitor response, if running consistently above 160 may increase to 5mg  9. Hypothyroidism.  Synthroid. Latest TSH 2.080 10.History of ascending aortic aneurysm repair 2015. Follow-up outpatient 11. GERD. Protonix 12. BPH. Monitor voiding pattern  13. CBC normalized 4/14,no repeat needed LOS: 4 days A FACE TO FACE EVALUATION WAS PERFORMED  Charlett Blake 12/17/2018, 9:04 AM

## 2018-12-18 ENCOUNTER — Inpatient Hospital Stay (HOSPITAL_COMMUNITY): Payer: Self-pay | Admitting: Occupational Therapy

## 2018-12-18 ENCOUNTER — Inpatient Hospital Stay (HOSPITAL_COMMUNITY): Payer: Self-pay | Admitting: Physical Therapy

## 2018-12-18 MED ORDER — AMLODIPINE BESYLATE 5 MG PO TABS
5.0000 mg | ORAL_TABLET | Freq: Every day | ORAL | Status: DC
  Administered 2018-12-19 – 2018-12-21 (×3): 5 mg via ORAL
  Filled 2018-12-18 (×3): qty 1

## 2018-12-18 MED ORDER — FLUTICASONE PROPIONATE 50 MCG/ACT NA SUSP
1.0000 | Freq: Every day | NASAL | Status: DC
  Administered 2018-12-19 – 2018-12-28 (×10): 1 via NASAL
  Filled 2018-12-18: qty 16

## 2018-12-18 NOTE — Progress Notes (Signed)
Occupational Therapy Session Note  Patient Details  Name: Danny Frederick MRN: [id number] Date of Birth: 1933-12-03  Today's Date: 12/18/2018 OT Individual Time: 1015-1100 OT Individual Time Calculation (min): 45 min    Short Term Goals: Week 1:  OT Short Term Goal 1 (Week 1): Pt will complete all bathing with supervision sit to stand for 3 consecutive sessions.  OT Short Term Goal 2 (Week 1): Pt will complete all dressing with supervision sit to stand for 3 consecutive sessions.  OT Short Term Goal 3 (Week 1): Pt will complete toilet transfers with supervision sit to stand.   OT Short Term Goal 4 (Week 1): Pt will use the RUE at a diminshed level for selfcare tasks with supervision.   Skilled Therapeutic Interventions/Progress Updates:    Patient seated in recliner and ready for therapy session.  He requests a sponge bath and change of clothes.  SPT to/from recliner, w/c with CG A using RW.  Short distance ambulation in room with RW CGA.  He completed sponge bath seated in w/c at sink with set up and occ incidental assistance.  UB dressing with set up, LB dressing min A.  Grooming CG A in stance. Patient completed standing balance activities with CGA   2 reps for approx 3 minutes each - fatigue noted with each activity.  He returned to recliner at close of session, seat alarm set and call bell/tray table in reach.  Therapy Documentation Precautions:  Precautions Precautions: Fall Restrictions Weight Bearing Restrictions: No General:   Vital Signs:  Pain: Pain Assessment Pain Scale: 0-10 Pain Score: 0-No pain Faces Pain Scale: Hurts a little bit Pain Type: Chronic pain Pain Location: Generalized Pain Descriptors / Indicators: Aching Pain Onset: On-going Patients Stated Pain Goal: 1 Pain Intervention(s): Medication (See eMAR) Multiple Pain Sites: No PAINAD (Pain Assessment in Advanced Dementia) Breathing: normal Negative Vocalization: none Facial Expression: smiling or  inexpressive Body Language: relaxed Consolability: no need to console PAINAD Score: 0   Praxis   Exercises:   Other Treatments:     Therapy/Group: Individual Therapy  Carlos Levering 12/18/2018, 12:21 PM

## 2018-12-18 NOTE — Progress Notes (Signed)
[place] PHYSICAL MEDICINE & REHABILITATION PROGRESS NOTE   Subjective/Complaints: No new issues. Up with PT. Denies pain. Slept well.    ROS: Patient denies fever, rash, sore throat, blurred vision, nausea, vomiting, diarrhea, cough, shortness of breath or chest pain, joint or back pain, headache, or mood change.    Objective:   No results found. No results for input(s): WBC, HGB, HCT, PLT in the last 72 hours. No results for input(s): NA, K, CL, CO2, GLUCOSE, BUN, CREATININE, CALCIUM in the last 72 hours.  Intake/Output Summary (Last 24 hours) at 12/18/2018 1113 Last data filed at 12/18/2018 0900 Gross per 24 hour  Intake 720 ml  Output 300 ml  Net 420 ml     Physical Exam: Vital Signs Blood pressure (!) 163/82, pulse (!) 54, temperature 97.9 F (36.6 C), temperature source Oral, resp. rate 16, height 5\' 4"  (1.626 m), weight 60.2 kg, SpO2 96 %.  Constitutional: No distress . Vital signs reviewed. HEENT: EOMI, oral membranes moist Neck: supple Cardiovascular: RRR without murmur. No JVD    Respiratory: CTA Bilaterally without wheezes or rales. Normal effort    GI: BS +, non-tender, non-distended  Extremities: No clubbing, cyanosis, or edema Skin: No evidence of breakdown, no evidence of rash Neurologic: Cranial nerves II through XII intact, motor strength is 5/5 in Left  deltoid, bicep, tricep, grip, hip flexor, knee extensors, ankle dorsiflexor and plantar flexor, 4/5 on right  Sensory exam normal sensation to light touch and proprioception in bilateral upper and lower extremities Cerebellar exam normal left  finger to nose to finger as well as heel to shin in bilateral upper and lower extremities Right side has moderate UE and mild LE dysmetria, tends to not fully shift weight to right leg during stance phase of gait Musculoskeletal: full ROM , no pain    Assessment/Plan: 1. Functional deficits secondary to Left Basal ganglia ICH  which require 3+ hours per day of  interdisciplinary therapy in a comprehensive inpatient rehab setting.  Physiatrist is providing close team supervision and 24 hour management of active medical problems listed below.  Physiatrist and rehab team continue to assess barriers to discharge/monitor patient progress toward functional and medical goals  Care Tool:  Bathing    Body parts bathed by patient: Right arm, Left arm, Chest, Abdomen, Front perineal area, Buttocks, Left lower leg, Right lower leg, Left upper leg, Right upper leg         Bathing assist Assist Level: Minimal Assistance - Patient > 75%     Upper Body Dressing/Undressing Upper body dressing   What is the patient wearing?: Pull over shirt    Upper body assist Assist Level: Set up assist    Lower Body Dressing/Undressing Lower body dressing      What is the patient wearing?: Pants     Lower body assist Assist for lower body dressing: Minimal Assistance - Patient > 75%     Toileting Toileting    Toileting assist Assist for toileting: Minimal Assistance - Patient > 75%     Transfers Chair/bed transfer  Transfers assist     Chair/bed transfer assist level: Minimal Assistance - Patient > 75%     Locomotion Ambulation   Ambulation assist      Assist level: Minimal Assistance - Patient > 75% Assistive device: Walker-rolling Max distance: 127ft   Walk 10 feet activity   Assist     Assist level: Minimal Assistance - Patient > 75% Assistive device: Walker-rolling   Walk 50  feet activity   Assist Walk 50 feet with 2 turns activity did not occur: Safety/medical concerns  Assist level: Minimal Assistance - Patient > 75% Assistive device: Walker-rolling    Walk 150 feet activity   Assist Walk 150 feet activity did not occur: Safety/medical concerns  Assist level: Minimal Assistance - Patient > 75% Assistive device: Walker-rolling    Walk 10 feet on uneven surface  activity   Assist Walk 10 feet on uneven  surfaces activity did not occur: Safety/medical concerns         Wheelchair     Assist Will patient use wheelchair at discharge?: No      Wheelchair assist level: Supervision/Verbal cueing Max wheelchair distance: 155ft     Wheelchair 50 feet with 2 turns activity    Assist        Assist Level: Supervision/Verbal cueing   Wheelchair 150 feet activity     Assist     Assist Level: Supervision/Verbal cueing    Medical Problem List and Plan: 1.Decreased functional mobilitysecondary to small left basal ganglia ICH likely due to hypertensive crisis -CIR PT, OT   Mainly with ataxia 2. Antithrombotics: -DVT/anticoagulation:SCDs -antiplatelet therapy: N/A 3. Pain Management:Tylenol as needed 4. Mood:Celexa 20 mg daily -antipsychotic agents: N/A 5. Neuropsych: This patientiscapable of making decisions on hisown behalf. 6. Skin/Wound Care:Routine skin checks,   -upper lip Chromic Gut suture intact.  The laceration is well-healed, this will eventually reabsorb, if this causes discomfort for the patient certainly can be removed.  He is 3 weeks status post laceration repair 7. Fluids/Electrolytes/Nutrition:BMET 4/14 borderline low Na+, mild elevation glucose (? Fasting) 8. Hypertension. Avapro 300 mg daily, Lopressor 25 mg twice a day. Monitoras he becomes more active Vitals:   12/17/18 1940 12/18/18 0326  BP: (!) 161/78 (!) 163/82  Pulse: (!) 57 (!) 54  Resp: 16 16  Temp: (!) 97.3 F (36.3 C) 97.9 F (36.6 C)  SpO2: 95% 48%  goal of systolic <250, max dose irbesartan, and would not increase metoprolol due to bradycardia,  Added low dose amlodipine 4/15 , increase to 5mg  9. Hypothyroidism. Synthroid. Latest TSH 2.080 10.History of ascending aortic aneurysm repair 2015. Follow-up outpatient 11. GERD. Protonix 12. BPH. Monitor voiding pattern  13. CBC normalized 4/14,no repeat needed   LOS: 5 days A FACE TO  [place] 12/18/2018, 11:13 AM

## 2018-12-18 NOTE — Progress Notes (Signed)
Physical Therapy Session Note  Patient Details  Name: Danny Frederick MRN: [id number] Date of Birth: 1934-02-29  Today's Date: 12/18/2018 PT Individual Time: 0258-5277 and 8242-3536 PT Individual Time Calculation (min): 90 min and 65 min  Short Term Goals: Week 1:  PT Short Term Goal 1 (Week 1): Pt will ambulate with LRAD x 150 ft CGA PT Short Term Goal 2 (Week 1): Pt will ascend/descend 12 steps with single rail and min assist PT Short Term Goal 3 (Week 1): Pt will complete berg balance test   Skilled Therapeutic Interventions/Progress Updates:   Session 1: Pt received supine in bed and agreeable to therapy session. Nursing staff present for eye drop administration. MD in/out. Pt denies pain during therapy session. Pt performed supine to sit, HOB partially elevated and using bedrails, with supervision for safety. Pt performed seated LB clothing with min assist for time management. Pt performed sit<>stand from EOB/EOM to RW/no AD with CGA/min assist for balance throughout session. Pt ambulated ~131ft using RW with CGA for steadying - cuing for increased step length and foot clearance with pt continuing to occasionally catch R toes on the floor. Pt performed dynamic standing balance, cross-body reaching task focusing on standing balance and R UE neuromuscular re-education to place clothespins on basketball goal - progressed to narrow BOS with pt able to perform low right to high left reaching with CGA throughout and 1 instance of min assist due to posterior LOB - performed low left to high right reaching with CGA and 2x mod assist and 2x min assist due to posterior LOB. Pt ambulated ~36ft x2, no AD, to/from stairs with min assist for balance - cuing for increased step length and increased arm swing. Pt ascended/descended 12 (6") steps using bilateral handrails with min assist for balance - pt ascended with reciprocal steps and descended with step-to pattern - cuing for foot placement on steps. Performed  alternate B LE foot taps on 4" step focusing on dynamic standing balance with primarily CGA for steadying - attempted pacing steps to 40bpm metronome with pt difficulty motor planning/timing to tap on beat and unable to perform steps at that speed - with verbal cuing pt able to increase speed. Pt performed repeated R LE step-up on 4" step, no UE support, with primarily CGA and intermittent min assist due to posterior lean with verbal/tactile cuing for hip extension and anterior weight shifting to improve balance. Pt performed repeated L LE step up on 4" step, no UE support, with primarily CGA and 2x mod assist due to posterior lean - cuing for increased hip extension, upright posture, and anterior weight shift with pt demonstrating improved auto-correction requiring less cuing. Pt performed repeated L lateral step-up on 4" step, no UE support, with primarily mod assist for balance due to right and posterior LOB - cuing for improved upright posture and L weight shift onto LE when stepping up. Performed repeated R LE lateral step-up on 4" step, no UE support, with primarily min assist/CGA for steadying - cuing for increased R weight shift and R hip/knee extension to step up. Pt ambulated ~59ft, no UE support, with CGA for steadying and cuing for increased step length. Pt performed ball toss to trampoline with feet in shoulder width stance with CGA and intermittent min assist for balance. Pt ambulated ~11ft holding ball with B UEs with min assist for balance. Pt ambulated ~172ft using RW with CGA for steadying and cuing for increased B LE step length, upright posture, and closer AD management -  pt noted to have decreased step length, foot clearance, and increased forward trunk flexion with fatigue.   Session 2: Pt received supine in bed and agreeable to therapy session. Pt denied pain during session. Pt performed supine to sit, HOB flat and no bedrails, with supervision. Pt performed sit<>stand from EOB/EOM to RW and  to no AD with CGA/close supervision throughout session. Pt ambulated ~110ft using RW with CGA for steadying and demonstrating improving B LE step length with continued decreased R LE foot clearance with fatigue. Pt ambulated ~48ft carrying ball to/from trampoline with min assist for balance. Pt tossed ball in trampoline with feet in shoulder width stance progressed to narrow BOS all with CGA and 1 instance of min assist due to pt missing when catching the ball. Pt performed cone taps starting with alternating LEs with pt noted to have significantly increased difficulty tapping with L LE requiring mod assist to prevent L LOB - performed repeated L LE cone tap focusing on cuing for increased upright posture with hip extension and R lateral weight shifting with pt progressing to performing with CGA - returned to performing alternate LE cone taps with pt continuing to demonstrate difficulty and LOB when tapping with L LE requiring intermittent mod assist for LOB. Performed lateral side stepping at hallway rail without UE support and min assist/CGA throughout for balance/steadying - pt demonstrates increased difficulty stepping R with decreased R lateral weightshifting to bring L foot to the middle - manual facilitation and multimodal cuing for increased R weight shift. Pt performed supine<>sit on mat table with supervision. Performed supine bridges, R LE preference with L LE extended, 3 sets to fatigue (~15-20reps). Performed repeated sit<>stand no UE support with R LE preference, L LE extended, 2 sets of 10 reps with cuing for increased hip/knee extension and trunk upright. Performed standing balance activity on airex with feet shoulder width apart and R UE neuromuscular re-education task with PEG board performing 2 moderate difficulty patterns - pt required CGA for balance with occasional min assist due to posterior lean with cuing for hip extension and anterior weight shifting - pt able to perform with R UE with  increased time. Pt ambulated ~79ft using RW with CGA for steadying and cuing for R LE foot clearance when turning. Ambulated ~7ft, no AD, with min assist for balance and cuing for increased upright posture, B LE step length and R LE foot clearance with pt demonstrating increased gait impairments with fatigue requiring mod assist towards end of ambulation. Pt left sitting in recliner with needs in reach and chair alarm on.  Therapy Documentation Precautions:  Precautions Precautions: Fall Restrictions Weight Bearing Restrictions: No  Therapy/Group: Individual Therapy  Tawana Scale, PT, DPT  12/18/2018, 7:58 AM

## 2018-12-19 ENCOUNTER — Inpatient Hospital Stay (HOSPITAL_COMMUNITY): Payer: Self-pay

## 2018-12-19 NOTE — Plan of Care (Signed)
  Problem: Consults Goal: RH STROKE PATIENT EDUCATION Description See Patient Education module for education specifics  Outcome: Progressing   Problem: RH BOWEL ELIMINATION Goal: RH STG MANAGE BOWEL WITH ASSISTANCE Description STG Manage Bowel with Mod I Assistance.  Outcome: Progressing   Problem: RH BLADDER ELIMINATION Goal: RH STG MANAGE BLADDER WITH EQUIPMENT WITH ASSISTANCE Description STG Manage Bladder With Equipment With Supervision Assistance  Outcome: Progressing   Problem: RH SKIN INTEGRITY Goal: RH STG SKIN FREE OF INFECTION/BREAKDOWN Description Mod i  Outcome: Progressing   Problem: RH SAFETY Goal: RH STG ADHERE TO SAFETY PRECAUTIONS W/ASSISTANCE/DEVICE Description STG Adhere to Safety Precautions With Mod I Assistance/Device.  Outcome: Progressing   Problem: RH PAIN MANAGEMENT Goal: RH STG PAIN MANAGED AT OR BELOW PT'S PAIN GOAL Description <2  Outcome: Progressing

## 2018-12-19 NOTE — Progress Notes (Signed)
Physical Therapy Session Note  Patient Details  Name: Danny Frederick MRN: [id number] Date of Birth: Jul 21, 1934  Today's Date: 12/19/2018 PT Individual Time: 2820-6015 PT Individual Time Calculation (min): 55 min   Short Term Goals: Week 1:  PT Short Term Goal 1 (Week 1): Pt will ambulate with LRAD x 150 ft CGA PT Short Term Goal 2 (Week 1): Pt will ascend/descend 12 steps with single rail and min assist PT Short Term Goal 3 (Week 1): Pt will complete berg balance test   Skilled Therapeutic Interventions/Progress Updates:    Pt seated in recliner upon PT arrival, agreeable to therapy tx and denies pain. Pt donned shoes with min assist. Pt ambulated x 150 ft to the dayroom with RW and min assist, cues for increased step length and RW management. PT transferred to nustep and used nustep this session x 6 minutes for cardiovascular endurance and global strengthening, resistance level 5. Pt ambulated to the gym x 150 ft with RW and min assist. Pt performed 2 x 10 sit<>stands from mat without UE support for LE strengthening. Pt worked on dynamic standing balance this session while standing on airex tossing horseshoes x 2 trials and while standing on red wedge completing card matching activity x 2 trials, CGA-min assist for balance with cues to correct posterior lean. Pt worked on dynamic standing balance and coordination to perform toe taps to colored dots on 4 inch step, min assist for balance without UE support. Pt worked on standing balance and UE coordination to perform peg board puzzle while standing. Pt transferred to supine and performed LE strengthening exercises including 2 x 10 each: bridges, supine marches, sidelying hip abduction. Pt transferred to sitting with supervision and ambulated back to room with RW and min assist. Pt left in recliner with needs in reach and chair alarm set.   Therapy Documentation Precautions:  Precautions Precautions: Fall Restrictions Weight Bearing  Restrictions: No    Therapy/Group: Individual Therapy  Netta Corrigan, PT, DPT 12/19/2018, 7:58 AM

## 2018-12-19 NOTE — Progress Notes (Signed)
[place] PHYSICAL MEDICINE & REHABILITATION PROGRESS NOTE   Subjective/Complaints: Had a good night. Denies any new problems  ROS: Patient denies fever, rash, sore throat, blurred vision, nausea, vomiting, diarrhea, cough, shortness of breath or chest pain, joint or back pain, headache, or mood change.     Objective:   No results found. No results for input(s): WBC, HGB, HCT, PLT in the last 72 hours. No results for input(s): NA, K, CL, CO2, GLUCOSE, BUN, CREATININE, CALCIUM in the last 72 hours.  Intake/Output Summary (Last 24 hours) at 12/19/2018 1059 Last data filed at 12/19/2018 0900 Gross per 24 hour  Intake 660 ml  Output 275 ml  Net 385 ml     Physical Exam: Vital Signs Blood pressure (!) 156/84, pulse 60, temperature 98 F (36.7 C), temperature source Oral, resp. rate 18, height 5\' 4"  (1.626 m), weight 60.2 kg, SpO2 98 %.  Constitutional: No distress . Vital signs reviewed. HEENT: EOMI, oral membranes moist Neck: supple Cardiovascular: RRR without murmur. No JVD    Respiratory: CTA Bilaterally without wheezes or rales. Normal effort    GI: BS +, non-tender, non-distended   Extremities: No clubbing, cyanosis, or edema Skin: No evidence of breakdown, no evidence of rash Neurologic: Cranial nerves II through XII intact, motor strength is 5/5 in Left  deltoid, bicep, tricep, grip, hip flexor, knee extensors, ankle dorsiflexor and plantar flexor, 4/5 on right  Sensory exam normal sensation to light touch and proprioception in bilateral upper and lower extremities Cerebellar exam normal left  finger to nose to finger as well as heel to shin in bilateral upper and lower extremities Right side has moderate UE and mild LE dysmetria with FTN. Musculoskeletal: full ROM , no pain Psych: pleasant   Assessment/Plan: 1. Functional deficits secondary to Left Basal ganglia ICH  which require 3+ hours per day of interdisciplinary therapy in a comprehensive inpatient rehab  setting.  Physiatrist is providing close team supervision and 24 hour management of active medical problems listed below.  Physiatrist and rehab team continue to assess barriers to discharge/monitor patient progress toward functional and medical goals  Care Tool:  Bathing    Body parts bathed by patient: Right arm, Left arm, Chest, Abdomen, Front perineal area, Buttocks, Left lower leg, Right lower leg, Left upper leg, Right upper leg, Face         Bathing assist Assist Level: Minimal Assistance - Patient > 75%     Upper Body Dressing/Undressing Upper body dressing   What is the patient wearing?: Pull over shirt    Upper body assist Assist Level: Set up assist    Lower Body Dressing/Undressing Lower body dressing      What is the patient wearing?: Pants, Underwear/pull up     Lower body assist Assist for lower body dressing: Minimal Assistance - Patient > 75%     Toileting Toileting    Toileting assist Assist for toileting: Minimal Assistance - Patient > 75%     Transfers Chair/bed transfer  Transfers assist     Chair/bed transfer assist level: Minimal Assistance - Patient > 75%     Locomotion Ambulation   Ambulation assist      Assist level: Minimal Assistance - Patient > 75% Assistive device: Walker-rolling Max distance: 140ft   Walk 10 feet activity   Assist     Assist level: Contact Guard/Touching assist Assistive device: Walker-rolling   Walk 50 feet activity   Assist Walk 50 feet with 2 turns activity did not  occur: Safety/medical concerns  Assist level: Minimal Assistance - Patient > 75% Assistive device: Walker-rolling    Walk 150 feet activity   Assist Walk 150 feet activity did not occur: Safety/medical concerns  Assist level: Minimal Assistance - Patient > 75% Assistive device: Walker-rolling    Walk 10 feet on uneven surface  activity   Assist Walk 10 feet on uneven surfaces activity did not occur: Safety/medical  concerns         Wheelchair     Assist Will patient use wheelchair at discharge?: No      Wheelchair assist level: Supervision/Verbal cueing Max wheelchair distance: 128ft     Wheelchair 50 feet with 2 turns activity    Assist        Assist Level: Supervision/Verbal cueing   Wheelchair 150 feet activity     Assist     Assist Level: Supervision/Verbal cueing    Medical Problem List and Plan: 1.Decreased functional mobilitysecondary to small left basal ganglia ICH likely due to hypertensive crisis -CIR PT, OT  2. Antithrombotics: -DVT/anticoagulation:SCDs -antiplatelet therapy: N/A 3. Pain Management:Tylenol as needed 4. Mood:Celexa 20 mg daily -antipsychotic agents: N/A 5. Neuropsych: This patientiscapable of making decisions on hisown behalf. 6. Skin/Wound Care:Routine skin checks,   -upper lip Chromic Gut suture intact.  The laceration is well-healed, this will eventually reabsorb, if this causes discomfort for the patient certainly can be removed.  He is 3 weeks status post laceration repair 7. Fluids/Electrolytes/Nutrition:BMET 4/14 borderline low Na+, mild elevation glucose (? Fasting) 8. Hypertension. Avapro 300 mg daily, Lopressor 25 mg twice a day. Monitoras he becomes more active Vitals:   12/19/18 0509 12/19/18 0620  BP: (!) 194/90 (!) 156/84  Pulse:    Resp:    Temp:    SpO2:    goal of systolic <093, max dose irbesartan, and would not increase metoprolol due to bradycardia,    -Added low dose amlodipine 4/15 , increased to 5mg   On 4/18   -follow for trend 9. Hypothyroidism. Synthroid. Latest TSH 2.080 10.History of ascending aortic aneurysm repair 2015. Follow-up outpatient 11. GERD. Protonix 12. BPH. Monitor voiding pattern  13. CBC normalized 4/14,no repeat needed   LOS: 6 days A FACE TO [place] 12/19/2018, 10:59 AM

## 2018-12-20 ENCOUNTER — Inpatient Hospital Stay (HOSPITAL_COMMUNITY): Payer: Self-pay

## 2018-12-20 ENCOUNTER — Inpatient Hospital Stay (HOSPITAL_COMMUNITY): Payer: Self-pay | Admitting: Occupational Therapy

## 2018-12-20 NOTE — Progress Notes (Signed)
Physical Therapy Session Note  Patient Details  Name: Danny Frederick MRN: [id number] Date of Birth: 1934-02-04  Today's Date: 12/20/2018 PT Individual Time: 1105-1150 PT Individual Time Calculation (min): 45 min   Short Term Goals: Week 1:  PT Short Term Goal 1 (Week 1): Pt will ambulate with LRAD x 150 ft CGA PT Short Term Goal 2 (Week 1): Pt will ascend/descend 12 steps with single rail and min assist PT Short Term Goal 3 (Week 1): Pt will complete berg balance test   Skilled Therapeutic Interventions/Progress Updates:    Patient seated in room in recliner.  Reports already did walking this am.  Performed sit<>stand x 5 with progressing 2 hand A to 1 hand with CGA.  Patient performed standing heel raises, squats, at sink hip abduction, hamstring curls x 10 each.  Standing lateral and and/post weight shifts with min UE support.  Standing with back on wall for wall bumps for weight shifts/balance.  Gait with RW to general gym with min A for balance, weight shift at times sliding R foot on floor or lifting too high.  Patient ambulated 100' with SPC and min A for sequencing and safety with improved R foot clearance.  Patient reports doesn't feel as if cane helps him much.  Performed gait x 80' with QC and min A after demonstration with cues for slowing down and assist for sequencing, etc.  Patient fatigued and requested to toilet.  Ambulated to room with RW and min A.  Assisted to toilet and pt sat with S.  Pt. Left in bathroom with NT in room to take over and assist.    Therapy Documentation Precautions:  Precautions Precautions: Fall Restrictions Weight Bearing Restrictions: No Pain: Pain Assessment Pain Score: 0-No pain    Therapy/Group: Individual Therapy  Reginia Naas  [place], PT  12/20/2018, 1:21 PM

## 2018-12-20 NOTE — Progress Notes (Signed)
Physical Therapy Session Note  Patient Details  Name: Danny Frederick MRN: [id number] Date of Birth: November 23, 1933  Today's Date: 12/20/2018 PT Individual Time: [phone] and 1515-1600 PT Individual Time Calculation (min): 69 min and 45 min    Short Term Goals: Week 1:  PT Short Term Goal 1 (Week 1): Pt will ambulate with LRAD x 150 ft CGA PT Short Term Goal 2 (Week 1): Pt will ascend/descend 12 steps with single rail and min assist PT Short Term Goal 3 (Week 1): Pt will complete berg balance test   Skilled Therapeutic Interventions/Progress Updates:    Session 1: Pt seated in recliner upon PT arrival, agreeable to therapy tx and denies pain. Pt performed sit<>stand with RW and CGA, ambulated into the bathroom with RW and CGA x 12 ft. Pt transferred to toilet with RW and CGA. Pt ambulated to the sink with RW and min assist, washed hands. Pt ambulated to the gym x 150 ft with RW and min assist, cues for RW management, upright posture and increased step length. Pt worked on dynamic standing balance without AD this session including the following: forwards ambulation, backwards ambulation, sidestepping, gait with eyes closed, standing on red wedge tossing horseshoes, toe taps to colored cones, all with min-mod assist for balance. Pt and therapist discussed d/c planning, home modifications and fall risk, therapist providing education throughout. Pt ambulated back to the room at the end of session x 150 ft with min assist overall, x 1 occasion where pt required mod assist for correction of LOB. Pt left in recliner at end of session with needs in reach and chair alarm set.   Session 2: Pt seated in recliner upon PT arrival, agreeable to therapy tx and denies pain.  Pt ambulated to the gym x 150 ft with RW and min assist, cues for RW management, upright posture and increased step length. Pt worked on dynamic balance and gait while ambulating without AD x 60 ft, pt with increased LOB and shuffling this  session, reports feeling more tired this afternoon. Pt transported to dayroom and used nustep x 6 minutes on workload 5 for cardiovascular endurance and activity tolerance. PT ambulated to mat with RW and min assist x 40 ft. Pt worked on dynamic standing balance and balance strategies this session while standing on airex with therapist applying manual perturbations, also standing on airex with eyes closed. Pt worked on Actuary with posterior LOB, pt with difficulty taking single step back to catch balance and instead demonstrates multiple small steps backwards to catch posterior LOB, cues for techniques. Pt transported back to room and ambulated back to bed, left in recliner with needs in reach.      Therapy Documentation Precautions:  Precautions Precautions: Fall Restrictions Weight Bearing Restrictions: No   Therapy/Group: Individual Therapy  Netta Corrigan, PT, DPT 12/20/2018, 7:57 AM

## 2018-12-20 NOTE — Progress Notes (Signed)
Occupational Therapy Session Note  Patient Details  Name: Danny Frederick MRN: [id number] Date of Birth: Dec 24, 1933  Today's Date: 12/20/2018 OT Individual Time: 2094-7096 OT Individual Time Calculation (min): 70 min    Short Term Goals: Week 1:  OT Short Term Goal 1 (Week 1): Pt will complete all bathing with supervision sit to stand for 3 consecutive sessions.  OT Short Term Goal 2 (Week 1): Pt will complete all dressing with supervision sit to stand for 3 consecutive sessions.  OT Short Term Goal 3 (Week 1): Pt will complete toilet transfers with supervision sit to stand.   OT Short Term Goal 4 (Week 1): Pt will use the RUE at a diminshed level for selfcare tasks with supervision.   Skilled Therapeutic Interventions/Progress Updates:    Treatment session with focus on ADL retraining, functional mobility, and functional use of dominant RUE.  Pt received upright in recliner agreeable to bathing/dressing.  Pt ambulated to sink with RW with CGA, obtaining clothing from dresser with cues from therapist for improved positioning with RW to increase safety when reaching outside BOS.  Pt completed bathing and dressing with CGA when standing.  Pt required increased time when putting in hearing aid on Rt side due to decreased coordination of RUE.  Pt ambulated back to recliner without AD with min assist and mod cues for posture as pt quite unsteady on feet and reaching outside BOS for furniture to steady self.  Engaged in self-feeding of lunch with cues to incorporate use of dominant RUE.  Pt utilized RUE approx 40% of meal without dropping any items.  Utilized RUE at diminished level when bringing cup to mouth, utilizing BUE for stability.  Engaged in [place] with focus on fine motor control when stacking blocks, stringing beads, and buttoning/unbuttoning buttons.  Pt required increased time and cues to visually attend to RUE to increase motor control.  Pt remained upright in recliner with seat alarm on and all  needs in reach.  Therapy Documentation Precautions:  Precautions Precautions: Fall Restrictions Weight Bearing Restrictions: No Pain: Pain Assessment Pain Score: 0-No pain   Therapy/Group: Individual Therapy  Simonne Come 12/20/2018, 3:14 PM

## 2018-12-20 NOTE — Progress Notes (Signed)
[facility] PHYSICAL MEDICINE & REHABILITATION PROGRESS NOTE   Subjective/Complaints:  No issues overnite, amb with PT, uses walker with CGA  ROS: Patient denies CP, SOB, N/V/D Objective:   No results found. No results for input(s): WBC, HGB, HCT, PLT in the last 72 hours. No results for input(s): NA, K, CL, CO2, GLUCOSE, BUN, CREATININE, CALCIUM in the last 72 hours.  Intake/Output Summary (Last 24 hours) at 12/20/2018 0846 Last data filed at 12/20/2018 0415 Gross per 24 hour  Intake 720 ml  Output 150 ml  Net 570 ml     Physical Exam: Vital Signs Blood pressure (!) 160/85, pulse 60, temperature (!) 97.5 F (36.4 C), temperature source Oral, resp. rate 16, height 5\' 4"  (1.626 m), weight 60.2 kg, SpO2 97 %.  Constitutional: No distress . Vital signs reviewed. HEENT: EOMI, oral membranes moist Neck: supple Cardiovascular: RRR without murmur. No JVD    Respiratory: CTA Bilaterally without wheezes or rales. Normal effort    GI: BS +, non-tender, non-distended   Extremities: No clubbing, cyanosis, or edema Skin: No evidence of breakdown, no evidence of rash Neurologic: Cranial nerves II through XII intact, motor strength is 5/5 in Left  deltoid, bicep, tricep, grip, hip flexor, knee extensors, ankle dorsiflexor and plantar flexor, 4/5 on right  Sensory exam normal sensation to light touch and proprioception in bilateral upper and lower extremities Cerebellar exam normal left  finger to nose to finger as well as heel to shin in bilateral upper and lower extremities Right side has moderate UE  with FTN. Musculoskeletal: full ROM , no pain Psych: pleasant   Assessment/Plan: 1. Functional deficits secondary to Left Basal ganglia ICH  which require 3+ hours per day of interdisciplinary therapy in a comprehensive inpatient rehab setting.  Physiatrist is providing close team supervision and 24 hour management of active medical problems listed below.  Physiatrist and rehab team  continue to assess barriers to discharge/monitor patient progress toward functional and medical goals  Care Tool:  Bathing    Body parts bathed by patient: Right arm, Left arm, Chest, Abdomen, Front perineal area, Buttocks, Left lower leg, Right lower leg, Left upper leg, Right upper leg, Face         Bathing assist Assist Level: Minimal Assistance - Patient > 75%     Upper Body Dressing/Undressing Upper body dressing   What is the patient wearing?: Pull over shirt    Upper body assist Assist Level: Set up assist    Lower Body Dressing/Undressing Lower body dressing      What is the patient wearing?: Pants, Underwear/pull up     Lower body assist Assist for lower body dressing: Minimal Assistance - Patient > 75%     Toileting Toileting    Toileting assist Assist for toileting: Minimal Assistance - Patient > 75%     Transfers Chair/bed transfer  Transfers assist     Chair/bed transfer assist level: Minimal Assistance - Patient > 75%     Locomotion Ambulation   Ambulation assist      Assist level: Minimal Assistance - Patient > 75% Assistive device: Walker-rolling Max distance: 160ft   Walk 10 feet activity   Assist     Assist level: Minimal Assistance - Patient > 75% Assistive device: Walker-rolling   Walk 50 feet activity   Assist Walk 50 feet with 2 turns activity did not occur: Safety/medical concerns  Assist level: Minimal Assistance - Patient > 75% Assistive device: Walker-rolling    Walk 150 feet  activity   Assist Walk 150 feet activity did not occur: Safety/medical concerns  Assist level: Minimal Assistance - Patient > 75% Assistive device: Walker-rolling    Walk 10 feet on uneven surface  activity   Assist Walk 10 feet on uneven surfaces activity did not occur: Safety/medical concerns         Wheelchair     Assist Will patient use wheelchair at discharge?: No      Wheelchair assist level: Supervision/Verbal  cueing Max wheelchair distance: 113ft     Wheelchair 50 feet with 2 turns activity    Assist        Assist Level: Supervision/Verbal cueing   Wheelchair 150 feet activity     Assist     Assist Level: Supervision/Verbal cueing    Medical Problem List and Plan: 1.Decreased functional mobilitysecondary to small left basal ganglia ICH likely due to hypertensive crisis -CIR PT, OT , ELOS 4/28 2. Antithrombotics: -DVT/anticoagulation:SCDs -antiplatelet therapy: N/A 3. Pain Management:Tylenol as needed 4. Mood:Celexa 20 mg daily -antipsychotic agents: N/A 5. Neuropsych: This patientiscapable of making decisions on hisown behalf. 6. Skin/Wound Care:Routine skin checks,   -upper lip Chromic Gut suture intact.  The laceration is well-healed, this will eventually reabsorb, if this causes discomfort for the patient certainly can be removed.  He is 3 weeks status post laceration repair 7. Fluids/Electrolytes/Nutrition:BMET 4/14 borderline low Na+, mild elevation glucose (? Fasting) 8. Hypertension. Avapro 300 mg daily, Lopressor 25 mg twice a day. Monitoras he becomes more active Vitals:   12/19/18 1954 12/20/18 0534  BP: 137/67 (!) 160/85  Pulse: (!) 59 60  Resp:  16  Temp: 97.7 F (36.5 C) (!) 97.5 F (36.4 C)  SpO2: 98% 75%  goal of systolic <449, max dose irbesartan, and would not increase metoprolol due to bradycardia,    -Added low dose amlodipine 4/15 , increased to 5mg   On 4/18   -follow for trend, elevation this am just prior to med admin 9. Hypothyroidism. Synthroid. Latest TSH 2.080 10.History of ascending aortic aneurysm repair 2015. Follow-up outpatient 11. GERD. Protonix 12. BPH. Monitor voiding pattern  13. CBC normalized 4/14,no repeat needed   LOS: 7 days A FACE TO FACE EVALUATION WAS PERFORMED  Charlett Blake 12/20/2018, 8:46 AM

## 2018-12-21 ENCOUNTER — Inpatient Hospital Stay (HOSPITAL_COMMUNITY): Payer: Self-pay | Admitting: Physical Therapy

## 2018-12-21 ENCOUNTER — Inpatient Hospital Stay (HOSPITAL_COMMUNITY): Payer: Self-pay

## 2018-12-21 ENCOUNTER — Inpatient Hospital Stay (HOSPITAL_COMMUNITY): Payer: Self-pay | Admitting: Occupational Therapy

## 2018-12-21 MED ORDER — SENNOSIDES-DOCUSATE SODIUM 8.6-50 MG PO TABS: 1.0000 | ORAL_TABLET | Freq: Every evening | ORAL | Status: DC | PRN

## 2018-12-21 MED ORDER — AMLODIPINE BESYLATE 10 MG PO TABS
10.0000 mg | ORAL_TABLET | Freq: Every day | ORAL | Status: DC
  Administered 2018-12-22 – 2018-12-28 (×7): 10 mg via ORAL
  Filled 2018-12-21 (×7): qty 1

## 2018-12-21 NOTE — Progress Notes (Signed)
Occupational Therapy Session Note  Patient Details  Name: Danny Frederick MRN: [id number] Date of Birth: 02-01-1934  Today's Date: 12/21/2018 OT Individual Time: 9242-6834 OT Individual Time Calculation (min): 76 min    Short Term Goals: Week 1:  OT Short Term Goal 1 (Week 1): Pt will complete all bathing with supervision sit to stand for 3 consecutive sessions.  OT Short Term Goal 2 (Week 1): Pt will complete all dressing with supervision sit to stand for 3 consecutive sessions.  OT Short Term Goal 3 (Week 1): Pt will complete toilet transfers with supervision sit to stand.   OT Short Term Goal 4 (Week 1): Pt will use the RUE at a diminshed level for selfcare tasks with supervision.   Skilled Therapeutic Interventions/Progress Updates:    Pt started session with ambulation over to the sink with min guard assist using the RW for support and then brushing his teeth.  Once completed, he rested briefly in a chair and then ambulated down to the therapy gym.  Once in the gym he transferred onto the mat in quadriped for work on [employer] bearing tasks.  Had pt work on washing the mat with the right and left hands while supporting with the other.  Also had him work on transitions from prone to quadriped with min guard assist.  Next had pt ambulate to the dayroom with min guard assist and complete 3 sets on the Nustep with use of the UEs only.  He was able to complete the first set for 5 mins with resistance on level 5 using BUEs and averaging 44 steps per min.  The last 2 sets were completed for 2 mins each with just isolated use of the RUE only.  Average number of steps in the low 20s.  Next, therapist took him to use the [employer] in standing.  He was able to complete 2 sets of 2 mins with use of the LUE only.  Adjustments made as pt has history of right rotator cuff injury.  He was able to complete in an average of 6 seconds between lights on the first set and 3.5 mins on the second.  Finished session  with ambulation back to the room with min guard assist.  Pt left in the bedside recliner with safety alarm in place and call button and phone in reach.    Therapy Documentation Precautions:  Precautions Precautions: Fall Restrictions Weight Bearing Restrictions: No  Pain: Pain Assessment Pain Score: 0-No pain ADL: See Care Tool Section for some details of ADL  Therapy/Group: Individual Therapy  Desare Duddy OTR/L 12/21/2018, 11:56 AM

## 2018-12-21 NOTE — Progress Notes (Signed)
Physical Therapy Session Note  Patient Details  Name: Danny Frederick MRN: [id number] Date of Birth: [birth year]  Today's Date: 12/21/2018 PT Individual Time: 1405-1502 PT Individual Time Calculation (min): 57 min   Short Term Goals: Week 1:  PT Short Term Goal 1 (Week 1): Pt will ambulate with LRAD x 150 ft CGA PT Short Term Goal 2 (Week 1): Pt will ascend/descend 12 steps with single rail and min assist PT Short Term Goal 3 (Week 1): Pt will complete berg balance test   Skilled Therapeutic Interventions/Progress Updates:   Pt received sitting in recliner and agreeable to therapy session. Reports needing to have BM. Ambulates ~90ft to bathroom using RW with close supervision/CGA for steadying. Pt performed sit<>stand transfer to/from toilet using grab bars with CGA. Pt continent of BM on toilet with therapist assisting for peri-care to ensure cleanliness. Pt ambulated ~273ft using RW with CGA for steadying demonstrating increased B LE step length and improved R LE foot clearance during ambulation - cuing throughout for improved upright posture and closer AD management. Pt started 4-square stepping towards external target with min assist for balance and then suddenly reports he is having an incontinent BM in his pants. Pt transported back to room in w/c and performed stand pivot transfer w/c to toilet using grab bars with CGA for steadying. Pt noted to be incontinent of loose stool in pants. Therapist performed peri-care for cleanliness and assisted pt with donning new LB clothing. Pt also had continent BM on toilet making a total of 3 BMs during therapy session. Pt performed stand pivot transfer toilet to w/c, using grab bars, with CGA for steadying. Pt transported to/from therapy gym in w/c for time management. Pt participated in 4 square stepping towards external target balance task, without UE support, and min assist for balance with pt demonstrating increased difficulty stepping posteriorly with R  LE. Pt performed ladder forward stepping progressing from both feet in each square to 1 foot in each square requiring mod assist for balance. Pt transported back to room in w/c performing stand pivot transfer w/c to EOB with supervision for safety. Performed sit to supine with supervision and left supine in bed with needs in reach and bed alarm on.   Therapy Documentation Precautions:  Precautions Precautions: Fall Restrictions Weight Bearing Restrictions: No  Pain: Denies pain during session.   Therapy/Group: Individual Therapy  Tawana Scale, PT, DPT 12/21/2018, 2:49 PM

## 2018-12-21 NOTE — Progress Notes (Signed)
[facility] PHYSICAL MEDICINE & REHABILITATION PROGRESS NOTE   Subjective/Complaints:    ROS: Patient denies CP, SOB, N/V/D Objective:   No results found. No results for input(s): WBC, HGB, HCT, PLT in the last 72 hours. No results for input(s): NA, K, CL, CO2, GLUCOSE, BUN, CREATININE, CALCIUM in the last 72 hours.  Intake/Output Summary (Last 24 hours) at 12/21/2018 1105 Last data filed at 12/21/2018 0859 Gross per 24 hour  Intake 472 ml  Output 125 ml  Net 347 ml     Physical Exam: Vital Signs Blood pressure (!) 171/91, pulse (!) 55, temperature 97.7 F (36.5 C), resp. rate 17, height 5\' 4"  (1.626 m), weight 60.2 kg, SpO2 98 %.  Constitutional: No distress . Vital signs reviewed. HEENT: EOMI, oral membranes moist Neck: supple Cardiovascular: RRR without murmur. No JVD    Respiratory: CTA Bilaterally without wheezes or rales. Normal effort    GI: BS +, non-tender, non-distended   Extremities: No clubbing, cyanosis, or edema Skin: No evidence of breakdown, no evidence of rash Neurologic: Cranial nerves II through XII intact, motor strength is 5/5 in Left  deltoid, bicep, tricep, grip, hip flexor, knee extensors, ankle dorsiflexor and plantar flexor, 4/5 on right  Sensory exam normal sensation to light touch and proprioception in bilateral upper and lower extremities Cerebellar exam normal left  finger to nose to finger as well as heel to shin in bilateral upper and lower extremities Right side has moderate UE  with FTN. Musculoskeletal: full ROM , no pain Psych: pleasant   Assessment/Plan: 1. Functional deficits secondary to Left Basal ganglia ICH  which require 3+ hours per day of interdisciplinary therapy in a comprehensive inpatient rehab setting.  Physiatrist is providing close team supervision and 24 hour management of active medical problems listed below.  Physiatrist and rehab team continue to assess barriers to discharge/monitor patient progress toward  functional and medical goals  Care Tool:  Bathing    Body parts bathed by patient: Right arm, Left arm, Chest, Abdomen, Front perineal area, Buttocks, Left lower leg, Right lower leg, Left upper leg, Right upper leg, Face         Bathing assist Assist Level: Contact Guard/Touching assist     Upper Body Dressing/Undressing Upper body dressing   What is the patient wearing?: Pull over shirt    Upper body assist Assist Level: Set up assist    Lower Body Dressing/Undressing Lower body dressing      What is the patient wearing?: Pants, Underwear/pull up     Lower body assist Assist for lower body dressing: Minimal Assistance - Patient > 75%     Toileting Toileting    Toileting assist Assist for toileting: Supervision/Verbal cueing     Transfers Chair/bed transfer  Transfers assist     Chair/bed transfer assist level: Contact Guard/Touching assist     Locomotion Ambulation   Ambulation assist      Assist level: Contact Guard/Touching assist Assistive device: Walker-rolling Max distance: 150 ft   Walk 10 feet activity   Assist     Assist level: Minimal Assistance - Patient > 75% Assistive device: Walker-rolling   Walk 50 feet activity   Assist Walk 50 feet with 2 turns activity did not occur: Safety/medical concerns  Assist level: Minimal Assistance - Patient > 75% Assistive device: Walker-rolling    Walk 150 feet activity   Assist Walk 150 feet activity did not occur: Safety/medical concerns  Assist level: Minimal Assistance - Patient > 75% Assistive device:  Walker-rolling    Walk 10 feet on uneven surface  activity   Assist Walk 10 feet on uneven surfaces activity did not occur: Safety/medical concerns         Wheelchair     Assist Will patient use wheelchair at discharge?: No      Wheelchair assist level: Supervision/Verbal cueing Max wheelchair distance: 145ft     Wheelchair 50 feet with 2 turns  activity    Assist        Assist Level: Supervision/Verbal cueing   Wheelchair 150 feet activity     Assist     Assist Level: Supervision/Verbal cueing    Medical Problem List and Plan: 1.Decreased functional mobilitysecondary to small left basal ganglia ICH likely due to hypertensive crisis -CIR PT, OT , ELOS 4/28 Team conference in a.m. 2. Antithrombotics: -DVT/anticoagulation:SCDs -antiplatelet therapy: N/A 3. Pain Management:Tylenol as needed 4. Mood:Celexa 20 mg daily -antipsychotic agents: N/A 5. Neuropsych: This patientiscapable of making decisions on hisown behalf. 6. Skin/Wound Care:Routine skin checks,   -upper lip Chromic Gut suture intact.  The laceration is well-healed, this will eventually reabsorb, if this causes discomfort for the patient certainly can be removed.  He is 3 weeks status post laceration repair 7. Fluids/Electrolytes/Nutrition:BMET 4/14 borderline low Na+, mild elevation glucose (? Fasting) 8. Hypertension. Avapro 300 mg daily, Lopressor 25 mg twice a day. Monitoras he becomes more active Vitals:   12/20/18 1955 12/21/18 0531  BP: (!) 163/73 (!) 171/91  Pulse: 67 (!) 55  Resp: 20 17  Temp: (!) 97.5 F (36.4 C) 97.7 F (36.5 C)  SpO2: 99% 88%  goal of systolic <828, max dose irbesartan, and would not increase metoprolol due to bradycardia,    -Added low dose amlodipine 4/15 , increased to 5mg   On 4/18, will increase again 4/22   -follow for trend, elevation this am just prior to med admin 9. Hypothyroidism. Synthroid. Latest TSH 2.080 10.History of ascending aortic aneurysm repair 2015. Follow-up outpatient 11. GERD. Protonix 12. BPH. Monitor voiding pattern  13. CBC normalized 4/14,no repeat needed 14.  Patient reported bowel incontinence with loose stools, reviewed bowel pattern, the patient does have loose stools at least on a daily basis but no incontinence reported.  Will change  Senokot to as needed  LOS: 8 days A FACE TO FACE EVALUATION WAS PERFORMED  Charlett Blake 12/21/2018, 11:05 AM

## 2018-12-21 NOTE — Progress Notes (Signed)
Physical Therapy Session Note  Patient Details  Name: Danny Frederick MRN: [id number] Date of Birth: November 13, 1933  Today's Date: 12/21/2018 PT Individual Time: 1115-1228 PT Individual Time Calculation (min): 73 min   Short Term Goals: Week 1:  PT Short Term Goal 1 (Week 1): Pt will ambulate with LRAD x 150 ft CGA PT Short Term Goal 2 (Week 1): Pt will ascend/descend 12 steps with single rail and min assist PT Short Term Goal 3 (Week 1): Pt will complete berg balance test   Skilled Therapeutic Interventions/Progress Updates:    Pt seated in recliner upon PT arrival, agreeable to therapy tx and denies pain. Pt transferred to standing with RW and supervision, ambulated to gym with CGA x 200 ft and RW. Pt worked on dynamic standing balance while ambulating forwards/backwards without AD this session, min assist. Pt with increased shuffling/LOB during turns. Pt transferred to quadruped with min assist, performed x 5 hip extensions per leg for hip and core strengthening. Pt transferred to tall kneeling and maintained position while therapist performed manual perturbations working on balance and postural control. Pt transferred to supine and performed 2 x 10 bridges and alternating hip flexion 2 x 10. Therapist performed hamstring stretching and hip flexor stretching this session, education on how to perform these stretches at home. Pt ambulated to the steps with min assist and ascended/descended 12 steps with min assist using B rails, pt reports he has B rails at home. Pt ambulated from gym<>rehab apartment with RW and CGA 2 x 65 ft. Pt worked on household ambulation and furniture transfers with CGA and RW, on/off the couch and recliners with verbal cues for upright posture and RW management. Pt worked on dynamic standing balance without UE support to perform toe taps to colored cones, min assist. Pt ambulated back to room with RW and CGA, transferred to toilet and performed toileting tasks with supervision,  ambulated to recliner and left with needs in reach and chair alarm set.   Therapy Documentation Precautions:  Precautions Precautions: Fall Restrictions Weight Bearing Restrictions: No    Therapy/Group: Individual Therapy  Netta Corrigan, PT, DPT 12/21/2018, 10:42 AM

## 2018-12-22 ENCOUNTER — Inpatient Hospital Stay (HOSPITAL_COMMUNITY): Payer: Self-pay

## 2018-12-22 ENCOUNTER — Inpatient Hospital Stay (HOSPITAL_COMMUNITY): Payer: Self-pay | Admitting: Occupational Therapy

## 2018-12-22 NOTE — Patient Care Conference (Signed)
Inpatient RehabilitationTeam Conference and Plan of Care Update Date: 12/22/2018   Time: 11:15 AM    Patient Name: Danny Frederick      Medical Record Number: [id number]  Date of Birth: 1934-03-19 Sex: Male         Room/Bed: 4W24C/4W24C-01 Payor Info: Payor: MEDICARE / Plan: MEDICARE PART A AND B / Product Type: *No Product type* /    Admitting Diagnosis: cva  Admit Date/Time:  12/13/2018  4:44 PM Admission Comments: No comment available   Primary Diagnosis:  <principal problem not specified> Principal Problem: <principal problem not specified>  Patient Active Problem List   Diagnosis Date Noted  . Hyperlipidemia 12/13/2018  . Family hx-stroke 12/13/2018  . ICH (intracerebral hemorrhage) (HCC) - L BG 12/11/2018  . Lip laceration 12/07/2018  . Impaired fasting glucose 12/06/2018  . Spinal stenosis of lumbar region 09/08/2017  . Atherosclerosis of aorta (HCC) 11/08/2015  . Recurrent Clostridium difficile diarrhea   . Bacteremia   . S/P ascending aortic aneurysm repair 01/24/2014  . Ruptured thoracic aortic aneurysm (HCC) 01/23/2014  . 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 Right ventricular dysfunction 01/23/2014  . Solitary pulmonary nodule 12/08/2013  . Chest pain 12/02/2013  . Allergic conjunctivitis 10/20/2012  . Syncope 01/22/2012  . Hypothyroidism 01/22/2011  . Essential hypertension, benign 01/22/2011  . GERD (gastroesophageal reflux disease) 01/22/2011  . Iron deficiency anemia 01/22/2011  . IRON DEFICIENCY ANEMIA SECONDARY TO BLOOD LOSS 11/08/2008  . ANEMIA 05/26/2008  . Essential hypertension 03/02/2008  . DYSPNEA 03/02/2008    Expected Discharge Date: Expected Discharge Date: 12/28/18  Team Members Present: Physician leading conference: Dr. Claudette Laws Social Worker Present: Dossie Der, LCSW Nurse Present: Vincente Poli, RN PT Present: Woodfin Ganja, PT OT Present: Perrin Maltese, OT SLP Present: Colin Benton, SLP PPS Coordinator  present : Fae Pippin     Current Status/Progress Goal Weekly Team Focus  Medical   Has parkinsonian features with bilateral chronic basal ganglia infarct  Reduce fall risk  Work on balance   Bowel/Bladder   contient of bowel and bladder LBM 4/21  remain continent of bowel & bladder  assess toileting needs qshift and PRN   Swallow/Nutrition/ Hydration             ADL's   Supervision for UB self care with min guard for LB selfcare sit to stand.  Min guard for toilet transfers and toileting tasks with use of the 3:1 and RW.  Still with ataxic RUE functional use, especially with FM coordination tasks.   modified independent level overall  selfcare retraining, transfer training, balance retraining, neuromuscular re-education, DME education   Mobility   supervision bed mobility and sit<>stand transfers, CGA bed<>chair transfers and ambulation with RW  supervision with all functional mobility and gait  standing balance, R UE neuro re-ed, ambulation, and stair navigation   Communication             Safety/Cognition/ Behavioral Observations            Pain   no c/o pain  remain pain free  assess pain qshift and PRN   Skin   no skin issues  remain free of skin break down  assess skin qshift and PRN      *See Care Plan and progress notes for long and short-term goals.     Barriers to Discharge  Current Status/Progress Possible Resolutions Date Resolved   Physician    Decreased caregiver support;Medical stability     progressing toward goals  Cont rehab, family ed      Nursing                  PT                    OT                  SLP                SW                Discharge Planning/Teaching Needs:  Home with daughter due to will need supervision for ambulation and wife is unable to provide this. Will need family education with daughter prior to discharge.      Team Discussion:  Progressing toward his goals of supervision level, can not upgrade to mod/i with  ambulation due to shuffling gait and high risk to fall due to this. Working on balance strategies. Family education on Friday with wife and daughter. Incontinent at times due to does not use the call bell  Revisions to Treatment Plan:  DC 4/28    Continued Need for Acute Rehabilitation Level of Care: The patient requires daily medical management by a physician with specialized training in physical medicine and rehabilitation for the following conditions: Daily direction of a multidisciplinary physical rehabilitation program to ensure safe treatment while eliciting the highest outcome that is of practical value to the patient.: Yes Daily medical management of patient stability for increased activity during participation in an intensive rehabilitation regime.: Yes Daily analysis of laboratory values and/or radiology reports with any subsequent need for medication adjustment of medical intervention for : Blood pressure problems;Neurological problems   I attest that I was present, lead the team conference, and concur with the assessment and plan of the team. Teleconference held due to COVID 19   Ayaansh Smail, Lemar Livings 12/22/2018, 3:19 PM

## 2018-12-22 NOTE — Progress Notes (Addendum)
Physical Therapy Session Note  Patient Details  Name: Danny Frederick MRN: [id number] Date of Birth: 1934/06/26  Today's Date: 12/22/2018 PT Individual Time: 1300-1330 PT Individual Time Calculation (min): 30 min   Short Term Goals:  Week 2:  PT Short Term Goal 1 (Week 2): STG=LTG due to ELOS      Skilled Therapeutic Interventions/Progress Updates:   Pt seated in recliner.  Sit> stand with close supervision/CGA.  Pt demonstrates A-P sway upon standing. Noted to stand in L knee flexion.  Standing neuromuscular re-education via multimodal cues, demo for 10 x 1 bil heel raises, toe raises.  Seated self- stretching R/L hamstrings and heel cords, x 30 seconds x 3.   Pt has tight bil hamstrings and heel cords which limit toe raises and impact balance strategies  After stretching, pt demonstrated minimally active bil ankle strategies in standing, given external perturbations.   Pt able to verbalize importance of stretching, program needed BID x 30 seconds x 3 q LE  Pt left resting in recliner with needs at hand and seat pad alarm set.     Therapy Documentation Precautions:  Precautions Precautions: Fall Restrictions Weight Bearing Restrictions: No  Pain: pt denies Pain Assessment Pain Scale: Faces Pain Score: 0-No pain       Therapy/Group: Individual Therapy  Bexlee Bergdoll 12/22/2018, 5:11 PM

## 2018-12-22 NOTE — Progress Notes (Signed)
Occupational Therapy Weekly Progress Note  Patient Details  Name: Danny Frederick MRN: [id number] Date of Birth: 1934-02-09  Beginning of progress report period: December 14, 2018 End of progress report period: December 22, 2018  Today's Date: 12/22/2018 OT Individual Time: 5974-1638 OT Individual Time Calculation (min): 56 min    Patient has met 1 of 4 short term goals.  [name] is making steady progress with OT at this time.  He currently, demonstrates completion of UB selfcare with supervision and LB selfcare sit to stand with min guard assist.  He is able to complete toilet transfers with min guard using the RW and 3:1.  Clothing management is currently min assist level sit to stand.  He still demonstrates increased ataxia with RUE functional use but can complete selfcare tasks at a diminished level with increased time.  He still demonstrates decreased balance with decreased smoothness of gait and overall ataxia movement.  Use of the RW is needed for support in all instances or he attempts to use furniture or objects that he can reach for support.  Feel he will likely not meet modified independent level goals overall and will need supervision for safety.  Recommend continued OT to further progress to consistent supervision level.  Family is planning to come in for education on 4/24 in anticipation of discharge 4/28.   Patient continues to demonstrate the following deficits: muscle weakness, impaired timing and sequencing, unbalanced muscle activation, ataxia and decreased coordination and decreased standing balance and decreased balance strategies and therefore will continue to benefit from skilled OT intervention to enhance overall performance with BADL and Reduce care partner burden.  Patient not progressing toward long term goals.  See goal revision..  Continue plan of care.  OT Short Term Goals Week 2:  OT Short Term Goal 1 (Week 2): Continue working on established LTGs downgraded to  supervision level overall.   Skilled Therapeutic Interventions/Progress Updates:    Pt completed bathing and dressing sit to stand at the sink during session.  Min guard assist for functional mobility to the sink from the bedside recliner next to the window.  Supervision for completion of all UB selfcare with min guard for LB selfcare sit to stand and for oral hygiene in standing.  He needed mod assist for placing the right hearing aide in the right ear as well secondary to decreased sensation and coordination in the RUE.  Discussed the need for a tub/shower bench at home to place in the tub, but pt is not sure if he wants this at the current time.  Will discuss and provide hands on practice next visit as well as discuss with his family when they come in for education.   Therapy Documentation Precautions:  Precautions Precautions: Fall Restrictions Weight Bearing Restrictions: No  Pain: Pain Assessment Pain Scale: Faces Pain Score: 0-No pain ADL: See Care Tool Section for some details of ADL  Therapy/Group: Individual Therapy  Diarra Ceja OTR/L 12/22/2018, 3:55 PM

## 2018-12-22 NOTE — Progress Notes (Signed)
[facility] PHYSICAL MEDICINE & REHABILITATION PROGRESS NOTE   Subjective/Complaints:  Ambulating with PT in hallway, CGA using walker Pt states he's exercised all of his life  ROS: Patient denies CP, SOB, N/V/D Objective:   No results found. No results for input(s): WBC, HGB, HCT, PLT in the last 72 hours. No results for input(s): NA, K, CL, CO2, GLUCOSE, BUN, CREATININE, CALCIUM in the last 72 hours.  Intake/Output Summary (Last 24 hours) at 12/22/2018 0933 Last data filed at 12/22/2018 0433 Gross per 24 hour  Intake 639 ml  Output 225 ml  Net 414 ml     Physical Exam: Vital Signs Blood pressure (!) 163/72, pulse (!) 53, temperature 97.7 F (36.5 C), resp. rate 17, height 5' 4"  (1.626 m), weight 60.2 kg, SpO2 95 %.  Constitutional: No distress . Vital signs reviewed. HEENT: EOMI, oral membranes moist Neck: supple Cardiovascular: RRR without murmur. No JVD    Respiratory: CTA Bilaterally without wheezes or rales. Normal effort    GI: BS +, non-tender, non-distended   Extremities: No clubbing, cyanosis, or edema Skin: No evidence of breakdown, no evidence of rash Neurologic: Cranial nerves II through XII intact, motor strength is 5/5 in Left  deltoid, bicep, tricep, grip, hip flexor, knee extensors, ankle dorsiflexor and plantar flexor, 4/5 on right  Sensory exam normal sensation to light touch and proprioception in bilateral upper and lower extremities Cerebellar exam normal left  finger to nose to finger as well as heel to shin in bilateral upper and lower extremities Right side has moderate UE  with FTN. Musculoskeletal: full ROM , no pain Psych: pleasant   Assessment/Plan: 1. Functional deficits secondary to Left Basal ganglia ICH  which require 3+ hours per day of interdisciplinary therapy in a comprehensive inpatient rehab setting.  Physiatrist is providing close team supervision and 24 hour management of active medical problems listed below.  Physiatrist and  rehab team continue to assess barriers to discharge/monitor patient progress toward functional and medical goals  Care Tool:  Bathing    Body parts bathed by patient: Right arm, Left arm, Chest, Abdomen, Front perineal area, Buttocks, Left lower leg, Right lower leg, Left upper leg, Right upper leg, Face         Bathing assist Assist Level: Contact Guard/Touching assist     Upper Body Dressing/Undressing Upper body dressing   What is the patient wearing?: Pull over shirt    Upper body assist Assist Level: Set up assist    Lower Body Dressing/Undressing Lower body dressing      What is the patient wearing?: Pants, Underwear/pull up     Lower body assist Assist for lower body dressing: Minimal Assistance - Patient > 75%     Toileting Toileting    Toileting assist Assist for toileting: Supervision/Verbal cueing     Transfers Chair/bed transfer  Transfers assist     Chair/bed transfer assist level: Contact Guard/Touching assist     Locomotion Ambulation   Ambulation assist      Assist level: Contact Guard/Touching assist Assistive device: Walker-rolling Max distance: 22f   Walk 10 feet activity   Assist     Assist level: Contact Guard/Touching assist Assistive device: Walker-rolling   Walk 50 feet activity   Assist Walk 50 feet with 2 turns activity did not occur: Safety/medical concerns  Assist level: Contact Guard/Touching assist Assistive device: Walker-rolling    Walk 150 feet activity   Assist Walk 150 feet activity did not occur: Safety/medical concerns  Assist level:  Contact Guard/Touching assist Assistive device: Walker-rolling    Walk 10 feet on uneven surface  activity   Assist Walk 10 feet on uneven surfaces activity did not occur: Safety/medical concerns         Wheelchair     Assist Will patient use wheelchair at discharge?: No      Wheelchair assist level: Supervision/Verbal cueing Max wheelchair  distance: 121f     Wheelchair 50 feet with 2 turns activity    Assist        Assist Level: Supervision/Verbal cueing   Wheelchair 150 feet activity     Assist     Assist Level: Supervision/Verbal cueing    Medical Problem List and Plan: 1.Decreased functional mobilitysecondary to small left basal ganglia ICH likely due to hypertensive crisis -CIR PT, OT , ELOS 4/28 Team conference today please see physician documentation under team conference tab, met with team face-to-face to discuss problems,progress, and goals. Formulized individual treatment plan based on medical history, underlying problem and comorbidities.  2. Antithrombotics: -DVT/anticoagulation:SCDs -antiplatelet therapy: N/A 3. Pain Management:Tylenol as needed 4. Mood:Celexa 20 mg daily -antipsychotic agents: N/A 5. Neuropsych: This patientiscapable of making decisions on hisown behalf. 6. Skin/Wound Care:Routine skin checks,   -upper lip Chromic Gut suture intact.  The laceration is well-healed, this will eventually reabsorb, if this causes discomfort for the patient certainly can be removed.  He is 3 weeks status post laceration repair 7. Fluids/Electrolytes/Nutrition:BMET 4/14 borderline low Na+, mild elevation glucose (? Fasting) 8. Hypertension. Avapro 300 mg daily, Lopressor 25 mg twice a day. Monitoras he becomes more active Vitals:   12/21/18 1942 12/22/18 0512  BP: (!) 121/54 (!) 163/72  Pulse: 62 (!) 53  Resp: 18 17  Temp: 97.8 F (36.6 C) 97.7 F (36.5 C)  SpO2: 96% 968% goal of systolic <<616 max dose irbesartan, and would not increase metoprolol due to bradycardia,    -Added low dose amlodipine 4/15 , increased to 5562m On 4/18, will increase again 4/22, if dropping too low in pm may need to split dose to 62m12mID   -follow for trend, elevation this am just prior to med admin 9. Hypothyroidism. Synthroid. Latest TSH 2.080 10.History of  ascending aortic aneurysm repair 2015. Follow-up outpatient 11. GERD. Protonix 12. BPH. Monitor voiding pattern  13. CBC normalized 4/14,no repeat needed 14.  Patient reported bowel incontinence with loose stools, reviewed bowel pattern, the patient does have loose stools at least on a daily basis but no incontinence reported.  Will change Senokot to as needed. Last liquid BM 4/21  LOS: 9 days A FACE TO FACE EVALUATION WAS PERFORMED  AndCharlett Blake22/2020, 9:33 AM

## 2018-12-22 NOTE — Progress Notes (Signed)
Social Work Patient ID: Danny Frederick, male   DOB: 01-Feb-1934, 85 y.o.   MRN: [id number] Met with pt and spoke with daughter via telephone to discuss team conference progress in therapies and the goal of supervision level. Therapy team does not feel they can get him to mod/i due to shuffling gait and high risk to fall. Will have pt's wife and daughter come in Friday @ 10:00-12:00 for education and training. Pt not using the call bell reason he has been incontinent of urine. Continue to encourage to use call bell. See family on Friday.

## 2018-12-22 NOTE — Progress Notes (Signed)
Physical Therapy Weekly Progress Note  Patient Details  Name: Danny Frederick MRN: [id number] Date of Birth: 1934/03/12  Beginning of progress report period: December 14, 2018 End of progress report period: December 22, 2018  Today's Date: 12/22/2018 PT Individual Time: 0800-0900 and 1000-1055 PT Individual Time Calculation (min): 60 min and 55 min   Patient has met 3 of 3 short term goals. Pt is progressing with all functional mobility, continues to be limited by higher level balance activities and demonstrates poor balance strategies overall. Pt currently ambulated with RW and CGA, occasional cues for RW management.   Patient continues to demonstrate the following deficits muscle weakness, decreased cardiorespiratoy endurance, decreased coordination and decreased standing balance, decreased postural control and decreased balance strategies and therefore will continue to benefit from skilled PT intervention to increase functional independence with mobility.  Patient progressing toward long term goals..  Continue plan of care.  PT Short Term Goals Week 1:  PT Short Term Goal 1 (Week 1): Pt will ambulate with LRAD x 150 ft CGA PT Short Term Goal 1 - Progress (Week 1): Met PT Short Term Goal 2 (Week 1): Pt will ascend/descend 12 steps with single rail and min assist PT Short Term Goal 2 - Progress (Week 1): Met PT Short Term Goal 3 (Week 1): Pt will complete berg balance test  PT Short Term Goal 3 - Progress (Week 1): Met Week 2:  PT Short Term Goal 1 (Week 2): STG=LTG due to ELOS  Skilled Therapeutic Interventions/Progress Updates:    Session 1: Pt using the bathroom upon PT arrival, agreeable to therapy tx and denies pain. Pt transferred to standing and performed clothing management with UE support on RW and CGA. Pt ambulated to sink to wash hands, CGA for standing balance. Pt donned brief this session, reports he is worried that his bowels have been moving unexpectedly. Pt ambulated to the gym  with CGA and RW x 200 ft. Pt worked on dynamic standing balance without UE support this session to include the following: forward step ups on 3 inch step x 2 trials, toe taps on 3 inch step, stepping forwards through agility ladder, and sidestepping through agility ladder, min-mod assist for all balance tasks this session. Pt worked on standing balance on red wedge and R UE coordination while completing peg board puzzle, min assist for balance without UE support. Pt ambulated back to room with RW and CGA, left in recliner with needs in reach and chair alarm set.   Session 2: Pt seated in recliner upon PT arrival, agreeable to therapy tx and denies pain. Pt ambulated to the dayroom with RW and CGA, x 120 ft. Pt participated in gait training on the treadmill this session using litegait harness system for safety. Pt performed sit<>stand with supervision while therapist donned harness, min assist for step up/down on treadmill. Pt ambulated x 4 minutes, 1.0-1.2 mph, x ft and 360 ft, and second trial x 4 minutes 1.4 mph and 468 ft working on gait training with cues for increased step length, heel strike and upright posture. Pt transferred to the mat with RW and CGA. Pt ambulated with trial of rollator this session x 100 ft, min assist and cues for use. Pt reports that he feels more "wobbly" with this, therapist reccomending to continue using RW instead. Pt ambulated x 100 ft with RW focusing on increased gait speed and carry over from treadmill, CGA. Pt transferred to the mat and performed hamstring stretching 2 x 1 min  bilaterally while supine using belt. Education on performing this stretch at home. Pt ambulated back to room and left in recliner and needs in reach, chair alarm set.    Therapy Documentation Precautions:  Precautions Precautions: Fall Restrictions Weight Bearing Restrictions: No   Therapy/Group: Individual Therapy  Netta Corrigan, PT, DPT 12/22/2018, 7:58 AM

## 2018-12-23 ENCOUNTER — Inpatient Hospital Stay (HOSPITAL_COMMUNITY): Payer: Self-pay | Admitting: Occupational Therapy

## 2018-12-23 ENCOUNTER — Inpatient Hospital Stay (HOSPITAL_COMMUNITY): Payer: Self-pay | Admitting: Physical Therapy

## 2018-12-23 NOTE — Progress Notes (Signed)
Occupational Therapy Session Note  Patient Details  Name: Danny Frederick MRN: [id number] Date of Birth: 02/22/1934  Today's Date: 12/23/2018 OT Individual Time: 1017-5102 OT Individual Time Calculation (min): 72 min    Short Term Goals: Week 2:  OT Short Term Goal 1 (Week 2): Continue working on established LTGs downgraded to supervision level overall.   Skilled Therapeutic Interventions/Progress Updates:    Pt completed supine to sit with supervision and then donned his slip on shoes EOB at the same level.  He was able to then ambulate down to the ADL apartment with mod instructional cueing to maintain more upright posture with use of the RW and min guard assist for balance.  Adjusted walker one notch higher as well to help with this.  Once in the apartment, had pt transfer to the tub bench with min guard assist in order to demonstrate the type of equipment he will possibly need at discharge.  He was then able to complete transfers to the couch and the recliner with overall min guard assist.  Educated pt on the need to scoot out to the edge of surfaces and make sure to have his feet under him when completing sit to stand, as at times he exhibits decreased anterior weightshift with posterior bias during transitions.  Had pt next ambulate to the therapy gym where he worked on RUE coordination in standing using [place].  Positioned pieces to incorporate reaching down to the floor as well as rotation to the right.  Min guard assist to maintain standing balance for intervals of 3-4 mins.  Finished session with ambulation back to the room with pt transferring to the recliner with call button and phone in reach.   Therapy Documentation Precautions:  Precautions Precautions: Fall Restrictions Weight Bearing Restrictions: No  Pain: Pain Assessment Pain Scale: 0-10 Pain Score: 0-No pain ADL: See Care Tool Section for some details of ADL  Therapy/Group: Individual Therapy  Bonny Egger  OTR/L 12/23/2018, 11:57 AM

## 2018-12-23 NOTE — Progress Notes (Signed)
Occupational Therapy Session Note  Patient Details  Name: Danny Frederick MRN: [id number] Date of Birth: 1933/11/08  Today's Date: 12/23/2018 OT Individual Time: 1035-1130 OT Individual Time Calculation (min): 55 min    Short Term Goals: Week 1:  OT Short Term Goal 1 (Week 1): Pt will complete all bathing with supervision sit to stand for 3 consecutive sessions.  OT Short Term Goal 1 - Progress (Week 1): Not met OT Short Term Goal 2 (Week 1): Pt will complete all dressing with supervision sit to stand for 3 consecutive sessions.  OT Short Term Goal 2 - Progress (Week 1): Not met OT Short Term Goal 3 (Week 1): Pt will complete toilet transfers with supervision sit to stand.   OT Short Term Goal 3 - Progress (Week 1): Not met OT Short Term Goal 4 (Week 1): Pt will use the RUE at a diminshed level for selfcare tasks with supervision.  OT Short Term Goal 4 - Progress (Week 1): Met Week 2:  OT Short Term Goal 1 (Week 2): Continue working on established LTGs downgraded to supervision level overall.       Skilled Therapeutic Interventions/Progress Updates:    Pt seen this session to focus on general coordination of UEs, upper body strength and postural strength.  Pt ambulated to gym with RW with close S.  Once in the gym, he worked on wall push ups for weight bearing and strength facilitation and then transitioned into quadriped on therapy mat.  From quadriped, worked on weight shifts forward/ back and laterally, alternating reaching one hand forward, and mini push ups moving into modified child's pose between exercises. To stretch quadriceps, pt able to shift weight back onto his heels and sit up right for 30 seconds.  Pt moved into supine with verbal cues for technique. Pt's rounded posture contributes to forward head placement.  Pt does not like to lay flat without pillow support but was able to do so and achieved lengthening of his spine and chest expansion.  Pt moved into sitting at [facility] and  worked with level 3 theraband for 2 upper back/postural exercises: B arm horizontal abd and single arm bow and arrow.  Pt fatigues after just 5 reps, rested then repeated.  In the bow and arrow, when he uses his L arm to pull back he has difficulty maintain sit balance and leans to his L.  Pt did recognize this and tried to use his core muscles to stabilze himself. Pt ambulated back to room with cues to keep his arms by his side with the walker not to push it forward.  Pt resting in recliner with alarm on and all needs met.   Therapy Documentation Precautions:  Precautions Precautions: Fall Restrictions Weight Bearing Restrictions: No   Pain: Pain Assessment Pain Scale: 0-10 Pain Score: 0-No pain    Therapy/Group: Individual Therapy  [place] 12/23/2018, 8:27 AM

## 2018-12-23 NOTE — Progress Notes (Signed)
Physical Therapy Session Note  Patient Details  Name: Danny Frederick MRN: [id number] Date of Birth: Jan 28, 1934  Today's Date: 12/23/2018 PT Individual Time: 1601-1700 PT Individual Time Calculation (min): 59 min   Short Term Goals: Week 2:  PT Short Term Goal 1 (Week 2): STG=LTG due to ELOS  Skilled Therapeutic Interventions/Progress Updates:   Pt received sitting in recliner and agreeable to therapy session. Pt performs sit<>stand from recliner/EOM to RW/no AD with close supervision throughout session. Pt ambulated ~140ft using RW to/from gym with close supervision for safety demonstrating no R toe catching or LOB during ambulation - cuing throughout for closer AD management and improved upright posture. Pt performed cone weaving using RW to practice obstacle navigation and improved AD management in smaller spaces with close supervision and no LOB. Pt performed B LE hamstring stretch in sitting 62min each with visual demonstration and verbal cuing for proper technique. Pt performed ladder stepping forward placing each foot in a square focusing on fully weight shifting to stance leg - CGA for steadying with 3 instance of min/mod assist to prevent LOB with pt demonstrating ability to recruit balance strategies with minimal cuing - verbal cuing throughout for full weight shift. Performed dynamic standing balance of cone tapping in 1/2 circle with CGA throughout and 1x min assist for LOB otherwise pt demonstrating recruitment of hip strategy to prevent LOB with CGA for steadying - cuing throughout for repositioning of feet between each cone tap and for increased stance control. Performed 2 sets of repeated R LE step up/down on 4" step without UE support and CGA for steadying and intermittent min assist for posterior LOB when stepping backwards and down with L LE - during 2nd set progressed to performing with CGA throughout and no LOB. Alternated to performing 2 sets of stepped up/down with L LE  demonstrating posterior LOB when stepping backwards and down with R LE requiring min assist for balance, multimodal cuing throughout for sequencing of steps and weight shifting as well as for using hip strategy and stepping strategy to regain balance. Pt ambulated back to room as described above and left sitting in recliner with needs in reach and chair alarm on.   Therapy Documentation Precautions:  Precautions Precautions: Fall Restrictions Weight Bearing Restrictions: No  Pain: Denies pain throughout session.  Therapy/Group: Individual Therapy  Tawana Scale, PT, DPT 12/23/2018, 4:21 PM

## 2018-12-23 NOTE — Progress Notes (Signed)
[facility] PHYSICAL MEDICINE & REHABILITATION PROGRESS NOTE   Subjective/Complaints:  Patient states that he has had problems with ambulation for several months prior to admission.  ROS: Patient denies CP, SOB, N/V/D Objective:   No results found. No results for input(s): WBC, HGB, HCT, PLT in the last 72 hours. No results for input(s): NA, K, CL, CO2, GLUCOSE, BUN, CREATININE, CALCIUM in the last 72 hours.  Intake/Output Summary (Last 24 hours) at 12/23/2018 0954 Last data filed at 12/23/2018 0701 Gross per 24 hour  Intake 720 ml  Output 800 ml  Net -80 ml     Physical Exam: Vital Signs Blood pressure 138/67, pulse (!) 59, temperature 98 F (36.7 C), temperature source Oral, resp. rate 20, height 5\' 4"  (1.626 m), weight 60.2 kg, SpO2 99 %.  Constitutional: No distress . Vital signs reviewed. HEENT: EOMI, oral membranes moist Neck: supple Cardiovascular: RRR without murmur. No JVD    Respiratory: CTA Bilaterally without wheezes or rales. Normal effort    GI: BS +, non-tender, non-distended   Extremities: No clubbing, cyanosis, or edema Skin: No evidence of breakdown, no evidence of rash Neurologic: Cranial nerves II through XII intact, motor strength is 5/5 in Left  deltoid, bicep, tricep, grip, hip flexor, knee extensors, ankle dorsiflexor and plantar flexor, 4/5 on right  Sensory exam normal sensation to light touch and proprioception in bilateral upper and lower extremities Cerebellar exam normal left  finger to nose to finger as well as heel to shin in bilateral upper and lower extremities Right side has moderate UE  with FTN. Musculoskeletal: full ROM , no pain Psych: pleasant   Assessment/Plan: 1. Functional deficits secondary to Left Basal ganglia ICH  which require 3+ hours per day of interdisciplinary therapy in a comprehensive inpatient rehab setting.  Physiatrist is providing close team supervision and 24 hour management of active medical problems listed  below.  Physiatrist and rehab team continue to assess barriers to discharge/monitor patient progress toward functional and medical goals  Care Tool:  Bathing    Body parts bathed by patient: Right arm, Left arm, Chest, Abdomen, Front perineal area, Buttocks, Left lower leg, Right lower leg, Left upper leg, Right upper leg, Face         Bathing assist Assist Level: Contact Guard/Touching assist     Upper Body Dressing/Undressing Upper body dressing   What is the patient wearing?: Pull over shirt    Upper body assist Assist Level: Set up assist    Lower Body Dressing/Undressing Lower body dressing      What is the patient wearing?: Pants, Underwear/pull up     Lower body assist Assist for lower body dressing: Contact Guard/Touching assist     Toileting Toileting    Toileting assist Assist for toileting: Supervision/Verbal cueing     Transfers Chair/bed transfer  Transfers assist     Chair/bed transfer assist level: Contact Guard/Touching assist     Locomotion Ambulation   Ambulation assist      Assist level: Contact Guard/Touching assist Assistive device: Walker-rolling Max distance: 10'   Walk 10 feet activity   Assist     Assist level: Contact Guard/Touching assist Assistive device: Walker-rolling   Walk 50 feet activity   Assist Walk 50 feet with 2 turns activity did not occur: Safety/medical concerns  Assist level: Contact Guard/Touching assist Assistive device: Walker-rolling    Walk 150 feet activity   Assist Walk 150 feet activity did not occur: Safety/medical concerns  Assist level: Contact Guard/Touching  assist Assistive device: Walker-rolling    Walk 10 feet on uneven surface  activity   Assist Walk 10 feet on uneven surfaces activity did not occur: Safety/medical concerns         Wheelchair     Assist Will patient use wheelchair at discharge?: No      Wheelchair assist level: Supervision/Verbal  cueing Max wheelchair distance: 163ft     Wheelchair 50 feet with 2 turns activity    Assist        Assist Level: Supervision/Verbal cueing   Wheelchair 150 feet activity     Assist     Assist Level: Supervision/Verbal cueing    Medical Problem List and Plan: 1.Decreased functional mobilitysecondary to small left basal ganglia ICH likely due to hypertensive crisis -CIR PT, OT , ELOS 4/28 Discussed discharge date, he agrees  2. Antithrombotics: -DVT/anticoagulation:SCDs -antiplatelet therapy: N/A 3. Pain Management:Tylenol as needed 4. Mood:Celexa 20 mg daily -antipsychotic agents: N/A 5. Neuropsych: This patientiscapable of making decisions on hisown behalf. 6. Skin/Wound Care:Routine skin checks,    7. Fluids/Electrolytes/Nutrition:BMET 4/14 borderline low Na+, mild elevation glucose (? Fasting) 8. Hypertension. Avapro 300 mg daily, Lopressor 25 mg twice a day. Monitoras he becomes more active Vitals:   12/22/18 1447 12/22/18 1956  BP: (!) 134/57 138/67  Pulse: (!) 52 (!) 59  Resp: 18 20  Temp: 97.7 F (36.5 C) 98 F (36.7 C)  SpO2: 100% 92%  goal of systolic <330, max dose irbesartan, and would not increase metoprolol due to bradycardia,    -Added low dose amlodipine 4/15 , increased to 5mg   On 4/18, will increase again 4/22, well controlled 9. Hypothyroidism. Synthroid. Latest TSH 2.080 10.History of ascending aortic aneurysm repair 2015. Follow-up outpatient 11. GERD. Protonix 12. BPH. Monitor voiding pattern  13. CBC normalized 4/14,no repeat needed 14.  Patient reported bowel incontinence with loose stools, reviewed bowel pattern, the patient does have loose stools at least on a daily basis but no incontinence reported.  Will change Senokot to as needed.  No further complaints  LOS: 10 days A FACE TO FACE EVALUATION WAS PERFORMED  Charlett Blake 12/23/2018, 9:54 AM

## 2018-12-23 NOTE — Progress Notes (Signed)
Occupational Therapy Session Note  Patient Details  Name: Danny Frederick MRN: [id number] Date of Birth: 1933/12/15  Today's Date: 12/23/2018 OT Individual Time: 6967-8938 OT Individual Time Calculation (min): 87 min    Skilled Therapeutic Interventions/Progress Updates: particiatpion as follows:  Sitting on toilet upon arrival for OT and completed clothing mgmt with distant S;    Toilet transfer via walker= supevision  Ambulated RW ktichen to cmpmlete home skills in standing via walker with CGA to Min A for balance at walk as he fatigued (swept floor and tidying up room a bit).   He had a loose stool accident following having had stool softener earlier in the day (he stated).  He thought he was releasing gas; however, loose stool went into his underwear.  He cmpleted toileting and cleansing of his periarea with close S for squating and standing at walker for a bi of time to clean himself.     Otherwise, he participated in stretching of his heel cords on foam wedges and loosing his hip flexors in standing position (help to release tight hip flexors to help with more upright posture for self care and transfers, etc.  He was left seated in his recliner in his room with call bell in place.  Continue OT Plan of Care.     Therapy Documentation Precautions:  Precautions Precautions: Fall Restrictions Weight Bearing Restrictions: No  Pain:denied     Therapy/Group: Individual Therapy  Alfredia Ferguson [facility] 12/23/2018, 10:01 PM

## 2018-12-24 ENCOUNTER — Inpatient Hospital Stay (HOSPITAL_COMMUNITY): Payer: Self-pay | Admitting: Physical Therapy

## 2018-12-24 ENCOUNTER — Inpatient Hospital Stay (HOSPITAL_COMMUNITY): Payer: Self-pay | Admitting: Occupational Therapy

## 2018-12-24 MED ORDER — PSYLLIUM 95 % PO PACK
1.0000 | PACK | Freq: Every day | ORAL | Status: DC
  Administered 2018-12-24: 1 via ORAL
  Filled 2018-12-24 (×5): qty 1

## 2018-12-24 MED ORDER — HYDROCORTISONE 1 % EX CREA
TOPICAL_CREAM | Freq: Three times a day (TID) | CUTANEOUS | Status: DC | PRN
  Filled 2018-12-24: qty 28

## 2018-12-24 NOTE — Progress Notes (Signed)
Social Work Patient ID: Danny Frederick, male   DOB: [birth year], 85 y.o.   MRN: [id number] Wife and daughter here for family education going well and pt doing better today and smiling with them here. Has all equipment and will set up follow up therapies at discharge.

## 2018-12-24 NOTE — Progress Notes (Signed)
Occupational Therapy Session Note  Patient Details  Name: Danny Frederick MRN: [id number] Date of Birth: 28-Mar-1934  Today's Date: 12/24/2018 OT Individual Time: 1003-1101 OT Individual Time Calculation (min): 58 min    Short Term Goals: Week 2:  OT Short Term Goal 1 (Week 2): Continue working on established LTGs downgraded to supervision level overall.   Skilled Therapeutic Interventions/Progress Updates:    Pt's daughter and spouse in for family education.  Pt was able to ambulate with the RW with close supervision to collect a towel for washing up at the sink.  Washcloth was already positioned on the sink, so pt then transferred to a chair with arms to complete bathing.  He was able to complete all grooming tasks with supervision as well as bathing tasks.  He needed min facilitation to donn his belt in standing, but all other dressing was completed with supervision.  Min assist needed to apply hearing aide in the right ear as well.  Discussed possibilities for shower seats at home and where the can be purchased if pt decides that he wants one.  Provided handout on FM coordination tasks to be completed at home as well and educated pt and family on performance of them.  Finished session with pt sitting in the bedside chair with family present awaiting PT.  All questions answered and family satisfied with pt's current functional level.    Therapy Documentation Precautions:  Precautions Precautions: Fall Restrictions Weight Bearing Restrictions: No  Pain: Pain Assessment Pain Score: 0-No pain Pain Type: Chronic pain ADL: See Care Tool Section for some details of ADL  Therapy/Group: Individual Therapy  Lirio Bach OTR/L 12/24/2018, 12:25 PM

## 2018-12-24 NOTE — Progress Notes (Signed)
Physical Therapy Session Note  Patient Details  Name: Danny Frederick MRN: [id number] Date of Birth: 1934-06-27  Today's Date: 12/24/2018 PT Individual Time: 1101-1204 and 1635-1740 PT Individual Time Calculation (min): 63 min  and 65 min  Short Term Goals: Week 2:  PT Short Term Goal 1 (Week 2): STG=LTG due to ELOS  Skilled Therapeutic Interventions/Progress Updates:   Session 1: Pt received sitting in chair with wife and daughter present. Pt/family educated on pt's need to use RW for all standing and ambulation tasks as well as need for bag on RW to carry items around when pt's walking. Pt/family educated on decreasing fall risk in the home including footwear, rugs, and lighting. Pt's family educated on use of gait belt and proper positioning to provide CGA to patient when he is standing/ambulating with pt's wife demonstrating understanding as pt ambulated ~181ft to gym. Pt ascended/descended 4 steps using bilateral handrails and ascending with reciprocal pattern and descending with step-to pattern - therapist providing visual demonstration with verbal education on proper positoining for pt's family when providing CGA. Pt ascended/descended 8 steps as described above with pt's wife providing proper CGA with minimal progressed to no cuing from therapist. Pt ambulated ~122ft to ortho gym using RW with pt's wife providing proper CGA without cuing. Pt/family educated on car transfer sequencing and pt performed 2x ambulatory car transfers using RW with pt's wife providing CGA for steadying with minimal progressed to no cuing from therapist. Pt ambulated ~145ft using RW back to other gym with pt's wife providing proper CGA. Performed 1/2 circle cone taps with CGA throughout and pt demonstrating increased difficulty stepping L LE laterally and R LE anterior at a diagonal requiring intermittent min assist for balance. Pt performed alternate B LE foot taps on 4" step, no UE support, with CGA from therapist and  1xmin assist for minor LOB with min assist provided for recovery. Progressed to RLE step up/down 4" step, no UE support, with CGA for steadying and L LE step up/down with CGA throughout and 2x LOB requiring min assist for recovery. Pt/family educated on fall safety at home in the event of a fall as well as floor transfer technique. Pt performed floor transfer with min assist for balance and cuing throughout for sequencing. Pt ambulated ~130ft back to room using RW with pt's wife providing proper CGA for steadying. Pt left sitting in recliner with needs in reach, family present, and chair alarm on.   Session 2: Pt received supine in bed and agreeable to therapy session. Pt performs supine to sit, HOB partially elevated and using bedrails, with supervision. Pt performs sit<>stand from EOB/chair/EOM to RW/no AD with close supervision throughout session. Pt ambulated ~144ft x2 to/from gym using RW with close supervision for safety and no LOB.   Pt performed the following exercises with therapist providing verbal/tactile/visual cuing throughout for proper technique - pt provided printed HEP with these exercises as well: - seated hamstring stretch 1 minute each LE - supine hamstring stretch 1 minute each LE - supine bridge 10 repetitions - pt unable to advance to 1 leg bridge despite attempts - attempted side lying hip abduction with pt reporting this causes back pain despite attempts at repositioning continues to cause back pain so deferred this exercise - quadruped alternating shoulder taps 2 x10 repetitions - quadruped alternating LE raises 2x10 repetitions - cuing and manual facilitation for maintaining hips level - standing narrow BOS 30 seconds - standing semitandem 30 seconds each LE in front - repeated  sit<>stand without UE support x12  Pt returned to room as described above and left sitting in recliner with chair alarm on and dinner set-up.    Therapy Documentation Precautions:   Precautions Precautions: Fall Restrictions Weight Bearing Restrictions: No  Pain: Reports no pain during either therapy session.  Therapy/Group: Individual Therapy  Tawana Scale, PT, DPT 12/24/2018, 7:52 AM

## 2018-12-24 NOTE — Discharge Instructions (Signed)
Inpatient Rehab Discharge Instructions  Danny Frederick Discharge date and time: No discharge date for patient encounter.   Activities/Precautions/ Functional Status: Activity: activity as tolerated Diet: regular diet Wound Care: none needed Functional status:  ___ No restrictions     ___ Walk up steps independently ___ 24/7 supervision/assistance   ___ Walk up steps with assistance ___ Intermittent supervision/assistance  ___ Bathe/dress independently ___ Walk with walker     _x__ Bathe/dress with assistance ___ Walk Independently    ___ Shower independently ___ Walk with assistance    ___ Shower with assistance ___ No alcohol     ___ Return to work/school ________  Special Instructions: No driving smoking or alcohol    COMMUNITY REFERRALS UPON DISCHARGE:    Home Health:   [facility]  FWYOV:[phone]   Date of last service:12/28/2018  Medical Equipment/Items Ordered:HAS ALL NEEDED EQUIPMENT     GENERAL COMMUNITY RESOURCES FOR PATIENT/FAMILY: Support Groups:[facility] SUPPORT GROUP THE SECOND Thursday OF EACH MONTH ( SEPT-MAY) @ 6:00-7:00 PM ON THE REHAB UNIT QUESTIONS CALL [facility] [phone]   My questions have been answered and I understand these instructions. I will adhere to these goals and the provided educational materials after my discharge from the hospital.  Patient/Caregiver Signature _______________________________ Date __________  Clinician Signature _______________________________________ Date __________  Please bring this form and your medication list with you to all your follow-up doctor's appointments.

## 2018-12-24 NOTE — Plan of Care (Signed)
  Problem: Consults Goal: RH STROKE PATIENT EDUCATION Description See Patient Education module for education specifics  Outcome: Progressing   Problem: RH SAFETY Goal: RH STG ADHERE TO SAFETY PRECAUTIONS W/ASSISTANCE/DEVICE Description STG Adhere to Safety Precautions With Mod I Assistance/Device.  Outcome: Progressing

## 2018-12-24 NOTE — Progress Notes (Signed)
[place] PHYSICAL MEDICINE & REHABILITATION PROGRESS NOTE   Subjective/Complaints:  No issues overnite, still with loose stool, had C diff during a previous hospitalization 2016 but does  Have any abd pain or nausea or fever. No recent antibiotics  ROS: Patient denies CP, SOB, N/V/D Objective:   No results found. No results for input(s): WBC, HGB, HCT, PLT in the last 72 hours. No results for input(s): NA, K, CL, CO2, GLUCOSE, BUN, CREATININE, CALCIUM in the last 72 hours.  Intake/Output Summary (Last 24 hours) at 12/24/2018 0829 Last data filed at 12/24/2018 0606 Gross per 24 hour  Intake 240 ml  Output 200 ml  Net 40 ml     Physical Exam: Vital Signs Blood pressure (!) 144/78, pulse 64, temperature 98.1 F (36.7 C), temperature source Oral, resp. rate 20, height 5\' 4"  (1.626 m), weight 60.2 kg, SpO2 96 %.  Constitutional: No distress . Vital signs reviewed. HEENT: EOMI, oral membranes moist Neck: supple Cardiovascular: RRR without murmur. No JVD    Respiratory: CTA Bilaterally without wheezes or rales. Normal effort    GI: BS +, non-tender, non-distended  No masses Extremities: No clubbing, cyanosis, or edema Skin: No evidence of breakdown, no evidence of rash Neurologic: Cranial nerves II through XII intact, motor strength is 5/5 in Left  deltoid, bicep, tricep, grip, hip flexor, knee extensors, ankle dorsiflexor and plantar flexor, 4/5 on right  Sensory exam normal sensation to light touch and proprioception in bilateral upper and lower extremities Cerebellar exam normal left  finger to nose to finger as well as heel to shin in bilateral upper and lower extremities Right side has moderate UE  with FTN. Musculoskeletal: full ROM , no pain Psych: pleasant   Assessment/Plan: 1. Functional deficits secondary to Left Basal ganglia ICH  which require 3+ hours per day of interdisciplinary therapy in a comprehensive inpatient rehab setting.  Physiatrist is providing close  team supervision and 24 hour management of active medical problems listed below.  Physiatrist and rehab team continue to assess barriers to discharge/monitor patient progress toward functional and medical goals  Care Tool:  Bathing    Body parts bathed by patient: Right arm, Left arm, Chest, Abdomen, Front perineal area, Buttocks, Left lower leg, Right lower leg, Left upper leg, Right upper leg, Face         Bathing assist Assist Level: Contact Guard/Touching assist     Upper Body Dressing/Undressing Upper body dressing   What is the patient wearing?: Pull over shirt    Upper body assist Assist Level: Set up assist    Lower Body Dressing/Undressing Lower body dressing      What is the patient wearing?: Pants, Underwear/pull up     Lower body assist Assist for lower body dressing: Contact Guard/Touching assist     Toileting Toileting    Toileting assist Assist for toileting: Supervision/Verbal cueing     Transfers Chair/bed transfer  Transfers assist     Chair/bed transfer assist level: Contact Guard/Touching assist     Locomotion Ambulation   Ambulation assist      Assist level: Supervision/Verbal cueing Assistive device: Walker-rolling Max distance: 165ft   Walk 10 feet activity   Assist     Assist level: Supervision/Verbal cueing Assistive device: Walker-rolling   Walk 50 feet activity   Assist Walk 50 feet with 2 turns activity did not occur: Safety/medical concerns  Assist level: Supervision/Verbal cueing Assistive device: Walker-rolling    Walk 150 feet activity   Assist Walk 150  feet activity did not occur: Safety/medical concerns  Assist level: Supervision/Verbal cueing Assistive device: Walker-rolling    Walk 10 feet on uneven surface  activity   Assist Walk 10 feet on uneven surfaces activity did not occur: Safety/medical concerns         Wheelchair     Assist Will patient use wheelchair at discharge?: No       Wheelchair assist level: Supervision/Verbal cueing Max wheelchair distance: 134ft     Wheelchair 50 feet with 2 turns activity    Assist        Assist Level: Supervision/Verbal cueing   Wheelchair 150 feet activity     Assist     Assist Level: Supervision/Verbal cueing    Medical Problem List and Plan: 1.Decreased functional mobilitysecondary to small left basal ganglia ICH likely due to hypertensive crisis -CIR PT, OT , ELOS 4/28 Discussed discharge date, he agrees  2. Antithrombotics: -DVT/anticoagulation:SCDs -antiplatelet therapy: N/A 3. Pain Management:Tylenol as needed 4. Mood:Celexa 20 mg daily -antipsychotic agents: N/A 5. Neuropsych: This patientiscapable of making decisions on hisown behalf. 6. Skin/Wound Care:Routine skin checks,    7. Fluids/Electrolytes/Nutrition:BMET 4/14 borderline low Na+, mild elevation glucose (? Fasting) 8. Hypertension. Avapro 300 mg daily, Lopressor 25 mg twice a day. Monitoras he becomes more active Vitals:   12/23/18 2051 12/24/18 0606  BP: (!) 146/68 (!) 144/78  Pulse: (!) 56 64  Resp: 18 20  Temp: 97.9 F (36.6 C) 98.1 F (36.7 C)  SpO2: 98% 58%  goal of systolic <099, max dose irbesartan, and would not increase metoprolol due to bradycardia,    -Added low dose amlodipine 4/15 , increased to 5mg   On 4/18, will increase again 4/22, well controlled, no further changes for now 9. Hypothyroidism. Synthroid. Latest TSH 2.080 10.History of ascending aortic aneurysm repair 2015. Follow-up outpatient 11. GERD. Protonix 12. BPH. Monitor voiding pattern  13. CBC normalized 4/14,no repeat needed 14.  Patient reported bowel incontinence with loose stools, reviewed bowel pattern,no incontinence reported.  D/c  Senokot , add home med metamucil to bulk stool LOS:  11 days A FACE TO [place] E Donice Alperin 12/24/2018, 8:29 AM

## 2018-12-25 ENCOUNTER — Inpatient Hospital Stay (HOSPITAL_COMMUNITY): Payer: Self-pay | Admitting: Physical Therapy

## 2018-12-25 MED ORDER — BLOOD PRESSURE CONTROL BOOK
Freq: Once | Status: AC
  Administered 2018-12-25: 12:00:00

## 2018-12-25 NOTE — Progress Notes (Signed)
Occupational Therapy Discharge Summary  Patient Details  Name: Danny Frederick MRN: [id number] Date of Birth: Sep 19, 1933  Today's Date: 12/25/2018 OT Individual Time:  -       Patient has met 11 of 11 long term goals due to improved activity tolerance, improved balance, ability to compensate for deficits, functional use of  RIGHT upper and RIGHT lower extremity and improved coordination.  Patient to discharge at overall Supervision level.  Patient's care partner is independent to provide the necessary physical assistance at discharge.    Reasons goals not met: NA  Recommendation:  Patient will benefit from ongoing skilled OT services in home health setting to continue to advance functional skills in the area of BADL.  Pt currently still presents at a high fall risk secondary to decreased dynamic standing balance with selfcare and functional transfers.  Use of a RW or UE support is needed for safety.  In addition, he continues to demonstrate increased dysmetria, impaired sensation, as well as decreased gross and FM coordination in the right hand at this time.  Feel he will benefit from continued HHOT to further progress ADL independence as well as RUE functional use.    Equipment: No equipment provided  Reasons for discharge: treatment goals met and discharge from hospital  Patient/family agrees with progress made and goals achieved: Yes  OT Discharge Precautions/Restrictions  Precautions Precautions: Fall Precaution Comments:   Restrictions Weight Bearing Restrictions: No  Vital Signs Therapy Vitals Temp: 97.8 F (36.6 C) Temp Source: Oral Pulse Rate: (!) 57 Resp: 17 BP: 130/70 Patient Position (if appropriate): Sitting Oxygen Therapy SpO2: 100 % O2 Device: Room Air Pain  Pt with no c/o pain ADL ADL Eating: Supervision/safety Where Assessed-Eating: Chair Grooming: Modified independent(siting) Where Assessed-Grooming: Sitting at sink Upper Body Bathing: Setup Where  Assessed-Upper Body Bathing: Sitting at sink Lower Body Bathing: Supervision/safety Where Assessed-Lower Body Bathing: Sitting at sink Upper Body Dressing: Modified independent (Device) Where Assessed-Upper Body Dressing: Wheelchair Lower Body Dressing: Supervision/safety Where Assessed-Lower Body Dressing: Wheelchair Toileting: Supervision/safety Where Assessed-Toileting: Bedside Commode Toilet Transfer: Close supervision Toilet Transfer Method: Counselling psychologist: Engineer, technical sales Transfer: Minimal Museum/gallery conservator Method: Stand pivot Social research officer, government: Minimal assistance Social research officer, government Method: Stand pivot Vision Baseline Vision/History: Wears glasses Wears Glasses: At all times Patient Visual Report: No change from baseline Vision Assessment?: No apparent visual deficits Perception  Perception: Within Functional Limits Praxis Praxis: Intact Cognition Overall Cognitive Status: Within Functional Limits for tasks assessed Arousal/Alertness: Awake/alert Orientation Level: Oriented X4 Attention: Sustained Sustained Attention: Appears intact Selective Attention: Appears intact Memory: Appears intact Awareness: Appears intact Problem Solving: Appears intact Safety/Judgment: Appears intact Sensation Sensation Light Touch: Impaired Detail Peripheral sensation comments: light touch impaired throughout the RUE.  He was able to detect, but stated more numbness compared the unaffected left side Light Touch Impaired Details: Impaired RUE Hot/Cold: Appears Intact Proprioception: Impaired Detail Proprioception Impaired Details: Impaired RUE Stereognosis: Impaired Detail Stereognosis Impaired Details: Impaired RUE Coordination Gross Motor Movements are Fluid and Coordinated: No Fine Motor Movements are Fluid and Coordinated: No Coordination and Movement Description: coordination impaired R UE Finger Nose Finger Test: mild  dysmetria on Rt 9 Hole Peg Test: Lt: 36 seconds, Rt: 1:12 Motor  Motor Motor: Hemiplegia;Ataxia Motor - Discharge Observations: Pt still with decreased coordination in the RUE and RLE Mobility  Bed Mobility Rolling Right: Supervision/verbal cueing Rolling Left: Supervision/Verbal cueing Supine to Sit: Supervision/Verbal cueing Sit to Supine: Supervision/Verbal cueing Transfers Sit to Stand: Supervision/Verbal cueing  Stand to Sit: Supervision/Verbal cueing  Trunk/Postural Assessment  Cervical Assessment Cervical Assessment: Exceptions to WFL(cervical protraction) Thoracic Assessment Thoracic Assessment: Exceptions to WFL(slight thoracic rounding) Lumbar Assessment Lumbar Assessment: Exceptions to WFL(lumbar flexion in standing)  Balance Balance Balance Assessed: Yes Static Sitting Balance Static Sitting - Balance Support: Feet supported Static Sitting - Level of Assistance: 7: Independent Dynamic Sitting Balance Dynamic Sitting - Balance Support: During functional activity Dynamic Sitting - Level of Assistance: 6: Modified independent (Device/Increase time) Static Standing Balance Static Standing - Balance Support: During functional activity Static Standing - Level of Assistance: 5: Stand by assistance Dynamic Standing Balance Dynamic Standing - Balance Support: During functional activity;Bilateral upper extremity supported Dynamic Standing - Level of Assistance: 5: Stand by assistance(with use of the RW for support) Extremity/Trunk Assessment RUE Assessment RUE Assessment: Exceptions to [facility] An Passive Range of Motion (PROM) Comments: WFLS overall Active Range of Motion (AROM) Comments: Pt with history of rotator cuff injury with shoulder flexion AROM 0-90 degrees, AAROM shoulder flexion 0-130 degrees approximately General Strength Comments: Pt with overall strength 4/5 in grip, biceps, and triceps.  Decreased FM coordination noted with in-hand manipulation as well as decreased  sensation and slight dysmetria. LUE Assessment LUE Assessment: Exceptions to [facility] Active Range of Motion (AROM) Comments: limited shoulder flexion to 100 degrees secondary to history of rotator cuff surgery General Strength Comments: All other joints AROM WFLS with strength 4/5 throughout   MCGUIRE,JAMES OTR/L 12/25/2018, 4:47 PM

## 2018-12-25 NOTE — Progress Notes (Signed)
Physical Therapy Session Note  Patient Details  Name: Danny Frederick MRN: [id number] Date of Birth: [birth year]  Today's Date: 12/25/2018 PT Individual Time: 6294-7654 PT Individual Time Calculation (min): 82 min   Short Term Goals: Week 2:  PT Short Term Goal 1 (Week 2): STG=LTG due to ELOS  Skilled Therapeutic Interventions/Progress Updates:   Pt received supine in bed and agreeable to therapy session. Pt reports his daughter has a question regarding where to locate a heavy chair to place at pt's desk. Therapist called pt's daughter and spoke with her regarding size ant type of chair. Pt performed supine to sit using bed rails with supervision. Pt performed sit<>stand throughout session to RW/no AD with close supervision for pt safety. Pt ambulated ~236ft using RW with close supervision for safety and cuing for closer AD management and improved upright posture. Pt performed standing kinetron focusing on dynamic standing balance and B LE strengthening - started with B UE support progressed to no UE support with min assist for balance - mirror feedback provided for improved trunk upright and lateral weight shifting with manual facilitation for lateral weight shift and R LE hip/knee extension, multimodal cuing throughout for sequencing. Pt reports needing to urinate. Pt ambulated ~138ft to room using RW with close supervision for safety and continued cuing for improved trunk upright. Pt performed sit<>stand from [facility] over toilet using grab bars with CGA for steadying. Pt continent of urine. Pt ambulated ~158ft using RW to gym with close supervision and continued cuing. Pt performed dynamic standing balance task of standing with forefoot on wedge while performing R UE neuromuscular re-education/coordination task of matching cards with CGA throughout for steadying. Pt performed sit<>supine<>sidelying on mat table with supervision for safety. Pt attempted supine hip flexor stretch with reports of back  discomfort and unable to tolerate position- modified to sidelying but pt unable to achieve position that provided hip flexor stretch. Pt ambulated ~123ft using RW back to room with continued cuing for closer AD management, upright posture, and increased B LE foot clearance. Pt left sitting in recliner with needs in reach and chair alarm on.   Therapy Documentation Precautions:  Precautions Precautions: Fall Restrictions Weight Bearing Restrictions: No  Pain:   Denies pain during session.   Therapy/Group: Individual Therapy  Tawana Scale, PT, DPT 12/25/2018, 7:58 AM

## 2018-12-25 NOTE — Progress Notes (Signed)
Refused 0800 dose of metamucil this morning. Reports stools are either loose or he's constipated. Normal pattern at home is daily, per patient. Prune juice given tonight. Patrici Ranks A

## 2018-12-26 NOTE — Progress Notes (Signed)
Physical Therapy Session Note  Patient Details  Name: Danny Frederick MRN: [id number] Date of Birth: Mar 06, 1934  Today's Date: 12/26/2018 PT Individual Time: 1310-1350 PT Individual Time Calculation (min): 40 min   Short Term Goals: Week 2:  PT Short Term Goal 1 (Week 2): STG=LTG due to ELOS  Skilled Therapeutic Interventions/Progress Updates:    Pt using the bathroom with NT upon PT arrival, agreeable to therapy tx and denies pain. Pt ambulated to the gym with RW and CGA x 150 ft, occasional cues for RW management. Pt standing on wedge this session worked on standing balance and heel cord/hamstring stretching while tossing horseshoes, x 2 trials with cues to correct posterior LOB, min assist. Pt reports having to use bathroom, pt ambulated from gym<>bathroom 2 x 80 ft with RW and CGA. Pt performs clothing management with supervision, ambulated to sink with RW and washes hands. Pt attempted to use the bathroom but does not void this session. Pt reports he is frustrated and never knows when "it will hit." Pt ascended/descended 12 steps this session with CGA and B rails. Pt reports he continues to feel like he will need to use the bathroom soon. Pt ambulated back to room with RW and CGA x 200 ft. Pt performed sit<>stand with supervision and donned brief this session per his request in case of bowel incontinence. Pt transferred to bed with supervision and transferred to supine with supervision. Pt left supine in bed with needs in reach and bed alarm set.   Therapy Documentation Precautions:  Precautions Precautions: Fall Precaution Comments:   Restrictions Weight Bearing Restrictions: No    Therapy/Group: Individual Therapy  Netta Corrigan, PT, DPT 12/26/2018, 1:21 PM

## 2018-12-26 NOTE — Progress Notes (Signed)
[place] PHYSICAL MEDICINE & REHABILITATION PROGRESS NOTE   Subjective/Complaints:  Had good BM after prune juice, no abd pain  ROS: Patient denies CP, SOB, N/V/D Objective:   No results found. No results for input(s): WBC, HGB, HCT, PLT in the last 72 hours. No results for input(s): NA, K, CL, CO2, GLUCOSE, BUN, CREATININE, CALCIUM in the last 72 hours.  Intake/Output Summary (Last 24 hours) at 12/26/2018 0919 Last data filed at 12/26/2018 0802 Gross per 24 hour  Intake 840 ml  Output 665 ml  Net 175 ml     Physical Exam: Vital Signs Blood pressure (!) 142/79, pulse 62, temperature 98 F (36.7 C), temperature source Oral, resp. rate 18, height 5\' 4"  (1.626 m), weight 60.2 kg, SpO2 94 %.  Constitutional: No distress . Vital signs reviewed. HEENT: EOMI, oral membranes moist Neck: supple Cardiovascular: RRR without murmur. No JVD    Respiratory: CTA Bilaterally without wheezes or rales. Normal effort    GI: BS +, non-tender, non-distended  No masses Extremities: No clubbing, cyanosis, or edema Skin: No evidence of breakdown, no evidence of rash Neurologic: Cranial nerves II through XII intact, motor strength is 5/5 in Left  deltoid, bicep, tricep, grip, hip flexor, knee extensors, ankle dorsiflexor and plantar flexor, 4/5 on right  Sensory exam normal sensation to light touch and proprioception in bilateral upper and lower extremities Cerebellar exam normal left  finger to nose to finger as well as heel to shin in bilateral upper and lower extremities Right side has moderate UE  with FTN. Musculoskeletal: full ROM , no pain Psych: pleasant   Assessment/Plan: 1. Functional deficits secondary to Left Basal ganglia ICH  which require 3+ hours per day of interdisciplinary therapy in a comprehensive inpatient rehab setting.  Physiatrist is providing close team supervision and 24 hour management of active medical problems listed below.  Physiatrist and rehab team continue to  assess barriers to discharge/monitor patient progress toward functional and medical goals  Care Tool:  Bathing    Body parts bathed by patient: Right arm, Left arm, Chest, Abdomen, Front perineal area, Buttocks, Left lower leg, Right lower leg, Left upper leg, Right upper leg, Face         Bathing assist Assist Level: Supervision/Verbal cueing     Upper Body Dressing/Undressing Upper body dressing   What is the patient wearing?: Pull over shirt    Upper body assist Assist Level: Set up assist    Lower Body Dressing/Undressing Lower body dressing      What is the patient wearing?: Pants, Underwear/pull up     Lower body assist Assist for lower body dressing: Supervision/Verbal cueing     Toileting Toileting    Toileting assist Assist for toileting: Supervision/Verbal cueing     Transfers Chair/bed transfer  Transfers assist     Chair/bed transfer assist level: Contact Guard/Touching assist     Locomotion Ambulation   Ambulation assist      Assist level: Supervision/Verbal cueing Assistive device: Walker-rolling Max distance: 260ft   Walk 10 feet activity   Assist     Assist level: Supervision/Verbal cueing Assistive device: Walker-rolling   Walk 50 feet activity   Assist Walk 50 feet with 2 turns activity did not occur: Safety/medical concerns  Assist level: Supervision/Verbal cueing Assistive device: Walker-rolling    Walk 150 feet activity   Assist Walk 150 feet activity did not occur: Safety/medical concerns  Assist level: Supervision/Verbal cueing Assistive device: Walker-rolling    Walk 10 feet on  uneven surface  activity   Assist Walk 10 feet on uneven surfaces activity did not occur: Safety/medical concerns         Wheelchair     Assist Will patient use wheelchair at discharge?: No      Wheelchair assist level: Supervision/Verbal cueing Max wheelchair distance: 134ft     Wheelchair 50 feet with 2 turns  activity    Assist        Assist Level: Supervision/Verbal cueing   Wheelchair 150 feet activity     Assist     Assist Level: Supervision/Verbal cueing    Medical Problem List and Plan: 1.Decreased functional mobilitysecondary to small left basal ganglia ICH likely due to hypertensive crisis -CIR PT, OT , ELOS 4/28 Discussed discharge date, he agrees  2. Antithrombotics: -DVT/anticoagulation:SCDs -antiplatelet therapy: N/A 3. Pain Management:Tylenol as needed 4. Mood:Celexa 20 mg daily -antipsychotic agents: N/A 5. Neuropsych: This patientiscapable of making decisions on hisown behalf. 6. Skin/Wound Care:Routine skin checks,    7. Fluids/Electrolytes/Nutrition:BMET 4/14 borderline low Na+, mild elevation glucose (? Fasting) 8. Hypertension. Avapro 300 mg daily, Lopressor 25 mg twice a day. Monitoras he becomes more active Vitals:   12/25/18 1939 12/26/18 0455  BP: (!) 108/55 (!) 142/79  Pulse: 60 62  Resp: 17 18  Temp: (!) 97.5 F (36.4 C) 98 F (36.7 C)  SpO2: 98% 82%  goal of systolic <800, max dose irbesartan, and would not increase metoprolol due to bradycardia,    -Added low dose amlodipine 10mg , well controlled, no further changes for now 9. Hypothyroidism. Synthroid. Latest TSH 2.080 10.History of ascending aortic aneurysm repair 2015. Follow-up outpatient 11. GERD. Protonix 12. BPH. Monitor voiding pattern  13. CBC normalized 4/14,no repeat needed 14.  Patient reported bowel incontinence with loose stools, reviewed bowel pattern,no incontinence reported.  D/c  Senokot , add home med metamucil to bulk stool, pt has refused and instead drank prune juice which "worked just fine" LOS:  13 days A FACE TO [place] E Danny Frederick 12/26/2018, 9:19 AM

## 2018-12-27 ENCOUNTER — Inpatient Hospital Stay (HOSPITAL_COMMUNITY): Payer: Self-pay

## 2018-12-27 ENCOUNTER — Encounter: Payer: Self-pay | Admitting: Family Medicine

## 2018-12-27 ENCOUNTER — Inpatient Hospital Stay (HOSPITAL_COMMUNITY): Payer: Self-pay | Admitting: Occupational Therapy

## 2018-12-27 NOTE — Progress Notes (Signed)
Social Work  Discharge Note  The overall goal for the admission was met for:   Discharge location: [place] TO ASSIST AT DC  Length of Stay: Yes-15 DAYS  Discharge activity level: Yes-SUPERVISION LEVEL  Home/community participation: Yes  Services provided included: MD, RD, PT, OT, SLP, RN, CM, TR, Pharmacy, Neuropsych and SW  Financial Services: Medicare and Private Insurance: [employer]  Follow-up services arranged: Home Health: [place] and Patient/Family request agency HH: PREF HAD BEFORE, DME: NO NEEDS  Comments (or additional information):WIFE AND DAUGHTER [place]. AWARE PT WILL NEED SUPERVISION LEVEL DUE TO SHUFFLES AT TIMES. PT MOVING BETTER THAN PRIOR TO CVA ACCORDING TO DAUGHTER.  Patient/Family verbalized understanding of follow-up arrangements: Yes  Individual responsible for coordination of the follow-up plan: SHARRON-DAUGHTER  Confirmed correct DME delivered: Elease Hashimoto 12/27/2018    Elease Hashimoto

## 2018-12-27 NOTE — Progress Notes (Signed)
Occupational Therapy Session Note  Patient Details  Name: Danny Frederick MRN: [id number] Date of Birth: [birth year]  Today's Date: 12/27/2018 OT Individual Time: 1100-1200 OT Individual Time Calculation (min): 60 min    Short Term Goals: Week 2:  OT Short Term Goal 1 (Week 2): Continue working on established LTGs downgraded to supervision level overall.   Skilled Therapeutic Interventions/Progress Updates:    Completed ADL retraining at overall Mod I to supervision level.  Pt ambulated around room with RW with supervision to gather clothing and completed bathing/dressing at Mod I level when seated and supervision for standing balance when completing perineal hygiene and clothing.  Pt demonstrating improved balance reactions and functional use of dominant RUE.  Provided pt with fine motor control HEP and educated on continued NMR for fine and gross motor control. Completed 9 hole peg test Lt: 36 seconds and Rt: 1:12.  Pt reports he does not plan to shower at home as that "scared" him even prior to stroke.  Pt left upright in recliner with chair alarm at end of session.  Therapy Documentation Precautions:  Precautions Precautions: Fall Precaution Comments:   Restrictions Weight Bearing Restrictions: No Pain:  Pt with no c/o pain   Therapy/Group: Individual Therapy  Simonne Come 12/27/2018, 12:11 PM

## 2018-12-27 NOTE — Progress Notes (Signed)
Physical Therapy Discharge Summary  Patient Details  Name: Danny Frederick MRN: [id number] Date of Birth: 1934/06/10  Today's Date: 12/27/2018 PT Individual Time: 0800-0900 and 1331-1445 PT Individual Time Calculation (min): 60 min and 74 minutes   Patient has met 7 of 7 long term goals due to improved activity tolerance, improved balance, improved postural control and ability to compensate for deficits.  Patient to discharge at an ambulatory level Supervision.   Patient's care partner is independent to provide the necessary supervision assistance at discharge. Pt's wife and daughter will provide supervision upon d/c.   All goals met.   Recommendation:  Patient will benefit from ongoing skilled PT services in home health setting to continue to advance safe functional mobility, address ongoing impairments in balance, coordination, and minimize fall risk.  Equipment: RW  Reasons for discharge: treatment goals met  Patient/family agrees with progress made and goals achieved: Yes   PT Treatment Interventions: Session 1: Pt supine in bed upon PT arrival, agreeable to therapy tx and denies pain. Pt performed bed mobility independently and ambulated into bathroom with RW and supervision. Pt used bathroom and donned clean pants, briefs with supervision. Pt seated EOB donned socks and shoes. Pt ambulated to the gym x 200 ft with supervision. Pt participated in berg balance test this session as detailed below, scored 33/56 and discussed these results with the pt. Educated pt on using RW at all times as he is a high fall risk. Pt performed floor transfer this session with CGA and education on what to do in case of a fall, education on how to get back up safely and when to call for help. Pt performed seated hamstring stretch this session, educated on performing this at least once per day at home with handout provided. Pt ambulated back to room with RW and supervision x 200 ft. Pt left in recliner with  needs in reach and chair alarm set.   Session 2: Pt seated in recliner upon PT arrival, agreeable to therapy tx and denies pain. Pt ambulated from room>gym x 200 ft with supervision and RW. Pt ascended/descended 12 steps this session with B rails and supervision, reciprocal pattern. Pt ambulated gym<>ortho gym this session 2 x 120 ft with RW and supervision, performed ambulatory car transfer this session with RW and supervision. Pt ambulated to the dayroom x 200 ft with RW and supervision. Pt used nustep x 5 minutes this session on workload 5 for global strengthening and endurance. Pt ambulated to high table, worked on standing balance and heel cord stretching while standing on red wedge to engage in game of connect 4. PT worked on dynamic standing balance this session without UE support to performed forwards ambulation, backwards walking and sidestepping in each direction. Reviewed pt's HEP this session, had pt read handout and perform each exercise on HEP as he would at home: hamstring stretch, supine bridges, quadruped and static standing balance. Pt ambulated back to room and left supine in bed with needs in reach and bed alarm set.     PT Discharge Precautions/Restrictions Precautions Precautions: Fall Restrictions Weight Bearing Restrictions: No Vital Signs Therapy Vitals Temp: 97.8 F (36.6 C) Temp Source: Oral Pulse Rate: (!) 58 Resp: 18 BP: (!) 164/83 Patient Position (if appropriate): Lying Oxygen Therapy SpO2: 97 % O2 Device: Room Air Pain Pain Assessment Pain Scale: 0-10 Pain Score: 0-No pain Cognition Overall Cognitive Status: Within Functional Limits for tasks assessed Arousal/Alertness: Awake/alert Orientation Level: Oriented X4 Attention: Sustained Sustained Attention: Appears intact  Selective Attention: Appears intact Safety/Judgment: Appears intact Sensation Sensation Light Touch: Impaired Detail(snesation intact BLEs, R UE decreased sensation compared to L UE  able to detect light touch) Light Touch Impaired Details: Impaired RUE Proprioception: Impaired Detail Proprioception Impaired Details: Impaired RUE Coordination Gross Motor Movements are Fluid and Coordinated: No Fine Motor Movements are Fluid and Coordinated: No Coordination and Movement Description: coordination impaired R UE Motor  Motor Motor: Hemiplegia;Ataxia Motor - Discharge Observations: Pt still with decreased coordination in the RUE and RLE  Mobility Bed Mobility Bed Mobility: Rolling Right;Rolling Left;Supine to Sit;Sit to Supine Rolling Right: Independent Rolling Left: Independent Supine to Sit: Independent Sit to Supine: Independent Transfers Transfers: Sit to Stand;Stand to Sit;Stand Pivot Transfers Sit to Stand: Supervision/Verbal cueing Stand to Sit: Supervision/Verbal cueing Stand Pivot Transfers: Supervision/Verbal cueing Stand Pivot Transfer Details: Verbal cues for technique;Verbal cues for precautions/safety Transfer (Assistive device): Rolling walker Locomotion  Gait Ambulation: Yes Gait Assistance: Supervision/Verbal cueing Gait Distance (Feet): 200 Feet Assistive device: Rolling walker Gait Assistance Details: Verbal cues for technique;Verbal cues for precautions/safety Gait Gait: Yes Gait Pattern: Impaired Gait velocity: decreased Stairs / Additional Locomotion Stairs: Yes Stairs Assistance: Supervision/Verbal cueing Stair Management Technique: Two rails Number of Stairs: 12 Height of Stairs: 6 Wheelchair Mobility Wheelchair Mobility: No  Trunk/Postural Assessment  Cervical Assessment Cervical Assessment: Exceptions to WFL(forward head) Thoracic Assessment Thoracic Assessment: Exceptions to WFL(kyphotic) Lumbar Assessment Lumbar Assessment: Exceptions to WFL(posterior pelvic tilt) Postural Control Postural Control: Within Functional Limits Protective Responses: decreased posterior protective responses, impaired balance strategies   Balance Balance Balance Assessed: Yes Standardized Balance Assessment Standardized Balance Assessment: Berg Balance Test Berg Balance Test Sit to Stand: Able to stand without using hands and stabilize independently Standing Unsupported: Able to stand 2 minutes with supervision Sitting with Back Unsupported but Feet Supported on Floor or Stool: Able to sit safely and securely 2 minutes Stand to Sit: Sits safely with minimal use of hands Transfers: Able to transfer safely, definite need of hands Standing Unsupported with Eyes Closed: Able to stand 10 seconds with supervision Standing Ubsupported with Feet Together: Needs help to attain position and unable to hold for 15 seconds From Standing, Reach Forward with Outstretched Arm: Reaches forward but needs supervision From Standing Position, Pick up Object from Floor: Able to pick up shoe, needs supervision From Standing Position, Turn to Look Behind Over each Shoulder: Turn sideways only but maintains balance Turn 360 Degrees: Able to turn 360 degrees safely but slowly Standing Unsupported, Alternately Place Feet on Step/Stool: Able to complete >2 steps/needs minimal assist Standing Unsupported, One Foot in Front: Able to take small step independently and hold 30 seconds Standing on One Leg: Tries to lift leg/unable to hold 3 seconds but remains standing independently Total Score: 33 Extremity Assessment  RLE Assessment RLE Assessment: Within Functional Limits LLE Assessment LLE Assessment: Within Functional Limits    Netta Corrigan, PT, DPT 12/27/2018, 8:23 AM

## 2018-12-27 NOTE — Discharge Summary (Signed)
Physician Discharge Summary  Patient ID: Danny Frederick MRN: [id number] DOB/AGE: [birth year] 83 y.o.  Admit date: 12/13/2018 Discharge date: 12/28/2018  Discharge Diagnoses:  Active Problems:   ICH (intracerebral hemorrhage) (HCC) - L BG DVT prophylaxis Hypertension Hypothyroidism History of ascending aortic aneurysm repair 2015 GERD BPH  Discharged Condition: stable  Significant Diagnostic Studies: Ct Head Wo Contrast  Result Date: 12/11/2018 CLINICAL DATA:  Follow-up intracranial hemorrhage. EXAM: CT HEAD WITHOUT CONTRAST TECHNIQUE: Contiguous axial images were obtained from the base of the skull through the vertex without intravenous contrast. COMPARISON:  12/11/2018 FINDINGS: Brain: A 15 x 11 x 10 mm hematoma involving the left thalamus and posterior limb of the left internal capsule is unchanged in size with an estimated volume of 0.8 mL. There is mild surrounding edema without significant mass effect. No new hemorrhage, acute large territory infarct, or extra-axial fluid collection is identified. Cerebral atrophy is not greater than expected for age. Patchy to confluent cerebral white matter hypodensities are unchanged and nonspecific but compatible with extensive chronic small vessel ischemic disease. There are chronic lacunar infarcts in the basal ganglia bilaterally Vascular: Calcified atherosclerosis at the skull base. No hyperdense vessel. Skull: No fracture or suspicious osseous lesion. Sinuses/Orbits: Paranasal sinuses and mastoid air cells are clear. Unremarkable orbits. Other: Partially visualized fatty replacement of the left greater than right parotid glands. IMPRESSION: 1. Unchanged left thalamic hemorrhage. 2. Extensive chronic small vessel ischemic disease. Electronically Signed   By: Logan Bores M.D.   On: 12/11/2018 17:25   Ct Head Wo Contrast  Result Date: 12/11/2018 CLINICAL DATA:  Acute onset of RIGHT UPPER extremity numbness that began this morning. Current history  of hypertension. Personal history of ascending aortic aneurysm repair in 2015. EXAM: CT HEAD WITHOUT CONTRAST TECHNIQUE: Contiguous axial images were obtained from the base of the skull through the vertex without intravenous contrast. COMPARISON:  11/24/2018 and earlier. FINDINGS: Brain: Approximately 1.4 x 1.2 cm acute bleed involving the POSTERIOR LEFT basal ganglia. No associated midline shift. No acute hemorrhage or hematoma elsewhere. Moderate age related cortical and deep atrophy, unchanged dating back to 85. Severe changes of small vessel disease of the white matter diffusely, progressive since 2013. No extra-axial fluid collections. Vascular: Severe BILATERAL carotid siphon and severe RIGHT vertebral artery atherosclerosis. No hyperdense vessel. Skull: No skull fracture or other focal osseous abnormality involving the skull. Sinuses/Orbits: Visualized paranasal sinuses, bilateral mastoid air cells and bilateral middle ear cavities well-aerated. Benign scleral calcifications involving the LEFT globe. Normal-appearing RIGHT globe. Other: None. IMPRESSION: 1. Acute hemorrhagic stroke/hypertensive hemorrhage involving the POSTERIOR LEFT basal ganglia. No associated midline shift. 2. Moderate age related generalized atrophy and severe chronic microvascular ischemic changes of the white matter. I personally telephoned these critical/emergent results to Dr. Regenia Skeeter of the emergency department at the time of interpretation on 12/11/2018 11:35 a.m. Electronically Signed   By: Evangeline Dakin M.D.   On: 12/11/2018 11:37   Dg Chest Portable 1 View  Result Date: 12/11/2018 CLINICAL DATA:  85 year old with acute onset of RIGHT-sided weakness and difficulty walking that began upon awakening this morning. CT head earlier same day demonstrated a hypertensive hemorrhage/hemorrhagic stroke involving the LEFT basal ganglia. Personal history of ascending thoracic aortic aneurysm repair in 2015. EXAM: PORTABLE CHEST 1  VIEW COMPARISON:  08/11/2018 and earlier, including CTA chest 03/08/2018 and previously. FINDINGS: Prior sternotomy. Cardiac silhouette mildly enlarged for AP portable technique, unchanged. Fibrosis involving the lung bases, LEFT greater than RIGHT, unchanged since the 08/11/2018 examination. Lungs otherwise  clear. Pulmonary vascularity normal. No visible pleural effusions. Degenerative changes involving both shoulder joints as noted previously. IMPRESSION: 1. No acute cardiopulmonary disease. 2. Stable mild cardiomegaly without pulmonary edema. 3. Stable fibrosis involving the lung bases, LEFT greater than RIGHT. Electronically Signed   By: Evangeline Dakin M.D.   On: 12/11/2018 14:45    Labs:  Basic Metabolic Panel: No results for input(s): NA, K, CL, CO2, GLUCOSE, BUN, CREATININE, CALCIUM, MG, PHOS in the last 168 hours.  CBC: No results for input(s): WBC, NEUTROABS, HGB, HCT, MCV, PLT in the last 168 hours.  CBG: No results for input(s): GLUCAP in the last 168 hours.  Family history. Mother with diabetes, CAD and CHF as well as hypertension. Father with CVA parkinsonian and hypertension. Brother with bladder cancer as well as prostate cancer. Sister with diabetes  Brief HPI:    Danny Frederick is an 85 year old right-handed male history of hypertension, BPH as well as a sending aortic aneurysm repair 2015. Lives with spouse independent prior to admission. Presented for 07/22/2019 with unsteady gait and dizziness and numbness of right side. Reports of fall 3 weeks ago. Blood pressure 220/100. CT scan showed acute hemorrhage stroke hypertensive involving the posterior left basal ganglia no associated midline shift. Troponin negative. Echocardiogram with ejection fraction of 43% normal systolic function. Neurology follow-up [place] felt most likely related to hypertensive episode. Tolerating a regular diet. Patient was admitted for a comprehensive rehabilitation program  Hospital Course: Danny Frederick was admitted to rehab 12/13/2018 for inpatient therapies to consist of PT, ST and OT at least three hours five days a week. Past admission physiatrist, therapy team and rehab RN have worked together to provide customized collaborative inpatient rehab.pertaining to patient left basal ganglia ICH felt likely related to hypertensive crisis. Blood pressures monitored and patient would follow up with neurology services. DVT prophylaxis with SCDs no signs of DVT.blood pressures controlled with Norvasc 10 mg daily, Avapro 300 mg daily and Lopressor 25 mg twice a day. Patient again was arranged for follow-up with PCP. Mood stabilization with Celexa patient was attending full therapies and emotional support was provided. Hyperlipidemia with Lipitor ongoing..bouts of constipation resolved with laxative assistance. Patient remained on Protonix for history of GERD. Hypothyroidism with Synthroid as directed.  Physical exam Blood pressure 160/85 pulse 63 tension 98 respirations 16 oxygen saturations 99% room air Constitutional. No distress HEENT Head. Normocephalic and atraumatic Eyes. Pupils round and reactive to light EOMs normal no discharge without nystagmus Neck. Supple nontender normal range of motion no tracheal deviation no JVD Cardiovascular normal rate and rhythm without friction rub or murmur heard Respiratory. Effort normal no respiratory distress without wheeze GI. Soft exhibits no distention nontender without rebound Musculoskeletal normal range of motion no deformity or edema Neurological. Patient alert and makes good eye contact follows commands slightly dysarthric normal language reasonable insight and awareness. Right upper extremity 4 out of 5 proximal to distal right lower extremity 45 proximal to distal  Rehab course: During patient's stay in rehab weekly team conferences were held to monitor patient's progress, set goals and discuss barriers to discharge. At admission, patient  required minimal guard supine to sit, moderate assist sit to stand, moderate assist to ambulate pivot steps. Minimal assist upper body bathing moderate assist lower body bathing, moderate assist upper body dressing, max assist lower body dressing  He  has had improvement in activity tolerance, balance, postural control as well as ability to compensate for deficits. He/She has had improvement in functional use  RUE/LUE  and RLE/LLE as well as improvement in awareness.patient ambulates to the rehabilitation gym with rolling walker contact-guard assist 150 feet occasional cues for rolling walker management. Working with standing balance on a wedge. Performed clothing management with supervision ambulates to the sink with rolling walker to wash his hands. Up and down stairs contact-guard assist bilateral rails. He could increase his ambulation up to 200 feet with ambulation training. Perform sit to stand with supervision. Patient could gather his belongings for ADLs. Family teaching completed and plan discharge to home.       Disposition: discharged home   Diet:regular  Special Instructions:no driving smoking or alcohol  Medications at discharge. 1. Tylenol as needed 2. Norvasc 10 mg by mouth daily 3. Lipitor 10 mg daily at bedtime 4. Vitamin D 2000 units daily 5. Celexa 20 mg by mouth daily 6. Ferrous sulfate 325 mg every 48 hours 7. Avapro 300 mg by mouth daily 8. Synthroid 50 g by mouth daily 9.Claritin 10 mg by mouth daily 10. Lopressor 25 mg by mouth twice a day 11. Protonix 40 mg by mouth daily 12. Metamucil 1 packet daily  Discharge Instructions    Ambulatory referral to Neurology   Complete by:  As directed    An appointment is requested in approximately 4 weeks I[facility] related to hypertensive crisis   Ambulatory referral to Physical Medicine Rehab   Complete by:  As directed    Moderate complexity follow-up 1-2 weeks ICH      Follow-up Information    Kirsteins, Luanna Salk, MD  Follow up.   Specialty:  Physical Medicine and Rehabilitation Why:  office to call for appointment Contact information: [place] [address] [phone]           Signed: Cathlyn Parsons 12/27/2018, 5:32 AM

## 2018-12-27 NOTE — Progress Notes (Signed)
[place] PHYSICAL MEDICINE & REHABILITATION PROGRESS NOTE   Subjective/Complaints:  Seen in therapy walking with RW  In PT  ROS: Patient denies CP, SOB, N/V/D Objective:   No results found. No results for input(s): WBC, HGB, HCT, PLT in the last 72 hours. No results for input(s): NA, K, CL, CO2, GLUCOSE, BUN, CREATININE, CALCIUM in the last 72 hours.  Intake/Output Summary (Last 24 hours) at 12/27/2018 0903 Last data filed at 12/27/2018 0730 Gross per 24 hour  Intake 480 ml  Output 1534 ml  Net -1054 ml     Physical Exam: Vital Signs Blood pressure (!) 164/83, pulse (!) 58, temperature 97.8 F (36.6 C), temperature source Oral, resp. rate 18, height 5\' 4"  (1.626 m), weight 60.2 kg, SpO2 97 %.  Constitutional: No distress . Vital signs reviewed. HEENT: EOMI, oral membranes moist Neck: supple Cardiovascular: RRR without murmur. No JVD    Respiratory: CTA Bilaterally without wheezes or rales. Normal effort    GI: BS +, non-tender, non-distended  No masses Extremities: No clubbing, cyanosis, or edema Skin: No evidence of breakdown, no evidence of rash Neurologic: Cranial nerves II through XII intact, motor strength is 5/5 in Left  deltoid, bicep, tricep, grip, hip flexor, knee extensors, ankle dorsiflexor and plantar flexor, 4/5 on right  Sensory exam normal sensation to light touch and proprioception in bilateral upper and lower extremities Cerebellar exam normal left  finger to nose to finger as well as heel to shin in bilateral upper and lower extremities Right side has moderate UE  with FTN. Musculoskeletal: full ROM , no pain Psych: pleasant   Assessment/Plan: 1. Functional deficits secondary to Left Basal ganglia ICH  which require 3+ hours per day of interdisciplinary therapy in a comprehensive inpatient rehab setting.  Physiatrist is providing close team supervision and 24 hour management of active medical problems listed below.  Physiatrist and rehab team  continue to assess barriers to discharge/monitor patient progress toward functional and medical goals  Care Tool:  Bathing    Body parts bathed by patient: Right arm, Left arm, Chest, Abdomen, Front perineal area, Buttocks, Left lower leg, Right lower leg, Left upper leg, Right upper leg, Face         Bathing assist Assist Level: Supervision/Verbal cueing     Upper Body Dressing/Undressing Upper body dressing   What is the patient wearing?: Pull over shirt    Upper body assist Assist Level: Set up assist    Lower Body Dressing/Undressing Lower body dressing      What is the patient wearing?: Pants, Underwear/pull up     Lower body assist Assist for lower body dressing: Supervision/Verbal cueing     Toileting Toileting    Toileting assist Assist for toileting: Supervision/Verbal cueing     Transfers Chair/bed transfer  Transfers assist     Chair/bed transfer assist level: Supervision/Verbal cueing     Locomotion Ambulation   Ambulation assist      Assist level: Contact Guard/Touching assist Assistive device: Walker-rolling Max distance: 249ft   Walk 10 feet activity   Assist     Assist level: Contact Guard/Touching assist Assistive device: Walker-rolling   Walk 50 feet activity   Assist Walk 50 feet with 2 turns activity did not occur: Safety/medical concerns  Assist level: Supervision/Verbal cueing Assistive device: Walker-rolling    Walk 150 feet activity   Assist Walk 150 feet activity did not occur: Safety/medical concerns  Assist level: Supervision/Verbal cueing Assistive device: Walker-rolling    Walk 10  feet on uneven surface  activity   Assist Walk 10 feet on uneven surfaces activity did not occur: Safety/medical concerns         Wheelchair     Assist Will patient use wheelchair at discharge?: No      Wheelchair assist level: Supervision/Verbal cueing Max wheelchair distance: 11ft     Wheelchair 50 feet  with 2 turns activity    Assist        Assist Level: Supervision/Verbal cueing   Wheelchair 150 feet activity     Assist     Assist Level: Supervision/Verbal cueing    Medical Problem List and Plan: 1.Decreased functional mobilitysecondary to small left basal ganglia ICH likely due to hypertensive crisis -CIR PT, OT , ELOS 4/28 Discussed discharge date, he agrees  2. Antithrombotics: -DVT/anticoagulation:SCDs -antiplatelet therapy: N/A 3. Pain Management:Tylenol as needed 4. Mood:Celexa 20 mg daily -antipsychotic agents: N/A 5. Neuropsych: This patientiscapable of making decisions on hisown behalf. 6. Skin/Wound Care:Routine skin checks,    7. Fluids/Electrolytes/Nutrition:BMET 4/14 borderline low Na+, mild elevation glucose (? Fasting) 8. Hypertension. Avapro 300 mg daily, Lopressor 25 mg twice a day. Monitoras he becomes more active Vitals:   12/27/18 0506 12/27/18 0755  BP: (!) 182/89 (!) 164/83  Pulse: 60 (!) 58  Resp: 18   Temp: 97.8 F (36.6 C)   SpO2: 36%   goal of systolic <144, max dose irbesartan, and would not increase metoprolol due to bradycardia,    -Added low dose amlodipine 10mg , well controlled, no further changes for now 9. Hypothyroidism. Synthroid. Latest TSH 2.080 10.History of ascending aortic aneurysm repair 2015. Follow-up outpatient 11. GERD. Protonix 12. BPH. Monitor voiding pattern  13. CBC normalized 4/14,no repeat needed 14.  Patient reported bowel incontinence with loose stools, reviewed bowel pattern,no incontinence reported.  D/c  Senokot , add home med metamucil to bulk stool, pt has refused and instead drank prune juice which "worked just fine" LOS:  14 days A FACE TO [place] E Carrell Rahmani 12/27/2018, 9:03 AM

## 2018-12-27 NOTE — Progress Notes (Signed)
Physical Therapy Session Note  Patient Details  Name: Danny Frederick MRN: [id number] Date of Birth: 07-Jul-1934  Today's Date: 12/27/2018 PT Individual Time: 7425-9563 PT Individual Time Calculation (min): 42 min   Short Term Goals: Week 2:  PT Short Term Goal 1 (Week 2): STG=LTG due to ELOS  Skilled Therapeutic Interventions/Progress Updates:    Patient seated in room in recliner.  Reports planned d/c tomorrow.  Performed most of d/c assessment with primary PT this morning.  Patient sit to stand with S.  Ambulated 200' with RW to therapy gym.  Performed various balance/coordination activities in parallel bars including side stepping and forward stepping over cones with UE support and CGA cues for high stepping, standing alternating taps to step, tap downs from 4"step x 5 on each side, single limb stance while circling ball under foot x 10 with 1 UE support.  Standing without UE support to toss ball to self.  Patient with rest breaks in between activities.  Ambulated to room with RW and S.  Left in recliner with alarm activated and needs/call bell in reach.  Therapy Documentation Precautions:  Precautions Precautions: Fall Precaution Comments:   Restrictions Weight Bearing Restrictions: No Pain: Pain Assessment Pain Score: 0-No pain    Therapy/Group: Individual Therapy  Reginia Naas  Magda Kiel, PT 12/27/2018, 12:48 PM

## 2018-12-28 MED ORDER — ATORVASTATIN CALCIUM 10 MG PO TABS: 10.0000 mg | ORAL_TABLET | Freq: Every day | ORAL | 0 refills | Status: DC

## 2018-12-28 MED ORDER — AMLODIPINE BESYLATE 10 MG PO TABS: 10.0000 mg | ORAL_TABLET | Freq: Every day | ORAL | 1 refills | Status: DC

## 2018-12-28 MED ORDER — IRBESARTAN 300 MG PO TABS: 300.0000 mg | ORAL_TABLET | Freq: Every day | ORAL | 3 refills | Status: DC

## 2018-12-28 MED ORDER — SYNTHROID 50 MCG PO TABS: 50.0000 ug | ORAL_TABLET | Freq: Every day | ORAL | 0 refills | Status: DC

## 2018-12-28 MED ORDER — CITALOPRAM HYDROBROMIDE 20 MG PO TABS: 20.0000 mg | ORAL_TABLET | Freq: Every day | ORAL | 1 refills | Status: DC

## 2018-12-28 MED ORDER — ACETAMINOPHEN 325 MG PO TABS: 650.0000 mg | ORAL_TABLET | ORAL | Status: DC | PRN

## 2018-12-28 MED ORDER — METOPROLOL TARTRATE 25 MG PO TABS: 25.0000 mg | ORAL_TABLET | Freq: Two times a day (BID) | ORAL | 3 refills | Status: DC

## 2018-12-28 MED ORDER — IRON 325 (65 FE) MG PO TABS: 325.0000 mg | ORAL_TABLET | ORAL | 0 refills | Status: AC

## 2018-12-28 NOTE — Progress Notes (Signed)
   12/28/18 1045  What Happened  Was fall witnessed? Yes  Who witnessed fall? Scot Dock NT  Patients activity before fall ambulating-assisted  Point of contact buttocks  Was patient injured? No  Follow Up  MD notified Silvestre Mesi  Time MD notified 1040  Family notified Yes-comment  Time family notified 1055  Additional tests No  Progress note created (see row info) Yes  Adult Fall Risk Assessment  Risk Factor Category (scoring not indicated) Fall has occurred during this admission (document High fall risk)  Patient Fall Risk Level High fall risk  Adult Fall Risk Interventions  Required Bundle Interventions *See Row Information* High fall risk - low, moderate, and high requirements implemented  Additional Interventions Other (Comment) (Pt discharging. Pt educated)  Screening for Fall Injury Risk (To be completed on HIGH fall risk patients) - Assessing Need for Low Bed  Risk For Fall Injury- Low Bed Criteria None identified - Continue screening  Will Implement Low Bed and Floor Mats No - Criteria no longer met for low bed  Screening for Fall Injury Risk (To be completed on HIGH fall risk patients who do not meet crieteria for Low Bed) - Assessing Need for Floor Mats Only  Risk For Fall Injury- Criteria for Floor Mats None identified - No additional interventions needed  Vitals  Temp 98.4 F (36.9 C)  BP 126/62  BP Location Right Arm  BP Method Automatic  Patient Position (if appropriate) Sitting  Pulse Rate 67  Pulse Rate Source Dinamap  Resp 14  Oxygen Therapy  SpO2 100 %  O2 Device Room Air  Pain Assessment  Pain Scale 0-10  Pain Score 0  Neurological  Neuro (WDL) WDL  Level of Consciousness Alert  Orientation Level Oriented X4  Cognition Appropriate at baseline  R Hand Grip Strong  L Hand Grip Strong  RUE Motor Response Purposeful movement  RUE Sensation Full sensation  RUE Motor Strength 4  LUE Motor Response Purposeful movement  LUE Sensation Full sensation   LUE Motor Strength 5  RLE Motor Response Purposeful movement  RLE Sensation Full sensation  RLE Motor Strength 4  LLE Motor Response Purposeful movement  LLE Sensation Full sensation  LLE Motor Strength 5  Neuro Symptoms None  Musculoskeletal  Musculoskeletal (WDL) X  Assistive Device Wheelchair  Generalized Weakness Yes  Weight Bearing Restrictions No  Integumentary  Integumentary (WDL) WDL   Pt ambulating assisted with NT back from restroom and began to feel weak and was assisted to the floor. Pt was assisted back to bed and PA was notified. Pt reported that he had been having a bowel movement prior to transferring back to wheelchair. Pt instructed to sit on edge of bed and get bearings. BP returned to normal and pt had no injuries. Pt was cleared to discharge home and pt was transported by RN to front entrance. Pt transferred to car with no difficulties.

## 2018-12-29 ENCOUNTER — Telehealth: Payer: Self-pay | Admitting: Cardiology

## 2018-12-29 ENCOUNTER — Telehealth: Payer: Self-pay | Admitting: Internal Medicine

## 2018-12-29 DIAGNOSIS — E039 Hypothyroidism, unspecified: Secondary | ICD-10-CM | POA: Diagnosis not present

## 2018-12-29 DIAGNOSIS — I10 Essential (primary) hypertension: Secondary | ICD-10-CM | POA: Diagnosis not present

## 2018-12-29 DIAGNOSIS — I69151 Hemiplegia and hemiparesis following nontraumatic intracerebral hemorrhage affecting right dominant side: Secondary | ICD-10-CM | POA: Diagnosis not present

## 2018-12-29 DIAGNOSIS — Z9181 History of falling: Secondary | ICD-10-CM | POA: Diagnosis not present

## 2018-12-29 NOTE — Telephone Encounter (Signed)
[place] [facility] [place]

## 2018-12-29 NOTE — Telephone Encounter (Signed)
New message   Per Amy need verbal orders physical therapy twice a week for 4 weeks. Please call.

## 2018-12-29 NOTE — Telephone Encounter (Signed)
Tried to call pt to let him know that he needs to follow-up with cardiology for hospital follow-up as Dr. Tomi Bamberger is going to be out of the office.   Phone is not going through

## 2018-12-29 NOTE — Telephone Encounter (Signed)
Left message for Danny Frederick, verbal order given.

## 2018-12-30 ENCOUNTER — Other Ambulatory Visit: Payer: Self-pay

## 2018-12-30 ENCOUNTER — Encounter: Payer: Medicare Other | Attending: Registered Nurse | Admitting: Registered Nurse

## 2018-12-30 ENCOUNTER — Encounter: Payer: Self-pay | Admitting: Registered Nurse

## 2018-12-30 ENCOUNTER — Telehealth: Payer: Self-pay | Admitting: *Deleted

## 2018-12-30 DIAGNOSIS — I6381 Other cerebral infarction due to occlusion or stenosis of small artery: Secondary | ICD-10-CM

## 2018-12-30 DIAGNOSIS — Z9181 History of falling: Secondary | ICD-10-CM | POA: Diagnosis not present

## 2018-12-30 DIAGNOSIS — E039 Hypothyroidism, unspecified: Secondary | ICD-10-CM | POA: Diagnosis not present

## 2018-12-30 DIAGNOSIS — E7849 Other hyperlipidemia: Secondary | ICD-10-CM

## 2018-12-30 DIAGNOSIS — I69151 Hemiplegia and hemiparesis following nontraumatic intracerebral hemorrhage affecting right dominant side: Secondary | ICD-10-CM | POA: Diagnosis not present

## 2018-12-30 DIAGNOSIS — I639 Cerebral infarction, unspecified: Secondary | ICD-10-CM | POA: Diagnosis not present

## 2018-12-30 DIAGNOSIS — I10 Essential (primary) hypertension: Secondary | ICD-10-CM

## 2018-12-30 NOTE — Telephone Encounter (Signed)
Patient's daughter called and stated that dad wanted to see if we could send a 90 day rx of his new med amlodipine to [facility]? They have the 30 day one but would like to start the 90 day process.

## 2018-12-30 NOTE — Telephone Encounter (Signed)
Patient advised and was agreeable.

## 2018-12-30 NOTE — Progress Notes (Signed)
Transitional Care call Transitional Questions Answered by Ms. Ivin Booty ( Daughter_  Patient name: Danny Frederick DOB: [birth year]  1. Are you/is patient experiencing any problems since coming home? No a. Are there any questions regarding any aspect of care? No 2. Are there any questions regarding medications administration/dosing? Ivin Booty states she wasn't aware of his Norvasc until the physical therapist reviewed medication with them on 12/29/2018. He is now on all medications as prescribed. a. Are meds being taken as prescribed? Yes b. "Patient should review meds with caller to confirm" Medication List Reviewed. 3. Have there been any falls? No 4. Has Home Health been to the house and/or have they contacted you? Yes, [facility]. a. If not, have you tried to contact them? NA b. Can we help you contact them? NA 5. Are bowels and bladder emptying properly? Yes a. Are there any unexpected incontinence issues? No b. If applicable, is patient following bowel/bladder programs? NA 6. Any fevers, problems with breathing, unexpected pain? No 7. Are there any skin problems or new areas of breakdown? No 8. Has the patient/family member arranged specialty MD follow up (ie cardiology/neurology/renal/surgical/etc.)?  Ms. Ivin Booty was instructed to call [facility] , she verbalizes understanding.  a. Can we help arrange? NA 9. Does the patient need any other services or support that we can help arrange? No 10. Are caregivers following through as expected in assisting the patient? Yes 11. Has the patient quit smoking, drinking alcohol, or using drugs as recommended? Ms. Ivin Booty states [name] doesn't smoke or use illicit drugs, he has a glass of wine nightly. Reviewed discharge instructions with her she verbalizes understanding. [name] will continue with a nightly glass of wine.   Total Time on Call 10 minutes  Appointment date/time 01/11/2019  arrival time 1:45 for 2:00  appointment with Dr. Letta Pate. At [place]

## 2018-12-30 NOTE — Telephone Encounter (Signed)
Looks like he has an appt next week with Dr. Wells Guiles might make sense to wait until that visit, in case Dr. Stanford Breed makes any changes (and then Dr. Stanford Breed can write the longer rx at the visit). (he got #30 on 4/28)

## 2019-01-03 ENCOUNTER — Telehealth: Payer: Self-pay | Admitting: Cardiology

## 2019-01-03 DIAGNOSIS — Z9181 History of falling: Secondary | ICD-10-CM | POA: Diagnosis not present

## 2019-01-03 DIAGNOSIS — I10 Essential (primary) hypertension: Secondary | ICD-10-CM | POA: Diagnosis not present

## 2019-01-03 DIAGNOSIS — E039 Hypothyroidism, unspecified: Secondary | ICD-10-CM | POA: Diagnosis not present

## 2019-01-03 DIAGNOSIS — I69151 Hemiplegia and hemiparesis following nontraumatic intracerebral hemorrhage affecting right dominant side: Secondary | ICD-10-CM | POA: Diagnosis not present

## 2019-01-03 NOTE — Telephone Encounter (Signed)
Ok for order Brian Crenshaw  

## 2019-01-03 NOTE — Telephone Encounter (Signed)
Left verbal order on voicemail 

## 2019-01-03 NOTE — Telephone Encounter (Signed)
° °  Kat from [place] requesting verbal order. Occupational and [place] for 2 times a week for 4 weeks.   Please call [phone]

## 2019-01-04 DIAGNOSIS — Z9181 History of falling: Secondary | ICD-10-CM | POA: Diagnosis not present

## 2019-01-04 DIAGNOSIS — E039 Hypothyroidism, unspecified: Secondary | ICD-10-CM | POA: Diagnosis not present

## 2019-01-04 DIAGNOSIS — I10 Essential (primary) hypertension: Secondary | ICD-10-CM | POA: Diagnosis not present

## 2019-01-04 DIAGNOSIS — I69151 Hemiplegia and hemiparesis following nontraumatic intracerebral hemorrhage affecting right dominant side: Secondary | ICD-10-CM | POA: Diagnosis not present

## 2019-01-05 ENCOUNTER — Telehealth: Payer: Self-pay | Admitting: Cardiology

## 2019-01-05 NOTE — Progress Notes (Signed)
Virtual Visit via Video Note changed to phone visit at patient request as no smart phone   This visit type was conducted due to national recommendations for restrictions regarding the COVID-19 Pandemic (e.g. social distancing) in an effort to limit this patient's exposure and mitigate transmission in our community.  Due to his co-morbid illnesses, this patient is at least at moderate risk for complications without adequate follow up.  This format is felt to be most appropriate for this patient at this time.  All issues noted in this document were discussed and addressed.  A limited physical exam was performed with this format.  Please refer to the patient's chart for his consent to telehealth for [facility].   Date:  01/06/2019   ID:  Danny Frederick, DOB 04/13/1934, MRN [id number]  Patient Location: Home Provider Location: Home  PCP:  Rita Ohara, MD  Cardiologist:  Dr Stanford Breed  Evaluation Performed:  Follow-Up Visit  Chief Complaint: Follow-up thoracic aortic aneurysm repair and hypertension.  History of Present Illness:    FU TAA repair; also with micturition syncope, HTN. Admitted 12/2013 with a contained, ruptured ascending thoracic aortic aneurysm in the setting of streptococcal pneumoniae bacteremia/aortitis. He underwent repair of the ascending thoracic aortic aneurysm with a 32 mm Hemashield straight graft. He was also followed by infectious disease and treated with antibiotics. The patient was noted to have clot in the main pulmonary artery. This was likely related to compression of the pulmonary artery by the false aneurysm. He wastreated withCoumadin. Monitor August 2018 showed sinus bradycardia to sinus rhythm with PACs and PVCs.CTA 7/19 showed stable repair of TAA.  Patient having near syncopal episodes at previous office visit.  Monitor November 2019 showed sinus bradycardia, normal sinus rhythm, sinus tachycardia and PACs.  Echocardiogram April 2020 showed normal LV  function, mild right atrial enlargement.   Patient had non-traumatic hemorrhagic CVA April 2020. Event felt secondary to uncontrolled hypertension.  Since last seen,patient denies dyspnea, chest pain, palpitations.  It should be noted that 2 weeks prior to his hemorrhagic CVA the patient was walking and had a syncopal episode.  No preceding palpitations, chest pain or dyspnea.  No nausea.  No incontinence.  He was unconscious briefly but struck his head on the pavement.  He has had occasional presyncopal episodes in the past as outlined above.  He does not have orthostatic symptoms by report.  The patient does not have symptoms concerning for COVID-19 infection (fever, chills, cough, or new shortness of breath).    Past Medical History:  Diagnosis Date   Allergic conjunctivitis    Allergic rhinitis, cause unspecified    on allergy shots (Dr. Orvil Feil)   Atherosclerosis of aorta [facility])    noted on CT angio of abd/pelvis, in many vessels   BPH (benign prostatic hypertrophy)    Diverticulosis of colon 12/09   Foot drop, right    GERD (gastroesophageal reflux disease)    Hearing loss in left ear    both ears now   History of Clostridium difficile    multiple times 2016--s/p fecal transplant (Dr. Baxter Flattery).   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 Bebe Liter TRANSPLANT N/A 11/09/2014   Procedure: FECAL TRANSPLANT;  Surgeon: Jerene Bears, MD;  Location: [facility];  Service: Gastroenterology;  Laterality: N/A;   HEMORRHOIDECTOMY WITH HEMORRHOID BANDING  04/07/13   hemorroidal banding  04/07/2013   x3-Dr.Eric Redmond Pulling   PROSTATE SURGERY  2007   photovaporization   ROTATOR CUFF REPAIR  10/2008   left; Dr. Onnie Graham   SPINE SURGERY  2006   L4-5 disk surgery   SPINE SURGERY  04/2013   L4-5, L5-S1 fusion.  Dr. Christella Noa   THORACIC AORTIC ANEURYSM REPAIR N/A 01/23/2014   Procedure: [place] (AAA);  Surgeon: Rexene Alberts, MD;  Location: [place];  Service: Open Heart Surgery;  Laterality: N/A;   TONSILLECTOMY       Current Meds  Medication Sig   acetaminophen (TYLENOL) 325 MG tablet Take 2 tablets (650 mg total) by mouth every 4 (four) hours as needed for mild pain (or temp > 37.5 C (99.5 F)).   amLODipine (NORVASC) 10 MG tablet Take 1 tablet (10 mg total) by mouth daily.   atorvastatin (LIPITOR) 10 MG tablet Take 1 tablet (10 mg total) by mouth at bedtime.   Cholecalciferol (VITAMIN D3) 2000 units TABS Take 2,000 Units by mouth daily.    citalopram (CELEXA) 20 MG tablet Take 1 tablet (20 mg total) by mouth daily.   Ferrous Sulfate (IRON) 325 (65 Fe) MG TABS Take 1 tablet (325 mg total) by mouth every other day. Reported on 11/08/2015   ipratropium (ATROVENT) 0.03 % nasal spray Place 2 sprays into both nostrils 2 (two) times daily as needed for rhinitis.    irbesartan (AVAPRO) 300 MG tablet Take 1 tablet (300 mg total) by mouth daily.   metoprolol tartrate (LOPRESSOR) 25 MG tablet Take 1 tablet (25 mg total) by mouth 2 (two) times daily.   omeprazole (PRILOSEC) 20 MG capsule TAKE 1 CAPSULE BY MOUTH  EVERY OTHER DAY  (Patient taking differently: Take 20 mg by mouth every other day. )   OVER THE COUNTER MEDICATION Place 1 application into both eyes daily as needed (dry eyes/irritation). OTC eye gel   SYNTHROID 50 MCG tablet Take 1 tablet (50 mcg total) by mouth daily before breakfast.   [DISCONTINUED] senna-docusate (SENOKOT-S) 8.6-50 MG tablet Take 1 tablet by mouth 2 (two) times daily.     Allergies:   Patient has no known allergies.   Social History   Tobacco Use   Smoking status: Former Smoker    Types: Cigarettes    Last attempt to quit: 09/01/1977    Years since quitting: 41.3   Smokeless tobacco: Never Used  Substance Use Topics   Alcohol use: Yes    Comment: 2 glasses wine per day   Drug use: No     Family Hx: The patient's family history includes Cancer in his brother and brother; Depression in his mother; Diabetes in his mother and sister; Emphysema in his brother; Heart disease in his mother; Hypertension in his  father and mother; Parkinsonism in his father; Stroke in his father. There is no history of Colon cancer.  ROS:   Please see the history of present illness.    No fevers, chills or productive cough. All other systems reviewed and are negative.   Recent Labs: 08/11/2018: TSH 2.080 12/14/2018: ALT 15; BUN 16; Creatinine, Ser 1.11; Hemoglobin 13.9; Platelets 297; Potassium 3.6; Sodium 136   Recent Lipid Panel Lab Results  Component Value Date/Time   CHOL 152 12/12/2018 04:32 AM   CHOL 148 12/06/2018 10:13 AM   TRIG 67 12/12/2018 04:32 AM   HDL 70 12/12/2018 04:32 AM   HDL 69 12/06/2018 10:13 AM   CHOLHDL 2.2 12/12/2018 04:32 AM   LDLCALC 69 12/12/2018 04:32 AM   LDLCALC 67 12/06/2018 10:13 AM    Wt Readings from Last 3 Encounters:  01/06/19 125 lb (56.7 kg)  12/16/18 132 lb 11.5 oz (60.2 kg)  12/11/18 136 lb 14.5 oz (62.1 kg)     Objective:    Vital Signs:  BP 93/64    Pulse 70    Ht 5\' 4"  (1.626 m)    Wt 125 lb (56.7 kg)    BMI 21.46 kg/m    VITAL  SIGNS:  reviewed  No acute distress Normal affect Answers questions appropriately Remainder physical examination not performed (telehealth visit; coronavirus pandemic)  ASSESSMENT & PLAN:    1. History of thoracic aortic aneurysm repair-most recent CTA showed stable repair. 2. Hypertension-patient's blood pressure is low.  I am concerned that this could precipitate potential dizziness and recurrent syncope.  Decrease amlodipine to 5 mg daily.  Follow blood pressure and adjust regimen as needed.  Given previous hemorrhagic CVA we need to balance keeping blood pressure controlled versus too low with presyncope. 3. Hyperlipidemia-continue statin. 4. History of near syncope-previous monitor showed no significant arrhythmia.  I discussed an implantable loop monitor with patient today.  However he would prefer to avoid at this point.  He will consider if he has any more presyncopal episodes.  I have asked him to maintain hydration. 5. Recent nontraumatic hemorrhagic CVA felt secondary to uncontrolled hypertension-as outlined above blood pressure is now borderline with a systolic of 90.  Decrease amlodipine to 5 mg daily and follow.  COVID-19 Education: The importance of social distancing was discussed today.  Time:   Today, I have spent 15 minutes with the patient with telehealth technology discussing the above problems.     Medication Adjustments/Labs and Tests Ordered: Current medicines are reviewed at length with the patient today.  Concerns regarding medicines are outlined above.   Tests Ordered: No orders of the defined types were placed in this encounter.   Medication Changes: No orders of the defined types were placed in this encounter.   Disposition:  Follow up in 3 month(s)  Signed, Kirk Ruths, MD  01/06/2019 10:44 AM    [facility]

## 2019-01-06 ENCOUNTER — Encounter: Payer: Self-pay | Admitting: *Deleted

## 2019-01-06 ENCOUNTER — Telehealth (INDEPENDENT_AMBULATORY_CARE_PROVIDER_SITE_OTHER): Payer: Medicare Other | Admitting: Cardiology

## 2019-01-06 ENCOUNTER — Telehealth: Payer: Self-pay | Admitting: *Deleted

## 2019-01-06 VITALS — BP 93/64 | HR 70 | Ht 64.0 in | Wt 125.0 lb

## 2019-01-06 DIAGNOSIS — Z9181 History of falling: Secondary | ICD-10-CM | POA: Diagnosis not present

## 2019-01-06 DIAGNOSIS — R55 Syncope and collapse: Secondary | ICD-10-CM

## 2019-01-06 DIAGNOSIS — I10 Essential (primary) hypertension: Secondary | ICD-10-CM

## 2019-01-06 DIAGNOSIS — E7800 Pure hypercholesterolemia, unspecified: Secondary | ICD-10-CM

## 2019-01-06 DIAGNOSIS — I69151 Hemiplegia and hemiparesis following nontraumatic intracerebral hemorrhage affecting right dominant side: Secondary | ICD-10-CM | POA: Diagnosis not present

## 2019-01-06 DIAGNOSIS — E039 Hypothyroidism, unspecified: Secondary | ICD-10-CM | POA: Diagnosis not present

## 2019-01-06 MED ORDER — AMLODIPINE BESYLATE 5 MG PO TABS: 5.0000 mg | ORAL_TABLET | Freq: Every day | ORAL | 3 refills | Status: DC

## 2019-01-06 NOTE — Patient Instructions (Signed)
Medication Instructions:  DECREASE AMLODIPINE TO 5 MG ONCE DAILY=1/2 FO THE 10 GM TABLET ONCE DAILY If you need a refill on your cardiac medications before your next appointment, please call your pharmacy.   Lab work: If you have labs (blood work) drawn today and your tests are completely normal, you will receive your results only by: Marland Kitchen MyChart Message (if you have MyChart) OR . A paper copy in the mail If you have any lab test that is abnormal or we need to change your treatment, we will call you to review the results.  Follow-Up: At [facility], you and your health needs are our priority.  As part of our continuing mission to provide you with exceptional heart care, we have created designated Provider Care Teams.  These Care Teams include your primary Cardiologist (physician) and Advanced Practice Providers (APPs -  Physician Assistants and Nurse Practitioners) who all work together to provide you with the care you need, when you need it. Your physician recommends that you schedule a follow-up appointment in: [place]

## 2019-01-06 NOTE — Telephone Encounter (Signed)
Virtual Visit Pre-Appointment Phone Call  "(Name), I am calling you today to discuss your upcoming appointment. We are currently trying to limit exposure to the virus that causes COVID-19 by seeing patients at home rather than in the office."  1. "What is the BEST phone number to call the day of the visit?" - include this in appointment notes  2. Do you have or have access to (through a family member/friend) a smartphone with video capability that we can use for your visit?" a. If yes - list this number in appt notes as cell (if different from BEST phone #) and list the appointment type as a VIDEO visit in appointment notes b. If no - list the appointment type as a PHONE visit in appointment notes  3. Confirm consent - "In the setting of the current Covid19 crisis, you are scheduled for a (phone or video) visit with your provider on (date) at (time).  Just as we do with many in-office visits, in order for you to participate in this visit, we must obtain consent.  If you'd like, I can send this to your mychart (if signed up) or email for you to review.  Otherwise, I can obtain your verbal consent now.  All virtual visits are billed to your insurance company just like a normal visit would be.  By agreeing to a virtual visit, we'd like you to understand that the technology does not allow for your provider to perform an examination, and thus may limit your provider's ability to fully assess your condition. If your provider identifies any concerns that need to be evaluated in person, we will make arrangements to do so.  Finally, though the technology is pretty good, we cannot assure that it will always work on either your or our end, and in the setting of a video visit, we may have to convert it to a phone-only visit.  In either situation, we cannot ensure that we have a secure connection.  Are you willing to proceed?" STAFF: Did the patient verbally acknowledge consent to telehealth visit? Document  YES/NO here: YES  4. Advise patient to be prepared - "Two hours prior to your appointment, go ahead and check your blood pressure, pulse, oxygen saturation, and your weight (if you have the equipment to check those) and write them all down. When your visit starts, your provider will ask you for this information. If you have an Apple Watch or Kardia device, please plan to have heart rate information ready on the day of your appointment. Please have a pen and paper handy nearby the day of the visit as well."  5. Give patient instructions for MyChart download to smartphone OR Doximity/Doxy.me as below if video visit (depending on what platform provider is using)  6. Inform patient they will receive a phone call 15 minutes prior to their appointment time (may be from unknown caller ID) so they should be prepared to answer    TELEPHONE CALL NOTE  Danny Frederick has been deemed a candidate for a follow-up tele-health visit to limit community exposure during the Covid-19 pandemic. I spoke with the patient via phone to ensure availability of phone/video source, confirm preferred email & phone number, and discuss instructions and expectations.  I reminded Danny Frederick to be prepared with any vital sign and/or heart rhythm information that could potentially be obtained via home monitoring, at the time of his visit. I reminded Danny Frederick to expect a phone call prior to his visit.  Danny Frederick, Danny Frederick 01/06/2019 10:06 AM   INSTRUCTIONS FOR DOWNLOADING THE MYCHART APP TO SMARTPHONE  - The patient must first make sure to have activated MyChart and know their login information - If Apple, go to [employer] and type in MyChart in the search bar and download the app. If Android, ask patient to go to [employer] and type in [place] in the search bar and download the app. The app is free but as with any other app downloads, their phone may require them to verify saved payment information or  Apple/Android password.  - The patient will need to then log into the app with their MyChart username and password, and select [facility] as their healthcare provider to link the account. When it is time for your visit, go to the MyChart app, find appointments, and click Begin Video Visit. Be sure to Select Allow for your device to access the Microphone and Camera for your visit. You will then be connected, and your provider will be with you shortly.  **If they have any issues connecting, or need assistance please contact MyChart service desk [phone])**  **If using a computer, in order to ensure the best quality for their visit they will need to use either of the following Internet Browsers: [employer], or Google Chrome**  IF USING DOXIMITY or DOXY.ME - The patient will receive a link just prior to their visit by text.     FULL LENGTH CONSENT FOR TELE-HEALTH VISIT   I hereby voluntarily request, consent and authorize [facility] and its employed or contracted physicians, physician assistants, nurse practitioners or other licensed health care professionals (the Practitioner), to provide me with telemedicine health care services (the Services") as deemed necessary by the treating Practitioner. I acknowledge and consent to receive the Services by the Practitioner via telemedicine. I understand that the telemedicine visit will involve communicating with the Practitioner through live audiovisual communication technology and the disclosure of certain medical information by electronic transmission. I acknowledge that I have been given the opportunity to request an in-person assessment or other available alternative prior to the telemedicine visit and am voluntarily participating in the telemedicine visit.  I understand that I have the right to withhold or withdraw my consent to the use of telemedicine in the course of my care at any time, without affecting my right to future care  or treatment, and that the Practitioner or I may terminate the telemedicine visit at any time. I understand that I have the right to inspect all information obtained and/or recorded in the course of the telemedicine visit and may receive copies of available information for a reasonable fee.  I understand that some of the potential risks of receiving the Services via telemedicine include:   Delay or interruption in medical evaluation due to technological equipment failure or disruption;  Information transmitted may not be sufficient (e.g. poor resolution of images) to allow for appropriate medical decision making by the Practitioner; and/or   In rare instances, security protocols could fail, causing a breach of personal health information.  Furthermore, I acknowledge that it is my responsibility to provide information about my medical history, conditions and care that is complete and accurate to the best of my ability. I acknowledge that Practitioner's advice, recommendations, and/or decision may be based on factors not within their control, such as incomplete or inaccurate data provided by me or distortions of diagnostic images or specimens that may result from electronic transmissions. I understand that  the practice of medicine is not an exact science and that Practitioner makes no warranties or guarantees regarding treatment outcomes. I acknowledge that I will receive a copy of this consent concurrently upon execution via email to the email address I last provided but may also request a printed copy by calling the office of [facility].    I understand that my insurance will be billed for this visit.   I have read or had this consent read to me.  I understand the contents of this consent, which adequately explains the benefits and risks of the Services being provided via telemedicine.   I have been provided ample opportunity to ask questions regarding this consent and the Services and have had  my questions answered to my satisfaction.  I give my informed consent for the services to be provided through the use of telemedicine in my medical care  By participating in this telemedicine visit I agree to the above.       Cardiac Questionnaire:    Since your last visit or hospitalization:    1. Have you been having new or worsening chest pain? NO   2. Have you been having new or worsening shortness of breath? YES 3. Have you been having new or worsening leg swelling, wt gain, or increase in abdominal girth (pants fitting more tightly)? NO   4. Have you had any passing out spells? NO    *A YES to any of these questions would result in the appointment being kept. *If all the answers to these questions are NO, we should indicate that given the current situation regarding the worldwide coronarvirus pandemic, at the recommendation of the CDC, we are looking to limit gatherings in our waiting area, and thus will reschedule their appointment beyond four weeks from today.   _____________   COVID-19 Pre-Screening Questions:   Do you currently have a fever? NO  Have you recently travelled on a cruise, internationally, or to [place], [place], [place], [place], [place], or [place], [place] [employer]) ? NO  Have you been in contact with someone that is currently pending confirmation of Covid19 testing or has been confirmed to have the [place] virus?  NO  Are you currently experiencing fatigue or cough? NO

## 2019-01-10 DIAGNOSIS — E039 Hypothyroidism, unspecified: Secondary | ICD-10-CM | POA: Diagnosis not present

## 2019-01-10 DIAGNOSIS — I69151 Hemiplegia and hemiparesis following nontraumatic intracerebral hemorrhage affecting right dominant side: Secondary | ICD-10-CM | POA: Diagnosis not present

## 2019-01-10 DIAGNOSIS — Z9181 History of falling: Secondary | ICD-10-CM | POA: Diagnosis not present

## 2019-01-10 DIAGNOSIS — I10 Essential (primary) hypertension: Secondary | ICD-10-CM | POA: Diagnosis not present

## 2019-01-11 ENCOUNTER — Encounter: Payer: Medicare Other | Attending: Registered Nurse | Admitting: Physical Medicine & Rehabilitation

## 2019-01-11 ENCOUNTER — Other Ambulatory Visit: Payer: Self-pay

## 2019-01-11 ENCOUNTER — Encounter: Payer: Self-pay | Admitting: Physical Medicine & Rehabilitation

## 2019-01-11 ENCOUNTER — Encounter: Payer: Self-pay | Admitting: Family Medicine

## 2019-01-11 VITALS — BP 118/73 | HR 58 | Temp 98.1°F | Ht 62.25 in | Wt 130.0 lb

## 2019-01-11 DIAGNOSIS — R269 Unspecified abnormalities of gait and mobility: Secondary | ICD-10-CM | POA: Diagnosis not present

## 2019-01-11 DIAGNOSIS — Z833 Family history of diabetes mellitus: Secondary | ICD-10-CM | POA: Insufficient documentation

## 2019-01-11 DIAGNOSIS — M67911 Unspecified disorder of synovium and tendon, right shoulder: Secondary | ICD-10-CM | POA: Diagnosis not present

## 2019-01-11 DIAGNOSIS — Z8042 Family history of malignant neoplasm of prostate: Secondary | ICD-10-CM | POA: Diagnosis not present

## 2019-01-11 DIAGNOSIS — Z87891 Personal history of nicotine dependence: Secondary | ICD-10-CM | POA: Diagnosis not present

## 2019-01-11 DIAGNOSIS — R278 Other lack of coordination: Secondary | ICD-10-CM | POA: Insufficient documentation

## 2019-01-11 DIAGNOSIS — Z8249 Family history of ischemic heart disease and other diseases of the circulatory system: Secondary | ICD-10-CM | POA: Insufficient documentation

## 2019-01-11 DIAGNOSIS — I10 Essential (primary) hypertension: Secondary | ICD-10-CM | POA: Insufficient documentation

## 2019-01-11 DIAGNOSIS — I69398 Other sequelae of cerebral infarction: Secondary | ICD-10-CM

## 2019-01-11 DIAGNOSIS — Z8052 Family history of malignant neoplasm of bladder: Secondary | ICD-10-CM | POA: Insufficient documentation

## 2019-01-11 DIAGNOSIS — I639 Cerebral infarction, unspecified: Secondary | ICD-10-CM

## 2019-01-11 DIAGNOSIS — Z86711 Personal history of pulmonary embolism: Secondary | ICD-10-CM | POA: Insufficient documentation

## 2019-01-11 DIAGNOSIS — Z789 Other specified health status: Secondary | ICD-10-CM

## 2019-01-11 DIAGNOSIS — I6381 Other cerebral infarction due to occlusion or stenosis of small artery: Secondary | ICD-10-CM

## 2019-01-11 NOTE — Progress Notes (Signed)
Subjective:    Patient ID: Danny Frederick, male    DOB: [birth year], 85 y.o.   MRN: [id number] 85 year old right-handed male history of hypertension, BPH as well as a sending aortic aneurysm repair 2015. Lives with spouse independent prior to admission. Presented 4/11//2020 with unsteady gait and dizziness and numbness of right side. Reports of fall 3 weeks ago. Blood pressure 220/100. CT scan showed acute hemorrhage stroke hypertensive involving the posterior left basal ganglia no associated midline shift. Troponin negative. Echocardiogram with ejection fraction of 53% normal systolic function. Neurology follow-up [place] felt most likely related to hypertensive episode. Tolerating a regular diet. Patient was admitted for a comprehensive rehabilitation program   Hospital Course: Danny Frederick was admitted to rehab 12/13/2018 for inpatient therapies to consist of PT, ST and OT at least three hours five days a week. Past admission physiatrist, therapy team and rehab RN have worked together to provide customized collaborative inpatient rehab.pertaining to patient left basal ganglia ICH felt likely related to hypertensive crisis. Blood pressures monitored and patient would follow up with neurology services. DVT prophylaxis with SCDs no signs of DVT.blood pressures controlled with Norvasc 10 mg daily, Avapro 300 mg daily and Lopressor 25 mg twice a day Admit date: 12/13/2018 Discharge date: 12/28/2018  HPI No falls since d/c from therapy HHPT and OT are coming out Dressing and bathing mod I , using sink but not shower chair, has HHA starting tomorrow Stairlift being installed  Phone meeting with Dr Stanford Breed, amlodipine reduced from 10mg  to 5mg , BP looks good today Irbesartan and metoprolol ongoing  Mood ok on Celexa  PCP- Rita Ohara MD, has not seen her due to medical leave  Here with daughter who can drive him to appts   Pain Inventory Average Pain 0 Pain Right Now 0 My pain is na  In the  last 24 hours, has pain interfered with the following? General activity 0 Relation with others 0 Enjoyment of life 0 What TIME of day is your pain at its worst? na Sleep (in general) Good  Pain is worse with: na Pain improves with: na Relief from Meds: na  Mobility walk with assistance use a walker how many minutes can you walk? 5 ability to climb steps?  yes do you drive?  yes  Function retired I need assistance with the following:  bathing, meal prep and household duties  Neuro/Psych dizziness depression anxiety  Prior Studies Any changes since last visit?  no  Physicians involved in your care Any changes since last visit?  no   Family History  Problem Relation Age of Onset  . Diabetes Mother   . Heart disease Mother        CHF  . Hypertension Mother   . Depression Mother   . Stroke Father   . Parkinsonism Father   . Hypertension Father   . Cancer Brother        bladder cancer  . Cancer Brother        prostate cancer  . Emphysema Brother   . Diabetes Sister   . Colon cancer Neg Hx    Social History   Socioeconomic History  . Marital status: Married    Spouse name: Not on file  . Number of children: 2  . Years of education: Not on file  . Highest education level: Not on file  Occupational History  . Not on file  Social Needs  . Financial resource strain: Not on file  . Food insecurity:  Worry: Not on file    Inability: Not on file  . Transportation needs:    Medical: Not on file    Non-medical: Not on file  Tobacco Use  . Smoking status: Former Smoker    Types: Cigarettes    Last attempt to quit: 09/01/1977    Years since quitting: 41.3  . Smokeless tobacco: Never Used  Substance and Sexual Activity  . Alcohol use: Yes    Comment: 2 glasses wine per day  . Drug use: No  . Sexual activity: Not on file  Lifestyle  . Physical activity:    Days per week: Not on file    Minutes per session: Not on file  . Stress: Not on file   Relationships  . Social connections:    Talks on phone: Not on file    Gets together: Not on file    Attends religious service: Not on file    Active member of club or organization: Not on file    Attends meetings of clubs or organizations: Not on file    Relationship status: Not on file  Other Topics Concern  . Not on file  Social History Narrative   Lives with wife. 1 daughter in [place], 1 daughter in [place], [place].   5 grandchildren.   Past Surgical History:  Procedure Laterality Date  . CERVICAL LAMINECTOMY  1972   C5-6  . COLONOSCOPY WITH PROPOFOL N/A 11/09/2014   Procedure: COLONOSCOPY WITH PROPOFOL;  Surgeon: Jerene Bears, MD;  Location: [facility];  Service: Gastroenterology;  Laterality: N/A;  . ESOPHAGOGASTRODUODENOSCOPY  10/31/08   normal; Dr. Edison Nasuti  . FECAL TRANSPLANT N/A 11/09/2014   Procedure: FECAL TRANSPLANT;  Surgeon: Jerene Bears, MD;  Location: [facility];  Service: Gastroenterology;  Laterality: N/A;  . HEMORRHOIDECTOMY WITH HEMORRHOID BANDING  04/07/13  . hemorroidal banding  04/07/2013   x3-Dr.Eric Redmond Pulling  . PROSTATE SURGERY  2007   photovaporization  . ROTATOR CUFF REPAIR  10/2008   left; Dr. Onnie Graham  . SPINE SURGERY  2006   L4-5 disk surgery  . SPINE SURGERY  04/2013   L4-5, L5-S1 fusion.  Dr. Christella Noa  . THORACIC AORTIC ANEURYSM REPAIR N/A 01/23/2014   Procedure: THORACIC ASCENDING ANEURYSM REPAIR (AAA);  Surgeon: Rexene Alberts, MD;  Location: [facility];  Service: Open Heart Surgery;  Laterality: N/A;  . TONSILLECTOMY     Past Medical History:  Diagnosis Date  . Allergic conjunctivitis   . Allergic rhinitis, cause unspecified    on allergy shots (Dr. Orvil Feil)  . Atherosclerosis of aorta (HCC)    noted on CT angio of abd/pelvis, in many vessels  . BPH (benign prostatic hypertrophy)   . Diverticulosis of colon 12/09  . Foot drop, right   . GERD (gastroesophageal reflux disease)   . Hearing loss in left ear    both ears now  . History of  Clostridium difficile    multiple times 2016--s/p fecal transplant (Dr. Baxter Flattery).  Marland Kitchen Hx of echocardiogram    Echo (03/2014):  Mild LVH, EF 50-55%, no RWMA, Gr 1 DD, mild MR, mild reduced RVSF  . Hypertension   . Internal hemorrhoids   . Iron deficiency anemia, unspecified 6/09  . Mitral valve prolapse    h/o; normal echo 12/2011 with no MVP seen  . 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 Right ventricular dysfunction 01/23/2014   Secondary to obstruction of main pulmonary artery  . Ruptured thoracic aortic aneurysm ([place]) 01/23/2014  . S/P ascending  aortic aneurysm repair 01/24/2014  . Unspecified hypothyroidism    BP 118/73   Pulse (!) 58   Temp 98.1 F (36.7 C)   Ht 5' 2.25" (1.581 m)   Wt 130 lb (59 kg)   SpO2 96%   BMI 23.59 kg/m   Opioid Risk Score:   Fall Risk Score:  `1  Depression screen PHQ 2/9  Depression screen [facility] 2/9 01/11/2019 12/06/2018 09/13/2018 08/16/2018 12/14/2017 10/22/2016 10/03/2015  Decreased Interest 0 0 0 1 0 - 0  Down, Depressed, Hopeless 0 0 0 3 0 0 0  PHQ - 2 Score 0 0 0 4 0 0 0  Altered sleeping - 0 0 0 - - -  Tired, decreased energy - 3 3 3  - - -  Change in appetite - 0 0 2 - - -  Feeling bad or failure about yourself  - 0 0 0 - - -  Trouble concentrating - 0 0 0 - - -  Moving slowly or fidgety/restless - 0 0 0 - - -  Suicidal thoughts - 0 0 3 - - -  PHQ-9 Score - 3 3 12  - - -  Difficult doing work/chores - - - Somewhat difficult - - -  Some recent data might be hidden    Review of Systems  Constitutional: Negative.   HENT: Negative.   Eyes: Negative.   Respiratory: Negative.   Cardiovascular: Negative.   Gastrointestinal: Negative.   Endocrine: Negative.   Genitourinary: Negative.   Musculoskeletal: Negative.   Skin: Negative.   Allergic/Immunologic: Negative.   Neurological: Negative.   Hematological: Negative.   Psychiatric/Behavioral: Negative.   All other systems reviewed and are negative.      Objective:   Physical Exam  Vitals signs and nursing note reviewed. Exam conducted with a chaperone present.  Constitutional:      Appearance: Normal appearance.  HENT:     Head: Normocephalic and atraumatic.     Mouth/Throat:     Mouth: Mucous membranes are moist.  Eyes:     General: No visual field deficit.    Extraocular Movements: Extraocular movements intact.     Conjunctiva/sclera: Conjunctivae normal.     Pupils: Pupils are equal, round, and reactive to light.     Visual Fields: Right eye visual fields normal and left eye visual fields normal.  Neck:     Musculoskeletal: Normal range of motion.  Cardiovascular:     Rate and Rhythm: Normal rate and regular rhythm.     Heart sounds: Normal heart sounds. No murmur.  Pulmonary:     Effort: Pulmonary effort is normal. No respiratory distress.     Breath sounds: Normal breath sounds. No stridor.  Abdominal:     General: Abdomen is flat. There is no distension.     Palpations: Abdomen is soft.     Tenderness: There is no abdominal tenderness.  Musculoskeletal:     Right shoulder: He exhibits decreased range of motion and decreased strength. He exhibits no tenderness.  Skin:    General: Skin is warm and dry.     Coloration: Skin is not jaundiced.  Neurological:     General: No focal deficit present.     Mental Status: He is alert.     Cranial Nerves: No dysarthria or facial asymmetry.     Sensory: Sensation is intact.     Motor: Weakness present. No abnormal muscle tone.     Coordination: Romberg sign positive. Coordination abnormal. Finger-Nose-Finger Test abnormal. Rapid alternating  movements normal.     Gait: Gait abnormal and tandem walk abnormal.     Comments: Motor strength is 3- right deltoid 5 left deltoid 5 bilateral bicep tricep finger flexors and extensors 5 bilateral hip flexor knee extensor ankle dorsiflexor There is dysmetria on finger-nose-finger testing in the right upper extremity  Ambulates with a walker.  He can ambulate with  hand-held assist x1.  He has a wide base of support. He is unable to perform tandem gait             Assessment & Plan:  1.  Left basal ganglia hemorrhage with right upper extremity dysmetria and gait disorder.  Overall has made a good recovery.  Ambulating with a walker and still requiring some assistance for bathing. Continue home health therapy Follow-up with neurology Follow-up with cardiology and internal medicine for blood pressure management and secondary stroke reduction Follow-up with physical medicine rehab in 1 month we may need to transition to outpatient therapy at that time

## 2019-01-11 NOTE — Patient Instructions (Signed)
Please ask neurologist about  Trying Parkinson's meds

## 2019-01-13 DIAGNOSIS — I69151 Hemiplegia and hemiparesis following nontraumatic intracerebral hemorrhage affecting right dominant side: Secondary | ICD-10-CM | POA: Diagnosis not present

## 2019-01-13 DIAGNOSIS — I10 Essential (primary) hypertension: Secondary | ICD-10-CM | POA: Diagnosis not present

## 2019-01-13 DIAGNOSIS — E039 Hypothyroidism, unspecified: Secondary | ICD-10-CM | POA: Diagnosis not present

## 2019-01-13 DIAGNOSIS — Z9181 History of falling: Secondary | ICD-10-CM | POA: Diagnosis not present

## 2019-01-14 DIAGNOSIS — E039 Hypothyroidism, unspecified: Secondary | ICD-10-CM | POA: Diagnosis not present

## 2019-01-14 DIAGNOSIS — Z9181 History of falling: Secondary | ICD-10-CM | POA: Diagnosis not present

## 2019-01-14 DIAGNOSIS — I10 Essential (primary) hypertension: Secondary | ICD-10-CM | POA: Diagnosis not present

## 2019-01-14 DIAGNOSIS — I69151 Hemiplegia and hemiparesis following nontraumatic intracerebral hemorrhage affecting right dominant side: Secondary | ICD-10-CM | POA: Diagnosis not present

## 2019-01-17 DIAGNOSIS — I10 Essential (primary) hypertension: Secondary | ICD-10-CM | POA: Diagnosis not present

## 2019-01-17 DIAGNOSIS — Z9181 History of falling: Secondary | ICD-10-CM | POA: Diagnosis not present

## 2019-01-17 DIAGNOSIS — I69151 Hemiplegia and hemiparesis following nontraumatic intracerebral hemorrhage affecting right dominant side: Secondary | ICD-10-CM | POA: Diagnosis not present

## 2019-01-17 DIAGNOSIS — E039 Hypothyroidism, unspecified: Secondary | ICD-10-CM | POA: Diagnosis not present

## 2019-01-18 DIAGNOSIS — I69151 Hemiplegia and hemiparesis following nontraumatic intracerebral hemorrhage affecting right dominant side: Secondary | ICD-10-CM | POA: Diagnosis not present

## 2019-01-18 DIAGNOSIS — I10 Essential (primary) hypertension: Secondary | ICD-10-CM | POA: Diagnosis not present

## 2019-01-18 DIAGNOSIS — E039 Hypothyroidism, unspecified: Secondary | ICD-10-CM | POA: Diagnosis not present

## 2019-01-18 DIAGNOSIS — Z9181 History of falling: Secondary | ICD-10-CM | POA: Diagnosis not present

## 2019-01-20 DIAGNOSIS — I10 Essential (primary) hypertension: Secondary | ICD-10-CM | POA: Diagnosis not present

## 2019-01-20 DIAGNOSIS — E039 Hypothyroidism, unspecified: Secondary | ICD-10-CM | POA: Diagnosis not present

## 2019-01-20 DIAGNOSIS — Z9181 History of falling: Secondary | ICD-10-CM | POA: Diagnosis not present

## 2019-01-20 DIAGNOSIS — I69151 Hemiplegia and hemiparesis following nontraumatic intracerebral hemorrhage affecting right dominant side: Secondary | ICD-10-CM | POA: Diagnosis not present

## 2019-01-21 ENCOUNTER — Ambulatory Visit: Payer: Medicare Other | Admitting: Podiatry

## 2019-01-21 DIAGNOSIS — E039 Hypothyroidism, unspecified: Secondary | ICD-10-CM | POA: Diagnosis not present

## 2019-01-21 DIAGNOSIS — I10 Essential (primary) hypertension: Secondary | ICD-10-CM | POA: Diagnosis not present

## 2019-01-21 DIAGNOSIS — Z9181 History of falling: Secondary | ICD-10-CM | POA: Diagnosis not present

## 2019-01-21 DIAGNOSIS — I69151 Hemiplegia and hemiparesis following nontraumatic intracerebral hemorrhage affecting right dominant side: Secondary | ICD-10-CM | POA: Diagnosis not present

## 2019-01-25 ENCOUNTER — Telehealth: Payer: Self-pay

## 2019-01-25 ENCOUNTER — Other Ambulatory Visit: Payer: Self-pay

## 2019-01-25 ENCOUNTER — Other Ambulatory Visit: Payer: Medicare Other

## 2019-01-25 DIAGNOSIS — E039 Hypothyroidism, unspecified: Secondary | ICD-10-CM | POA: Diagnosis not present

## 2019-01-25 DIAGNOSIS — I69151 Hemiplegia and hemiparesis following nontraumatic intracerebral hemorrhage affecting right dominant side: Secondary | ICD-10-CM | POA: Diagnosis not present

## 2019-01-25 DIAGNOSIS — I10 Essential (primary) hypertension: Secondary | ICD-10-CM | POA: Diagnosis not present

## 2019-01-25 DIAGNOSIS — Z9181 History of falling: Secondary | ICD-10-CM | POA: Diagnosis not present

## 2019-01-25 NOTE — Telephone Encounter (Signed)
Patient came in this morning to lab work and the orders were not in the chart.  Can you please tell me what you would like to order on this patient?   Thank you

## 2019-01-25 NOTE — Addendum Note (Signed)
Addended byRita Ohara on: 01/25/2019 12:05 PM   Modules accepted: Orders

## 2019-01-25 NOTE — Telephone Encounter (Signed)
Thank you :)

## 2019-01-25 NOTE — Telephone Encounter (Signed)
It is a shame this wasn't noticed earlier.  He actually does NOT need any labs--he had a ton done when in the hospital in April.  I guess since he was here, just do a TSH (he is on Synthroid, but really only needs it checked once a year, done in December); I know he is reporting some fatigue. I entered future order.

## 2019-01-26 LAB — TSH: TSH: 3.63 u[IU]/mL (ref 0.450–4.500)

## 2019-01-27 ENCOUNTER — Telehealth: Payer: Self-pay | Admitting: Cardiology

## 2019-01-27 DIAGNOSIS — Z9181 History of falling: Secondary | ICD-10-CM | POA: Diagnosis not present

## 2019-01-27 DIAGNOSIS — I10 Essential (primary) hypertension: Secondary | ICD-10-CM | POA: Diagnosis not present

## 2019-01-27 DIAGNOSIS — I69151 Hemiplegia and hemiparesis following nontraumatic intracerebral hemorrhage affecting right dominant side: Secondary | ICD-10-CM | POA: Diagnosis not present

## 2019-01-27 DIAGNOSIS — E039 Hypothyroidism, unspecified: Secondary | ICD-10-CM | POA: Diagnosis not present

## 2019-01-27 NOTE — Telephone Encounter (Signed)
Ok  [name]  

## 2019-01-27 NOTE — Telephone Encounter (Signed)
New message   Per kat wants to request 2 times a week for one week and one time a week for one week for occupational therapy. Please call to discuss.

## 2019-01-27 NOTE — Telephone Encounter (Signed)
Left verbal orders for OT on secure voicemail.

## 2019-01-28 DIAGNOSIS — I69151 Hemiplegia and hemiparesis following nontraumatic intracerebral hemorrhage affecting right dominant side: Secondary | ICD-10-CM | POA: Diagnosis not present

## 2019-01-28 DIAGNOSIS — E039 Hypothyroidism, unspecified: Secondary | ICD-10-CM | POA: Diagnosis not present

## 2019-01-28 DIAGNOSIS — I10 Essential (primary) hypertension: Secondary | ICD-10-CM | POA: Diagnosis not present

## 2019-01-28 DIAGNOSIS — Z9181 History of falling: Secondary | ICD-10-CM | POA: Diagnosis not present

## 2019-01-29 ENCOUNTER — Encounter: Payer: Self-pay | Admitting: Family Medicine

## 2019-01-30 NOTE — Progress Notes (Signed)
Start time: 2:02 End time: 2:53   Virtual Visit via Video Note  I connected with Danny Frederick on 01/31/2019 by a video enabled telemedicine application and verified that I am speaking with the correct person using two identifiers.  Location: Patient: At home.  [name] is with him, and wife is also present in the room. Provider: office   I discussed the limitations of evaluation and management by telemedicine and the availability of in person appointments. The patient expressed understanding and agreed to proceed. He consents to [place] filing his insurance for this visit.  History of Present Illness:  Chief Complaint  Patient presents with  . Medicare Wellness    VIRTUAL AWV. Having some bowel issue-sort of like the cdiff a few years ago. Happening about every 3-4 days. Loose stools and having hard time making it to the restroom-no abd pain or warning.     Danny Frederick is a 85 y.o. male who presents for annual wellness visit and follow-up on chronic medical conditions.  He has the following concerns:  Loose stools every 3-4 days, some trouble with incontinence. Occurred about 3-4 times, with incredible urgency.  No blood or mucus in the stool. Denies abdominal pain, fever or vomiting.  He stopped using Metamucil (used to take regularly).  No significant changes to his diet otherwise.  Patient was admitted 12/13/2018 with left basal ganglia intracranial hemorrhage, felt to be related to hypertensive crisis. He was in inpatient rehab after hospitalization, and has seen Dr. Letta Pate in follow-up (5/12), getting home PT (just finished) and OT.  No falls since being home. He has neuro appt scheduled in 2 weeks.  Hypertension:  This has been managed by Dr. Stanford Breed, last had telemedicine visit 01/06/2019.  At that time, his BP was low (amlodipine had been added when BP very high), and there was concern for recurrent syncope (he had presyncopal event in March, resulting in multiple  lacerations).  His amlodipine dose was decreased from 10mg  to 5mg  at that visit. He continues on Avapro 300mg  daily and lopressor 25mg  BID. He reports his BP's have been very good, ranging from 120-132/60's-75.  He denies dizziness, headaches or side effects. They (Dr. Stanford Breed) had discussed implantable loop monitor at last visit, will consider if any further presyncope episodes occur.   Depression:  He was started on citalopram in December, at which time his PHQ-9 score was 12. He has noted significant improvement, and also spoke with therapist.  He reports doing much better now as far as moods. He continues to remain fatigued (last PHQ-9 score of 3 in April 2020).  Hyperlipidemia--toleratingatorvastatinwithout side effects.He follows a lowfat, low cholesterol diet. He was at goal on last check: Lab Results  Component Value Date   CHOL 152 12/12/2018   HDL 70 12/12/2018   LDLCALC 69 12/12/2018   TRIG 67 12/12/2018   CHOLHDL 2.2 12/12/2018   Hypothyroidism: Denies any symptoms--no changes in weight, moods, hair/skin. Bowels are normal.Somemilddecrease in energy/fatigue over the last few years that herelates to age. Prior to his stroke (and the COVID pandemic), his exercise has been decreasing--he was walking 30 minutes (rather than an hour), and had cut back on going to the gym from 3 days to 2 days/week.  Since CVA he was in rehab, and has been getting home PT.  He is compliant with taking his thyroidmedication on an empty stomach, separate from other medications. He had labs prior to today's visit: Lab Results  Component Value Date   TSH 3.630  01/25/2019   GERD:  He last had a flare in the Fall, when omeprazole dose was increased to 40mg  daily for a short while.  Symptoms improved pretty quickly, and he is back to taking Prilosec 20mg  every other day, and denies any further reflux/heartburn.  Impaired fasting glucose--mildly elevated at 103 on labs last year. Doesn't drink soda,  doesn't add sugar to his tea. Tries to limit his sweets. Lab Results  Component Value Date   HGBA1C 5.7 (H) 12/12/2018    Low Vitamin D level, noted to be 26 in February, 2018. Hehas beentaking 2000 IU of vitamin D3 daily and last level was normal in April 2019 (40.9)  Allergies: Previously was under the care of Dr. Orvil Feil. Went off allergy shots after 5 years of therapy, and doing well. He gets very mild seasonal allergies, mostly runny nose ("minor", year-round). He uses Atrovent nasal spray twice daily, which helps some.  Doesn't recall taking oral antihistamines.  Recent labs from hospital were also reviewed. Lab Results  Component Value Date   WBC 9.6 12/14/2018   HGB 13.9 12/14/2018   HCT 41.7 12/14/2018   MCV 85.3 12/14/2018   PLT 297 12/14/2018     Chemistry      Component Value Date/Time   NA 136 12/14/2018 0522   NA 137 12/06/2018 1013   K 3.6 12/14/2018 0522   CL 99 12/14/2018 0522   CO2 24 12/14/2018 0522   BUN 16 12/14/2018 0522   BUN 14 12/06/2018 1013   CREATININE 1.11 12/14/2018 0522   CREATININE 1.26 (H) 10/22/2016 1437      Component Value Date/Time   CALCIUM 9.5 12/14/2018 0522   ALKPHOS 58 12/14/2018 0522   AST 34 12/14/2018 0522   ALT 15 12/14/2018 0522   BILITOT 1.4 (H) 12/14/2018 0522   BILITOT 0.6 12/06/2018 1013      Immunization History  Administered Date(s) Administered  . Influenza Split 06/13/2011  . Influenza, High Dose Seasonal PF 06/09/2013, 05/23/2014, 05/14/2015, 04/23/2016, 04/28/2017, 05/13/2018  . Pneumococcal Conjugate-13 06/15/2013  . Pneumococcal Polysaccharide-23 09/02/2003, 10/03/2015  . Tdap 10/02/2005, 10/04/2015  . Zoster 05/27/2007   Last colonoscopy: 12/09, and again 10/2014 at time of fecal transplant. Skin tag and diverticulosis were noted. Last PSA: January 2016, 1.31 Dentist: once a year, past due (has lower teeth, upper dentures) Ophtho: yearly (needs new glasses, broke when he fell) Exercise: 15-20  minutes of walking, daily HEP from PT Previous routine had been:gym 2x/week, and walks 2-3 times/week for 30 minutes. At gym he uses (weights,recumbent bike, treadmill.)  Other doctors caring for patient include: Neurosurgeon: Dr. Christella Noa (no longer sees) Education officer, environmental (hemorrhoids): Dr. Redmond Pulling (no longer sees) Ophtho: [facility]: Dr. Hilarie Fredrickson  Dentist -- Dr. Orma Render Dr. Valda Favia at [facility] (audiologist) ID--Dr. Baxter Flattery CV surgeon--Dr. Roxy Manns Cardiology--Dr. Stanford Breed Allergist--Dr. Orvil Feil Orthopedist--Dr. Supple Podiatrist--Dr. Jacqualyn Posey PM&R: Dr. Letta Pate Neuro: appt pending  Depression screen: Negative Fall screen:1 due to presyncope, +facial lacerations (11/24/2018) Functional status screen: notable for using hearing aids, difficulty walking since stroke (getting stairlift installed), some trouble bathing.  Not back to driving yet. Recent fecal incontinence (see HPI). Mini-Cog screen: recalled 2/3 words.  Add'l 3 words during MD eval-still only got 2 (recalled one from earlier, not just asked).  Normal clock. Score 4/5. See full screens in epic.  End of Life Discussion: Patient hasa living will and medical power of attorney  PMH, PSH, SH and FH were reviewed/updated Recently lost his sister to coronavirus infection.  Past Medical History:  Diagnosis Date  . Allergic conjunctivitis   . Allergic rhinitis, cause unspecified    on allergy shots (Dr. Orvil Feil)  . Atherosclerosis of aorta (HCC)    noted on CT angio of abd/pelvis, in many vessels  . BPH (benign prostatic hypertrophy)   . Diverticulosis of colon 12/09  . Foot drop, right   . GERD (gastroesophageal reflux disease)   . Hearing loss in left ear    both ears now  . History of Clostridium difficile    multiple times 2016--s/p fecal transplant (Dr. Baxter Flattery).  Marland Kitchen Hx of echocardiogram    Echo (03/2014):  Mild LVH, EF 50-55%, no RWMA, Gr 1 DD, mild MR, mild reduced RVSF  . Hypertension   . Internal  hemorrhoids   . Iron deficiency anemia, unspecified 6/09  . Mitral valve prolapse    h/o; normal echo 12/2011 with no MVP seen  . 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 Right ventricular dysfunction 01/23/2014   Secondary to obstruction of main pulmonary artery  . Ruptured thoracic aortic aneurysm ([place]) 01/23/2014  . S/P ascending aortic aneurysm repair 01/24/2014  . Unspecified hypothyroidism     Past Surgical History:  Procedure Laterality Date  . CERVICAL LAMINECTOMY  1972   C5-6  . COLONOSCOPY WITH PROPOFOL N/A 11/09/2014   Procedure: COLONOSCOPY WITH PROPOFOL;  Surgeon: Jerene Bears, MD;  Location: [facility];  Service: Gastroenterology;  Laterality: N/A;  . ESOPHAGOGASTRODUODENOSCOPY  10/31/08   normal; Dr. Edison Nasuti  . FECAL TRANSPLANT N/A 11/09/2014   Procedure: FECAL TRANSPLANT;  Surgeon: Jerene Bears, MD;  Location: [facility];  Service: Gastroenterology;  Laterality: N/A;  . HEMORRHOIDECTOMY WITH HEMORRHOID BANDING  04/07/13  . hemorroidal banding  04/07/2013   x3-Dr.Eric Redmond Pulling  . PROSTATE SURGERY  2007   photovaporization  . ROTATOR CUFF REPAIR  10/2008   left; Dr. Onnie Graham  . SPINE SURGERY  2006   L4-5 disk surgery  . SPINE SURGERY  04/2013   L4-5, L5-S1 fusion.  Dr. Christella Noa  . THORACIC AORTIC ANEURYSM REPAIR N/A 01/23/2014   Procedure: THORACIC ASCENDING ANEURYSM REPAIR (AAA);  Surgeon: Rexene Alberts, MD;  Location: [facility];  Service: Open Heart Surgery;  Laterality: N/A;  . TONSILLECTOMY      Social History   Socioeconomic History  . Marital status: Married    Spouse name: Not on file  . Number of children: 2  . Years of education: Not on file  . Highest education level: Not on file  Occupational History  . Not on file  Social Needs  . Financial resource strain: Not on file  . Food insecurity:    Worry: Not on file    Inability: Not on file  . Transportation needs:    Medical: Not on file    Non-medical: Not on file  Tobacco Use  . Smoking status:  Former Smoker    Types: Cigarettes    Last attempt to quit: 09/01/1977    Years since quitting: 41.4  . Smokeless tobacco: Never Used  Substance and Sexual Activity  . Alcohol use: Yes    Comment: 2 glasses wine per day (small)  . Drug use: No  . Sexual activity: Not on file  Lifestyle  . Physical activity:    Days per week: Not on file    Minutes per session: Not on file  . Stress: Not on file  Relationships  . Social connections:    Talks on phone: Not on file    Gets together:  Not on file    Attends religious service: Not on file    Active member of club or organization: Not on file    Attends meetings of clubs or organizations: Not on file    Relationship status: Not on file  . Intimate partner violence:    Fear of current or ex partner: No    Emotionally abused: No    Physically abused: No    Forced sexual activity: No  Other Topics Concern  . Not on file  Social History Narrative   Lives with wife. 1 daughter in [place], 1 daughter in [place], [place].   Getting stairlift installed 01/2019   5 grandchildren.    Family History  Problem Relation Age of Onset  . Diabetes Mother   . Heart disease Mother        CHF  . Hypertension Mother   . Depression Mother   . Stroke Father   . Parkinsonism Father   . Hypertension Father   . Cancer Brother        bladder cancer  . Cancer Brother        prostate cancer  . Emphysema Brother   . Diabetes Sister   . Colon cancer Neg Hx     Outpatient Encounter Medications as of 01/31/2019  Medication Sig  . amLODipine (NORVASC) 5 MG tablet Take 1 tablet (5 mg total) by mouth daily.  Marland Kitchen atorvastatin (LIPITOR) 10 MG tablet Take 1 tablet (10 mg total) by mouth at bedtime.  . Cholecalciferol (VITAMIN D3) 2000 units TABS Take 2,000 Units by mouth daily.   . citalopram (CELEXA) 20 MG tablet Take 1 tablet (20 mg total) by mouth daily.  . Ferrous Sulfate (IRON) 325 (65 Fe) MG TABS Take 1 tablet (325 mg total) by mouth every other day.  Reported on 11/08/2015  . ipratropium (ATROVENT) 0.03 % nasal spray Place 2 sprays into both nostrils 2 (two) times daily as needed for rhinitis.   Marland Kitchen irbesartan (AVAPRO) 300 MG tablet Take 1 tablet (300 mg total) by mouth daily.  . metoprolol tartrate (LOPRESSOR) 25 MG tablet Take 1 tablet (25 mg total) by mouth 2 (two) times daily.  Marland Kitchen omeprazole (PRILOSEC) 20 MG capsule TAKE 1 CAPSULE BY MOUTH  EVERY OTHER DAY (Patient taking differently: Take 20 mg by mouth every other day. )  . OVER THE COUNTER MEDICATION Place 1 application into both eyes daily as needed (dry eyes/irritation). OTC eye gel  . SYNTHROID 50 MCG tablet Take 1 tablet (50 mcg total) by mouth daily before breakfast.  . acetaminophen (TYLENOL) 325 MG tablet Take 2 tablets (650 mg total) by mouth every 4 (four) hours as needed for mild pain (or temp > 37.5 C (99.5 F)). (Patient not taking: Reported on 01/31/2019)   No facility-administered encounter medications on file as of 01/31/2019.     No Known Allergies   ROS: The patient denies anorexia, fever, headaches,ear pain, hoarseness, chest pain, palpitations, dizziness, syncope, dyspnea on exertion, cough, swelling, nausea, vomiting, constipation, abdominal pain, melena, hematochezia, indigestion/heartburn, hematuria, incontinence, erectile dysfunction, nocturia, weakened urine stream, dysuria, genital lesions, joint pains, weakness, tremor, suspicious skin lesions, depression, anxiety, abnormal bleeding/bruising, or enlarged lymph nodes. Hearingloss--has bilateral hearing aids. Not working as well as in the past. Some bilateral hip pain starts after about 10-15 minutes of walking (not every day)--does some stretching before walking, which helps some.   Slight numbness in his right arm since stroke, is improving. Balance is also improving. Up 3-4x/night to void (  small volumes); denies dysuria or hematuria. Recent loose stools and fecal incontinence, per HPI. Started having some R>L  shoulder pain again (RC injury), home exercises are helping.   Observations/Objective:  BP 127/67   Pulse 61   Ht 5' 2.25" (1.581 m)   Wt 126 lb (57.2 kg)   BMI 22.86 kg/m   Wt Readings from Last 3 Encounters:  01/11/19 130 lb (59 kg)  01/06/19 125 lb (56.7 kg)  12/16/18 132 lb 11.5 oz (60.2 kg)   Exam was limited due to the virtual nature of the visit. He is alert, oriented, and in good spirits.  Cranial nerves are grossly intact. His bruising and lacerations on his face have healed well. Normal mood, affect, grooming, normal eye contact and speech.   Assessment and Plan:  Medicare annual wellness visit, subsequent  Hypothyroidism, unspecified type - adequately replaced  Depression, major, single episode, moderate ([place]) - Improved; continue citalopram  Gastroesophageal reflux disease, esophagitis presence not specified - stable/controlled  Impaired fasting glucose - low sugar and low carb diet discussed  Pure hypercholesterolemia - at goal, continue statin and low cholesterol diet  Essential hypertension - very well controlled. Aware of risks of BP being too low and too high (has had complications from both recently). Cont to monitor  Atherosclerosis of aorta (HCC)  Nontraumatic subcortical hemorrhage of left cerebral hemisphere (HCC) - L basal ganglia, due to hypertensive episode; Neuro status stable, has neuro f/u scheduled in 2 weeks.  Bowel habit changes - looser stools, poss related to stopping metamucil. Rec restart metamucil, trial of probiotics and short trial of lactose-free diet. f/u if not improving  Looser stools: Cut back on dairy for a week, try probiotics. Restart metamucil. If not better, contact us for stool studies (has h/o c.diff infections).  Consider trying loratidine (claritin) in addition to the ipratropium nasal spray, if needed for runny nose/allergies.  He denies needing refills today (checked bottles and had at least a month of lipitor  and synthroid left at home).   Discussed PSA screening (risks/benefits), not recommended based on age; recommended at least 30 minutes of aerobic activity at least 5 days/week, weight-bearing exercise at least 2x/week; proper sunscreen use reviewed; healthy diet and alcohol recommendations (less than or equal to 2 drinks/day) reviewed; regular seatbelt use; changing batteries in smoke detectors. Immunization recommendations discussed--UTD. Continue yearly flu shots. Shingrix recommended (from pharmacy); risks/side effects reviewed. Colonoscopy recommendations reviewed, UTD.  Full Code/Full Care. Has POA and living will.   F/u 6 months for med check.  Follow Up Instructions:    I discussed the assessment and treatment plan with the patient. The patient was provided an opportunity to ask questions and all were answered. The patient agreed with the plan and demonstrated an understanding of the instructions.   The patient was advised to call back or seek an in-person evaluation if the symptoms worsen or if the condition fails to improve as anticipated.  I provided 51 minutes of non-face-to-face time during this encounter.   Vikki Ports, MD  Medicare Attestation I have personally reviewed: The patient's medical and social history Their use of alcohol, tobacco or illicit drugs Their current medications and supplements The patient's functional ability including ADLs,fall risks, home safety risks, cognitive, and hearing and visual impairment Diet and physical activities Evidence for depression or mood disorders  The patient's weight, height and BMI have been recorded in the chart.  I have made referrals, counseling, and provided education to the patient based on  review of the above and I have provided the patient with a written personalized care plan for preventive services.

## 2019-01-30 NOTE — Patient Instructions (Addendum)
  HEALTH MAINTENANCE RECOMMENDATIONS:  It is recommended that you get at least 30 minutes of aerobic exercise at least 5 days/week (for weight loss, you may need as much as 60-90 minutes). This can be any activity that gets your heart rate up. This can be divided in 10-15 minute intervals if needed, but try and build up your endurance at least once a week.  Weight bearing exercise is also recommended twice weekly.  Eat a healthy diet with lots of vegetables, fruits and fiber.  "Colorful" foods have a lot of vitamins (ie green vegetables, tomatoes, red peppers, etc).  Limit sweet tea, regular sodas and alcoholic beverages, all of which has a lot of calories and sugar.  Up to 2 alcoholic drinks daily may be beneficial for men (unless trying to lose weight, watch sugars).  Drink a lot of water.  Sunscreen of at least SPF 30 should be used on all sun-exposed parts of the skin when outside between the hours of 10 am and 4 pm (not just when at beach or pool, but even with exercise, golf, tennis, and yard work!)  Use a sunscreen that says "broad spectrum" so it covers both UVA and UVB rays, and make sure to reapply every 1-2 hours.  Remember to change the batteries in your smoke detectors when changing your clock times in the spring and fall. Carbon monoxide detectors are recommended for your home.  Use your seat belt every time you are in a car, and please drive safely and not be distracted with cell phones and texting while driving.    Danny Frederick , Thank you for taking time to come for your Medicare Wellness Visit. I appreciate your ongoing commitment to your health goals. Please review the following plan we discussed and let me know if I can assist you in the future.   This is a list of the screening recommended for you and due dates:  Health Maintenance  Topic Date Due  . Flu Shot  Fall 2020  . Tetanus Vaccine  10/03/2025  . Pneumonia vaccines  Completed   I recommend getting the new shingles  vaccine (Shingrix). You will need to get this from the pharmacy, as it is covered by Medicare Part D.  It is a series of 2 injections, spaced 2 months apart.  Follow-up as scheduled with neurology and Dr. Stanford Breed. Continue to monitor blood pressures, and let Dr. Stanford Breed know if you are having consistently high or consistently low blood pressures.  We want to avoid both further strokes, and further fainting spells (both of which you have had occur in the last few months, related to extremes in blood pressure).  Intermittent diarrhea: Cut back on dairy for a week, try probiotics. Restart metamucil. If not better, contact us for stool studies (has h/o c.diff infections). Definitely let us know if you develop fever, abdominal pain, blood in the stool or weight loss.    Consider trying loratidine (claritin) in addition to the ipratropium nasal spray, if needed for runny nose/allergies.  No changes were made to your medications today.  Contact your pharmacy when refills are needed.

## 2019-01-31 ENCOUNTER — Encounter: Payer: Self-pay | Admitting: Family Medicine

## 2019-01-31 ENCOUNTER — Ambulatory Visit (INDEPENDENT_AMBULATORY_CARE_PROVIDER_SITE_OTHER): Payer: Medicare Other | Admitting: Family Medicine

## 2019-01-31 ENCOUNTER — Other Ambulatory Visit: Payer: Self-pay

## 2019-01-31 VITALS — BP 127/67 | HR 61 | Ht 62.25 in | Wt 126.0 lb

## 2019-01-31 DIAGNOSIS — R194 Change in bowel habit: Secondary | ICD-10-CM

## 2019-01-31 DIAGNOSIS — I10 Essential (primary) hypertension: Secondary | ICD-10-CM | POA: Diagnosis not present

## 2019-01-31 DIAGNOSIS — Z0000 Encounter for general adult medical examination without abnormal findings: Secondary | ICD-10-CM | POA: Diagnosis not present

## 2019-01-31 DIAGNOSIS — Z9181 History of falling: Secondary | ICD-10-CM | POA: Diagnosis not present

## 2019-01-31 DIAGNOSIS — E039 Hypothyroidism, unspecified: Secondary | ICD-10-CM | POA: Diagnosis not present

## 2019-01-31 DIAGNOSIS — R7301 Impaired fasting glucose: Secondary | ICD-10-CM | POA: Diagnosis not present

## 2019-01-31 DIAGNOSIS — I700 Atherosclerosis of aorta: Secondary | ICD-10-CM | POA: Diagnosis not present

## 2019-01-31 DIAGNOSIS — F321 Major depressive disorder, single episode, moderate: Secondary | ICD-10-CM | POA: Diagnosis not present

## 2019-01-31 DIAGNOSIS — I610 Nontraumatic intracerebral hemorrhage in hemisphere, subcortical: Secondary | ICD-10-CM | POA: Diagnosis not present

## 2019-01-31 DIAGNOSIS — I69151 Hemiplegia and hemiparesis following nontraumatic intracerebral hemorrhage affecting right dominant side: Secondary | ICD-10-CM | POA: Diagnosis not present

## 2019-01-31 DIAGNOSIS — E7800 Pure hypercholesterolemia, unspecified: Secondary | ICD-10-CM

## 2019-01-31 DIAGNOSIS — K219 Gastro-esophageal reflux disease without esophagitis: Secondary | ICD-10-CM | POA: Diagnosis not present

## 2019-02-01 NOTE — Progress Notes (Signed)
Pt is coming in Dec 3rd

## 2019-02-03 DIAGNOSIS — E039 Hypothyroidism, unspecified: Secondary | ICD-10-CM | POA: Diagnosis not present

## 2019-02-03 DIAGNOSIS — I69151 Hemiplegia and hemiparesis following nontraumatic intracerebral hemorrhage affecting right dominant side: Secondary | ICD-10-CM | POA: Diagnosis not present

## 2019-02-03 DIAGNOSIS — I10 Essential (primary) hypertension: Secondary | ICD-10-CM | POA: Diagnosis not present

## 2019-02-03 DIAGNOSIS — Z9181 History of falling: Secondary | ICD-10-CM | POA: Diagnosis not present

## 2019-02-09 ENCOUNTER — Telehealth: Payer: Self-pay

## 2019-02-09 NOTE — Telephone Encounter (Signed)
I called pt that appt on 6/16 will be change to video due to COVID 19. I stated its to minimize access to our office with patient care and medical provider. He gave verbal consent to do video and to file insurance. He advise me to send him a email with the instructions. I stated it will not be in office visit but video. PT verbalized understanding. He verified email in epic as being correct.

## 2019-02-09 NOTE — Telephone Encounter (Signed)
Video link email to pt to use with Ipad for visit.

## 2019-02-10 DIAGNOSIS — Z9181 History of falling: Secondary | ICD-10-CM | POA: Diagnosis not present

## 2019-02-10 DIAGNOSIS — I10 Essential (primary) hypertension: Secondary | ICD-10-CM | POA: Diagnosis not present

## 2019-02-10 DIAGNOSIS — E039 Hypothyroidism, unspecified: Secondary | ICD-10-CM | POA: Diagnosis not present

## 2019-02-10 DIAGNOSIS — I69151 Hemiplegia and hemiparesis following nontraumatic intracerebral hemorrhage affecting right dominant side: Secondary | ICD-10-CM | POA: Diagnosis not present

## 2019-02-11 ENCOUNTER — Encounter: Payer: Self-pay | Admitting: Adult Health

## 2019-02-15 ENCOUNTER — Other Ambulatory Visit: Payer: Self-pay

## 2019-02-15 ENCOUNTER — Ambulatory Visit (INDEPENDENT_AMBULATORY_CARE_PROVIDER_SITE_OTHER): Payer: Medicare Other | Admitting: Adult Health

## 2019-02-15 ENCOUNTER — Encounter: Payer: Self-pay | Admitting: Adult Health

## 2019-02-15 DIAGNOSIS — I610 Nontraumatic intracerebral hemorrhage in hemisphere, subcortical: Secondary | ICD-10-CM | POA: Diagnosis not present

## 2019-02-15 DIAGNOSIS — I10 Essential (primary) hypertension: Secondary | ICD-10-CM | POA: Diagnosis not present

## 2019-02-15 DIAGNOSIS — E7800 Pure hypercholesterolemia, unspecified: Secondary | ICD-10-CM

## 2019-02-15 NOTE — Progress Notes (Signed)
[facility] [address] [place]. [place] [address] [phone]       VIRTUAL VISIT FOLLOW UP NOTE  [name] Date of Birth:  [birth year] Medical Record Number:  [id number]   Reason for Referral:  hospital stroke follow up    Virtual Visit via Video Note  I connected with Danny Frederick on 02/15/19 at  1:15 PM EDT by a video enabled telemedicine application located remotely in my own home and verified that I am speaking with the correct person using two identifiers who was located at their own home and accompanied by his daughter.   Visit scheduled by Katharine Look, RN. She discussed the limitations of evaluation and management by telemedicine and the availability of in person appointments. The patient expressed understanding and agreed to proceed.Please see telephone note for additional scheduling information and consent.    CHIEF COMPLAINT:  Chief Complaint  Patient presents with  . Follow-up    Hospital stroke follow-up  . Gait Problem    Residual stroke deficit    HPI: Saxton Chain was initially scheduled today for in office hospital follow-up regarding small left BG hemorrhage likely due to hypertension on 12/11/18 but due to COVID-19 safety precautions, visit transition to telemedicine via [email or web address] with patients consent. History obtained from patient and chart review. Reviewed all radiology images and labs personally.  Mr.Danny Spauldingis a 85 y.o.malewith history of HTN, ascending aortic aneurysm repair ( 2015),hypothyroidism,andpulmonary artery thrombosiswho presented withan unsteady gait, Rt hand numbness, dizziness, elevated BP, SOB, and recent fall.Stroke work up revealed small left BG hemorrhage like due to hypertension. CT head showed acute hemorrhagic stroke/hypertensive hemorrhage involving the posterior left BG. Repeat CT head stable. 2D echo unremarkable. LDL 69 and A1c 5.7. Previously on aspirin 81mg  daily; discontinued due  to [place]. BP treated with cardene and stabalized throughout admission. Possible orthostatic hypotension with hx of syncople events and fall prior to admission. Continuation of atorvastatin 10mg  for HDL management. Other stroke risk factors include former tobacco use, ETOH use, family hx of stroke and hx of PE. Residual deficits mild RUE weakness and right facial droop. Discharged to CIR for ongoing therapy.   He has been stable from a stroke standpoint with residual deficits of gait disorder.  He has completed home therapies due to overall improvement. He continues exercises as well as daily activity. Ambulates with RW and at times is able to ambulate without for short distance. He denies recent falls. He continues to live with his wife doing all ADLs and IADLs independently. He is questioning possible return to driving. previously on aspirin 81mg  for no specific reason. He has not restarted due to [place]. Blood pressure monitored and typically 130-140/80-90. Continues on amlodipine 5mg  (decreased from 10mg  due to dizziness). Depression stable on celexa. Denies new or worsening stroke/TIA symptoms.     ROS:   14 system review of systems performed and negative with exception of gait difficulties and joint pain  PMH:  Past Medical History:  Diagnosis Date  . Allergic conjunctivitis   . Allergic rhinitis, cause unspecified    on allergy shots (Dr. Orvil Feil)  . Atherosclerosis of aorta (HCC)    noted on CT angio of abd/pelvis, in many vessels  . BPH (benign prostatic hypertrophy)   . Diverticulosis of colon 12/09  . Foot drop, right   . GERD (gastroesophageal reflux disease)   . Hearing loss in left ear    both ears now  . History of Clostridium difficile    multiple  times 2016--s/p fecal transplant (Dr. Baxter Flattery).  Marland Kitchen Hx of echocardiogram    Echo (03/2014):  Mild LVH, EF 50-55%, no RWMA, Gr 1 DD, mild MR, mild reduced RVSF  . Hypertension   . Internal hemorrhoids   . Iron deficiency anemia,  unspecified 6/09  . Mitral valve prolapse    h/o; normal echo 12/2011 with no MVP seen  . 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 Right ventricular dysfunction 01/23/2014   Secondary to obstruction of main pulmonary artery  . Ruptured thoracic aortic aneurysm ([place]) 01/23/2014  . S/P ascending aortic aneurysm repair 01/24/2014  . Unspecified hypothyroidism     PSH:  Past Surgical History:  Procedure Laterality Date  . CERVICAL LAMINECTOMY  1972   C5-6  . COLONOSCOPY WITH PROPOFOL N/A 11/09/2014   Procedure: COLONOSCOPY WITH PROPOFOL;  Surgeon: Jerene Bears, MD;  Location: [facility];  Service: Gastroenterology;  Laterality: N/A;  . ESOPHAGOGASTRODUODENOSCOPY  10/31/08   normal; Dr. Edison Nasuti  . FECAL TRANSPLANT N/A 11/09/2014   Procedure: FECAL TRANSPLANT;  Surgeon: Jerene Bears, MD;  Location: [facility];  Service: Gastroenterology;  Laterality: N/A;  . HEMORRHOIDECTOMY WITH HEMORRHOID BANDING  04/07/13  . hemorroidal banding  04/07/2013   x3-Dr.Eric Redmond Pulling  . PROSTATE SURGERY  2007   photovaporization  . ROTATOR CUFF REPAIR  10/2008   left; Dr. Onnie Graham  . SPINE SURGERY  2006   L4-5 disk surgery  . SPINE SURGERY  04/2013   L4-5, L5-S1 fusion.  Dr. Christella Noa  . THORACIC AORTIC ANEURYSM REPAIR N/A 01/23/2014   Procedure: THORACIC ASCENDING ANEURYSM REPAIR (AAA);  Surgeon: Rexene Alberts, MD;  Location: [facility];  Service: Open Heart Surgery;  Laterality: N/A;  . TONSILLECTOMY      Social History:  Social History   Socioeconomic History  . Marital status: Married    Spouse name: Not on file  . Number of children: 2  . Years of education: Not on file  . Highest education level: Not on file  Occupational History  . Not on file  Social Needs  . Financial resource strain: Not on file  . Food insecurity    Worry: Not on file    Inability: Not on file  . Transportation needs    Medical: Not on file    Non-medical: Not on file  Tobacco Use  . Smoking status: Former Smoker     Types: Cigarettes    Quit date: 09/01/1977    Years since quitting: 41.4  . Smokeless tobacco: Never Used  Substance and Sexual Activity  . Alcohol use: Yes    Comment: 2 glasses wine per day (small)  . Drug use: No  . Sexual activity: Not on file  Lifestyle  . Physical activity    Days per week: Not on file    Minutes per session: Not on file  . Stress: Not on file  Relationships  . Social Herbalist on phone: Not on file    Gets together: Not on file    Attends religious service: Not on file    Active member of club or organization: Not on file    Attends meetings of clubs or organizations: Not on file    Relationship status: Not on file  . Intimate partner violence    Fear of current or ex partner: No    Emotionally abused: No    Physically abused: No    Forced sexual activity: No  Other Topics Concern  . Not  on file  Social History Narrative   Lives with wife. 1 daughter in [place], 1 daughter in [place], [place].   Getting stairlift installed 01/2019   5 grandchildren.    Family History:  Family History  Problem Relation Age of Onset  . Diabetes Mother   . Heart disease Mother        CHF  . Hypertension Mother   . Depression Mother   . Stroke Father   . Parkinsonism Father   . Hypertension Father   . Cancer Brother        bladder cancer  . Cancer Brother        prostate cancer  . Emphysema Brother   . Diabetes Sister   . COPD Sister   . Colon cancer Neg Hx     Medications:   Current Outpatient Medications on File Prior to Visit  Medication Sig Dispense Refill  . acetaminophen (TYLENOL) 325 MG tablet Take 2 tablets (650 mg total) by mouth every 4 (four) hours as needed for mild pain (or temp > 37.5 C (99.5 F)). (Patient not taking: Reported on 01/31/2019)    . amLODipine (NORVASC) 5 MG tablet Take 1 tablet (5 mg total) by mouth daily. 90 tablet 3  . atorvastatin (LIPITOR) 10 MG tablet Take 1 tablet (10 mg total) by mouth at bedtime. 30 tablet 0   . Cholecalciferol (VITAMIN D3) 2000 units TABS Take 2,000 Units by mouth daily.     . citalopram (CELEXA) 20 MG tablet Take 1 tablet (20 mg total) by mouth daily. 90 tablet 1  . Ferrous Sulfate (IRON) 325 (65 Fe) MG TABS Take 1 tablet (325 mg total) by mouth every other day. Reported on 11/08/2015 30 each 0  . ipratropium (ATROVENT) 0.03 % nasal spray Place 2 sprays into both nostrils 2 (two) times daily as needed for rhinitis.     Marland Kitchen irbesartan (AVAPRO) 300 MG tablet Take 1 tablet (300 mg total) by mouth daily. 90 tablet 3  . metoprolol tartrate (LOPRESSOR) 25 MG tablet Take 1 tablet (25 mg total) by mouth 2 (two) times daily. 180 tablet 3  . omeprazole (PRILOSEC) 20 MG capsule TAKE 1 CAPSULE BY MOUTH  EVERY OTHER DAY (Patient taking differently: Take 20 mg by mouth every other day. ) 45 capsule 0  . OVER THE COUNTER MEDICATION Place 1 application into both eyes daily as needed (dry eyes/irritation). OTC eye gel    . SYNTHROID 50 MCG tablet Take 1 tablet (50 mcg total) by mouth daily before breakfast. 30 tablet 0   No current facility-administered medications on file prior to visit.     Allergies:  No Known Allergies   Physical Exam  Depression screen PHQ 2/9 02/15/2019  Decreased Interest 0  Down, Depressed, Hopeless 0  PHQ - 2 Score 0  Altered sleeping -  Tired, decreased energy -  Change in appetite -  Feeling bad or failure about yourself  -  Trouble concentrating -  Moving slowly or fidgety/restless -  Suicidal thoughts -  PHQ-9 Score -  Difficult doing work/chores -  Some recent data might be hidden     General: well developed, well nourished, pleasant elderly caucasian male, seated, in no evident distress Head: head normocephalic and atraumatic.    Neurologic Exam Mental Status: Awake and fully alert. Oriented to place and time. Recent and remote memory intact. Attention span, concentration and fund of knowledge appropriate. Mood and affect appropriate.  Cranial Nerves:  Extraocular movements full without nystagmus. Hearing  intact to voice. Facial sensation intact. Face, tongue, palate moves normally and symmetrically.  Shoulder shrug symmetric. Motor: No evidence of weakness per drift assessment; mild right arm decreased ROM likely due to prior rotator cuff injry Sensory.: intact to light touch Coordination: Rapid alternating movements normal in all extremities. Finger-to-nose and heel-to-shin performed accurately bilaterally except for slight right arm dysmetria - ?from prior rotator cuff injury Gait and Station: Arises from chair without difficulty. Stance is normal. Gait demonstrates normal stride length and balance .  Reflexes: UTA    NIHSS  0 Modified Rankin  2   Diagnostic Data (Labs, Imaging, Testing)  Ct Head Wo Contrast 12/11/2018 IMPRESSION:  1. Unchanged left thalamic hemorrhage.  2. Extensive chronic small vessel ischemic disease.   Ct Head Wo Contrast 12/11/2018 IMPRESSION:  1. Acute hemorrhagic stroke/hypertensive hemorrhage involving the POSTERIOR LEFT basal ganglia.No associated midline shift.  2. Moderate age related generalized atrophy and severe chronic microvascular ischemic changes of the white matter.   Dg Chest Portable 1 View 12/11/2018 IMPRESSION:  1. No acute cardiopulmonary disease.  2. Stable mild cardiomegaly without pulmonary edema.  3. Stable fibrosis involving the lung bases, LEFT greater than RIGHT.  Transthoracic Echocardiogram 1. The left ventricle has normal systolic function with an ejection fraction of 60-65%. The cavity size was normal. Left ventricular diastolic Doppler parameters are indeterminate. There is abnormal septal motion likely secondary to post-operative  state. 2. The right ventricle has normal systolic function. The cavity was normal. There is no increase in right ventricular wall thickness. 3. Right atrial size was mildly dilated. 4. The aortic root and ascending aorta are normal in  size. 5. No intracardiac thrombi or masses were visualized. 6. There is redundancy of the interatrial septum. No atrial level shunt detected by color flow Doppler.  EKG -SR rate64BPM.Question previous MI?(See cardiology reading for complete details)    ASSESSMENT: Ediel Unangst is a 85 y.o. year old male here with left BG hemorrhage on 12/11/18 secondary to HTN. Vascular risk factors include HTN, ascending aortic aneurysm s/p repair (2015), pulmonary artery thrombosis, HTN, HLD and ETOH use. Has recovered well from stroke standpoint with mild gait difficulties.     PLAN:  1. ICH : Continue lipitor  for secondary stroke prevention.  No indication to restart aspirin from a neurological standpoint as he has had no prior stroke or history of intracranial/extracranial arthrosclerosis.  Maintain strict control of hypertension with blood pressure goal below 130/90, diabetes with hemoglobin A1c goal below 6.5% and cholesterol with LDL cholesterol (bad cholesterol) goal below 70 mg/dL.  I also advised the patient to eat a healthy diet with plenty of whole grains, cereals, fruits and vegetables, exercise regularly with at least 30 minutes of continuous activity daily and maintain ideal body weight. 2. HTN: Advised to continue current treatment regimen.  Advised to continue to monitor at home along with continued follow-up with PCP for management 3. HLD: Advised to continue current treatment regimen along with continued follow-up with PCP for future prescribing and monitoring of lipid panel   Follow up in 3 months or call earlier if needed   Greater than 50% of time during this 30 minute non-face-to-face visit was spent on counseling, explanation of diagnosis of left BG ICH, reviewing risk factor management of HTN and HLD, planning of further management along with potential future management, and discussion with patient and family answering all questions.    Venancio Poisson, AGNP-BC   [facility] [address] [place],  [place] [address]  Phone [phone] Fax [phone] Note: This document was prepared with digital dictation and possible smart phrase technology. Any transcriptional errors that result from this process are unintentional.

## 2019-02-15 NOTE — Progress Notes (Signed)
I agree with the above plan 

## 2019-02-15 NOTE — Telephone Encounter (Signed)
Left vm for patient to call back to update medication list before visit at 115pm.

## 2019-02-17 ENCOUNTER — Encounter: Payer: Medicare Other | Admitting: Physical Medicine & Rehabilitation

## 2019-02-25 ENCOUNTER — Ambulatory Visit: Payer: Medicare Other | Admitting: Physical Medicine & Rehabilitation

## 2019-02-28 NOTE — Telephone Encounter (Signed)
Opened in error

## 2019-03-04 ENCOUNTER — Other Ambulatory Visit: Payer: Self-pay | Admitting: Family Medicine

## 2019-03-04 DIAGNOSIS — E039 Hypothyroidism, unspecified: Secondary | ICD-10-CM

## 2019-03-13 ENCOUNTER — Other Ambulatory Visit: Payer: Self-pay | Admitting: Family Medicine

## 2019-03-14 ENCOUNTER — Encounter: Payer: Medicare Other | Attending: Registered Nurse | Admitting: Physical Medicine & Rehabilitation

## 2019-03-14 ENCOUNTER — Other Ambulatory Visit: Payer: Self-pay

## 2019-03-14 ENCOUNTER — Encounter: Payer: Self-pay | Admitting: Physical Medicine & Rehabilitation

## 2019-03-14 VITALS — BP 159/78 | HR 61 | Temp 99.0°F | Ht 64.0 in | Wt 137.0 lb

## 2019-03-14 DIAGNOSIS — Z8052 Family history of malignant neoplasm of bladder: Secondary | ICD-10-CM | POA: Diagnosis not present

## 2019-03-14 DIAGNOSIS — Z86711 Personal history of pulmonary embolism: Secondary | ICD-10-CM | POA: Insufficient documentation

## 2019-03-14 DIAGNOSIS — Z87891 Personal history of nicotine dependence: Secondary | ICD-10-CM | POA: Diagnosis not present

## 2019-03-14 DIAGNOSIS — I10 Essential (primary) hypertension: Secondary | ICD-10-CM | POA: Diagnosis not present

## 2019-03-14 DIAGNOSIS — R269 Unspecified abnormalities of gait and mobility: Secondary | ICD-10-CM

## 2019-03-14 DIAGNOSIS — I6381 Other cerebral infarction due to occlusion or stenosis of small artery: Secondary | ICD-10-CM

## 2019-03-14 DIAGNOSIS — I69398 Other sequelae of cerebral infarction: Secondary | ICD-10-CM

## 2019-03-14 DIAGNOSIS — I639 Cerebral infarction, unspecified: Secondary | ICD-10-CM | POA: Diagnosis not present

## 2019-03-14 DIAGNOSIS — Z8249 Family history of ischemic heart disease and other diseases of the circulatory system: Secondary | ICD-10-CM | POA: Insufficient documentation

## 2019-03-14 DIAGNOSIS — Z8042 Family history of malignant neoplasm of prostate: Secondary | ICD-10-CM | POA: Insufficient documentation

## 2019-03-14 DIAGNOSIS — R278 Other lack of coordination: Secondary | ICD-10-CM | POA: Insufficient documentation

## 2019-03-14 DIAGNOSIS — Z833 Family history of diabetes mellitus: Secondary | ICD-10-CM | POA: Insufficient documentation

## 2019-03-14 NOTE — Patient Instructions (Addendum)
  Recommend  Sit to stand exercise from chair, please do next to a counter to hold if needed 3 sets of 10 reps, may do daily and increase reps or sets if needed   Please call if you decide to try Outpatient PT, OT

## 2019-03-14 NOTE — Progress Notes (Signed)
Subjective:    Patient ID: Danny Frederick, male    DOB: 03-14-1934, 85 y.o.   MRN: [id number] 85 year old right-handed male history of hypertension, BPH as well as a sending aortic aneurysm repair 2015. Lives with spouse independent prior to admission. Presented 4/11//2020 with unsteady gait and dizziness and numbness of right side. Reports of fall 3 weeks ago. Blood pressure 220/100. CT scan showed acute hemorrhage stroke hypertensive involving the posterior left basal ganglia no associated midline shift. Troponin negative. Echocardiogram with ejection fraction of 84% normal systolic function. Neurology follow-up ICH felt most likely related to hypertensive episode HPI  85 year old male with previous problems maintaining balance during gait activities who developed a hemorrhagic stroke approximately 3 months ago and completed his inpatient rehabilitation program at [facility]. Last visit 01/11/2019 "pt feels "back to normal "  Except for numbess in the right fingertips which does not affect hand function per pt report "graduated" from [facility] PT, OT  Premorbid balance issues will be discussing neuro referral with his PCP  Pain Inventory Average Pain 0 Pain Right Now 0 My pain is na  In the last 24 hours, has pain interfered with the following? General activity 0 Relation with others 0 Enjoyment of life 0 What TIME of day is your pain at its worst? na Sleep (in general) Good  Pain is worse with: na Pain improves with: na Relief from Meds: na  Mobility walk with assistance use a cane ability to climb steps?  yes do you drive?  yes  Function retired  Neuro/Psych No problems in this area  Prior Studies Any changes since last visit?  no  Physicians involved in your care Any changes since last visit?  no   Family History  Problem Relation Age of Onset  . Diabetes Mother   . Heart disease Mother        CHF  . Hypertension Mother   . Depression Mother   .  Stroke Father   . Parkinsonism Father   . Hypertension Father   . Cancer Brother        bladder cancer  . Cancer Brother        prostate cancer  . Emphysema Brother   . Diabetes Sister   . COPD Sister   . Colon cancer Neg Hx    Social History   Socioeconomic History  . Marital status: Married    Spouse name: Not on file  . Number of children: 2  . Years of education: Not on file  . Highest education level: Not on file  Occupational History  . Not on file  Social Needs  . Financial resource strain: Not on file  . Food insecurity    Worry: Not on file    Inability: Not on file  . Transportation needs    Medical: Not on file    Non-medical: Not on file  Tobacco Use  . Smoking status: Former Smoker    Types: Cigarettes    Quit date: 09/01/1977    Years since quitting: 41.5  . Smokeless tobacco: Never Used  Substance and Sexual Activity  . Alcohol use: Yes    Comment: 2 glasses wine per day (small)  . Drug use: No  . Sexual activity: Not on file  Lifestyle  . Physical activity    Days per week: Not on file    Minutes per session: Not on file  . Stress: Not on file  Relationships  . Social connections  Talks on phone: Not on file    Gets together: Not on file    Attends religious service: Not on file    Active member of club or organization: Not on file    Attends meetings of clubs or organizations: Not on file    Relationship status: Not on file  Other Topics Concern  . Not on file  Social History Narrative   Lives with wife. 1 daughter in [place], 1 daughter in [place], [place].   Getting stairlift installed 01/2019   5 grandchildren.   Past Surgical History:  Procedure Laterality Date  . CERVICAL LAMINECTOMY  1972   C5-6  . COLONOSCOPY WITH PROPOFOL N/A 11/09/2014   Procedure: COLONOSCOPY WITH PROPOFOL;  Surgeon: Jerene Bears, MD;  [facility]: [facility];  Service: Gastroenterology;  Laterality: N/A;  . ESOPHAGOGASTRODUODENOSCOPY  10/31/08   normal; Dr.  Edison Nasuti  . FECAL TRANSPLANT N/A 11/09/2014   Procedure: FECAL TRANSPLANT;  Surgeon: Jerene Bears, MD;  Location: [facility];  Service: Gastroenterology;  Laterality: N/A;  . HEMORRHOIDECTOMY WITH HEMORRHOID BANDING  04/07/13  . hemorroidal banding  04/07/2013   x3-Dr.Eric Redmond Pulling  . PROSTATE SURGERY  2007   photovaporization  . ROTATOR CUFF REPAIR  10/2008   left; Dr. Onnie Graham  . SPINE SURGERY  2006   L4-5 disk surgery  . SPINE SURGERY  04/2013   L4-5, L5-S1 fusion.  Dr. Christella Noa  . THORACIC AORTIC ANEURYSM REPAIR N/A 01/23/2014   Procedure: THORACIC ASCENDING ANEURYSM REPAIR (AAA);  Surgeon: Rexene Alberts, MD;  Location: [facility];  Service: Open Heart Surgery;  Laterality: N/A;  . TONSILLECTOMY     Past Medical History:  Diagnosis Date  . Allergic conjunctivitis   . Allergic rhinitis, cause unspecified    on allergy shots (Dr. Orvil Feil)  . Atherosclerosis of aorta (HCC)    noted on CT angio of abd/pelvis, in many vessels  . BPH (benign prostatic hypertrophy)   . Diverticulosis of colon 12/09  . Foot drop, right   . GERD (gastroesophageal reflux disease)   . Hearing loss in left ear    both ears now  . History of Clostridium difficile    multiple times 2016--s/p fecal transplant (Dr. Baxter Flattery).  Marland Kitchen Hx of echocardiogram    Echo (03/2014):  Mild LVH, EF 50-55%, no RWMA, Gr 1 DD, mild MR, mild reduced RVSF  . Hypertension   . Internal hemorrhoids   . Iron deficiency anemia, unspecified 6/09  . Mitral valve prolapse    h/o; normal echo 12/2011 with no MVP seen  . 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 Right ventricular dysfunction 01/23/2014   Secondary to obstruction of main pulmonary artery  . Ruptured thoracic aortic aneurysm ([place]) 01/23/2014  . S/P ascending aortic aneurysm repair 01/24/2014  . Unspecified hypothyroidism    BP (!) 159/78   Pulse 61   Temp 99 F (37.2 C)   Ht 5\' 4"  (1.626 m)   Wt 137 lb (62.1 kg)   SpO2 97%   BMI 23.52 kg/m   Opioid Risk Score:   Fall Risk  Score:  `1  Depression screen PHQ 2/9  Depression screen [facility] 2/9 02/15/2019 01/31/2019 01/11/2019 12/06/2018 09/13/2018 08/16/2018 12/14/2017  Decreased Interest 0 0 0 0 0 1 0  Down, Depressed, Hopeless 0 0 0 0 0 3 0  PHQ - 2 Score 0 0 0 0 0 4 0  Altered sleeping - - - 0 0 0 -  Tired, decreased energy - - - 3  3 3 -  Change in appetite - - - 0 0 2 -  Feeling bad or failure about yourself  - - - 0 0 0 -  Trouble concentrating - - - 0 0 0 -  Moving slowly or fidgety/restless - - - 0 0 0 -  Suicidal thoughts - - - 0 0 3 -  PHQ-9 Score - - - 3 3 12  -  Difficult doing work/chores - - - - - Somewhat difficult -  Some recent data might be hidden     Review of Systems  Constitutional: Negative.   HENT: Negative.   Eyes: Negative.   Respiratory: Negative.   Cardiovascular: Negative.   Gastrointestinal: Negative.   Endocrine: Negative.   Genitourinary: Negative.   Musculoskeletal: Positive for gait problem.  Skin: Negative.   Allergic/Immunologic: Negative.   Hematological: Negative.   Psychiatric/Behavioral: Negative.   All other systems reviewed and are negative.      Objective:   Physical Exam Vitals signs and nursing note reviewed.  Constitutional:      Appearance: Normal appearance.  HENT:     Head: Normocephalic and atraumatic.     Nose: Nose normal.     Mouth/Throat:     Mouth: Mucous membranes are moist.  Eyes:     Extraocular Movements: Extraocular movements intact.     Conjunctiva/sclera: Conjunctivae normal.     Pupils: Pupils are equal, round, and reactive to light.  Skin:    General: Skin is warm and dry.  Neurological:     Mental Status: He is alert and oriented to person, place, and time.     Comments: Motor strength is 4/5 in the right deltoid bicep tricep grip 4- at the right ankle dorsiflexor Remaining muscle groups are 5/5 on the left side as well as right quad and hip flexor  Moderate dysmetria right finger-nose-finger normal on the left.  Minimal  dysmetria right heel-to-shin  Psychiatric:        Mood and Affect: Mood normal.        Behavior: Behavior normal.     4+/5 RUE 5/5 RLE except 4- R ankle DF  Romberg is +  Ataxia Right FNF     Assessment & Plan:  1.  Leftbasal ganglia hypertensive hemorrhage with residual right hemiataxia and weakness.  He is now at a modified independent level and at his premorbid functional baseline according to the patient.  Does not wish to have OP PT, OT referral, we discussed that he still has coordination as well as strength deficits on the right side but he does not feel like these impact him much.  Will cont his  HEP for balance including Single leg stance, , "stick exercises" we also discussed the importance of sit to stand exercises and I have written instructions on this for him. Will have physical medicine rehab follow-up on as-needed basis.  He will follow-up with neurology as well as primary care. The patient also has follow-up appointments with his podiatrist as well as his cardiologist

## 2019-03-18 ENCOUNTER — Ambulatory Visit (INDEPENDENT_AMBULATORY_CARE_PROVIDER_SITE_OTHER): Payer: Medicare Other | Admitting: Podiatry

## 2019-03-18 ENCOUNTER — Other Ambulatory Visit: Payer: Self-pay

## 2019-03-18 ENCOUNTER — Encounter: Payer: Self-pay | Admitting: Podiatry

## 2019-03-18 DIAGNOSIS — M79672 Pain in left foot: Secondary | ICD-10-CM

## 2019-03-18 DIAGNOSIS — M79674 Pain in right toe(s): Secondary | ICD-10-CM | POA: Diagnosis not present

## 2019-03-18 DIAGNOSIS — B351 Tinea unguium: Secondary | ICD-10-CM | POA: Diagnosis not present

## 2019-03-18 DIAGNOSIS — Q828 Other specified congenital malformations of skin: Secondary | ICD-10-CM

## 2019-03-18 DIAGNOSIS — M79675 Pain in left toe(s): Secondary | ICD-10-CM | POA: Diagnosis not present

## 2019-03-18 NOTE — Patient Instructions (Signed)

## 2019-03-23 NOTE — Progress Notes (Signed)
Subjective: Danny Frederick presents to clinic with cc of painful mycotic toenails and callus b/l feet.  His pain when aggravated when weightbearing with and without shoe gear.  This pain limits his daily activities. Pain symptoms resolve with periodic professional debridement.  He states he had a stroke since his last visit. He has recovered and is working on speech deficits.  He voices no new pedal concerns on today's visit.   Current Outpatient Medications:  .  acetaminophen (TYLENOL) 325 MG tablet, Take 2 tablets (650 mg total) by mouth every 4 (four) hours as needed for mild pain (or temp > 37.5 C (99.5 F))., Disp: , Rfl:  .  amLODipine (NORVASC) 5 MG tablet, Take 1 tablet (5 mg total) by mouth daily., Disp: 90 tablet, Rfl: 3 .  atorvastatin (LIPITOR) 10 MG tablet, Take 1 tablet (10 mg total) by mouth at bedtime., Disp: 30 tablet, Rfl: 0 .  Cholecalciferol (VITAMIN D3) 2000 units TABS, Take 2,000 Units by mouth daily. , Disp: , Rfl:  .  citalopram (CELEXA) 20 MG tablet, Take 1 tablet (20 mg total) by mouth daily., Disp: 90 tablet, Rfl: 1 .  Ferrous Sulfate (IRON) 325 (65 Fe) MG TABS, Take 1 tablet (325 mg total) by mouth every other day. Reported on 11/08/2015, Disp: 30 each, Rfl: 0 .  ipratropium (ATROVENT) 0.03 % nasal spray, Place 2 sprays into both nostrils 2 (two) times daily as needed for rhinitis. , Disp: , Rfl:  .  irbesartan (AVAPRO) 300 MG tablet, Take 1 tablet (300 mg total) by mouth daily., Disp: 90 tablet, Rfl: 3 .  metoprolol tartrate (LOPRESSOR) 25 MG tablet, Take 1 tablet (25 mg total) by mouth 2 (two) times daily., Disp: 180 tablet, Rfl: 3 .  omeprazole (PRILOSEC) 20 MG capsule, TAKE 1 CAPSULE BY MOUTH  EVERY OTHER DAY, Disp: 45 capsule, Rfl: 4 .  OVER THE COUNTER MEDICATION, Place 1 application into both eyes daily as needed (dry eyes/irritation). OTC eye gel, Disp: , Rfl:  .  SYNTHROID 50 MCG tablet, TAKE 1 TABLET BY MOUTH  DAILY BEFORE BREAKFAST, Disp: 90 tablet, Rfl: 3    No Known Allergies   Objective: Physical Examination:  Vascular  Examination: Capillary refill time immediate x 10 digits.  Palpable DP/PT pulses b/l.  Digital hair present b/l.  No edema noted b/l.  Skin temperature gradient WNL b/l.  Dermatological Examination: Skin with normal turgor, texture and tone b/l.  No open wounds b/l.  No interdigital macerations noted b/l.  Elongated, thick, discolored brittle toenails with subungual debris and pain on dorsal palpation of nailbeds 1-5 b/l.  Porokeratotic lesions submet head 4 left foot devoid of thrombosed capillaries with tenderness to palpation. No erythema, no edema, no drainage, no flocculence.  Musculoskeletal Examination: Muscle strength 5/5 to all muscle groups b/l.  No pain, crepitus or joint discomfort with active/passive ROM.  Neurological Examination: Sensation intact 5/5 b/l with 10 gram monofilament.  Assessment: 1. Mycotic nail infection with pain 1-5 b/l 2. Porokeratosis submet head 4 left foot 3. Resolved verrruca submet head 4 left foot  Plan: 1. Toenails 1-5 b/l were debrided in length and girth without iatrogenic laceration. 2. Porokeratosis submet head 4 left foot pared and enucleated with sterile scalpel blade without incident. 3. Continue soft, supportive shoe gear daily. 4. Report any pedal injuries to medical professional. 5. Follow up 3 months. 6. Patient/POA to call should there be a question/concern in there interim.

## 2019-03-25 DIAGNOSIS — H40013 Open angle with borderline findings, low risk, bilateral: Secondary | ICD-10-CM | POA: Diagnosis not present

## 2019-03-25 DIAGNOSIS — H40053 Ocular hypertension, bilateral: Secondary | ICD-10-CM | POA: Diagnosis not present

## 2019-04-12 ENCOUNTER — Encounter: Payer: Self-pay | Admitting: Family Medicine

## 2019-04-19 NOTE — Progress Notes (Signed)
HPI: FU TAA repair; also with micturition syncope, HTN. Admitted 12/2013 with a contained, ruptured ascending thoracic aortic aneurysm in the setting of streptococcal pneumoniae bacteremia/aortitis. He underwent repair of the ascending thoracic aortic aneurysm with a 32 mm Hemashield straight graft. He was also followed by infectious disease and treated with antibiotics. The patient was noted to have clot in the main pulmonary artery. This was likely related to compression of the pulmonary artery by the false aneurysm. He wastreated withCoumadin. Monitor August 2018 showed sinus bradycardia to sinus rhythm with PACs and PVCs.CTA 7/19 showed stable repair of TAA.Patient having near syncopal episodes at previous office visit. Monitor November 2019 showed sinus bradycardia, normal sinus rhythm, sinus tachycardia and PACs.  Echocardiogram April 2020 showed normal LV function, mild right atrial enlargement.  Patient had non-traumatic hemorrhagic CVA April 2020. Event felt secondary to uncontrolled hypertension.  Since last seen,patient denies dyspnea, chest pain, palpitations or syncope.  Current Outpatient Medications  Medication Sig Dispense Refill  . acetaminophen (TYLENOL) 325 MG tablet Take 2 tablets (650 mg total) by mouth every 4 (four) hours as needed for mild pain (or temp > 37.5 C (99.5 F)).    Marland Kitchen amLODipine (NORVASC) 5 MG tablet Take 1 tablet (5 mg total) by mouth daily. 90 tablet 3  . atorvastatin (LIPITOR) 10 MG tablet Take 1 tablet (10 mg total) by mouth at bedtime. 30 tablet 0  . Cholecalciferol (VITAMIN D3) 2000 units TABS Take 2,000 Units by mouth daily.     . citalopram (CELEXA) 20 MG tablet Take 1 tablet (20 mg total) by mouth daily. 90 tablet 1  . Ferrous Sulfate (IRON) 325 (65 Fe) MG TABS Take 1 tablet (325 mg total) by mouth every other day. Reported on 11/08/2015 30 each 0  . ipratropium (ATROVENT) 0.03 % nasal spray Place 2 sprays into both nostrils 2 (two) times daily as  needed for rhinitis.     Marland Kitchen irbesartan (AVAPRO) 300 MG tablet Take 1 tablet (300 mg total) by mouth daily. 90 tablet 3  . metoprolol tartrate (LOPRESSOR) 25 MG tablet Take 1 tablet (25 mg total) by mouth 2 (two) times daily. 180 tablet 3  . omeprazole (PRILOSEC) 20 MG capsule TAKE 1 CAPSULE BY MOUTH  EVERY OTHER DAY 45 capsule 4  . OVER THE COUNTER MEDICATION Place 1 application into both eyes daily as needed (dry eyes/irritation). OTC eye gel    . SYNTHROID 50 MCG tablet TAKE 1 TABLET BY MOUTH  DAILY BEFORE BREAKFAST 90 tablet 3   No current facility-administered medications for this visit.      Past Medical History:  Diagnosis Date  . Allergic conjunctivitis   . Allergic rhinitis, cause unspecified    on allergy shots (Dr. Orvil Feil)  . Atherosclerosis of aorta (HCC)    noted on CT angio of abd/pelvis, in many vessels  . BPH (benign prostatic hypertrophy)   . Diverticulosis of colon 12/09  . Foot drop, right   . GERD (gastroesophageal reflux disease)   . Hearing loss in left ear    both ears now  . History of Clostridium difficile    multiple times 2016--s/p fecal transplant (Dr. Baxter Flattery).  Marland Kitchen Hx of echocardiogram    Echo (03/2014):  Mild LVH, EF 50-55%, no RWMA, Gr 1 DD, mild MR, mild reduced RVSF  . Hypertension   . Internal hemorrhoids   . Iron deficiency anemia, unspecified 6/09  . Mitral valve prolapse    h/o; normal echo 12/2011 with  no MVP seen  . 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 Right ventricular dysfunction 01/23/2014   Secondary to obstruction of main pulmonary artery  . Ruptured thoracic aortic aneurysm ([place]) 01/23/2014  . S/P ascending aortic aneurysm repair 01/24/2014  . Unspecified hypothyroidism     Past Surgical History:  Procedure Laterality Date  . CERVICAL LAMINECTOMY  1972   C5-6  . COLONOSCOPY WITH PROPOFOL N/A 11/09/2014   Procedure: COLONOSCOPY WITH PROPOFOL;  Surgeon: Jerene Bears, MD;  Location: [facility];  Service: Gastroenterology;   Laterality: N/A;  . ESOPHAGOGASTRODUODENOSCOPY  10/31/08   normal; Dr. Edison Nasuti  . FECAL TRANSPLANT N/A 11/09/2014   Procedure: FECAL TRANSPLANT;  Surgeon: Jerene Bears, MD;  Location: [facility];  Service: Gastroenterology;  Laterality: N/A;  . HEMORRHOIDECTOMY WITH HEMORRHOID BANDING  04/07/13  . hemorroidal banding  04/07/2013   x3-Dr.Eric Redmond Pulling  . PROSTATE SURGERY  2007   photovaporization  . ROTATOR CUFF REPAIR  10/2008   left; Dr. Onnie Graham  . SPINE SURGERY  2006   L4-5 disk surgery  . SPINE SURGERY  04/2013   L4-5, L5-S1 fusion.  Dr. Christella Noa  . THORACIC AORTIC ANEURYSM REPAIR N/A 01/23/2014   Procedure: THORACIC ASCENDING ANEURYSM REPAIR (AAA);  Surgeon: Rexene Alberts, MD;  Location: [facility];  Service: Open Heart Surgery;  Laterality: N/A;  . TONSILLECTOMY      Social History   Socioeconomic History  . Marital status: Married    Spouse name: Not on file  . Number of children: 2  . Years of education: Not on file  . Highest education level: Not on file  Occupational History  . Not on file  Social Needs  . Financial resource strain: Not on file  . Food insecurity    Worry: Not on file    Inability: Not on file  . Transportation needs    Medical: Not on file    Non-medical: Not on file  Tobacco Use  . Smoking status: Former Smoker    Types: Cigarettes    Quit date: 09/01/1977    Years since quitting: 41.6  . Smokeless tobacco: Never Used  Substance and Sexual Activity  . Alcohol use: Yes    Comment: 2 glasses wine per day (small)  . Drug use: No  . Sexual activity: Not on file  Lifestyle  . Physical activity    Days per week: Not on file    Minutes per session: Not on file  . Stress: Not on file  Relationships  . Social Herbalist on phone: Not on file    Gets together: Not on file    Attends religious service: Not on file    Active member of club or organization: Not on file    Attends meetings of clubs or organizations: Not on file    Relationship  status: Not on file  . Intimate partner violence    Fear of current or ex partner: No    Emotionally abused: No    Physically abused: No    Forced sexual activity: No  Other Topics Concern  . Not on file  Social History Narrative   Lives with wife. 1 daughter in [place], 1 daughter in [place], [place].   Getting stairlift installed 01/2019   5 grandchildren.    Family History  Problem Relation Age of Onset  . Diabetes Mother   . Heart disease Mother        CHF  . Hypertension Mother   .  Depression Mother   . Stroke Father   . Parkinsonism Father   . Hypertension Father   . Cancer Brother        bladder cancer  . Cancer Brother        prostate cancer  . Emphysema Brother   . Diabetes Sister   . COPD Sister   . Colon cancer Neg Hx     ROS: no fevers or chills, productive cough, hemoptysis, dysphasia, odynophagia, melena, hematochezia, dysuria, hematuria, rash, seizure activity, orthopnea, PND, pedal edema, claudication. Remaining systems are negative.  Physical Exam: Well-developed well-nourished in no acute distress.  Skin is warm and dry.  HEENT is normal.  Neck is supple.  Chest is clear to auscultation with normal expansion.  Cardiovascular exam is regular rate and rhythm.  Abdominal exam nontender or distended. No masses palpated. Extremities show no edema. neuro grossly intact  ECG-sinus rhythm at a rate of 60, left axis deviation, cannot rule out septal infarct, left atrial enlargement, lateral T wave inversion.  Personally reviewed  A/P  1 history of thoracic aortic aneurysm repair-most recent CTA revealed stable repair.  2 hypertension-patient's blood pressure is mildly elevated and he follows this at home.  I will increase amlodipine to 5 mg twice daily.  If he has any dizziness then we will decrease to 5 mg daily.  Follow and adjust regimen as needed.  3 hyperlipidemia-continue statin.  4 history of near syncope-previous monitors showed no significant  arrhythmia.  We discussed implantable loop recorder previously and he declined.  However he stated if he has recurrences he will proceed at that time.  5 prior nontraumatic hemorrhagic CVA-felt secondary to uncontrolled hypertension.  We are trying to balance keeping his blood pressure controlled versus making him dizzy.  Kirk Ruths, MD

## 2019-04-22 DIAGNOSIS — H40013 Open angle with borderline findings, low risk, bilateral: Secondary | ICD-10-CM | POA: Diagnosis not present

## 2019-04-25 ENCOUNTER — Ambulatory Visit (INDEPENDENT_AMBULATORY_CARE_PROVIDER_SITE_OTHER): Payer: Medicare Other | Admitting: Cardiology

## 2019-04-25 ENCOUNTER — Other Ambulatory Visit: Payer: Self-pay

## 2019-04-25 ENCOUNTER — Encounter: Payer: Self-pay | Admitting: Cardiology

## 2019-04-25 VITALS — BP 138/60 | HR 60 | Temp 97.2°F | Ht 61.0 in | Wt 140.0 lb

## 2019-04-25 DIAGNOSIS — I10 Essential (primary) hypertension: Secondary | ICD-10-CM

## 2019-04-25 DIAGNOSIS — Z8679 Personal history of other diseases of the circulatory system: Secondary | ICD-10-CM

## 2019-04-25 DIAGNOSIS — Z9889 Other specified postprocedural states: Secondary | ICD-10-CM

## 2019-04-25 DIAGNOSIS — R55 Syncope and collapse: Secondary | ICD-10-CM | POA: Diagnosis not present

## 2019-04-25 MED ORDER — AMLODIPINE BESYLATE 5 MG PO TABS: 5.0000 mg | ORAL_TABLET | Freq: Two times a day (BID) | ORAL | 3 refills | Status: DC

## 2019-04-25 NOTE — Patient Instructions (Signed)
Medication Instructions:  INCREASE AMLODIPINE TO 5 MG TWICE DAILY  If you need a refill on your cardiac medications before your next appointment, please call your pharmacy.   Lab work: If you have labs (blood work) drawn today and your tests are completely normal, you will receive your results only by: Marland Kitchen MyChart Message (if you have MyChart) OR . A paper copy in the mail If you have any lab test that is abnormal or we need to change your treatment, we will call you to review the results.  Follow-Up: At [facility], you and your health needs are our priority.  As part of our continuing mission to provide you with exceptional heart care, we have created designated Provider Care Teams.  These Care Teams include your primary Cardiologist (physician) and Advanced Practice Providers (APPs -  Physician Assistants and Nurse Practitioners) who all work together to provide you with the care you need, when you need it. You will need a follow up appointment in 6 months.  Please call our office 2 months in advance to schedule this appointment.  You may see Kirk Ruths MD or one of the following Advanced Practice Providers on your designated Care Team:   Kerin Ransom, PA-C Roby Lofts, [place] . Sande Rives, PA-C

## 2019-04-28 ENCOUNTER — Encounter: Payer: Self-pay | Admitting: Family Medicine

## 2019-04-28 ENCOUNTER — Other Ambulatory Visit: Payer: Self-pay | Admitting: *Deleted

## 2019-04-28 DIAGNOSIS — F321 Major depressive disorder, single episode, moderate: Secondary | ICD-10-CM

## 2019-04-28 MED ORDER — CITALOPRAM HYDROBROMIDE 20 MG PO TABS: 20.0000 mg | ORAL_TABLET | Freq: Every day | ORAL | 0 refills | Status: DC

## 2019-04-30 ENCOUNTER — Other Ambulatory Visit: Payer: Self-pay | Admitting: Family Medicine

## 2019-05-04 ENCOUNTER — Other Ambulatory Visit (INDEPENDENT_AMBULATORY_CARE_PROVIDER_SITE_OTHER): Payer: Medicare Other

## 2019-05-04 ENCOUNTER — Other Ambulatory Visit: Payer: Self-pay

## 2019-05-04 DIAGNOSIS — Z23 Encounter for immunization: Secondary | ICD-10-CM

## 2019-05-23 ENCOUNTER — Encounter: Payer: Self-pay | Admitting: Adult Health

## 2019-05-24 ENCOUNTER — Ambulatory Visit: Payer: Medicare Other | Admitting: Adult Health

## 2019-06-16 ENCOUNTER — Other Ambulatory Visit: Payer: Self-pay | Admitting: Family Medicine

## 2019-06-16 DIAGNOSIS — F321 Major depressive disorder, single episode, moderate: Secondary | ICD-10-CM

## 2019-06-16 NOTE — Telephone Encounter (Signed)
Has an appt. In Dec.

## 2019-06-17 ENCOUNTER — Other Ambulatory Visit: Payer: Self-pay

## 2019-06-17 ENCOUNTER — Encounter: Payer: Self-pay | Admitting: Podiatry

## 2019-06-17 ENCOUNTER — Ambulatory Visit (INDEPENDENT_AMBULATORY_CARE_PROVIDER_SITE_OTHER): Payer: Medicare Other | Admitting: Podiatry

## 2019-06-17 DIAGNOSIS — M79674 Pain in right toe(s): Secondary | ICD-10-CM | POA: Diagnosis not present

## 2019-06-17 DIAGNOSIS — M79675 Pain in left toe(s): Secondary | ICD-10-CM | POA: Diagnosis not present

## 2019-06-17 DIAGNOSIS — B351 Tinea unguium: Secondary | ICD-10-CM

## 2019-06-17 NOTE — Patient Instructions (Addendum)
Onychomycosis/Fungal Toenails  WHAT IS IT? An infection that lies within the keratin of your nail plate that is caused by a fungus.  WHY ME? Fungal infections affect all ages, sexes, races, and creeds.  There may be many factors that predispose you to a fungal infection such as age, coexisting medical conditions such as diabetes, or an autoimmune disease; stress, medications, fatigue, genetics, etc.  Bottom line: fungus thrives in a warm, moist environment and your shoes offer such a location.  IS IT CONTAGIOUS? Theoretically, yes.  You do not want to share shoes, nail clippers or files with someone who has fungal toenails.  Walking around barefoot in the same room or sleeping in the same bed is unlikely to transfer the organism.  It is important to realize, however, that fungus can spread easily from one nail to the next on the same foot.  HOW DO WE TREAT THIS?  There are several ways to treat this condition.  Treatment may depend on many factors such as age, medications, pregnancy, liver and kidney conditions, etc.  It is best to ask your doctor which options are available to you.  1. No treatment.   Unlike many other medical concerns, you can live with this condition.  However for many people this can be a painful condition and may lead to ingrown toenails or a bacterial infection.  It is recommended that you keep the nails cut short to help reduce the amount of fungal nail. 2. Topical treatment.  These range from herbal remedies to prescription strength nail lacquers.  About 40-50% effective, topicals require twice daily application for approximately 9 to 12 months or until an entirely new nail has grown out.  The most effective topicals are medical grade medications available through physicians offices. 3. Oral antifungal medications.  With an 80-90% cure rate, the most common oral medication requires 3 to 4 months of therapy and stays in your system for a year as the new nail grows out.  Oral  antifungal medications do require blood work to make sure it is a safe drug for you.  A liver function panel will be performed prior to starting the medication and after the first month of treatment.  It is important to have the blood work performed to avoid any harmful side effects.  In general, this medication safe but blood work is required. 4. Laser Therapy.  This treatment is performed by applying a specialized laser to the affected nail plate.  This therapy is noninvasive, fast, and non-painful.  It is not covered by insurance and is therefore, out of pocket.  The results have been very good with a 80-95% cure rate.  [facility] is the only practice in the area to offer this therapy. 5. Permanent Nail Avulsion.  Removing the entire nail so that a new nail will not grow back.  Corns and Calluses Corns are small areas of thickened skin that occur on the top, sides, or tip of a toe. They contain a cone-shaped core with a point that can press on a nerve below. This causes pain.  Calluses are areas of thickened skin that can occur anywhere on the body, including the hands, fingers, palms, soles of the feet, and heels. Calluses are usually larger than corns. What are the causes? Corns and calluses are caused by rubbing (friction) or pressure, such as from shoes that are too tight or do not fit properly. What increases the risk? Corns are more likely to develop in people   who have misshapen toes (toe deformities), such as hammer toes. Calluses can occur with friction to any area of the skin. They are more likely to develop in people who:  Work with their hands.  Wear shoes that fit poorly, are too tight, or are high-heeled.  Have toe deformities. What are the signs or symptoms? Symptoms of a corn or callus include:  A hard growth on the skin.  Pain or tenderness under the skin.  Redness and swelling.  Increased discomfort while wearing tight-fitting shoes, if your feet are  affected. If a corn or callus becomes infected, symptoms may include:  Redness and swelling that gets worse.  Pain.  Fluid, blood, or pus draining from the corn or callus. How is this diagnosed? Corns and calluses may be diagnosed based on your symptoms, your medical history, and a physical exam. How is this treated? Treatment for corns and calluses may include:  Removing the cause of the friction or pressure. This may involve: ? Changing your shoes. ? Wearing shoe inserts (orthotics) or other protective layers in your shoes, such as a corn pad. ? Wearing gloves.  Applying medicine to the skin (topical medicine) to help soften skin in the hardened, thickened areas.  Removing layers of dead skin with a file to reduce the size of the corn or callus.  Removing the corn or callus with a scalpel or laser.  Taking antibiotic medicines, if your corn or callus is infected.  Having surgery, if a toe deformity is the cause. Follow these instructions at home:   Take over-the-counter and prescription medicines only as told by your health care provider.  If you were prescribed an antibiotic, take it as told by your health care provider. Do not stop taking it even if your condition starts to improve.  Wear shoes that fit well. Avoid wearing high-heeled shoes and shoes that are too tight or too loose.  Wear any padding, protective layers, gloves, or orthotics as told by your health care provider.  Soak your hands or feet and then use a file or pumice stone to soften your corn or callus. Do this as told by your health care provider.  Check your corn or callus every day for symptoms of infection. Contact a health care provider if you:  Notice that your symptoms do not improve with treatment.  Have redness or swelling that gets worse.  Notice that your corn or callus becomes painful.  Have fluid, blood, or pus coming from your corn or callus.  Have new symptoms. Summary  Corns are  small areas of thickened skin that occur on the top, sides, or tip of a toe.  Calluses are areas of thickened skin that can occur anywhere on the body, including the hands, fingers, palms, and soles of the feet. Calluses are usually larger than corns.  Corns and calluses are caused by rubbing (friction) or pressure, such as from shoes that are too tight or do not fit properly.  Treatment may include wearing any padding, protective layers, gloves, or orthotics as told by your health care provider. This information is not intended to replace advice given to you by your health care provider. Make sure you discuss any questions you have with your health care provider. Document Released: 05/24/2004 Document Revised: 12/08/2018 Document Reviewed: 07/01/2017 Elsevier Patient Education  2020 [employer]  

## 2019-06-22 NOTE — Progress Notes (Signed)
Subjective: Danny Frederick is seen today for follow up painful calluses and elongated, thickened toenails 1-5 b/l feet that he cannot cut. Pain interferes with daily activities. Aggravating factor includes wearing enclosed shoe gear and relieved with periodic debridement.  He is s/p CVA and continuing to work on regaining his speech. He voices no new problems on today. He states his callus has resolved.  Current Outpatient Medications on File Prior to Visit  Medication Sig  . acetaminophen (TYLENOL) 325 MG tablet Take 2 tablets (650 mg total) by mouth every 4 (four) hours as needed for mild pain (or temp > 37.5 C (99.5 F)).  Marland Kitchen amLODipine (NORVASC) 5 MG tablet Take 1 tablet (5 mg total) by mouth 2 (two) times daily.  Marland Kitchen atorvastatin (LIPITOR) 10 MG tablet TAKE 1 TABLET BY MOUTH  DAILY  . Cholecalciferol (VITAMIN D3) 2000 units TABS Take 2,000 Units by mouth daily.   . citalopram (CELEXA) 20 MG tablet TAKE 1 TABLET BY MOUTH  DAILY  . Ferrous Sulfate (IRON) 325 (65 Fe) MG TABS Take 1 tablet (325 mg total) by mouth every other day. Reported on 11/08/2015  . ipratropium (ATROVENT) 0.03 % nasal spray Place 2 sprays into both nostrils 2 (two) times daily as needed for rhinitis.   Marland Kitchen irbesartan (AVAPRO) 300 MG tablet Take 1 tablet (300 mg total) by mouth daily.  . metoprolol tartrate (LOPRESSOR) 25 MG tablet Take 1 tablet (25 mg total) by mouth 2 (two) times daily.  Marland Kitchen omeprazole (PRILOSEC) 20 MG capsule TAKE 1 CAPSULE BY MOUTH  EVERY OTHER DAY  . OVER THE COUNTER MEDICATION Place 1 application into both eyes daily as needed (dry eyes/irritation). OTC eye gel  . SYNTHROID 50 MCG tablet TAKE 1 TABLET BY MOUTH  DAILY BEFORE BREAKFAST   No current facility-administered medications on file prior to visit.      No Known Allergies   Objective:  Vascular Examination: Capillary refill time immediate x 10 digits.  Dorsalis pedis present b/l.  Posterior tibial pulses present b/l.  Digital hair present x  10 digits.  Skin temperature gradient WNL b/l.   Dermatological Examination: Skin with normal turgor, texture and tone b/l.  No open wounds b/l.  Toenails 1-5 b/l discolored, thick, dystrophic with subungual debris and pain with palpation to nailbeds due to thickness of nails.  Resolved porokeratosis submet head 4 left foot.  Musculoskeletal: Muscle strength 5/5 to all LE muscle groups b/l.  No gross bony deformities b/l.  No pain, crepitus or joint limitation noted with ROM.   Neurological Examination: Protective sensation intact with 10 gram monofilament bilaterally.  Assessment: Painful onychomycosis toenails 1-5 b/l  Resolved porokeratosis submet head 4 left foot  Plan: 1. Toenails 1-5 b/l were debrided in length and girth without iatrogenic bleeding. 2. Patient to continue soft, supportive shoe gear. 3. Patient to report any pedal injuries to medical professional immediately. 4. Follow up 3 months.  5. Patient/POA to call should there be a concern in the interim.

## 2019-06-24 ENCOUNTER — Other Ambulatory Visit: Payer: Self-pay | Admitting: Cardiology

## 2019-06-24 DIAGNOSIS — I10 Essential (primary) hypertension: Secondary | ICD-10-CM

## 2019-07-13 DIAGNOSIS — J301 Allergic rhinitis due to pollen: Secondary | ICD-10-CM | POA: Diagnosis not present

## 2019-07-13 DIAGNOSIS — J3089 Other allergic rhinitis: Secondary | ICD-10-CM | POA: Diagnosis not present

## 2019-07-13 DIAGNOSIS — J3081 Allergic rhinitis due to animal (cat) (dog) hair and dander: Secondary | ICD-10-CM | POA: Diagnosis not present

## 2019-07-13 DIAGNOSIS — H1045 Other chronic allergic conjunctivitis: Secondary | ICD-10-CM | POA: Diagnosis not present

## 2019-07-29 ENCOUNTER — Other Ambulatory Visit: Payer: Self-pay | Admitting: Cardiology

## 2019-07-29 ENCOUNTER — Other Ambulatory Visit: Payer: Self-pay | Admitting: Family Medicine

## 2019-07-29 DIAGNOSIS — I10 Essential (primary) hypertension: Secondary | ICD-10-CM

## 2019-08-02 ENCOUNTER — Encounter: Payer: Self-pay | Admitting: Family Medicine

## 2019-08-02 MED ORDER — TRIAMCINOLONE ACETONIDE 0.1 % EX CREA: 1.0000 "application " | TOPICAL_CREAM | Freq: Two times a day (BID) | CUTANEOUS | 0 refills | Status: DC | PRN

## 2019-08-03 NOTE — Progress Notes (Signed)
Chief Complaint  Patient presents with  . Med check    nonfasting med check, no concerns. Patient gave me a sticky note of his bp average-127/72 for 30 days.     Patent presents for 6 month med check.  His last visit in June was done via video.  6 months ago he reported having loose stools every 3-4 days, some trouble with incontinence.   At that time he wasn't using Metamucil (used to take regularly). He is now back on Metamucil, and stools are much better.  Occasionally he still has some urgency (rare incontinence).  Pt is s/p left basal ganglia intracranial hemorrhage, felt to be related to hypertensive crisis, in 12/2018. He has done well since then.  He last saw neuro in June. He cancelled his f/u appt with neuro for September, and hasn't yet rescheduled. His balance isn't back to normal, needs to use a walker when out. He denies any falls. Doesn't have his full strength back.  He exercises daily at home.  Hypertension:  This has been managed by Dr. Stanford Breed, last saw him in August. At that visit, his amlodipine was increased to 5 mg twice daily, with instructions to decrease to 5 mg daily if he had any dizziness. He hasn't had any problems with the higher dose, is still taking it BID. He continues on Avapro 318m daily and lopressor 262mBID.  30 day average home blood pressures is 127/72, pulse 62.  Denies any significant wide variations, no highs, no lows. He denies dizziness, headaches or side effects.  Depression:  He was started on citalopram in December 2019, at which time his PHQ-9 score was 12. He has noted significant improvement, and also spoke with therapist initially.  He reports doing much better now as far as moods. He continues on citalopram and denies side effects. (last PHQ-9 score of 3 in April 2020).  Hyperlipidemia--toleratingatorvastatinwithout side effects.He follows a lowfat, low cholesterol diet. He was at goal on last check: Lab Results  Component Value Date    CHOL 152 12/12/2018   HDL 70 12/12/2018   LDLCALC 69 12/12/2018   TRIG 67 12/12/2018   CHOLHDL 2.2 12/12/2018    Hypothyroidism: Denies any symptoms--no changes in weight, moods, hair/skin. Bowels are normal.Somemilddecrease in energy/fatigue over the last few years that herelates to age. Prior to his stroke (and the COVID pandemic), his exercise has been decreasing--he was walking 30 minutes (rather than an hour), and had cut back on going to the gym from 3 days to 2 days/week.  Current exercise--walking 20 minutes daily, every day.  He is compliant with taking his thyroidmedication on an empty stomach; he take along with other medications, as always (no change; not with vitamins) Lab Results  Component Value Date   TSH 3.630 01/25/2019   GERD: He has been watering down the wine, which has helped.  He has been doing well taking 2035mod (bumps up to 74m69mily only if has a significant flare, none recently).    Impaired fasting glucose--mildly elevated at 103 on labs last year.Doesn't drink soda, doesn't add sugar to his tea. Tries to limit his sweets. Lab Results  Component Value Date   HGBA1C 5.7 (H) 12/12/2018   Low Vitamin D level, noted to be 26 in February, 2018. Last level was normal in April 2019 (40.9), when taking 2000 IU daily. He admits that he hasn't been taking D3 for the last 2-3 months.  He had run out.  Allergies: Previously was under  the care of Dr. Orvil Feil. Went off allergy shots after 5 years of therapy, and doing well. He gets very mild seasonal allergies, mostly runny nose ("minor", year-round). He no longer needs to use Atrovent nasal spray.  PMH, PSH, SH reviewed  Outpatient Encounter Medications as of 08/04/2019  Medication Sig Note  . amLODipine (NORVASC) 5 MG tablet Take 1 tablet (5 mg total) by mouth 2 (two) times daily.   Marland Kitchen atorvastatin (LIPITOR) 10 MG tablet TAKE 1 TABLET BY MOUTH  DAILY   . citalopram (CELEXA) 20 MG tablet TAKE 1 TABLET  BY MOUTH  DAILY   . Ferrous Sulfate (IRON) 325 (65 Fe) MG TABS Take 1 tablet (325 mg total) by mouth every other day. Reported on 11/08/2015   . irbesartan (AVAPRO) 300 MG tablet TAKE 1 TABLET BY MOUTH  DAILY   . metoprolol tartrate (LOPRESSOR) 25 MG tablet TAKE 1 TABLET BY MOUTH TWO  TIMES DAILY   . omeprazole (PRILOSEC) 20 MG capsule TAKE 1 CAPSULE BY MOUTH  EVERY OTHER DAY   . OVER THE COUNTER MEDICATION Place 1 application into both eyes daily as needed (dry eyes/irritation). OTC eye gel 08/04/2019: occasionally  . SYNTHROID 50 MCG tablet TAKE 1 TABLET BY MOUTH  DAILY BEFORE BREAKFAST   . triamcinolone cream (KENALOG) 0.1 % Apply 1 application topically 2 (two) times daily as needed.   Marland Kitchen acetaminophen (TYLENOL) 325 MG tablet Take 2 tablets (650 mg total) by mouth every 4 (four) hours as needed for mild pain (or temp > 37.5 C (99.5 F)). (Patient not taking: Reported on 08/04/2019)   . Cholecalciferol (VITAMIN D3) 2000 units TABS Take 2,000 Units by mouth daily.    Marland Kitchen ipratropium (ATROVENT) 0.03 % nasal spray Place 2 sprays into both nostrils 2 (two) times daily as needed for rhinitis.    . [DISCONTINUED] atorvastatin (LIPITOR) 10 MG tablet TAKE 1 TABLET BY MOUTH  DAILY   . [DISCONTINUED] metoprolol tartrate (LOPRESSOR) 25 MG tablet Take 1 tablet (25 mg total) by mouth 2 (two) times daily.    No facility-administered encounter medications on file as of 08/04/2019.    No Known Allergies   ROS: no fever, chills, URI symptoms, headaches. Denies chest pain, palpitations, shortness of breath. Denies edema. Denies numbness, tingling, weakness.  Moods are good. No changes to hair/skin/bowels/moods. +fatigue over the last few years, no significant changes. Some ongoing balance issues since stroke.   PHYSICAL EXAM:  BP 132/72   Pulse 60   Temp (!) 97.3 F (36.3 C) (Other (Comment))   Ht 5' 1"  (1.549 m)   Wt 140 lb (63.5 kg)   BMI 26.45 kg/m   Wt Readings from Last 3 Encounters:  08/04/19 140  lb (63.5 kg)  04/25/19 140 lb (63.5 kg)  03/14/19 137 lb (62.1 kg)   Well appearing, elderly male in no distress, in good spirits HEENT: conjunctiva and sclera are clear, EOMI, fundi benign. Wearing mask due to COVID-19 pandemic. Neck: no lymphadenopathy, thyromegaly or carotid bruit Heart: regular rate and rhythm, no murmur Lungs: bibasilar crackles, R>L, not significantly different from previous exams. Abdomen: soft, nontender, no mass Extremities: no edema Psych: normal mood, affect, hygiene and grooming Neuro: alert and oriented.  Cranial nerves not examined (wearing mask, EOMI).   Normal finger to nose (a little slower on the right, but due to R shoulder pain, can't reach as far). Normal strength, sensation. Hyperreflexic at left patella, other reflexes are normal and symmetric Negative Romberg. Unable to walk tandem gait--very  unsteady even just with first step. Slightly wide-based, slow gait.  PHQ-9 score of 3 (low energy daily).  ASSESSMENT/PLAN:  Hypothyroidism, unspecified type - euthyroid by history, cont current dose - Plan: TSH  Depression, major, single episode, moderate ([place]) - in remission.  Some fatigue which is not likely related to depression. Cont citalopram given ongoing stressors (pandemic, wife's dementia)  Impaired fasting glucose - reviewed proper diet, cont regular exercise - Plan: Comprehensive metabolic panel, HgB Z8T  Essential hypertension - well controlled on current regimen - Plan: Comprehensive metabolic panel  Pure hypercholesterolemia - at goal, cont current regimen - Plan: Lipid panel  Vitamin D deficiency - noncompliant with daily supplement--encouraged to restart 2000 IU daily. - Plan: Vitamin D 25 hydroxy  Medication monitoring encounter - Plan: CBC with Differential, Vitamin D 25 hydroxy, Lipid panel, Comprehensive metabolic panel, TSH   No refills are needed today.  F/u 01/2020 AWV/med check Labs prior--c-met, lipids, TSH, CBC, D,  A1c

## 2019-08-04 ENCOUNTER — Encounter: Payer: Self-pay | Admitting: Family Medicine

## 2019-08-04 ENCOUNTER — Other Ambulatory Visit: Payer: Self-pay

## 2019-08-04 ENCOUNTER — Ambulatory Visit (INDEPENDENT_AMBULATORY_CARE_PROVIDER_SITE_OTHER): Payer: Medicare Other | Admitting: Family Medicine

## 2019-08-04 VITALS — BP 132/72 | HR 60 | Temp 97.3°F | Ht 61.0 in | Wt 140.0 lb

## 2019-08-04 DIAGNOSIS — E7800 Pure hypercholesterolemia, unspecified: Secondary | ICD-10-CM

## 2019-08-04 DIAGNOSIS — E039 Hypothyroidism, unspecified: Secondary | ICD-10-CM | POA: Diagnosis not present

## 2019-08-04 DIAGNOSIS — E559 Vitamin D deficiency, unspecified: Secondary | ICD-10-CM | POA: Diagnosis not present

## 2019-08-04 DIAGNOSIS — R7301 Impaired fasting glucose: Secondary | ICD-10-CM

## 2019-08-04 DIAGNOSIS — I10 Essential (primary) hypertension: Secondary | ICD-10-CM | POA: Diagnosis not present

## 2019-08-04 DIAGNOSIS — Z5181 Encounter for therapeutic drug level monitoring: Secondary | ICD-10-CM

## 2019-08-04 DIAGNOSIS — F321 Major depressive disorder, single episode, moderate: Secondary | ICD-10-CM | POA: Diagnosis not present

## 2019-08-04 NOTE — Patient Instructions (Addendum)
Please re-start taking your vitamin D3 2000 IU once daily.  Please reschedule your neurology visit.  If your allergies worsen, or you develop a worsening runny nose, restart using the Atrovent nasal spray.   Continue all of your current medications. Contact your pharmacy when refills are needed.

## 2019-09-23 ENCOUNTER — Encounter: Payer: Self-pay | Admitting: Podiatry

## 2019-09-23 ENCOUNTER — Ambulatory Visit (INDEPENDENT_AMBULATORY_CARE_PROVIDER_SITE_OTHER): Payer: Medicare Other | Admitting: Podiatry

## 2019-09-23 ENCOUNTER — Other Ambulatory Visit: Payer: Self-pay

## 2019-09-23 DIAGNOSIS — B351 Tinea unguium: Secondary | ICD-10-CM | POA: Diagnosis not present

## 2019-09-23 DIAGNOSIS — M79674 Pain in right toe(s): Secondary | ICD-10-CM

## 2019-09-23 DIAGNOSIS — M79675 Pain in left toe(s): Secondary | ICD-10-CM | POA: Diagnosis not present

## 2019-09-23 DIAGNOSIS — Q828 Other specified congenital malformations of skin: Secondary | ICD-10-CM

## 2019-09-23 DIAGNOSIS — M79672 Pain in left foot: Secondary | ICD-10-CM

## 2019-09-23 NOTE — Patient Instructions (Addendum)
Onychomycosis/Fungal Toenails  WHAT IS IT? An infection that lies within the keratin of your nail plate that is caused by a fungus.  WHY ME? Fungal infections affect all ages, sexes, races, and creeds.  There may be many factors that predispose you to a fungal infection such as age, coexisting medical conditions such as diabetes, or an autoimmune disease; stress, medications, fatigue, genetics, etc.  Bottom line: fungus thrives in a warm, moist environment and your shoes offer such a location.  IS IT CONTAGIOUS? Theoretically, yes.  You do not want to share shoes, nail clippers or files with someone who has fungal toenails.  Walking around barefoot in the same room or sleeping in the same bed is unlikely to transfer the organism.  It is important to realize, however, that fungus can spread easily from one nail to the next on the same foot.  HOW DO WE TREAT THIS?  There are several ways to treat this condition.  Treatment may depend on many factors such as age, medications, pregnancy, liver and kidney conditions, etc.  It is best to ask your doctor which options are available to you.  1. No treatment.   Unlike many other medical concerns, you can live with this condition.  However for many people this can be a painful condition and may lead to ingrown toenails or a bacterial infection.  It is recommended that you keep the nails cut short to help reduce the amount of fungal nail. 2. Topical treatment.  These range from herbal remedies to prescription strength nail lacquers.  About 40-50% effective, topicals require twice daily application for approximately 9 to 12 months or until an entirely new nail has grown out.  The most effective topicals are medical grade medications available through physicians offices. 3. Oral antifungal medications.  With an 80-90% cure rate, the most common oral medication requires 3 to 4 months of therapy and stays in your system for a year as the new nail grows out.  Oral  antifungal medications do require blood work to make sure it is a safe drug for you.  A liver function panel will be performed prior to starting the medication and after the first month of treatment.  It is important to have the blood work performed to avoid any harmful side effects.  In general, this medication safe but blood work is required. 4. Laser Therapy.  This treatment is performed by applying a specialized laser to the affected nail plate.  This therapy is noninvasive, fast, and non-painful.  It is not covered by insurance and is therefore, out of pocket.  The results have been very good with a 80-95% cure rate.  [facility] is the only practice in the area to offer this therapy. 5. Permanent Nail Avulsion.  Removing the entire nail so that a new nail will not grow back.  Corns and Calluses Corns are small areas of thickened skin that occur on the top, sides, or tip of a toe. They contain a cone-shaped core with a point that can press on a nerve below. This causes pain.  Calluses are areas of thickened skin that can occur anywhere on the body, including the hands, fingers, palms, soles of the feet, and heels. Calluses are usually larger than corns. What are the causes? Corns and calluses are caused by rubbing (friction) or pressure, such as from shoes that are too tight or do not fit properly. What increases the risk? Corns are more likely to develop in people   have misshapen toes (toe deformities), such as hammer toes. Calluses can occur with friction to any area of the skin. They are more likely to develop in people who:  Work with their hands.  Wear shoes that fit poorly, are too tight, or are high-heeled.  Have toe deformities. What are the signs or symptoms? Symptoms of a corn or callus include:  A hard growth on the skin.  Pain or tenderness under the skin.  Redness and swelling.  Increased discomfort while wearing tight-fitting shoes, if your feet are  affected. If a corn or callus becomes infected, symptoms may include:  Redness and swelling that gets worse.  Pain.  Fluid, blood, or pus draining from the corn or callus. How is this diagnosed? Corns and calluses may be diagnosed based on your symptoms, your medical history, and a physical exam. How is this treated? Treatment for corns and calluses may include:  Removing the cause of the friction or pressure. This may involve: ? Changing your shoes. ? Wearing shoe inserts (orthotics) or other protective layers in your shoes, such as a corn pad. ? Wearing gloves.  Applying medicine to the skin (topical medicine) to help soften skin in the hardened, thickened areas.  Removing layers of dead skin with a file to reduce the size of the corn or callus.  Removing the corn or callus with a scalpel or laser.  Taking antibiotic medicines, if your corn or callus is infected.  Having surgery, if a toe deformity is the cause. Follow these instructions at home:   Take over-the-counter and prescription medicines only as told by your health care provider.  If you were prescribed an antibiotic, take it as told by your health care provider. Do not stop taking it even if your condition starts to improve.  Wear shoes that fit well. Avoid wearing high-heeled shoes and shoes that are too tight or too loose.  Wear any padding, protective layers, gloves, or orthotics as told by your health care provider.  Soak your hands or feet and then use a file or pumice stone to soften your corn or callus. Do this as told by your health care provider.  Check your corn or callus every day for symptoms of infection. Contact a health care provider if you:  Notice that your symptoms do not improve with treatment.  Have redness or swelling that gets worse.  Notice that your corn or callus becomes painful.  Have fluid, blood, or pus coming from your corn or callus.  Have new symptoms. Summary  Corns are  small areas of thickened skin that occur on the top, sides, or tip of a toe.  Calluses are areas of thickened skin that can occur anywhere on the body, including the hands, fingers, palms, and soles of the feet. Calluses are usually larger than corns.  Corns and calluses are caused by rubbing (friction) or pressure, such as from shoes that are too tight or do not fit properly.  Treatment may include wearing any padding, protective layers, gloves, or orthotics as told by your health care provider. This information is not intended to replace advice given to you by your health care provider. Make sure you discuss any questions you have with your health care provider. Document Revised: 12/08/2018 Document Reviewed: 07/01/2017 Elsevier Patient Education  2020 [employer].

## 2019-09-27 ENCOUNTER — Encounter: Payer: Self-pay | Admitting: Podiatry

## 2019-09-27 NOTE — Telephone Encounter (Signed)
Patient inquired about oral medication for onychomycosis. Informed him due to his statin use and age, I am apprehensive as this could increase his liver enzymes. Pt contacted via MyChart by RN.

## 2019-09-30 NOTE — Progress Notes (Signed)
Subjective: Danny Frederick presents today for follow up of callus(es) left foot and painful mycotic toenails b/l that are difficult to trim. Pain interferes with ambulation. Aggravating factors include wearing enclosed shoe gear. Pain is relieved with periodic professional debridement..   No Known Allergies   Objective: There were no vitals filed for this visit.  Vascular Examination:  Capillary refill time to digits immediate b/l, palpable DP pulses b/l, palpable PT pulses b/l, pedal hair present b/l and skin temperature gradient within normal limits b/l  Dermatological Examination: Pedal skin with normal turgor, texture and tone bilaterally, no open wounds bilaterally, no interdigital macerations bilaterally, toenails 1-5 b/l elongated, dystrophic, thickened, crumbly with subungual debris and porokeratotic lesion(s) submet head 4 left foot. No erythema, no edema, no drainage, no flocculence  Musculoskeletal: Normal muscle strength 5/5 to all lower extremity muscle groups bilaterally, no gross bony deformities bilaterally and no pain crepitus or joint limitation noted with ROM b/l  Neurological: Protective sensation intact 5/5 intact bilaterally with 10g monofilament b/l and vibratory sensation intact b/l  Assessment: 1. Pain due to onychomycosis of toenails of both feet   2. Porokeratosis   3. Left foot pain      Plan: -Toenails 1-5 b/l were debrided in length and girth without iatrogenic bleeding. -Painful porokeratotic lesions submet head 4 left foot pared and enucleated with sterile scalpel blade -Patient to continue soft, supportive shoe gear daily. -Patient to report any pedal injuries to medical professional immediately. -Patient/POA to call should there be question/concern in the interim.  Return in about 3 months (around 12/22/2019) for nail and callus trim.

## 2019-10-05 NOTE — Progress Notes (Signed)
Start time: 1:29 End time: 1:42  Virtual Visit via Telephone Note  I connected with Danny Frederick on 10/05/19 at  1:30 PM EST by telephone and verified that I am speaking with the correct person using two identifiers.  Location: Patient: home Provider: office  Patient unable to use video/device for visit, prefers phone.   I discussed the limitations, risks, security and privacy concerns of performing an evaluation and management service by telephone and the availability of in person appointments. I also discussed with the patient that there may be a patient responsible charge related to this service. The patient expressed understanding and agreed to proceed.   History of Present Illness:    Chief Complaint  Patient presents with  . Consult    PHONE CALL appt to discuss DNR.    Patient sent in copies of his living will and healthcare power of attorney to be scanned into his chart.  Along with the records, he sent a note stating that he wants to be DNR (not for his wife). He was asked to schedule a visit to discuss this, since our last discussion reported him being full care, with CPR and full measures (see prior AWV and MOST forms in chart).  Today he reports, that based on his age and medical history, he wants to be DNR.  He admits that he is "paranoid about being left a vegetable after stroke."  We discussed the difference between DNR and Living Will (which he submitted to [place]).    He states that being in hospital in March (stroke) was traumatic for him, and he prefers not to do that again.  We discussed that hospitalization is different than DNR (can be DNR while in the hospital), and that it is important that he make these wishes known to his daughters and wife.   Observations/Objective:  BP 140/75   Pulse 70   Ht 5\' 4"  (1.626 m)   Wt 140 lb (63.5 kg)   BMI 24.03 kg/m   He is alert, oriented, in good spirits. He answers questions appropriately and thoughtfully. He does  not sound depressed.  Assessment and Plan:  Advanced directives, counseling/discussion  DNR (do not resuscitate)  Discussion as reported above. Went into detail regarding initial resuscitative measures, and things for which it could be done and get him back to normal life (ie pneumonia, sepsis, cardiovascular issues).  He understands this, and was appreciative of the discussion.  Desires DNR. Yellow FFO, and will scan into chart and mail original to patient.   Follow Up Instructions:    I discussed the assessment and treatment plan with the patient. The patient was provided an opportunity to ask questions and all were answered. The patient agreed with the plan and demonstrated an understanding of the instructions.   The patient was advised to call back or seek an in-person evaluation if the symptoms worsen or if the condition fails to improve as anticipated.  I provided 13 minutes of non-face-to-face (telephone) time during this encounter, plus additional 5 minutes of chart review (prior MOST form) and documentation (DNR FFO)   Vikki Ports, MD

## 2019-10-06 ENCOUNTER — Ambulatory Visit (INDEPENDENT_AMBULATORY_CARE_PROVIDER_SITE_OTHER): Payer: Medicare Other | Admitting: Family Medicine

## 2019-10-06 ENCOUNTER — Other Ambulatory Visit: Payer: Self-pay

## 2019-10-06 ENCOUNTER — Encounter: Payer: Self-pay | Admitting: Family Medicine

## 2019-10-06 ENCOUNTER — Telehealth: Payer: Self-pay | Admitting: Family Medicine

## 2019-10-06 VITALS — BP 140/75 | HR 70 | Ht 64.0 in | Wt 140.0 lb

## 2019-10-06 DIAGNOSIS — Z66 Do not resuscitate: Secondary | ICD-10-CM | POA: Diagnosis not present

## 2019-10-06 DIAGNOSIS — Z7189 Other specified counseling: Secondary | ICD-10-CM

## 2019-10-06 NOTE — Patient Instructions (Signed)
Advance Directive  Advance directives are legal documents that let you make choices ahead of time about your health care and medical treatment in case you become unable to communicate for yourself. Advance directives are a way for you to make known your wishes to family, friends, and health care providers. This can let others know about your end-of-life care if you become unable to communicate. Discussing and writing advance directives should happen over time rather than all at once. Advance directives can be changed depending on your situation and what you want, even after you have signed the advance directives. There are different types of advance directives, such as:  Medical power of attorney.  Living will.  Do not resuscitate (DNR) or do not attempt resuscitation (DNAR) order. Health care proxy and medical power of attorney A health care proxy is also called a health care agent. This is a person who is appointed to make medical decisions for you in cases where you are unable to make the decisions yourself. Generally, people choose someone they know well and trust to represent their preferences. Make sure to ask this person for an agreement to act as your proxy. A proxy may have to exercise judgment in the event of a medical decision for which your wishes are not known. A medical power of attorney is a legal document that names your health care proxy. Depending on the laws in your state, after the document is written, it may also need to be:  Signed.  Notarized.  Dated.  Copied.  Witnessed.  Incorporated into your medical record. You may also want to appoint someone to manage your money in a situation in which you are unable to do so. This is called a durable power of attorney for finances. It is a separate legal document from the durable power of attorney for health care. You may choose the same person or someone different from your health care proxy to act as your agent in money  matters. If you do not appoint a proxy, or if there is a concern that the proxy is not acting in your best interests, a court may appoint a guardian to act on your behalf. Living will A living will is a set of instructions that state your wishes about medical care when you cannot express them yourself. Health care providers should keep a copy of your living will in your medical record. You may want to give a copy to family members or friends. To alert caregivers in case of an emergency, you can place a card in your wallet to let them know that you have a living will and where they can find it. A living will is used if you become:  Terminally ill.  Disabled.  Unable to communicate or make decisions. Items to consider in your living will include:  To use or not to use life-support equipment, such as dialysis machines and breathing machines (ventilators).  A DNR or DNAR order. This tells health care providers not to use cardiopulmonary resuscitation (CPR) if breathing or heartbeat stops.  To use or not to use tube feeding.  To be given or not to be given food and fluids.  Comfort (palliative) care when the goal becomes comfort rather than a cure.  Donation of organs and tissues. A living will does not give instructions for distributing your money and property if you should pass away. DNR or DNAR A DNR or DNAR order is a request not to have CPR in the event that  your heart stops beating or you stop breathing. If a DNR or DNAR order has not been made and shared, a health care provider will try to help any patient whose heart has stopped or who has stopped breathing. If you plan to have surgery, talk with your health care provider about how your DNR or DNAR order will be followed if problems occur. What if I do not have an advance directive? If you do not have an advance directive, some states assign family decision makers to act on your behalf based on how closely you are related to them. Each  state has its own laws about advance directives. You may want to check with your health care provider, attorney, or state representative about the laws in your state. Summary  Advance directives are the legal documents that allow you to make choices ahead of time about your health care and medical treatment in case you become unable to tell others about your care.  The process of discussing and writing advance directives should happen over time. You can change the advance directives, even after you have signed them.  Advance directives include DNR or DNAR orders, living wills, and designating an agent as your medical power of attorney. This information is not intended to replace advice given to you by your health care provider. Make sure you discuss any questions you have with your health care provider. Document Revised: 03/17/2019 Document Reviewed: 03/17/2019 Elsevier Patient Education  2020 [employer]  

## 2019-10-06 NOTE — Telephone Encounter (Signed)
DNR scanned per protocol and original mailed to pt per Dr. Johnsie Kindred instructions.

## 2019-10-19 NOTE — Progress Notes (Signed)
HPI: FU TAA repair; also with micturition syncope, HTN. Admitted 12/2013 with a contained, ruptured ascending thoracic aortic aneurysm in the setting of streptococcal pneumoniae bacteremia/aortitis. He underwent repair of the ascending thoracic aortic aneurysm with a 32 mm Hemashield straight graft. He was also followed by infectious disease and treated with antibiotics. The patient was noted to have clot in the main pulmonary artery. This was likely related to compression of the pulmonary artery by the false aneurysm. He wastreated withCoumadin. Monitor August 2018 showed sinus bradycardia to sinus rhythm with PACs and PVCs.CTA 7/19 showed stable repair of TAA.Patient having near syncopal episodes at previous office visit. Monitor November 2019 showed sinus bradycardia, normal sinus rhythm, sinus tachycardia and PACs. Echocardiogram April 2020 showed normal LV function, mild right atrial enlargement.Patient had non-traumatic hemorrhagic CVA April 2020. Eventfelt secondary to uncontrolled hypertension. Since last seen,there is some dyspnea on exertion but no orthopnea, PND, pedal edema, chest pain or syncope.  Current Outpatient Medications  Medication Sig Dispense Refill  . amLODipine (NORVASC) 5 MG tablet Take 1 tablet (5 mg total) by mouth 2 (two) times daily. 180 tablet 3  . atorvastatin (LIPITOR) 10 MG tablet TAKE 1 TABLET BY MOUTH  DAILY 90 tablet 1  . Cholecalciferol (VITAMIN D3) 2000 units TABS Take 2,000 Units by mouth daily.     . citalopram (CELEXA) 20 MG tablet TAKE 1 TABLET BY MOUTH  DAILY 90 tablet 0  . Ferrous Sulfate (IRON) 325 (65 Fe) MG TABS Take 1 tablet (325 mg total) by mouth every other day. Reported on 11/08/2015 30 each 0  . ipratropium (ATROVENT) 0.03 % nasal spray Place 2 sprays into both nostrils 2 (two) times daily as needed for rhinitis.     Marland Kitchen irbesartan (AVAPRO) 300 MG tablet TAKE 1 TABLET BY MOUTH  DAILY 90 tablet 3  . metoprolol tartrate (LOPRESSOR) 25  MG tablet TAKE 1 TABLET BY MOUTH TWO  TIMES DAILY 180 tablet 2  . mupirocin ointment (BACTROBAN) 2 % mupirocin 2 % topical ointment    . omeprazole (PRILOSEC) 20 MG capsule TAKE 1 CAPSULE BY MOUTH  EVERY OTHER DAY 45 capsule 4  . OVER THE COUNTER MEDICATION Place 1 application into both eyes daily as needed (dry eyes/irritation). OTC eye gel    . SYNTHROID 50 MCG tablet TAKE 1 TABLET BY MOUTH  DAILY BEFORE BREAKFAST 90 tablet 3  . triamcinolone cream (KENALOG) 0.1 % Apply 1 application topically 2 (two) times daily as needed. 454 g 0   No current facility-administered medications for this visit.     Past Medical History:  Diagnosis Date  . Allergic conjunctivitis   . Allergic rhinitis, cause unspecified    on allergy shots (Dr. Orvil Feil)  . Atherosclerosis of aorta (HCC)    noted on CT angio of abd/pelvis, in many vessels  . BPH (benign prostatic hypertrophy)   . Diverticulosis of colon 12/09  . Foot drop, right   . GERD (gastroesophageal reflux disease)   . Hearing loss in left ear    both ears now  . History of Clostridium difficile    multiple times 2016--s/p fecal transplant (Dr. Baxter Flattery).  Marland Kitchen Hx of echocardiogram    Echo (03/2014):  Mild LVH, EF 50-55%, no RWMA, Gr 1 DD, mild MR, mild reduced RVSF  . Hypertension   . Internal hemorrhoids   . Iron deficiency anemia, unspecified 6/09  . Mitral valve prolapse    h/o; normal echo 12/2011 with no MVP seen  .  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 Right ventricular dysfunction 01/23/2014   Secondary to obstruction of main pulmonary artery  . Ruptured thoracic aortic aneurysm ([place]) 01/23/2014  . S/P ascending aortic aneurysm repair 01/24/2014  . Unspecified hypothyroidism     Past Surgical History:  Procedure Laterality Date  . CERVICAL LAMINECTOMY  1972   C5-6  . COLONOSCOPY WITH PROPOFOL N/A 11/09/2014   Procedure: COLONOSCOPY WITH PROPOFOL;  Surgeon: Jerene Bears, MD;  Location: [facility];  Service: Gastroenterology;   Laterality: N/A;  . ESOPHAGOGASTRODUODENOSCOPY  10/31/08   normal; Dr. Edison Nasuti  . FECAL TRANSPLANT N/A 11/09/2014   Procedure: FECAL TRANSPLANT;  Surgeon: Jerene Bears, MD;  Location: [facility];  Service: Gastroenterology;  Laterality: N/A;  . HEMORRHOIDECTOMY WITH HEMORRHOID BANDING  04/07/13  . hemorroidal banding  04/07/2013   x3-Dr.Eric Redmond Pulling  . PROSTATE SURGERY  2007   photovaporization  . ROTATOR CUFF REPAIR  10/2008   left; Dr. Onnie Graham  . SPINE SURGERY  2006   L4-5 disk surgery  . SPINE SURGERY  04/2013   L4-5, L5-S1 fusion.  Dr. Christella Noa  . THORACIC AORTIC ANEURYSM REPAIR N/A 01/23/2014   Procedure: THORACIC ASCENDING ANEURYSM REPAIR (AAA);  Surgeon: Rexene Alberts, MD;  Location: [facility];  Service: Open Heart Surgery;  Laterality: N/A;  . TONSILLECTOMY      Social History   Socioeconomic History  . Marital status: Married    Spouse name: Not on file  . Number of children: 2  . Years of education: Not on file  . Highest education level: Not on file  Occupational History  . Not on file  Tobacco Use  . Smoking status: Former Smoker    Types: Cigarettes    Quit date: 09/01/1977    Years since quitting: 42.1  . Smokeless tobacco: Never Used  Substance and Sexual Activity  . Alcohol use: Yes    Comment: 2 glasses wine per day (small)  . Drug use: No  . Sexual activity: Not on file  Other Topics Concern  . Not on file  Social History Narrative   Lives with wife. 1 daughter in [place], 1 daughter in [place], [place].   Getting stairlift installed 01/2019   5 grandchildren.   Social Determinants of Health   Financial Resource Strain:   . Difficulty of Paying Living Expenses: Not on file  Food Insecurity:   . Worried About Charity fundraiser in the Last Year: Not on file  . Ran Out of Food in the Last Year: Not on file  Transportation Needs:   . Lack of Transportation (Medical): Not on file  . Lack of Transportation (Non-Medical): Not on file  Physical Activity:   .  Days of Exercise per Week: Not on file  . Minutes of Exercise per Session: Not on file  Stress:   . Feeling of Stress : Not on file  Social Connections:   . Frequency of Communication with Friends and Family: Not on file  . Frequency of Social Gatherings with Friends and Family: Not on file  . Attends Religious Services: Not on file  . Active Member of Clubs or Organizations: Not on file  . Attends Archivist Meetings: Not on file  . Marital Status: Not on file  Intimate Partner Violence: Not At Risk  . Fear of Current or Ex-Partner: No  . Emotionally Abused: No  . Physically Abused: No  . Sexually Abused: No    Family History  Problem Relation  Age of Onset  . Diabetes Mother   . Heart disease Mother        CHF  . Hypertension Mother   . Depression Mother   . Stroke Father   . Parkinsonism Father   . Hypertension Father   . Cancer Brother        bladder cancer  . Cancer Brother        prostate cancer  . Emphysema Brother   . Diabetes Sister   . COPD Sister   . Colon cancer Neg Hx     ROS: Gait instability but no fevers or chills, productive cough, hemoptysis, dysphasia, odynophagia, melena, hematochezia, dysuria, hematuria, rash, seizure activity, orthopnea, PND, pedal edema, claudication. Remaining systems are negative.  Physical Exam: Well-developed well-nourished in no acute distress.  Skin is warm and dry.  HEENT is normal.  Neck is supple.  Chest is clear to auscultation with normal expansion.  Cardiovascular exam is regular rate and rhythm.  Abdominal exam nontender or distended. No masses palpated. Extremities show no edema. neuro grossly intact  A/P  1 history of thoracic aortic aneurysm repair-most recent CTA stable.  2 hypertension-blood pressure controlled.  Continue present medications and follow.  3 hyperlipidemia-continue statin.  4 history of nontraumatic hemorrhagic CVA-this was felt secondary to uncontrolled hypertension.  Continue  aggressive blood pressure control.  Note he had some difficulties with dizziness when we increased his blood pressure medications previously.  We will try to balance the symptoms with optimal control.  5 history of near syncope-previous monitor is unremarkable.  If he has recurrences will consider implantable loop monitor (he declined previously but would consider if he has further episodes in the future).  Kirk Ruths, MD

## 2019-10-25 ENCOUNTER — Ambulatory Visit (INDEPENDENT_AMBULATORY_CARE_PROVIDER_SITE_OTHER): Payer: Medicare Other | Admitting: Cardiology

## 2019-10-25 ENCOUNTER — Encounter: Payer: Self-pay | Admitting: Cardiology

## 2019-10-25 ENCOUNTER — Other Ambulatory Visit: Payer: Self-pay

## 2019-10-25 VITALS — BP 100/52 | HR 68 | Ht 64.0 in | Wt 141.0 lb

## 2019-10-25 DIAGNOSIS — Z9889 Other specified postprocedural states: Secondary | ICD-10-CM

## 2019-10-25 DIAGNOSIS — I10 Essential (primary) hypertension: Secondary | ICD-10-CM | POA: Diagnosis not present

## 2019-10-25 DIAGNOSIS — Z8679 Personal history of other diseases of the circulatory system: Secondary | ICD-10-CM

## 2019-10-25 DIAGNOSIS — E7800 Pure hypercholesterolemia, unspecified: Secondary | ICD-10-CM | POA: Diagnosis not present

## 2019-10-25 NOTE — Patient Instructions (Signed)
Medication Instructions:  NO CHANGE *If you need a refill on your cardiac medications before your next appointment, please call your pharmacy*  Lab Work: Your physician recommends that you HAVE LAB WORK TODAY If you have labs (blood work) drawn today and your tests are completely normal, you will receive your results only by: . MyChart Message (if you have MyChart) OR . A paper copy in the mail If you have any lab test that is abnormal or we need to change your treatment, we will call you to review the results.  Follow-Up: At [facility], you and your health needs are our priority.  As part of our continuing mission to provide you with exceptional heart care, we have created designated Provider Care Teams.  These Care Teams include your primary Cardiologist (physician) and Advanced Practice Providers (APPs -  Physician Assistants and Nurse Practitioners) who all work together to provide you with the care you need, when you need it.  Your next appointment:   12 month(s)  The format for your next appointment:   Either In Person or Virtual  Provider:   You may see BRIAN CRENSHAW MD or one of the following Advanced Practice Providers on your designated Care Team:    Luke Kilroy, PA-C  Callie Goodrich, PA-C  Jesse Cleaver, FNP    

## 2019-10-26 LAB — COMPREHENSIVE METABOLIC PANEL
ALT: 11 IU/L (ref 0–44)
AST: 23 IU/L (ref 0–40)
Albumin/Globulin Ratio: 1.1 — ABNORMAL LOW (ref 1.2–2.2)
Albumin: 4.1 g/dL (ref 3.6–4.6)
Alkaline Phosphatase: 77 IU/L (ref 39–117)
BUN/Creatinine Ratio: 15 (ref 10–24)
BUN: 24 mg/dL (ref 8–27)
Bilirubin Total: 0.8 mg/dL (ref 0.0–1.2)
CO2: 23 mmol/L (ref 20–29)
Calcium: 9.9 mg/dL (ref 8.6–10.2)
Chloride: 97 mmol/L (ref 96–106)
Creatinine, Ser: 1.56 mg/dL — ABNORMAL HIGH (ref 0.76–1.27)
GFR calc Af Amer: 47 mL/min/{1.73_m2} — ABNORMAL LOW (ref >59)
GFR calc non Af Amer: 40 mL/min/{1.73_m2} — ABNORMAL LOW (ref >59)
Globulin, Total: 3.8 g/dL (ref 1.5–4.5)
Glucose: 73 mg/dL (ref 65–99)
Potassium: 5 mmol/L (ref 3.5–5.2)
Sodium: 136 mmol/L (ref 134–144)
Total Protein: 7.9 g/dL (ref 6.0–8.5)

## 2019-10-26 LAB — LIPID PANEL
Chol/HDL Ratio: 2.2 ratio (ref 0.0–5.0)
Cholesterol, Total: 151 mg/dL (ref 100–199)
HDL: 68 mg/dL (ref >39)
LDL Chol Calc (NIH): 65 mg/dL (ref 0–99)
Triglycerides: 99 mg/dL (ref 0–149)
VLDL Cholesterol Cal: 18 mg/dL (ref 5–40)

## 2019-10-27 ENCOUNTER — Other Ambulatory Visit: Payer: Self-pay | Admitting: Family Medicine

## 2019-10-27 DIAGNOSIS — F321 Major depressive disorder, single episode, moderate: Secondary | ICD-10-CM

## 2019-10-31 ENCOUNTER — Encounter: Payer: Self-pay | Admitting: Family Medicine

## 2019-12-05 DIAGNOSIS — H25812 Combined forms of age-related cataract, left eye: Secondary | ICD-10-CM | POA: Diagnosis not present

## 2019-12-05 DIAGNOSIS — H40003 Preglaucoma, unspecified, bilateral: Secondary | ICD-10-CM | POA: Diagnosis not present

## 2019-12-05 DIAGNOSIS — Z01818 Encounter for other preprocedural examination: Secondary | ICD-10-CM | POA: Diagnosis not present

## 2019-12-05 DIAGNOSIS — H25811 Combined forms of age-related cataract, right eye: Secondary | ICD-10-CM | POA: Diagnosis not present

## 2019-12-06 DIAGNOSIS — H1013 Acute atopic conjunctivitis, bilateral: Secondary | ICD-10-CM | POA: Diagnosis not present

## 2019-12-09 DIAGNOSIS — H1013 Acute atopic conjunctivitis, bilateral: Secondary | ICD-10-CM | POA: Diagnosis not present

## 2019-12-19 DIAGNOSIS — H1013 Acute atopic conjunctivitis, bilateral: Secondary | ICD-10-CM | POA: Diagnosis not present

## 2019-12-23 ENCOUNTER — Other Ambulatory Visit: Payer: Self-pay

## 2019-12-23 ENCOUNTER — Encounter (HOSPITAL_COMMUNITY): Payer: Self-pay

## 2019-12-23 ENCOUNTER — Emergency Department (HOSPITAL_COMMUNITY): Payer: Medicare Other

## 2019-12-23 ENCOUNTER — Emergency Department (HOSPITAL_COMMUNITY)
Admission: EM | Admit: 2019-12-23 | Discharge: 2019-12-23 | Disposition: A | Discharge status: Home / Self Care | Payer: Medicare Other | Attending: Emergency Medicine | Admitting: Emergency Medicine

## 2019-12-23 DIAGNOSIS — Y999 Unspecified external cause status: Secondary | ICD-10-CM | POA: Diagnosis not present

## 2019-12-23 DIAGNOSIS — R001 Bradycardia, unspecified: Secondary | ICD-10-CM | POA: Diagnosis not present

## 2019-12-23 DIAGNOSIS — S060X0A Concussion without loss of consciousness, initial encounter: Secondary | ICD-10-CM | POA: Insufficient documentation

## 2019-12-23 DIAGNOSIS — W010XXA Fall on same level from slipping, tripping and stumbling without subsequent striking against object, initial encounter: Secondary | ICD-10-CM | POA: Diagnosis not present

## 2019-12-23 DIAGNOSIS — Y9301 Activity, walking, marching and hiking: Secondary | ICD-10-CM | POA: Insufficient documentation

## 2019-12-23 DIAGNOSIS — I10 Essential (primary) hypertension: Secondary | ICD-10-CM | POA: Diagnosis not present

## 2019-12-23 DIAGNOSIS — Y929 Unspecified place or not applicable: Secondary | ICD-10-CM | POA: Diagnosis not present

## 2019-12-23 DIAGNOSIS — W19XXXA Unspecified fall, initial encounter: Secondary | ICD-10-CM

## 2019-12-23 DIAGNOSIS — Z87891 Personal history of nicotine dependence: Secondary | ICD-10-CM | POA: Diagnosis not present

## 2019-12-23 DIAGNOSIS — Z79899 Other long term (current) drug therapy: Secondary | ICD-10-CM | POA: Insufficient documentation

## 2019-12-23 DIAGNOSIS — S0990XA Unspecified injury of head, initial encounter: Secondary | ICD-10-CM | POA: Diagnosis not present

## 2019-12-23 MED ORDER — SODIUM CHLORIDE 0.9% FLUSH: 3.0000 mL | Freq: Once | INTRAVENOUS | Status: DC

## 2019-12-23 NOTE — ED Triage Notes (Signed)
Pt lives with spouse, she has dementia. LKW 1800 yesterday when daughter spoke to him on the phone.

## 2019-12-23 NOTE — ED Triage Notes (Signed)
Pt reports he was scheduled for cataract surgery this am. Daughter dropped him off at the door, parked the car and as she was walking in pt was on the ground with several people around him. A witness reports pt tripped and fell hard, pt denies remembering the fall, disoriented to time and surroundings, alert to self and dr only. Unsure of why he was there at the facility. Pt alert and oriented now. Pt does not take blood thinners, some swelling and small abrasion under Right eye.

## 2019-12-23 NOTE — ED Notes (Signed)
Pt verbalizes understanding of d/c instructions. Pt taken to lobby via wheelchair at d/c with all belongings and with family.   

## 2019-12-23 NOTE — ED Notes (Signed)
Pt transported to CT ?

## 2019-12-23 NOTE — ED Provider Notes (Signed)
[facility] EMERGENCY DEPARTMENT Provider Note   CSN: [id number] Arrival date & time: 12/23/19  [id number]     History Chief Complaint  Patient presents with  . Weakness  . Near Syncope    Danny Frederick is a 86 y.o. male.  HPI Patient presents after a fall.  Presents with daughter.  Was walking into get his cataract surgery when he tripped and fell forward.  Had some confusion after.  Patient states he does not remember the event.  He is not on anticoagulation.  No neck pain.  Patient's daughter states that he is back at his baseline.  No neck pain.  No extremity or spine tenderness or pain.    Past Medical History:  Diagnosis Date  . Allergic conjunctivitis   . Allergic rhinitis, cause unspecified    on allergy shots (Dr. Orvil Feil)  . Atherosclerosis of aorta (HCC)    noted on CT angio of abd/pelvis, in many vessels  . BPH (benign prostatic hypertrophy)   . Diverticulosis of colon 12/09  . Foot drop, right   . GERD (gastroesophageal reflux disease)   . Hearing loss in left ear    both ears now  . History of Clostridium difficile    multiple times 2016--s/p fecal transplant (Dr. Baxter Flattery).  Marland Kitchen Hx of echocardiogram    Echo (03/2014):  Mild LVH, EF 50-55%, no RWMA, Gr 1 DD, mild MR, mild reduced RVSF  . Hypertension   . Internal hemorrhoids   . Iron deficiency anemia, unspecified 6/09  . Mitral valve prolapse    h/o; normal echo 12/2011 with no MVP seen  . 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 Right ventricular dysfunction 01/23/2014   Secondary to obstruction of main pulmonary artery  . Ruptured thoracic aortic aneurysm ([place]) 01/23/2014  . S/P ascending aortic aneurysm repair 01/24/2014  . Unspecified hypothyroidism     Patient Active Problem List   Diagnosis Date Noted  . DNR (do not resuscitate) 10/06/2019  . Hyperlipidemia 12/13/2018  . Family hx-stroke 12/13/2018  . ICH (intracerebral hemorrhage) (HCC) - L BG 12/11/2018  . Lip laceration  12/07/2018  . Impaired fasting glucose 12/06/2018  . Spinal stenosis of lumbar region 09/08/2017  . Atherosclerosis of aorta ([place]) 11/08/2015  . Recurrent Clostridium difficile diarrhea   . Bacteremia   . S/P ascending aortic aneurysm repair 01/24/2014  . Ruptured thoracic aortic aneurysm ([place]) 01/23/2014  . 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 Right ventricular dysfunction 01/23/2014  . Solitary pulmonary nodule 12/08/2013  . Chest pain 12/02/2013  . Allergic conjunctivitis 10/20/2012  . Syncope 01/22/2012  . Hypothyroidism 01/22/2011  . Essential hypertension, benign 01/22/2011  . GERD (gastroesophageal reflux disease) 01/22/2011  . Iron deficiency anemia 01/22/2011  . IRON DEFICIENCY ANEMIA SECONDARY TO BLOOD LOSS 11/08/2008  . ANEMIA 05/26/2008  . Essential hypertension 03/02/2008  . DYSPNEA 03/02/2008    Past Surgical History:  Procedure Laterality Date  . CERVICAL LAMINECTOMY  1972   C5-6  . COLONOSCOPY WITH PROPOFOL N/A 11/09/2014   Procedure: COLONOSCOPY WITH PROPOFOL;  Surgeon: Jerene Bears, MD;  Location: [facility];  Service: Gastroenterology;  Laterality: N/A;  . ESOPHAGOGASTRODUODENOSCOPY  10/31/08   normal; Dr. Edison Nasuti  . FECAL TRANSPLANT N/A 11/09/2014   Procedure: FECAL TRANSPLANT;  Surgeon: Jerene Bears, MD;  Location: [facility];  Service: Gastroenterology;  Laterality: N/A;  . HEMORRHOIDECTOMY WITH HEMORRHOID BANDING  04/07/13  . hemorroidal banding  04/07/2013   x3-Dr.Eric Redmond Pulling  . PROSTATE SURGERY  2007  photovaporization  . ROTATOR CUFF REPAIR  10/2008   left; Dr. Onnie Graham  . SPINE SURGERY  2006   L4-5 disk surgery  . SPINE SURGERY  04/2013   L4-5, L5-S1 fusion.  Dr. Christella Noa  . THORACIC AORTIC ANEURYSM REPAIR N/A 01/23/2014   Procedure: THORACIC ASCENDING ANEURYSM REPAIR (AAA);  Surgeon: Rexene Alberts, MD;  Location: [place];  Service: Open Heart Surgery;  Laterality: N/A;  . TONSILLECTOMY         Family History  Problem Relation Age of  Onset  . Diabetes Mother   . Heart disease Mother        CHF  . Hypertension Mother   . Depression Mother   . Stroke Father   . Parkinsonism Father   . Hypertension Father   . Cancer Brother        bladder cancer  . Cancer Brother        prostate cancer  . Emphysema Brother   . Diabetes Sister   . COPD Sister   . Colon cancer Neg Hx     Social History   Tobacco Use  . Smoking status: Former Smoker    Types: Cigarettes    Quit date: 09/01/1977    Years since quitting: 42.3  . Smokeless tobacco: Never Used  Substance Use Topics  . Alcohol use: Yes    Comment: 2 glasses wine per day (small)  . Drug use: No    Home Medications Prior to Admission medications   Medication Sig Start Date End Date Taking? Authorizing Provider  amLODipine (NORVASC) 5 MG tablet Take 1 tablet (5 mg total) by mouth 2 (two) times daily. 04/25/19   Lelon Perla, MD  atorvastatin (LIPITOR) 10 MG tablet TAKE 1 TABLET BY MOUTH  DAILY 07/29/19   Rita Ohara, MD  Cholecalciferol (VITAMIN D3) 2000 units TABS Take 2,000 Units by mouth daily.     [provider]  citalopram (CELEXA) 20 MG tablet TAKE 1 TABLET BY MOUTH  DAILY 10/27/19   Rita Ohara, MD  Ferrous Sulfate (IRON) 325 (65 Fe) MG TABS Take 1 tablet (325 mg total) by mouth every other day. Reported on 11/08/2015 12/28/18   Angiulli, Lavon Paganini, PA-C  ipratropium (ATROVENT) 0.03 % nasal spray Place 2 sprays into both nostrils 2 (two) times daily as needed for rhinitis.  10/11/18   [provider]  irbesartan (AVAPRO) 300 MG tablet TAKE 1 TABLET BY MOUTH  DAILY 06/28/19   Lelon Perla, MD  metoprolol tartrate (LOPRESSOR) 25 MG tablet TAKE 1 TABLET BY MOUTH TWO  TIMES DAILY 07/29/19   Lelon Perla, MD  mupirocin ointment (BACTROBAN) 2 % mupirocin 2 % topical ointment    [provider]  omeprazole (PRILOSEC) 20 MG capsule TAKE 1 CAPSULE BY MOUTH  EVERY OTHER DAY 03/14/19   Rita Ohara, MD  OVER THE COUNTER MEDICATION Place 1  application into both eyes daily as needed (dry eyes/irritation). OTC eye gel    [provider]  SYNTHROID 50 MCG tablet TAKE 1 TABLET BY MOUTH  DAILY BEFORE BREAKFAST 03/04/19   Rita Ohara, MD  triamcinolone cream (KENALOG) 0.1 % Apply 1 application topically 2 (two) times daily as needed. 08/02/19   Rita Ohara, MD    Allergies    Patient has no known allergies.  Review of Systems   Review of Systems  Constitutional: Negative for appetite change.  HENT: Negative for congestion.   Respiratory: Negative for shortness of breath.   Cardiovascular: Negative  for chest pain.  Gastrointestinal: Negative for abdominal pain.  Genitourinary: Negative for frequency.  Musculoskeletal: Negative for back pain.  Skin: Negative for wound.  Neurological: Negative for speech difficulty and headaches.  Psychiatric/Behavioral: Negative for confusion.    Physical Exam Updated Vital Signs BP (!) 145/71   Pulse (!) 58   Temp 97.6 F (36.4 C) (Oral)   Resp 16   Ht 5\' 3"  (1.6 m)   Wt 61.2 kg   SpO2 99%   BMI 23.91 kg/m   Physical Exam Vitals and nursing note reviewed.  HENT:     Head:     Comments: Mild hematoma and abrasion on right anterior zygomatic arch.  No underlying bony tenderness. Eyes:     Extraocular Movements: Extraocular movements intact.     Pupils: Pupils are equal, round, and reactive to light.  Cardiovascular:     Rate and Rhythm: Regular rhythm.  Pulmonary:     Breath sounds: Normal breath sounds.  Abdominal:     Tenderness: There is abdominal tenderness.  Musculoskeletal:        General: No tenderness.     Cervical back: Neck supple.  Skin:    General: Skin is warm.     Capillary Refill: Capillary refill takes less than 2 seconds.  Neurological:     Mental Status: He is alert. Mental status is at baseline.     ED Results / Procedures / Treatments   Labs (all labs ordered are listed, but only abnormal results are displayed) Labs Reviewed - No data to  display  EKG EKG Interpretation  Date/Time:  Friday December 23 2019 07:49:50 EDT Ventricular Rate:  59 PR Interval:  212 QRS Duration: 104 QT Interval:  458 QTC Calculation: 453 R Axis:   -92 Text Interpretation: Sinus bradycardia with 1st degree A-V block Right superior axis deviation Anterior infarct , age undetermined Abnormal ECG Confirmed by Davonna Belling [phone]) on 12/23/2019 8:51:10 AM   Radiology CT HEAD WO CONTRAST  Result Date: 12/23/2019 CLINICAL DATA:  Focal neurologic deficit greater than 6 hours. Stroke suspected. Status post fall. EXAM: CT HEAD WITHOUT CONTRAST TECHNIQUE: Contiguous axial images were obtained from the base of the skull through the vertex without intravenous contrast. COMPARISON:  12/11/2018 FINDINGS: Brain: No evidence of acute infarction, hemorrhage, hydrocephalus, extra-axial collection or mass lesion/mass effect. There is mild diffuse low-attenuation within the subcortical and periventricular white matter compatible with chronic microvascular disease. Prominence of the sulci and ventricles identified compatible with brain atrophy. Vascular: No hyperdense vessel or unexpected calcification. Skull: Normal. Negative for fracture or focal lesion. Sinuses/Orbits: No acute finding. Other: None IMPRESSION: 1. No acute intracranial abnormalities. 2. Chronic small vessel ischemic change and brain atrophy. Electronically Signed   By: Kerby Moors M.D.   On: 12/23/2019 08:50    Procedures Procedures (including critical care time)  Medications Ordered in ED Medications  sodium chloride flush (NS) 0.9 % injection 3 mL (has no administration in time range)    ED Course  I have reviewed the triage vital signs and the nursing notes.  Pertinent labs & imaging results that were available during my care of the patient were reviewed by me and considered in my medical decision making (see chart for details).    MDM Rules/Calculators/A&P                       Patient with fall.  Discussed with patient's daughter.  Reportedly was mechanical although she did not see  it.  She states people there states he just tripped and fell.  Head CT reassuring.  Mild swelling in face but doubt underlying fracture.  Does not appear to need work-up otherwise.  Not on anticoagulation.  Patient is back at his baseline and will discharge home. Final Clinical Impression(s) / ED Diagnoses Final diagnoses:  Fall, initial encounter  Concussion without loss of consciousness, initial encounter    Rx / DC Orders ED Discharge Orders    None       Davonna Belling, MD 12/23/19 [phone]

## 2019-12-26 ENCOUNTER — Ambulatory Visit (INDEPENDENT_AMBULATORY_CARE_PROVIDER_SITE_OTHER): Payer: Medicare Other | Admitting: Podiatry

## 2019-12-26 ENCOUNTER — Encounter: Payer: Self-pay | Admitting: Podiatry

## 2019-12-26 ENCOUNTER — Other Ambulatory Visit: Payer: Self-pay

## 2019-12-26 VITALS — Temp 98.6°F

## 2019-12-26 DIAGNOSIS — B351 Tinea unguium: Secondary | ICD-10-CM | POA: Diagnosis not present

## 2019-12-26 DIAGNOSIS — M79675 Pain in left toe(s): Secondary | ICD-10-CM

## 2019-12-26 DIAGNOSIS — M79674 Pain in right toe(s): Secondary | ICD-10-CM

## 2019-12-26 DIAGNOSIS — Q828 Other specified congenital malformations of skin: Secondary | ICD-10-CM | POA: Diagnosis not present

## 2019-12-26 DIAGNOSIS — M79672 Pain in left foot: Secondary | ICD-10-CM | POA: Diagnosis not present

## 2019-12-26 NOTE — Patient Instructions (Signed)

## 2019-12-28 ENCOUNTER — Other Ambulatory Visit: Payer: Self-pay | Admitting: Family Medicine

## 2019-12-28 DIAGNOSIS — F321 Major depressive disorder, single episode, moderate: Secondary | ICD-10-CM

## 2019-12-29 ENCOUNTER — Telehealth: Payer: Self-pay | Admitting: *Deleted

## 2019-12-29 ENCOUNTER — Encounter: Payer: Self-pay | Admitting: Family Medicine

## 2019-12-29 NOTE — Telephone Encounter (Signed)
Spoke with patient and gave him info for Synthroid Direct which would save him $200 per year. He really wasn't interested in switching and said he would just keep it as is-really is not going to break the bank.

## 2019-12-30 NOTE — Progress Notes (Signed)
Subjective: Danny Frederick presents today for follow up of painful porokeratotic lesion(s) left foot and painful mycotic toenails b/l that limit ambulation. Aggravating factors include weightbearing with and without shoe gear. Pain for both is relieved with periodic professional debridement.   He voices no new pedal concerns on today's visit.   No Known Allergies   Objective: Vitals:   12/26/19 1418  Temp: 98.6 F (31 C)    Pt is a pleasant  86 y.o. year old male, WD, WN  in NAD. AAO x 3.   Vascular Examination:  Capillary refill time to digits immediate b/l. Palpable DP pulses b/l. Palpable PT pulses b/l. Pedal hair sparse b/l. Skin temperature gradient within normal limits b/l. No edema noted b/l.  Dermatological Examination: Pedal skin with normal turgor, texture and tone bilaterally. No open wounds bilaterally. No interdigital macerations bilaterally. Toenails 1-5 b/l elongated, dystrophic, thickened, crumbly with subungual debris and tenderness to dorsal palpation. Porokeratotic lesion(s) submet head 4 left foot. No erythema, no edema, no drainage, no flocculence.  Musculoskeletal: Normal muscle strength 5/5 to all lower extremity muscle groups bilaterally. No gross bony deformities bilaterally. No pain crepitus or joint limitation noted with ROM b/l. Patient ambulates independent of any assistive aids.  Neurological: Protective sensation intact 5/5 intact bilaterally with 10g monofilament b/l. Vibratory sensation intact b/l. Proprioception intact bilaterally.  Assessment: 1. Pain due to onychomycosis of toenails of both feet   2. Porokeratosis   3. Left foot pain    Plan: -Medicare ABN signed for this year. Copy given to patient on today's visit and copy placed in patient's chart. -Toenails 1-5 b/l were debrided in length and girth with sterile nail nippers and dremel without iatrogenic bleeding.  -Painful porokeratotic lesion(s) submet head 5 left foot pared and enucleated  with sterile scalpel blade without incident. -Patient to continue soft, supportive shoe gear daily. -Patient to report any pedal injuries to medical professional immediately. -Patient/POA to call should there be question/concern in the interim.  Return in about 3 months (around 03/26/2020).

## 2020-01-02 ENCOUNTER — Encounter: Payer: Self-pay | Admitting: *Deleted

## 2020-01-06 DIAGNOSIS — H25811 Combined forms of age-related cataract, right eye: Secondary | ICD-10-CM | POA: Diagnosis not present

## 2020-01-06 DIAGNOSIS — H2511 Age-related nuclear cataract, right eye: Secondary | ICD-10-CM | POA: Diagnosis not present

## 2020-01-31 ENCOUNTER — Other Ambulatory Visit: Payer: Medicare Other

## 2020-02-01 ENCOUNTER — Encounter: Payer: Medicare Other | Admitting: Family Medicine

## 2020-02-03 ENCOUNTER — Telehealth: Payer: Self-pay

## 2020-02-03 DIAGNOSIS — H25812 Combined forms of age-related cataract, left eye: Secondary | ICD-10-CM | POA: Diagnosis not present

## 2020-02-03 DIAGNOSIS — H2512 Age-related nuclear cataract, left eye: Secondary | ICD-10-CM | POA: Diagnosis not present

## 2020-02-03 NOTE — Telephone Encounter (Signed)
Pt. Daughter called stating that she needed to get him scheduled for an apt and labs. I got him scheduled for a med check on 03/12/20 and his labs on 03/09/20 if you could put the order for lab work.

## 2020-02-03 NOTE — Telephone Encounter (Signed)
He already has future orders (ordered in December, expected date June). Looks like his cardiologist checked his lipids a few months ago, so he does NOT need to have lipids repeated again (he does need the c-met repeated again). Please cancel the future order for lipid panel. Thanks

## 2020-02-29 ENCOUNTER — Encounter: Payer: Self-pay | Admitting: Family Medicine

## 2020-02-29 ENCOUNTER — Other Ambulatory Visit: Payer: Self-pay | Admitting: Family Medicine

## 2020-02-29 DIAGNOSIS — E039 Hypothyroidism, unspecified: Secondary | ICD-10-CM

## 2020-02-29 NOTE — Telephone Encounter (Signed)
Left message asking patient to please call back and let me know if he needs this filled prior to 7/12 appt.

## 2020-03-08 ENCOUNTER — Other Ambulatory Visit: Payer: Self-pay | Admitting: Family Medicine

## 2020-03-09 ENCOUNTER — Other Ambulatory Visit: Payer: Self-pay

## 2020-03-09 ENCOUNTER — Other Ambulatory Visit: Payer: Medicare Other

## 2020-03-09 DIAGNOSIS — E559 Vitamin D deficiency, unspecified: Secondary | ICD-10-CM

## 2020-03-09 DIAGNOSIS — E039 Hypothyroidism, unspecified: Secondary | ICD-10-CM

## 2020-03-09 DIAGNOSIS — R7301 Impaired fasting glucose: Secondary | ICD-10-CM

## 2020-03-09 DIAGNOSIS — Z5181 Encounter for therapeutic drug level monitoring: Secondary | ICD-10-CM

## 2020-03-09 DIAGNOSIS — I10 Essential (primary) hypertension: Secondary | ICD-10-CM

## 2020-03-09 DIAGNOSIS — E7800 Pure hypercholesterolemia, unspecified: Secondary | ICD-10-CM

## 2020-03-10 LAB — COMPREHENSIVE METABOLIC PANEL
ALT: 12 IU/L (ref 0–44)
AST: 20 IU/L (ref 0–40)
Albumin/Globulin Ratio: 1 — ABNORMAL LOW (ref 1.2–2.2)
Albumin: 4 g/dL (ref 3.6–4.6)
Alkaline Phosphatase: 79 IU/L (ref 48–121)
BUN/Creatinine Ratio: 18 (ref 10–24)
BUN: 21 mg/dL (ref 8–27)
Bilirubin Total: 0.6 mg/dL (ref 0.0–1.2)
CO2: 25 mmol/L (ref 20–29)
Calcium: 9.9 mg/dL (ref 8.6–10.2)
Chloride: 99 mmol/L (ref 96–106)
Creatinine, Ser: 1.2 mg/dL (ref 0.76–1.27)
GFR calc Af Amer: 64 mL/min/{1.73_m2} (ref >59)
GFR calc non Af Amer: 55 mL/min/{1.73_m2} — ABNORMAL LOW (ref >59)
Globulin, Total: 4.1 g/dL (ref 1.5–4.5)
Glucose: 92 mg/dL (ref 65–99)
Potassium: 4.9 mmol/L (ref 3.5–5.2)
Sodium: 137 mmol/L (ref 134–144)
Total Protein: 8.1 g/dL (ref 6.0–8.5)

## 2020-03-10 LAB — CBC WITH DIFFERENTIAL/PLATELET
Basophils Absolute: 0.1 10*3/uL (ref 0.0–0.2)
Basos: 1 %
EOS (ABSOLUTE): 0.3 10*3/uL (ref 0.0–0.4)
Eos: 4 %
Hematocrit: 41.5 % (ref 37.5–51.0)
Hemoglobin: 13.4 g/dL (ref 13.0–17.7)
Immature Grans (Abs): 0 10*3/uL (ref 0.0–0.1)
Immature Granulocytes: 0 %
Lymphocytes Absolute: 2.6 10*3/uL (ref 0.7–3.1)
Lymphs: 31 %
MCH: 27.7 pg (ref 26.6–33.0)
MCHC: 32.3 g/dL (ref 31.5–35.7)
MCV: 86 fL (ref 79–97)
Monocytes Absolute: 0.9 10*3/uL (ref 0.1–0.9)
Monocytes: 11 %
Neutrophils Absolute: 4.5 10*3/uL (ref 1.4–7.0)
Neutrophils: 53 %
Platelets: 339 10*3/uL (ref 150–450)
RBC: 4.83 x10E6/uL (ref 4.14–5.80)
RDW: 12.7 % (ref 11.6–15.4)
WBC: 8.4 10*3/uL (ref 3.4–10.8)

## 2020-03-10 LAB — LIPID PANEL
Chol/HDL Ratio: 2.1 ratio (ref 0.0–5.0)
Cholesterol, Total: 144 mg/dL (ref 100–199)
HDL: 68 mg/dL (ref >39)
LDL Chol Calc (NIH): 61 mg/dL (ref 0–99)
Triglycerides: 79 mg/dL (ref 0–149)
VLDL Cholesterol Cal: 15 mg/dL (ref 5–40)

## 2020-03-10 LAB — TSH: TSH: 3.63 u[IU]/mL (ref 0.450–4.500)

## 2020-03-10 LAB — HEMOGLOBIN A1C
Est. average glucose Bld gHb Est-mCnc: 117 mg/dL
Hgb A1c MFr Bld: 5.7 % — ABNORMAL HIGH (ref 4.8–5.6)

## 2020-03-10 LAB — VITAMIN D 25 HYDROXY (VIT D DEFICIENCY, FRACTURES): Vit D, 25-Hydroxy: 32.1 ng/mL (ref 30.0–100.0)

## 2020-03-11 NOTE — Progress Notes (Signed)
Chief Complaint  Patient presents with  . other    med check    Patient presents for 6 month med check on chronic problems (should have been scheduled for Medicare wellness visit, but was not).  He had labs done prior to his visit, see below.  Hypertension: This has been managed by Dr. Stanford Breed, last saw him in February. No medication changes were made at that time.  BP has been well controlled without dizziness, on current regimen of amlodipine 5mg  BID, Avapro 300mg  daily and lopressor 25mg  BID.  BP's at home have been running 135/80 that he can recall (didn't bring list). He denies dizziness, headaches or side effects. He continues to have balance issues since the stroke.   Depression: He was started on citalopram in December 2019, at which time his PHQ-9 scorewas12. He continues to do much better now as far as moods. He continues on citalopram and denies side effects.  Hyperlipidemia--toleratingatorvastatinwithout side effects.He follows a lowfat, low cholesterol diet.He has been at goal on this dose.  Hypothyroidism: Denies any symptoms--no changes in weight, moods, hair/skin. Bowels are normal.Somemilddecrease in energy/fatigue over the last few years that herelatesto age. Prior to his stroke (and the COVID pandemic), his exercise has been decreasing--he was walking 30 minutes (rather than an hour), and had cut back on going to the gym from 3 days to 2 days/week. Current exercise--walking 20 minutes 3-4 times/week, no longer goes to the gym.  Uses a walker due to his balance.  Does some weight-bearing exercise at home (2# weighs).  He had asked about generic vs branded (free vs $500/yr).  Veronica told him about Synthroid [facility], which would save him money, but he elected to continue with his same pharmacy.  He is due for refill. He is compliant with taking his thyroidmedication on an empty stomach; though he takes it along with other medications, as always, but now including  his vitamins (used to keep those separate).  GERD:He last reported that watering down the wine has helped his reflux. He has been doing well taking 20mg  qod (bumps up to 40mg  daily only if has a significant flare, none recently).  He hasn't needed to do this in a while, doing well.  Impaired fasting glucose--He has a history of mildly elevated sugars in the past, with A1c of 5.7.Doesn't drink soda, doesn't add sugar to his tea.Tries to limit his sweets.  Low Vitamin D level, noted to be 26 in February, 2018. Last level was normal in April 2019 (40.9), when taking 2000 IU daily. At his last visit 6 months ago he had reported running out of supplement, hadn't taken any in 2-3 months.  He has been back to taking 2000 IU daily.  Pt is s/pleft basal gangliaintracranial hemorrhage, felt to berelated to hypertensive crisis, in 12/2018. He has done well since then.  He last saw neuro in June of 2020.  He was due to f/u in 05/2019, but cancelled due to not feeling well.  He never rescheduled this.  His balance still isn't back to normal, uses a walker or cane when out. He still doesn't feel like he has his full strength back.  He exercises daily at home. He did have a fall in April--he tripped and fell when walking in for his cataract surgery (ER visit). He is somewhat worried that his balance problems could be related to Parkinson's, as his father had Parkinson's. He thinks his balance started getting worse PRIOR (shortly) to his stroke, and that his  gait has been more shuffling. He denies any memory concerns, denies any tremor.   PMH, PSH, SH reviewed  Outpatient Encounter Medications as of 03/12/2020  Medication Sig Note  . amLODipine (NORVASC) 5 MG tablet Take 1 tablet (5 mg total) by mouth 2 (two) times daily.   Marland Kitchen atorvastatin (LIPITOR) 10 MG tablet TAKE 1 TABLET BY MOUTH  DAILY   . Cholecalciferol (VITAMIN D3) 2000 units TABS Take 2,000 Units by mouth daily.    . citalopram (CELEXA) 20 MG  tablet TAKE 1 TABLET BY MOUTH  DAILY   . Ferrous Sulfate (IRON) 325 (65 Fe) MG TABS Take 1 tablet (325 mg total) by mouth every other day. Reported on 11/08/2015   . irbesartan (AVAPRO) 300 MG tablet TAKE 1 TABLET BY MOUTH  DAILY   . metoprolol tartrate (LOPRESSOR) 25 MG tablet TAKE 1 TABLET BY MOUTH TWO  TIMES DAILY   . mupirocin ointment (BACTROBAN) 2 % mupirocin 2 % topical ointment   . omeprazole (PRILOSEC) 20 MG capsule TAKE 1 CAPSULE BY MOUTH  EVERY OTHER DAY   . OVER THE COUNTER MEDICATION Place 1 application into both eyes daily as needed (dry eyes/irritation). OTC eye gel 08/04/2019: occasionally  . SYNTHROID 50 MCG tablet TAKE 1 TABLET BY MOUTH  DAILY BEFORE BREAKFAST   . triamcinolone cream (KENALOG) 0.1 % Apply 1 application topically 2 (two) times daily as needed.   Marland Kitchen ipratropium (ATROVENT) 0.03 % nasal spray Place 2 sprays into both nostrils 2 (two) times daily as needed for rhinitis.  (Patient not taking: Reported on 03/12/2020)    No facility-administered encounter medications on file as of 03/12/2020.   No Known Allergies  ROS: no fever, chills, headaches, dizziness, chest pain, palpitations, shortness of breath. Generalized mild fatigue per HPI. No URI symptoms, cough.  No edema. No GI complaints. Reflux is well controlled No bleeding, bruising, rash. Depression is controlled See HPI   PHYSICAL EXAM:  BP 124/76   Pulse 62   Temp 97.7 F (36.5 C)   Wt 139 lb 12.8 oz (63.4 kg)   SpO2 96%   BMI 24.76 kg/m   Wt Readings from Last 3 Encounters:  03/12/20 139 lb 12.8 oz (63.4 kg)  12/23/19 135 lb (61.2 kg)  10/25/19 141 lb (64 kg)   Well appearing, elderly male in no distress, in good spirits HEENT: conjunctiva and sclera are clear, EOMI, fundi benign. Wearing mask due to COVID-19 pandemic. Neck: no lymphadenopathy, thyromegaly or carotid bruit Heart: regular rate and rhythm, no murmur Lungs: clear bilaterally Back: no spinal or CVA tenderness Abdomen: soft,  nontender, no mass Extremities: no edema Psych: normal mood, affect, hygiene and grooming Neuro: alert and oriented.  Cranial nerves not examined (wearing mask, EOMI).   Very slight tremor, R>L (noticed when pointing index fingers at each other) No resting tremor. No cogwheeling. Gait is slow, cautious, and somewhat wide-based.  PHQ-9 score of 3 (daily fatigue)--unchanged from last exam in 08/2019.   Lab Results  Component Value Date   TSH 3.630 03/09/2020   Lab Results  Component Value Date   WBC 8.4 03/09/2020   HGB 13.4 03/09/2020   HCT 41.5 03/09/2020   MCV 86 03/09/2020   PLT 339 03/09/2020     Chemistry      Component Value Date/Time   NA 137 03/09/2020 0845   K 4.9 03/09/2020 0845   CL 99 03/09/2020 0845   CO2 25 03/09/2020 0845   BUN 21 03/09/2020 0845   CREATININE  1.20 03/09/2020 0845   CREATININE 1.26 (H) 10/22/2016 1437      Component Value Date/Time   CALCIUM 9.9 03/09/2020 0845   ALKPHOS 79 03/09/2020 0845   AST 20 03/09/2020 0845   ALT 12 03/09/2020 0845   BILITOT 0.6 03/09/2020 0845     Fasting glu 92  Lab Results  Component Value Date   HGBA1C 5.7 (H) 03/09/2020   Vitamin D-OH 32.1  Lab Results  Component Value Date   CHOL 144 03/09/2020   HDL 68 03/09/2020   LDLCALC 61 03/09/2020   TRIG 79 03/09/2020   CHOLHDL 2.1 03/09/2020     ASSESSMENT/PLAN:  Hypothyroidism, unspecified type - adequately replaced; reminded to take vitamins separately. - Plan: SYNTHROID 50 MCG tablet  Impaired fasting glucose - reviewed proper diet, exercise  Essential hypertension - well controlled on current regimen  Pure hypercholesterolemia - at goal on current regimen - Plan: atorvastatin (LIPITOR) 10 MG tablet  Vitamin D deficiency - adequately replaced, continue current supplements  Depression, major, in remission ([place]) - Continue citalopram - Plan: citalopram (CELEXA) 20 MG tablet  Atherosclerosis of aorta (HCC) - cont ASA, statin - Plan:  atorvastatin (LIPITOR) 10 MG tablet  Gastroesophageal reflux disease, unspecified whether esophagitis present - controlled with diet and qod PPI - Plan: omeprazole (PRILOSEC) 20 MG capsule  Balance problem - persistent since CVA. Encouraged to restart home exercise program from PT, and to schedule f/u with neuro   Today's visit was supposed to be scheduled for AWV/med check+, NOT a med check. Past due for AWV. Will have him return for this in 6 months. Pt advised of need for this particular type of visit, and not just med check.

## 2020-03-12 ENCOUNTER — Encounter: Payer: Self-pay | Admitting: Family Medicine

## 2020-03-12 ENCOUNTER — Ambulatory Visit (INDEPENDENT_AMBULATORY_CARE_PROVIDER_SITE_OTHER): Payer: Medicare Other | Admitting: Family Medicine

## 2020-03-12 VITALS — BP 124/76 | HR 62 | Temp 97.7°F | Wt 139.8 lb

## 2020-03-12 DIAGNOSIS — F325 Major depressive disorder, single episode, in full remission: Secondary | ICD-10-CM

## 2020-03-12 DIAGNOSIS — R2689 Other abnormalities of gait and mobility: Secondary | ICD-10-CM

## 2020-03-12 DIAGNOSIS — E559 Vitamin D deficiency, unspecified: Secondary | ICD-10-CM

## 2020-03-12 DIAGNOSIS — E7800 Pure hypercholesterolemia, unspecified: Secondary | ICD-10-CM | POA: Diagnosis not present

## 2020-03-12 DIAGNOSIS — I700 Atherosclerosis of aorta: Secondary | ICD-10-CM

## 2020-03-12 DIAGNOSIS — R7301 Impaired fasting glucose: Secondary | ICD-10-CM | POA: Diagnosis not present

## 2020-03-12 DIAGNOSIS — I10 Essential (primary) hypertension: Secondary | ICD-10-CM

## 2020-03-12 DIAGNOSIS — E039 Hypothyroidism, unspecified: Secondary | ICD-10-CM | POA: Diagnosis not present

## 2020-03-12 DIAGNOSIS — K219 Gastro-esophageal reflux disease without esophagitis: Secondary | ICD-10-CM

## 2020-03-12 MED ORDER — OMEPRAZOLE 20 MG PO CPDR: DELAYED_RELEASE_CAPSULE | ORAL | 3 refills | Status: AC

## 2020-03-12 MED ORDER — CITALOPRAM HYDROBROMIDE 20 MG PO TABS: 20.0000 mg | ORAL_TABLET | Freq: Every day | ORAL | 3 refills | Status: AC

## 2020-03-12 MED ORDER — ATORVASTATIN CALCIUM 10 MG PO TABS: 10.0000 mg | ORAL_TABLET | Freq: Every day | ORAL | 3 refills | Status: DC

## 2020-03-12 MED ORDER — SYNTHROID 50 MCG PO TABS: ORAL_TABLET | ORAL | 3 refills | Status: DC

## 2020-03-12 NOTE — Patient Instructions (Addendum)
Try and take your synthroid separate from any vitamins (take those either with lunch or in the evening, not in the morning).  Please contact Neurology and schedule a follow-up visit.  You were last seen in 01/2019, cancelled your visit for September, and didn't reschedule. Given your ongoing issues with balance (and concerns about your shuffling and possible Parkinson's), I recommend you see them. Please call [facility] to schedule this.  I recommend that you try and do the home exercises that you were given for balance last year (if you still have them).  If you would like more physical therapy to help with balance, let us know, but let's start with resuming the prior exercise regimen.

## 2020-03-15 ENCOUNTER — Encounter: Payer: Self-pay | Admitting: Family Medicine

## 2020-03-24 ENCOUNTER — Other Ambulatory Visit: Payer: Self-pay | Admitting: Cardiology

## 2020-03-24 DIAGNOSIS — I10 Essential (primary) hypertension: Secondary | ICD-10-CM

## 2020-03-26 ENCOUNTER — Encounter: Payer: Self-pay | Admitting: Podiatry

## 2020-03-26 ENCOUNTER — Ambulatory Visit (INDEPENDENT_AMBULATORY_CARE_PROVIDER_SITE_OTHER): Payer: Medicare Other | Admitting: Podiatry

## 2020-03-26 ENCOUNTER — Other Ambulatory Visit: Payer: Self-pay

## 2020-03-26 DIAGNOSIS — B351 Tinea unguium: Secondary | ICD-10-CM

## 2020-03-26 DIAGNOSIS — Q828 Other specified congenital malformations of skin: Secondary | ICD-10-CM | POA: Diagnosis not present

## 2020-03-26 DIAGNOSIS — M79674 Pain in right toe(s): Secondary | ICD-10-CM

## 2020-03-26 DIAGNOSIS — M79672 Pain in left foot: Secondary | ICD-10-CM | POA: Diagnosis not present

## 2020-03-26 DIAGNOSIS — M79675 Pain in left toe(s): Secondary | ICD-10-CM | POA: Diagnosis not present

## 2020-03-27 NOTE — Progress Notes (Signed)
Subjective:  Patient ID: Danny Frederick, male    DOB: 1934/03/14,  MRN: [id number]  Danny Frederick presents to clinic today for painful porokeratotic lesion(s) plantar left foot and painful mycotic toenails that limit ambulation. Aggravating factors include weightbearing with and without shoe gear. Pain for both is relieved with periodic professional debridement..  86 y.o. male presents with the above complaint.  Reports no other concerns on today's visit.  Review of Systems: Negative except as noted in the HPI. Past Medical History:  Diagnosis Date  . Allergic conjunctivitis   . Allergic rhinitis, cause unspecified    on allergy shots (Dr. Orvil Feil)  . Atherosclerosis of aorta (HCC)    noted on CT angio of abd/pelvis, in many vessels  . BPH (benign prostatic hypertrophy)   . Diverticulosis of colon 12/09  . Foot drop, right   . GERD (gastroesophageal reflux disease)   . Hearing loss in left ear    both ears now  . History of Clostridium difficile    multiple times 2016--s/p fecal transplant (Dr. Baxter Flattery).  Marland Kitchen Hx of echocardiogram    Echo (03/2014):  Mild LVH, EF 50-55%, no RWMA, Gr 1 DD, mild MR, mild reduced RVSF  . Hypertension   . Internal hemorrhoids   . Iron deficiency anemia, unspecified 6/09  . Mitral valve prolapse    h/o; normal echo 12/2011 with no MVP seen  . 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 Right ventricular dysfunction 01/23/2014   Secondary to obstruction of main pulmonary artery  . Ruptured thoracic aortic aneurysm ([place]) 01/23/2014  . S/P ascending aortic aneurysm repair 01/24/2014  . Unspecified hypothyroidism    Past Surgical History:  Procedure Laterality Date  . CERVICAL LAMINECTOMY  1972   C5-6  . COLONOSCOPY WITH PROPOFOL N/A 11/09/2014   Procedure: COLONOSCOPY WITH PROPOFOL;  Surgeon: Jerene Bears, MD;  Location: [facility];  Service: Gastroenterology;  Laterality: N/A;  . ESOPHAGOGASTRODUODENOSCOPY  10/31/08   normal; Dr. Edison Nasuti  . FECAL  TRANSPLANT N/A 11/09/2014   Procedure: FECAL TRANSPLANT;  Surgeon: Jerene Bears, MD;  Location: [facility];  Service: Gastroenterology;  Laterality: N/A;  . HEMORRHOIDECTOMY WITH HEMORRHOID BANDING  04/07/13  . hemorroidal banding  04/07/2013   x3-Dr.Eric Redmond Pulling  . PROSTATE SURGERY  2007   photovaporization  . ROTATOR CUFF REPAIR  10/2008   left; Dr. Onnie Graham  . SPINE SURGERY  2006   L4-5 disk surgery  . SPINE SURGERY  04/2013   L4-5, L5-S1 fusion.  Dr. Christella Noa  . THORACIC AORTIC ANEURYSM REPAIR N/A 01/23/2014   Procedure: THORACIC ASCENDING ANEURYSM REPAIR (AAA);  Surgeon: Rexene Alberts, MD;  Location: [place];  Service: Open Heart Surgery;  Laterality: N/A;  . TONSILLECTOMY      Current Outpatient Medications:  .  amLODipine (NORVASC) 5 MG tablet, TAKE 1 TABLET BY MOUTH  TWICE DAILY, Disp: 180 tablet, Rfl: 2 .  atorvastatin (LIPITOR) 10 MG tablet, Take 1 tablet (10 mg total) by mouth daily., Disp: 90 tablet, Rfl: 3 .  Cholecalciferol (VITAMIN D3) 2000 units TABS, Take 2,000 Units by mouth daily. , Disp: , Rfl:  .  citalopram (CELEXA) 20 MG tablet, Take 1 tablet (20 mg total) by mouth daily., Disp: 90 tablet, Rfl: 3 .  Ferrous Sulfate (IRON) 325 (65 Fe) MG TABS, Take 1 tablet (325 mg total) by mouth every other day. Reported on 11/08/2015, Disp: 30 each, Rfl: 0 .  ipratropium (ATROVENT) 0.03 % nasal spray, Place 2 sprays into both nostrils 2 (  two) times daily as needed for rhinitis.  (Patient not taking: Reported on 03/12/2020), Disp: , Rfl:  .  irbesartan (AVAPRO) 300 MG tablet, TAKE 1 TABLET BY MOUTH  DAILY, Disp: 90 tablet, Rfl: 3 .  metoprolol tartrate (LOPRESSOR) 25 MG tablet, TAKE 1 TABLET BY MOUTH  TWICE DAILY, Disp: 180 tablet, Rfl: 2 .  mupirocin ointment (BACTROBAN) 2 %, mupirocin 2 % topical ointment, Disp: , Rfl:  .  omeprazole (PRILOSEC) 20 MG capsule, TAKE 1 CAPSULE BY MOUTH  EVERY OTHER DAY, Disp: 45 capsule, Rfl: 3 .  OVER THE COUNTER MEDICATION, Place 1 application into both  eyes daily as needed (dry eyes/irritation). OTC eye gel, Disp: , Rfl:  .  SYNTHROID 50 MCG tablet, TAKE 1 TABLET BY MOUTH  DAILY BEFORE BREAKFAST, Disp: 90 tablet, Rfl: 3 .  triamcinolone cream (KENALOG) 0.1 %, Apply 1 application topically 2 (two) times daily as needed., Disp: 454 g, Rfl: 0 No Known Allergies Social History   Occupational History  . Not on file  Tobacco Use  . Smoking status: Former Smoker    Types: Cigarettes    Quit date: 09/01/1977    Years since quitting: 42.5  . Smokeless tobacco: Never Used  Vaping Use  . Vaping Use: Never used  Substance and Sexual Activity  . Alcohol use: Yes    Comment: 2 glasses wine per day (small)  . Drug use: No  . Sexual activity: Not on file    Objective:   Constitutional Danny Frederick is a pleasant 86 y.o. Caucasian male, WD, WN in NAD.Marland Kitchen AAO x 3.   Vascular Dorsalis pedis pulses palpable bilaterally. Posterior tibial pulses palpable bilaterally. Capillary refill normal to all digits.  No cyanosis or clubbing noted. Pedal hair growth sparse b/l. No pain with calf compression b/l. No edema noted b/l lower extremities.  Neurologic Normal speech. Oriented to person, place, and time.. Protective sensation intact 5/5 intact bilaterally with 10g monofilament b/l. Vibratory sensation intact b/l. Proprioception intact bilaterally.  Dermatologic Pedal skin with normal turgor, texture and tone b/l.  Toenails are discolored, thick, elongated, dystrophic with pain on palpation x 10 No open wounds. No skin lesions. Porokeratotic lesion submet head 4 left foot with tenderness to palpation. No erythema, no edema, no drainage, no flocculence.  Orthopedic: Normal muscle strength 5/5 to all lower extremity muscle groups bilaterally. No pain crepitus or joint limitation noted with ROM b/l. No gross bony deformities bilaterally. Patient ambulates independent of any assistive aids. No bony tenderness.    Radiographs: None Assessment:   1. Pain  due to onychomycosis of toenails of both feet   2. Porokeratosis   3. Left foot pain    Plan:  Patient was evaluated and treated and all questions answered.  Onychomycosis with pain -Nails palliatively debridement as below -Educated on self-care  Procedure: Nail Debridement Rationale: Pain Type of Debridement: manual, sharp debridement. Instrumentation: Nail nipper, rotary burr. Number of Nails: 10 -Examined patient. -No new findings. No new orders. -Toenails 1-5 b/l were debrided in length and girth with sterile nail nippers and dremel without iatrogenic bleeding.  -Painful porokeratotic lesion(s) submet head 4 left foot pared and enucleated with sterile scalpel blade without incident. -Patient to report any pedal injuries to medical professional immediately. -Patient to continue soft, supportive shoe gear daily. -Patient/POA to call should there be question/concern in the interim.  Return in about 3 months (around 06/26/2020) for nail and callus trim.  Marzetta Board, DPM

## 2020-05-03 ENCOUNTER — Telehealth: Payer: Self-pay

## 2020-05-03 NOTE — Telephone Encounter (Signed)
Pt. Daughter called stating that he needs a referral to [facility] for a hearing test and one of his hearing aids has a crack in it.

## 2020-05-03 NOTE — Telephone Encounter (Signed)
Faxed to [phone]

## 2020-05-21 ENCOUNTER — Telehealth: Payer: Self-pay | Admitting: *Deleted

## 2020-05-21 ENCOUNTER — Encounter: Payer: Self-pay | Admitting: Family Medicine

## 2020-05-21 MED ORDER — TRIAMCINOLONE ACETONIDE 0.1 % EX CREA: 1.0000 "application " | TOPICAL_CREAM | Freq: Two times a day (BID) | CUTANEOUS | 0 refills | Status: DC | PRN

## 2020-05-21 NOTE — Telephone Encounter (Signed)
Patient needs refill on triamcinolone sent to [facility] [address].

## 2020-05-22 DIAGNOSIS — H903 Sensorineural hearing loss, bilateral: Secondary | ICD-10-CM | POA: Diagnosis not present

## 2020-05-23 ENCOUNTER — Encounter: Payer: Self-pay | Admitting: Family Medicine

## 2020-05-30 ENCOUNTER — Encounter: Payer: Self-pay | Admitting: Family Medicine

## 2020-06-19 ENCOUNTER — Other Ambulatory Visit (INDEPENDENT_AMBULATORY_CARE_PROVIDER_SITE_OTHER): Payer: Medicare Other

## 2020-06-19 ENCOUNTER — Other Ambulatory Visit: Payer: Self-pay

## 2020-06-19 DIAGNOSIS — Z23 Encounter for immunization: Secondary | ICD-10-CM | POA: Diagnosis not present

## 2020-06-26 ENCOUNTER — Other Ambulatory Visit: Payer: Self-pay

## 2020-06-26 ENCOUNTER — Encounter: Payer: Self-pay | Admitting: Podiatry

## 2020-06-26 ENCOUNTER — Ambulatory Visit (INDEPENDENT_AMBULATORY_CARE_PROVIDER_SITE_OTHER): Payer: Medicare Other | Admitting: Podiatry

## 2020-06-26 DIAGNOSIS — B351 Tinea unguium: Secondary | ICD-10-CM | POA: Diagnosis not present

## 2020-06-26 DIAGNOSIS — Q828 Other specified congenital malformations of skin: Secondary | ICD-10-CM | POA: Diagnosis not present

## 2020-06-26 DIAGNOSIS — M79674 Pain in right toe(s): Secondary | ICD-10-CM

## 2020-06-26 DIAGNOSIS — M79675 Pain in left toe(s): Secondary | ICD-10-CM | POA: Diagnosis not present

## 2020-06-26 DIAGNOSIS — M79672 Pain in left foot: Secondary | ICD-10-CM | POA: Diagnosis not present

## 2020-06-30 NOTE — Progress Notes (Signed)
Subjective:  Patient ID: Danny Frederick, male    DOB: [birth year],  MRN: [id number]  Danny Frederick presents to clinic today for painful porokeratotic lesion(s) plantar left foot and painful mycotic toenails that limit ambulation. Aggravating factors include weightbearing with and without shoe gear. Pain for both is relieved with periodic professional debridement.  86 y.o. male presents with the above complaint.  Reports no other concerns on today's visit.  Review of Systems: Negative except as noted in the HPI. Past Medical History:  Diagnosis Date  . Allergic conjunctivitis   . Allergic rhinitis, cause unspecified    on allergy shots (Dr. Orvil Feil)  . Atherosclerosis of aorta (HCC)    noted on CT angio of abd/pelvis, in many vessels  . BPH (benign prostatic hypertrophy)   . Diverticulosis of colon 12/09  . Foot drop, right   . GERD (gastroesophageal reflux disease)   . Hearing loss in left ear    both ears now  . History of Clostridium difficile    multiple times 2016--s/p fecal transplant (Dr. Baxter Flattery).  Marland Kitchen Hx of echocardiogram    Echo (03/2014):  Mild LVH, EF 50-55%, no RWMA, Gr 1 DD, mild MR, mild reduced RVSF  . Hypertension   . Internal hemorrhoids   . Iron deficiency anemia, unspecified 6/09  . Mitral valve prolapse    h/o; normal echo 12/2011 with no MVP seen  . 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 Right ventricular dysfunction 01/23/2014   Secondary to obstruction of main pulmonary artery  . Ruptured thoracic aortic aneurysm ([place]) 01/23/2014  . S/P ascending aortic aneurysm repair 01/24/2014  . Unspecified hypothyroidism    Past Surgical History:  Procedure Laterality Date  . CERVICAL LAMINECTOMY  1972   C5-6  . COLONOSCOPY WITH PROPOFOL N/A 11/09/2014   Procedure: COLONOSCOPY WITH PROPOFOL;  Surgeon: Jerene Bears, MD;  Location: [facility];  Service: Gastroenterology;  Laterality: N/A;  . ESOPHAGOGASTRODUODENOSCOPY  10/31/08   normal; Dr. Edison Nasuti  . FECAL  TRANSPLANT N/A 11/09/2014   Procedure: FECAL TRANSPLANT;  Surgeon: Jerene Bears, MD;  [facility]: [facility];  Service: Gastroenterology;  Laterality: N/A;  . HEMORRHOIDECTOMY WITH HEMORRHOID BANDING  04/07/13  . hemorroidal banding  04/07/2013   x3-Dr.Eric Redmond Pulling  . PROSTATE SURGERY  2007   photovaporization  . ROTATOR CUFF REPAIR  10/2008   left; Dr. Onnie Graham  . SPINE SURGERY  2006   L4-5 disk surgery  . SPINE SURGERY  04/2013   L4-5, L5-S1 fusion.  Dr. Christella Noa  . THORACIC AORTIC ANEURYSM REPAIR N/A 01/23/2014   Procedure: THORACIC ASCENDING ANEURYSM REPAIR ([facility]);  Surgeon: Rexene Alberts, MD;  Location: [place];  Service: Open Heart Surgery;  Laterality: N/A;  . TONSILLECTOMY      Current Outpatient Medications:  .  amLODipine (NORVASC) 5 MG tablet, TAKE 1 TABLET BY MOUTH  TWICE DAILY, Disp: 180 tablet, Rfl: 2 .  atorvastatin (LIPITOR) 10 MG tablet, Take 1 tablet (10 mg total) by mouth daily., Disp: 90 tablet, Rfl: 3 .  Cholecalciferol (VITAMIN D3) 2000 units TABS, Take 2,000 Units by mouth daily. , Disp: , Rfl:  .  citalopram (CELEXA) 20 MG tablet, Take 1 tablet (20 mg total) by mouth daily., Disp: 90 tablet, Rfl: 3 .  Ferrous Sulfate (IRON) 325 (65 Fe) MG TABS, Take 1 tablet (325 mg total) by mouth every other day. Reported on 11/08/2015, Disp: 30 each, Rfl: 0 .  ipratropium (ATROVENT) 0.03 % nasal spray, Place 2 sprays into both nostrils 2 (  two) times daily as needed for rhinitis. , Disp: , Rfl:  .  irbesartan (AVAPRO) 300 MG tablet, TAKE 1 TABLET BY MOUTH  DAILY, Disp: 90 tablet, Rfl: 3 .  metoprolol tartrate (LOPRESSOR) 25 MG tablet, TAKE 1 TABLET BY MOUTH  TWICE DAILY, Disp: 180 tablet, Rfl: 2 .  mupirocin ointment (BACTROBAN) 2 %, mupirocin 2 % topical ointment, Disp: , Rfl:  .  omeprazole (PRILOSEC) 20 MG capsule, TAKE 1 CAPSULE BY MOUTH  EVERY OTHER DAY, Disp: 45 capsule, Rfl: 3 .  OVER THE COUNTER MEDICATION, Place 1 application into both eyes daily as needed (dry eyes/irritation). OTC  eye gel, Disp: , Rfl:  .  SYNTHROID 50 MCG tablet, TAKE 1 TABLET BY MOUTH  DAILY BEFORE BREAKFAST, Disp: 90 tablet, Rfl: 3 .  triamcinolone cream (KENALOG) 0.1 %, Apply 1 application topically 2 (two) times daily as needed., Disp: 454 g, Rfl: 0 No Known Allergies Social History   Occupational History  . Not on file  Tobacco Use  . Smoking status: Former Smoker    Types: Cigarettes    Quit date: 09/01/1977    Years since quitting: 42.8  . Smokeless tobacco: Never Used  Vaping Use  . Vaping Use: Never used  Substance and Sexual Activity  . Alcohol use: Yes    Comment: 2 glasses wine per day (small)  . Drug use: No  . Sexual activity: Not on file    Objective:   Constitutional Danny Frederick is a pleasant 86 y.o. Caucasian male, WD, WN in NAD. AAO x 3.   Vascular Dorsalis pedis pulses palpable bilaterally. Posterior tibial pulses palpable bilaterally. Capillary refill normal to all digits.No cyanosis or clubbing noted. Pedal hair growth sparse b/l. No pain with calf compression b/l. No edema noted b/l lower extremities.  Neurologic Normal speech. Oriented to person, place, and time. Protective sensation intact 5/5 intact bilaterally with 10g monofilament b/l. Vibratory sensation intact b/l. Proprioception intact bilaterally.  Dermatologic Pedal skin with normal turgor, texture and tone b/l.  Toenails are discolored, thick, elongated, dystrophic with pain on palpation x 10. No open wounds. No skin lesions. Porokeratotic lesion submet head 4 left foot with tenderness to palpation. No erythema, no edema, no drainage, no flocculence.  Orthopedic: Normal muscle strength 5/5 to all lower extremity muscle groups bilaterally. No pain crepitus or joint limitation noted with ROM b/l. No gross bony deformities bilaterally. Patient ambulates independent of any assistive aids. No bony tenderness.    Radiographs: None Assessment:   1. Pain due to onychomycosis of toenails of both feet   2.  Porokeratosis   3. Left foot pain    Plan:  Patient was evaluated and treated and all questions answered.  Onychomycosis with pain -Nails palliatively debridement as below -Educated on self-care  Procedure: Nail Debridement Rationale: Pain Type of Debridement: manual, sharp debridement. Instrumentation: Nail nipper, rotary burr. Number of Nails: 10 -Examined patient. -No new findings. No new orders. -Toenails 1-5 b/l were debrided in length and girth with sterile nail nippers and dremel without iatrogenic bleeding.  -Painful porokeratotic lesion(s) submet head 4 left foot pared and enucleated with sterile scalpel blade without incident. -Patient to report any pedal injuries to medical professional immediately. -Patient to continue soft, supportive shoe gear daily. -Patient/POA to call should there be question/concern in the interim.  Return in about 3 months (around 09/26/2020) for toenail debridement w/corn(s)/callus(es).  Marzetta Board, DPM

## 2020-07-24 ENCOUNTER — Ambulatory Visit: Payer: Medicare Other

## 2020-07-27 ENCOUNTER — Other Ambulatory Visit: Payer: Self-pay | Admitting: Cardiology

## 2020-07-27 DIAGNOSIS — I10 Essential (primary) hypertension: Secondary | ICD-10-CM

## 2020-08-15 ENCOUNTER — Encounter: Payer: Self-pay | Admitting: Family Medicine

## 2020-08-28 ENCOUNTER — Other Ambulatory Visit: Payer: Self-pay

## 2020-08-28 ENCOUNTER — Ambulatory Visit (INDEPENDENT_AMBULATORY_CARE_PROVIDER_SITE_OTHER): Payer: Medicare Other

## 2020-08-28 DIAGNOSIS — Z23 Encounter for immunization: Secondary | ICD-10-CM | POA: Diagnosis not present

## 2020-09-14 DIAGNOSIS — H40013 Open angle with borderline findings, low risk, bilateral: Secondary | ICD-10-CM | POA: Diagnosis not present

## 2020-10-05 ENCOUNTER — Ambulatory Visit (INDEPENDENT_AMBULATORY_CARE_PROVIDER_SITE_OTHER): Payer: Medicare Other | Admitting: Podiatry

## 2020-10-05 ENCOUNTER — Encounter: Payer: Self-pay | Admitting: Podiatry

## 2020-10-05 ENCOUNTER — Other Ambulatory Visit: Payer: Self-pay

## 2020-10-05 DIAGNOSIS — M79674 Pain in right toe(s): Secondary | ICD-10-CM

## 2020-10-05 DIAGNOSIS — M79672 Pain in left foot: Secondary | ICD-10-CM | POA: Diagnosis not present

## 2020-10-05 DIAGNOSIS — B351 Tinea unguium: Secondary | ICD-10-CM | POA: Diagnosis not present

## 2020-10-05 DIAGNOSIS — Q828 Other specified congenital malformations of skin: Secondary | ICD-10-CM

## 2020-10-05 DIAGNOSIS — M79675 Pain in left toe(s): Secondary | ICD-10-CM

## 2020-10-05 DIAGNOSIS — L84 Corns and callosities: Secondary | ICD-10-CM | POA: Diagnosis not present

## 2020-10-05 NOTE — Progress Notes (Signed)
Subjective:  Patient ID: Danny Frederick, male    DOB: [birth year],  MRN: [id number]  Danny Frederick presents to clinic today for painful porokeratotic lesion(s) plantar left foot and painful mycotic toenails that limit ambulation. Aggravating factors include weightbearing with and without shoe gear. Pain for both is relieved with periodic professional debridement.  87 y.o. male presents with the above complaint.  Reports no other concerns on today's visit.  Review of Systems: Negative except as noted in the HPI. Past Medical History:  Diagnosis Date  . Allergic conjunctivitis   . Allergic rhinitis, cause unspecified    on allergy shots (Dr. Orvil Feil)  . Atherosclerosis of aorta (HCC)    noted on CT angio of abd/pelvis, in many vessels  . BPH (benign prostatic hypertrophy)   . Diverticulosis of colon 12/09  . Foot drop, right   . GERD (gastroesophageal reflux disease)   . Hearing loss in left ear    both ears now  . History of Clostridium difficile    multiple times 2016--s/p fecal transplant (Dr. Baxter Flattery).  Marland Kitchen Hx of echocardiogram    Echo (03/2014):  Mild LVH, EF 50-55%, no RWMA, Gr 1 DD, mild MR, mild reduced RVSF  . Hypertension   . Internal hemorrhoids   . Iron deficiency anemia, unspecified 6/09  . Mitral valve prolapse    h/o; normal echo 12/2011 with no MVP seen  . 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 Right ventricular dysfunction 01/23/2014   Secondary to obstruction of main pulmonary artery  . Ruptured thoracic aortic aneurysm ([place]) 01/23/2014  . S/P ascending aortic aneurysm repair 01/24/2014  . Unspecified hypothyroidism    Past Surgical History:  Procedure Laterality Date  . CERVICAL LAMINECTOMY  1972   C5-6  . COLONOSCOPY WITH PROPOFOL N/A 11/09/2014   Procedure: COLONOSCOPY WITH PROPOFOL;  Surgeon: Jerene Bears, MD;  [facility];  Service: Gastroenterology;  Laterality: N/A;  . ESOPHAGOGASTRODUODENOSCOPY  10/31/08   normal; Dr. Edison Nasuti  . FECAL  TRANSPLANT N/A 11/09/2014   Procedure: FECAL TRANSPLANT;  Surgeon: Jerene Bears, MD;  [facility];  Service: Gastroenterology;  Laterality: N/A;  . HEMORRHOIDECTOMY WITH HEMORRHOID BANDING  04/07/13  . hemorroidal banding  04/07/2013   x3-Dr.Eric Redmond Pulling  . PROSTATE SURGERY  2007   photovaporization  . ROTATOR CUFF REPAIR  10/2008   left; Dr. Onnie Graham  . SPINE SURGERY  2006   L4-5 disk surgery  . SPINE SURGERY  04/2013   L4-5, L5-S1 fusion.  Dr. Christella Noa  . THORACIC AORTIC ANEURYSM REPAIR N/A 01/23/2014   Procedure: THORACIC ASCENDING ANEURYSM REPAIR ([facility]);  Surgeon: Rexene Alberts, MD;  Location: [place];  Service: Open Heart Surgery;  Laterality: N/A;  . TONSILLECTOMY      Current Outpatient Medications:  .  amLODipine (NORVASC) 5 MG tablet, TAKE 1 TABLET BY MOUTH  TWICE DAILY, Disp: 180 tablet, Rfl: 2 .  atorvastatin (LIPITOR) 10 MG tablet, Take 1 tablet (10 mg total) by mouth daily., Disp: 90 tablet, Rfl: 3 .  Cholecalciferol (VITAMIN D3) 2000 units TABS, Take 2,000 Units by mouth daily. , Disp: , Rfl:  .  citalopram (CELEXA) 20 MG tablet, Take 1 tablet (20 mg total) by mouth daily., Disp: 90 tablet, Rfl: 3 .  Ferrous Sulfate (IRON) 325 (65 Fe) MG TABS, Take 1 tablet (325 mg total) by mouth every other day. Reported on 11/08/2015, Disp: 30 each, Rfl: 0 .  ipratropium (ATROVENT) 0.03 % nasal spray, Place 2 sprays into both nostrils 2 (  two) times daily as needed for rhinitis. , Disp: , Rfl:  .  irbesartan (AVAPRO) 300 MG tablet, TAKE 1 TABLET BY MOUTH  DAILY, Disp: 90 tablet, Rfl: 3 .  metoprolol tartrate (LOPRESSOR) 25 MG tablet, TAKE 1 TABLET BY MOUTH  TWICE DAILY, Disp: 180 tablet, Rfl: 2 .  mupirocin ointment (BACTROBAN) 2 %, mupirocin 2 % topical ointment, Disp: , Rfl:  .  omeprazole (PRILOSEC) 20 MG capsule, TAKE 1 CAPSULE BY MOUTH  EVERY OTHER DAY, Disp: 45 capsule, Rfl: 3 .  OVER THE COUNTER MEDICATION, Place 1 application into both eyes daily as needed (dry eyes/irritation). OTC  eye gel, Disp: , Rfl:  .  SYNTHROID 50 MCG tablet, TAKE 1 TABLET BY MOUTH  DAILY BEFORE BREAKFAST, Disp: 90 tablet, Rfl: 3 .  triamcinolone cream (KENALOG) 0.1 %, Apply 1 application topically 2 (two) times daily as needed., Disp: 454 g, Rfl: 0 No Known Allergies Social History   Occupational History  . Not on file  Tobacco Use  . Smoking status: Former Smoker    Types: Cigarettes    Quit date: 09/01/1977    Years since quitting: 43.1  . Smokeless tobacco: Never Used  Vaping Use  . Vaping Use: Never used  Substance and Sexual Activity  . Alcohol use: Yes    Comment: 2 glasses wine per day (small)  . Drug use: No  . Sexual activity: Not on file    Objective:   Constitutional Danny Frederick is a pleasant 87 y.o. Caucasian male, WD, WN in NAD. AAO x 3.   Vascular Dorsalis pedis pulses palpable bilaterally. Posterior tibial pulses palpable bilaterally. Capillary refill normal to all digits.No cyanosis or clubbing noted. Pedal hair growth sparse b/l. No pain with calf compression b/l. No edema noted b/l lower extremities.  Neurologic Normal speech. Oriented to person, place, and time. Protective sensation intact 5/5 intact bilaterally with 10g monofilament b/l. Vibratory sensation intact b/l. Proprioception intact bilaterally.  Dermatologic Pedal skin with normal turgor, texture and tone b/l.  Toenails are discolored, thick, elongated, dystrophic with pain on palpation x 10. No open wounds. No skin lesions. Hyperkeratotic lesion submet head 4 left foot with tenderness to palpation. No erythema, no edema, no drainage, no flocculence.  Orthopedic: Normal muscle strength 5/5 to all lower extremity muscle groups bilaterally. No pain crepitus or joint limitation noted with ROM b/l. No gross bony deformities bilaterally. Patient ambulates independent of any assistive aids. No bony tenderness.    Radiographs: None Assessment:   1. Pain due to onychomycosis of toenails of both feet   2.  Porokeratosis   3. Left foot pain    Plan:  Patient was evaluated and treated and all questions answered.  Onychomycosis with pain -Nails palliatively debridement as below -Educated on self-care  Procedure: Nail Debridement Rationale: Pain Type of Debridement: manual, sharp debridement. Instrumentation: Nail nipper, rotary burr. Number of Nails: 10 -Examined patient. -No new findings. No new orders. -Medicare ABN signed for this year. Patient consents for services of paring of corns/calluses  today. Copy given to patient on today's visit and copy placed in patient's chart. -Toenails 1-5 b/l were debrided in length and girth with sterile nail nippers and dremel without iatrogenic bleeding.  -Callus(es) submet head 4 left foot pared utilizing sterile scalpel blade without complication or incident. Total number debrided =1. -Patient to report any pedal injuries to medical professional immediately. -Patient/POA to call should there be question/concern in the interim.   Return in about 3 months (around  01/02/2021).  Marzetta Board, DPM

## 2020-10-14 NOTE — Progress Notes (Signed)
Chief Complaint  Patient presents with  . Medicare Wellness    Nonfasting AWV. No concerns. PHQ9( )    Danny Frederick is a 87 y.o. male who presents for Medicare annual wellness visit and follow-up on chronic medical conditions.  He is accompanied by his daughter Ivin Booty. He has the following concerns--he continues to have trouble with balance, fatigue.  Hypertension: This has been managed by Dr. Stanford Breed, lastsaw him in February, 2021, has appointment scheduled for April.  BP has been well controlled on current regimen of amlodipine 5mg  BID, Avapro 300mg  daily and lopressor 25mg  BID. He hasn't been checking his BP's at home regularly. He denies headaches or side effects. Denies chest pain or palpitations. Has some dizziness, short-lived, almost like he could pass out.  This occurs about once a week.  He reports he never feels thirsty, doesn't drink more than 2 cups of water/day. He drinks 3 cups of black tea daily (small cups). He also has 2 glasses of wine/day (with ice cubes, small amount).  He feels short of breath with activity, even just walking across the room.  This was noticed by his other daughter (an RT) when she visited a few months ago. It has gotten slightly worse.  No dyspnea at rest or with bathing.  BP Readings from Last 3 Encounters:  10/15/20 (!) 90/50  03/12/20 124/76  12/23/19 (!) 145/71   He continues to have balance issues since the stroke.  Uses a walker, even in his house.  Depression: He was started on citalopram in December2019, at which time his PHQ-9 scorewas12. He continues to do well as far as moods.He continues on citalopram and denies side effects. "I don't feel depressed". Per daughter, he has stated repeatedly  "people live too long and I don't know why I'm here". His wife has some dementia, and pt/daughter feel that citalopram has helped him a lot (in dealing with her).  Hyperlipidemia--toleratingatorvastatinwithout side effects.He follows a  lowfat, low cholesterol diet.He has been at goal on this dose. He has had aortic atherosclerosis noted on imaging studies (most recently CT 03/2018).  He is s/p thoracic aortic aneurysm repair. Lab Results  Component Value Date   CHOL 144 03/09/2020   HDL 68 03/09/2020   LDLCALC 61 03/09/2020   TRIG 79 03/09/2020   CHOLHDL 2.1 03/09/2020    Hypothyroidism: Denies any symptoms--no changes in weight, moods, skin/nails. Bowels are normal--has occasional fecal incontinence. He uses imodium sometimes prior to going outSomedecrease in energy/fatigue over the last few years that herelatesto age, declining in the last 6 months. Prior to his stroke (and the COVID pandemic), his exercise has been decreasing--he was walking 30 minutes (rather than an hour), and had cut back on going to the gym from 3 days to 2 days/week. He no longer goes to the gym.  Uses a walker due to his balance.  Does some weight-bearing exercise at home (2# weighs). He now is only walking 5 minutes at a time. He is "okay if moving--can't stand without moving, due to balance) off balance to stand in the kitchen to make an egg. Not driving.  He is on branded Synthroid, 11mcg daily. He is compliant with taking his thyroidmedication on an empty stomach; though he takes it along with other medications, as always, but now including his vitamins . Lab Results  Component Value Date   TSH 3.630 03/09/2020    GERD:He last reported that watering down the wine has helped his reflux (puts over ice, smaller volume).  He has been doing well taking 20mg  qod (bumps up to 40mg  daily only if has a significant flare, hasn't needed to do this in a long time).    Impaired fasting glucose--He has a history of mildly elevated sugars in the past, with A1c of 5.7.Doesn't drink soda, doesn't add sugar to his tea.Tries to limit his sweets. Lab Results  Component Value Date   HGBA1C 5.7 (H) 03/09/2020    Low Vitamin D level, noted to  be 26 in February, 2018. Level was normal in April 2019 (40.9), when taking 2000 IU daily. Last level was 32.1 in 03/2020. He continues to take 2000 IU daily.  Pt is s/pleft basal gangliaintracranial hemorrhage, felt to berelated to hypertensive crisis, in 12/2018.He has done well since then. He last saw neuro in June of 2020.  He was due to f/u in 05/2019, but cancelled due to not feeling well.  He never rescheduled this. His balance still isn't back to normal, uses a walker most of the time now, even at home (though not to go to the bathroom at night).  He did have a fall in April--he tripped and fell when walking in for his cataract surgery (ER visit). He uses the stair lift in his home.  Immunization History  Administered Date(s) Administered  . Fluad Quad(high Dose 65+) 05/04/2019, 06/19/2020  . Influenza Split 06/13/2011  . Influenza, High Dose Seasonal PF 06/09/2013, 05/23/2014, 05/14/2015, 04/23/2016, 04/28/2017, 05/13/2018  . Moderna SARS-COV2 Booster Vaccination 08/28/2020  . Moderna Sars-Covid-2 Vaccination 09/15/2019, 10/13/2019  . Pneumococcal Conjugate-13 06/15/2013  . Pneumococcal Polysaccharide-23 09/02/2003, 10/03/2015  . Tdap 10/02/2005, 10/04/2015  . Zoster 05/27/2007  . Zoster Recombinat (Shingrix) 04/22/2019, 07/12/2019   Last colonoscopy: 12/09, and again 10/2014 at time of fecal transplant. Skin tag and diverticulosis were noted. Letter from Dr. Hilarie Fredrickson 10/2019, repeat not needed. Last PSA: January 2016, 1.31 Dentist: once a year, went recently (has upper dentures only). Ophtho: yearly  Exercise: currently none, "I got out of the habit", and is too tired. (Previous routine had been:gym 2x/week, and walks 2-3 times/week for 30 minutes. At gym he used weights,recumbent bike, treadmill.)   Other doctors caring for patient include: Neurosurgeon: Dr. Christella Noa (no longer sees) Education officer, environmental (hemorrhoids): Dr. Redmond Pulling (no longer sees) Ophtho: Dr. Idolina Primer GI: Dr.  Hilarie Fredrickson  Dr. Valda Favia at [facility] (audiologist) ID--Dr. Baxter Flattery CV surgeon--Dr. Roxy Manns Cardiology--Dr. Stanford Breed Allergist--Dr. Orvil Feil Orthopedist--Dr. Supple Podiatrist--Dr. Elisha Ponder PM&R: Dr. Letta Pate Neuro: Claris Gower, NP (saw once in 01/2019) Dentist: Dr. Mancel Bale at [facility]  Depression screen: PHQ-9 score of 6 Fall screen:2; [place] 12/2019 (tripped, on way in for cataract surgery, mild concussion) Functional status screen: notable for using hearing aids, difficulty walking since stroke (has stair lift, uses walker), some trouble bathing. Daughter notes him being a little more forgetful. He is still managing his bills.  Mini-Cog screen: recalled 2/3 words.  Normal clock. Score 4/5. See full screens in epic.  End of Life Discussion: Patient hasa living will and medical power of attorney. He is DNR, per discussion 10/2019, and has forms.  PMH, PSH, SH and FH were reviewed/updated  Outpatient Encounter Medications as of 10/15/2020  Medication Sig Note  . amLODipine (NORVASC) 5 MG tablet TAKE 1 TABLET BY MOUTH  TWICE DAILY   . atorvastatin (LIPITOR) 10 MG tablet Take 1 tablet (10 mg total) by mouth daily.   . Cholecalciferol (VITAMIN D3) 2000 units TABS Take 2,000 Units by mouth daily.    . citalopram (CELEXA) 20 MG tablet  Take 1 tablet (20 mg total) by mouth daily.   . Ferrous Sulfate (IRON) 325 (65 Fe) MG TABS Take 1 tablet (325 mg total) by mouth every other day. Reported on 11/08/2015   . irbesartan (AVAPRO) 300 MG tablet TAKE 1 TABLET BY MOUTH  DAILY   . metoprolol tartrate (LOPRESSOR) 25 MG tablet TAKE 1 TABLET BY MOUTH  TWICE DAILY   . omeprazole (PRILOSEC) 20 MG capsule TAKE 1 CAPSULE BY MOUTH  EVERY OTHER DAY   . OVER THE COUNTER MEDICATION Place 1 application into both eyes daily as needed (dry eyes/irritation). OTC eye gel 08/04/2019: occasionally  . SYNTHROID 50 MCG tablet TAKE 1 TABLET BY MOUTH  DAILY BEFORE BREAKFAST   . [DISCONTINUED] ipratropium (ATROVENT) 0.03 %  nasal spray Place 2 sprays into both nostrils 2 (two) times daily as needed for rhinitis.  (Patient not taking: Reported on 10/15/2020)   . [DISCONTINUED] mupirocin ointment (BACTROBAN) 2 % mupirocin 2 % topical ointment   . [DISCONTINUED] triamcinolone cream (KENALOG) 0.1 % Apply 1 application topically 2 (two) times daily as needed.    No facility-administered encounter medications on file as of 10/15/2020.   No Known Allergies   ROS: The patient denies anorexia, fever, headaches,ear pain, hoarseness, chest pain, palpitations, dizziness, syncope, dyspnea on exertion, cough, swelling, nausea, vomiting, constipation, abdominal pain, melena, hematochezia, indigestion/heartburn, hematuria, incontinence, erectile dysfunction, nocturia, weakened urine stream, dysuria, genital lesions, joint pains, weakness, tremor, suspicious skin lesions, depression, anxiety, abnormal bleeding/bruising, or enlarged lymph nodes. Morning stiffness throughout Hearingloss--has bilateral hearing aids. Not working as well as in the past. Numbness in arms resolved, slight in his fingertips (both hands) Balance problems per HPI. Up 2x/night to void, denies urinary complaints.    PHYSICAL EXAM:  BP (!) 90/50   Pulse 60   Ht 5\' 3"  (1.6 m)   Wt 142 lb 6.4 oz (64.6 kg)   BMI 25.23 kg/m   Wt Readings from Last 3 Encounters:  10/15/20 142 lb 6.4 oz (64.6 kg)  03/12/20 139 lb 12.8 oz (63.4 kg)  12/23/19 135 lb (61.2 kg)    General Appearance:   Alert, cooperative, no distress, frail/thin elderly gentleman  Head:   Normocephalic, without obvious abnormality, atraumatic  Eyes:   PERRL, conjunctiva/corneas clear, EOM's intact, fundi benign  Ears:   normal external ears. +hearing aids. TM's and EAC's normal  Nose:  Not examined, wearing mask due to COVID-19 pandemic  Throat:  Not examined, wearing mask due to COVID-19 pandemic  Neck:  Supple, no lymphadenopathy; thyroid:  noenlargement/tenderness/ nodules; no carotid bruit or JVD  Back:  Spine nontender, no curvature, no CVA tenderness. WHSS midline spine,  Lungs:   No wheezes or ronchi; respirations unlabored. Some crackles at both bases  Chest Wall:   No tenderness. WHSS midline chest, with many small vessels across the scar (making scar appear blue-tinged)   Heart:   Regular rate and rhythm, S1 and S2 normal, no murmur, rub or gallop  Breast Exam:   No chest wall tenderness, masses or gynecomastia  Abdomen:   Soft, non-tender, nondistended, normoactive bowel sounds, no masses, no hepatosplenomegaly  Genitalia:  Exam declined by patient  Rectal:  Exam declined by patient  Extremities:  No clubbing, cyanosis or edema.  Pulses:  2+ and symmetric all extremities  Skin:  Skin color, texture, turgor normal, no rashes or lesions. Some actinic changes to skin  Lymph nodes:  Cervical, supraclavicular, and axillary nodes normal  Neurologic:  CNII-XII intact, normal strength, sensation  and gait; reflexes 2+ and symmetric throughout   Psych: Normal mood, affect, hygiene and grooming.  PHQ-9 score of 6 (see screen in epic)   ASSESSMENT/PLAN:  Medicare annual wellness visit, subsequent  Hypothyroidism, unspecified type - adequately replaced, cont Synthroid - Plan: TSH  Essential hypertension - BP low today; to monitor/record and bring list to cardiology; consider decrease dose if remains low (h/o hemorrhagic CVA due to high BP) - Plan: Comprehensive metabolic panel  Pure hypercholesterolemia - cont atorvastatin, low cholesterol diet  Vitamin D deficiency  Depression, major, in remission ([place]) - discussed in detail--not really depressed, some aging issues. Overall doing well, and agreeable to cont SSRI  Atherosclerosis of aorta (HCC) - cont statin  Gastroesophageal reflux disease, unspecified whether esophagitis present -  doing well on qod PPI  Advanced directives, counseling/discussion - prior MOST form voided; new form completed, see scanned sheet, original given to pt. DNR  Fatigue, unspecified type - overall gradually declining - Plan: CBC with Differential/Platelet, Comprehensive metabolic panel, TSH  Medication monitoring encounter - Plan: CBC with Differential/Platelet, Comprehensive metabolic panel, TSH  Balance problems - since hemorrhagic stroke. Significant decline in his activities, trouble with ADL's--will refer for home PT  History of stroke - hemorrhagic, felt to be related to HTN, so will be careful in adjusting BP meds. Low today. Increase fluids, monitor BP's and if remains low/dizzy, call cards  Dyspnea on exertion - Plan: DG Chest 2 View  DNR (do not resuscitate)   Not screening PSA due to age; recommended at least 30 minutes of aerobic activity at least 5 days/week, weight-bearing exercise at least 2x/week; proper sunscreen use reviewed; healthy diet and alcohol recommendations (less than or equal to 2 drinks/day) reviewed; regular seatbelt use; changing batteries in smoke detectors. Immunization recommendations discussed--UTD. Continue yearly high dose flu shots. Colonoscopy recommendations reviewed, UTD.  Has POA and living will. DNR, pt has forms (completed 10/2019). This was done virtually, forms mailed, has them at home. Old MOST form voided, new form completed after full discussion.   Medicare Attestation I have personally reviewed: The patient's medical and social history Their use of alcohol, tobacco or illicit drugs Their current medications and supplements The patient's functional ability including ADLs,fall risks, home safety risks, cognitive, and hearing and visual impairment Diet and physical activities Evidence for depression or mood disorders  The patient's weight, height and BMI have been recorded in the chart.  I have made referrals, counseling, and provided  education to the patient based on review of the above and I have provided the patient with a written personalized care plan for preventive services.

## 2020-10-14 NOTE — Patient Instructions (Addendum)
  HEALTH MAINTENANCE RECOMMENDATIONS:  It is recommended that you get at least 30 minutes of aerobic exercise at least 5 days/week (for weight loss, you may need as much as 60-90 minutes). This can be any activity that gets your heart rate up. This can be divided in 10-15 minute intervals if needed, but try and build up your endurance at least once a week.  Weight bearing exercise is also recommended twice weekly.  Eat a healthy diet with lots of vegetables, fruits and fiber.  "Colorful" foods have a lot of vitamins (ie green vegetables, tomatoes, red peppers, etc).  Limit sweet tea, regular sodas and alcoholic beverages, all of which has a lot of calories and sugar.  Up to 2 alcoholic drinks daily may be beneficial for men (unless trying to lose weight, watch sugars).  Drink a lot of water.  Sunscreen of at least SPF 30 should be used on all sun-exposed parts of the skin when outside between the hours of 10 am and 4 pm (not just when at beach or pool, but even with exercise, golf, tennis, and yard work!)  Use a sunscreen that says "broad spectrum" so it covers both UVA and UVB rays, and make sure to reapply every 1-2 hours.  Remember to change the batteries in your smoke detectors when changing your clock times in the spring and fall.  Carbon monoxide detectors are recommended for your home.  Use your seat belt every time you are in a car, and please drive safely and not be distracted with cell phones and texting while driving.   Danny Frederick , Thank you for taking time to come for your Medicare Wellness Visit. I appreciate your ongoing commitment to your health goals. Please review the following plan we discussed and let me know if I can assist you in the future.    This is a list of the screening recommended for you and due dates:  Health Maintenance  Topic Date Due  . Tetanus Vaccine  10/03/2025  . Flu Shot  Completed  . COVID-19 Vaccine  Completed  . Pneumonia vaccines  Completed    Please monitor your blood pressure, record on a sheet and bring these values to your visit with Dr. Stanford Breed.  You need to be drinking more water (and less caffeine and wine).  You will feel more lightheaded/dizzy and tired if you aren't well hydrated. Please stop drinking nightly wine (1/2 glass at most only). Switching to non-caffeinated tea (herbal tea) is a good substitute.  Please resume doing the home exercises you were given after physical therapy after your stroke, if you're able to.  We are going to put in a referral for home PT--to help with getting around the home, working with balance so it'll be easier to do your regular activities (ie fixing food, etc).

## 2020-10-15 ENCOUNTER — Ambulatory Visit (INDEPENDENT_AMBULATORY_CARE_PROVIDER_SITE_OTHER): Payer: Medicare Other | Admitting: Family Medicine

## 2020-10-15 ENCOUNTER — Encounter: Payer: Self-pay | Admitting: Family Medicine

## 2020-10-15 ENCOUNTER — Ambulatory Visit
Admission: RE | Admit: 2020-10-15 | Discharge: 2020-10-15 | Disposition: A | Discharge status: Home / Self Care | Payer: Medicare Other | Source: Ambulatory Visit | Attending: Family Medicine | Admitting: Family Medicine

## 2020-10-15 ENCOUNTER — Other Ambulatory Visit: Payer: Self-pay

## 2020-10-15 VITALS — BP 90/50 | HR 60 | Ht 63.0 in | Wt 142.4 lb

## 2020-10-15 DIAGNOSIS — R5383 Other fatigue: Secondary | ICD-10-CM

## 2020-10-15 DIAGNOSIS — R0600 Dyspnea, unspecified: Secondary | ICD-10-CM

## 2020-10-15 DIAGNOSIS — Z0000 Encounter for general adult medical examination without abnormal findings: Secondary | ICD-10-CM

## 2020-10-15 DIAGNOSIS — E559 Vitamin D deficiency, unspecified: Secondary | ICD-10-CM

## 2020-10-15 DIAGNOSIS — E039 Hypothyroidism, unspecified: Secondary | ICD-10-CM

## 2020-10-15 DIAGNOSIS — I10 Essential (primary) hypertension: Secondary | ICD-10-CM | POA: Diagnosis not present

## 2020-10-15 DIAGNOSIS — Z5181 Encounter for therapeutic drug level monitoring: Secondary | ICD-10-CM

## 2020-10-15 DIAGNOSIS — R2689 Other abnormalities of gait and mobility: Secondary | ICD-10-CM | POA: Diagnosis not present

## 2020-10-15 DIAGNOSIS — I700 Atherosclerosis of aorta: Secondary | ICD-10-CM | POA: Diagnosis not present

## 2020-10-15 DIAGNOSIS — R0609 Other forms of dyspnea: Secondary | ICD-10-CM | POA: Diagnosis not present

## 2020-10-15 DIAGNOSIS — Z66 Do not resuscitate: Secondary | ICD-10-CM | POA: Diagnosis not present

## 2020-10-15 DIAGNOSIS — K219 Gastro-esophageal reflux disease without esophagitis: Secondary | ICD-10-CM | POA: Diagnosis not present

## 2020-10-15 DIAGNOSIS — F325 Major depressive disorder, single episode, in full remission: Secondary | ICD-10-CM | POA: Diagnosis not present

## 2020-10-15 DIAGNOSIS — E7800 Pure hypercholesterolemia, unspecified: Secondary | ICD-10-CM

## 2020-10-15 DIAGNOSIS — Z8673 Personal history of transient ischemic attack (TIA), and cerebral infarction without residual deficits: Secondary | ICD-10-CM

## 2020-10-15 DIAGNOSIS — Z7189 Other specified counseling: Secondary | ICD-10-CM | POA: Diagnosis not present

## 2020-10-16 ENCOUNTER — Encounter: Payer: Self-pay | Admitting: Family Medicine

## 2020-10-16 LAB — CBC WITH DIFFERENTIAL/PLATELET
Basophils Absolute: 0 10*3/uL (ref 0.0–0.2)
Basos: 0 %
EOS (ABSOLUTE): 0.1 10*3/uL (ref 0.0–0.4)
Eos: 2 %
Hematocrit: 40 % (ref 37.5–51.0)
Hemoglobin: 13.2 g/dL (ref 13.0–17.7)
Immature Grans (Abs): 0 10*3/uL (ref 0.0–0.1)
Immature Granulocytes: 0 %
Lymphocytes Absolute: 1.9 10*3/uL (ref 0.7–3.1)
Lymphs: 25 %
MCH: 28.8 pg (ref 26.6–33.0)
MCHC: 33 g/dL (ref 31.5–35.7)
MCV: 87 fL (ref 79–97)
Monocytes Absolute: 0.7 10*3/uL (ref 0.1–0.9)
Monocytes: 10 %
Neutrophils Absolute: 4.9 10*3/uL (ref 1.4–7.0)
Neutrophils: 63 %
Platelets: 343 10*3/uL (ref 150–450)
RBC: 4.59 x10E6/uL (ref 4.14–5.80)
RDW: 12.7 % (ref 11.6–15.4)
WBC: 7.7 10*3/uL (ref 3.4–10.8)

## 2020-10-16 LAB — TSH: TSH: 1.71 u[IU]/mL (ref 0.450–4.500)

## 2020-10-16 LAB — COMPREHENSIVE METABOLIC PANEL
ALT: 12 IU/L (ref 0–44)
AST: 26 IU/L (ref 0–40)
Albumin/Globulin Ratio: 1.2 (ref 1.2–2.2)
Albumin: 4.4 g/dL (ref 3.6–4.6)
Alkaline Phosphatase: 74 IU/L (ref 44–121)
BUN/Creatinine Ratio: 17 (ref 10–24)
BUN: 22 mg/dL (ref 8–27)
Bilirubin Total: 1 mg/dL (ref 0.0–1.2)
CO2: 21 mmol/L (ref 20–29)
Calcium: 10.2 mg/dL (ref 8.6–10.2)
Chloride: 98 mmol/L (ref 96–106)
Creatinine, Ser: 1.27 mg/dL (ref 0.76–1.27)
GFR calc Af Amer: 60 mL/min/{1.73_m2} (ref >59)
GFR calc non Af Amer: 52 mL/min/{1.73_m2} — ABNORMAL LOW (ref >59)
Globulin, Total: 3.6 g/dL (ref 1.5–4.5)
Glucose: 80 mg/dL (ref 65–99)
Potassium: 4.7 mmol/L (ref 3.5–5.2)
Sodium: 137 mmol/L (ref 134–144)
Total Protein: 8 g/dL (ref 6.0–8.5)

## 2020-10-17 ENCOUNTER — Other Ambulatory Visit: Payer: Self-pay | Admitting: *Deleted

## 2020-10-17 DIAGNOSIS — R269 Unspecified abnormalities of gait and mobility: Secondary | ICD-10-CM

## 2020-10-20 DIAGNOSIS — F329 Major depressive disorder, single episode, unspecified: Secondary | ICD-10-CM | POA: Diagnosis not present

## 2020-10-20 DIAGNOSIS — I10 Essential (primary) hypertension: Secondary | ICD-10-CM | POA: Diagnosis not present

## 2020-10-20 DIAGNOSIS — E039 Hypothyroidism, unspecified: Secondary | ICD-10-CM | POA: Diagnosis not present

## 2020-10-20 DIAGNOSIS — E785 Hyperlipidemia, unspecified: Secondary | ICD-10-CM | POA: Diagnosis not present

## 2020-10-20 DIAGNOSIS — I69398 Other sequelae of cerebral infarction: Secondary | ICD-10-CM | POA: Diagnosis not present

## 2020-10-20 DIAGNOSIS — R2681 Unsteadiness on feet: Secondary | ICD-10-CM | POA: Diagnosis not present

## 2020-10-23 DIAGNOSIS — R2681 Unsteadiness on feet: Secondary | ICD-10-CM | POA: Diagnosis not present

## 2020-10-23 DIAGNOSIS — E039 Hypothyroidism, unspecified: Secondary | ICD-10-CM | POA: Diagnosis not present

## 2020-10-23 DIAGNOSIS — I69398 Other sequelae of cerebral infarction: Secondary | ICD-10-CM | POA: Diagnosis not present

## 2020-10-23 DIAGNOSIS — E785 Hyperlipidemia, unspecified: Secondary | ICD-10-CM | POA: Diagnosis not present

## 2020-10-23 DIAGNOSIS — I10 Essential (primary) hypertension: Secondary | ICD-10-CM | POA: Diagnosis not present

## 2020-10-23 DIAGNOSIS — F329 Major depressive disorder, single episode, unspecified: Secondary | ICD-10-CM | POA: Diagnosis not present

## 2020-12-04 ENCOUNTER — Ambulatory Visit: Payer: Medicare Other | Admitting: Cardiology

## 2020-12-06 ENCOUNTER — Other Ambulatory Visit: Payer: Self-pay

## 2020-12-06 ENCOUNTER — Ambulatory Visit (INDEPENDENT_AMBULATORY_CARE_PROVIDER_SITE_OTHER): Payer: Medicare Other | Admitting: Cardiology

## 2020-12-06 ENCOUNTER — Encounter: Payer: Self-pay | Admitting: Cardiology

## 2020-12-06 VITALS — BP 120/70 | HR 59 | Ht 62.0 in | Wt 141.0 lb

## 2020-12-06 DIAGNOSIS — Z8679 Personal history of other diseases of the circulatory system: Secondary | ICD-10-CM | POA: Diagnosis not present

## 2020-12-06 DIAGNOSIS — I10 Essential (primary) hypertension: Secondary | ICD-10-CM | POA: Diagnosis not present

## 2020-12-06 DIAGNOSIS — R0602 Shortness of breath: Secondary | ICD-10-CM | POA: Diagnosis not present

## 2020-12-06 DIAGNOSIS — Z9889 Other specified postprocedural states: Secondary | ICD-10-CM | POA: Diagnosis not present

## 2020-12-06 DIAGNOSIS — E7800 Pure hypercholesterolemia, unspecified: Secondary | ICD-10-CM

## 2020-12-06 NOTE — Patient Instructions (Signed)

## 2020-12-06 NOTE — Progress Notes (Signed)
HPI: FU TAA repair; also with micturition syncope, HTN. Admitted 12/2013 with a contained, ruptured ascending thoracic aortic aneurysm in the setting of streptococcal pneumoniae bacteremia/aortitis. He underwent repair of the ascending thoracic aortic aneurysm with a 32 mm Hemashield straight graft. He was also followed by infectious disease and treated with antibiotics. The patient was noted to have clot in the main pulmonary artery. This was likely related to compression of the pulmonary artery by the false aneurysm. He wastreated withCoumadin. Monitor August 2018 showed sinus bradycardia to sinus rhythm with PACs and PVCs.CTA 7/19 showed stable repair of TAA.Patient having near syncopal episodes at previous office visit. Monitor November 2019 showed sinus bradycardia, normal sinus rhythm, sinus tachycardia and PACs. Echocardiogram April 2020 showed normal LV function, mild right atrial enlargement.Patient had non-traumatic hemorrhagic CVA April 2020. Eventfelt secondary to uncontrolled hypertension. Since last seen,patient notes dyspnea with activities in particular but occasionally at rest.  No orthopnea, PND, pedal edema, chest pain or syncope.  Current Outpatient Medications  Medication Sig Dispense Refill  . amLODipine (NORVASC) 5 MG tablet TAKE 1 TABLET BY MOUTH  TWICE DAILY 180 tablet 2  . atorvastatin (LIPITOR) 10 MG tablet Take 1 tablet (10 mg total) by mouth daily. 90 tablet 3  . Cholecalciferol (VITAMIN D3) 2000 units TABS Take 2,000 Units by mouth daily.     . citalopram (CELEXA) 20 MG tablet Take 1 tablet (20 mg total) by mouth daily. 90 tablet 3  . Ferrous Sulfate (IRON) 325 (65 Fe) MG TABS Take 1 tablet (325 mg total) by mouth every other day. Reported on 11/08/2015 30 each 0  . irbesartan (AVAPRO) 300 MG tablet TAKE 1 TABLET BY MOUTH  DAILY 90 tablet 3  . metoprolol tartrate (LOPRESSOR) 25 MG tablet TAKE 1 TABLET BY MOUTH  TWICE DAILY 180 tablet 2  . omeprazole  (PRILOSEC) 20 MG capsule TAKE 1 CAPSULE BY MOUTH  EVERY OTHER DAY 45 capsule 3  . OVER THE COUNTER MEDICATION Place 1 application into both eyes daily as needed (dry eyes/irritation). OTC eye gel    . SYNTHROID 50 MCG tablet TAKE 1 TABLET BY MOUTH  DAILY BEFORE BREAKFAST 90 tablet 3   No current facility-administered medications for this visit.     Past Medical History:  Diagnosis Date  . Allergic conjunctivitis   . Allergic rhinitis, cause unspecified    on allergy shots (Dr. Orvil Feil)  . Atherosclerosis of aorta (HCC)    noted on CT angio of abd/pelvis, in many vessels  . BPH (benign prostatic hypertrophy)   . Diverticulosis of colon 12/09  . Foot drop, right   . GERD (gastroesophageal reflux disease)   . Hearing loss in left ear    both ears now  . History of Clostridium difficile    multiple times 2016--s/p fecal transplant (Dr. Baxter Flattery).  Marland Kitchen Hx of echocardiogram    Echo (03/2014):  Mild LVH, EF 50-55%, no RWMA, Gr 1 DD, mild MR, mild reduced RVSF  . Hypertension   . Internal hemorrhoids   . Iron deficiency anemia, unspecified 6/09  . Mitral valve prolapse    h/o; normal echo 12/2011 with no MVP seen  . 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 Right ventricular dysfunction 01/23/2014   Secondary to obstruction of main pulmonary artery  . Ruptured thoracic aortic aneurysm ([place]) 01/23/2014  . S/P ascending aortic aneurysm repair 01/24/2014  . Unspecified hypothyroidism     Past Surgical History:  Procedure Laterality Date  . CATARACT  EXTRACTION, BILATERAL Bilateral april/may 2021  . CERVICAL LAMINECTOMY  1972   C5-6  . COLONOSCOPY WITH PROPOFOL N/A 11/09/2014   Procedure: COLONOSCOPY WITH PROPOFOL;  Surgeon: Jerene Bears, MD;  Location: [facility];  Service: Gastroenterology;  Laterality: N/A;  . ESOPHAGOGASTRODUODENOSCOPY  10/31/08   normal; Dr. Edison Nasuti  . FECAL TRANSPLANT N/A 11/09/2014   Procedure: FECAL TRANSPLANT;  Surgeon: Jerene Bears, MD;  Location: [facility];   Service: Gastroenterology;  Laterality: N/A;  . HEMORRHOIDECTOMY WITH HEMORRHOID BANDING  04/07/13  . hemorroidal banding  04/07/2013   x3-Dr.Eric Redmond Pulling  . PROSTATE SURGERY  2007   photovaporization  . ROTATOR CUFF REPAIR  10/2008   left; Dr. Onnie Graham  . SPINE SURGERY  2006   L4-5 disk surgery  . SPINE SURGERY  04/2013   L4-5, L5-S1 fusion.  Dr. Christella Noa  . THORACIC AORTIC ANEURYSM REPAIR N/A 01/23/2014   Procedure: THORACIC ASCENDING ANEURYSM REPAIR (AAA);  Surgeon: Rexene Alberts, MD;  Location: [facility];  Service: Open Heart Surgery;  Laterality: N/A;  . TONSILLECTOMY      Social History   Socioeconomic History  . Marital status: Married    Spouse name: Not on file  . Number of children: 2  . Years of education: Not on file  . Highest education level: Not on file  Occupational History  . Not on file  Tobacco Use  . Smoking status: Former Smoker    Types: Cigarettes    Quit date: 09/01/1977    Years since quitting: 43.2  . Smokeless tobacco: Never Used  Vaping Use  . Vaping Use: Never used  Substance and Sexual Activity  . Alcohol use: Yes    Comment: 2 glasses wine per day (small)  . Drug use: No  . Sexual activity: Not on file  Other Topics Concern  . Not on file  Social History Narrative   Lives with wife. 1 daughter in [place], 1 daughter in [place], [place].   Getting stairlift installed 01/2019   5 grandchildren.   Social Determinants of Health   Financial Resource Strain: Not on file  Food Insecurity: Not on file  Transportation Needs: Not on file  Physical Activity: Not on file  Stress: Not on file  Social Connections: Not on file  Intimate Partner Violence: Not on file    Family History  Problem Relation Age of Onset  . Diabetes Mother   . Heart disease Mother        CHF  . Hypertension Mother   . Depression Mother   . Stroke Father   . Parkinsonism Father   . Hypertension Father   . Cancer Brother        bladder cancer  . Cancer Brother         prostate cancer  . Emphysema Brother   . Diabetes Sister   . COPD Sister   . Colon cancer Neg Hx     ROS: no fevers or chills, productive cough, hemoptysis, dysphasia, odynophagia, melena, hematochezia, dysuria, hematuria, rash, seizure activity, orthopnea, PND, pedal edema, claudication. Remaining systems are negative.  Physical Exam: Well-developed well-nourished in no acute distress.  Skin is warm and dry.  HEENT is normal.  Neck is supple.  Chest is clear to auscultation with normal expansion.  Cardiovascular exam is regular rate and rhythm.  Abdominal exam nontender or distended. No masses palpated. Extremities show no edema. neuro grossly intact  ECG-normal sinus rhythm at a rate of 60, left ventricular hypertrophy, left anterior fascicular  block, cannot rule out septal infarct.  Personally reviewed  Chest x-ray October 15, 2020 showed chronic interstitial lung disease.  A/P  1 dyspnea-patient does not appear to be volume overloaded on examination.  We will recheck echocardiogram for LV function.  Note his chest x-ray shows interstitial lung disease and he may need pulmonary evaluation.  I will ask him to follow-up with primary care for this.  2 thoracic aortic aneurysm repair-most recent CTA showed stable repair.  3 hypertension-blood pressure controlled.  Continue present medications and follow.  4 hyperlipidemia-continue statin.  5 history of near syncope-previous monitor is unremarkable and he had no further symptoms.  Can consider implantable loop in the future if needed.  6 history of nontraumatic hemorrhagic CVA-felt secondary to uncontrolled hypertension.  Blood pressure is now controlled.  Kirk Ruths, MD

## 2020-12-16 ENCOUNTER — Other Ambulatory Visit: Payer: Self-pay | Admitting: Cardiology

## 2020-12-16 DIAGNOSIS — I10 Essential (primary) hypertension: Secondary | ICD-10-CM

## 2020-12-17 NOTE — Telephone Encounter (Signed)
Rx has been sent to the pharmacy electronically. ° °

## 2021-01-14 ENCOUNTER — Other Ambulatory Visit: Payer: Self-pay

## 2021-01-14 ENCOUNTER — Ambulatory Visit (HOSPITAL_COMMUNITY): Payer: Medicare Other | Attending: Cardiology

## 2021-01-14 DIAGNOSIS — R0602 Shortness of breath: Secondary | ICD-10-CM | POA: Diagnosis not present

## 2021-01-14 LAB — ECHOCARDIOGRAM COMPLETE
Area-P 1/2: 2.99 cm2
S' Lateral: 3.1 cm

## 2021-01-16 ENCOUNTER — Other Ambulatory Visit: Payer: Self-pay | Admitting: Family Medicine

## 2021-01-16 DIAGNOSIS — E7800 Pure hypercholesterolemia, unspecified: Secondary | ICD-10-CM

## 2021-01-16 DIAGNOSIS — I700 Atherosclerosis of aorta: Secondary | ICD-10-CM

## 2021-01-18 ENCOUNTER — Other Ambulatory Visit: Payer: Self-pay

## 2021-01-18 ENCOUNTER — Ambulatory Visit (INDEPENDENT_AMBULATORY_CARE_PROVIDER_SITE_OTHER): Payer: Medicare Other | Admitting: Podiatry

## 2021-01-18 DIAGNOSIS — M79674 Pain in right toe(s): Secondary | ICD-10-CM

## 2021-01-18 DIAGNOSIS — M79675 Pain in left toe(s): Secondary | ICD-10-CM

## 2021-01-18 DIAGNOSIS — M79672 Pain in left foot: Secondary | ICD-10-CM | POA: Diagnosis not present

## 2021-01-18 DIAGNOSIS — B351 Tinea unguium: Secondary | ICD-10-CM | POA: Diagnosis not present

## 2021-01-18 DIAGNOSIS — Q828 Other specified congenital malformations of skin: Secondary | ICD-10-CM

## 2021-01-21 ENCOUNTER — Encounter: Payer: Self-pay | Admitting: Podiatry

## 2021-01-21 NOTE — Progress Notes (Signed)
  Subjective:  Patient ID: Danny Frederick, male    DOB: [birth year],  MRN: [id number]  Danny Frederick presents to clinic today for painful porokeratotic lesion(s) left foot and painful mycotic toenails that limit ambulation. Painful toenails interfere with ambulation. Aggravating factors include wearing enclosed shoe gear. Pain is relieved with periodic professional debridement. Painful porokeratotic lesions are aggravated when weightbearing with and without shoegear. Pain is relieved with periodic professional debridement.  No Known Allergies  Review of Systems: Negative except as noted in the HPI. Objective:   Constitutional Danny Frederick is a pleasant 87 y.o. Caucasian male, WD, WN in NAD. AAO x 3.   Vascular Capillary refill time to digits immediate b/l. Palpable pedal pulses b/l LE. Pedal hair sparse. Lower extremity skin temperature gradient within normal limits. No pain with calf compression b/l. No edema noted b/l lower extremities. No cyanosis or clubbing noted.  Neurologic Normal speech. Oriented to person, place, and time. Protective sensation intact 5/5 intact bilaterally with 10g monofilament b/l. Vibratory sensation intact b/l.  Dermatologic Pedal skin with normal turgor, texture and tone bilaterally. No open wounds bilaterally. No interdigital macerations bilaterally. Toenails 1-5 b/l elongated, discolored, dystrophic, thickened, crumbly with subungual debris and tenderness to dorsal palpation. Porokeratotic lesion(s) submet head 4 left foot. No erythema, no edema, no drainage, no fluctuance.  Orthopedic: Normal muscle strength 5/5 to all lower extremity muscle groups bilaterally. No pain crepitus or joint limitation noted with ROM b/l. No gross bony deformities bilaterally. Patient ambulates independent of any assistive aids.   Radiographs: None Assessment:   1. Pain due to onychomycosis of toenails of both feet   2. Porokeratosis   3. Left foot pain    Plan:  Patient was  evaluated and treated and all questions answered.  Onychomycosis with pain -Nails palliatively debridement as below -Educated on self-care  Procedure: Nail Debridement Rationale: Pain Type of Debridement: manual, sharp debridement. Instrumentation: Nail nipper, rotary burr. Number of Nails: 10 -Examined patient. -Medicare ABN signed for this year. Patient consents for services of paring of porokeratotic lesion left foot  today. Copy has been placed in patient's chart. -Patient to continue soft, supportive shoe gear daily. -Toenails 1-5 b/l were debrided in length and girth with sterile nail nippers and dremel without iatrogenic bleeding.  -Patient to report any pedal injuries to medical professional immediately. -Patient/POA to call should there be question/concern in the interim.  Return in about 3 months (around 04/20/2021).  Marzetta Board, DPM

## 2021-01-31 ENCOUNTER — Encounter: Payer: Self-pay | Admitting: Family Medicine

## 2021-01-31 ENCOUNTER — Other Ambulatory Visit: Payer: Self-pay

## 2021-01-31 ENCOUNTER — Ambulatory Visit (INDEPENDENT_AMBULATORY_CARE_PROVIDER_SITE_OTHER): Payer: Medicare Other | Admitting: Family Medicine

## 2021-01-31 VITALS — BP 116/68 | HR 66 | Temp 98.0°F | Wt 134.4 lb

## 2021-01-31 DIAGNOSIS — M545 Low back pain, unspecified: Secondary | ICD-10-CM

## 2021-01-31 DIAGNOSIS — R413 Other amnesia: Secondary | ICD-10-CM | POA: Diagnosis not present

## 2021-01-31 DIAGNOSIS — S5012XA Contusion of left forearm, initial encounter: Secondary | ICD-10-CM | POA: Diagnosis not present

## 2021-01-31 DIAGNOSIS — R6889 Other general symptoms and signs: Secondary | ICD-10-CM

## 2021-01-31 DIAGNOSIS — R7309 Other abnormal glucose: Secondary | ICD-10-CM | POA: Diagnosis not present

## 2021-01-31 LAB — POCT URINALYSIS DIP (PROADVANTAGE DEVICE)
Bilirubin, UA: NEGATIVE
Blood, UA: NEGATIVE
Glucose, UA: NEGATIVE mg/dL
Ketones, POC UA: NEGATIVE mg/dL
Leukocytes, UA: NEGATIVE
Nitrite, UA: NEGATIVE
Specific Gravity, Urine: 1.015
Urobilinogen, Ur: 0.2
pH, UA: 6

## 2021-01-31 NOTE — Progress Notes (Signed)
   Subjective:    Patient ID: Danny Frederick, male    DOB: Dec 16, 1933, 87 y.o.   MRN: [id number]  HPI He is brought in by his daughter who states that she has noted a change in his mental status becoming more forgetful over the last several weeks.  He lives with his wife who has Alzheimer's disease.  They also have caregivers coming in that they are planning on having him come in more often to help with their care. Apparently he did fall last Saturday but does not remember the fall.  He is not complaining of weakness, numbness, tingling, headache.  Review of Systems     Objective:   Physical Exam Alert and in no distress.  Exam of the scalp shows no obvious lesions.  No tenderness over his neck, chest and abdomen.  Left forearm does have an abrasion and contusion.  Cardiac exam shows regular rhythm without murmurs or gallops.  Lungs are clear to auscultation.  Moves all extremities.       Assessment & Plan:  Forgetfulness - Plan: CBC with Differential/Platelet, Comprehensive metabolic panel  Low back pain, unspecified back pain laterality, unspecified chronicity, unspecified whether sciatica present - Plan: POCT Urinalysis DIP (Proadvantage Device)  Memory loss - Plan: CBC with Differential/Platelet, Comprehensive metabolic panel  Contusion of left forearm, initial encounter 's exam was essentially negative.  He does state that he fell but apparently is not sure whether he had a head injury.  That might need to be evaluated in the near future if he continues have difficulty.  This was explained to his daughter and she was comfortable with that.

## 2021-02-01 LAB — CBC WITH DIFFERENTIAL/PLATELET
Basophils Absolute: 0.1 10*3/uL (ref 0.0–0.2)
Basos: 1 %
EOS (ABSOLUTE): 0.2 10*3/uL (ref 0.0–0.4)
Eos: 3 %
Hematocrit: 41.3 % (ref 37.5–51.0)
Hemoglobin: 13.4 g/dL (ref 13.0–17.7)
Immature Grans (Abs): 0 10*3/uL (ref 0.0–0.1)
Immature Granulocytes: 0 %
Lymphocytes Absolute: 1.8 10*3/uL (ref 0.7–3.1)
Lymphs: 25 %
MCH: 28.4 pg (ref 26.6–33.0)
MCHC: 32.4 g/dL (ref 31.5–35.7)
MCV: 88 fL (ref 79–97)
Monocytes Absolute: 0.8 10*3/uL (ref 0.1–0.9)
Monocytes: 12 %
Neutrophils Absolute: 4.3 10*3/uL (ref 1.4–7.0)
Neutrophils: 59 %
Platelets: 380 10*3/uL (ref 150–450)
RBC: 4.72 x10E6/uL (ref 4.14–5.80)
RDW: 13 % (ref 11.6–15.4)
WBC: 7.2 10*3/uL (ref 3.4–10.8)

## 2021-02-01 LAB — COMPREHENSIVE METABOLIC PANEL
ALT: 11 IU/L (ref 0–44)
AST: 22 IU/L (ref 0–40)
Albumin/Globulin Ratio: 1.1 — ABNORMAL LOW (ref 1.2–2.2)
Albumin: 4.1 g/dL (ref 3.6–4.6)
Alkaline Phosphatase: 85 IU/L (ref 44–121)
BUN/Creatinine Ratio: 17 (ref 10–24)
BUN: 19 mg/dL (ref 8–27)
Bilirubin Total: 0.8 mg/dL (ref 0.0–1.2)
CO2: 23 mmol/L (ref 20–29)
Calcium: 9.7 mg/dL (ref 8.6–10.2)
Chloride: 97 mmol/L (ref 96–106)
Creatinine, Ser: 1.1 mg/dL (ref 0.76–1.27)
Globulin, Total: 3.7 g/dL (ref 1.5–4.5)
Glucose: 129 mg/dL — ABNORMAL HIGH (ref 65–99)
Potassium: 5.1 mmol/L (ref 3.5–5.2)
Sodium: 134 mmol/L (ref 134–144)
Total Protein: 7.8 g/dL (ref 6.0–8.5)
eGFR: 66 mL/min/{1.73_m2} (ref >59)

## 2021-02-04 LAB — SPECIMEN STATUS REPORT

## 2021-02-04 LAB — HGB A1C W/O EAG: Hgb A1c MFr Bld: 5.9 % — ABNORMAL HIGH (ref 4.8–5.6)

## 2021-02-06 ENCOUNTER — Other Ambulatory Visit: Payer: Self-pay | Admitting: Family Medicine

## 2021-02-06 DIAGNOSIS — E039 Hypothyroidism, unspecified: Secondary | ICD-10-CM

## 2021-03-15 ENCOUNTER — Telehealth: Payer: Self-pay | Admitting: Family Medicine

## 2021-03-15 DIAGNOSIS — U071 COVID-19: Secondary | ICD-10-CM

## 2021-03-15 MED ORDER — MOLNUPIRAVIR EUA 200MG CAPSULE: 4.0000 | ORAL_CAPSULE | Freq: Two times a day (BID) | ORAL | 0 refills | Status: AC

## 2021-03-15 NOTE — Telephone Encounter (Signed)
Need to know if he has symptoms.  We do not use medications to treat if asymptomatic. He was not symptomatic at time of our visit the other day.  If symptomatic, I will send the rx to his pharmacy

## 2021-03-15 NOTE — Telephone Encounter (Signed)
Pt's daughter called and states that pt has now tested positive for COVID . She would like the same medication sent in for mother sent for father. Pt uses [facility] and daughter can be reached at [phone].

## 2021-03-15 NOTE — Telephone Encounter (Signed)
Rx sent 

## 2021-04-02 ENCOUNTER — Emergency Department (HOSPITAL_COMMUNITY): Payer: Medicare Other

## 2021-04-02 ENCOUNTER — Emergency Department (HOSPITAL_COMMUNITY)
Admission: EM | Admit: 2021-04-02 | Discharge: 2021-04-03 | Disposition: A | Discharge status: Home / Self Care | Payer: Medicare Other | Attending: Emergency Medicine | Admitting: Emergency Medicine

## 2021-04-02 ENCOUNTER — Encounter (HOSPITAL_COMMUNITY): Payer: Self-pay | Admitting: Emergency Medicine

## 2021-04-02 DIAGNOSIS — M25512 Pain in left shoulder: Secondary | ICD-10-CM | POA: Diagnosis not present

## 2021-04-02 DIAGNOSIS — S4992XA Unspecified injury of left shoulder and upper arm, initial encounter: Secondary | ICD-10-CM | POA: Diagnosis present

## 2021-04-02 DIAGNOSIS — W19XXXA Unspecified fall, initial encounter: Secondary | ICD-10-CM | POA: Insufficient documentation

## 2021-04-02 DIAGNOSIS — I440 Atrioventricular block, first degree: Secondary | ICD-10-CM | POA: Diagnosis not present

## 2021-04-02 DIAGNOSIS — Z79899 Other long term (current) drug therapy: Secondary | ICD-10-CM | POA: Diagnosis not present

## 2021-04-02 DIAGNOSIS — I10 Essential (primary) hypertension: Secondary | ICD-10-CM | POA: Insufficient documentation

## 2021-04-02 DIAGNOSIS — Y92009 Unspecified place in unspecified non-institutional (private) residence as the place of occurrence of the external cause: Secondary | ICD-10-CM | POA: Insufficient documentation

## 2021-04-02 DIAGNOSIS — S43015A Anterior dislocation of left humerus, initial encounter: Secondary | ICD-10-CM | POA: Diagnosis not present

## 2021-04-02 DIAGNOSIS — R61 Generalized hyperhidrosis: Secondary | ICD-10-CM | POA: Diagnosis not present

## 2021-04-02 DIAGNOSIS — S43005A Unspecified dislocation of left shoulder joint, initial encounter: Secondary | ICD-10-CM | POA: Diagnosis not present

## 2021-04-02 DIAGNOSIS — E039 Hypothyroidism, unspecified: Secondary | ICD-10-CM | POA: Insufficient documentation

## 2021-04-02 DIAGNOSIS — Z87891 Personal history of nicotine dependence: Secondary | ICD-10-CM | POA: Insufficient documentation

## 2021-04-02 DIAGNOSIS — R0689 Other abnormalities of breathing: Secondary | ICD-10-CM | POA: Diagnosis not present

## 2021-04-02 DIAGNOSIS — M25519 Pain in unspecified shoulder: Secondary | ICD-10-CM | POA: Diagnosis not present

## 2021-04-02 DIAGNOSIS — R0902 Hypoxemia: Secondary | ICD-10-CM | POA: Diagnosis not present

## 2021-04-02 NOTE — ED Notes (Signed)
Daughter updated 

## 2021-04-02 NOTE — Discharge Instructions (Addendum)
Please follow-up with the orthopedic doctor in 7 to 14 days.  Keep the arm in the sling until seen by orthopedist.  You can carry on with general range of motion exercises while in the sling to ensure you do not lose mobility.

## 2021-04-02 NOTE — ED Provider Notes (Signed)
[facility] DEPT Provider Note   CSN: [id number] Arrival date & time: 04/02/21  2105     History Chief Complaint  Patient presents with   Fall   Shoulder Pain    Danny Frederick is a 87 y.o. male.  HPI     87 year old male comes in with chief complaint of fall and shoulder pain.  Patient lives at his home with his wife.  He reports that he has had some balance issues due to stroke and thinks that he fell because of it earlier today.  He is quite sure that he did not pass out.  After the fall he started having pain in the shoulder.   Patient denies any headaches, neck pain. Pt has no nausea, vomiting, seizures, loss of consciousness or new visual complains, weakness, numbness, dizziness or gait instability.  He is not on any blood thinners.   Past Medical History:  Diagnosis Date   Allergic conjunctivitis    Allergic rhinitis, cause unspecified    on allergy shots (Dr. Orvil Feil)   Atherosclerosis of aorta [facility])    noted on CT angio of abd/pelvis, in many vessels   BPH (benign prostatic hypertrophy)    Diverticulosis of colon 12/09   Foot drop, right    GERD (gastroesophageal reflux disease)    Hearing loss in left ear    both ears now   History of Clostridium difficile    multiple times 2016--s/p fecal transplant (Dr. Baxter Flattery).   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   Right ventricular dysfunction 01/23/2014   Secondary to obstruction of main pulmonary artery   Ruptured thoracic aortic aneurysm ([place]) 01/23/2014   S/P ascending aortic aneurysm repair 01/24/2014   Unspecified hypothyroidism     Patient Active Problem List   Diagnosis Date Noted   DNR (do not resuscitate)  10/06/2019   Hyperlipidemia 12/13/2018   Family hx-stroke 12/13/2018   ICH (intracerebral hemorrhage) ([place]) - L BG 12/11/2018   Lip laceration 12/07/2018   Impaired fasting glucose 12/06/2018   Rotator cuff tear arthropathy 05/04/2018   Spinal stenosis of lumbar region 09/08/2017   Atherosclerosis of aorta ([place]) 11/08/2015   Recurrent Clostridium difficile diarrhea    Bacteremia    S/P ascending aortic aneurysm repair 01/24/2014   Ruptured thoracic aortic aneurysm ([place]) 01/23/2014   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   Right ventricular dysfunction 01/23/2014   Solitary pulmonary nodule 12/08/2013   Chest pain 12/02/2013   Allergic conjunctivitis 10/20/2012   Syncope 01/22/2012   Hypothyroidism 01/22/2011   Essential hypertension, benign 01/22/2011   GERD (gastroesophageal reflux disease) 01/22/2011   Iron deficiency anemia 01/22/2011   IRON DEFICIENCY ANEMIA SECONDARY TO BLOOD LOSS 11/08/2008   ANEMIA 05/26/2008   Essential hypertension 03/02/2008   DYSPNEA 03/02/2008    Past Surgical History:  Procedure Laterality Date   CATARACT EXTRACTION, BILATERAL Bilateral april/may 2021   CERVICAL LAMINECTOMY  1972   C5-6   COLONOSCOPY WITH PROPOFOL N/A 11/09/2014   Procedure: COLONOSCOPY WITH PROPOFOL;  Surgeon: Jerene Bears, MD;  Location: [facility];  Service: Gastroenterology;  Laterality: N/A;   ESOPHAGOGASTRODUODENOSCOPY  10/31/08   normal; Dr. Bebe Liter  TRANSPLANT N/A 11/09/2014   Procedure: FECAL TRANSPLANT;  Surgeon: Jerene Bears, MD;  Location: [facility];  Service: Gastroenterology;  Laterality: N/A;   HEMORRHOIDECTOMY WITH HEMORRHOID BANDING  04/07/13   hemorroidal banding  04/07/2013   x3-Dr.Eric Redmond Pulling   PROSTATE SURGERY  2007   photovaporization   ROTATOR CUFF REPAIR  10/2008   left; Dr. Onnie Graham   SPINE SURGERY  2006   L4-5 disk surgery   SPINE SURGERY  04/2013   L4-5, L5-S1 fusion.  Dr. Christella Noa   THORACIC AORTIC ANEURYSM REPAIR N/A 01/23/2014    Procedure: [place] (AAA);  Surgeon: Rexene Alberts, MD;  Location: [facility];  Service: Open Heart Surgery;  Laterality: N/A;   TONSILLECTOMY         Family History  Problem Relation Age of Onset   Diabetes Mother    Heart disease Mother        CHF   Hypertension Mother    Depression Mother    Stroke Father    Parkinsonism Father    Hypertension Father    Cancer Brother        bladder cancer   Cancer Brother        prostate cancer   Emphysema Brother    Diabetes Sister    COPD Sister    Colon cancer Neg Hx     Social History   Tobacco Use   Smoking status: Former    Types: Cigarettes    Quit date: 09/01/1977    Years since quitting: 43.6   Smokeless tobacco: Never  Vaping Use   Vaping Use: Never used  Substance Use Topics   Alcohol use: Yes    Comment: 2 glasses wine per day (small)   Drug use: No    Home Medications Prior to Admission medications   Medication Sig Start Date End Date Taking? Authorizing Provider  amLODipine (NORVASC) 5 MG tablet TAKE 1 TABLET BY MOUTH  TWICE DAILY 12/17/20   Lelon Perla, MD  atorvastatin (LIPITOR) 10 MG tablet TAKE 1 TABLET BY MOUTH  DAILY 01/16/21   Rita Ohara, MD  Cholecalciferol (VITAMIN D3) 2000 units TABS Take 2,000 Units by mouth daily.     [provider]  citalopram (CELEXA) 20 MG tablet Take 1 tablet (20 mg total) by mouth daily. 03/12/20   Rita Ohara, MD  Ferrous Sulfate (IRON) 325 (65 Fe) MG TABS Take 1 tablet (325 mg total) by mouth every other day. Reported on 11/08/2015 12/28/18   Angiulli, Lavon Paganini, PA-C  irbesartan (AVAPRO) 300 MG tablet TAKE 1 TABLET BY MOUTH  DAILY 07/27/20   Lelon Perla, MD  metoprolol tartrate (LOPRESSOR) 25 MG tablet TAKE 1 TABLET BY MOUTH  TWICE DAILY 03/26/20   Lelon Perla, MD  omeprazole (PRILOSEC) 20 MG capsule TAKE 1 CAPSULE BY MOUTH  EVERY OTHER DAY 03/12/20   Rita Ohara, MD  OVER THE COUNTER MEDICATION Place 1 application into both eyes daily as  needed (dry eyes/irritation). OTC eye gel    [provider]  SYNTHROID 50 MCG tablet TAKE 1 TABLET BY MOUTH  DAILY BEFORE BREAKFAST 02/06/21   Rita Ohara, MD    Allergies    Patient has no known allergies.  Review of Systems   Review of Systems  Constitutional:  Positive for activity change.  Musculoskeletal:  Positive for arthralgias.  Neurological:  Negative for headaches.  Hematological:  Does not bruise/bleed easily.   Physical Exam Updated Vital Signs BP (!) 122/93  Pulse 63   Temp 98 F (36.7 C)   Resp 20   SpO2 96%   Physical Exam Vitals and nursing note reviewed.  Constitutional:      Appearance: He is well-developed.  HENT:     Head: Atraumatic.  Neck:     Comments: No midline c-spine tenderness, pt able to turn head to 45 degrees bilaterally without any pain and able to flex neck to the chest and extend without any pain or neurologic symptoms.  Cardiovascular:     Rate and Rhythm: Normal rate.  Pulmonary:     Effort: Pulmonary effort is normal.  Musculoskeletal:        General: Tenderness, deformity and signs of injury present.     Cervical back: Neck supple.     Comments: Swelling with deformity of the left shoulder  Skin:    General: Skin is warm.  Neurological:     Mental Status: He is alert and oriented to person, place, and time.    ED Results / Procedures / Treatments   Labs (all labs ordered are listed, but only abnormal results are displayed) Labs Reviewed - No data to display  EKG None  Radiology DG Shoulder Left  Result Date: 04/02/2021 CLINICAL DATA:  Reduction of left shoulder dislocation EXAM: LEFT SHOULDER - 2+ VIEW COMPARISON:  04/02/2021 FINDINGS: Frontal and transscapular views of the left shoulder demonstrate anatomic alignment of the glenohumeral joint. There is complete obliteration of the acromial humeral interval consistent with chronic longstanding rotator cuff tear. Spurring of the acromion process is noted. No acute  displaced fracture. Left chest is clear. IMPRESSION: 1. Reduction of prior dislocation. 2. Evidence of chronic longstanding rotator cuff tear, with loss of acromial humeral interval. Electronically Signed   By: Randa Ngo M.D.   On: 04/02/2021 22:44   DG Shoulder Left  Result Date: 04/02/2021 CLINICAL DATA:  Fall with left shoulder pain.  Deformity. EXAM: LEFT SHOULDER - 2+ VIEW COMPARISON:  None. FINDINGS: Anterior dislocation of the humeral head with respect to the glenoid. Suspected Hill-Sachs impaction injury to the lateral humeral head. No obvious bony [place]. Minimal acromioclavicular degenerative change. IMPRESSION: Anterior dislocation of the humeral head with respect to the glenoid. Suspected Hill-Sachs impaction injury to the lateral humeral head. Electronically Signed   By: Keith Rake M.D.   On: 04/02/2021 21:41    Procedures Reduction of dislocation  Date/Time: 04/02/2021 11:51 PM Performed by: Varney Biles, MD Authorized by: Varney Biles, MD  Consent: Verbal consent obtained. Risks and benefits: risks, benefits and alternatives were discussed Consent given by: patient Patient identity confirmed: arm band Time out: Immediately prior to procedure a "time out" was called to verify the correct patient, procedure, equipment, support staff and site/side marked as required. Preparation: Patient was prepped and draped in the usual sterile fashion. Local anesthesia used: no  Anesthesia: Local anesthesia used: no  Sedation: Patient sedated: no  Patient tolerance: patient tolerated the procedure well with no immediate complications     Medications Ordered in ED Medications - No data to display  ED Course  I have reviewed the triage vital signs and the nursing notes.  Pertinent labs & imaging results that were available during my care of the patient were reviewed by me and considered in my medical decision making (see chart for details).    MDM  Rules/Calculators/A&P  87 year old male comes in with chief complaint of fall.  As result of his fall, he has left shoulder dislocation.  Shoulder was reduced with varus technique without requiring sedation.    Patient is sure that he did not pass out.  He thinks that the fall was mechanical.  Patient has no headache, no red flags for elevated ICP.   Patient reassessed at 1140.  He is still not having any headaches, nausea, vision change and he is comfortable going home at this time.  The patient appears reasonably screened and/or stabilized for discharge and I doubt any other medical condition or other [facility] requiring further screening, evaluation, or treatment in the ED at this time prior to discharge.   Results from the ER workup discussed with the patient face to face and all questions answered to the best of my ability. The patient is safe for discharge with strict return precautions.   Final Clinical Impression(s) / ED Diagnoses Final diagnoses:  Shoulder dislocation, left, initial encounter    Rx / DC Orders ED Discharge Orders     None        Varney Biles, MD 04/02/21 2352

## 2021-04-02 NOTE — ED Triage Notes (Signed)
Pt BIB EMS from home c/o left shoulder pain after witnessed mechanical fall at home. Denies hitting head and LOC. Not on blood thinners.

## 2021-04-02 NOTE — ED Notes (Signed)
Patient transported to X-ray 

## 2021-04-02 NOTE — ED Notes (Signed)
Reduction performed in room with no sedation by MD nanavati, RN, and Tech

## 2021-04-03 ENCOUNTER — Telehealth: Payer: Self-pay | Admitting: Family Medicine

## 2021-04-03 NOTE — Telephone Encounter (Signed)
OV please

## 2021-04-03 NOTE — Telephone Encounter (Signed)
Pt's daughter called and asked if I had FL2 forms. I informed her I did and she asked me to send those back to Dr. Tomi Bamberger to be completed.

## 2021-04-04 ENCOUNTER — Other Ambulatory Visit: Payer: Self-pay | Admitting: Family Medicine

## 2021-04-04 ENCOUNTER — Ambulatory Visit (INDEPENDENT_AMBULATORY_CARE_PROVIDER_SITE_OTHER): Payer: Medicare Other | Admitting: Family Medicine

## 2021-04-04 ENCOUNTER — Other Ambulatory Visit: Payer: Self-pay

## 2021-04-04 ENCOUNTER — Encounter: Payer: Self-pay | Admitting: Family Medicine

## 2021-04-04 VITALS — BP 124/64 | HR 60 | Ht 62.0 in | Wt 135.0 lb

## 2021-04-04 DIAGNOSIS — R413 Other amnesia: Secondary | ICD-10-CM

## 2021-04-04 DIAGNOSIS — S43005A Unspecified dislocation of left shoulder joint, initial encounter: Secondary | ICD-10-CM

## 2021-04-04 DIAGNOSIS — Z111 Encounter for screening for respiratory tuberculosis: Secondary | ICD-10-CM | POA: Diagnosis not present

## 2021-04-04 DIAGNOSIS — I10 Essential (primary) hypertension: Secondary | ICD-10-CM | POA: Diagnosis not present

## 2021-04-04 DIAGNOSIS — W19XXXD Unspecified fall, subsequent encounter: Secondary | ICD-10-CM

## 2021-04-04 NOTE — Progress Notes (Signed)
Chief Complaint  Patient presents with   Consult    Consult for [facility] paperwork for [facility]    Patient presents for ER follow-up, and for form completion. He fell in his home on 8/2, and dislocated his L shoulder.  He doesn't recall the details of his fall.  No known LOC or head injury. Ivin Booty accompanies the patient and his wife today. She was very upset about the ER visit--he apparently soiled himself (BM) while in the ER, was told that it was cleaned up, when it wasn't, and she didn't realize until she took her mask off when already out of the building.  She had great difficulty trying to get him cleaned up--patient wasn't strong enough to stand that long, she ended up having to call the paramedics to come and help her get him up.  They report loss of movement of L arm since shoulder relocation in the ER. Patient is very anxious/distraught about this, though denies any pain. He denies any numbness or weakness, just can't move the arm away from his body.  He is wearing a sling. The shoulder had been dislocated for a few hours  "We are incapable of taking care of ourselves." He is in complete agreement with moving to assisted living. Daughter is very concerned about how he will manage at home until placement occurs--with his L arm in a sling, he can't dress himself, cut his food, can't use his walker, so having difficulty getting around.  He has had some more  memory issues in the last year, but has still been managing his medications fine.  PMH, PSH, SH reviewed He had COVID last month, fully recovered.  Outpatient Encounter Medications as of 04/04/2021  Medication Sig Note   amLODipine (NORVASC) 5 MG tablet TAKE 1 TABLET BY MOUTH  TWICE DAILY    atorvastatin (LIPITOR) 10 MG tablet TAKE 1 TABLET BY MOUTH  DAILY    Cholecalciferol (VITAMIN D3) 2000 units TABS Take 2,000 Units by mouth daily.     citalopram (CELEXA) 20 MG tablet Take 1 tablet (20 mg total) by mouth daily.     Ferrous Sulfate (IRON) 325 (65 Fe) MG TABS Take 1 tablet (325 mg total) by mouth every other day. Reported on 11/08/2015    irbesartan (AVAPRO) 300 MG tablet TAKE 1 TABLET BY MOUTH  DAILY    metoprolol tartrate (LOPRESSOR) 25 MG tablet TAKE 1 TABLET BY MOUTH  TWICE DAILY    omeprazole (PRILOSEC) 20 MG capsule TAKE 1 CAPSULE BY MOUTH  EVERY OTHER DAY    SYNTHROID 50 MCG tablet TAKE 1 TABLET BY MOUTH  DAILY BEFORE BREAKFAST    OVER THE COUNTER MEDICATION Place 1 application into both eyes daily as needed (dry eyes/irritation). OTC eye gel (Patient not taking: Reported on 04/04/2021) 08/04/2019: occasionally   No facility-administered encounter medications on file as of 04/04/2021.   No Known Allergies  ROS: Denies fever, chills, URI symptoms, headaches, dizziness, chest pain.  Denies nausea, vomiting, bowel changes (sometimes has a little more urgency, has had some accidents; no blood in stool), urinary complaints, bleeding, bruising, rash. Has been more forgetful, and has had some falls. Denies depression, on meds, and moods ok. See HPI   PHYSICAL EXAM:  BP 124/64   Pulse 60   Ht '5\' 2"'$  (1.575 m)   Wt 135 lb (61.2 kg) Comment: per patient  BMI 24.69 kg/m   Elderly male, alert and oriented. He is breathing somewhat loudly when at rest, but speaks  normally, in no distress HEENT: conjunctiva and sclera are clear, EOMI. Head is atraumatic Neck: no lymphadenopathy, thyromegaly or spinal tenderness. Heart: regular rate and rhythm Lungs: clear Extremities:  LUE is in a sling--worn improperly, with arm at his side. He is unable to lift his arm.  He has normal sensation and strength in hand. Shoulder is nontender. No clavicular tenderness or deformity.  Sling was repositioned to proper position. He is unsteady on his feet, somewhat shuffling (loss of arm use for stability is a factor).  He is in a wheelchair today, but witnessed transfer from chair.  ASSESSMENT/PLAN:  Shoulder dislocation, left,  initial encounter - treated in ER 8/2.  Wearing sling improperly, fixed.  He is very concerned about lack of movement/strength. Tried to address questions; ortho f/u - Plan: AMB referral to orthopedics  Screening for tuberculosis - Plan: QuantiFERON-TB Gold Plus  Memory loss - becoming more forgetful, has had some falls, can't remember.  Managing his meds. Assisted Living is appropriate. FL-2 filled out  Essential hypertension - well controlled  FL-2 form filled out for [facility]. Referral placed earlier today to [facility] for SW to help with placement and care--needs in-home care until moves to [place], and needs help with daily activities/self-care.  Daughter has some in-home help, but needs more.  Referral placed for ER f/u for dislocated shoulder--reassured that has normal pulse, sensation/strength. Discussed using splint to immobilize, and that he will need PT.  All questions answered.  I spent 42 minutes dedicated to the care of this patient, including pre-visit review of records, face to face time, post-visit ordering of testing and documentation.

## 2021-04-09 ENCOUNTER — Other Ambulatory Visit: Payer: Self-pay | Admitting: *Deleted

## 2021-04-09 NOTE — Patient Outreach (Signed)
[facility]) Care Management  04/09/2021  Danny Frederick May 24, 1934 [id number]   Referral Date: 8/4 Referral Source: MD office Referral Reason: Community Support Resources placement/assisted living  Insurance:   Outreach attempt #1, successful to [name].  Identity verified.  This care manager introduced self and stated purpose of call.  [facility] care management services explained.  She report both member and his wife will be placed at [facility] [place], moving in on Monday.  State she now has things handled, denies needing any further assistance.  Agrees to contact this care manager if move in date is delayed and assistance is needed.  Plan: RN CM will send outreach letter to daughter with this care manager's contact information.  Will close case at this time, daughter will call if needs change.  Valente David, [place], MSN [place] [phone]

## 2021-04-12 ENCOUNTER — Ambulatory Visit: Payer: Medicare Other | Admitting: Surgical

## 2021-04-12 LAB — QUANTIFERON-TB GOLD PLUS
QuantiFERON Mitogen Value: 8.66 IU/mL
QuantiFERON Nil Value: 0.16 IU/mL
QuantiFERON TB1 Ag Value: 0.41 IU/mL
QuantiFERON TB2 Ag Value: 0.42 IU/mL
QuantiFERON-TB Gold Plus: NEGATIVE

## 2021-04-15 ENCOUNTER — Encounter: Payer: Medicare Other | Admitting: Family Medicine

## 2021-04-18 DIAGNOSIS — F0390 Unspecified dementia without behavioral disturbance: Secondary | ICD-10-CM | POA: Diagnosis not present

## 2021-04-18 DIAGNOSIS — I10 Essential (primary) hypertension: Secondary | ICD-10-CM | POA: Diagnosis not present

## 2021-04-18 DIAGNOSIS — E559 Vitamin D deficiency, unspecified: Secondary | ICD-10-CM | POA: Diagnosis not present

## 2021-04-18 DIAGNOSIS — E785 Hyperlipidemia, unspecified: Secondary | ICD-10-CM | POA: Diagnosis not present

## 2021-04-20 ENCOUNTER — Other Ambulatory Visit: Payer: Self-pay | Admitting: Family Medicine

## 2021-04-20 DIAGNOSIS — E7800 Pure hypercholesterolemia, unspecified: Secondary | ICD-10-CM

## 2021-04-20 DIAGNOSIS — I700 Atherosclerosis of aorta: Secondary | ICD-10-CM

## 2021-04-22 ENCOUNTER — Ambulatory Visit: Payer: Medicare Other | Admitting: *Deleted

## 2021-04-22 DIAGNOSIS — E039 Hypothyroidism, unspecified: Secondary | ICD-10-CM | POA: Diagnosis not present

## 2021-04-22 DIAGNOSIS — E785 Hyperlipidemia, unspecified: Secondary | ICD-10-CM | POA: Diagnosis not present

## 2021-04-22 DIAGNOSIS — K219 Gastro-esophageal reflux disease without esophagitis: Secondary | ICD-10-CM | POA: Diagnosis not present

## 2021-04-22 DIAGNOSIS — F0390 Unspecified dementia without behavioral disturbance: Secondary | ICD-10-CM | POA: Diagnosis not present

## 2021-04-22 DIAGNOSIS — Z9181 History of falling: Secondary | ICD-10-CM | POA: Diagnosis not present

## 2021-04-22 DIAGNOSIS — I10 Essential (primary) hypertension: Secondary | ICD-10-CM | POA: Diagnosis not present

## 2021-04-22 DIAGNOSIS — S43005D Unspecified dislocation of left shoulder joint, subsequent encounter: Secondary | ICD-10-CM | POA: Diagnosis not present

## 2021-04-22 DIAGNOSIS — Z87891 Personal history of nicotine dependence: Secondary | ICD-10-CM | POA: Diagnosis not present

## 2021-04-22 DIAGNOSIS — E559 Vitamin D deficiency, unspecified: Secondary | ICD-10-CM | POA: Diagnosis not present

## 2021-04-22 DIAGNOSIS — D649 Anemia, unspecified: Secondary | ICD-10-CM | POA: Diagnosis not present

## 2021-04-22 DIAGNOSIS — F32A Depression, unspecified: Secondary | ICD-10-CM | POA: Diagnosis not present

## 2021-04-22 DIAGNOSIS — I7090 Unspecified atherosclerosis: Secondary | ICD-10-CM | POA: Diagnosis not present

## 2021-04-22 DIAGNOSIS — H9190 Unspecified hearing loss, unspecified ear: Secondary | ICD-10-CM | POA: Diagnosis not present

## 2021-04-23 ENCOUNTER — Ambulatory Visit: Payer: Medicare Other | Admitting: Podiatry

## 2021-04-23 DIAGNOSIS — E559 Vitamin D deficiency, unspecified: Secondary | ICD-10-CM | POA: Diagnosis not present

## 2021-04-23 DIAGNOSIS — D519 Vitamin B12 deficiency anemia, unspecified: Secondary | ICD-10-CM | POA: Diagnosis not present

## 2021-04-23 DIAGNOSIS — I10 Essential (primary) hypertension: Secondary | ICD-10-CM | POA: Diagnosis not present

## 2021-04-23 DIAGNOSIS — R7303 Prediabetes: Secondary | ICD-10-CM | POA: Diagnosis not present

## 2021-04-23 DIAGNOSIS — E039 Hypothyroidism, unspecified: Secondary | ICD-10-CM | POA: Diagnosis not present

## 2021-04-24 DIAGNOSIS — S43005D Unspecified dislocation of left shoulder joint, subsequent encounter: Secondary | ICD-10-CM | POA: Diagnosis not present

## 2021-04-24 DIAGNOSIS — E785 Hyperlipidemia, unspecified: Secondary | ICD-10-CM | POA: Diagnosis not present

## 2021-04-24 DIAGNOSIS — E039 Hypothyroidism, unspecified: Secondary | ICD-10-CM | POA: Diagnosis not present

## 2021-04-24 DIAGNOSIS — I10 Essential (primary) hypertension: Secondary | ICD-10-CM | POA: Diagnosis not present

## 2021-04-24 DIAGNOSIS — H9190 Unspecified hearing loss, unspecified ear: Secondary | ICD-10-CM | POA: Diagnosis not present

## 2021-04-24 DIAGNOSIS — F0390 Unspecified dementia without behavioral disturbance: Secondary | ICD-10-CM | POA: Diagnosis not present

## 2021-04-25 DIAGNOSIS — E039 Hypothyroidism, unspecified: Secondary | ICD-10-CM | POA: Diagnosis not present

## 2021-04-25 DIAGNOSIS — E871 Hypo-osmolality and hyponatremia: Secondary | ICD-10-CM | POA: Diagnosis not present

## 2021-04-25 DIAGNOSIS — D649 Anemia, unspecified: Secondary | ICD-10-CM | POA: Diagnosis not present

## 2021-04-25 DIAGNOSIS — F32A Depression, unspecified: Secondary | ICD-10-CM | POA: Diagnosis not present

## 2021-04-29 DIAGNOSIS — I10 Essential (primary) hypertension: Secondary | ICD-10-CM | POA: Diagnosis not present

## 2021-04-29 DIAGNOSIS — S43005D Unspecified dislocation of left shoulder joint, subsequent encounter: Secondary | ICD-10-CM | POA: Diagnosis not present

## 2021-04-29 DIAGNOSIS — F0390 Unspecified dementia without behavioral disturbance: Secondary | ICD-10-CM | POA: Diagnosis not present

## 2021-04-29 DIAGNOSIS — H9190 Unspecified hearing loss, unspecified ear: Secondary | ICD-10-CM | POA: Diagnosis not present

## 2021-04-29 DIAGNOSIS — E785 Hyperlipidemia, unspecified: Secondary | ICD-10-CM | POA: Diagnosis not present

## 2021-04-29 DIAGNOSIS — E039 Hypothyroidism, unspecified: Secondary | ICD-10-CM | POA: Diagnosis not present

## 2021-05-01 DIAGNOSIS — H9190 Unspecified hearing loss, unspecified ear: Secondary | ICD-10-CM | POA: Diagnosis not present

## 2021-05-01 DIAGNOSIS — F0390 Unspecified dementia without behavioral disturbance: Secondary | ICD-10-CM | POA: Diagnosis not present

## 2021-05-01 DIAGNOSIS — E039 Hypothyroidism, unspecified: Secondary | ICD-10-CM | POA: Diagnosis not present

## 2021-05-01 DIAGNOSIS — E785 Hyperlipidemia, unspecified: Secondary | ICD-10-CM | POA: Diagnosis not present

## 2021-05-01 DIAGNOSIS — S43005D Unspecified dislocation of left shoulder joint, subsequent encounter: Secondary | ICD-10-CM | POA: Diagnosis not present

## 2021-05-01 DIAGNOSIS — I10 Essential (primary) hypertension: Secondary | ICD-10-CM | POA: Diagnosis not present

## 2021-05-08 DIAGNOSIS — S43005D Unspecified dislocation of left shoulder joint, subsequent encounter: Secondary | ICD-10-CM | POA: Diagnosis not present

## 2021-05-08 DIAGNOSIS — F0390 Unspecified dementia without behavioral disturbance: Secondary | ICD-10-CM | POA: Diagnosis not present

## 2021-05-08 DIAGNOSIS — E785 Hyperlipidemia, unspecified: Secondary | ICD-10-CM | POA: Diagnosis not present

## 2021-05-08 DIAGNOSIS — I10 Essential (primary) hypertension: Secondary | ICD-10-CM | POA: Diagnosis not present

## 2021-05-08 DIAGNOSIS — H9190 Unspecified hearing loss, unspecified ear: Secondary | ICD-10-CM | POA: Diagnosis not present

## 2021-05-08 DIAGNOSIS — E039 Hypothyroidism, unspecified: Secondary | ICD-10-CM | POA: Diagnosis not present

## 2021-05-09 DIAGNOSIS — E039 Hypothyroidism, unspecified: Secondary | ICD-10-CM | POA: Diagnosis not present

## 2021-05-09 DIAGNOSIS — I10 Essential (primary) hypertension: Secondary | ICD-10-CM | POA: Diagnosis not present

## 2021-05-09 DIAGNOSIS — E785 Hyperlipidemia, unspecified: Secondary | ICD-10-CM | POA: Diagnosis not present

## 2021-05-13 DIAGNOSIS — H9190 Unspecified hearing loss, unspecified ear: Secondary | ICD-10-CM | POA: Diagnosis not present

## 2021-05-13 DIAGNOSIS — I10 Essential (primary) hypertension: Secondary | ICD-10-CM | POA: Diagnosis not present

## 2021-05-13 DIAGNOSIS — F0390 Unspecified dementia without behavioral disturbance: Secondary | ICD-10-CM | POA: Diagnosis not present

## 2021-05-13 DIAGNOSIS — E785 Hyperlipidemia, unspecified: Secondary | ICD-10-CM | POA: Diagnosis not present

## 2021-05-13 DIAGNOSIS — E039 Hypothyroidism, unspecified: Secondary | ICD-10-CM | POA: Diagnosis not present

## 2021-05-13 DIAGNOSIS — S43005D Unspecified dislocation of left shoulder joint, subsequent encounter: Secondary | ICD-10-CM | POA: Diagnosis not present

## 2021-05-15 ENCOUNTER — Other Ambulatory Visit (HOSPITAL_BASED_OUTPATIENT_CLINIC_OR_DEPARTMENT_OTHER): Payer: Self-pay | Admitting: Orthopaedic Surgery

## 2021-05-15 DIAGNOSIS — M25512 Pain in left shoulder: Secondary | ICD-10-CM

## 2021-05-16 ENCOUNTER — Ambulatory Visit (HOSPITAL_BASED_OUTPATIENT_CLINIC_OR_DEPARTMENT_OTHER)
Admission: RE | Admit: 2021-05-16 | Discharge: 2021-05-16 | Disposition: A | Discharge status: Home / Self Care | Payer: Medicare Other | Source: Ambulatory Visit | Attending: Orthopaedic Surgery | Admitting: Orthopaedic Surgery

## 2021-05-16 ENCOUNTER — Other Ambulatory Visit: Payer: Self-pay

## 2021-05-16 ENCOUNTER — Ambulatory Visit (INDEPENDENT_AMBULATORY_CARE_PROVIDER_SITE_OTHER): Payer: Medicare Other | Admitting: Orthopaedic Surgery

## 2021-05-16 VITALS — Ht 63.0 in | Wt 130.0 lb

## 2021-05-16 DIAGNOSIS — M25512 Pain in left shoulder: Secondary | ICD-10-CM | POA: Insufficient documentation

## 2021-05-16 DIAGNOSIS — M12812 Other specific arthropathies, not elsewhere classified, left shoulder: Secondary | ICD-10-CM

## 2021-05-16 DIAGNOSIS — S43005A Unspecified dislocation of left shoulder joint, initial encounter: Secondary | ICD-10-CM | POA: Diagnosis not present

## 2021-05-16 NOTE — Progress Notes (Signed)
Chief Complaint: Left shoulder pain     History of Present Illness:   Pain Score: 8/10 SANE: 10/100  Danny Frederick is a 87 y.o. male with ongoing left shoulder pain and weakness after a left shoulder dislocation occurring in the beginning of August.  He initially presented to the emergency room was found to have a shoulder dislocation which was reduced.  He has baseline balance issues which are a result of his stroke.  Since his shoulder dislocation he notes increased weakness and inability to lift the shoulder.  He states that the pain is overall mild.  He does have a history of rotator cuff repair done 10 years ago on the side.  He does not remember the surgeon.  This was done in [place].  Overall his biggest issues are inability to pull up his pants and use his left hand for hygiene although he is tolerating this well given the fact that he has minimal pain.  He has not had any injections or therapy.    Surgical History:   Left shoulder rotator cuff repair on his left side 10 years prior.  PMH/PSH/Family History/Social History/Meds/Allergies:    Past Medical History:  Diagnosis Date   Allergic conjunctivitis    Allergic rhinitis, cause unspecified    on allergy shots (Dr. Orvil Feil)   Atherosclerosis of aorta [facility])    noted on CT angio of abd/pelvis, in many vessels   BPH (benign prostatic hypertrophy)    Diverticulosis of colon 12/09   Foot drop, right    GERD (gastroesophageal reflux disease)    Hearing loss in left ear    both ears now   History of Clostridium difficile    multiple times 2016--s/p fecal transplant (Dr. Baxter Flattery).   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    Right ventricular dysfunction 01/23/2014   Secondary to obstruction of main pulmonary artery   Ruptured thoracic aortic aneurysm ([place]) 01/23/2014   S/P ascending aortic aneurysm repair 01/24/2014   Unspecified hypothyroidism    Past Surgical History:  Procedure Laterality Date   CATARACT EXTRACTION, BILATERAL Bilateral april/may 2021   CERVICAL LAMINECTOMY  1972   C5-6   COLONOSCOPY WITH PROPOFOL N/A 11/09/2014   Procedure: COLONOSCOPY WITH PROPOFOL;  Surgeon: Jerene Bears, MD;  Location: [facility];  Service: Gastroenterology;  Laterality: N/A;   ESOPHAGOGASTRODUODENOSCOPY  10/31/08   normal; Dr. Bebe Liter TRANSPLANT N/A 11/09/2014   Procedure: FECAL TRANSPLANT;  Surgeon: Jerene Bears, MD;  Location: [facility];  Service: Gastroenterology;  Laterality: N/A;   HEMORRHOIDECTOMY WITH HEMORRHOID BANDING  04/07/13   hemorroidal banding  04/07/2013   x3-Dr.Eric Redmond Pulling   PROSTATE SURGERY  2007   photovaporization   ROTATOR CUFF REPAIR  10/2008   left; Dr. Onnie Graham   SPINE SURGERY  2006   L4-5 disk surgery   SPINE SURGERY  04/2013   L4-5, L5-S1 fusion.  Dr. Christella Noa   THORACIC AORTIC ANEURYSM REPAIR N/A 01/23/2014   Procedure: [place] (  AAA);  Surgeon: Rexene Alberts, MD;  Location: [facility];  Service: Open Heart Surgery;  Laterality: N/A;   TONSILLECTOMY     Social History   Socioeconomic History   Marital status: Married    Spouse name: Not on file   Number of children: 2   Years of education: Not on file   Highest education level: Not on file  Occupational History   Not on file  Tobacco Use   Smoking status: Former    Types: Cigarettes    Quit date: 09/01/1977    Years since quitting: 43.7   Smokeless tobacco: Never  Vaping Use   Vaping Use: Never used  Substance and Sexual Activity   Alcohol use: Yes    Comment: 2 glasses wine per day (small)   Drug use: No   Sexual activity: Not on file  Other Topics Concern   Not on file  Social History Narrative    Lives with wife. 1 daughter in [place], 1 daughter in [place], [place].   Getting stairlift installed 01/2019   5 grandchildren.   Social Determinants of Health   Financial Resource Strain: Not on file  Food Insecurity: Not on file  Transportation Needs: Not on file  Physical Activity: Not on file  Stress: Not on file  Social Connections: Not on file   Family History  Problem Relation Age of Onset   Diabetes Mother    Heart disease Mother        CHF   Hypertension Mother    Depression Mother    Stroke Father    Parkinsonism Father    Hypertension Father    Cancer Brother        bladder cancer   Cancer Brother        prostate cancer   Emphysema Brother    Diabetes Sister    COPD Sister    Colon cancer Neg Hx    No Known Allergies Current Outpatient Medications  Medication Sig Dispense Refill   amLODipine (NORVASC) 5 MG tablet TAKE 1 TABLET BY MOUTH  TWICE DAILY 180 tablet 3   atorvastatin (LIPITOR) 10 MG tablet TAKE 1 TABLET BY MOUTH  DAILY 90 tablet 0   Cholecalciferol (VITAMIN D3) 2000 units TABS Take 2,000 Units by mouth daily.      citalopram (CELEXA) 20 MG tablet Take 1 tablet (20 mg total) by mouth daily. 90 tablet 3   Ferrous Sulfate (IRON) 325 (65 Fe) MG TABS Take 1 tablet (325 mg total) by mouth every other day. Reported on 11/08/2015 30 each 0   irbesartan (AVAPRO) 300 MG tablet TAKE 1 TABLET BY MOUTH  DAILY 90 tablet 3   metoprolol tartrate (LOPRESSOR) 25 MG tablet TAKE 1 TABLET BY MOUTH  TWICE DAILY 180 tablet 2   omeprazole (PRILOSEC) 20 MG capsule TAKE 1 CAPSULE BY MOUTH  EVERY OTHER DAY 45 capsule 3   OVER THE COUNTER MEDICATION Place 1 application into both eyes daily as needed (dry eyes/irritation). OTC eye gel (Patient not taking: Reported on 04/04/2021)     SYNTHROID 50 MCG tablet TAKE 1 TABLET BY MOUTH  DAILY BEFORE BREAKFAST 90 tablet 0   No current facility-administered medications for this visit.   No results found.  Review of Systems:   A ROS  was performed including pertinent positives and negatives as documented in the HPI.  Physical Exam :   Constitutional: NAD and appears stated age Neurological: Alert and oriented Psych: Appropriate affect and cooperative There were no  vitals taken for this visit.   Comprehensive Musculoskeletal Exam:    Musculoskeletal Exam    Inspection Right Left  Skin No atrophy or winging +superior escape  Palpation    Tenderness none GH, lateral shoulder  Range of Motion    Flexion (passive) 150 50  Flexion (active) 120 20  Extension 0 0  Abduction 120 20  ER at the side 30 0  ER at 90 of abduction Not tested Not tested  IR at 90 of abduction Not tested Not tested  Can reach behind back to L1 Back pocket  Strength     full Weakness with pain, +pseudoparalysis  Special Tests    Pseudoparalytic No No  Neurologic    Fires PIN, radial, median, ulnar, musculocutaneous, axillary, suprascapular, long thoracic, and spinal accessory innervated muscles. No abnormal sensibility  Vascular/Lymphatic    Radial Pulse 2+ 2+  Cervical Exam    Patient has symmetric cervical range of motion with negative Spurling's test.  Special Test: Intact sensation over the deltoid and axillary distribution fires all 3 heads of the deltoid     Imaging:   Xray (left shoulder 3 views): Significant rotator cuff arthropathy with loss of acromiohumeral interval   I personally reviewed and interpreted the radiographs.   Assessment:   87 year old male with evidence of left shoulder rotator cuff arthropathy who is status post shoulder dislocation 6 weeks prior.  On exam he does appear to have intact axillary nerve.  We discussed possible treatment options including nonoperative management with therapy versus reverse shoulder arthroplasty.  At this point I do not believe that physical therapy would provide any type of increased motion or decrease pain given his significant weakness and rotator cuff arthropathy.  I have  advised that a reverse shoulder arthroplasty would likely provide the best long-term function.  We did discuss the risks associated with this including infection and failure of the components, need for revision of the components.  He understands this.  Given the fact he does not have significant pain at this time he is electing to continue with conservative management.  I have provided him my card should he want to further consider reverse shoulder arthroplasty  Plan :    -He will follow-up as needed should he develop more significant pain and ultimately desire reverse shoulder arthroplasty      I personally saw and evaluated the patient, and participated in the management and treatment plan.  Vanetta Mulders, MD Attending Physician, Orthopedic Surgery  This document was dictated using Dragon voice recognition software. A reasonable attempt at proof reading has been made to minimize errors.

## 2021-05-18 DIAGNOSIS — S43005D Unspecified dislocation of left shoulder joint, subsequent encounter: Secondary | ICD-10-CM | POA: Diagnosis not present

## 2021-05-18 DIAGNOSIS — H9190 Unspecified hearing loss, unspecified ear: Secondary | ICD-10-CM | POA: Diagnosis not present

## 2021-05-18 DIAGNOSIS — E039 Hypothyroidism, unspecified: Secondary | ICD-10-CM | POA: Diagnosis not present

## 2021-05-18 DIAGNOSIS — I10 Essential (primary) hypertension: Secondary | ICD-10-CM | POA: Diagnosis not present

## 2021-05-18 DIAGNOSIS — F0390 Unspecified dementia without behavioral disturbance: Secondary | ICD-10-CM | POA: Diagnosis not present

## 2021-05-18 DIAGNOSIS — E785 Hyperlipidemia, unspecified: Secondary | ICD-10-CM | POA: Diagnosis not present

## 2021-05-21 DIAGNOSIS — I10 Essential (primary) hypertension: Secondary | ICD-10-CM | POA: Diagnosis not present

## 2021-05-21 DIAGNOSIS — F0390 Unspecified dementia without behavioral disturbance: Secondary | ICD-10-CM | POA: Diagnosis not present

## 2021-05-21 DIAGNOSIS — H9190 Unspecified hearing loss, unspecified ear: Secondary | ICD-10-CM | POA: Diagnosis not present

## 2021-05-21 DIAGNOSIS — E785 Hyperlipidemia, unspecified: Secondary | ICD-10-CM | POA: Diagnosis not present

## 2021-05-21 DIAGNOSIS — E039 Hypothyroidism, unspecified: Secondary | ICD-10-CM | POA: Diagnosis not present

## 2021-05-21 DIAGNOSIS — S43005D Unspecified dislocation of left shoulder joint, subsequent encounter: Secondary | ICD-10-CM | POA: Diagnosis not present

## 2021-05-22 DIAGNOSIS — E559 Vitamin D deficiency, unspecified: Secondary | ICD-10-CM | POA: Diagnosis not present

## 2021-05-22 DIAGNOSIS — S43005D Unspecified dislocation of left shoulder joint, subsequent encounter: Secondary | ICD-10-CM | POA: Diagnosis not present

## 2021-05-22 DIAGNOSIS — H9190 Unspecified hearing loss, unspecified ear: Secondary | ICD-10-CM | POA: Diagnosis not present

## 2021-05-22 DIAGNOSIS — K219 Gastro-esophageal reflux disease without esophagitis: Secondary | ICD-10-CM | POA: Diagnosis not present

## 2021-05-22 DIAGNOSIS — E039 Hypothyroidism, unspecified: Secondary | ICD-10-CM | POA: Diagnosis not present

## 2021-05-22 DIAGNOSIS — Z87891 Personal history of nicotine dependence: Secondary | ICD-10-CM | POA: Diagnosis not present

## 2021-05-22 DIAGNOSIS — Z9181 History of falling: Secondary | ICD-10-CM | POA: Diagnosis not present

## 2021-05-22 DIAGNOSIS — D649 Anemia, unspecified: Secondary | ICD-10-CM | POA: Diagnosis not present

## 2021-05-22 DIAGNOSIS — F0390 Unspecified dementia without behavioral disturbance: Secondary | ICD-10-CM | POA: Diagnosis not present

## 2021-05-22 DIAGNOSIS — I10 Essential (primary) hypertension: Secondary | ICD-10-CM | POA: Diagnosis not present

## 2021-05-22 DIAGNOSIS — I7090 Unspecified atherosclerosis: Secondary | ICD-10-CM | POA: Diagnosis not present

## 2021-05-22 DIAGNOSIS — E785 Hyperlipidemia, unspecified: Secondary | ICD-10-CM | POA: Diagnosis not present

## 2021-05-22 DIAGNOSIS — F32A Depression, unspecified: Secondary | ICD-10-CM | POA: Diagnosis not present

## 2021-05-23 DIAGNOSIS — I10 Essential (primary) hypertension: Secondary | ICD-10-CM | POA: Diagnosis not present

## 2021-05-23 DIAGNOSIS — F0390 Unspecified dementia without behavioral disturbance: Secondary | ICD-10-CM | POA: Diagnosis not present

## 2021-05-23 DIAGNOSIS — E039 Hypothyroidism, unspecified: Secondary | ICD-10-CM | POA: Diagnosis not present

## 2021-05-23 DIAGNOSIS — H9190 Unspecified hearing loss, unspecified ear: Secondary | ICD-10-CM | POA: Diagnosis not present

## 2021-05-23 DIAGNOSIS — S43005D Unspecified dislocation of left shoulder joint, subsequent encounter: Secondary | ICD-10-CM | POA: Diagnosis not present

## 2021-05-23 DIAGNOSIS — E785 Hyperlipidemia, unspecified: Secondary | ICD-10-CM | POA: Diagnosis not present

## 2021-05-27 DIAGNOSIS — E039 Hypothyroidism, unspecified: Secondary | ICD-10-CM | POA: Diagnosis not present

## 2021-05-27 DIAGNOSIS — F0390 Unspecified dementia without behavioral disturbance: Secondary | ICD-10-CM | POA: Diagnosis not present

## 2021-05-27 DIAGNOSIS — E785 Hyperlipidemia, unspecified: Secondary | ICD-10-CM | POA: Diagnosis not present

## 2021-05-27 DIAGNOSIS — S43005D Unspecified dislocation of left shoulder joint, subsequent encounter: Secondary | ICD-10-CM | POA: Diagnosis not present

## 2021-05-27 DIAGNOSIS — H9190 Unspecified hearing loss, unspecified ear: Secondary | ICD-10-CM | POA: Diagnosis not present

## 2021-05-27 DIAGNOSIS — I10 Essential (primary) hypertension: Secondary | ICD-10-CM | POA: Diagnosis not present

## 2021-05-28 DIAGNOSIS — E785 Hyperlipidemia, unspecified: Secondary | ICD-10-CM | POA: Diagnosis not present

## 2021-05-28 DIAGNOSIS — F0390 Unspecified dementia without behavioral disturbance: Secondary | ICD-10-CM | POA: Diagnosis not present

## 2021-05-28 DIAGNOSIS — E039 Hypothyroidism, unspecified: Secondary | ICD-10-CM | POA: Diagnosis not present

## 2021-05-28 DIAGNOSIS — I10 Essential (primary) hypertension: Secondary | ICD-10-CM | POA: Diagnosis not present

## 2021-05-28 DIAGNOSIS — S43005D Unspecified dislocation of left shoulder joint, subsequent encounter: Secondary | ICD-10-CM | POA: Diagnosis not present

## 2021-05-28 DIAGNOSIS — H9190 Unspecified hearing loss, unspecified ear: Secondary | ICD-10-CM | POA: Diagnosis not present

## 2021-05-29 DIAGNOSIS — F32A Depression, unspecified: Secondary | ICD-10-CM | POA: Diagnosis not present

## 2021-06-02 DIAGNOSIS — H9190 Unspecified hearing loss, unspecified ear: Secondary | ICD-10-CM | POA: Diagnosis not present

## 2021-06-02 DIAGNOSIS — I10 Essential (primary) hypertension: Secondary | ICD-10-CM | POA: Diagnosis not present

## 2021-06-02 DIAGNOSIS — E039 Hypothyroidism, unspecified: Secondary | ICD-10-CM | POA: Diagnosis not present

## 2021-06-02 DIAGNOSIS — F0390 Unspecified dementia without behavioral disturbance: Secondary | ICD-10-CM | POA: Diagnosis not present

## 2021-06-02 DIAGNOSIS — S43005D Unspecified dislocation of left shoulder joint, subsequent encounter: Secondary | ICD-10-CM | POA: Diagnosis not present

## 2021-06-02 DIAGNOSIS — E785 Hyperlipidemia, unspecified: Secondary | ICD-10-CM | POA: Diagnosis not present

## 2021-06-06 DIAGNOSIS — M25512 Pain in left shoulder: Secondary | ICD-10-CM | POA: Diagnosis not present

## 2021-06-06 DIAGNOSIS — E871 Hypo-osmolality and hyponatremia: Secondary | ICD-10-CM | POA: Diagnosis not present

## 2021-06-06 DIAGNOSIS — S46002A Unspecified injury of muscle(s) and tendon(s) of the rotator cuff of left shoulder, initial encounter: Secondary | ICD-10-CM | POA: Diagnosis not present

## 2021-06-11 DIAGNOSIS — S43005D Unspecified dislocation of left shoulder joint, subsequent encounter: Secondary | ICD-10-CM | POA: Diagnosis not present

## 2021-06-11 DIAGNOSIS — H9190 Unspecified hearing loss, unspecified ear: Secondary | ICD-10-CM | POA: Diagnosis not present

## 2021-06-11 DIAGNOSIS — E039 Hypothyroidism, unspecified: Secondary | ICD-10-CM | POA: Diagnosis not present

## 2021-06-11 DIAGNOSIS — I10 Essential (primary) hypertension: Secondary | ICD-10-CM | POA: Diagnosis not present

## 2021-06-11 DIAGNOSIS — E785 Hyperlipidemia, unspecified: Secondary | ICD-10-CM | POA: Diagnosis not present

## 2021-06-11 DIAGNOSIS — F0390 Unspecified dementia without behavioral disturbance: Secondary | ICD-10-CM | POA: Diagnosis not present

## 2021-06-13 DIAGNOSIS — H9190 Unspecified hearing loss, unspecified ear: Secondary | ICD-10-CM | POA: Diagnosis not present

## 2021-06-13 DIAGNOSIS — S43005D Unspecified dislocation of left shoulder joint, subsequent encounter: Secondary | ICD-10-CM | POA: Diagnosis not present

## 2021-06-13 DIAGNOSIS — I10 Essential (primary) hypertension: Secondary | ICD-10-CM | POA: Diagnosis not present

## 2021-06-13 DIAGNOSIS — E785 Hyperlipidemia, unspecified: Secondary | ICD-10-CM | POA: Diagnosis not present

## 2021-06-13 DIAGNOSIS — E039 Hypothyroidism, unspecified: Secondary | ICD-10-CM | POA: Diagnosis not present

## 2021-06-13 DIAGNOSIS — F0390 Unspecified dementia without behavioral disturbance: Secondary | ICD-10-CM | POA: Diagnosis not present

## 2021-06-18 DIAGNOSIS — S43005D Unspecified dislocation of left shoulder joint, subsequent encounter: Secondary | ICD-10-CM | POA: Diagnosis not present

## 2021-06-18 DIAGNOSIS — E039 Hypothyroidism, unspecified: Secondary | ICD-10-CM | POA: Diagnosis not present

## 2021-06-18 DIAGNOSIS — F0390 Unspecified dementia without behavioral disturbance: Secondary | ICD-10-CM | POA: Diagnosis not present

## 2021-06-18 DIAGNOSIS — E785 Hyperlipidemia, unspecified: Secondary | ICD-10-CM | POA: Diagnosis not present

## 2021-06-18 DIAGNOSIS — I10 Essential (primary) hypertension: Secondary | ICD-10-CM | POA: Diagnosis not present

## 2021-06-18 DIAGNOSIS — H9190 Unspecified hearing loss, unspecified ear: Secondary | ICD-10-CM | POA: Diagnosis not present

## 2021-06-19 DIAGNOSIS — E785 Hyperlipidemia, unspecified: Secondary | ICD-10-CM | POA: Diagnosis not present

## 2021-06-19 DIAGNOSIS — F0390 Unspecified dementia without behavioral disturbance: Secondary | ICD-10-CM | POA: Diagnosis not present

## 2021-06-19 DIAGNOSIS — E039 Hypothyroidism, unspecified: Secondary | ICD-10-CM | POA: Diagnosis not present

## 2021-06-19 DIAGNOSIS — S43005D Unspecified dislocation of left shoulder joint, subsequent encounter: Secondary | ICD-10-CM | POA: Diagnosis not present

## 2021-06-19 DIAGNOSIS — I10 Essential (primary) hypertension: Secondary | ICD-10-CM | POA: Diagnosis not present

## 2021-06-19 DIAGNOSIS — H9190 Unspecified hearing loss, unspecified ear: Secondary | ICD-10-CM | POA: Diagnosis not present

## 2021-07-05 DIAGNOSIS — M6281 Muscle weakness (generalized): Secondary | ICD-10-CM | POA: Diagnosis not present

## 2021-07-09 DIAGNOSIS — M6281 Muscle weakness (generalized): Secondary | ICD-10-CM | POA: Diagnosis not present

## 2021-07-11 DIAGNOSIS — F330 Major depressive disorder, recurrent, mild: Secondary | ICD-10-CM | POA: Diagnosis not present

## 2021-07-11 DIAGNOSIS — R413 Other amnesia: Secondary | ICD-10-CM | POA: Diagnosis not present

## 2021-07-11 DIAGNOSIS — M6281 Muscle weakness (generalized): Secondary | ICD-10-CM | POA: Diagnosis not present

## 2021-07-14 DIAGNOSIS — M6281 Muscle weakness (generalized): Secondary | ICD-10-CM | POA: Diagnosis not present

## 2021-07-16 DIAGNOSIS — E039 Hypothyroidism, unspecified: Secondary | ICD-10-CM | POA: Diagnosis not present

## 2021-07-16 DIAGNOSIS — I10 Essential (primary) hypertension: Secondary | ICD-10-CM | POA: Diagnosis not present

## 2021-07-22 DIAGNOSIS — M6281 Muscle weakness (generalized): Secondary | ICD-10-CM | POA: Diagnosis not present

## 2021-07-26 DIAGNOSIS — E039 Hypothyroidism, unspecified: Secondary | ICD-10-CM | POA: Diagnosis not present

## 2021-07-26 DIAGNOSIS — I10 Essential (primary) hypertension: Secondary | ICD-10-CM | POA: Diagnosis not present

## 2021-07-26 DIAGNOSIS — E785 Hyperlipidemia, unspecified: Secondary | ICD-10-CM | POA: Diagnosis not present

## 2021-07-26 DIAGNOSIS — F0390 Unspecified dementia without behavioral disturbance: Secondary | ICD-10-CM | POA: Diagnosis not present

## 2021-07-26 DIAGNOSIS — S46002A Unspecified injury of muscle(s) and tendon(s) of the rotator cuff of left shoulder, initial encounter: Secondary | ICD-10-CM | POA: Diagnosis not present

## 2021-07-29 DIAGNOSIS — M6281 Muscle weakness (generalized): Secondary | ICD-10-CM | POA: Diagnosis not present

## 2021-08-01 DIAGNOSIS — M6281 Muscle weakness (generalized): Secondary | ICD-10-CM | POA: Diagnosis not present

## 2021-08-01 DIAGNOSIS — R0989 Other specified symptoms and signs involving the circulatory and respiratory systems: Secondary | ICD-10-CM | POA: Diagnosis not present

## 2021-08-01 DIAGNOSIS — R051 Acute cough: Secondary | ICD-10-CM | POA: Diagnosis not present

## 2021-08-02 DIAGNOSIS — R918 Other nonspecific abnormal finding of lung field: Secondary | ICD-10-CM | POA: Diagnosis not present

## 2021-08-05 DIAGNOSIS — M6281 Muscle weakness (generalized): Secondary | ICD-10-CM | POA: Diagnosis not present

## 2021-08-08 DIAGNOSIS — R051 Acute cough: Secondary | ICD-10-CM | POA: Diagnosis not present

## 2021-08-08 DIAGNOSIS — M6281 Muscle weakness (generalized): Secondary | ICD-10-CM | POA: Diagnosis not present

## 2021-08-08 DIAGNOSIS — R0989 Other specified symptoms and signs involving the circulatory and respiratory systems: Secondary | ICD-10-CM | POA: Diagnosis not present

## 2021-08-08 DIAGNOSIS — J189 Pneumonia, unspecified organism: Secondary | ICD-10-CM | POA: Diagnosis not present

## 2021-08-13 DIAGNOSIS — M6281 Muscle weakness (generalized): Secondary | ICD-10-CM | POA: Diagnosis not present

## 2021-08-20 DIAGNOSIS — M6281 Muscle weakness (generalized): Secondary | ICD-10-CM | POA: Diagnosis not present

## 2021-08-22 DIAGNOSIS — R413 Other amnesia: Secondary | ICD-10-CM | POA: Diagnosis not present

## 2021-08-22 DIAGNOSIS — F330 Major depressive disorder, recurrent, mild: Secondary | ICD-10-CM | POA: Diagnosis not present

## 2021-08-27 DIAGNOSIS — M6281 Muscle weakness (generalized): Secondary | ICD-10-CM | POA: Diagnosis not present

## 2021-08-29 DIAGNOSIS — M6281 Muscle weakness (generalized): Secondary | ICD-10-CM | POA: Diagnosis not present

## 2021-09-02 DIAGNOSIS — M6281 Muscle weakness (generalized): Secondary | ICD-10-CM | POA: Diagnosis not present

## 2021-09-05 DIAGNOSIS — M6281 Muscle weakness (generalized): Secondary | ICD-10-CM | POA: Diagnosis not present

## 2021-09-12 DIAGNOSIS — M6281 Muscle weakness (generalized): Secondary | ICD-10-CM | POA: Diagnosis not present

## 2021-09-18 DIAGNOSIS — M6281 Muscle weakness (generalized): Secondary | ICD-10-CM | POA: Diagnosis not present

## 2021-09-20 DIAGNOSIS — M6281 Muscle weakness (generalized): Secondary | ICD-10-CM | POA: Diagnosis not present

## 2021-09-25 DIAGNOSIS — M6281 Muscle weakness (generalized): Secondary | ICD-10-CM | POA: Diagnosis not present

## 2021-10-01 DIAGNOSIS — M6281 Muscle weakness (generalized): Secondary | ICD-10-CM | POA: Diagnosis not present

## 2021-10-02 ENCOUNTER — Telehealth: Payer: Self-pay | Admitting: Family Medicine

## 2021-10-02 NOTE — Telephone Encounter (Signed)
Danny Frederick called and states that she has scheduled a hearing test with [facility] for pt. [facility] is requesting a referral. Please send. Danny Frederick can be reached at [phone].

## 2021-10-02 NOTE — Telephone Encounter (Signed)
Ok for referral?

## 2021-10-02 NOTE — Telephone Encounter (Signed)
Done

## 2021-10-07 DIAGNOSIS — M6281 Muscle weakness (generalized): Secondary | ICD-10-CM | POA: Diagnosis not present

## 2021-10-09 DIAGNOSIS — H903 Sensorineural hearing loss, bilateral: Secondary | ICD-10-CM | POA: Diagnosis not present

## 2021-10-18 DIAGNOSIS — M6281 Muscle weakness (generalized): Secondary | ICD-10-CM | POA: Diagnosis not present

## 2021-10-21 ENCOUNTER — Ambulatory Visit: Payer: Medicare Other | Admitting: Family Medicine

## 2021-10-21 DIAGNOSIS — M6281 Muscle weakness (generalized): Secondary | ICD-10-CM | POA: Diagnosis not present

## 2021-10-23 DIAGNOSIS — M6281 Muscle weakness (generalized): Secondary | ICD-10-CM | POA: Diagnosis not present

## 2021-10-28 DIAGNOSIS — M6281 Muscle weakness (generalized): Secondary | ICD-10-CM | POA: Diagnosis not present

## 2021-11-07 DIAGNOSIS — F330 Major depressive disorder, recurrent, mild: Secondary | ICD-10-CM | POA: Diagnosis not present

## 2021-11-07 DIAGNOSIS — R413 Other amnesia: Secondary | ICD-10-CM | POA: Diagnosis not present

## 2021-11-11 ENCOUNTER — Encounter: Payer: Self-pay | Admitting: Family Medicine

## 2021-11-15 DIAGNOSIS — M6281 Muscle weakness (generalized): Secondary | ICD-10-CM | POA: Diagnosis not present

## 2021-11-25 ENCOUNTER — Inpatient Hospital Stay (HOSPITAL_COMMUNITY)
Admission: EM | Admit: 2021-11-25 | Discharge: 2021-11-29 | DRG: 066 | Disposition: A | Discharge status: Hospice | Payer: Medicare Other | Attending: Family Medicine | Admitting: Family Medicine

## 2021-11-25 ENCOUNTER — Emergency Department (HOSPITAL_COMMUNITY): Payer: Medicare Other

## 2021-11-25 ENCOUNTER — Other Ambulatory Visit: Payer: Self-pay

## 2021-11-25 ENCOUNTER — Encounter (HOSPITAL_COMMUNITY): Payer: Self-pay | Admitting: Emergency Medicine

## 2021-11-25 DIAGNOSIS — Z8673 Personal history of transient ischemic attack (TIA), and cerebral infarction without residual deficits: Secondary | ICD-10-CM | POA: Diagnosis not present

## 2021-11-25 DIAGNOSIS — Z833 Family history of diabetes mellitus: Secondary | ICD-10-CM

## 2021-11-25 DIAGNOSIS — H5121 Internuclear ophthalmoplegia, right eye: Secondary | ICD-10-CM | POA: Diagnosis present

## 2021-11-25 DIAGNOSIS — Z820 Family history of epilepsy and other diseases of the nervous system: Secondary | ICD-10-CM | POA: Diagnosis not present

## 2021-11-25 DIAGNOSIS — I6381 Other cerebral infarction due to occlusion or stenosis of small artery: Principal | ICD-10-CM | POA: Diagnosis present

## 2021-11-25 DIAGNOSIS — Z825 Family history of asthma and other chronic lower respiratory diseases: Secondary | ICD-10-CM | POA: Diagnosis not present

## 2021-11-25 DIAGNOSIS — H9193 Unspecified hearing loss, bilateral: Secondary | ICD-10-CM | POA: Diagnosis present

## 2021-11-25 DIAGNOSIS — G459 Transient cerebral ischemic attack, unspecified: Secondary | ICD-10-CM | POA: Diagnosis not present

## 2021-11-25 DIAGNOSIS — Z981 Arthrodesis status: Secondary | ICD-10-CM | POA: Diagnosis not present

## 2021-11-25 DIAGNOSIS — Z823 Family history of stroke: Secondary | ICD-10-CM | POA: Diagnosis not present

## 2021-11-25 DIAGNOSIS — E785 Hyperlipidemia, unspecified: Secondary | ICD-10-CM | POA: Diagnosis not present

## 2021-11-25 DIAGNOSIS — Z87891 Personal history of nicotine dependence: Secondary | ICD-10-CM

## 2021-11-25 DIAGNOSIS — I6523 Occlusion and stenosis of bilateral carotid arteries: Secondary | ICD-10-CM | POA: Diagnosis not present

## 2021-11-25 DIAGNOSIS — R29700 NIHSS score 0: Secondary | ICD-10-CM | POA: Diagnosis present

## 2021-11-25 DIAGNOSIS — Z79890 Hormone replacement therapy (postmenopausal): Secondary | ICD-10-CM

## 2021-11-25 DIAGNOSIS — Z86711 Personal history of pulmonary embolism: Secondary | ICD-10-CM

## 2021-11-25 DIAGNOSIS — Z9842 Cataract extraction status, left eye: Secondary | ICD-10-CM | POA: Diagnosis not present

## 2021-11-25 DIAGNOSIS — E039 Hypothyroidism, unspecified: Secondary | ICD-10-CM | POA: Diagnosis present

## 2021-11-25 DIAGNOSIS — I6503 Occlusion and stenosis of bilateral vertebral arteries: Secondary | ICD-10-CM | POA: Diagnosis not present

## 2021-11-25 DIAGNOSIS — R0902 Hypoxemia: Secondary | ICD-10-CM | POA: Diagnosis not present

## 2021-11-25 DIAGNOSIS — R29818 Other symptoms and signs involving the nervous system: Secondary | ICD-10-CM | POA: Diagnosis not present

## 2021-11-25 DIAGNOSIS — H532 Diplopia: Secondary | ICD-10-CM | POA: Diagnosis present

## 2021-11-25 DIAGNOSIS — I639 Cerebral infarction, unspecified: Secondary | ICD-10-CM | POA: Diagnosis not present

## 2021-11-25 DIAGNOSIS — E782 Mixed hyperlipidemia: Secondary | ICD-10-CM | POA: Diagnosis present

## 2021-11-25 DIAGNOSIS — I10 Essential (primary) hypertension: Secondary | ICD-10-CM | POA: Diagnosis not present

## 2021-11-25 DIAGNOSIS — Z66 Do not resuscitate: Secondary | ICD-10-CM | POA: Diagnosis present

## 2021-11-25 DIAGNOSIS — I700 Atherosclerosis of aorta: Secondary | ICD-10-CM | POA: Diagnosis present

## 2021-11-25 DIAGNOSIS — I6521 Occlusion and stenosis of right carotid artery: Secondary | ICD-10-CM | POA: Diagnosis not present

## 2021-11-25 DIAGNOSIS — Z8042 Family history of malignant neoplasm of prostate: Secondary | ICD-10-CM

## 2021-11-25 DIAGNOSIS — Z9841 Cataract extraction status, right eye: Secondary | ICD-10-CM

## 2021-11-25 DIAGNOSIS — Z8249 Family history of ischemic heart disease and other diseases of the circulatory system: Secondary | ICD-10-CM

## 2021-11-25 DIAGNOSIS — K219 Gastro-esophageal reflux disease without esophagitis: Secondary | ICD-10-CM | POA: Diagnosis present

## 2021-11-25 DIAGNOSIS — Z818 Family history of other mental and behavioral disorders: Secondary | ICD-10-CM

## 2021-11-25 DIAGNOSIS — R5381 Other malaise: Secondary | ICD-10-CM | POA: Diagnosis present

## 2021-11-25 DIAGNOSIS — Z8052 Family history of malignant neoplasm of bladder: Secondary | ICD-10-CM

## 2021-11-25 DIAGNOSIS — N400 Enlarged prostate without lower urinary tract symptoms: Secondary | ICD-10-CM | POA: Diagnosis present

## 2021-11-25 DIAGNOSIS — Z79899 Other long term (current) drug therapy: Secondary | ICD-10-CM

## 2021-11-25 DIAGNOSIS — Z95828 Presence of other vascular implants and grafts: Secondary | ICD-10-CM | POA: Diagnosis not present

## 2021-11-25 DIAGNOSIS — H538 Other visual disturbances: Secondary | ICD-10-CM | POA: Diagnosis not present

## 2021-11-25 LAB — CBC WITH DIFFERENTIAL/PLATELET
Abs Immature Granulocytes: 0.03 10*3/uL (ref 0.00–0.07)
Basophils Absolute: 0 10*3/uL (ref 0.0–0.1)
Basophils Relative: 0 %
Eosinophils Absolute: 0.2 10*3/uL (ref 0.0–0.5)
Eosinophils Relative: 2 %
HCT: 36.7 % — ABNORMAL LOW (ref 39.0–52.0)
Hemoglobin: 12 g/dL — ABNORMAL LOW (ref 13.0–17.0)
Immature Granulocytes: 0 %
Lymphocytes Relative: 24 %
Lymphs Abs: 2 10*3/uL (ref 0.7–4.0)
MCH: 29.1 pg (ref 26.0–34.0)
MCHC: 32.7 g/dL (ref 30.0–36.0)
MCV: 89.1 fL (ref 80.0–100.0)
Monocytes Absolute: 0.8 10*3/uL (ref 0.1–1.0)
Monocytes Relative: 9 %
Neutro Abs: 5.3 10*3/uL (ref 1.7–7.7)
Neutrophils Relative %: 65 %
Platelets: 368 10*3/uL (ref 150–400)
RBC: 4.12 MIL/uL — ABNORMAL LOW (ref 4.22–5.81)
RDW: 14.1 % (ref 11.5–15.5)
WBC: 8.3 10*3/uL (ref 4.0–10.5)
nRBC: 0 % (ref 0.0–0.2)

## 2021-11-25 LAB — COMPREHENSIVE METABOLIC PANEL
ALT: 9 U/L (ref 0–44)
AST: 17 U/L (ref 15–41)
Albumin: 3.4 g/dL — ABNORMAL LOW (ref 3.5–5.0)
Alkaline Phosphatase: 71 U/L (ref 38–126)
Anion gap: 7 (ref 5–15)
BUN: 20 mg/dL (ref 8–23)
CO2: 24 mmol/L (ref 22–32)
Calcium: 9.3 mg/dL (ref 8.9–10.3)
Chloride: 105 mmol/L (ref 98–111)
Creatinine, Ser: 1.01 mg/dL (ref 0.61–1.24)
GFR, Estimated: >60 mL/min (ref >60)
Glucose, Bld: 99 mg/dL (ref 70–99)
Potassium: 4.1 mmol/L (ref 3.5–5.1)
Sodium: 136 mmol/L (ref 135–145)
Total Bilirubin: 0.4 mg/dL (ref 0.3–1.2)
Total Protein: 7.9 g/dL (ref 6.5–8.1)

## 2021-11-25 MED ORDER — ASPIRIN 325 MG PO TABS: 162.0000 mg | ORAL_TABLET | Freq: Every day | ORAL | Status: DC

## 2021-11-25 MED ORDER — IOHEXOL 350 MG/ML SOLN
75.0000 mL | Freq: Once | INTRAVENOUS | Status: AC | PRN
  Administered 2021-11-25: 75 mL via INTRAVENOUS

## 2021-11-25 NOTE — ED Provider Notes (Signed)
?[facility] ?Provider Note ? ? ?CSN: [id number] ?Arrival date & time: 11/25/21  1545 ? ?  ? ?History ? ?Chief Complaint  ?Patient presents with  ? Eye Problem  ? ? ?Danny Frederick is a 88 y.o. male. ? ?Patient presents ER chief complaint of double vision.  Symptoms started around 1 PM today and have been persistent.  His daughter noticed that he was not able to see things well on his left side.  Otherwise denies any pain.  Denies any fall or trauma.  No weakness elsewhere.  No fever no cough no vomiting or diarrhea. ? ? ?  ? ?Home Medications ?Prior to Admission medications   ?Medication Sig Start Date End Date Taking? Authorizing Provider  ?amLODipine (NORVASC) 5 MG tablet TAKE 1 TABLET BY MOUTH  TWICE DAILY 12/17/20   Lelon Perla, MD  ?atorvastatin (LIPITOR) 10 MG tablet TAKE 1 TABLET BY MOUTH  DAILY 01/16/21   Rita Ohara, MD  ?Cholecalciferol (VITAMIN D3) 2000 units TABS Take 2,000 Units by mouth daily.     [provider]  ?citalopram (CELEXA) 20 MG tablet Take 1 tablet (20 mg total) by mouth daily. 03/12/20   Rita Ohara, MD  ?Ferrous Sulfate (IRON) 325 (65 Fe) MG TABS Take 1 tablet (325 mg total) by mouth every other day. Reported on 11/08/2015 12/28/18   Cathlyn Parsons, PA-C  ?irbesartan (AVAPRO) 300 MG tablet TAKE 1 TABLET BY MOUTH  DAILY 07/27/20   Lelon Perla, MD  ?metoprolol tartrate (LOPRESSOR) 25 MG tablet TAKE 1 TABLET BY MOUTH  TWICE DAILY 03/26/20   Lelon Perla, MD  ?omeprazole (PRILOSEC) 20 MG capsule TAKE 1 CAPSULE BY MOUTH  EVERY OTHER DAY 03/12/20   Rita Ohara, MD  ?[place] 1 application into both eyes daily as needed (dry eyes/irritation). OTC eye gel ?Patient not taking: Reported on 04/04/2021    [provider]  ?SYNTHROID 50 MCG tablet TAKE 1 TABLET BY MOUTH  DAILY BEFORE BREAKFAST 02/06/21   Rita Ohara, MD  ?   ? ?Allergies    ?Patient has no known allergies.   ? ?Review of Systems   ?Review of  Systems  ?Constitutional:  Negative for fever.  ?HENT:  Negative for ear pain and sore throat.   ?Eyes:  Negative for pain.  ?Respiratory:  Negative for cough.   ?Cardiovascular:  Negative for chest pain.  ?Gastrointestinal:  Negative for abdominal pain.  ?Genitourinary:  Negative for flank pain.  ?Musculoskeletal:  Negative for back pain.  ?Skin:  Negative for color change and rash.  ?Neurological:  Negative for syncope.  ?All other systems reviewed and are negative. ? ?Physical Exam ?Updated Vital Signs ?BP (!) 149/82   Pulse 73   Temp 98.7 ?F (37.1 ?C) (Oral)   Resp (!) 30   SpO2 98%  ?Physical Exam ?Constitutional:   ?   Appearance: He is well-developed.  ?HENT:  ?   Head: Normocephalic.  ?   Nose: Nose normal.  ?Eyes:  ?   Comments: Inability to medially rotate his right eye.  ?Cardiovascular:  ?   Rate and Rhythm: Normal rate.  ?Pulmonary:  ?   Effort: Pulmonary effort is normal.  ?Skin: ?   Coloration: Skin is not jaundiced.  ?Neurological:  ?   Mental Status: He is alert. Mental status is at baseline.  ?   Comments: Internuclear ophthalmoplegia present on the right eye.  Otherwise no facial droop noted. ? ?Strength  5/5 all other extremities.  ? ? ?ED Results / Procedures / Treatments   ?Labs ?(all labs ordered are listed, but only abnormal results are displayed) ?Labs Reviewed  ?COMPREHENSIVE METABOLIC PANEL - Abnormal; Notable for the following components:  ?    Result Value  ? Albumin 3.4 (*)   ? All other components within normal limits  ?CBC WITH DIFFERENTIAL/PLATELET - Abnormal; Notable for the following components:  ? RBC 4.12 (*)   ? Hemoglobin 12.0 (*)   ? HCT 36.7 (*)   ? All other components within normal limits  ? ? ?EKG ?None ? ?Radiology ?CT ANGIO HEAD NECK W WO CM ? ?Result Date: 11/25/2021 ?CLINICAL DATA:  Initial evaluation for diplopia. EXAM: CT ANGIOGRAPHY HEAD AND NECK TECHNIQUE: Multidetector CT imaging of the head and neck was performed using the standard protocol during bolus  administration of intravenous contrast. Multiplanar CT image reconstructions and MIPs were obtained to evaluate the vascular anatomy. Carotid stenosis measurements (when applicable) are obtained utilizing NASCET criteria, using the distal internal carotid diameter as the denominator. RADIATION DOSE REDUCTION: This exam was performed according to the departmental dose-optimization program which includes automated exposure control, adjustment of the mA and/or kV according to patient size and/or use of iterative reconstruction technique. CONTRAST:  49m OMNIPAQUE IOHEXOL 350 MG/ML SOLN COMPARISON:  Head CT from earlier the same day. FINDINGS: CTA NECK FINDINGS Aortic arch: Moderate to advanced atheromatous change about the visualized aortic arch. Arch itself is normal in caliber with normal branch pattern. No stenosis about the origin the great vessels. Right carotid system: Right CCA patent to the bifurcation without stenosis. Bulky plaque about the right carotid bulb/proximal right ICA with associated stenosis of up to 75% by NASCET criteria. Right ICA patent distally without stenosis or dissection. Left carotid system: Left CCA patent from its origin to the bifurcation without stenosis. Calcified plaque about the left carotid bulb/proximal left ICA with associated stenosis of up to 40-50% by NASCET criteria. Left ICA patent distally without stenosis or dissection. Vertebral arteries: Both vertebral arteries arise from the subclavian arteries. No proximal subclavian artery stenosis. Right vertebral artery dominant. Vertebral arteries patent without stenosis or dissection. Skeleton: No discrete or worrisome osseous lesions. Moderate multilevel cervical spondylosis noted. Other neck: No other acute soft tissue abnormality within the neck. Upper chest: Visualized upper chest demonstrates no acute finding. Review of the MIP images confirms the above findings CTA HEAD FINDINGS Anterior circulation: Petrous segments patent  bilaterally. Scattered atheromatous change about the carotid siphons with no more than mild multifocal narrowing. A1 segments patent bilaterally. Normal anterior communicating artery complex. Anterior cerebral arteries patent without stenosis. No M1 stenosis or occlusion. Normal MCA bifurcations. Distal MCA branches perfused and symmetric. Posterior circulation: Scattered atheromatous plaque present within the V4 segments bilaterally without high-grade stenosis. Both PICA patent. Basilar patent to its distal aspect without stenosis. Superior cerebellar and posterior cerebral arteries patent bilaterally. Venous sinuses: Patent allowing for timing the contrast bolus. Anatomic variants: None significant.  No aneurysm. Review of the MIP images confirms the above findings IMPRESSION: 1. Negative CTA for large vessel occlusion or other emergent finding. 2. Bulky plaque about the right carotid bulb/proximal right ICA with associated stenosis of up to 75% by NASCET criteria. 3. Calcified plaque about the left carotid bulb/proximal left ICA with associated stenosis of up to 40-50% by NASCET criteria. 4. Additional moderate atheromatous change about the aortic arch and carotid siphons, with no other hemodynamically significant or correctable stenosis. Electronically Signed  By: Jeannine Boga M.D.   On: 11/25/2021 21:04  ? ?CT HEAD WO CONTRAST (5MM) ? ?Result Date: 11/25/2021 ?CLINICAL DATA:  Diplopia in right eye for 2 days EXAM: CT HEAD WITHOUT CONTRAST TECHNIQUE: Contiguous axial images were obtained from the base of the skull through the vertex without intravenous contrast. RADIATION DOSE REDUCTION: This exam was performed according to the departmental dose-optimization program which includes automated exposure control, adjustment of the mA and/or kV according to patient size and/or use of iterative reconstruction technique. COMPARISON:  12/23/19 FINDINGS: Brain: No evidence of acute infarction, hemorrhage,  hydrocephalus, extra-axial collection or mass lesion/mass effect. There is moderate to severe diffuse low-attenuation within the subcortical and periventricular white matter compatible with chronic microvascular disease. Prom

## 2021-11-25 NOTE — ED Triage Notes (Signed)
Pt BIB [facility], c/o double vision in his right eye x 2 days. Vision worsens with distance, and improves when objects are closer. Hx TIA. No other neuro deficits noted at this time. Denies headache. ?

## 2021-11-25 NOTE — ED Provider Triage Note (Addendum)
Emergency Medicine Provider Triage Evaluation Note ? ?Danny Frederick , a 88 y.o. male  was evaluated in triage.  Pt complains of diplopia of right eye.  Reports that diplopia has been present in right eye over the last 2 days and constant over this time.  Patient denies any recent falls or head injuries.  Patient does not wear contacts. ? ?Denies any numbness, weakness, facial asymmetry, dysarthria, eye pain, eye discharge, neck pain, neck stiffness, facial swelling. ? ?Review of Systems  ?Positive: Diplopia right eye ?Negative: See above ? ?Physical Exam  ?BP 122/77 (BP Location: Right Arm)   Pulse 73   Temp 98.7 ?F (37.1 ?C) (Oral)   Resp 16   SpO2 95%  ?Gen:   Awake, no distress   ?Resp:  Normal effort  ?MSK:   Moves extremities without difficulty  ?Other:  Grip strength equal.  +5 strength to bilateral upper and lower extremities.  Sensation to light touch grossly intact to bilateral upper and lower extremities.  Right eye unable to move in a lmedial direction.  All other EOM's intact. ? ?Medical Decision Making  ?Medically screening exam initiated at 4:09 PM.  Appropriate orders placed.  Danny Frederick was informed that the remainder of the evaluation will be completed by another provider, this initial triage assessment does not replace that evaluation, and the importance of remaining in the ED until their evaluation is complete. ? ? ?  ?Loni Beckwith, PA-C ?11/25/21 1611 ? ?  ?Loni Beckwith, PA-C ?11/25/21 1614 ? ?

## 2021-11-26 ENCOUNTER — Other Ambulatory Visit (HOSPITAL_COMMUNITY): Payer: Medicare Other

## 2021-11-26 ENCOUNTER — Inpatient Hospital Stay (HOSPITAL_COMMUNITY): Payer: Medicare Other

## 2021-11-26 ENCOUNTER — Encounter (HOSPITAL_COMMUNITY): Payer: Self-pay | Admitting: Internal Medicine

## 2021-11-26 DIAGNOSIS — E785 Hyperlipidemia, unspecified: Secondary | ICD-10-CM

## 2021-11-26 DIAGNOSIS — I639 Cerebral infarction, unspecified: Secondary | ICD-10-CM

## 2021-11-26 DIAGNOSIS — E039 Hypothyroidism, unspecified: Secondary | ICD-10-CM | POA: Diagnosis not present

## 2021-11-26 DIAGNOSIS — I10 Essential (primary) hypertension: Secondary | ICD-10-CM

## 2021-11-26 DIAGNOSIS — I6381 Other cerebral infarction due to occlusion or stenosis of small artery: Secondary | ICD-10-CM

## 2021-11-26 DIAGNOSIS — K219 Gastro-esophageal reflux disease without esophagitis: Secondary | ICD-10-CM | POA: Diagnosis not present

## 2021-11-26 DIAGNOSIS — E782 Mixed hyperlipidemia: Secondary | ICD-10-CM

## 2021-11-26 DIAGNOSIS — I6521 Occlusion and stenosis of right carotid artery: Secondary | ICD-10-CM

## 2021-11-26 LAB — CBC WITH DIFFERENTIAL/PLATELET
Abs Immature Granulocytes: 0.03 10*3/uL (ref 0.00–0.07)
Basophils Absolute: 0.1 10*3/uL (ref 0.0–0.1)
Basophils Relative: 1 %
Eosinophils Absolute: 0.2 10*3/uL (ref 0.0–0.5)
Eosinophils Relative: 2 %
HCT: 40.3 % (ref 39.0–52.0)
Hemoglobin: 13 g/dL (ref 13.0–17.0)
Immature Granulocytes: 0 %
Lymphocytes Relative: 23 %
Lymphs Abs: 2.1 10*3/uL (ref 0.7–4.0)
MCH: 28.6 pg (ref 26.0–34.0)
MCHC: 32.3 g/dL (ref 30.0–36.0)
MCV: 88.8 fL (ref 80.0–100.0)
Monocytes Absolute: 0.9 10*3/uL (ref 0.1–1.0)
Monocytes Relative: 10 %
Neutro Abs: 5.9 10*3/uL (ref 1.7–7.7)
Neutrophils Relative %: 64 %
Platelets: 358 10*3/uL (ref 150–400)
RBC: 4.54 MIL/uL (ref 4.22–5.81)
RDW: 14.3 % (ref 11.5–15.5)
WBC: 9.2 10*3/uL (ref 4.0–10.5)
nRBC: 0 % (ref 0.0–0.2)

## 2021-11-26 LAB — COMPREHENSIVE METABOLIC PANEL
ALT: 10 U/L (ref 0–44)
AST: 20 U/L (ref 15–41)
Albumin: 3.3 g/dL — ABNORMAL LOW (ref 3.5–5.0)
Alkaline Phosphatase: 74 U/L (ref 38–126)
Anion gap: 10 (ref 5–15)
BUN: 12 mg/dL (ref 8–23)
CO2: 25 mmol/L (ref 22–32)
Calcium: 9.4 mg/dL (ref 8.9–10.3)
Chloride: 102 mmol/L (ref 98–111)
Creatinine, Ser: 0.77 mg/dL (ref 0.61–1.24)
GFR, Estimated: >60 mL/min (ref >60)
Glucose, Bld: 92 mg/dL (ref 70–99)
Potassium: 3.7 mmol/L (ref 3.5–5.1)
Sodium: 137 mmol/L (ref 135–145)
Total Bilirubin: 0.8 mg/dL (ref 0.3–1.2)
Total Protein: 8 g/dL (ref 6.5–8.1)

## 2021-11-26 LAB — LIPID PANEL
Cholesterol: 188 mg/dL (ref 0–200)
HDL: 64 mg/dL (ref >40)
LDL Cholesterol: 108 mg/dL — ABNORMAL HIGH (ref 0–99)
Total CHOL/HDL Ratio: 2.9 RATIO
Triglycerides: 78 mg/dL (ref <150)
VLDL: 16 mg/dL (ref 0–40)

## 2021-11-26 LAB — MAGNESIUM: Magnesium: 1.9 mg/dL (ref 1.7–2.4)

## 2021-11-26 LAB — HEMOGLOBIN A1C
Hgb A1c MFr Bld: 5.5 % (ref 4.8–5.6)
Mean Plasma Glucose: 111.15 mg/dL

## 2021-11-26 MED ORDER — ONDANSETRON HCL 4 MG/2ML IJ SOLN: 4.0000 mg | Freq: Four times a day (QID) | INTRAMUSCULAR | Status: DC | PRN

## 2021-11-26 MED ORDER — CITALOPRAM HYDROBROMIDE 10 MG PO TABS
20.0000 mg | ORAL_TABLET | Freq: Every day | ORAL | Status: DC
  Administered 2021-11-26 – 2021-11-29 (×4): 20 mg via ORAL
  Filled 2021-11-26 (×4): qty 2

## 2021-11-26 MED ORDER — ATORVASTATIN CALCIUM 40 MG PO TABS
40.0000 mg | ORAL_TABLET | Freq: Every day | ORAL | Status: DC
  Administered 2021-11-27 – 2021-11-29 (×3): 40 mg via ORAL
  Filled 2021-11-26 (×3): qty 1

## 2021-11-26 MED ORDER — CLOPIDOGREL BISULFATE 75 MG PO TABS
75.0000 mg | ORAL_TABLET | Freq: Every day | ORAL | Status: DC
  Administered 2021-11-26 – 2021-11-29 (×4): 75 mg via ORAL
  Filled 2021-11-26 (×4): qty 1

## 2021-11-26 MED ORDER — STROKE: EARLY STAGES OF RECOVERY BOOK: Freq: Once | Status: DC

## 2021-11-26 MED ORDER — ACETAMINOPHEN 325 MG PO TABS
650.0000 mg | ORAL_TABLET | Freq: Four times a day (QID) | ORAL | Status: DC | PRN
Start: 2021-11-26 — End: 2021-11-29
  Administered 2021-11-26 – 2021-11-28 (×4): 650 mg via ORAL
  Filled 2021-11-26 (×4): qty 2

## 2021-11-26 MED ORDER — LEVOTHYROXINE SODIUM 50 MCG PO TABS
50.0000 ug | ORAL_TABLET | Freq: Every day | ORAL | Status: DC
  Administered 2021-11-26 – 2021-11-29 (×4): 50 ug via ORAL
  Filled 2021-11-26: qty 2
  Filled 2021-11-26 (×3): qty 1

## 2021-11-26 MED ORDER — POLYETHYLENE GLYCOL 3350 17 G PO PACK: 17.0000 g | PACK | Freq: Every day | ORAL | Status: DC | PRN

## 2021-11-26 MED ORDER — ACETAMINOPHEN 650 MG RE SUPP: 650.0000 mg | Freq: Four times a day (QID) | RECTAL | Status: DC | PRN

## 2021-11-26 MED ORDER — PANTOPRAZOLE SODIUM 40 MG PO TBEC
40.0000 mg | DELAYED_RELEASE_TABLET | Freq: Every day | ORAL | Status: DC
  Administered 2021-11-26 – 2021-11-29 (×4): 40 mg via ORAL
  Filled 2021-11-26 (×4): qty 1

## 2021-11-26 MED ORDER — ATORVASTATIN CALCIUM 10 MG PO TABS
10.0000 mg | ORAL_TABLET | Freq: Every day | ORAL | Status: DC
  Administered 2021-11-26: 10 mg via ORAL
  Filled 2021-11-26: qty 1

## 2021-11-26 MED ORDER — HYDRALAZINE HCL 20 MG/ML IJ SOLN: 10.0000 mg | Freq: Four times a day (QID) | INTRAMUSCULAR | Status: DC | PRN

## 2021-11-26 MED ORDER — ASPIRIN EC 81 MG PO TBEC
81.0000 mg | DELAYED_RELEASE_TABLET | Freq: Every day | ORAL | Status: DC
  Administered 2021-11-26 – 2021-11-29 (×4): 81 mg via ORAL
  Filled 2021-11-26 (×4): qty 1

## 2021-11-26 MED ORDER — ENOXAPARIN SODIUM 40 MG/0.4ML IJ SOSY
40.0000 mg | PREFILLED_SYRINGE | INTRAMUSCULAR | Status: DC
  Administered 2021-11-26 – 2021-11-29 (×4): 40 mg via SUBCUTANEOUS
  Filled 2021-11-26 (×4): qty 0.4

## 2021-11-26 MED ORDER — METOPROLOL TARTRATE 25 MG PO TABS: 25.0000 mg | ORAL_TABLET | Freq: Two times a day (BID) | ORAL | Status: DC

## 2021-11-26 MED ORDER — IRBESARTAN 300 MG PO TABS
300.0000 mg | ORAL_TABLET | Freq: Every day | ORAL | Status: DC
  Administered 2021-11-26 – 2021-11-29 (×4): 300 mg via ORAL
  Filled 2021-11-26 (×4): qty 1

## 2021-11-26 MED ORDER — ONDANSETRON HCL 4 MG PO TABS: 4.0000 mg | ORAL_TABLET | Freq: Four times a day (QID) | ORAL | Status: DC | PRN

## 2021-11-26 NOTE — H&P (Signed)
? ?History and Physical  ? ? ?Patient: Danny Frederick MRN: [id number] DOA: 11/25/2021 ? ?Date of Service: the patient was seen and examined on 11/26/2021 ? ?Patient coming from: Home via EMS ? ?Chief Complaint:  ?Chief Complaint  ?Patient presents with  ? Eye Problem  ? ? ?HPI:  ? ?88 year old male with past medical history of acute hemorrhagic stroke involving the posterior left basal ganglia 12/2018, hypertension, benign prostatic hyperplasia, ascending aortic aneurysm status postrepair in 2015, hyperlipidemia and hypothyroidism who presents to [facility] via EMS with complaints of double vision. ? ?Patient explains that on Saturday yesterday he was eating lunch when he attempted to reach for the juice on the table but found out that he could not due to a sudden visual change.  Patient noticed that he was seeing double and I was not able to see objects appropriately on his left side.  Patient denies any associated headaches, facial droop, unsteady gait, focal weakness or slurred speech. ? ?Patient allowed his symptoms to persist throughout the day and the following day until Monday.   When symptoms were unrelenting EMS was contacted and promptly came to evaluate the patient and brought him into [facility] emergency department for evaluation. ? ?Upon evaluation in the emergency department due to identified neurologic deficits on exam MRI brain was obtained revealing an acute right basal ganglia infarct.  ER provider then discussed the case with Dr. Lorrin Goodell with neurology who agreed to see the patient in consultation.  The hospitalist group was then called to assess the patient for admission the hospital. ? ? ? ?Review of Systems: Review of Systems  ?Eyes:  Positive for double vision.  ?Neurological:  Positive for weakness.  ?All other systems reviewed and are negative. ? ? ?Past Medical History:  ?Diagnosis Date  ? Allergic conjunctivitis   ? Allergic rhinitis, cause unspecified   ? on allergy  shots (Dr. Orvil Feil)  ? Atherosclerosis of aorta ([place])   ? noted on CT angio of abd/pelvis, in many vessels  ? BPH (benign prostatic hypertrophy)   ? Diverticulosis of colon 12/09  ? Foot drop, right   ? GERD (gastroesophageal reflux disease)   ? Hearing loss in left ear   ? both ears now  ? History of Clostridium difficile   ? multiple times 2016--s/p fecal transplant (Dr. Baxter Flattery).  ? Hx of echocardiogram   ? Echo (03/2014):  Mild LVH, EF 50-55%, no RWMA, Gr 1 DD, mild MR, mild reduced RVSF  ? Hypertension   ? Internal hemorrhoids   ? Iron deficiency anemia, unspecified 6/09  ? Mitral valve prolapse   ? h/o; normal echo 12/2011 with no MVP seen  ? 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 Right ventricular dysfunction 01/23/2014  ? Secondary to obstruction of main pulmonary artery  ? Ruptured thoracic aortic aneurysm 01/23/2014  ? S/P ascending aortic aneurysm repair 01/24/2014  ? Unspecified hypothyroidism   ? ? ?Past Surgical History:  ?Procedure Laterality Date  ? CATARACT EXTRACTION, BILATERAL Bilateral april/may 2021  ? [place]  ? C5-6  ? COLONOSCOPY WITH PROPOFOL N/A 11/09/2014  ? Procedure: COLONOSCOPY WITH PROPOFOL;  Surgeon: Jerene Bears, MD;  Location: [facility];  Service: Gastroenterology;  Laterality: N/A;  ? ESOPHAGOGASTRODUODENOSCOPY  10/31/08  ? normal; Dr. Edison Nasuti  ? FECAL TRANSPLANT N/A 11/09/2014  ? Procedure: FECAL TRANSPLANT;  Surgeon: Jerene Bears, MD;  Location: [facility];  Service: Gastroenterology;  Laterality: N/A;  ? HEMORRHOIDECTOMY WITH HEMORRHOID BANDING  04/07/13  ? hemorroidal banding  04/07/2013  ? x3-Dr.Eric Redmond Pulling  ? PROSTATE SURGERY  2007  ? photovaporization  ? ROTATOR CUFF REPAIR  10/2008  ? left; Dr. Onnie Graham  ? [place] SURGERY  2006  ? L4-5 disk surgery  ? SPINE SURGERY  04/2013  ? L4-5, L5-S1 fusion.  Dr. Christella Noa  ? THORACIC AORTIC ANEURYSM REPAIR N/A 01/23/2014  ? Procedure: THORACIC ASCENDING ANEURYSM REPAIR (AAA);  Surgeon: Rexene Alberts, MD;  Location: [place];   Service: Open Heart Surgery;  Laterality: N/A;  ? TONSILLECTOMY    ? ? ?Social History:  reports that he quit smoking about 44 years ago. His smoking use included cigarettes. He has never used smokeless tobacco. He reports current alcohol use. He reports that he does not use drugs. ? ?No Known Allergies ? ?Family History  ?Problem Relation Age of Onset  ? Diabetes Mother   ? Heart disease Mother   ?     CHF  ? Hypertension Mother   ? Depression Mother   ? Stroke Father   ? Parkinsonism Father   ? Hypertension Father   ? Cancer Brother   ?     bladder cancer  ? Cancer Brother   ?     prostate cancer  ? Emphysema Brother   ? Diabetes Sister   ? COPD Sister   ? Colon cancer Neg Hx   ? ? ?Prior to Admission medications   ?Medication Sig Start Date End Date Taking? Authorizing Provider  ?amLODipine (NORVASC) 5 MG tablet TAKE 1 TABLET BY MOUTH  TWICE DAILY 12/17/20   Lelon Perla, MD  ?atorvastatin (LIPITOR) 10 MG tablet TAKE 1 TABLET BY MOUTH  DAILY 01/16/21   Rita Ohara, MD  ?Cholecalciferol (VITAMIN D3) 2000 units TABS Take 2,000 Units by mouth daily.     [provider]  ?citalopram (CELEXA) 20 MG tablet Take 1 tablet (20 mg total) by mouth daily. 03/12/20   Rita Ohara, MD  ?Ferrous Sulfate (IRON) 325 (65 Fe) MG TABS Take 1 tablet (325 mg total) by mouth every other day. Reported on 11/08/2015 12/28/18   Cathlyn Parsons, PA-C  ?irbesartan (AVAPRO) 300 MG tablet TAKE 1 TABLET BY MOUTH  DAILY 07/27/20   Lelon Perla, MD  ?metoprolol tartrate (LOPRESSOR) 25 MG tablet TAKE 1 TABLET BY MOUTH  TWICE DAILY 03/26/20   Lelon Perla, MD  ?omeprazole (PRILOSEC) 20 MG capsule TAKE 1 CAPSULE BY MOUTH  EVERY OTHER DAY 03/12/20   Rita Ohara, MD  ?[place] 1 application into both eyes daily as needed (dry eyes/irritation). OTC eye gel ?Patient not taking: Reported on 04/04/2021    [provider]  ?SYNTHROID 50 MCG tablet TAKE 1 TABLET BY MOUTH  DAILY BEFORE BREAKFAST 02/06/21   Rita Ohara, MD  ? ? ?Physical Exam: ? ?Vitals:  ? 11/26/21 0015 11/26/21 0100 11/26/21 0145 11/26/21 0230  ?BP:    (!) 155/115  ?Pulse: 76 97 70 100  ?Resp:    19  ?Temp:      ?TempSrc:      ?SpO2: (!) 89% 95% 95% 92%  ? ? ?Constitutional: Awake alert and oriented x3, no associated distress.   ?Skin: no rashes, no lesions, good skin turgor noted. ?Eyes: Pupils are equally reactive to light.  No evidence of scleral icterus or conjunctival pallor.  ?ENMT: Moist mucous membranes noted.  Posterior pharynx clear of any exudate or lesions.   ?Neck: normal, supple, no masses,  no thyromegaly.  No evidence of jugular venous distension.   ?Respiratory: clear to auscultation bilaterally, no wheezing, no crackles. Normal respiratory effort. No accessory muscle use.  ?Cardiovascular: Regular rate and rhythm, no murmurs / rubs / gallops. No extremity edema. 2+ pedal pulses. No carotid bruits.  ?Chest:   Nontender without crepitus or deformity.   ?Back:   Nontender without crepitus or deformity. ?Abdomen: Abdomen is soft and nontender.  No evidence of intra-abdominal masses.  Positive bowel sounds noted in all quadrants.   ?Musculoskeletal: No joint deformity upper and lower extremities. Good ROM, no contractures. Normal muscle tone.  ?Neurologic: Evidence of internuclear opathlmoplegia.  Othwerwise, remainder of CN 2-12 grossly intact. Sensation intact.  Patient moving all 4 extremities spontaneously.  Patient is following all commands.  Patient is responsive to verbal stimuli.   ?Psychiatric: Patient exhibits normal mood with appropriate affect.  Patient seems to possess insight as to their current situation.   ? ?Data Reviewed: ? ?I have personally reviewed and interpreted labs, imaging. ? ?Significant findings are hemoglobin 12.0, white blood cell count 8.3. ? ?MRI brain without contrast performed revealed 7 mm acute ischemic nonhemorrhagic anterior right basal ganglia infarct with additional punctate 4 mm acute ischemic  nonhemorrhagic right dorsal pontine infarct. ? ?EKG: Personally reviewed.  Rhythm is normal sinus rhythm with heart rate of 82 bpm.  PACs noted.  no dynamic ST segment changes appreciated. ? ? ?Assessment and Plan:

## 2021-11-26 NOTE — Progress Notes (Signed)
Carotid duplex bilateral study completed. ? ?Preliminary results relayed to Scot Dock, MD. ? ?See CV Proc for preliminary results report.  ? ?Darlin Coco, RDMS, RVT ? ?

## 2021-11-26 NOTE — Plan of Care (Signed)
  Problem: Education: Goal: Knowledge of General Education information will improve Description Including pain rating scale, medication(s)/side effects and non-pharmacologic comfort measures Outcome: Progressing   

## 2021-11-26 NOTE — Assessment & Plan Note (Signed)
?   Holding home antihypertensive therapy for brief period of time for permissive hypertension ?? Home antihypertensive therapy will be gradually reinitiated in the next 24 to 48 hours ?? As needed intravenous antihypertensives for markedly elevated blood pressures. ?

## 2021-11-26 NOTE — Assessment & Plan Note (Signed)
.   Resume home regimen of Synthroid 

## 2021-11-26 NOTE — Assessment & Plan Note (Signed)
Continuing home regimen of daily PPI therapy.  

## 2021-11-26 NOTE — Consult Note (Signed)
NEUROLOGY CONSULTATION NOTE  ? ?Date of service: November 26, 2021 ?Patient Name: Danny Frederick ?MRN:  [id number] ?DOB:  April 05, 1934 ?Reason for consult: "[place]ipl[place]" ?Requesting Provider: Vernelle Emerald, MD ?_ _ _   _ __   _ __ _ _  __ __   _ __   __ _ ? ?History of Present Illness  ?Danny Frederick is a 88 y.o. male with PMH significant for BPH, HTN, GERD, ruptured aortic aneurysm s/p repair, prior left basal ganglia ICH 2/2 hypertension. He presents with double vision x 2 days. ? ?Reports double vision when he looks to his left. No prior similar episode. MRI Brain demonstrated 7 mm acute ischemic nonhemorrhagic anterior right basal ganglia infarct. Additional punctate 4 mm acute ischemic nonhemorrhagic right dorsal pontine infarct.  ? ? ?LKW: 11/23/21 ?mRS: 3 ?tNKASE: not offered, outside window and hx of ICH ?Thrombectomy: no LVO ?NIHSS components Score: Comment  ?1a Level of Conscious 0'[x]'$  1'[]'$  2'[]'$  3'[]'$      ?1b LOC Questions 0'[x]'$  1'[]'$  2'[]'$       ?1c LOC Commands 0'[x]'$  1'[]'$  2'[]'$       ?2 Best Gaze 0'[x]'$  1'[]'$  2'[]'$       ?3 Visual 0'[x]'$  1'[]'$  2'[]'$  3'[]'$      ?4 Facial Palsy 0'[x]'$  1'[]'$  2'[]'$  3'[]'$      ?5a Motor Arm - left 0'[x]'$  1'[]'$  2'[]'$  3'[]'$  4'[]'$  UN'[]'$    ?5b Motor Arm - Right 0'[x]'$  1'[]'$  2'[]'$  3'[]'$  4'[]'$  UN'[]'$    ?6a Motor Leg - Left 0'[x]'$  1'[]'$  2'[]'$  3'[]'$  4'[]'$  UN'[]'$    ?6b Motor Leg - Right 0'[x]'$  1'[]'$  2'[]'$  3'[]'$  4'[]'$  UN'[]'$    ?7 Limb Ataxia 0'[x]'$  1'[]'$  2'[]'$  3'[]'$  UN'[]'$     ?8 Sensory 0'[x]'$  1'[]'$  2'[]'$  UN'[]'$      ?9 Best Language 0'[x]'$  1'[]'$  2'[]'$  3'[]'$      ?10 Dysarthria 0'[x]'$  1'[]'$  2'[]'$  UN'[]'$      ?11 Extinct. and Inattention 0'[x]'$  1'[]'$  2'[]'$       ?TOTAL: 0   ? ?  ?ROS  ? ?Constitutional Denies weight loss, fever and chills.   ?HEENT endorses changes in vision as above, no problems with hearing.   ?Respiratory Denies SOB and cough.   ?CV Denies palpitations and CP   ?GI Denies abdominal pain, nausea, vomiting and diarrhea.   ?GU Denies dysuria and urinary frequency.   ?MSK Denies myalgia and joint pain.   ?Skin Denies rash and pruritus.   ?Neurological Denies headache and syncope.   ?Psychiatric Denies  recent changes in mood. Denies anxiety and depression.   ? ?Past History  ? ?Past Medical History:  ?Diagnosis Date  ? Allergic conjunctivitis   ? Allergic rhinitis, cause unspecified   ? on allergy shots (Dr. Orvil Feil)  ? Atherosclerosis of aorta ([place])   ? noted on CT angio of abd/pelvis, in many vessels  ? BPH (benign prostatic hypertrophy)   ? Diverticulosis of colon 12/09  ? Foot drop, right   ? GERD (gastroesophageal reflux disease)   ? Hearing loss in left ear   ? both ears now  ? History of Clostridium difficile   ? multiple times 2016--s/p fecal transplant (Dr. Baxter Flattery).  ? Hx of echocardiogram   ? Echo (03/2014):  Mild LVH, EF 50-55%, no RWMA, Gr 1 DD, mild MR, mild reduced RVSF  ? Hypertension   ? Internal hemorrhoids   ? Iron deficiency anemia, unspecified 6/09  ? Mitral valve prolapse   ? h/o; normal echo 12/2011 with no MVP seen  ? 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 Right ventricular dysfunction  01/23/2014  ? Secondary to obstruction of main pulmonary artery  ? Ruptured thoracic aortic aneurysm 01/23/2014  ? S/P ascending aortic aneurysm repair 01/24/2014  ? Unspecified hypothyroidism   ? ?Past Surgical History:  ?Procedure Laterality Date  ? CATARACT EXTRACTION, BILATERAL Bilateral april/may 2021  ? [place]  ? C5-6  ? COLONOSCOPY WITH PROPOFOL N/A 11/09/2014  ? Procedure: COLONOSCOPY WITH PROPOFOL;  Surgeon: Jerene Bears, MD;  Location: [facility];  Service: Gastroenterology;  Laterality: N/A;  ? ESOPHAGOGASTRODUODENOSCOPY  10/31/08  ? normal; Dr. Edison Nasuti  ? FECAL TRANSPLANT N/A 11/09/2014  ? Procedure: FECAL TRANSPLANT;  Surgeon: Jerene Bears, MD;  Location: [facility];  Service: Gastroenterology;  Laterality: N/A;  ? HEMORRHOIDECTOMY WITH HEMORRHOID BANDING  04/07/13  ? hemorroidal banding  04/07/2013  ? x3-Dr.Eric Redmond Pulling  ? PROSTATE SURGERY  2007  ? photovaporization  ? ROTATOR CUFF REPAIR  10/2008  ? left; Dr. Onnie Graham  ? [place] SURGERY  2006  ? L4-5 disk surgery  ? SPINE SURGERY   04/2013  ? L4-5, L5-S1 fusion.  Dr. Christella Noa  ? THORACIC AORTIC ANEURYSM REPAIR N/A 01/23/2014  ? Procedure: THORACIC ASCENDING ANEURYSM REPAIR (AAA);  Surgeon: Rexene Alberts, MD;  Location: [place];  Service: Open Heart Surgery;  Laterality: N/A;  ? TONSILLECTOMY    ? ?Family History  ?Problem Relation Age of Onset  ? Diabetes Mother   ? Heart disease Mother   ?     CHF  ? Hypertension Mother   ? Depression Mother   ? Stroke Father   ? Parkinsonism Father   ? Hypertension Father   ? Cancer Brother   ?     bladder cancer  ? Cancer Brother   ?     prostate cancer  ? Emphysema Brother   ? Diabetes Sister   ? COPD Sister   ? Colon cancer Neg Hx   ? ?Social History  ? ?Socioeconomic History  ? Marital status: Married  ?  Spouse name: Not on file  ? Number of children: 2  ? Years of education: Not on file  ? Highest education level: Not on file  ?Occupational History  ? Not on file  ?Tobacco Use  ? Smoking status: Former  ?  Types: Cigarettes  ?  Quit date: 09/01/1977  ?  Years since quitting: 44.2  ? Smokeless tobacco: Never  ?Vaping Use  ? Vaping Use: Never used  ?Substance and Sexual Activity  ? Alcohol use: Yes  ?  Comment: 2 glasses wine per day (small)  ? Drug use: No  ? Sexual activity: Not on file  ?Other Topics Concern  ? Not on file  ?Social History Narrative  ? Lives with wife. 1 daughter in [place], 1 daughter in [place], [place].  ? Getting stairlift installed 01/2019  ? 5 grandchildren.  ? ?Social Determinants of Health  ? ?Financial Resource Strain: Not on file  ?Food Insecurity: Not on file  ?Transportation Needs: Not on file  ?Physical Activity: Not on file  ?Stress: Not on file  ?Social Connections: Not on file  ? ?No Known Allergies ? ?Medications  ?(Not in a hospital admission) ?  ? ?Vitals  ? ?Vitals:  ? 11/25/21 1604 11/25/21 1845 11/25/21 1915 11/25/21 1945  ?BP: 122/77 (!) 140/93 135/77 (!) 149/82  ?Pulse: 73 82 73 73  ?Resp: 16 (!) 30 (!) 23 (!) 30  ?Temp: 98.7 ?F (37.1 ?C)     ?TempSrc: Oral      ?  SpO2: 95% 95% 98% 98%  ?  ? ?There is no height or weight on file to calculate BMI. ? ?Physical Exam  ? ?General: Laying comfortably in bed; in no acute distress.  ?HENT: Normal oropharynx and mucosa. Normal external appearance of ears and nose.  ?Neck: Supple, no pain or tenderness  ?CV: No JVD. No peripheral edema.  ?Pulmonary: Symmetric Chest rise. Normal respiratory effort.  ?Abdomen: Soft to touch, non-tender.  ?Ext: No cyanosis, edema, or deformity  ?Skin: No rash. Normal palpation of skin.   ?Musculoskeletal: Normal digits and nails by inspection. No clubbing.  ? ?Neurologic Examination  ?Mental status/Cognition: Alert, oriented to self, place, month and year, good attention.  ?Speech/language: Fluent, comprehension intact, object naming intact, repetition intact.  ?Cranial nerves:  ? CN II Pupils equal and reactive to light, no VF deficits   ? CN III,IV,VI Right INO, no gaze preference or deviation, no nystagmus   ? CN V normal sensation in V1, V2, and V3 segments bilaterally   ? CN VII no asymmetry, no nasolabial fold flattening   ? CN VIII normal hearing to speech   ? CN IX & X normal palatal elevation, no uvular deviation   ? CN XI 5/5 head turn and 5/5 shoulder shrug bilaterally   ? CN XII midline tongue protrusion   ? ?Motor:  ?Muscle bulk: normal, tone normal, pronator drift none tremor none ?Mvmt Root Nerve  Muscle Right Left Comments  ?SA C5/6 Ax Deltoid 5 5   ?EF C5/6 Mc Biceps 5 5   ?EE C6/7/8 Rad Triceps 5 5   ?WF C6/7 Med FCR     ?WE C7/8 PIN ECU     ?F Ab C8/T1 U ADM/FDI 5 5   ?HF L1/2/3 Fem Illopsoas 5 5   ?KE L2/3/4 Fem Quad 5 5   ?DF L4/5 D Peron Tib Ant 5 5   ?PF S1/2 Tibial Grc/Sol 5 5   ? ?Sensation: ? Light touch Intact throughout  ? Pin prick   ? Temperature   ? Vibration   ?Proprioception   ? ?Coordination/Complex Motor:  ?- Finger to Nose intact  BL ?- Heel to shin unable to do ?- Rapid alternating movement are slowed BL ?- Gait: deferred for patient safety. ? ?Labs  ? ?CBC:  ?Recent  Labs  ?Lab 11/25/21 ?1619  ?WBC 8.3  ?NEUTROABS 5.3  ?HGB 12.0*  ?HCT 36.7*  ?MCV 89.1  ?PLT 368  ? ? ?Basic Metabolic Panel:  ?Lab Results  ?Component Value Date  ? NA 136 11/25/2021  ? K 4.1 11/25/2021  ?

## 2021-11-26 NOTE — Assessment & Plan Note (Signed)
?   Continuing home regimen of lipid lowering therapy. ?? Obtaining lipid panel and will titrate statin therapy as necessary to maintain LDL less than 70 ? ?

## 2021-11-26 NOTE — Progress Notes (Addendum)
STROKE TEAM PROGRESS NOTE  ? ?INTERVAL HISTORY ?Patient is seen in his room with no family at the bedside.  Yesterday, he presented to the ED with new onset diplopia when looking to the left which has been present for two days.  MRI demonstrates small ischemic infarcts in right basal ganglia and dorsal pons.  He presented outside the window for TNK, and thrombectomy was not done as he had no LVO. ? ?Vitals:  ? 11/26/21 1145 11/26/21 1200 11/26/21 1215 11/26/21 1230  ?BP: 106/65 120/67 125/69 106/66  ?Pulse: 71 67 76 75  ?Resp: 15 (!) 27 20 (!) 23  ?Temp:      ?TempSrc:      ?SpO2: 95% 94% 93% 93%  ? ?CBC:  ?Recent Labs  ?Lab 11/25/21 ?1619 11/26/21 ?3846  ?WBC 8.3 9.2  ?NEUTROABS 5.3 5.9  ?HGB 12.0* 13.0  ?HCT 36.7* 40.3  ?MCV 89.1 88.8  ?PLT 368 358  ? ?Basic Metabolic Panel:  ?Recent Labs  ?Lab 11/25/21 ?1619 11/26/21 ?6599  ?NA 136 137  ?K 4.1 3.7  ?CL 105 102  ?CO2 24 25  ?GLUCOSE 99 92  ?BUN 20 12  ?CREATININE 1.01 0.77  ?CALCIUM 9.3 9.4  ?MG  --  1.9  ? ?Lipid Panel:  ?Recent Labs  ?Lab 11/26/21 ?3570  ?CHOL 188  ?TRIG 78  ?HDL 64  ?CHOLHDL 2.9  ?VLDL 16  ?LDLCALC 108*  ? ?HgbA1c:  ?Recent Labs  ?Lab 11/26/21 ?1779  ?HGBA1C 5.5  ? ?Urine Drug Screen: No results for input(s): LABOPIA, COCAINSCRNUR, LABBENZ, AMPHETMU, THCU, LABBARB in the last 168 hours.  ?Alcohol Level No results for input(s): ETH in the last 168 hours. ? ?IMAGING past 24 hours ?CT ANGIO HEAD NECK W WO CM ? ?Result Date: 11/25/2021 ?CLINICAL DATA:  Initial evaluation for diplopia. EXAM: CT ANGIOGRAPHY HEAD AND NECK TECHNIQUE: Multidetector CT imaging of the head and neck was performed using the standard protocol during bolus administration of intravenous contrast. Multiplanar CT image reconstructions and MIPs were obtained to evaluate the vascular anatomy. Carotid stenosis measurements (when applicable) are obtained utilizing NASCET criteria, using the distal internal carotid diameter as the denominator. RADIATION DOSE REDUCTION: This exam was  performed according to the departmental dose-optimization program which includes automated exposure control, adjustment of the mA and/or kV according to patient size and/or use of iterative reconstruction technique. CONTRAST:  38m OMNIPAQUE IOHEXOL 350 MG/ML SOLN COMPARISON:  Head CT from earlier the same day. FINDINGS: CTA NECK FINDINGS Aortic arch: Moderate to advanced atheromatous change about the visualized aortic arch. Arch itself is normal in caliber with normal branch pattern. No stenosis about the origin the great vessels. Right carotid system: Right CCA patent to the bifurcation without stenosis. Bulky plaque about the right carotid bulb/proximal right ICA with associated stenosis of up to 75% by NASCET criteria. Right ICA patent distally without stenosis or dissection. Left carotid system: Left CCA patent from its origin to the bifurcation without stenosis. Calcified plaque about the left carotid bulb/proximal left ICA with associated stenosis of up to 40-50% by NASCET criteria. Left ICA patent distally without stenosis or dissection. Vertebral arteries: Both vertebral arteries arise from the subclavian arteries. No proximal subclavian artery stenosis. Right vertebral artery dominant. Vertebral arteries patent without stenosis or dissection. Skeleton: No discrete or worrisome osseous lesions. Moderate multilevel cervical spondylosis noted. Other neck: No other acute soft tissue abnormality within the neck. Upper chest: Visualized upper chest demonstrates no acute finding. Review of the MIP images confirms the above  findings CTA HEAD FINDINGS Anterior circulation: Petrous segments patent bilaterally. Scattered atheromatous change about the carotid siphons with no more than mild multifocal narrowing. A1 segments patent bilaterally. Normal anterior communicating artery complex. Anterior cerebral arteries patent without stenosis. No M1 stenosis or occlusion. Normal MCA bifurcations. Distal MCA branches  perfused and symmetric. Posterior circulation: Scattered atheromatous plaque present within the V4 segments bilaterally without high-grade stenosis. Both PICA patent. Basilar patent to its distal aspect without stenosis. Superior cerebellar and posterior cerebral arteries patent bilaterally. Venous sinuses: Patent allowing for timing the contrast bolus. Anatomic variants: None significant.  No aneurysm. Review of the MIP images confirms the above findings IMPRESSION: 1. Negative CTA for large vessel occlusion or other emergent finding. 2. Bulky plaque about the right carotid bulb/proximal right ICA with associated stenosis of up to 75% by NASCET criteria. 3. Calcified plaque about the left carotid bulb/proximal left ICA with associated stenosis of up to 40-50% by NASCET criteria. 4. Additional moderate atheromatous change about the aortic arch and carotid siphons, with no other hemodynamically significant or correctable stenosis. Electronically Signed   By: Jeannine Boga M.D.   On: 11/25/2021 21:04  ? ?CT HEAD WO CONTRAST (5MM) ? ?Result Date: 11/25/2021 ?CLINICAL DATA:  Diplopia in right eye for 2 days EXAM: CT HEAD WITHOUT CONTRAST TECHNIQUE: Contiguous axial images were obtained from the base of the skull through the vertex without intravenous contrast. RADIATION DOSE REDUCTION: This exam was performed according to the departmental dose-optimization program which includes automated exposure control, adjustment of the mA and/or kV according to patient size and/or use of iterative reconstruction technique. COMPARISON:  12/23/19 FINDINGS: Brain: No evidence of acute infarction, hemorrhage, hydrocephalus, extra-axial collection or mass lesion/mass effect. There is moderate to severe diffuse low-attenuation within the subcortical and periventricular white matter compatible with chronic microvascular disease. Prominence of the sulci and ventricles compatible with brain atrophy. Vascular: No hyperdense vessel or  unexpected calcification. Skull: Normal. Negative for fracture or focal lesion. Sinuses/Orbits: No acute finding. Other: None IMPRESSION: 1. No acute intracranial abnormalities. 2. Chronic small vessel ischemic disease and brain atrophy. Electronically Signed   By: Kerby Moors M.D.   On: 11/25/2021 17:02  ? ?MR BRAIN WO CONTRAST ? ?Result Date: 11/25/2021 ?CLINICAL DATA:  Initial evaluation for neuro deficit, stroke suspected. EXAM: MRI HEAD WITHOUT CONTRAST TECHNIQUE: Multiplanar, multiecho pulse sequences of the brain and surrounding structures were obtained without intravenous contrast. COMPARISON:  Prior CTs from earlier the same day. FINDINGS: Brain: Generalized age-related cerebral atrophy. Patchy and confluent T2/FLAIR hyperintensity involving the periventricular deep white matter both cerebral hemispheres most consistent with chronic small vessel ischemic disease, moderately advanced in nature. 7 mm acute ischemic infarct seen at the anterior right basal ganglia (series 2, image 30). Additional probable punctate 4 mm acute ischemic infarct noted at the right dorsal pons, involving the periaqueductal gray matter (series 2, image 17). No associated hemorrhage or mass effect. No other evidence for acute or subacute ischemia. Gray-white matter differentiation otherwise maintained. No acute intracranial hemorrhage. Multiple scattered chronic micro hemorrhages noted, most likely related to chronic poorly controlled hypertension. No mass lesion or midline shift. No hydrocephalus or extra-axial fluid collection. Pituitary gland suprasellar region normal. Midline structures intact. Vascular: Major intracranial vascular flow voids are maintained. Skull and upper cervical spine: Craniocervical junction within normal limits. Bone marrow signal intensity normal. No scalp soft tissue abnormality. Sinuses/Orbits: Prior bilateral ocular lens replacement. Paranasal sinuses are largely clear. No mastoid effusion. Other:  None. IMPRESSION: 1. 7  mm acute ischemic nonhemorrhagic anterior right basal ganglia infarct. 2. Additional punctate 4 mm acute ischemic nonhemorrhagic right dorsal pontine infarct. 3. Age-related cerebral atroph

## 2021-11-26 NOTE — Consult Note (Addendum)
?Hospital Consult ? ?VASCULAR SURGERY ASSESSMENT & PLAN:  ? ?SYMPTOMATIC RIGHT CAROTID STENOSIS: This patient has a tight right ICA stenosis and had a right brain stroke.  His MRI shows a 7 mm acute ischemic nonhemorrhagic anterior right basal ganglion infarct and a punctate 4 mm acute ischemic nonhemorrhagic right dorsal pontine infarct.  He has a moderate calcified left carotid stenosis also.  An echo has been ordered.  He has been started on aspirin and Plavix.  He is on a statin.  I will obtain a carotid duplex scan to further evaluate the severity of his disease given the calcific nature of his plaques. ? ?I had a long discussion with the patient, his wife, and his daughter.  I think he would be a reasonable candidate for right carotid endarterectomy in order to lower his risk of future stroke if his echo looks good and medicine feels that he is acceptable candidate.  I could potentially do this Friday.  However, given his age and somewhat debilitated state if he elects not to have surgery I do not think this is unreasonable.  He has been started on aspirin and Plavix which the daughter tells me is new.  Vascular surgery will continue to follow. ? ?Gae Gallop, MD ?2:48 PM ? ? ?Reason for Consult:  Carotid stenosis and stroke ?Requesting Physician:   ?MRN #:  [id number] ? ?History of Present Illness: This is a 88 y.o. male with pertinent past medical history of hypertension, GERD, stroke,HLD and ruptured Aortic aneurysm s/p repair who presented to ED yesterday from his assisted living facility. History obtained mostly from patients Daughter and medical records as patient asleep after being up all night. She explains that on Saturday she went to see her father and noticed that his gaze did not look right. She also noticed that when he was eating and was reaching for food on his table he was unable to see it. She explains that he refused to go to the hospital on Saturday. Then again yesterday, she noticed that  his symptoms were unchanged and that he continued to have double vision so she contacted EMS. She reports no other neurological deficits such as slurred speech, facial drooping, weakness or numbness of his extremities. Upon presentation MRI of brain and CT head and neck were obtained. MRI demonstrated acute ischemic non hemorrhagic anterior right basal ganglia infarct and punctate right dorsal pontine infarct. CT head and neck showing Right ICA bulb stenosis of 75%. Vascular surgery was consulted for evaluation of ICA stenosis ? ?Past Medical History:  ?Diagnosis Date  ? Allergic conjunctivitis   ? Allergic rhinitis, cause unspecified   ? on allergy shots (Dr. Orvil Feil)  ? Atherosclerosis of aorta ([place])   ? noted on CT angio of abd/pelvis, in many vessels  ? BPH (benign prostatic hypertrophy)   ? Diverticulosis of colon 12/09  ? Foot drop, right   ? GERD (gastroesophageal reflux disease)   ? Hearing loss in left ear   ? both ears now  ? History of Clostridium difficile   ? multiple times 2016--s/p fecal transplant (Dr. Baxter Flattery).  ? Hx of echocardiogram   ? Echo (03/2014):  Mild LVH, EF 50-55%, no RWMA, Gr 1 DD, mild MR, mild reduced RVSF  ? Hypertension   ? Internal hemorrhoids   ? Iron deficiency anemia, unspecified 6/09  ? Mitral valve prolapse   ? h/o; normal echo 12/2011 with no MVP seen  ? 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 Right ventricular dysfunction  01/23/2014  ? Secondary to obstruction of main pulmonary artery  ? Ruptured thoracic aortic aneurysm 01/23/2014  ? S/P ascending aortic aneurysm repair 01/24/2014  ? Unspecified hypothyroidism   ? ? ?Past Surgical History:  ?Procedure Laterality Date  ? CATARACT EXTRACTION, BILATERAL Bilateral april/may 2021  ? [place]  ? C5-6  ? COLONOSCOPY WITH PROPOFOL N/A 11/09/2014  ? Procedure: COLONOSCOPY WITH PROPOFOL;  Surgeon: Jerene Bears, MD;  Location: [facility];  Service: Gastroenterology;  Laterality: N/A;  ? ESOPHAGOGASTRODUODENOSCOPY   10/31/08  ? normal; Dr. Edison Nasuti  ? FECAL TRANSPLANT N/A 11/09/2014  ? Procedure: FECAL TRANSPLANT;  Surgeon: Jerene Bears, MD;  Location: [facility];  Service: Gastroenterology;  Laterality: N/A;  ? HEMORRHOIDECTOMY WITH HEMORRHOID BANDING  04/07/13  ? hemorroidal banding  04/07/2013  ? x3-Dr.Eric Redmond Pulling  ? PROSTATE SURGERY  2007  ? photovaporization  ? ROTATOR CUFF REPAIR  10/2008  ? left; Dr. Onnie Graham  ?  SURGERY  2006  ? L4-5 disk surgery  ? SPINE SURGERY  04/2013  ? L4-5, L5-S1 fusion.  Dr. Christella Noa  ? THORACIC AORTIC ANEURYSM REPAIR N/A 01/23/2014  ? Procedure: THORACIC ASCENDING ANEURYSM REPAIR ([facility]);  Surgeon: Rexene Alberts, MD;  Location: [facility];  Service: Open Heart Surgery;  Laterality: N/A;  ? TONSILLECTOMY    ? ? ?No Known Allergies ? ?Prior to Admission medications   ?Medication Sig Start Date End Date Taking? Authorizing Provider  ?acetaminophen (TYLENOL) 500 MG tablet Take 1,000 mg by mouth every 8 (eight) hours as needed for mild pain.   Yes [provider]  ?amLODipine (NORVASC) 5 MG tablet TAKE 1 TABLET BY MOUTH  TWICE DAILY ?Patient taking differently: Take 5 mg by mouth 2 (two) times daily. 12/17/20  Yes Lelon Perla, MD  ?atorvastatin (LIPITOR) 10 MG tablet TAKE 1 TABLET BY MOUTH  DAILY ?Patient taking differently: Take 10 mg by mouth daily. 01/16/21  Yes Rita Ohara, MD  ?Cholecalciferol (VITAMIN D3) 2000 units TABS Take 2,000 Units by mouth daily.    Yes [provider]  ?citalopram (CELEXA) 20 MG tablet Take 1 tablet (20 mg total) by mouth daily. ?Patient taking differently: Take 30 mg by mouth daily. 03/12/20  Yes Rita Ohara, MD  ?Ferrous Sulfate (IRON) 325 (65 Fe) MG TABS Take 1 tablet (325 mg total) by mouth every other day. Reported on 11/08/2015 12/28/18  Yes Angiulli, Lavon Paganini, PA-C  ?fexofenadine (ALLEGRA) 180 MG tablet Take 180 mg by mouth daily as needed for allergies.   Yes [provider]  ?irbesartan (AVAPRO) 300 MG tablet TAKE 1 TABLET BY MOUTH  DAILY ?Patient  taking differently: Take 300 mg by mouth daily. 07/27/20  Yes Lelon Perla, MD  ?loperamide (IMODIUM A-D) 2 MG tablet Take 2 mg by mouth 3 (three) times daily as needed for diarrhea or loose stools.   Yes [provider]  ?memantine (NAMENDA) 10 MG tablet Take 10 mg by mouth 2 (two) times daily.   Yes [provider]  ?metoprolol tartrate (LOPRESSOR) 25 MG tablet TAKE 1 TABLET BY MOUTH  TWICE DAILY ?Patient taking differently: Take 25 mg by mouth 2 (two) times daily. 03/26/20  Yes Lelon Perla, MD  ?omeprazole (PRILOSEC) 20 MG capsule TAKE 1 CAPSULE BY MOUTH  EVERY OTHER DAY ?Patient taking differently: Take 20 mg by mouth daily. 03/12/20  Yes Rita Ohara, MD  ?SYNTHROID 50 MCG tablet TAKE 1 TABLET BY MOUTH  DAILY BEFORE BREAKFAST ?Patient taking differently: Take  50 mcg by mouth daily. 02/06/21  Yes Rita Ohara, MD  ?traMADol (ULTRAM) 50 MG tablet Take 25 mg by mouth 2 (two) times daily. 11/07/21  Yes [provider]  ? ? ?Social History  ? ?Socioeconomic History  ? Marital status: Married  ?  Spouse name: Not on file  ? Number of children: 2  ? Years of education: Not on file  ? Highest education level: Not on file  ?Occupational History  ? Not on file  ?Tobacco Use  ? Smoking status: Former  ?  Types: Cigarettes  ?  Quit date: 09/01/1977  ?  Years since quitting: 44.2  ? Smokeless tobacco: Never  ?Vaping Use  ? Vaping Use: Never used  ?Substance and Sexual Activity  ? Alcohol use: Yes  ?  Comment: 2 glasses wine per day (small)  ? Drug use: No  ? Sexual activity: Not on file  ?Other Topics Concern  ? Not on file  ?Social History Narrative  ? Lives with wife. 1 daughter in [place], 1 daughter in [place], [place].  ? Getting stairlift installed 01/2019  ? 5 grandchildren.  ? ?Social Determinants of Health  ? ?Financial Resource Strain: Not on file  ?Food Insecurity: Not on file  ?Transportation Needs: Not on file  ?Physical Activity: Not on file  ?Stress: Not on file  ?Social Connections:  Not on file  ?Intimate Partner Violence: Not on file  ? ? ? ?Family History  ?Problem Relation Age of Onset  ? Diabetes Mother   ? Heart disease Mother   ?     CHF  ? Hypertension Mother   ? Depressio

## 2021-11-26 NOTE — Progress Notes (Signed)
SLP Cancellation Note ? ?Patient Details ?Name: Danny Frederick ?MRN: [id number] ?DOB: [birth year] ? ? ?Cancelled treatment:       Reason Eval/Treat Not Completed: SLP screened, no needs identified, will sign off ? ?Sonia Baller, MA, CCC-SLP ?Speech Therapy ? ?

## 2021-11-26 NOTE — Assessment & Plan Note (Signed)
?   MRI of the brain without contrast confirming right basal ganglia and right dorsal pontine infarcts ?? On exam patient still exhibits the following deficit: Evidence of intraocular nuclear ophthalmoplegia ?? Performing serial neurologic checks ?? Monitoring patient on telemetry ?? Initiating antiplatelet therapy including aspirin 81 mg daily per neurology recommendations ?? Daily statin therapy will be initiated if LDL is greater than 70 ?? Obtaining hemoglobin A1c and lipid panel in the morning ?? Echocardiogram in the morning ?? PT, OT, SLP evaluation ?? Permissive hypertension with as needed antihypertensives only to be given if blood pressure greater than 220/115 ?? Neurology following in consultation. ? ?

## 2021-11-27 ENCOUNTER — Inpatient Hospital Stay (HOSPITAL_COMMUNITY): Payer: Medicare Other

## 2021-11-27 DIAGNOSIS — E039 Hypothyroidism, unspecified: Secondary | ICD-10-CM | POA: Diagnosis not present

## 2021-11-27 DIAGNOSIS — I6381 Other cerebral infarction due to occlusion or stenosis of small artery: Secondary | ICD-10-CM | POA: Diagnosis not present

## 2021-11-27 DIAGNOSIS — I10 Essential (primary) hypertension: Secondary | ICD-10-CM | POA: Diagnosis not present

## 2021-11-27 DIAGNOSIS — K219 Gastro-esophageal reflux disease without esophagitis: Secondary | ICD-10-CM | POA: Diagnosis not present

## 2021-11-27 LAB — ECHOCARDIOGRAM COMPLETE BUBBLE STUDY
Area-P 1/2: 2.76 cm2
S' Lateral: 2.28 cm

## 2021-11-27 MED ORDER — SODIUM CHLORIDE 0.9 % IV BOLUS
1000.0000 mL | Freq: Once | INTRAVENOUS | Status: AC
  Administered 2021-11-27: 1000 mL via INTRAVENOUS

## 2021-11-27 MED ORDER — PERFLUTREN LIPID MICROSPHERE
1.0000 mL | INTRAVENOUS | Status: AC | PRN
  Administered 2021-11-27: 4 mL via INTRAVENOUS
  Filled 2021-11-27: qty 10

## 2021-11-27 MED ORDER — POLYVINYL ALCOHOL 1.4 % OP SOLN
1.0000 [drp] | OPHTHALMIC | Status: DC | PRN
  Administered 2021-11-28 – 2021-11-29 (×3): 1 [drp] via OPHTHALMIC
  Filled 2021-11-27: qty 15

## 2021-11-27 NOTE — Progress Notes (Signed)
? ?Progress note ? ?Patient: Danny Frederick MRN: [id number] DOA: 11/25/2021 ? ?Date of Service: the patient was seen and examined on 11/27/2021 ? ?Patient coming from: Home via EMS ? ?Chief Complaint:  ?Chief Complaint  ?Patient presents with  ? Eye Problem  ? ? ?HPI:  ? ?88 year old male with past medical history of acute hemorrhagic stroke involving the posterior left basal ganglia 12/2018, hypertension, benign prostatic hyperplasia, ascending aortic aneurysm status postrepair in 2015, hyperlipidemia and hypothyroidism who presents to [facility] via EMS with complaints of double vision. ? ?Patient explains that on Saturday yesterday he was eating lunch when he attempted to reach for the juice on the table but found out that he could not due to a sudden visual change.  Patient noticed that he was seeing double and I was not able to see objects appropriately on his left side.  Patient denies any associated headaches, facial droop, unsteady gait, focal weakness or slurred speech. ? ?Patient allowed his symptoms to persist throughout the day and the following day until Monday.   When symptoms were unrelenting EMS was contacted and promptly came to evaluate the patient and brought him into [facility] emergency department for evaluation. ? ?Upon evaluation in the emergency department due to identified neurologic deficits on exam MRI brain was obtained revealing an acute right basal ganglia infarct.  ER provider then discussed the case with Dr. Lorrin Goodell with neurology who agreed to see the patient in consultation.  The hospitalist group was then called to assess the patient for admission the hospital. ? ? ? ?Physical Exam: ? ?Vitals:  ? 11/26/21 2050 11/27/21 0020 11/27/21 0330 11/27/21 0757  ?BP: (!) 156/80 106/79 126/72 (!) 146/83  ?Pulse: 70 84 76 72  ?Resp: '18 16 18 18  '$ ?Temp: 97.6 ?F (36.4 ?C) 98.5 ?F (36.9 ?C) 98.3 ?F (36.8 ?C) 98.3 ?F (36.8 ?C)  ?TempSrc: Oral     ?SpO2: 100% 97% 95% 96%   ? ? ?Constitutional: Awake alert and oriented x3, no associated distress.   ?Skin: no rashes, no lesions, good skin turgor noted. ?Eyes: Pupils are equally reactive to light.  No evidence of scleral icterus or conjunctival pallor.  ?ENMT: Moist mucous membranes noted.  Posterior pharynx clear of any exudate or lesions.   ?Neck: normal, supple, no masses, no thyromegaly.  No evidence of jugular venous distension.   ?Respiratory: clear to auscultation bilaterally, no wheezing, no crackles. Normal respiratory effort. No accessory muscle use.  ?Cardiovascular: Regular rate and rhythm, no murmurs / rubs / gallops. No extremity edema. 2+ pedal pulses. No carotid bruits.  ?Chest:   Nontender without crepitus or deformity.   ?Back:   Nontender without crepitus or deformity. ?Abdomen: Abdomen is soft and nontender.  No evidence of intra-abdominal masses.  Positive bowel sounds noted in all quadrants.   ?Musculoskeletal: No joint deformity upper and lower extremities. Good ROM, no contractures. Normal muscle tone.  ?Neurologic: Evidence of internuclear opathlmoplegia.  Othwerwise, remainder of CN 2-12 grossly intact. Sensation intact.  Patient moving all 4 extremities spontaneously.  Patient is following all commands.  Patient is responsive to verbal stimuli.   ?Psychiatric: Patient exhibits normal mood with appropriate affect.  Patient seems to possess insight as to their current situation.   ? ?Data Reviewed: ? ?I have personally reviewed and interpreted labs, imaging. ? ?Significant findings are hemoglobin 12.0, white blood cell count 8.3. ? ?MRI brain without contrast performed revealed 7 mm acute ischemic nonhemorrhagic anterior right basal ganglia infarct  with additional punctate 4 mm acute ischemic nonhemorrhagic right dorsal pontine infarct. ? ?EKG: Personally reviewed.  Rhythm is normal sinus rhythm with heart rate of 82 bpm.  PACs noted.  no dynamic ST segment changes appreciated. ? ? ?Assessment and Plan: ?*  Stroke of right basal ganglia (HCC) ?MRI of the brain without contrast confirming right basal ganglia and right dorsal pontine infarcts ?Performing serial neurologic checks ?Monitoring patient on telemetry ?Initiating antiplatelet therapy including aspirin 81 mg daily per neurology recommendations and Plavix ?Daily statin therapy will be initiated if LDL is greater than 70 ?Hemoglobin A1c 5.5% LDL 108 ?Echo pending ?permissive hypertension with as needed antihypertensives only to be given if blood pressure greater than 220/115 ?Neurology following in consultation.   ?Right ICA 40 to 59% stenosis evaluated by vascular surgery no surgery at this time ? ? ?Essential hypertension ?Holding home antihypertensive therapy for brief period of time for permissive hypertension ?Home antihypertensive therapy will be gradually reinitiated in the next 24 to 48 hours ?As needed intravenous antihypertensives for markedly elevated blood pressures. ? ?Hypothyroidism ?Resume home regimen of Synthroid ? ? ?Mixed hyperlipidemia ?Continuing home regimen of lipid lowering therapy. ?Obtaining lipid panel and will titrate statin therapy as necessary to maintain LDL less than 70 ? ? ?GERD without esophagitis ?Continuing home regimen of daily PPI therapy. ? ? ? ?Cardiac echo and physical therapy evaluations are still pending ? ? ? ?Code Status:  DNR  code status decision has been confirmed with: patient ? ?Consults: Dr. Lorrin Goodell with Neurology ? ?Heiress Williamson A MD ? ?11/27/2021 11:10 AM ? ?

## 2021-11-27 NOTE — Progress Notes (Addendum)
STROKE TEAM PROGRESS NOTE  ? ?INTERVAL HISTORY ?Patient is seen in his room with no family at the bedside.   ?Vascular surgery consulted and currently recommend no surgery at this time as carotid ultrasound shows only 40 to 59% stenosis on CT angiogram likely over calling the degree of stenosis. ?Hgb 13.0/40.3, LDL 108 ?2D Echo pending ? ?Vitals:  ? 11/26/21 2050 11/27/21 0020 11/27/21 0330 11/27/21 0757  ?BP: (!) 156/80 106/79 126/72 (!) 146/83  ?Pulse: 70 84 76 72  ?Resp: '18 16 18 18  '$ ?Temp: 97.6 ?F (36.4 ?C) 98.5 ?F (36.9 ?C) 98.3 ?F (36.8 ?C) 98.3 ?F (36.8 ?C)  ?TempSrc: Oral     ?SpO2: 100% 97% 95% 96%  ? ?CBC:  ?Recent Labs  ?Lab 11/25/21 ?1619 11/26/21 ?4650  ?WBC 8.3 9.2  ?NEUTROABS 5.3 5.9  ?HGB 12.0* 13.0  ?HCT 36.7* 40.3  ?MCV 89.1 88.8  ?PLT 368 358  ? ? ?Basic Metabolic Panel:  ?Recent Labs  ?Lab 11/25/21 ?1619 11/26/21 ?3546  ?NA 136 137  ?K 4.1 3.7  ?CL 105 102  ?CO2 24 25  ?GLUCOSE 99 92  ?BUN 20 12  ?CREATININE 1.01 0.77  ?CALCIUM 9.3 9.4  ?MG  --  1.9  ? ? ?Lipid Panel:  ?Recent Labs  ?Lab 11/26/21 ?5681  ?CHOL 188  ?TRIG 78  ?HDL 64  ?CHOLHDL 2.9  ?VLDL 16  ?[place] 108*  ? ? ?HgbA1c:  ?Recent Labs  ?Lab 11/26/21 ?2751  ?HGBA1C 5.5  ? ? ?Urine Drug Screen: No results for input(s): LABOPIA, COCAINSCRNUR, LABBENZ, AMPHETMU, THCU, LABBARB in the last 168 hours.  ?Alcohol Level No results for input(s): ETH in the last 168 hours. ? ?IMAGING past 24 hours ?VAS US CAROTID ? ?Result Date: 11/26/2021 ?Carotid Arterial Duplex Study Patient Name:  Danny Frederick  Date of Exam:   11/26/2021 Medical Rec #: [id number]        Accession #:    [id number] Date of Birth: 02-22-1934        Patient Gender: M Patient Age:   88 years Exam Location:  [facility] Procedure:      VAS US CAROTID Referring Phys: Harrell Gave DICKSON --------------------------------------------------------------------------------  Indications:       Stenosis on CTA. Risk Factors:      Hypertension, hyperlipidemia, past history of smoking.  Limitations        Today's exam was limited due to heavy calcification and the                    resulting shadowing. Comparison Study:  11-25-2021 CTA results showed bulky plaque about the right                    carotid bulb/proximal right ICA with associated stenosis of                    up to 75% by NASCET criteria. Calcified plaque about the left                    carotid bulb/proximal left ICA with associated stenosis of up                    to 40-50% by NASCET criteria. Performing Technologist: Darlin Coco RDMS, RVT  Examination Guidelines: A complete evaluation includes B-mode imaging, spectral Doppler, color Doppler, and power Doppler as needed of all accessible portions of each vessel. Bilateral testing is considered an integral part of a complete examination.  Limited examinations for reoccurring indications may be performed as noted.  Right Carotid Findings: +----------+--------+--------+--------+------------------+--------+           PSV cm/sEDV cm/sStenosisPlaque DescriptionComments +----------+--------+--------+--------+------------------+--------+ CCA Prox  71      12                                         +----------+--------+--------+--------+------------------+--------+ CCA Distal70      14                                         +----------+--------+--------+--------+------------------+--------+ ICA Prox  142     44      40-59%  calcific                   +----------+--------+--------+--------+------------------+--------+ ICA Mid   99      20                                         +----------+--------+--------+--------+------------------+--------+ ICA Distal45      12                                         +----------+--------+--------+--------+------------------+--------+ ECA       121     12                                         +----------+--------+--------+--------+------------------+--------+  +----------+--------+-------+----------------+-------------------+           PSV cm/sEDV cmsDescribe        Arm Pressure (mmHG) +----------+--------+-------+----------------+-------------------+ [id number]             Multiphasic, WNL                    +----------+--------+-------+----------------+-------------------+ +---------+--------+--+--------+--+---------+ VertebralPSV cm/s41EDV cm/s12Antegrade +---------+--------+--+--------+--+---------+  Left Carotid Findings: +----------+--------+--------+--------+-------------------------+--------+           PSV cm/sEDV cm/sStenosisPlaque Description       Comments +----------+--------+--------+--------+-------------------------+--------+ CCA Prox  72      13                                                +----------+--------+--------+--------+-------------------------+--------+ CCA Distal65      14                                                +----------+--------+--------+--------+-------------------------+--------+ ICA Prox  70      14      1-39%   calcific and heterogenous         +----------+--------+--------+--------+-------------------------+--------+ ICA Distal63      18                                                +----------+--------+--------+--------+-------------------------+--------+  ECA       149     18                                                +----------+--------+--------+--------+-------------------------+--------+ +----------+--------+--------+----------------+-------------------+           PSV cm/sEDV cm/sDescribe        Arm Pressure (mmHG) +----------+--------+--------+----------------+-------------------+ [id number]              Multiphasic, WNL                    +----------+--------+--------+----------------+-------------------+ +---------+--------+--+--------+-+---------+ VertebralPSV cm/s26EDV cm/s6Antegrade +---------+--------+--+--------+-+---------+    Summary: Right Carotid: Velocities in the right ICA are consistent with a 40-59%                stenosis. Left Carotid: Velocities in the left ICA are consistent with a 1-39% stenosis. Vertebrals:  Bilateral vertebral arteries demonstrate antegrade flow. Subclavians: Normal flow hemodynamics were seen in bilateral subclavian              arteries. *See table(s) above for measurements and observations.     Preliminary    ? ?PHYSICAL EXAM ?General:  Alert, well-developed, well-nourished elderly Caucasian male patient in no acute distress ?Respiratory: Regular, unlabored respirations on room air ? ?NEURO:  ?Mental Status: AA&Ox3  ?Speech/Language: speech is without dysarthria or aphasia.  Fluency and comprehension intact. ? ?Cranial Nerves:  ?II: PERRL. Visual fields full.  ?III, IV, VI: Right sided INO present with absent adduction of the right eye with few beats of end gaze nystagmus while looking to the left ?V: Sensation is intact to light touch and symmetrical to face.  ?VII: Smile is symmetrical.   ?VIII: hearing intact to voice. ?IX, X: Phonation is normal.  ?XII: tongue is midline without fasciculations. ?Motor: 5/5 strength to all muscle groups tested.  ?Sensation- Intact to light touch bilaterally. ?Coordination: FTN intact bilaterally.No drift.  ?Gait- deferred ? ? ? ?ASSESSMENT/PLAN ?[name] is a 88 y.o. male with history of BPH, HTN, GERD, AAA rupture s/p repair presenting with new onset diplopia when looking to the left which has been present for two days.  MRI demonstrates small ischemic infarcts in right basal ganglia and dorsal pons.  He presented outside the window for TNK, and thrombectomy was not done as he had no LVO. Plan for a carotid duplex in 6 months per VVS and follow up with [facility]VS outpatient. No surgery recommended by vascular surgery team given that carotid duplex shows 40-59% stenosis in Rt ICA.  ? ?Stroke: lacunar right basal ganglia and dorsal pontine infarcts likely due to  small vessel disease but patient also has 75% right ICA stenosis ?CT head No acute abnormality. Small vessel disease. Atrophy. ?CTA head & neck 75% right ICA stenosis, 40-50% left ICA stenosis ?MRI  26m acute anterior right basal ganglia infarct and [place] ischemic dorsal pontine infarct, atrophy and small vessel disease ?Carotid duplex- Rt ICA 40-59% stenosis, Lt IC

## 2021-11-27 NOTE — Progress Notes (Signed)
Physical Therapy Evaluation ?Patient Details ?Name: Danny Frederick ?MRN: [id number] ?DOB: 08-Jan-1934 ?Today's Date: 11/27/2021 ? ?History of Present Illness ? Pt is as 88 y/o male presenting on 3/27 with double vision. MRI + R basal ganglia CVA, R dorsal pontine infarct. PMH includes: HTN pulmonary artery thrombosis, ruptured thoracic aortic aneurysm and repair, foot drop R, prior spine surgery.  ?Clinical Impression ? Pt admitted with/for double vision and imaging showing stroke in R BG and R dorsal pontine.  Pt needing min to mod assist for basic mobility and gait.Marland Kitchen  Pt currently limited functionally due to the problems listed. ( See problems list.)   Pt will benefit from PT to maximize function and safety in order to get ready for next venue listed below. ?   ?   ? ?Recommendations for follow up therapy are one component of a multi-disciplinary discharge planning process, led by the attending physician.  Recommendations may be updated based on patient status, additional functional criteria and insurance authorization. ? ?Follow Up Recommendations Acute inpatient rehab (3hours/day) (SNF if inadequate assist) ? ?  ?Assistance Recommended at Discharge Frequent or constant Supervision/Assistance  ?Patient can return home with the following ? A little help with walking and/or transfers;A little help with bathing/dressing/bathroom;Assistance with cooking/housework;Direct supervision/assist for medications management;Direct supervision/assist for financial management;Assist for transportation ? ?  ?Equipment Recommendations BSC/3in1  ?Recommendations for Other Services ? Rehab consult  ?  ?Functional Status Assessment Patient has had a recent decline in their functional status and demonstrates the ability to make significant improvements in function in a reasonable and predictable amount of time.  ? ?  ?Precautions / Restrictions Precautions ?Precautions: Fall  ? ?  ? ?Mobility ? Bed Mobility ?Overal bed mobility: Needs  Assistance ?Bed Mobility: Rolling, Supine to Sit, Sit to Supine ?Rolling: Min guard ?  ?Supine to sit: Min assist ?Sit to supine: Min assist ?  ?General bed mobility comments: pt having difficulty getting over onto his L UE to Push up without minimal assist, also needed minimal assist to coordinate  control to return to supine in adequate positioning. ?  ? ?Transfers ?Overall transfer level: Needs assistance ?Equipment used: None, Rolling walker (2 wheels) ?Transfers: Sit to/from Stand ?Sit to Stand: Min assist ?  ?  ?  ?  ?  ?General transfer comment: vues for hand placement and assist coming forward with minor boost assist. ?  ? ?Ambulation/Gait ?Ambulation/Gait assistance: Mod assist ?Gait Distance (Feet): 12 Feet (with RW) ?Assistive device: Rolling walker (2 wheels) ?Gait Pattern/deviations: Step-through pattern, Decreased step length - right, Decreased step length - left, Decreased stride length ?  ?Gait velocity interpretation: <1.31 ft/sec, indicative of household ambulator ?  ?General Gait Details: unsteady, short guarded steps with list to the left, all worsening with anxiety and need to sit before attaining the mutually determing distance goal to the chair. ? ?Stairs ?  ?  ?  ?  ?  ? ?Wheelchair Mobility ?  ? ?Modified Rankin (Stroke Patients Only) ?Modified Rankin (Stroke Patients Only) ?Pre-Morbid Rankin Score: Slight disability ?Modified Rankin: Moderately severe disability ? ?  ? ?Balance Overall balance assessment: Needs assistance ?  ?Sitting balance-Leahy Scale: Fair (to good.) ?Sitting balance - Comments: could pull right LE onto L knee to attempt donning socks with increased lean posteriorly.  Not able to donn his sock.  Pt less able to donn L sock.  Pt reports wife assisting. ?Postural control: Posterior lean, Other (comment) (with challenge like donning socks.) ?Standing balance support: Bilateral upper  extremity supported, Reliant on assistive device for balance ?Standing balance-Leahy Scale:  Poor ?Standing balance comment: L lean while using RW, worsening with anxiety. ?  ?  ?  ?  ?  ?  ?  ?  ?  ?  ?  ?   ? ? ? ?Pertinent Vitals/Pain Pain Assessment ?Pain Assessment: No/denies pain  ? ? ?Home Living Family/patient expects to be discharged to:: Private residence ?Living Arrangements: Spouse/significant other;Other (Comment) (who has dementia) ?Available Help at Discharge: Available PRN/intermittently (dtr close by to provide intermittent assist.  Presently caring for her Mom.) ?Type of Home: Independent living facility (with dining services) ?Home Access: Level entry ?  ?  ?  ?Home Layout: One level ?Home Equipment: Rolling Walker (2 wheels);BSC/3in1;Grab bars - toilet ?   ?  ?Prior Function Prior Level of Function : Independent/Modified Independent (on a home-like environment.  Caring for his wife who is mobile, but has dementia) ?  ?  ?  ?  ?  ?  ?  ?  ?  ? ? ?Hand Dominance  ?   ? ?  ?Extremity/Trunk Assessment  ? Upper Extremity Assessment ?Upper Extremity Assessment: Generalized weakness ?  ? ?Lower Extremity Assessment ?Lower Extremity Assessment: Generalized weakness (functional, but notable incoordination during gait.  Pt report normal sensation.) ?  ? ?Cervical / Trunk Assessment ?Cervical / Trunk Assessment: Kyphotic  ?Communication  ? Communication: No difficulties  ?Cognition Arousal/Alertness: Awake/alert ?Behavior During Therapy: [facility] for tasks assessed/performed ?Overall Cognitive Status: Within Functional Limits for tasks assessed ?  ?  ?  ?  ?  ?  ?  ?  ?  ?  ?  ?  ?  ?  ?  ?  ?  ?  ?  ? ?  ?General Comments General comments (skin integrity, edema, etc.): Continues to have double vision horizontally.  Pt doesn't relay any improvement. ? ?  ?Exercises    ? ?Assessment/Plan  ?  ?PT Assessment Patient needs continued PT services  ?PT Problem List Decreased strength;Decreased activity tolerance;Decreased balance;Decreased mobility;Decreased coordination;Decreased knowledge of use of DME ? ?    ?  ?PT Treatment Interventions DME instruction;Gait training;Functional mobility training;Therapeutic activities;Balance training;Patient/family education;Neuromuscular re-education   ? ?PT Goals (Current goals can be found in the Care Plan section)  ?Acute Rehab PT Goals ?Patient Stated Goal: I need to be able to care for my wife. ?PT Goal Formulation: With patient ?Time For Goal Achievement: 12/11/21 ?Potential to Achieve Goals: Good ? ?  ?Frequency Min 3X/week ?  ? ? ?Co-evaluation   ?  ?  ?  ?  ? ? ?  ?AM-PAC PT "6 Clicks" Mobility  ?Outcome Measure Help needed turning from your back to your side while in a flat bed without using bedrails?: A Little ?Help needed moving from lying on your back to sitting on the side of a flat bed without using bedrails?: A Little ?Help needed moving to and from a bed to a chair (including a wheelchair)?: A Lot ?Help needed standing up from a chair using your arms (e.g., wheelchair or bedside chair)?: A Little ?Help needed to walk in hospital room?: A Lot ?Help needed climbing 3-5 steps with a railing? : A Lot ?6 Click Score: 15 ? ?  ?End of Session   ?Activity Tolerance: Patient tolerated treatment well;Other (comment) (dysequilibrium causing anxiety) ?Patient left: in bed;with call bell/phone within reach;with bed alarm set ?Nurse Communication: Mobility status ?PT Visit Diagnosis: Unsteadiness on feet (R26.81);Other abnormalities of gait  and mobility (R26.89);Difficulty in walking, not elsewhere classified (R26.2);Other symptoms and signs involving the nervous system (R29.898) ?  ? ?Time: 1131-1202 ?PT Time Calculation (min) (ACUTE ONLY): 31 min ? ? ?Charges:   PT Evaluation ?$PT Eval Moderate Complexity: 1 Mod ?PT Treatments ?$Therapeutic Activity: 8-22 mins ?  ?   ? ? ?11/27/2021 ? ?Jacinto Halim., PT ?Acute Rehabilitation Services ?[phone]  (pager) ?[phone]  (office) ? ?Eliseo Gum Brantlee Hinde ?11/27/2021, 1:58 PM ?

## 2021-11-27 NOTE — Progress Notes (Addendum)
? ?  VASCULAR SURGERY ASSESSMENT & PLAN:  ? ?RIGHT CAROTID STENOSIS: As per Dr. Clydene Fake note, his small strokes may be secondary to small vessel disease and do not appear to be related to his right ICA stenosis.  In addition, his carotid duplex scan shows that the stenosis is only in the 40 to 59% range.  Peak systolic velocity is 562 cm/s with an end-diastolic velocity of 44 cm/s.  In addition he is now on aspirin and Plavix which he was not on before.  All things considered I would not recommend right carotid endarterectomy at this point.  I will arrange a follow-up carotid duplex scan in 6 months and see him back at that time.  I have discussed this with the patient this morning and we will try to contact his daughter also. ? ? ?SUBJECTIVE:  ? ?No complaints this morning. ? ?PHYSICAL EXAM:  ? ?Vitals:  ? 11/26/21 1806 11/26/21 2050 11/27/21 0020 11/27/21 0330  ?BP: 140/88 (!) 156/80 106/79 126/72  ?Pulse: 71 70 84 76  ?Resp: '17 18 16 18  '$ ?Temp: 98 ?F (36.7 ?C) 97.6 ?F (36.4 ?C) 98.5 ?F (36.9 ?C) 98.3 ?F (36.8 ?C)  ?TempSrc: Oral Oral    ?SpO2: 98% 100% 97% 95%  ? ?No focal weakness. ?Some improvement in his right visual disturbance. ? ? ?LABS:  ? ?CAROTID DUPLEX: I have reviewed his carotid duplex scan.  This shows a 40 to 59% right carotid stenosis with no significant stenosis on the left.  Both vertebral arteries are patent with antegrade flow. ? ?Lab Results  ?Component Value Date  ? WBC 9.2 11/26/2021  ? HGB 13.0 11/26/2021  ? HCT 40.3 11/26/2021  ? MCV 88.8 11/26/2021  ? PLT 358 11/26/2021  ? ?Lab Results  ?Component Value Date  ? CREATININE 0.77 11/26/2021  ? ?Lab Results  ?Component Value Date  ? INR 1.1 12/11/2018  ? ? ?PROBLEM LIST:   ? ?Principal Problem: ?  Stroke of right basal ganglia ([place]) ?Active Problems: ?  Essential hypertension ?  Hypothyroidism ?  GERD without esophagitis ?  Mixed hyperlipidemia ? ? ?CURRENT MEDS:  ? ?  stroke: mapping our early stages of recovery book   Does not apply Once  ?  aspirin EC  81 mg Oral Daily  ? atorvastatin  40 mg Oral Daily  ? citalopram  20 mg Oral Daily  ? clopidogrel  75 mg Oral Daily  ? enoxaparin (LOVENOX) injection  40 mg Subcutaneous Q24H  ? irbesartan  300 mg Oral Daily  ? levothyroxine  50 mcg Oral Q0600  ? pantoprazole  40 mg Oral Daily  ? ? ?Deitra Mayo ?Office: [phone] ?11/27/2021 ? ?

## 2021-11-27 NOTE — Progress Notes (Signed)
*  PRELIMINARY RESULTS* ?Echocardiogram ?2D Echocardiogram has been performed with Definity and saline bubble study. ? ?Samuel Germany ?11/27/2021, 2:46 PM ?

## 2021-11-27 NOTE — Progress Notes (Signed)
Patient is from [place]. Recommendations are for CIR. TOC following. ?

## 2021-11-27 NOTE — Plan of Care (Signed)
?  Problem: Education: Goal: Knowledge of General Education information will improve Description: Including pain rating scale, medication(s)/side effects and non-pharmacologic comfort measures Outcome: Progressing   Problem: Health Behavior/Discharge Planning: Goal: Ability to manage health-related needs will improve Outcome: Progressing   Problem: Clinical Measurements: Goal: Ability to maintain clinical measurements within normal limits will improve Outcome: Progressing Goal: Will remain free from infection Outcome: Progressing Goal: Diagnostic test results will improve Outcome: Progressing Goal: Respiratory complications will improve Outcome: Progressing Goal: Cardiovascular complication will be avoided Outcome: Progressing   Problem: Activity: Goal: Risk for activity intolerance will decrease Outcome: Progressing   Problem: Nutrition: Goal: Adequate nutrition will be maintained Outcome: Progressing   Problem: Coping: Goal: Level of anxiety will decrease Outcome: Progressing   Problem: Elimination: Goal: Will not experience complications related to bowel motility Outcome: Progressing Goal: Will not experience complications related to urinary retention Outcome: Progressing   Problem: Pain Managment: Goal: General experience of comfort will improve Outcome: Progressing   Problem: Safety: Goal: Ability to remain free from injury will improve Outcome: Progressing   Problem: Skin Integrity: Goal: Risk for impaired skin integrity will decrease Outcome: Progressing   Problem: Education: Goal: Knowledge of disease or condition will improve Outcome: Progressing Goal: Knowledge of secondary prevention will improve (SELECT ALL) Outcome: Progressing Goal: Knowledge of patient specific risk factors will improve (INDIVIDUALIZE FOR PATIENT) Outcome: Progressing Goal: Individualized Educational Video(s) Outcome: Progressing   Problem: Coping: Goal: Will verbalize  positive feelings about self Outcome: Progressing Goal: Will identify appropriate support needs Outcome: Progressing   Problem: Health Behavior/Discharge Planning: Goal: Ability to manage health-related needs will improve Outcome: Progressing   Problem: Self-Care: Goal: Ability to participate in self-care as condition permits will improve Outcome: Progressing Goal: Verbalization of feelings and concerns over difficulty with self-care will improve Outcome: Progressing Goal: Ability to communicate needs accurately will improve Outcome: Progressing   Problem: Nutrition: Goal: Risk of aspiration will decrease Outcome: Progressing Goal: Dietary intake will improve Outcome: Progressing   Problem: Ischemic Stroke/TIA Tissue Perfusion: Goal: Complications of ischemic stroke/TIA will be minimized Outcome: Progressing   

## 2021-11-27 NOTE — Progress Notes (Signed)
Inpatient Rehab Admissions Coordinator Note:  ? ?Per PT recommendations patient was screened for CIR candidacy by Michel Santee, PT. At this time, pt appears to be a potential candidate for CIR. I will place an order for rehab consult for full assessment, per our protocol.  Please contact me any with questions.. ? ?Shann Medal, PT, DPT ?[phone] ?11/27/21 ?4:18 PM  ?

## 2021-11-27 NOTE — Evaluation (Signed)
Occupational Therapy Evaluation ?Patient Details ?Name: Danny Frederick ?MRN: [id number] ?DOB: 07/27/1934 ?Today's Date: 11/27/2021 ? ? ?History of Present Illness Pt is as 88 y/o male presenting on 3/27 with double vision. MRI + R basal ganglia CVA, R dorsal pontine infarct. PMH includes: HTN pulmonary artery thrombosis, ruptured thoracic aortic aneurysm and repair, foot drop R, prior spine surgery.  ? ?Clinical Impression ?  ?Pt admitted for concerns listed above. PTA pt reported that he was requiring minimal assist from the ALF that he was living at, as well as providing assist for his wife with Dementia. At this time, pt is limited due to weakness, balance deficits, decreased activity tolerance, and increased fear of falling. He requires min-mod A overall for all ADL's and functional mobility using the RW. Recommending AIR to maximize his independence and ability to continue being a caregiver for his wife. OT will continue to follow acutely.   ?   ? ?Recommendations for follow up therapy are one component of a multi-disciplinary discharge planning process, led by the attending physician.  Recommendations may be updated based on patient status, additional functional criteria and insurance authorization.  ? ?Follow Up Recommendations ? Acute inpatient rehab (3hours/day)  ?  ?Assistance Recommended at Discharge Frequent or constant Supervision/Assistance  ?Patient can return home with the following A little help with walking and/or transfers;A lot of help with bathing/dressing/bathroom;Assistance with cooking/housework;Direct supervision/assist for medications management ? ?  ?Functional Status Assessment ? Patient has had a recent decline in their functional status and demonstrates the ability to make significant improvements in function in a reasonable and predictable amount of time.  ?Equipment Recommendations ? None recommended by OT  ?  ?Recommendations for Other Services Rehab consult ? ? ?  ?Precautions /  Restrictions Precautions ?Precautions: Fall ?Restrictions ?Weight Bearing Restrictions: No  ? ?  ? ?Mobility Bed Mobility ?Overal bed mobility: Needs Assistance ?Bed Mobility: Rolling, Supine to Sit, Sit to Supine ?Rolling: Min guard ?  ?Supine to sit: Min assist ?Sit to supine: Min assist ?  ?General bed mobility comments: pt having difficulty getting over onto his L UE to Push up without minimal assist, also needed minimal assist to coordinate  control to return to supine in adequate positioning. ?  ? ?Transfers ?Overall transfer level: Needs assistance ?Equipment used: Rolling walker (2 wheels) ?Transfers: Sit to/from Stand ?Sit to Stand: Min assist ?  ?  ?  ?  ?  ?General transfer comment: cues for hand placement and assist coming forward with minor boost assist. ?  ? ?  ?Balance Overall balance assessment: Needs assistance ?  ?Sitting balance-Leahy Scale: Good ?Sitting balance - Comments: could pull right LE onto L knee to attempt donning socks with increased lean posteriorly.  Not able to donn his sock.  Pt less able to donn L sock.  Pt reports wife assisting. ?Postural control: Posterior lean, Other (comment) (with challenge like donning socks.) ?Standing balance support: Bilateral upper extremity supported, Reliant on assistive device for balance ?Standing balance-Leahy Scale: Poor ?Standing balance comment: L lean while using RW, worsening with anxiety. ?  ?  ?  ?  ?  ?  ?  ?  ?  ?  ?  ?   ? ?ADL either performed or assessed with clinical judgement  ? ?ADL Overall ADL's : Needs assistance/impaired ?Eating/Feeding: Set up;Sitting ?  ?Grooming: Min guard;Standing ?  ?Upper Body Bathing: Minimal assistance;Sitting ?  ?Lower Body Bathing: Moderate assistance;Sitting/lateral leans;Sit to/from stand ?  ?Upper Body Dressing :  Min guard;Sitting ?  ?Lower Body Dressing: Maximal assistance;Sitting/lateral leans;Sit to/from stand ?  ?Toilet Transfer: Minimal assistance;Rolling walker (2 wheels);Ambulation ?   ?Toileting- Clothing Manipulation and Hygiene: Minimal assistance;Sitting/lateral lean;Sit to/from stand ?  ?  ?  ?Functional mobility during ADLs: Minimal assistance;Rolling walker (2 wheels) ?General ADL Comments: Limited due to balance and weakenss  ? ? ? ?Vision Baseline Vision/History: 1 Wears glasses ?Ability to See in Adequate Light: 1 Impaired ?Patient Visual Report: No change from baseline ?Vision Assessment?: Vision impaired- to be further tested in functional context ?Additional Comments: Some R sided peripheral vision deficits noted with mobility  ?   ?Perception   ?  ?Praxis   ?  ? ?Pertinent Vitals/Pain Pain Assessment ?Pain Assessment: No/denies pain  ? ? ? ?Hand Dominance Right ?  ?Extremity/Trunk Assessment Upper Extremity Assessment ?Upper Extremity Assessment: Generalized weakness ?  ?Lower Extremity Assessment ?Lower Extremity Assessment: Generalized weakness ?  ?Cervical / Trunk Assessment ?Cervical / Trunk Assessment: Kyphotic ?  ?Communication Communication ?Communication: No difficulties ?  ?Cognition Arousal/Alertness: Awake/alert ?Behavior During Therapy: [facility] for tasks assessed/performed ?Overall Cognitive Status: Within Functional Limits for tasks assessed ?  ?  ?  ?  ?  ?  ?  ?  ?  ?  ?  ?  ?  ?  ?  ?  ?  ?  ?  ?General Comments  Pt very anxious with OOB mobility ? ?  ?Exercises   ?  ?Shoulder Instructions    ? ? ?Home Living Family/patient expects to be discharged to:: Assisted living ?Living Arrangements: Spouse/significant other (who has dementia) ?Available Help at Discharge: Available PRN/intermittently (dtr close by to provide intermittent assist.  Presently caring for her Mom.) ?Type of Home: Assisted living ?Home Access: Level entry ?  ?  ?Home Layout: One level ?  ?  ?Bathroom Shower/Tub: Walk-in shower (pt takes sponge baths) ?  ?Bathroom Toilet: Handicapped height ?Bathroom Accessibility: Yes ?How Accessible: Accessible via wheelchair;Accessible via walker ?Home Equipment:  Rolling Walker (2 wheels);BSC/3in1;Grab bars - toilet;Shower seat - built in;Grab bars - tub/shower;Hand held shower head ?  ?  ?  ? ?  ?Prior Functioning/Environment Prior Level of Function : Independent/Modified Independent (on a home-like environment.  Caring for his wife who is mobile, but has dementia) ?  ?  ?  ?  ?  ?  ?  ?  ?  ? ?  ?  ?OT Problem List: Decreased strength;Decreased activity tolerance;Impaired balance (sitting and/or standing);Decreased coordination;Decreased safety awareness;Decreased knowledge of use of DME or AE ?  ?   ?OT Treatment/Interventions: Self-care/ADL training;Therapeutic exercise;Energy conservation;DME and/or AE instruction;Therapeutic activities;Patient/family education;Balance training  ?  ?OT Goals(Current goals can be found in the care plan section) Acute Rehab OT Goals ?Patient Stated Goal: to go home ?OT Goal Formulation: With patient ?Time For Goal Achievement: 12/11/21 ?Potential to Achieve Goals: Good ?ADL Goals ?Pt Will Perform Grooming: with modified independence;standing ?Pt Will Perform Lower Body Bathing: with modified independence;sitting/lateral leans;sit to/from stand ?Pt Will Perform Lower Body Dressing: with modified independence;sitting/lateral leans;sit to/from stand ?Pt Will Transfer to Toilet: with modified independence;ambulating ?Pt Will Perform Toileting - Clothing Manipulation and hygiene: with modified independence;sitting/lateral leans;sit to/from stand  ?OT Frequency: Min 2X/week ?  ? ?Co-evaluation   ?  ?  ?  ?  ? ?  ?AM-PAC OT "6 Clicks" Daily Activity     ?Outcome Measure Help from another person eating meals?: A Little ?Help from another person taking care of personal grooming?: A  Little ?Help from another person toileting, which includes using toliet, bedpan, or urinal?: A Lot ?Help from another person bathing (including washing, rinsing, drying)?: A Lot ?Help from another person to put on and taking off regular upper body clothing?: A  Little ?Help from another person to put on and taking off regular lower body clothing?: A Lot ?6 Click Score: 15 ?  ?End of Session Equipment Utilized During Treatment: Gait belt;Rolling walker (2 wheels) ?Nurse Communication:

## 2021-11-27 NOTE — Progress Notes (Signed)
Patient expressed sadness and lack of desire to continue life. He stated he is not morbid and has NO PLANS to hurt himself, but he is concerned about being a burden and his wife, who has dementia not getting the care she needs. We talked about his stroke and his current limitations. We discussed family and what the resources are/ needs are and the patient began to feel better. He agreed to go a day at a time and give himself a fair chance to progress. RN spoke with daughter. Family and ECHO are at the bedside.   ?

## 2021-11-28 DIAGNOSIS — I6381 Other cerebral infarction due to occlusion or stenosis of small artery: Secondary | ICD-10-CM | POA: Diagnosis not present

## 2021-11-28 DIAGNOSIS — I639 Cerebral infarction, unspecified: Secondary | ICD-10-CM | POA: Diagnosis not present

## 2021-11-28 NOTE — Progress Notes (Signed)
IP rehab admissions - I spoke with daughter.  Patient has been to CIR previously.  Family are talking with patient now.  Patient may decide to DC directly to ALF and not admit to CIR.  Call for questions.  [phone] ?

## 2021-11-28 NOTE — Progress Notes (Signed)
Physical Therapy Treatment ?Patient Details ?Name: Danny Frederick ?MRN: [id number] ?DOB: 10/22/1933 ?Today's Date: 11/28/2021 ? ? ?History of Present Illness Pt is as 88 y/o male presenting on 3/27 with double vision. MRI + R basal ganglia CVA, R dorsal pontine infarct. PMH includes: HTN pulmonary artery thrombosis, ruptured thoracic aortic aneurysm and repair, foot drop R, prior spine surgery. ? ?  ?PT Comments  ? ? Pt much improved today vs evaluation.  Pt still guarded, but not fearful.  Pt able to gain mobility in the bed and is otherwise min guard for basic mobility, gait and negotiating stairs. ?   ?Recommendations for follow up therapy are one component of a multi-disciplinary discharge planning process, led by the attending physician.  Recommendations may be updated based on patient status, additional functional criteria and insurance authorization. ? ?Follow Up Recommendations ? Other (comment) (Family may elect to d/c home with hospice services and hopefull HHPT service added to work on safe transfer technique.) ?  ?  ?Assistance Recommended at Discharge Frequent or constant Supervision/Assistance  ?Patient can return home with the following A little help with walking and/or transfers;A little help with bathing/dressing/bathroom;Assistance with cooking/housework;Direct supervision/assist for medications management;Direct supervision/assist for financial management;Assist for transportation ?  ?Equipment Recommendations ? BSC/3in1  ?  ?Recommendations for Other Services   ? ? ?  ?Precautions / Restrictions Precautions ?Precautions: Fall  ?  ? ?Mobility ? Bed Mobility ?Overal bed mobility: Needs Assistance ?Bed Mobility: Supine to Sit ?  ?  ?Supine to sit: Min guard ?  ?  ?General bed mobility comments: pt used rail and momentum to get up without assist from a flat bed. ?  ? ?Transfers ?Overall transfer level: Needs assistance ?Equipment used: Rolling walker (2 wheels) ?Transfers: Sit to/from Stand ?Sit to  Stand: Min assist, Min guard ?  ?  ?  ?  ?  ?General transfer comment: pt needs work on safer standing, VC's for hand placement ascending/descending.  pt able to stand without assist from higher surface by significant stabilization against the bed frame.  Worked on trying to come forward more with less reliance on the bedframe.  min from lower surfaces,. ?  ? ?Ambulation/Gait ?Ambulation/Gait assistance: Min guard ?Gait Distance (Feet): 150 Feet ?Assistive device: Rolling walker (2 wheels) ?Gait Pattern/deviations: Step-through pattern ?  ?Gait velocity interpretation: <1.8 ft/sec, indicate of risk for recurrent falls ?  ?General Gait Details: mildly unsteady gait with moderate step length, guarded, but not fearful.  Pt/dtr report not far off from baseline. ? ? ?Stairs ?Stairs: Yes ?Stairs assistance: Min guard ?Stair Management: Two rails, One rail Left, Alternating pattern, Step to pattern, Forwards ?Number of Stairs: 5 ?General stair comments: rails or min/mod assist required, but pulls up the stairs and is biased posteriorly. ? ? ?Wheelchair Mobility ?  ? ?Modified Rankin (Stroke Patients Only) ?Modified Rankin (Stroke Patients Only) ?Modified Rankin: Moderately severe disability (progressing toward 3) ? ? ?  ?Balance Overall balance assessment: Needs assistance ?  ?Sitting balance-Leahy Scale: Good ?  ?  ?  ?Standing balance-Leahy Scale: Poor ?Standing balance comment: reliant on AD or external support. ?  ?  ?  ?  ?  ?  ?  ?  ?  ?  ?  ?  ? ?  ?Cognition Arousal/Alertness: Awake/alert ?Behavior During Therapy: [facility] for tasks assessed/performed ?Overall Cognitive Status: Within Functional Limits for tasks assessed ?  ?  ?  ?  ?  ?  ?  ?  ?  ?  ?  ?  ?  ?  ?  ?  ?  ?  ?  ? ?  ?  Exercises   ? ?  ?General Comments   ?  ?  ? ?Pertinent Vitals/Pain Pain Assessment ?Pain Assessment: No/denies pain  ? ? ?Home Living   ?  ?  ?  ?  ?  ?  ?  ?  ?  ?   ?  ?Prior Function    ?  ?  ?   ? ?PT Goals (current goals can now be  found in the care plan section) Acute Rehab PT Goals ?Patient Stated Goal: I need to be able to care for my wife. ?PT Goal Formulation: With patient ?Time For Goal Achievement: 12/11/21 ?Potential to Achieve Goals: Good ?Progress towards PT goals: Progressing toward goals ? ?  ?Frequency ? ? ? Min 3X/week ? ? ? ?  ?PT Plan Current plan remains appropriate  ? ? ?Co-evaluation   ?  ?  ?  ?  ? ?  ?AM-PAC PT "6 Clicks" Mobility   ?Outcome Measure ? Help needed turning from your back to your side while in a flat bed without using bedrails?: A Little ?Help needed moving from lying on your back to sitting on the side of a flat bed without using bedrails?: A Little ?Help needed moving to and from a bed to a chair (including a wheelchair)?: A Little ?Help needed standing up from a chair using your arms (e.g., wheelchair or bedside chair)?: A Little ?Help needed to walk in hospital room?: A Little ?Help needed climbing 3-5 steps with a railing? : A Little ?6 Click Score: 18 ? ?  ?End of Session   ?Activity Tolerance: Patient tolerated treatment well ?Patient left: Other (comment);with nursing/sitter in room;with family/visitor present;with call bell/phone within reach (let on the toilet in the bathroom) ?Nurse Communication: Mobility status ?PT Visit Diagnosis: Unsteadiness on feet (R26.81);Other abnormalities of gait and mobility (R26.89);Difficulty in walking, not elsewhere classified (R26.2);Other symptoms and signs involving the nervous system (R29.898) ?  ? ? ?Time: 1236-1310 ?PT Time Calculation (min) (ACUTE ONLY): 34 min ? ?Charges:  $Gait Training: 8-22 mins ?$Therapeutic Activity: 8-22 mins          ?          ? ?11/28/2021 ? ?Jacinto Halim., PT ?Acute Rehabilitation Services ?[phone]  (pager) ?[phone]  (office) ? ? ?Eliseo Gum [place] ?11/28/2021, 1:25 PM ? ?

## 2021-11-28 NOTE — Care Management Important Message (Signed)
Important Message ? ?Patient Details  ?Name: Danny Frederick ?MRN: [id number] ?Date of Birth: 07-27-1934 ? ? ?Medicare Important Message Given:  Yes ? ? ? ? ?Treshun Wold ?11/28/2021, 2:28 PM ?

## 2021-11-28 NOTE — Progress Notes (Addendum)
[facility]) Hospital Liaison: RN note   ? ?Notified by Transition of Care Manger of patient/family request for [facility] services at [facility] after discharge. Chart and patient information under review by [facility] physician. Hospice eligibility pending currently.   ? ?Writer spoke with  Ivin Booty to initiate education related to hospice philosophy, services and team approach to care.  Ivin Booty verbalized understanding of information given. Per discussion, plan is for discharge to [facility] by [place].    ? ?Please send signed and completed DNR form home with patient/family. Patient will need prescriptions for discharge comfort medications.    ? ?Currently no DME needed to discharge back to the facility. He has a walker and wheelchair. Facility address has been verified and is correct in the chart.   ?  ?[facility] aware of the above. Please notify [facility] when patient is ready to leave the unit at discharge. (Call [phone] or [phone] after 5pm.) [facility] information and contact numbers given to  [facility].     ? ?Please call with any hospice related questions.    ? ?Thank you for this referral.  ?   ?Clementeen Hoof, BSN, RN ? Hospital Liaison (listed on [facility] under Hospice and [place])  ?[phone]  ?[phone] ?

## 2021-11-28 NOTE — Progress Notes (Addendum)
STROKE TEAM PROGRESS NOTE  ? ?INTERVAL HISTORY ?Patient is seen in his room with no family at the bedside.   ?2D Echo- EF 54%, Grade 1 diastolic dysfunction, mildly dilated right atria ?Awaiting rehab placement, stroke workup completed ?Neurological exam is stable and unchanged.  Neurological exam.  seems to be improving and there is slight medial movement of the right eye when he looks to the left.  No subjective diplopia ?Vitals:  ? 11/27/21 2006 11/27/21 2311 11/28/21 0346 11/28/21 0086  ?BP: 116/62 135/76 135/71 (!) 155/79  ?Pulse: 71 80 77 70  ?Resp: '16 20 16 14  '$ ?Temp: 98.6 ?F (37 ?C) 98.8 ?F (37.1 ?C) 98.5 ?F (36.9 ?C) 98.8 ?F (37.1 ?C)  ?TempSrc: Oral Oral Oral Oral  ?SpO2: 96% 95% 93% 97%  ? ?CBC:  ?Recent Labs  ?Lab 11/25/21 ?1619 11/26/21 ?7619  ?WBC 8.3 9.2  ?NEUTROABS 5.3 5.9  ?HGB 12.0* 13.0  ?HCT 36.7* 40.3  ?MCV 89.1 88.8  ?PLT 368 358  ? ?Basic Metabolic Panel:  ?Recent Labs  ?Lab 11/25/21 ?1619 11/26/21 ?5093  ?NA 136 137  ?K 4.1 3.7  ?CL 105 102  ?CO2 24 25  ?GLUCOSE 99 92  ?BUN 20 12  ?CREATININE 1.01 0.77  ?CALCIUM 9.3 9.4  ?MG  --  1.9  ? ?Lipid Panel:  ?Recent Labs  ?Lab 11/26/21 ?2671  ?CHOL 188  ?TRIG 78  ?HDL 64  ?CHOLHDL 2.9  ?VLDL 16  ?LDLCALC 108*  ? ?HgbA1c:  ?Recent Labs  ?Lab 11/26/21 ?2458  ?HGBA1C 5.5  ? ?Urine Drug Screen: No results for input(s): LABOPIA, COCAINSCRNUR, LABBENZ, AMPHETMU, THCU, LABBARB in the last 168 hours.  ?Alcohol Level No results for input(s): ETH in the last 168 hours. ? ?IMAGING past 24 hours ?ECHOCARDIOGRAM COMPLETE BUBBLE STUDY ? ?Result Date: 11/27/2021 ?   ECHOCARDIOGRAM REPORT   Patient Name:   KNOX CERVI Date of Exam: 11/27/2021 Medical Rec #:  [id number]       Height:       63.0 in Accession #:    [id number]      Weight:       130.0 lb Date of Birth:  1934/07/02       BSA:          1.610 m? Patient Age:    88 years        BP:           80/56 mmHg Patient Gender: M               HR:           79 bpm. Exam Location:  Inpatient Procedure: 2D Echo,  Cardiac Doppler and Color Doppler Indications:    Stroke I63.9  History:        Patient has prior history of Echocardiogram examinations, most                 recent 01/14/2021. Risk Factors:Hypertension, Dyslipidemia and                 Former Smoker.  Sonographer:    Alvino Chapel RCS Referring Phys: [id number] Beemer  Sonographer Comments: Technically difficult study due to poor echo windows. Image acquisition challenging due to respiratory motion. IMPRESSIONS  1. Left ventricular ejection fraction, by estimation, is 55%. The left ventricle has normal function. The left ventricle has no regional wall motion abnormalities. There is mild left ventricular hypertrophy. Left ventricular diastolic parameters are consistent with Grade I diastolic dysfunction (impaired relaxation).  There is respiratory related leftward septal shift with inspiration, which may be due to constrictive physiology or underlying lung disease.  2. Right ventricular systolic function is mildly reduced. The right ventricular size is moderately enlarged. Tricuspid regurgitation signal is inadequate for assessing PA pressure.  3. Right atrial size was mild to moderately dilated.  4. The mitral valve is grossly normal. Trivial mitral valve regurgitation.  5. The aortic valve is abnormal. There is moderate calcification of the aortic valve. Aortic valve regurgitation is not visualized. No aortic stenosis is present.  6. The inferior vena cava is normal in size with greater than 50% respiratory variability, suggesting right atrial pressure of 3 mmHg.  7. Agitated saline contrast bubble study was negative, with no evidence of any interatrial shunt. Conclusion(s)/Recommendation(s): No intracardiac source of embolism detected on this transthoracic study. Consider a transesophageal echocardiogram to exclude cardiac source of embolism if clinically indicated. FINDINGS  Left Ventricle: Left ventricular ejection fraction, by estimation, is 55%. The  left ventricle has normal function. The left ventricle has no regional wall motion abnormalities. Definity contrast agent was given IV to delineate the left ventricular endocardial borders. The left ventricular internal cavity size was normal in size. There is mild left ventricular hypertrophy. Respiratory related leftward septal shift with inspiration, which may be due to constrictive physiology or underlying lung disease. Left ventricular diastolic parameters are consistent with Grade I diastolic dysfunction (impaired relaxation). Right Ventricle: The right ventricular size is moderately enlarged. Right vetricular wall thickness was not well visualized. Right ventricular systolic function is mildly reduced. Tricuspid regurgitation signal is inadequate for assessing PA pressure. The tricuspid regurgitant velocity is 1.87 m/s, and with an assumed right atrial pressure of 3 mmHg, the estimated right ventricular systolic pressure is 56.4 mmHg. Left Atrium: Left atrial size was normal in size. Right Atrium: Right atrial size was mild to moderately dilated. Pericardium: Trivial pericardial effusion is present. Mitral Valve: The mitral valve is grossly normal. Trivial mitral valve regurgitation. Tricuspid Valve: The tricuspid valve is grossly normal. Tricuspid valve regurgitation is mild. Aortic Valve: The aortic valve is abnormal. There is moderate calcification of the aortic valve. Aortic valve regurgitation is not visualized. No aortic stenosis is present. Pulmonic Valve: The pulmonic valve was grossly normal. Pulmonic valve regurgitation is not visualized. Aorta: The aortic root is normal in size and structure. Venous: The inferior vena cava is normal in size with greater than 50% respiratory variability, suggesting right atrial pressure of 3 mmHg. IAS/Shunts: The atrial septum is grossly normal. Agitated saline contrast was given intravenously to evaluate for intracardiac shunting. Agitated saline contrast bubble  study was negative, with no evidence of any interatrial shunt.  LEFT VENTRICLE PLAX 2D LVIDd:         3.67 cm   Diastology LVIDs:         2.28 cm   LV e' medial:    3.81 cm/s LV PW:         0.85 cm   LV E/e' medial:  12.2 LV IVS:        1.10 cm   LV e' lateral:   5.44 cm/s LVOT diam:     1.90 cm   LV E/e' lateral: 8.5 LV SV:         29 LV SV Index:   18 LVOT Area:     2.84 cm?  RIGHT VENTRICLE TAPSE (M-mode): 1.1 cm LEFT ATRIUM             Index  RIGHT ATRIUM           Index LA diam:        2.20 cm 1.37 cm/m?   RA Area:     18.70 cm? 10.81 cm?/m? LA Vol (A2C):   21.9 ml 13.60 ml/m?  RA Volume:   53.40 ml  33.16 ml/m? LA Vol (A4C):   20.6 ml 12.79 ml/m? LA Biplane Vol: 21.0 ml 13.04 ml/m?  AORTIC VALVE LVOT Vmax:   58.30 cm/s LVOT Vmean:  36.200 cm/s LVOT VTI:    0.102 m  AORTA Ao Root diam: 3.50 cm MITRAL VALVE               TRICUSPID VALVE MV Area (PHT): 2.76 cm?    TR Peak grad:   14.0 mmHg MV Decel Time: 275 msec    TR Vmax:        187.00 cm/s MV E velocity: 46.50 cm/s MV A velocity: 65.40 cm/s  SHUNTS MV E/A ratio:  0.71        Systemic VTI:  0.10 m                            Systemic Diam: 1.90 cm Cherlynn Kaiser MD Electronically signed by Cherlynn Kaiser MD Signature Date/Time: 11/27/2021/4:57:46 PM    Final    ? ?PHYSICAL EXAM ?General:  Alert, well-developed, well-nourished elderly Caucasian male patient in no acute distress ?Respiratory: Regular, unlabored respirations on room air ? ?NEURO:  ?Mental Status: AA&Ox3  ?Speech/Language: speech is without dysarthria or aphasia.  Fluency and comprehension intact. ? ?Cranial Nerves:  ?II: PERRL. Visual fields full.  ?III, IV, VI: Right sided INO present with weak adduction of the right eye with few beats of end gaze nystagmus while looking to the left ?V: Sensation is intact to light touch and symmetrical to face.  ?VII: Smile is symmetrical.   ?VIII: hearing intact to voice. ?IX, X: Phonation is normal.  ?XII: tongue is midline without  fasciculations. ?Motor: 5/5 strength to all muscle groups tested.  ?Sensation- Intact to light touch bilaterally. ?Coordination: FTN intact bilaterally.No drift.  ?Gait- deferred ? ? ? ?ASSESSMENT/PLAN ?Mr. Haydn Hutsell is a 9

## 2021-11-28 NOTE — Progress Notes (Signed)
? ?Progress note ? ?Patient: Danny Frederick MRN: [id number] DOA: 11/25/2021 ? ?Date of Service: the patient was seen and examined on 11/28/2021 ? ?Patient coming from: Home via EMS ? ?Chief Complaint:  ?Chief Complaint  ?Patient presents with  ? Eye Problem  ? ? ?HPI:  ? ?88 year old male with past medical history of acute hemorrhagic stroke involving the posterior left basal ganglia 12/2018, hypertension, benign prostatic hyperplasia, ascending aortic aneurysm status postrepair in 2015, hyperlipidemia and hypothyroidism who presents to [facility] via EMS with complaints of double vision. ? ?Patient explains that on Saturday yesterday he was eating lunch when he attempted to reach for the juice on the table but found out that he could not due to a sudden visual change.  Patient noticed that he was seeing double and I was not able to see objects appropriately on his left side.  Patient denies any associated headaches, facial droop, unsteady gait, focal weakness or slurred speech. ? ?Patient allowed his symptoms to persist throughout the day and the following day until Monday.   When symptoms were unrelenting EMS was contacted and promptly came to evaluate the patient and brought him into [facility] emergency department for evaluation. ? ?Upon evaluation in the emergency department due to identified neurologic deficits on exam MRI brain was obtained revealing an acute right basal ganglia infarct.  ER provider then discussed the case with Dr. Lorrin Goodell with neurology who agreed to see the patient in consultation.  The hospitalist group was then called to assess the patient for admission the hospital. ? ?Subjective ?Has no complaints ? ?Physical Exam: ? ?Vitals:  ? 11/27/21 2311 11/28/21 0346 11/28/21 0722 11/28/21 1132  ?BP: 135/76 135/71 (!) 155/79 (!) 161/85  ?Pulse: 80 77 70 69  ?Resp: '20 16 14 18  '$ ?Temp: 98.8 ?F (37.1 ?C) 98.5 ?F (36.9 ?C) 98.8 ?F (37.1 ?C) 98.7 ?F (37.1 ?C)  ?TempSrc: Oral Oral  Oral   ?SpO2: 95% 93% 97% 98%  ? ? ?Constitutional: Awake alert and oriented x3, no associated distress.   ?Skin: no rashes, no lesions, good skin turgor noted. ?Eyes: Pupils are equally reactive to light.  No evidence of scleral icterus or conjunctival pallor.  ?ENMT: Moist mucous membranes noted.  Posterior pharynx clear of any exudate or lesions.   ?Neck: normal, supple, no masses, no thyromegaly.  No evidence of jugular venous distension.   ?Respiratory: clear to auscultation bilaterally, no wheezing, no crackles. Normal respiratory effort. No accessory muscle use.  ?Cardiovascular: Regular rate and rhythm, no murmurs / rubs / gallops. No extremity edema. 2+ pedal pulses. No carotid bruits.  ?Chest:   Nontender without crepitus or deformity.   ?Back:   Nontender without crepitus or deformity. ?Abdomen: Abdomen is soft and nontender.  No evidence of intra-abdominal masses.  Positive bowel sounds noted in all quadrants.   ?Musculoskeletal: No joint deformity upper and lower extremities. Good ROM, no contractures. Normal muscle tone.  ?Neurologic: Evidence of internuclear opathlmoplegia.  Othwerwise, remainder of CN 2-12 grossly intact. Sensation intact.  Patient moving all 4 extremities spontaneously.  Patient is following all commands.  Patient is responsive to verbal stimuli.   ?Psychiatric: Patient exhibits normal mood with appropriate affect.  Patient seems to possess insight as to their current situation.   ? ?Data Reviewed: ? ?I have personally reviewed and interpreted labs, imaging. ? ?Significant findings are hemoglobin 12.0, white blood cell count 8.3. ? ?MRI brain without contrast performed revealed 7 mm acute ischemic nonhemorrhagic anterior right  basal ganglia infarct with additional punctate 4 mm acute ischemic nonhemorrhagic right dorsal pontine infarct. ? ?EKG: Personally reviewed.  Rhythm is normal sinus rhythm with heart rate of 82 bpm.  PACs noted.  no dynamic ST segment changes  appreciated. ? ? ?Assessment and Plan: ?* Stroke of right basal ganglia (HCC) ?MRI of the brain without contrast confirming right basal ganglia and right dorsal pontine infarcts ?Performing serial neurologic checks ?Monitoring patient on telemetry ?Initiating antiplatelet therapy including aspirin 81 mg daily per neurology recommendations and Plavix ?Daily statin therapy will be initiated if LDL is greater than 70 ?Hemoglobin A1c 5.5% LDL 108 ?Echo normal ?permissive hypertension with as needed antihypertensives only to be given if blood pressure greater than 220/115 ?Neurology following in consultation.   ?Right ICA 40 to 59% stenosis evaluated by vascular surgery no surgery at this time ? ? ?Essential hypertension ?Holding home antihypertensive therapy for brief period of time for permissive hypertension ?Home antihypertensive therapy will be gradually reinitiated in the next 24 to 48 hours ?As needed intravenous antihypertensives for markedly elevated blood pressures. ? ?Hypothyroidism ?Resume home regimen of Synthroid ? ? ?Mixed hyperlipidemia ?Continuing home regimen of lipid lowering therapy. ?Obtaining lipid panel and will titrate statin therapy as necessary to maintain LDL less than 70 ? ? ?GERD without esophagitis ?Continuing home regimen of daily PPI therapy. ? ? ? ?Patient medically stable for discharge awaiting rehab bed. ? ? ?Code Status:  DNR  code status decision has been confirmed with: patient ? ?Consults: Dr. Lorrin Goodell with Neurology ? ?De Libman A MD ? ?11/28/2021 11:49 AM ? ?

## 2021-11-28 NOTE — TOC Initial Note (Signed)
Transition of Care (TOC) - Initial/Assessment Note  ? ? ?Patient Details  ?Name: Danny Frederick ?MRN: [id number] ?Date of Birth: 28-Jun-1934 ? ?Transition of Care [facility]) CM/SW Contact:    ?Baldemar Lenis, LCSW ?Phone Number: ?11/28/2021, 2:00 PM ? ?Clinical Narrative:          CSW alerted by RN of family at bedside asking about hospice care at ALF. CSW met with family at bedside to discuss, and they have already discussed with [facility] at [facility] about setting up hospice care on site for patient to return. CSW discussed hospice referral to [facility] and received permission to send referral and speak with Software engineer. CSW spoke with Deanna Artis at [facility] to confirm plan to return with hospice care in place, and CSW provided referral to [facility]. Patient can discharge to [facility] tomorrow once hospice is in place. CSW to follow.       ? ? ?Expected Discharge Plan: Home w Hospice Care ?Barriers to Discharge: Continued Medical Work up ? ? ?Patient Goals and CMS Choice ?Patient states their goals for this hospitalization and ongoing recovery are:: to get back home ?CMS Medicare.gov Compare Post Acute Care list provided to:: Patient ?Choice offered to / list presented to : Patient, Adult Children, Spouse ? ?Expected Discharge Plan and Services ?Expected Discharge Plan: Home w Hospice Care ?  ?  ?Post Acute Care Choice: Hospice ?Living arrangements for the past 2 months: Assisted Living Facility ?                ?  ?  ?  ?  ?  ?  ?  ?  ?  ?  ? ?Prior Living Arrangements/Services ?Living arrangements for the past 2 months: Assisted Living Facility ?Lives with:: Facility Resident, Spouse ?Patient language and need for interpreter reviewed:: No ?Do you feel safe going back to the place where you live?: Yes      ?Need for Family Participation in Patient Care: No (Comment) ?Care giver support system in place?: Yes (comment) ?Current home services: DME ?Criminal Activity/Legal Involvement Pertinent to Current  Situation/Hospitalization: No - Comment as needed ? ?Activities of Daily Living ?  ?  ? ?Permission Sought/Granted ?Permission sought to share information with : Facility Medical sales representative, Family Supports ?Permission granted to share information with : Yes, Verbal Permission Granted ? Share Information with NAME: Jasmine December ? Permission granted to share info w AGENCY: [facility] at [place], [place] ? Permission granted to share info w Relationship: Daughter ?   ? ?Emotional Assessment ?Appearance:: Appears stated age ?Attitude/Demeanor/Rapport: Engaged ?Affect (typically observed): Appropriate ?Orientation: : Oriented to Self, Oriented to Place, Oriented to  Time, Oriented to Situation ?Alcohol / Substance Use: Not Applicable ?Psych Involvement: No (comment) ? ?Admission diagnosis:  Acute stroke due to ischemia [facility]) [I63.9] ?Patient Active Problem List  ? Diagnosis Date Noted  ? Stroke of right basal ganglia (HCC) 11/25/2021  ? DNR (do not resuscitate) 10/06/2019  ? Mixed hyperlipidemia 12/13/2018  ? Family hx-stroke 12/13/2018  ? ICH (intracerebral hemorrhage) (HCC) - L BG 12/11/2018  ? Lip laceration 12/07/2018  ? Impaired fasting glucose 12/06/2018  ? Rotator cuff tear arthropathy 05/04/2018  ? Spinal stenosis of lumbar region 09/08/2017  ? Atherosclerosis of aorta (HCC) 11/08/2015  ? Recurrent Clostridium difficile diarrhea   ? Bacteremia   ? S/P ascending aortic aneurysm repair 01/24/2014  ? Ruptured thoracic aortic aneurysm 01/23/2014  ? 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 Right ventricular dysfunction 01/23/2014  ? Solitary pulmonary nodule 12/08/2013  ?  Chest pain 12/02/2013  ? Allergic conjunctivitis 10/20/2012  ? Syncope 01/22/2012  ? Hypothyroidism 01/22/2011  ? Essential hypertension, benign 01/22/2011  ? GERD without esophagitis 01/22/2011  ? Iron deficiency anemia 01/22/2011  ? IRON DEFICIENCY ANEMIA SECONDARY TO BLOOD LOSS 11/08/2008  ? ANEMIA 05/26/2008  ? Essential hypertension  03/02/2008  ? DYSPNEA 03/02/2008  ? ?PCP:  [name], MD ?Pharmacy:   ?[facility]) - [place], [place] - [address] ?[address] [place] ?[address] ?[place] [address] ?Phone: [phone] Fax: [phone] ? ?[id number] - [place], [place] - [address] AT [address] ?[address] ?[place] [address] ?Phone: [phone] Fax: [phone] ? ? ? ? ?Social Determinants of Health (SDOH) Interventions ?  ? ?Readmission Risk Interventions ?   ? View : No data to display.  ?  ?  ?  ? ? ? ?

## 2021-11-29 ENCOUNTER — Telehealth: Payer: Self-pay | Admitting: Family Medicine

## 2021-11-29 DIAGNOSIS — I6381 Other cerebral infarction due to occlusion or stenosis of small artery: Secondary | ICD-10-CM | POA: Diagnosis not present

## 2021-11-29 DIAGNOSIS — I639 Cerebral infarction, unspecified: Secondary | ICD-10-CM | POA: Diagnosis not present

## 2021-11-29 MED ORDER — ATORVASTATIN CALCIUM 40 MG PO TABS: 40.0000 mg | ORAL_TABLET | Freq: Every day | ORAL | 0 refills | Status: AC

## 2021-11-29 MED ORDER — CLOPIDOGREL BISULFATE 75 MG PO TABS: 75.0000 mg | ORAL_TABLET | Freq: Every day | ORAL | 0 refills | Status: AC

## 2021-11-29 MED ORDER — ALPRAZOLAM 0.25 MG PO TABS: 0.2500 mg | ORAL_TABLET | Freq: Once | ORAL | Status: DC

## 2021-11-29 MED ORDER — ASPIRIN 81 MG PO TBEC: 81.0000 mg | DELAYED_RELEASE_TABLET | Freq: Every day | ORAL | 11 refills | Status: AC

## 2021-11-29 NOTE — Consult Note (Addendum)
? ?  [facility] CM Inpatient Consult ? ? ?11/29/2021 ? ?Danny Frederick ?12-Dec-1933 ? ? ?[facility] Customer service manager [[employer]]  Accountable Care Organization [ACO] [name] ? ?Primary Care Provider:  [name], MD with [facility], is listed for the transition of care follow up and appointments. ? ?Patient was screened for post hospital follow up needs. Patient is from [facility] ? ?Met with the patient at the bedside, HOH, patient had just worked with PT and was doing well, now up in the recliner. Explained [facility] Care Management and given a 24 hour nurse advise line magnet.  Patient states he thinks he is returning home today and wouldn't mind the follow up.  "Just let them know I am hard of hearing." ? ?Plan: A referral for post hospital Care Management follow up support and needs. ? ?Please contact for further questions, ? ?Charlesetta Shanks, RN BSN CCM ?[facility] [employer] Liaison ? [phone] business mobile phone ?Toll free office [phone]  ?Fax number: [phone] ?[email or web address] ? ? ? ?

## 2021-11-29 NOTE — Discharge Summary (Signed)
Physician Discharge Summary  ?Danny Frederick RDE:[id number] DOB: Mar 14, 1934 DOA: 11/25/2021 ? ?PCP: Rita Ohara, MD ? ?Admit date: 11/25/2021 ?Discharge date: 11/29/2021 ? ?Time spent: 40 minutes ? ?Recommendations for Outpatient Follow-up:  ?Follow-up with primary care physician in 1 to 2 weeks ?Follow-up with Dr. Leonie Man neurologist in 2 months ? ? ?Discharge Diagnoses:  ?Principal Problem: ?  Stroke of right basal ganglia ([place]) ?Active Problems: ?  Essential hypertension ?  Hypothyroidism ?  Mixed hyperlipidemia ?  GERD without esophagitis ? ? ?Discharge Condition: Stable ? ? ?There were no vitals filed for this visit. ? ?History of present illness:  ?88 year old male with past medical history of acute hemorrhagic stroke involving the posterior left basal ganglia 12/2018, hypertension, benign prostatic hyperplasia, ascending aortic aneurysm status postrepair in 2015, hyperlipidemia and hypothyroidism who presents to [facility] via EMS with complaints of double vision. ?  ?Patient explains that on Saturday yesterday he was eating lunch when he attempted to reach for the juice on the table but found out that he could not due to a sudden visual change.  Patient noticed that he was seeing double and I was not able to see objects appropriately on his left side.  Patient denies any associated headaches, facial droop, unsteady gait, focal weakness or slurred speech. ?  ?Patient allowed his symptoms to persist throughout the day and the following day until Monday.   When symptoms were unrelenting EMS was contacted and promptly came to evaluate the patient and brought him into [facility] emergency department for evaluation. ?  ?Upon evaluation in the emergency department due to identified neurologic deficits on exam MRI brain was obtained revealing an acute right basal ganglia infarct.  ER provider then discussed the case with Dr. Lorrin Goodell with neurology who agreed to see the patient in consultation.  The  hospitalist group was then called to assess the patient for admission the hospital. ? ?Hospital Course:  ?Patient was followed by the stroke team.  MRI of the brain confirming right basal ganglia and right dorsal pontine infarcts.  Initiate antiplatelet treatment with aspirin 81 mg and Plavix 75 mg for 3 weeks then aspirin alone.  LDL 108 hemoglobin A1c 5.5%.  Echo normal.  Right ICA stenosis identified vascular surgery recommending no surgery at this time.  Patient doing well overall.  Family is opted to return to his assisted living with involving hospice at this time.  Patient has required no controlled substances during this hospitalization.  He has no complaints of pain.  He is to follow-up with his primary care physician and neurology service if family wishes to do so. ? ? ?Discharge Exam: ?Vitals:  ? 11/29/21 0403 11/29/21 0827  ?BP: 130/77 (!) 146/78  ?Pulse: 65 69  ?Resp: 19 14  ?Temp: 98.2 ?F (36.8 ?C) 98.2 ?F (36.8 ?C)  ?SpO2: 99% 97%  ? ? ?General: Alert no apparent distress ?Cardiovascular: Regular rate and rhythm without murmurs rubs or gallops ?Respiratory: Clear to auscultation bilaterally no wheezes rhonchi rales ? ?Discharge Instructions ? ? ?Discharge Instructions   ? ? Diet - low sodium heart healthy   Complete by: As directed ?  ? Discharge instructions   Complete by: As directed ?  ? Follow up with dr Leonie Man neurologist in 2 months ?Follow up with PCP in 1-2 weeks  ? Increase activity slowly   Complete by: As directed ?  ? ?  ? ?Allergies as of 11/29/2021   ?No Known Allergies ?  ? ?  ?Medication  List  ?  ? ?TAKE these medications   ? ?acetaminophen 500 MG tablet ?Commonly known as: TYLENOL ?Take 1,000 mg by mouth every 8 (eight) hours as needed for mild pain. ?  ?amLODipine 5 MG tablet ?Commonly known as: NORVASC ?TAKE 1 TABLET BY MOUTH  TWICE DAILY ?  ?aspirin 81 MG EC tablet ?Take 1 tablet (81 mg total) by mouth daily. Swallow whole. ?  ?atorvastatin 40 MG tablet ?Commonly known as:  LIPITOR ?Take 1 tablet (40 mg total) by mouth daily. ?What changed:  ?medication strength ?how much to take ?  ?citalopram 20 MG tablet ?Commonly known as: CELEXA ?Take 1 tablet (20 mg total) by mouth daily. ?What changed: how much to take ?  ?clopidogrel 75 MG tablet ?Commonly known as: PLAVIX ?Take 1 tablet (75 mg total) by mouth daily. ?  ?fexofenadine 180 MG tablet ?Commonly known as: ALLEGRA ?Take 180 mg by mouth daily as needed for allergies. ?  ?irbesartan 300 MG tablet ?Commonly known as: AVAPRO ?TAKE 1 TABLET BY MOUTH  DAILY ?  ?Iron 325 (65 Fe) MG Tabs ?Take 1 tablet (325 mg total) by mouth every other day. Reported on 11/08/2015 ?  ?loperamide 2 MG tablet ?Commonly known as: IMODIUM A-D ?Take 2 mg by mouth 3 (three) times daily as needed for diarrhea or loose stools. ?  ?memantine 10 MG tablet ?Commonly known as: NAMENDA ?Take 10 mg by mouth 2 (two) times daily. ?  ?metoprolol tartrate 25 MG tablet ?Commonly known as: LOPRESSOR ?TAKE 1 TABLET BY MOUTH  TWICE DAILY ?  ?omeprazole 20 MG capsule ?Commonly known as: PRILOSEC ?TAKE 1 CAPSULE BY MOUTH  EVERY OTHER DAY ?What changed:  ?how much to take ?how to take this ?when to take this ?additional instructions ?  ?Synthroid 50 MCG tablet ?Generic drug: levothyroxine ?TAKE 1 TABLET BY MOUTH  DAILY BEFORE BREAKFAST ?What changed:  ?how much to take ?when to take this ?  ?traMADol 50 MG tablet ?Commonly known as: ULTRAM ?Take 25 mg by mouth 2 (two) times daily. ?  ?Vitamin D3 50 MCG (2000 UT) Tabs ?Take 2,000 Units by mouth daily. ?  ? ?  ? ?No Known Allergies ? ? ? ?The results of significant diagnostics from this hospitalization (including imaging, microbiology, ancillary and laboratory) are listed below for reference.   ? ?Significant Diagnostic Studies: ?CT ANGIO HEAD NECK W WO CM ? ?Result Date: 11/25/2021 ?CLINICAL DATA:  Initial evaluation for diplopia. EXAM: CT ANGIOGRAPHY HEAD AND NECK TECHNIQUE: Multidetector CT imaging of the head and neck was performed  using the standard protocol during bolus administration of intravenous contrast. Multiplanar CT image reconstructions and MIPs were obtained to evaluate the vascular anatomy. Carotid stenosis measurements (when applicable) are obtained utilizing NASCET criteria, using the distal internal carotid diameter as the denominator. RADIATION DOSE REDUCTION: This exam was performed according to the departmental dose-optimization program which includes automated exposure control, adjustment of the mA and/or kV according to patient size and/or use of iterative reconstruction technique. CONTRAST:  37m OMNIPAQUE IOHEXOL 350 MG/ML SOLN COMPARISON:  Head CT from earlier the same day. FINDINGS: CTA NECK FINDINGS Aortic arch: Moderate to advanced atheromatous change about the visualized aortic arch. Arch itself is normal in caliber with normal branch pattern. No stenosis about the origin the great vessels. Right carotid system: Right CCA patent to the bifurcation without stenosis. Bulky plaque about the right carotid bulb/proximal right ICA with associated stenosis of up to 75% by NASCET criteria. Right ICA patent distally without  stenosis or dissection. Left carotid system: Left CCA patent from its origin to the bifurcation without stenosis. Calcified plaque about the left carotid bulb/proximal left ICA with associated stenosis of up to 40-50% by NASCET criteria. Left ICA patent distally without stenosis or dissection. Vertebral arteries: Both vertebral arteries arise from the subclavian arteries. No proximal subclavian artery stenosis. Right vertebral artery dominant. Vertebral arteries patent without stenosis or dissection. Skeleton: No discrete or worrisome osseous lesions. Moderate multilevel cervical spondylosis noted. Other neck: No other acute soft tissue abnormality within the neck. Upper chest: Visualized upper chest demonstrates no acute finding. Review of the MIP images confirms the above findings CTA HEAD FINDINGS  Anterior circulation: Petrous segments patent bilaterally. Scattered atheromatous change about the carotid siphons with no more than mild multifocal narrowing. A1 segments patent bilaterally. Normal anterior communicat

## 2021-11-29 NOTE — Telephone Encounter (Signed)
Danny Frederick aware that you will be attending doctor for him. I call the pts. Daughter and she said she did not think he needed to f/u here he seems to be doing ok now. Just want to get him back home for now. She will call back next week if anything changes. She stated he will be home later this afternoon has been discharged but not released yet.  ?

## 2021-11-29 NOTE — Progress Notes (Signed)
Physical Therapy Treatment ?Patient Details ?Name: Danny Frederick ?MRN: [id number] ?DOB: Jun 21, 1934 ?Today's Date: 11/29/2021 ? ? ?History of Present Illness Pt is as 88 y/o male presenting on 3/27 with double vision. MRI + R basal ganglia CVA, R dorsal pontine infarct. PMH includes: HTN pulmonary artery thrombosis, ruptured thoracic aortic aneurysm and repair, foot drop R, prior spine surgery. ? ?  ?PT Comments  ? ? Pt has met goals at a min guard to supervision level.  Emphasis on safety and gait stability/stamina with the RW. ?   ?Recommendations for follow up therapy are one component of a multi-disciplinary discharge planning process, led by the attending physician.  Recommendations may be updated based on patient status, additional functional criteria and insurance authorization. ? ?Follow Up Recommendations ? Home health PT (as provided by hospice) ?  ?  ?Assistance Recommended at Discharge Intermittent Supervision/Assistance  ?Patient can return home with the following A little help with walking and/or transfers;A little help with bathing/dressing/bathroom;Assistance with cooking/housework;Direct supervision/assist for medications management;Direct supervision/assist for financial management;Assist for transportation ?  ?Equipment Recommendations ? BSC/3in1  ?  ?Recommendations for Other Services   ? ? ?  ?Precautions / Restrictions Precautions ?Precautions: Fall ?Restrictions ?Weight Bearing Restrictions: No  ?  ? ?Mobility ? Bed Mobility ?Overal bed mobility: Needs Assistance ?Bed Mobility: Supine to Sit ?Rolling: Min guard ?  ?  ?  ?  ?  ?  ? ?Transfers ?Overall transfer level: Needs assistance ?Equipment used: Rolling walker (2 wheels) ?Transfers: Sit to/from Stand ?Sit to Stand: Min guard (from bed) ?  ?  ?  ?  ?  ?General transfer comment: cues for safe, pt doesn't show carry over ?  ? ?Ambulation/Gait ?Ambulation/Gait assistance: Min guard ?Gait Distance (Feet): 350 Feet ?Assistive device: Rolling  walker (2 wheels) ?Gait Pattern/deviations: Step-through pattern ?  ?Gait velocity interpretation: 1.31 - 2.62 ft/sec, indicative of limited community ambulator ?  ?General Gait Details: mildly unsteady, but again improved over last session.  No LOB, cued to stay closer to the RW, pt does attempt to stay within the diameter of the RW during turns.  Mild SOB, but pt reports not fatigued. ? ? ?Stairs ?  ?  ?  ?  ?  ? ? ?Wheelchair Mobility ?  ? ?Modified Rankin (Stroke Patients Only) ?Modified Rankin (Stroke Patients Only) ?Modified Rankin: Moderate disability ? ? ?  ?Balance   ?Sitting-balance support: No upper extremity supported ?Sitting balance-Leahy Scale: Good ?  ?  ?  ?Standing balance-Leahy Scale: Poor ?Standing balance comment: reliant on AD or external support. ?  ?  ?  ?  ?  ?  ?  ?  ?  ?  ?  ?  ? ?  ?Cognition Arousal/Alertness: Awake/alert ?Behavior During Therapy: [facility] for tasks assessed/performed ?Overall Cognitive Status: Within Functional Limits for tasks assessed ?  ?  ?  ?  ?  ?  ?  ?  ?  ?  ?  ?  ?  ?  ?  ?  ?  ?  ?  ? ?  ?Exercises   ? ?  ?General Comments General comments (skin integrity, edema, etc.): pt reports double vision is resolving, but not gone. ?  ?  ? ?Pertinent Vitals/Pain Pain Assessment ?Pain Assessment: No/denies pain  ? ? ?Home Living   ?  ?  ?  ?  ?  ?  ?  ?  ?  ?   ?  ?Prior Function    ?  ?  ?   ? ?  PT Goals (current goals can now be found in the care plan section) Acute Rehab PT Goals ?Patient Stated Goal: I need to be able to care for my wife. ?PT Goal Formulation: With patient ?Time For Goal Achievement: 12/11/21 ?Potential to Achieve Goals: Good ?Progress towards PT goals: Progressing toward goals ? ?  ?Frequency ? ? ? Min 3X/week ? ? ? ?  ?PT Plan Current plan remains appropriate  ? ? ?Co-evaluation   ?  ?  ?  ?  ? ?  ?AM-PAC PT "6 Clicks" Mobility   ?Outcome Measure ? Help needed turning from your back to your side while in a flat bed without using bedrails?: A Little ?Help  needed moving from lying on your back to sitting on the side of a flat bed without using bedrails?: A Little ?Help needed moving to and from a bed to a chair (including a wheelchair)?: A Little ?Help needed standing up from a chair using your arms (e.g., wheelchair or bedside chair)?: A Little ?Help needed to walk in hospital room?: A Little ?Help needed climbing 3-5 steps with a railing? : A Little ?6 Click Score: 18 ? ?  ?End of Session   ?Activity Tolerance: Patient tolerated treatment well ?Patient left: in chair;with call bell/phone within reach;with chair alarm set ?Nurse Communication: Mobility status ?PT Visit Diagnosis: Unsteadiness on feet (R26.81);Other abnormalities of gait and mobility (R26.89);Difficulty in walking, not elsewhere classified (R26.2);Other symptoms and signs involving the nervous system (R29.898) ?  ? ? ?Time: 3664-4034 ?PT Time Calculation (min) (ACUTE ONLY): 28 min ? ?Charges:  $Gait Training: 8-22 mins ?$Therapeutic Activity: 8-22 mins          ?          ? ?11/29/2021 ? ?Jacinto Halim., PT ?Acute Rehabilitation Services ?[phone]  (pager) ?[phone]  (office) ? ? ?[name] ?11/29/2021, 12:42 PM ? ?

## 2021-11-29 NOTE — Progress Notes (Signed)
IP rehab admissions - noted patient going to DC to [facility] ALF rather than come to CIR.  I will sign off for inpatient rehab at this time.  Call for questions.  [phone] ?

## 2021-11-29 NOTE — Progress Notes (Signed)
RN called report. Receiving report transcriber continued to correct reporter stating she is already aware of patient status and only wants a report if it is accurate. Facility is aware patient is traveling via private car. ?  ?

## 2021-11-29 NOTE — Progress Notes (Signed)
Occupational Therapy Treatment ?Patient Details ?Name: Danny Frederick ?MRN: [id number] ?DOB: Dec 27, 1933 ?Today's Date: 11/29/2021 ? ? ?History of present illness Pt is as 88 y/o male presenting on 3/27 with double vision. MRI + R basal ganglia CVA, R dorsal pontine infarct. PMH includes: HTN pulmonary artery thrombosis, ruptured thoracic aortic aneurysm and repair, foot drop R, prior spine surgery. ?  ?OT comments ? Pt making good progress with OT goals. At this time, pt presented with increased stability and activity tolerance this session,using a RW. Overall he is at a min guard level for all functional mobility and BADL's. Pt reporting that he feels closer to his normal and daughter agrees. Recommendations updated to [facility] services at his ALF. OT will continue to follow acutely.   ? ?Recommendations for follow up therapy are one component of a multi-disciplinary discharge planning process, led by the attending physician.  Recommendations may be updated based on patient status, additional functional criteria and insurance authorization. ?   ?Follow Up Recommendations ? Home health OT  ?  ?Assistance Recommended at Discharge Frequent or constant Supervision/Assistance  ?Patient can return home with the following ? A little help with walking and/or transfers;A lot of help with bathing/dressing/bathroom;Assistance with cooking/housework;Direct supervision/assist for medications management ?  ?Equipment Recommendations ? None recommended by OT  ?  ?Recommendations for Other Services   ? ?  ?Precautions / Restrictions Precautions ?Precautions: Fall ?Restrictions ?Weight Bearing Restrictions: No  ? ? ?  ? ?Mobility Bed Mobility ?  ?  ?  ?  ?  ?  ?  ?General bed mobility comments: Up in recliner on entry ?  ? ?Transfers ?Overall transfer level: Needs assistance ?Equipment used: Rolling walker (2 wheels) ?Transfers: Sit to/from Stand ?Sit to Stand: Min guard ?  ?  ?  ?  ?  ?General transfer comment: no physical assist needed,  cuing for safety as pt attempts to pull up on RW with each stand ?  ?  ?Balance Overall balance assessment: Needs assistance ?Sitting-balance support: No upper extremity supported ?Sitting balance-Leahy Scale: Good ?  ?  ?Standing balance support: Bilateral upper extremity supported, Reliant on assistive device for balance ?Standing balance-Leahy Scale: Poor ?Standing balance comment: reliant on AD or external support. ?  ?  ?  ?  ?  ?  ?  ?  ?  ?  ?  ?   ? ?ADL either performed or assessed with clinical judgement  ? ?ADL Overall ADL's : Needs assistance/impaired ?  ?  ?Grooming: Wash/dry hands;Wash/dry face;Supervision/safety;Standing ?Grooming Details (indicate cue type and reason): Completed at sink, no physical assist needed ?  ?  ?  ?  ?  ?  ?  ?  ?Toilet Transfer: Min guard;Rolling walker (2 wheels) ?Toilet Transfer Details (indicate cue type and reason): Improved stability and tolerance ?Toileting- Clothing Manipulation and Hygiene: Min guard;Sitting/lateral lean ?Toileting - Clothing Manipulation Details (indicate cue type and reason): able to complete from toilet ?  ?  ?Functional mobility during ADLs: Min guard;Rolling walker (2 wheels) ?General ADL Comments: Pt with increased activity tolerance and balance this session. ?  ? ?Extremity/Trunk Assessment   ?  ?  ?  ?  ?  ? ?Vision   ?  ?  ?Perception   ?  ?Praxis   ?  ? ?Cognition Arousal/Alertness: Awake/alert ?Behavior During Therapy: [facility] for tasks assessed/performed ?Overall Cognitive Status: Within Functional Limits for tasks assessed ?  ?  ?  ?  ?  ?  ?  ?  ?  ?  ?  ?  ?  ?  ?  ?  ?  General Comments: Baseline of short term memory deficits ?  ?  ?   ?Exercises   ? ?  ?Shoulder Instructions   ? ? ?  ?General Comments VSS on RA, pt reporting that his R eye continues to give him some difficulties.  ? ? ?Pertinent Vitals/ Pain       Pain Assessment ?Pain Assessment: No/denies pain ? ?Home Living   ?  ?  ?  ?  ?  ?  ?  ?  ?  ?  ?  ?  ?  ?  ?  ?  ?  ?  ? ?   ?Prior Functioning/Environment    ?  ?  ?  ?   ? ?Frequency ? Min 2X/week  ? ? ? ? ?  ?Progress Toward Goals ? ?OT Goals(current goals can now be found in the care plan section) ? Progress towards OT goals: Progressing toward goals ? ?Acute Rehab OT Goals ?Patient Stated Goal: To go home ?OT Goal Formulation: With patient ?Time For Goal Achievement: 12/11/21 ?Potential to Achieve Goals: Good ?ADL Goals ?Pt Will Perform Grooming: with modified independence;standing ?Pt Will Perform Lower Body Bathing: with modified independence;sitting/lateral leans;sit to/from stand ?Pt Will Perform Lower Body Dressing: with modified independence;sitting/lateral leans;sit to/from stand ?Pt Will Transfer to Toilet: with modified independence;ambulating ?Pt Will Perform Toileting - Clothing Manipulation and hygiene: with modified independence;sitting/lateral leans;sit to/from stand  ?Plan Discharge plan remains appropriate;Frequency remains appropriate   ? ?Co-evaluation ? ? ?   ?  ?  ?  ?  ? ?  ?AM-PAC OT "6 Clicks" Daily Activity     ?Outcome Measure ? ? Help from another person eating meals?: A Little ?Help from another person taking care of personal grooming?: A Little ?Help from another person toileting, which includes using toliet, bedpan, or urinal?: A Lot ?Help from another person bathing (including washing, rinsing, drying)?: A Lot ?Help from another person to put on and taking off regular upper body clothing?: A Little ?Help from another person to put on and taking off regular lower body clothing?: A Lot ?6 Click Score: 15 ? ?  ?End of Session Equipment Utilized During Treatment: Gait belt;Rolling walker (2 wheels) ? ?OT Visit Diagnosis: Unsteadiness on feet (R26.81);Other abnormalities of gait and mobility (R26.89);Muscle weakness (generalized) (M62.81) ?  ?Activity Tolerance Patient tolerated treatment well ?  ?Patient Left in chair;with call bell/phone within reach;with nursing/sitter in room;with chair alarm set ?   ?Nurse Communication Mobility status ?  ? ?   ? ?Time: 1610-9604 ?OT Time Calculation (min): 18 min ? ?Charges: OT General Charges ?$OT Visit: 1 Visit ?OT Treatments ?$Therapeutic Activity: 8-22 mins ? ?Idona Stach H., OTR/L ?Acute Rehabilitation ? ?Kallan Merrick Elane Bing Plume ?11/29/2021, 4:53 PM ?

## 2021-11-29 NOTE — Progress Notes (Addendum)
STROKE TEAM PROGRESS NOTE  ? ?INTERVAL HISTORY ?Patient is seen in his room with no family at the bedside.  He has been hemodynamically stable and has had no acute events overnight.  His neurological exam is stable, and is will likely go back to his ALF today.  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 ?Vitals:  ? 11/29/21 0019 11/29/21 0403 11/29/21 0827 11/29/21 1100  ?BP: (!) 143/75 130/77 (!) 146/78 127/63  ?Pulse: 69 65 69 71  ?Resp: '18 19 14 20  '$ ?Temp: 98.2 ?F (36.8 ?C) 98.2 ?F (36.8 ?C) 98.2 ?F (36.8 ?C) 98.1 ?F (36.7 ?C)  ?TempSrc: Oral Oral Oral Oral  ?SpO2: 97% 99% 97% 97%  ? ?CBC:  ?Recent Labs  ?Lab 11/25/21 ?1619 11/26/21 ?6759  ?WBC 8.3 9.2  ?NEUTROABS 5.3 5.9  ?HGB 12.0* 13.0  ?HCT 36.7* 40.3  ?MCV 89.1 88.8  ?PLT 368 358  ? ? ?Basic Metabolic Panel:  ?Recent Labs  ?Lab 11/25/21 ?1619 11/26/21 ?1638  ?NA 136 137  ?K 4.1 3.7  ?CL 105 102  ?CO2 24 25  ?GLUCOSE 99 92  ?BUN 20 12  ?CREATININE 1.01 0.77  ?CALCIUM 9.3 9.4  ?MG  --  1.9  ? ? ?Lipid Panel:  ?Recent Labs  ?Lab 11/26/21 ?4665  ?CHOL 188  ?TRIG 78  ?HDL 64  ?CHOLHDL 2.9  ?VLDL 16  ?[place] 108*  ? ? ?HgbA1c:  ?Recent Labs  ?Lab 11/26/21 ?9935  ?HGBA1C 5.5  ? ? ?Urine Drug Screen: No results for input(s): LABOPIA, COCAINSCRNUR, LABBENZ, AMPHETMU, THCU, LABBARB in the last 168 hours.  ?Alcohol Level No results for input(s): ETH in the last 168 hours. ? ?IMAGING past 24 hours ?No results found. ? ?PHYSICAL EXAM ?General:  Alert, well-developed, well-nourished elderly Caucasian male patient in no acute distress ?Respiratory: Regular, unlabored respirations on room air ? ?NEURO:  ?Mental Status: AA&Ox3  ?Speech/Language: speech is without dysarthria or aphasia.  Fluency and comprehension intact. ? ?Cranial Nerves:  ?II: PERRL. Visual fields full.  ?III, IV, VI: Right sided INO present with only slight weak adduction of the right eye with few beats of end gaze nystagmus while looking to the left ?V: Sensation is intact to light touch  and symmetrical to face.  ?VII: Smile is symmetrical.   ?VIII: hearing intact to voice. ?IX, X: Phonation is normal.  ?XII: tongue is midline without fasciculations. ?Motor: 5/5 strength to all muscle groups tested.  ?Sensation- Intact to light touch bilaterally. ?Coordination: FTN intact bilaterally.No drift.  ?Gait- deferred ? ? ? ?ASSESSMENT/PLAN ?[name] is a 88 y.o. male with history of BPH, HTN, GERD, AAA rupture s/p repair presenting with new onset diplopia when looking to the left which has been present for two days.  MRI demonstrates small ischemic infarcts in right basal ganglia and dorsal pons.  He presented outside the window for TNK, and thrombectomy was not done as he had no LVO. Plan for a carotid duplex in 6 months per [facility] and follow up with [facility] outpatient. No surgery recommended by vascular surgery team given that carotid duplex shows 40-59% stenosis in Rt ICA. Continue mobilizing out of bed. Continue ASA '81mg'$  and Plavix '75mg'$  for 3 weeks and then ASA monotherapy.  ? ?Stroke: lacunar right basal ganglia and dorsal pontine infarcts likely due to small vessel disease but patient also has 75% right ICA stenosis ?CT head No acute abnormality. Small vessel disease. Atrophy. ?CTA head & neck 75% right ICA stenosis, 40-50% left ICA stenosis ?MRI  60m acute anterior right basal ganglia infarct and 418[place]ute ischemic dorsal pontine infarct, atrophy and small vessel disease ?Carotid duplex- Rt ICA 40-59% stenosis, Lt ICA 1-39% stenoisis ?2D Echo EF 5578%rade I diastolic dysfunction, right atrial size mild-mod dilated ?LDL 108 ?HgbA1c 5.5 ?VTE prophylaxis - lovenox ?No antithrombotic prior to admission, now on aspirin 81 mg daily and clopidogrel 75 mg daily, for 3 weeks then continue ASA indefinitely ?Therapy recommendations: CLR  ?disposition:  pending ? ?Hypertension ?Home meds:  amlodipine 5 mg daily, irbesartan 300 mg daily, metoprolol 25 mg daily ?Stable ?Permissive hypertension (OK if <  220/120) but gradually normalize in 5-7 days ?Long-term BP goal normotensive ? ?Hyperlipidemia ?Home meds:  atorvastatin 10 mg daily, resumed in hospital ?LDL 108, goal < 70 ?Increase atorvastatin to 40 mg daily ?Continue statin at discharge ? ? ?Other Stroke Risk Factors ?Advanced Age >/= 6578?Former Cigarette smoker ? ?Other Active Problems ?GERD ?Pantoprazole 40 mg daily ?Hypothyroidism ?Continue home synthroid ? ?Hospital day # 4 ? ?Patient seen and examined by NP/APP with MD. MD to update note as needed.  ? ?[place] MSN, AGACNP-BC ?[facility] ?See Amion for schedule and pager information ?11/29/2021 12:07 PM ? I have personally obtained history,examined this patient, reviewed notes, independently viewed imaging studies, participated in medical decision making and plan of care.ROS completed by me personally and pertinent positives fully documented  I have made any additions or clarifications directly to the above note. Agree with note above.  Continue aspirin and Plavix for 3 weeks followed by aspirin alone.  Aggressive risk factor modification.  Stroke team will sign off.  Kindly call for questions ? ?PrAntony ContrasMD ?Medical Director ?[facility] ?Pager: [phone]/31/2023 1:27 PM ? ? ?To contact Stroke Continuity provider, please refer to [email or web address] hours, contact General Neurology  ?

## 2021-11-29 NOTE — TOC Transition Note (Signed)
Transition of Care (TOC) - CM/SW Discharge Note ? ? ?Patient Details  ?Name: Danny Frederick ?MRN: [id number] ?Date of Birth: 08-17-1934 ? ?Transition of Care [facility]) CM/SW Contact:  ?Geralynn Ochs, LCSW ?Phone Number: ?11/29/2021, 2:46 PM ? ? ?Clinical Narrative:   CSW confirmed plan to DC back to [facility] today with hospice. CSW alerted [facility] about discharge today, and sent discharge information to [facility]. CSW confirmed with daughter, Danny Frederick, of plan to discharge and schedule transport. Transport arranged with [facility] for next available. ? ?Nurse to call report to [phone]. ? ? ? ?Final next level of care: Assisted Living ?Barriers to Discharge: Barriers Resolved ? ? ?Patient Goals and CMS Choice ?Patient states their goals for this hospitalization and ongoing recovery are:: to get back home ?CMS Medicare.gov Compare Post Acute Care list provided to:: Patient ?Choice offered to / list presented to : Patient, Adult Children, Spouse ? ?Discharge Placement ?  ?           ?Patient chooses bed at:  ([facility]) ?Patient to be transferred to facility by: [facility] ?Name of family member notified: Danny Frederick ?Patient and family notified of of transfer: 11/29/21 ? ?Discharge Plan and Services ?  ?  ?Post Acute Care Choice: Hospice          ?  ?  ?  ?  ?  ?  ?  ?  ?  ?  ? ?Social Determinants of Health (SDOH) Interventions ?  ? ? ?Readmission Risk Interventions ?   ? View : No data to display.  ?  ?  ?  ? ? ? ? ? ?

## 2021-11-29 NOTE — Telephone Encounter (Signed)
This is fine.  He will also need TOC phone call and f/u arranged (if not already done) ?

## 2021-11-29 NOTE — NC FL2 (Signed)
?[place] MEDICAID FL2 LEVEL OF CARE SCREENING TOOL  ?  ? ?IDENTIFICATION  ?Patient Name: ?Danny Frederick Birthdate: 1934-07-08 Sex: male Admission Date (Current Location): ?11/25/2021  ?[place] and [place] Number: ? [place] ?  Facility and Address:  ?[facility], [place] [address], [place], [place] [address] ?     Provider Number: ?[id number]  ?Attending Physician Name and Address:  ?Phillips Grout, MD ? Relative Name and Phone Number:  ?  ?   ?Current Level of Care: ?Hospital Recommended Level of Care: ?Assisted Living Facility Prior Approval Number: ?  ? ?Date Approved/Denied: ?  PASRR Number: ?  ? ?Discharge Plan: ?Other (Comment) (ALF) ?  ? ?Current Diagnoses: ?Patient Active Problem List  ? Diagnosis Date Noted  ? Stroke of right basal ganglia ([place]) 11/25/2021  ? DNR (do not resuscitate) 10/06/2019  ? Mixed hyperlipidemia 12/13/2018  ? Family hx-stroke 12/13/2018  ? ICH (intracerebral hemorrhage) (HCC) - L BG 12/11/2018  ? Lip laceration 12/07/2018  ? Impaired fasting glucose 12/06/2018  ? Rotator cuff tear arthropathy 05/04/2018  ? Spinal stenosis of lumbar region 09/08/2017  ? Atherosclerosis of aorta ([place]) 11/08/2015  ? Recurrent Clostridium difficile diarrhea   ? Bacteremia   ? S/P ascending aortic aneurysm repair 01/24/2014  ? Ruptured thoracic aortic aneurysm 01/23/2014  ? 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 Right ventricular dysfunction 01/23/2014  ? Solitary pulmonary nodule 12/08/2013  ? Chest pain 12/02/2013  ? Allergic conjunctivitis 10/20/2012  ? Syncope 01/22/2012  ? Hypothyroidism 01/22/2011  ? Essential hypertension, benign 01/22/2011  ? GERD without esophagitis 01/22/2011  ? Iron deficiency anemia 01/22/2011  ? IRON DEFICIENCY ANEMIA SECONDARY TO BLOOD LOSS 11/08/2008  ? ANEMIA 05/26/2008  ? Essential hypertension 03/02/2008  ? DYSPNEA 03/02/2008  ? ? ?Orientation RESPIRATION BLADDER Height & Weight   ?  ?Self, Situation, Time, Place ? Normal Continent  Weight:   ?Height:     ?BEHAVIORAL SYMPTOMS/MOOD NEUROLOGICAL BOWEL NUTRITION STATUS  ?    Continent Diet (regular)  ?AMBULATORY STATUS COMMUNICATION OF NEEDS Skin   ?Limited Assist Verbally Normal ?  ?  ?  ?    ?     ?     ? ? ?Personal Care Assistance Level of Assistance  ?Bathing, Feeding, Dressing Bathing Assistance: Limited assistance ?Feeding assistance: Limited assistance ?Dressing Assistance: Limited assistance ?   ? ?Functional Limitations Info  ?Sight, Hearing Sight Info: Impaired ?Hearing Info: Impaired ?   ? ? ?SPECIAL CARE FACTORS FREQUENCY  ?    ?  ?  ?  ?  ?  ?  ?   ? ? ?Contractures Contractures Info: Not present  ? ? ?Additional Factors Info  ?Code Status, Allergies, Psychotropic Code Status Info: DNR ?Allergies Info: NKA ?Psychotropic Info: Celexa '20mg'$  daily ?  ?  ?   ? ?Current Medications (11/29/2021):  This is the current hospital active medication list ?Current Facility-Administered Medications  ?Medication Dose Route Frequency Provider Last Rate Last Admin  ?  stroke: mapping our early stages of recovery book   Does not apply Once Shalhoub, Sherryll Burger, MD      ? acetaminophen (TYLENOL) tablet 650 mg  650 mg Oral Q6H PRN Vernelle Emerald, MD   650 mg at 11/28/21 2315  ? Or  ? acetaminophen (TYLENOL) suppository 650 mg  650 mg Rectal Q6H PRN Shalhoub, Sherryll Burger, MD      ? aspirin EC tablet 81 mg  81 mg Oral Daily Shalhoub, Sherryll Burger,  MD   81 mg at 11/29/21 1236  ? atorvastatin (LIPITOR) tablet 40 mg  40 mg Oral Daily de Yolanda Manges, Cortney E, NP   40 mg at 11/29/21 1236  ? citalopram (CELEXA) tablet 20 mg  20 mg Oral Daily Shalhoub, Sherryll Burger, MD   20 mg at 11/29/21 1236  ? clopidogrel (PLAVIX) tablet 75 mg  75 mg Oral Daily de Yolanda Manges, Cortney E, NP   75 mg at 11/29/21 1236  ? enoxaparin (LOVENOX) injection 40 mg  40 mg Subcutaneous Q24H Vernelle Emerald, MD   40 mg at 11/29/21 1236  ? hydrALAZINE (APRESOLINE) injection 10 mg  10 mg Intravenous Q6H PRN Shalhoub, Sherryll Burger, MD      ? irbesartan  (AVAPRO) tablet 300 mg  300 mg Oral Daily Shalhoub, Sherryll Burger, MD   300 mg at 11/29/21 1236  ? levothyroxine (SYNTHROID) tablet 50 mcg  50 mcg Oral Q0600 Vernelle Emerald, MD   50 mcg at 11/29/21 9924  ? ondansetron (ZOFRAN) tablet 4 mg  4 mg Oral Q6H PRN Shalhoub, Sherryll Burger, MD      ? Or  ? ondansetron (ZOFRAN) injection 4 mg  4 mg Intravenous Q6H PRN Shalhoub, Sherryll Burger, MD      ? pantoprazole (PROTONIX) EC tablet 40 mg  40 mg Oral Daily Shalhoub, Sherryll Burger, MD   40 mg at 11/29/21 1236  ? polyethylene glycol (MIRALAX / GLYCOLAX) packet 17 g  17 g Oral Daily PRN Shalhoub, Sherryll Burger, MD      ? polyvinyl alcohol (LIQUIFILM TEARS) 1.4 % ophthalmic solution 1 drop  1 drop Both Eyes PRN Rise Patience, MD   1 drop at 11/29/21 1241  ? ? ? ?Discharge Medications: ?TAKE these medications   ?  ?acetaminophen 500 MG tablet ?Commonly known as: TYLENOL ?Take 1,000 mg by mouth every 8 (eight) hours as needed for mild pain. ?   ?amLODipine 5 MG tablet ?Commonly known as: NORVASC ?TAKE 1 TABLET BY MOUTH  TWICE DAILY ?   ?aspirin 81 MG EC tablet ?Take 1 tablet (81 mg total) by mouth daily. Swallow whole. ?   ?atorvastatin 40 MG tablet ?Commonly known as: LIPITOR ?Take 1 tablet (40 mg total) by mouth daily. ?What changed:  ?medication strength ?how much to take ?   ?citalopram 20 MG tablet ?Commonly known as: CELEXA ?Take 1 tablet (20 mg total) by mouth daily. ?What changed: how much to take ?   ?clopidogrel 75 MG tablet ?Commonly known as: PLAVIX ?Take 1 tablet (75 mg total) by mouth daily. ?   ?fexofenadine 180 MG tablet ?Commonly known as: ALLEGRA ?Take 180 mg by mouth daily as needed for allergies. ?   ?irbesartan 300 MG tablet ?Commonly known as: AVAPRO ?TAKE 1 TABLET BY MOUTH  DAILY ?   ?Iron 325 (65 Fe) MG Tabs ?Take 1 tablet (325 mg total) by mouth every other day. Reported on 11/08/2015 ?   ?loperamide 2 MG tablet ?Commonly known as: IMODIUM A-D ?Take 2 mg by mouth 3 (three) times daily as needed for diarrhea or loose  stools. ?   ?memantine 10 MG tablet ?Commonly known as: NAMENDA ?Take 10 mg by mouth 2 (two) times daily. ?   ?metoprolol tartrate 25 MG tablet ?Commonly known as: LOPRESSOR ?TAKE 1 TABLET BY MOUTH  TWICE DAILY ?   ?omeprazole 20 MG capsule ?Commonly known as: PRILOSEC ?TAKE 1 CAPSULE BY MOUTH  EVERY OTHER DAY ?What changed:  ?how much to take ?  how to take this ?when to take this ?additional instructions ?   ?Synthroid 50 MCG tablet ?Generic drug: levothyroxine ?TAKE 1 TABLET BY MOUTH  DAILY BEFORE BREAKFAST ?What changed:  ?how much to take ?when to take this ?   ?traMADol 50 MG tablet ?Commonly known as: ULTRAM ?Take 25 mg by mouth 2 (two) times daily. ?   ?Vitamin D3 50 MCG (2000 UT) Tabs ?Take 2,000 Units by mouth daily.  ? ? ?Relevant Imaging Results: ? ?Relevant Lab Results: ? ? ?Additional Information ?SS#: [id number] ? ?Geralynn Ochs, LCSW ? ? ? ? ?

## 2021-11-29 NOTE — Telephone Encounter (Signed)
Jasmine from [facility] is calling stating that the family is requesting that you be the attending Dr for pt, he is been D/C today, Delana Meyer can be reached at [phone] ? ?Please send back to someone else as I leave at 12.30 ?Also informed them that you was not in the office on fridays  ?

## 2021-11-30 DIAGNOSIS — E785 Hyperlipidemia, unspecified: Secondary | ICD-10-CM | POA: Diagnosis not present

## 2021-11-30 DIAGNOSIS — N400 Enlarged prostate without lower urinary tract symptoms: Secondary | ICD-10-CM | POA: Diagnosis not present

## 2021-11-30 DIAGNOSIS — E039 Hypothyroidism, unspecified: Secondary | ICD-10-CM | POA: Diagnosis not present

## 2021-11-30 DIAGNOSIS — I6529 Occlusion and stenosis of unspecified carotid artery: Secondary | ICD-10-CM | POA: Diagnosis not present

## 2021-11-30 DIAGNOSIS — I5030 Unspecified diastolic (congestive) heart failure: Secondary | ICD-10-CM | POA: Diagnosis not present

## 2021-11-30 DIAGNOSIS — I69318 Other symptoms and signs involving cognitive functions following cerebral infarction: Secondary | ICD-10-CM | POA: Diagnosis not present

## 2021-11-30 DIAGNOSIS — Z87891 Personal history of nicotine dependence: Secondary | ICD-10-CM | POA: Diagnosis not present

## 2021-11-30 DIAGNOSIS — I110 Hypertensive heart disease with heart failure: Secondary | ICD-10-CM | POA: Diagnosis not present

## 2021-11-30 DIAGNOSIS — R630 Anorexia: Secondary | ICD-10-CM | POA: Diagnosis not present

## 2021-11-30 DIAGNOSIS — J302 Other seasonal allergic rhinitis: Secondary | ICD-10-CM | POA: Diagnosis not present

## 2021-11-30 DIAGNOSIS — H04129 Dry eye syndrome of unspecified lacrimal gland: Secondary | ICD-10-CM | POA: Diagnosis not present

## 2021-11-30 DIAGNOSIS — I714 Abdominal aortic aneurysm, without rupture, unspecified: Secondary | ICD-10-CM | POA: Diagnosis not present

## 2021-11-30 DIAGNOSIS — I672 Cerebral atherosclerosis: Secondary | ICD-10-CM | POA: Diagnosis not present

## 2021-12-01 DIAGNOSIS — I69318 Other symptoms and signs involving cognitive functions following cerebral infarction: Secondary | ICD-10-CM | POA: Diagnosis not present

## 2021-12-01 DIAGNOSIS — I6529 Occlusion and stenosis of unspecified carotid artery: Secondary | ICD-10-CM | POA: Diagnosis not present

## 2021-12-01 DIAGNOSIS — I5030 Unspecified diastolic (congestive) heart failure: Secondary | ICD-10-CM | POA: Diagnosis not present

## 2021-12-01 DIAGNOSIS — I672 Cerebral atherosclerosis: Secondary | ICD-10-CM | POA: Diagnosis not present

## 2021-12-01 DIAGNOSIS — I714 Abdominal aortic aneurysm, without rupture, unspecified: Secondary | ICD-10-CM | POA: Diagnosis not present

## 2021-12-01 DIAGNOSIS — I110 Hypertensive heart disease with heart failure: Secondary | ICD-10-CM | POA: Diagnosis not present

## 2021-12-02 ENCOUNTER — Telehealth: Payer: Self-pay | Admitting: *Deleted

## 2021-12-02 DIAGNOSIS — I672 Cerebral atherosclerosis: Secondary | ICD-10-CM | POA: Diagnosis not present

## 2021-12-02 DIAGNOSIS — I714 Abdominal aortic aneurysm, without rupture, unspecified: Secondary | ICD-10-CM | POA: Diagnosis not present

## 2021-12-02 DIAGNOSIS — I5030 Unspecified diastolic (congestive) heart failure: Secondary | ICD-10-CM | POA: Diagnosis not present

## 2021-12-02 DIAGNOSIS — I110 Hypertensive heart disease with heart failure: Secondary | ICD-10-CM | POA: Diagnosis not present

## 2021-12-02 DIAGNOSIS — I6529 Occlusion and stenosis of unspecified carotid artery: Secondary | ICD-10-CM | POA: Diagnosis not present

## 2021-12-02 DIAGNOSIS — I69318 Other symptoms and signs involving cognitive functions following cerebral infarction: Secondary | ICD-10-CM | POA: Diagnosis not present

## 2021-12-02 NOTE — Chronic Care Management (AMB) (Signed)
?  Care Management  ? ?Outreach Note ? ?12/02/2021 ?Name: Danny Frederick MRN: [id number] DOB: April 02, 1934 ? ?Referred by: Rita Ohara, MD ?Reason for referral : Care Coordination (Initial outreach to schedule with BSW ) ? ? ?An unsuccessful telephone outreach was attempted today. The patient was referred to the case management team for assistance with care management and care coordination.  ? ?Follow Up Plan:  ?A HIPAA compliant phone message was left for the patient providing contact information and requesting a return call.  ?The care management team will reach out to the patient again over the next 7 days.  ?If patient returns call to provider office, please advise to call [facility]* at [phone].* ? ? ?Laverda Sorenson  ?Care Guide, Embedded Care Coordination ?[facility] [facility]  Care Management  ?Direct Dial: [phone] ? ?

## 2021-12-03 DIAGNOSIS — I672 Cerebral atherosclerosis: Secondary | ICD-10-CM | POA: Diagnosis not present

## 2021-12-03 DIAGNOSIS — I69318 Other symptoms and signs involving cognitive functions following cerebral infarction: Secondary | ICD-10-CM | POA: Diagnosis not present

## 2021-12-03 DIAGNOSIS — I110 Hypertensive heart disease with heart failure: Secondary | ICD-10-CM | POA: Diagnosis not present

## 2021-12-03 DIAGNOSIS — I6529 Occlusion and stenosis of unspecified carotid artery: Secondary | ICD-10-CM | POA: Diagnosis not present

## 2021-12-03 DIAGNOSIS — I5030 Unspecified diastolic (congestive) heart failure: Secondary | ICD-10-CM | POA: Diagnosis not present

## 2021-12-03 DIAGNOSIS — I714 Abdominal aortic aneurysm, without rupture, unspecified: Secondary | ICD-10-CM | POA: Diagnosis not present

## 2021-12-04 NOTE — Chronic Care Management (AMB) (Signed)
?  Care Management  ? ?Note ? ?12/04/2021 ?Name: [name] MRN: [id number] DOB: [birth year] ? ?Danny Frederick is a 88 y.o. year old male who is a primary care patient of Rita Ohara, MD. I reached out to [employer] by phone today offer care coordination services.  ? ?Mr. Safran was given information about care management services today including:  ?Care management services include personalized support from designated clinical staff supervised by his physician, including individualized plan of care and coordination with other care providers ?24/7 contact phone numbers for assistance for urgent and routine care needs. ?The patient may stop care management services at any time by phone call to the office staff. ? ?Patient did not agree to enrollment in care management services and does not wish to consider at this time. ? ?Follow up plan: ?Patient declines further follow up and engagement by the care management team. Appropriate care team members and provider have been notified via electronic communication. The care management team is available to follow up with the patient after provider conversation with the patient regarding recommendation for care management engagement and subsequent re-referral to the care management team.  ? ?Laverda Sorenson  ?Care Guide, Embedded Care Coordination ?[place]  Care Management  ?Direct Dial: [phone] ? ?

## 2021-12-06 DIAGNOSIS — E785 Hyperlipidemia, unspecified: Secondary | ICD-10-CM | POA: Diagnosis not present

## 2021-12-06 DIAGNOSIS — D649 Anemia, unspecified: Secondary | ICD-10-CM | POA: Diagnosis not present

## 2021-12-06 DIAGNOSIS — E039 Hypothyroidism, unspecified: Secondary | ICD-10-CM | POA: Diagnosis not present

## 2021-12-06 DIAGNOSIS — E559 Vitamin D deficiency, unspecified: Secondary | ICD-10-CM | POA: Diagnosis not present

## 2021-12-06 DIAGNOSIS — Z8673 Personal history of transient ischemic attack (TIA), and cerebral infarction without residual deficits: Secondary | ICD-10-CM | POA: Diagnosis not present

## 2021-12-06 DIAGNOSIS — F32A Depression, unspecified: Secondary | ICD-10-CM | POA: Diagnosis not present

## 2021-12-06 DIAGNOSIS — I10 Essential (primary) hypertension: Secondary | ICD-10-CM | POA: Diagnosis not present

## 2021-12-07 DIAGNOSIS — I5030 Unspecified diastolic (congestive) heart failure: Secondary | ICD-10-CM | POA: Diagnosis not present

## 2021-12-07 DIAGNOSIS — I110 Hypertensive heart disease with heart failure: Secondary | ICD-10-CM | POA: Diagnosis not present

## 2021-12-07 DIAGNOSIS — I714 Abdominal aortic aneurysm, without rupture, unspecified: Secondary | ICD-10-CM | POA: Diagnosis not present

## 2021-12-07 DIAGNOSIS — I672 Cerebral atherosclerosis: Secondary | ICD-10-CM | POA: Diagnosis not present

## 2021-12-07 DIAGNOSIS — I69318 Other symptoms and signs involving cognitive functions following cerebral infarction: Secondary | ICD-10-CM | POA: Diagnosis not present

## 2021-12-07 DIAGNOSIS — I6529 Occlusion and stenosis of unspecified carotid artery: Secondary | ICD-10-CM | POA: Diagnosis not present

## 2021-12-09 DIAGNOSIS — I714 Abdominal aortic aneurysm, without rupture, unspecified: Secondary | ICD-10-CM | POA: Diagnosis not present

## 2021-12-09 DIAGNOSIS — I110 Hypertensive heart disease with heart failure: Secondary | ICD-10-CM | POA: Diagnosis not present

## 2021-12-09 DIAGNOSIS — I6529 Occlusion and stenosis of unspecified carotid artery: Secondary | ICD-10-CM | POA: Diagnosis not present

## 2021-12-09 DIAGNOSIS — I5030 Unspecified diastolic (congestive) heart failure: Secondary | ICD-10-CM | POA: Diagnosis not present

## 2021-12-09 DIAGNOSIS — I69318 Other symptoms and signs involving cognitive functions following cerebral infarction: Secondary | ICD-10-CM | POA: Diagnosis not present

## 2021-12-09 DIAGNOSIS — I672 Cerebral atherosclerosis: Secondary | ICD-10-CM | POA: Diagnosis not present

## 2021-12-17 DIAGNOSIS — I69318 Other symptoms and signs involving cognitive functions following cerebral infarction: Secondary | ICD-10-CM | POA: Diagnosis not present

## 2021-12-17 DIAGNOSIS — I714 Abdominal aortic aneurysm, without rupture, unspecified: Secondary | ICD-10-CM | POA: Diagnosis not present

## 2021-12-17 DIAGNOSIS — I5030 Unspecified diastolic (congestive) heart failure: Secondary | ICD-10-CM | POA: Diagnosis not present

## 2021-12-17 DIAGNOSIS — I6529 Occlusion and stenosis of unspecified carotid artery: Secondary | ICD-10-CM | POA: Diagnosis not present

## 2021-12-17 DIAGNOSIS — I672 Cerebral atherosclerosis: Secondary | ICD-10-CM | POA: Diagnosis not present

## 2021-12-17 DIAGNOSIS — I110 Hypertensive heart disease with heart failure: Secondary | ICD-10-CM | POA: Diagnosis not present

## 2021-12-19 DIAGNOSIS — E785 Hyperlipidemia, unspecified: Secondary | ICD-10-CM | POA: Diagnosis not present

## 2021-12-19 DIAGNOSIS — I110 Hypertensive heart disease with heart failure: Secondary | ICD-10-CM | POA: Diagnosis not present

## 2021-12-19 DIAGNOSIS — E559 Vitamin D deficiency, unspecified: Secondary | ICD-10-CM | POA: Diagnosis not present

## 2021-12-19 DIAGNOSIS — E039 Hypothyroidism, unspecified: Secondary | ICD-10-CM | POA: Diagnosis not present

## 2021-12-19 DIAGNOSIS — I6529 Occlusion and stenosis of unspecified carotid artery: Secondary | ICD-10-CM | POA: Diagnosis not present

## 2021-12-19 DIAGNOSIS — I672 Cerebral atherosclerosis: Secondary | ICD-10-CM | POA: Diagnosis not present

## 2021-12-19 DIAGNOSIS — I5030 Unspecified diastolic (congestive) heart failure: Secondary | ICD-10-CM | POA: Diagnosis not present

## 2021-12-19 DIAGNOSIS — I714 Abdominal aortic aneurysm, without rupture, unspecified: Secondary | ICD-10-CM | POA: Diagnosis not present

## 2021-12-19 DIAGNOSIS — I69318 Other symptoms and signs involving cognitive functions following cerebral infarction: Secondary | ICD-10-CM | POA: Diagnosis not present

## 2021-12-20 ENCOUNTER — Telehealth: Payer: Self-pay

## 2021-12-20 NOTE — Telephone Encounter (Signed)
Williams RN with [facility] and Palliative Care called wanted to know if you would be the attending physician for the pts. Palliative care or would the doctor at the facility he is at would be the attending physician.  ?

## 2021-12-23 DIAGNOSIS — I69318 Other symptoms and signs involving cognitive functions following cerebral infarction: Secondary | ICD-10-CM | POA: Diagnosis not present

## 2021-12-23 DIAGNOSIS — I110 Hypertensive heart disease with heart failure: Secondary | ICD-10-CM | POA: Diagnosis not present

## 2021-12-23 DIAGNOSIS — I5030 Unspecified diastolic (congestive) heart failure: Secondary | ICD-10-CM | POA: Diagnosis not present

## 2021-12-23 DIAGNOSIS — I6529 Occlusion and stenosis of unspecified carotid artery: Secondary | ICD-10-CM | POA: Diagnosis not present

## 2021-12-23 DIAGNOSIS — I714 Abdominal aortic aneurysm, without rupture, unspecified: Secondary | ICD-10-CM | POA: Diagnosis not present

## 2021-12-23 DIAGNOSIS — I672 Cerebral atherosclerosis: Secondary | ICD-10-CM | POA: Diagnosis not present

## 2021-12-24 ENCOUNTER — Ambulatory Visit: Payer: Medicare Other | Admitting: Family Medicine

## 2021-12-25 DIAGNOSIS — I110 Hypertensive heart disease with heart failure: Secondary | ICD-10-CM | POA: Diagnosis not present

## 2021-12-25 DIAGNOSIS — I69318 Other symptoms and signs involving cognitive functions following cerebral infarction: Secondary | ICD-10-CM | POA: Diagnosis not present

## 2021-12-25 DIAGNOSIS — I6529 Occlusion and stenosis of unspecified carotid artery: Secondary | ICD-10-CM | POA: Diagnosis not present

## 2021-12-25 DIAGNOSIS — I5030 Unspecified diastolic (congestive) heart failure: Secondary | ICD-10-CM | POA: Diagnosis not present

## 2021-12-25 DIAGNOSIS — I672 Cerebral atherosclerosis: Secondary | ICD-10-CM | POA: Diagnosis not present

## 2021-12-25 DIAGNOSIS — I714 Abdominal aortic aneurysm, without rupture, unspecified: Secondary | ICD-10-CM | POA: Diagnosis not present

## 2021-12-26 DIAGNOSIS — I110 Hypertensive heart disease with heart failure: Secondary | ICD-10-CM | POA: Diagnosis not present

## 2021-12-26 DIAGNOSIS — I672 Cerebral atherosclerosis: Secondary | ICD-10-CM | POA: Diagnosis not present

## 2021-12-26 DIAGNOSIS — I5030 Unspecified diastolic (congestive) heart failure: Secondary | ICD-10-CM | POA: Diagnosis not present

## 2021-12-26 DIAGNOSIS — I6529 Occlusion and stenosis of unspecified carotid artery: Secondary | ICD-10-CM | POA: Diagnosis not present

## 2021-12-26 DIAGNOSIS — I69318 Other symptoms and signs involving cognitive functions following cerebral infarction: Secondary | ICD-10-CM | POA: Diagnosis not present

## 2021-12-26 DIAGNOSIS — I714 Abdominal aortic aneurysm, without rupture, unspecified: Secondary | ICD-10-CM | POA: Diagnosis not present

## 2021-12-30 DIAGNOSIS — I5030 Unspecified diastolic (congestive) heart failure: Secondary | ICD-10-CM | POA: Diagnosis not present

## 2021-12-30 DIAGNOSIS — Z87891 Personal history of nicotine dependence: Secondary | ICD-10-CM | POA: Diagnosis not present

## 2021-12-30 DIAGNOSIS — I672 Cerebral atherosclerosis: Secondary | ICD-10-CM | POA: Diagnosis not present

## 2021-12-30 DIAGNOSIS — H04129 Dry eye syndrome of unspecified lacrimal gland: Secondary | ICD-10-CM | POA: Diagnosis not present

## 2021-12-30 DIAGNOSIS — J302 Other seasonal allergic rhinitis: Secondary | ICD-10-CM | POA: Diagnosis not present

## 2021-12-30 DIAGNOSIS — L853 Xerosis cutis: Secondary | ICD-10-CM | POA: Diagnosis not present

## 2021-12-30 DIAGNOSIS — I69318 Other symptoms and signs involving cognitive functions following cerebral infarction: Secondary | ICD-10-CM | POA: Diagnosis not present

## 2021-12-30 DIAGNOSIS — E039 Hypothyroidism, unspecified: Secondary | ICD-10-CM | POA: Diagnosis not present

## 2021-12-30 DIAGNOSIS — N400 Enlarged prostate without lower urinary tract symptoms: Secondary | ICD-10-CM | POA: Diagnosis not present

## 2021-12-30 DIAGNOSIS — E785 Hyperlipidemia, unspecified: Secondary | ICD-10-CM | POA: Diagnosis not present

## 2021-12-30 DIAGNOSIS — I6529 Occlusion and stenosis of unspecified carotid artery: Secondary | ICD-10-CM | POA: Diagnosis not present

## 2021-12-30 DIAGNOSIS — I714 Abdominal aortic aneurysm, without rupture, unspecified: Secondary | ICD-10-CM | POA: Diagnosis not present

## 2021-12-30 DIAGNOSIS — I110 Hypertensive heart disease with heart failure: Secondary | ICD-10-CM | POA: Diagnosis not present

## 2021-12-30 DIAGNOSIS — R630 Anorexia: Secondary | ICD-10-CM | POA: Diagnosis not present

## 2022-01-01 DIAGNOSIS — I69318 Other symptoms and signs involving cognitive functions following cerebral infarction: Secondary | ICD-10-CM | POA: Diagnosis not present

## 2022-01-01 DIAGNOSIS — I672 Cerebral atherosclerosis: Secondary | ICD-10-CM | POA: Diagnosis not present

## 2022-01-01 DIAGNOSIS — I6529 Occlusion and stenosis of unspecified carotid artery: Secondary | ICD-10-CM | POA: Diagnosis not present

## 2022-01-01 DIAGNOSIS — I5030 Unspecified diastolic (congestive) heart failure: Secondary | ICD-10-CM | POA: Diagnosis not present

## 2022-01-01 DIAGNOSIS — I714 Abdominal aortic aneurysm, without rupture, unspecified: Secondary | ICD-10-CM | POA: Diagnosis not present

## 2022-01-01 DIAGNOSIS — I110 Hypertensive heart disease with heart failure: Secondary | ICD-10-CM | POA: Diagnosis not present

## 2022-01-02 DIAGNOSIS — I5030 Unspecified diastolic (congestive) heart failure: Secondary | ICD-10-CM | POA: Diagnosis not present

## 2022-01-02 DIAGNOSIS — I672 Cerebral atherosclerosis: Secondary | ICD-10-CM | POA: Diagnosis not present

## 2022-01-02 DIAGNOSIS — I110 Hypertensive heart disease with heart failure: Secondary | ICD-10-CM | POA: Diagnosis not present

## 2022-01-02 DIAGNOSIS — I714 Abdominal aortic aneurysm, without rupture, unspecified: Secondary | ICD-10-CM | POA: Diagnosis not present

## 2022-01-02 DIAGNOSIS — I6529 Occlusion and stenosis of unspecified carotid artery: Secondary | ICD-10-CM | POA: Diagnosis not present

## 2022-01-02 DIAGNOSIS — I69318 Other symptoms and signs involving cognitive functions following cerebral infarction: Secondary | ICD-10-CM | POA: Diagnosis not present

## 2022-01-07 DIAGNOSIS — I69318 Other symptoms and signs involving cognitive functions following cerebral infarction: Secondary | ICD-10-CM | POA: Diagnosis not present

## 2022-01-07 DIAGNOSIS — I5030 Unspecified diastolic (congestive) heart failure: Secondary | ICD-10-CM | POA: Diagnosis not present

## 2022-01-07 DIAGNOSIS — I714 Abdominal aortic aneurysm, without rupture, unspecified: Secondary | ICD-10-CM | POA: Diagnosis not present

## 2022-01-07 DIAGNOSIS — I110 Hypertensive heart disease with heart failure: Secondary | ICD-10-CM | POA: Diagnosis not present

## 2022-01-07 DIAGNOSIS — I6529 Occlusion and stenosis of unspecified carotid artery: Secondary | ICD-10-CM | POA: Diagnosis not present

## 2022-01-07 DIAGNOSIS — I672 Cerebral atherosclerosis: Secondary | ICD-10-CM | POA: Diagnosis not present

## 2022-01-08 DIAGNOSIS — I110 Hypertensive heart disease with heart failure: Secondary | ICD-10-CM | POA: Diagnosis not present

## 2022-01-08 DIAGNOSIS — I5030 Unspecified diastolic (congestive) heart failure: Secondary | ICD-10-CM | POA: Diagnosis not present

## 2022-01-08 DIAGNOSIS — I6529 Occlusion and stenosis of unspecified carotid artery: Secondary | ICD-10-CM | POA: Diagnosis not present

## 2022-01-08 DIAGNOSIS — I69318 Other symptoms and signs involving cognitive functions following cerebral infarction: Secondary | ICD-10-CM | POA: Diagnosis not present

## 2022-01-08 DIAGNOSIS — I672 Cerebral atherosclerosis: Secondary | ICD-10-CM | POA: Diagnosis not present

## 2022-01-08 DIAGNOSIS — I714 Abdominal aortic aneurysm, without rupture, unspecified: Secondary | ICD-10-CM | POA: Diagnosis not present

## 2022-01-09 DIAGNOSIS — I5030 Unspecified diastolic (congestive) heart failure: Secondary | ICD-10-CM | POA: Diagnosis not present

## 2022-01-09 DIAGNOSIS — I672 Cerebral atherosclerosis: Secondary | ICD-10-CM | POA: Diagnosis not present

## 2022-01-09 DIAGNOSIS — I714 Abdominal aortic aneurysm, without rupture, unspecified: Secondary | ICD-10-CM | POA: Diagnosis not present

## 2022-01-09 DIAGNOSIS — I69318 Other symptoms and signs involving cognitive functions following cerebral infarction: Secondary | ICD-10-CM | POA: Diagnosis not present

## 2022-01-09 DIAGNOSIS — I110 Hypertensive heart disease with heart failure: Secondary | ICD-10-CM | POA: Diagnosis not present

## 2022-01-09 DIAGNOSIS — I6529 Occlusion and stenosis of unspecified carotid artery: Secondary | ICD-10-CM | POA: Diagnosis not present

## 2022-01-13 ENCOUNTER — Ambulatory Visit: Payer: Medicare Other | Admitting: Family Medicine

## 2022-01-14 DIAGNOSIS — I714 Abdominal aortic aneurysm, without rupture, unspecified: Secondary | ICD-10-CM | POA: Diagnosis not present

## 2022-01-14 DIAGNOSIS — I110 Hypertensive heart disease with heart failure: Secondary | ICD-10-CM | POA: Diagnosis not present

## 2022-01-14 DIAGNOSIS — I69318 Other symptoms and signs involving cognitive functions following cerebral infarction: Secondary | ICD-10-CM | POA: Diagnosis not present

## 2022-01-14 DIAGNOSIS — I672 Cerebral atherosclerosis: Secondary | ICD-10-CM | POA: Diagnosis not present

## 2022-01-14 DIAGNOSIS — I6529 Occlusion and stenosis of unspecified carotid artery: Secondary | ICD-10-CM | POA: Diagnosis not present

## 2022-01-14 DIAGNOSIS — I5030 Unspecified diastolic (congestive) heart failure: Secondary | ICD-10-CM | POA: Diagnosis not present

## 2022-01-16 DIAGNOSIS — I672 Cerebral atherosclerosis: Secondary | ICD-10-CM | POA: Diagnosis not present

## 2022-01-16 DIAGNOSIS — I714 Abdominal aortic aneurysm, without rupture, unspecified: Secondary | ICD-10-CM | POA: Diagnosis not present

## 2022-01-16 DIAGNOSIS — I6529 Occlusion and stenosis of unspecified carotid artery: Secondary | ICD-10-CM | POA: Diagnosis not present

## 2022-01-16 DIAGNOSIS — I5030 Unspecified diastolic (congestive) heart failure: Secondary | ICD-10-CM | POA: Diagnosis not present

## 2022-01-16 DIAGNOSIS — I110 Hypertensive heart disease with heart failure: Secondary | ICD-10-CM | POA: Diagnosis not present

## 2022-01-16 DIAGNOSIS — I69318 Other symptoms and signs involving cognitive functions following cerebral infarction: Secondary | ICD-10-CM | POA: Diagnosis not present

## 2022-01-21 DIAGNOSIS — I69318 Other symptoms and signs involving cognitive functions following cerebral infarction: Secondary | ICD-10-CM | POA: Diagnosis not present

## 2022-01-21 DIAGNOSIS — I110 Hypertensive heart disease with heart failure: Secondary | ICD-10-CM | POA: Diagnosis not present

## 2022-01-21 DIAGNOSIS — I672 Cerebral atherosclerosis: Secondary | ICD-10-CM | POA: Diagnosis not present

## 2022-01-21 DIAGNOSIS — I714 Abdominal aortic aneurysm, without rupture, unspecified: Secondary | ICD-10-CM | POA: Diagnosis not present

## 2022-01-21 DIAGNOSIS — I5030 Unspecified diastolic (congestive) heart failure: Secondary | ICD-10-CM | POA: Diagnosis not present

## 2022-01-21 DIAGNOSIS — I6529 Occlusion and stenosis of unspecified carotid artery: Secondary | ICD-10-CM | POA: Diagnosis not present

## 2022-01-23 DIAGNOSIS — I110 Hypertensive heart disease with heart failure: Secondary | ICD-10-CM | POA: Diagnosis not present

## 2022-01-23 DIAGNOSIS — I672 Cerebral atherosclerosis: Secondary | ICD-10-CM | POA: Diagnosis not present

## 2022-01-23 DIAGNOSIS — I69318 Other symptoms and signs involving cognitive functions following cerebral infarction: Secondary | ICD-10-CM | POA: Diagnosis not present

## 2022-01-23 DIAGNOSIS — I5030 Unspecified diastolic (congestive) heart failure: Secondary | ICD-10-CM | POA: Diagnosis not present

## 2022-01-23 DIAGNOSIS — I6529 Occlusion and stenosis of unspecified carotid artery: Secondary | ICD-10-CM | POA: Diagnosis not present

## 2022-01-23 DIAGNOSIS — I714 Abdominal aortic aneurysm, without rupture, unspecified: Secondary | ICD-10-CM | POA: Diagnosis not present

## 2022-01-24 DIAGNOSIS — R0989 Other specified symptoms and signs involving the circulatory and respiratory systems: Secondary | ICD-10-CM | POA: Diagnosis not present

## 2022-01-24 DIAGNOSIS — I679 Cerebrovascular disease, unspecified: Secondary | ICD-10-CM | POA: Diagnosis not present

## 2022-01-24 DIAGNOSIS — I779 Disorder of arteries and arterioles, unspecified: Secondary | ICD-10-CM | POA: Diagnosis not present

## 2022-01-24 DIAGNOSIS — Z789 Other specified health status: Secondary | ICD-10-CM | POA: Diagnosis not present

## 2022-01-24 DIAGNOSIS — R5381 Other malaise: Secondary | ICD-10-CM | POA: Diagnosis not present

## 2022-01-28 DIAGNOSIS — I6529 Occlusion and stenosis of unspecified carotid artery: Secondary | ICD-10-CM | POA: Diagnosis not present

## 2022-01-28 DIAGNOSIS — I672 Cerebral atherosclerosis: Secondary | ICD-10-CM | POA: Diagnosis not present

## 2022-01-28 DIAGNOSIS — I5030 Unspecified diastolic (congestive) heart failure: Secondary | ICD-10-CM | POA: Diagnosis not present

## 2022-01-28 DIAGNOSIS — I110 Hypertensive heart disease with heart failure: Secondary | ICD-10-CM | POA: Diagnosis not present

## 2022-01-28 DIAGNOSIS — I714 Abdominal aortic aneurysm, without rupture, unspecified: Secondary | ICD-10-CM | POA: Diagnosis not present

## 2022-01-28 DIAGNOSIS — I69318 Other symptoms and signs involving cognitive functions following cerebral infarction: Secondary | ICD-10-CM | POA: Diagnosis not present

## 2022-01-30 DIAGNOSIS — R630 Anorexia: Secondary | ICD-10-CM | POA: Diagnosis not present

## 2022-01-30 DIAGNOSIS — I110 Hypertensive heart disease with heart failure: Secondary | ICD-10-CM | POA: Diagnosis not present

## 2022-01-30 DIAGNOSIS — J302 Other seasonal allergic rhinitis: Secondary | ICD-10-CM | POA: Diagnosis not present

## 2022-01-30 DIAGNOSIS — I714 Abdominal aortic aneurysm, without rupture, unspecified: Secondary | ICD-10-CM | POA: Diagnosis not present

## 2022-01-30 DIAGNOSIS — Z87891 Personal history of nicotine dependence: Secondary | ICD-10-CM | POA: Diagnosis not present

## 2022-01-30 DIAGNOSIS — I6529 Occlusion and stenosis of unspecified carotid artery: Secondary | ICD-10-CM | POA: Diagnosis not present

## 2022-01-30 DIAGNOSIS — E785 Hyperlipidemia, unspecified: Secondary | ICD-10-CM | POA: Diagnosis not present

## 2022-01-30 DIAGNOSIS — L853 Xerosis cutis: Secondary | ICD-10-CM | POA: Diagnosis not present

## 2022-01-30 DIAGNOSIS — I672 Cerebral atherosclerosis: Secondary | ICD-10-CM | POA: Diagnosis not present

## 2022-01-30 DIAGNOSIS — I69318 Other symptoms and signs involving cognitive functions following cerebral infarction: Secondary | ICD-10-CM | POA: Diagnosis not present

## 2022-01-30 DIAGNOSIS — E039 Hypothyroidism, unspecified: Secondary | ICD-10-CM | POA: Diagnosis not present

## 2022-01-30 DIAGNOSIS — I5030 Unspecified diastolic (congestive) heart failure: Secondary | ICD-10-CM | POA: Diagnosis not present

## 2022-01-30 DIAGNOSIS — H04129 Dry eye syndrome of unspecified lacrimal gland: Secondary | ICD-10-CM | POA: Diagnosis not present

## 2022-01-30 DIAGNOSIS — N400 Enlarged prostate without lower urinary tract symptoms: Secondary | ICD-10-CM | POA: Diagnosis not present

## 2022-01-31 DIAGNOSIS — I110 Hypertensive heart disease with heart failure: Secondary | ICD-10-CM | POA: Diagnosis not present

## 2022-01-31 DIAGNOSIS — I6529 Occlusion and stenosis of unspecified carotid artery: Secondary | ICD-10-CM | POA: Diagnosis not present

## 2022-01-31 DIAGNOSIS — I5030 Unspecified diastolic (congestive) heart failure: Secondary | ICD-10-CM | POA: Diagnosis not present

## 2022-01-31 DIAGNOSIS — I714 Abdominal aortic aneurysm, without rupture, unspecified: Secondary | ICD-10-CM | POA: Diagnosis not present

## 2022-01-31 DIAGNOSIS — I69318 Other symptoms and signs involving cognitive functions following cerebral infarction: Secondary | ICD-10-CM | POA: Diagnosis not present

## 2022-01-31 DIAGNOSIS — I672 Cerebral atherosclerosis: Secondary | ICD-10-CM | POA: Diagnosis not present

## 2022-02-04 DIAGNOSIS — I5030 Unspecified diastolic (congestive) heart failure: Secondary | ICD-10-CM | POA: Diagnosis not present

## 2022-02-04 DIAGNOSIS — I69318 Other symptoms and signs involving cognitive functions following cerebral infarction: Secondary | ICD-10-CM | POA: Diagnosis not present

## 2022-02-04 DIAGNOSIS — I110 Hypertensive heart disease with heart failure: Secondary | ICD-10-CM | POA: Diagnosis not present

## 2022-02-04 DIAGNOSIS — I714 Abdominal aortic aneurysm, without rupture, unspecified: Secondary | ICD-10-CM | POA: Diagnosis not present

## 2022-02-04 DIAGNOSIS — I672 Cerebral atherosclerosis: Secondary | ICD-10-CM | POA: Diagnosis not present

## 2022-02-04 DIAGNOSIS — I6529 Occlusion and stenosis of unspecified carotid artery: Secondary | ICD-10-CM | POA: Diagnosis not present

## 2022-02-07 DIAGNOSIS — I714 Abdominal aortic aneurysm, without rupture, unspecified: Secondary | ICD-10-CM | POA: Diagnosis not present

## 2022-02-07 DIAGNOSIS — I6529 Occlusion and stenosis of unspecified carotid artery: Secondary | ICD-10-CM | POA: Diagnosis not present

## 2022-02-07 DIAGNOSIS — I672 Cerebral atherosclerosis: Secondary | ICD-10-CM | POA: Diagnosis not present

## 2022-02-07 DIAGNOSIS — I110 Hypertensive heart disease with heart failure: Secondary | ICD-10-CM | POA: Diagnosis not present

## 2022-02-07 DIAGNOSIS — I5030 Unspecified diastolic (congestive) heart failure: Secondary | ICD-10-CM | POA: Diagnosis not present

## 2022-02-07 DIAGNOSIS — I69318 Other symptoms and signs involving cognitive functions following cerebral infarction: Secondary | ICD-10-CM | POA: Diagnosis not present

## 2022-02-10 DIAGNOSIS — I69318 Other symptoms and signs involving cognitive functions following cerebral infarction: Secondary | ICD-10-CM | POA: Diagnosis not present

## 2022-02-10 DIAGNOSIS — I714 Abdominal aortic aneurysm, without rupture, unspecified: Secondary | ICD-10-CM | POA: Diagnosis not present

## 2022-02-10 DIAGNOSIS — I672 Cerebral atherosclerosis: Secondary | ICD-10-CM | POA: Diagnosis not present

## 2022-02-10 DIAGNOSIS — I5030 Unspecified diastolic (congestive) heart failure: Secondary | ICD-10-CM | POA: Diagnosis not present

## 2022-02-10 DIAGNOSIS — I110 Hypertensive heart disease with heart failure: Secondary | ICD-10-CM | POA: Diagnosis not present

## 2022-02-10 DIAGNOSIS — I6529 Occlusion and stenosis of unspecified carotid artery: Secondary | ICD-10-CM | POA: Diagnosis not present

## 2022-02-11 DIAGNOSIS — I672 Cerebral atherosclerosis: Secondary | ICD-10-CM | POA: Diagnosis not present

## 2022-02-11 DIAGNOSIS — I69318 Other symptoms and signs involving cognitive functions following cerebral infarction: Secondary | ICD-10-CM | POA: Diagnosis not present

## 2022-02-11 DIAGNOSIS — I714 Abdominal aortic aneurysm, without rupture, unspecified: Secondary | ICD-10-CM | POA: Diagnosis not present

## 2022-02-11 DIAGNOSIS — I5030 Unspecified diastolic (congestive) heart failure: Secondary | ICD-10-CM | POA: Diagnosis not present

## 2022-02-11 DIAGNOSIS — I6529 Occlusion and stenosis of unspecified carotid artery: Secondary | ICD-10-CM | POA: Diagnosis not present

## 2022-02-11 DIAGNOSIS — I110 Hypertensive heart disease with heart failure: Secondary | ICD-10-CM | POA: Diagnosis not present

## 2022-02-13 DIAGNOSIS — I672 Cerebral atherosclerosis: Secondary | ICD-10-CM | POA: Diagnosis not present

## 2022-02-13 DIAGNOSIS — I69318 Other symptoms and signs involving cognitive functions following cerebral infarction: Secondary | ICD-10-CM | POA: Diagnosis not present

## 2022-02-13 DIAGNOSIS — I6529 Occlusion and stenosis of unspecified carotid artery: Secondary | ICD-10-CM | POA: Diagnosis not present

## 2022-02-13 DIAGNOSIS — I110 Hypertensive heart disease with heart failure: Secondary | ICD-10-CM | POA: Diagnosis not present

## 2022-02-13 DIAGNOSIS — I5030 Unspecified diastolic (congestive) heart failure: Secondary | ICD-10-CM | POA: Diagnosis not present

## 2022-02-13 DIAGNOSIS — I714 Abdominal aortic aneurysm, without rupture, unspecified: Secondary | ICD-10-CM | POA: Diagnosis not present

## 2022-02-17 DIAGNOSIS — I714 Abdominal aortic aneurysm, without rupture, unspecified: Secondary | ICD-10-CM | POA: Diagnosis not present

## 2022-02-17 DIAGNOSIS — I6529 Occlusion and stenosis of unspecified carotid artery: Secondary | ICD-10-CM | POA: Diagnosis not present

## 2022-02-17 DIAGNOSIS — I5030 Unspecified diastolic (congestive) heart failure: Secondary | ICD-10-CM | POA: Diagnosis not present

## 2022-02-17 DIAGNOSIS — I110 Hypertensive heart disease with heart failure: Secondary | ICD-10-CM | POA: Diagnosis not present

## 2022-02-17 DIAGNOSIS — I69318 Other symptoms and signs involving cognitive functions following cerebral infarction: Secondary | ICD-10-CM | POA: Diagnosis not present

## 2022-02-17 DIAGNOSIS — I672 Cerebral atherosclerosis: Secondary | ICD-10-CM | POA: Diagnosis not present

## 2022-02-19 DIAGNOSIS — I672 Cerebral atherosclerosis: Secondary | ICD-10-CM | POA: Diagnosis not present

## 2022-02-19 DIAGNOSIS — I714 Abdominal aortic aneurysm, without rupture, unspecified: Secondary | ICD-10-CM | POA: Diagnosis not present

## 2022-02-19 DIAGNOSIS — I110 Hypertensive heart disease with heart failure: Secondary | ICD-10-CM | POA: Diagnosis not present

## 2022-02-19 DIAGNOSIS — I69318 Other symptoms and signs involving cognitive functions following cerebral infarction: Secondary | ICD-10-CM | POA: Diagnosis not present

## 2022-02-19 DIAGNOSIS — I6529 Occlusion and stenosis of unspecified carotid artery: Secondary | ICD-10-CM | POA: Diagnosis not present

## 2022-02-19 DIAGNOSIS — I5030 Unspecified diastolic (congestive) heart failure: Secondary | ICD-10-CM | POA: Diagnosis not present

## 2022-02-20 DIAGNOSIS — I6529 Occlusion and stenosis of unspecified carotid artery: Secondary | ICD-10-CM | POA: Diagnosis not present

## 2022-02-20 DIAGNOSIS — I672 Cerebral atherosclerosis: Secondary | ICD-10-CM | POA: Diagnosis not present

## 2022-02-20 DIAGNOSIS — I69318 Other symptoms and signs involving cognitive functions following cerebral infarction: Secondary | ICD-10-CM | POA: Diagnosis not present

## 2022-02-20 DIAGNOSIS — I110 Hypertensive heart disease with heart failure: Secondary | ICD-10-CM | POA: Diagnosis not present

## 2022-02-20 DIAGNOSIS — I5030 Unspecified diastolic (congestive) heart failure: Secondary | ICD-10-CM | POA: Diagnosis not present

## 2022-02-20 DIAGNOSIS — I714 Abdominal aortic aneurysm, without rupture, unspecified: Secondary | ICD-10-CM | POA: Diagnosis not present

## 2022-02-24 DIAGNOSIS — I110 Hypertensive heart disease with heart failure: Secondary | ICD-10-CM | POA: Diagnosis not present

## 2022-02-24 DIAGNOSIS — I6529 Occlusion and stenosis of unspecified carotid artery: Secondary | ICD-10-CM | POA: Diagnosis not present

## 2022-02-24 DIAGNOSIS — I714 Abdominal aortic aneurysm, without rupture, unspecified: Secondary | ICD-10-CM | POA: Diagnosis not present

## 2022-02-24 DIAGNOSIS — I5030 Unspecified diastolic (congestive) heart failure: Secondary | ICD-10-CM | POA: Diagnosis not present

## 2022-02-24 DIAGNOSIS — I69318 Other symptoms and signs involving cognitive functions following cerebral infarction: Secondary | ICD-10-CM | POA: Diagnosis not present

## 2022-02-24 DIAGNOSIS — I672 Cerebral atherosclerosis: Secondary | ICD-10-CM | POA: Diagnosis not present

## 2022-02-25 DIAGNOSIS — I110 Hypertensive heart disease with heart failure: Secondary | ICD-10-CM | POA: Diagnosis not present

## 2022-02-25 DIAGNOSIS — I672 Cerebral atherosclerosis: Secondary | ICD-10-CM | POA: Diagnosis not present

## 2022-02-25 DIAGNOSIS — I714 Abdominal aortic aneurysm, without rupture, unspecified: Secondary | ICD-10-CM | POA: Diagnosis not present

## 2022-02-25 DIAGNOSIS — I69318 Other symptoms and signs involving cognitive functions following cerebral infarction: Secondary | ICD-10-CM | POA: Diagnosis not present

## 2022-02-25 DIAGNOSIS — I6529 Occlusion and stenosis of unspecified carotid artery: Secondary | ICD-10-CM | POA: Diagnosis not present

## 2022-02-25 DIAGNOSIS — I5030 Unspecified diastolic (congestive) heart failure: Secondary | ICD-10-CM | POA: Diagnosis not present

## 2022-03-01 DIAGNOSIS — Z87891 Personal history of nicotine dependence: Secondary | ICD-10-CM | POA: Diagnosis not present

## 2022-03-01 DIAGNOSIS — I6529 Occlusion and stenosis of unspecified carotid artery: Secondary | ICD-10-CM | POA: Diagnosis not present

## 2022-03-01 DIAGNOSIS — H04129 Dry eye syndrome of unspecified lacrimal gland: Secondary | ICD-10-CM | POA: Diagnosis not present

## 2022-03-01 DIAGNOSIS — R630 Anorexia: Secondary | ICD-10-CM | POA: Diagnosis not present

## 2022-03-01 DIAGNOSIS — L853 Xerosis cutis: Secondary | ICD-10-CM | POA: Diagnosis not present

## 2022-03-01 DIAGNOSIS — E785 Hyperlipidemia, unspecified: Secondary | ICD-10-CM | POA: Diagnosis not present

## 2022-03-01 DIAGNOSIS — I672 Cerebral atherosclerosis: Secondary | ICD-10-CM | POA: Diagnosis not present

## 2022-03-01 DIAGNOSIS — J302 Other seasonal allergic rhinitis: Secondary | ICD-10-CM | POA: Diagnosis not present

## 2022-03-01 DIAGNOSIS — N400 Enlarged prostate without lower urinary tract symptoms: Secondary | ICD-10-CM | POA: Diagnosis not present

## 2022-03-01 DIAGNOSIS — E039 Hypothyroidism, unspecified: Secondary | ICD-10-CM | POA: Diagnosis not present

## 2022-03-01 DIAGNOSIS — I110 Hypertensive heart disease with heart failure: Secondary | ICD-10-CM | POA: Diagnosis not present

## 2022-03-01 DIAGNOSIS — I5030 Unspecified diastolic (congestive) heart failure: Secondary | ICD-10-CM | POA: Diagnosis not present

## 2022-03-01 DIAGNOSIS — I69318 Other symptoms and signs involving cognitive functions following cerebral infarction: Secondary | ICD-10-CM | POA: Diagnosis not present

## 2022-03-01 DIAGNOSIS — I714 Abdominal aortic aneurysm, without rupture, unspecified: Secondary | ICD-10-CM | POA: Diagnosis not present

## 2022-03-04 DIAGNOSIS — I69318 Other symptoms and signs involving cognitive functions following cerebral infarction: Secondary | ICD-10-CM | POA: Diagnosis not present

## 2022-03-04 DIAGNOSIS — I110 Hypertensive heart disease with heart failure: Secondary | ICD-10-CM | POA: Diagnosis not present

## 2022-03-04 DIAGNOSIS — I6529 Occlusion and stenosis of unspecified carotid artery: Secondary | ICD-10-CM | POA: Diagnosis not present

## 2022-03-04 DIAGNOSIS — I5030 Unspecified diastolic (congestive) heart failure: Secondary | ICD-10-CM | POA: Diagnosis not present

## 2022-03-04 DIAGNOSIS — I714 Abdominal aortic aneurysm, without rupture, unspecified: Secondary | ICD-10-CM | POA: Diagnosis not present

## 2022-03-04 DIAGNOSIS — I672 Cerebral atherosclerosis: Secondary | ICD-10-CM | POA: Diagnosis not present

## 2022-03-05 DIAGNOSIS — I5030 Unspecified diastolic (congestive) heart failure: Secondary | ICD-10-CM | POA: Diagnosis not present

## 2022-03-05 DIAGNOSIS — I69318 Other symptoms and signs involving cognitive functions following cerebral infarction: Secondary | ICD-10-CM | POA: Diagnosis not present

## 2022-03-05 DIAGNOSIS — I714 Abdominal aortic aneurysm, without rupture, unspecified: Secondary | ICD-10-CM | POA: Diagnosis not present

## 2022-03-05 DIAGNOSIS — I672 Cerebral atherosclerosis: Secondary | ICD-10-CM | POA: Diagnosis not present

## 2022-03-05 DIAGNOSIS — I6529 Occlusion and stenosis of unspecified carotid artery: Secondary | ICD-10-CM | POA: Diagnosis not present

## 2022-03-05 DIAGNOSIS — I110 Hypertensive heart disease with heart failure: Secondary | ICD-10-CM | POA: Diagnosis not present

## 2022-03-06 DIAGNOSIS — I714 Abdominal aortic aneurysm, without rupture, unspecified: Secondary | ICD-10-CM | POA: Diagnosis not present

## 2022-03-06 DIAGNOSIS — I110 Hypertensive heart disease with heart failure: Secondary | ICD-10-CM | POA: Diagnosis not present

## 2022-03-06 DIAGNOSIS — I672 Cerebral atherosclerosis: Secondary | ICD-10-CM | POA: Diagnosis not present

## 2022-03-06 DIAGNOSIS — I6529 Occlusion and stenosis of unspecified carotid artery: Secondary | ICD-10-CM | POA: Diagnosis not present

## 2022-03-06 DIAGNOSIS — I69318 Other symptoms and signs involving cognitive functions following cerebral infarction: Secondary | ICD-10-CM | POA: Diagnosis not present

## 2022-03-06 DIAGNOSIS — I5030 Unspecified diastolic (congestive) heart failure: Secondary | ICD-10-CM | POA: Diagnosis not present

## 2022-03-11 DIAGNOSIS — I672 Cerebral atherosclerosis: Secondary | ICD-10-CM | POA: Diagnosis not present

## 2022-03-11 DIAGNOSIS — I69318 Other symptoms and signs involving cognitive functions following cerebral infarction: Secondary | ICD-10-CM | POA: Diagnosis not present

## 2022-03-11 DIAGNOSIS — I110 Hypertensive heart disease with heart failure: Secondary | ICD-10-CM | POA: Diagnosis not present

## 2022-03-11 DIAGNOSIS — I6529 Occlusion and stenosis of unspecified carotid artery: Secondary | ICD-10-CM | POA: Diagnosis not present

## 2022-03-11 DIAGNOSIS — I5030 Unspecified diastolic (congestive) heart failure: Secondary | ICD-10-CM | POA: Diagnosis not present

## 2022-03-11 DIAGNOSIS — I714 Abdominal aortic aneurysm, without rupture, unspecified: Secondary | ICD-10-CM | POA: Diagnosis not present

## 2022-03-12 ENCOUNTER — Telehealth: Payer: Self-pay | Admitting: Family Medicine

## 2022-03-12 NOTE — Telephone Encounter (Signed)
[facility], called and wanted to inform you that Rosanne Sack MD would be the attending for pt, states that veronica had called regarding this change, also states if you  wanted the form change call medical records for that to be sent over, Her name was claudia and [phone]

## 2022-03-13 DIAGNOSIS — I714 Abdominal aortic aneurysm, without rupture, unspecified: Secondary | ICD-10-CM | POA: Diagnosis not present

## 2022-03-13 DIAGNOSIS — I6529 Occlusion and stenosis of unspecified carotid artery: Secondary | ICD-10-CM | POA: Diagnosis not present

## 2022-03-13 DIAGNOSIS — I110 Hypertensive heart disease with heart failure: Secondary | ICD-10-CM | POA: Diagnosis not present

## 2022-03-13 DIAGNOSIS — I672 Cerebral atherosclerosis: Secondary | ICD-10-CM | POA: Diagnosis not present

## 2022-03-13 DIAGNOSIS — I69318 Other symptoms and signs involving cognitive functions following cerebral infarction: Secondary | ICD-10-CM | POA: Diagnosis not present

## 2022-03-13 DIAGNOSIS — I5030 Unspecified diastolic (congestive) heart failure: Secondary | ICD-10-CM | POA: Diagnosis not present

## 2022-03-14 DIAGNOSIS — I714 Abdominal aortic aneurysm, without rupture, unspecified: Secondary | ICD-10-CM | POA: Diagnosis not present

## 2022-03-14 DIAGNOSIS — I110 Hypertensive heart disease with heart failure: Secondary | ICD-10-CM | POA: Diagnosis not present

## 2022-03-14 DIAGNOSIS — I5030 Unspecified diastolic (congestive) heart failure: Secondary | ICD-10-CM | POA: Diagnosis not present

## 2022-03-14 DIAGNOSIS — I672 Cerebral atherosclerosis: Secondary | ICD-10-CM | POA: Diagnosis not present

## 2022-03-14 DIAGNOSIS — I6529 Occlusion and stenosis of unspecified carotid artery: Secondary | ICD-10-CM | POA: Diagnosis not present

## 2022-03-14 DIAGNOSIS — I69318 Other symptoms and signs involving cognitive functions following cerebral infarction: Secondary | ICD-10-CM | POA: Diagnosis not present

## 2022-03-15 DIAGNOSIS — I672 Cerebral atherosclerosis: Secondary | ICD-10-CM | POA: Diagnosis not present

## 2022-03-15 DIAGNOSIS — I69318 Other symptoms and signs involving cognitive functions following cerebral infarction: Secondary | ICD-10-CM | POA: Diagnosis not present

## 2022-03-15 DIAGNOSIS — I6529 Occlusion and stenosis of unspecified carotid artery: Secondary | ICD-10-CM | POA: Diagnosis not present

## 2022-03-15 DIAGNOSIS — I110 Hypertensive heart disease with heart failure: Secondary | ICD-10-CM | POA: Diagnosis not present

## 2022-03-15 DIAGNOSIS — I714 Abdominal aortic aneurysm, without rupture, unspecified: Secondary | ICD-10-CM | POA: Diagnosis not present

## 2022-03-15 DIAGNOSIS — I5030 Unspecified diastolic (congestive) heart failure: Secondary | ICD-10-CM | POA: Diagnosis not present

## 2022-03-16 DIAGNOSIS — I672 Cerebral atherosclerosis: Secondary | ICD-10-CM | POA: Diagnosis not present

## 2022-03-16 DIAGNOSIS — I714 Abdominal aortic aneurysm, without rupture, unspecified: Secondary | ICD-10-CM | POA: Diagnosis not present

## 2022-03-16 DIAGNOSIS — I69318 Other symptoms and signs involving cognitive functions following cerebral infarction: Secondary | ICD-10-CM | POA: Diagnosis not present

## 2022-03-16 DIAGNOSIS — I5030 Unspecified diastolic (congestive) heart failure: Secondary | ICD-10-CM | POA: Diagnosis not present

## 2022-03-16 DIAGNOSIS — I110 Hypertensive heart disease with heart failure: Secondary | ICD-10-CM | POA: Diagnosis not present

## 2022-03-16 DIAGNOSIS — I6529 Occlusion and stenosis of unspecified carotid artery: Secondary | ICD-10-CM | POA: Diagnosis not present

## 2022-03-17 DIAGNOSIS — I5030 Unspecified diastolic (congestive) heart failure: Secondary | ICD-10-CM | POA: Diagnosis not present

## 2022-03-17 DIAGNOSIS — I6529 Occlusion and stenosis of unspecified carotid artery: Secondary | ICD-10-CM | POA: Diagnosis not present

## 2022-03-17 DIAGNOSIS — I672 Cerebral atherosclerosis: Secondary | ICD-10-CM | POA: Diagnosis not present

## 2022-03-17 DIAGNOSIS — I714 Abdominal aortic aneurysm, without rupture, unspecified: Secondary | ICD-10-CM | POA: Diagnosis not present

## 2022-03-17 DIAGNOSIS — I69318 Other symptoms and signs involving cognitive functions following cerebral infarction: Secondary | ICD-10-CM | POA: Diagnosis not present

## 2022-03-17 DIAGNOSIS — I110 Hypertensive heart disease with heart failure: Secondary | ICD-10-CM | POA: Diagnosis not present

## 2022-03-18 DIAGNOSIS — I714 Abdominal aortic aneurysm, without rupture, unspecified: Secondary | ICD-10-CM | POA: Diagnosis not present

## 2022-03-18 DIAGNOSIS — I6529 Occlusion and stenosis of unspecified carotid artery: Secondary | ICD-10-CM | POA: Diagnosis not present

## 2022-03-18 DIAGNOSIS — I5030 Unspecified diastolic (congestive) heart failure: Secondary | ICD-10-CM | POA: Diagnosis not present

## 2022-03-18 DIAGNOSIS — I110 Hypertensive heart disease with heart failure: Secondary | ICD-10-CM | POA: Diagnosis not present

## 2022-03-18 DIAGNOSIS — I672 Cerebral atherosclerosis: Secondary | ICD-10-CM | POA: Diagnosis not present

## 2022-03-18 DIAGNOSIS — I69318 Other symptoms and signs involving cognitive functions following cerebral infarction: Secondary | ICD-10-CM | POA: Diagnosis not present

## 2022-03-19 DIAGNOSIS — I6529 Occlusion and stenosis of unspecified carotid artery: Secondary | ICD-10-CM | POA: Diagnosis not present

## 2022-03-19 DIAGNOSIS — I5030 Unspecified diastolic (congestive) heart failure: Secondary | ICD-10-CM | POA: Diagnosis not present

## 2022-03-19 DIAGNOSIS — I110 Hypertensive heart disease with heart failure: Secondary | ICD-10-CM | POA: Diagnosis not present

## 2022-03-19 DIAGNOSIS — I672 Cerebral atherosclerosis: Secondary | ICD-10-CM | POA: Diagnosis not present

## 2022-03-19 DIAGNOSIS — I714 Abdominal aortic aneurysm, without rupture, unspecified: Secondary | ICD-10-CM | POA: Diagnosis not present

## 2022-03-19 DIAGNOSIS — I69318 Other symptoms and signs involving cognitive functions following cerebral infarction: Secondary | ICD-10-CM | POA: Diagnosis not present

## 2022-03-20 DIAGNOSIS — I6529 Occlusion and stenosis of unspecified carotid artery: Secondary | ICD-10-CM | POA: Diagnosis not present

## 2022-03-20 DIAGNOSIS — I714 Abdominal aortic aneurysm, without rupture, unspecified: Secondary | ICD-10-CM | POA: Diagnosis not present

## 2022-03-20 DIAGNOSIS — I672 Cerebral atherosclerosis: Secondary | ICD-10-CM | POA: Diagnosis not present

## 2022-03-20 DIAGNOSIS — I5030 Unspecified diastolic (congestive) heart failure: Secondary | ICD-10-CM | POA: Diagnosis not present

## 2022-03-20 DIAGNOSIS — I110 Hypertensive heart disease with heart failure: Secondary | ICD-10-CM | POA: Diagnosis not present

## 2022-03-20 DIAGNOSIS — I69318 Other symptoms and signs involving cognitive functions following cerebral infarction: Secondary | ICD-10-CM | POA: Diagnosis not present

## 2022-03-24 ENCOUNTER — Telehealth: Payer: Self-pay | Admitting: Family Medicine

## 2022-03-24 NOTE — Telephone Encounter (Signed)
Sympathy card sent to family.

## 2022-03-25 ENCOUNTER — Telehealth: Payer: Self-pay | Admitting: Family Medicine

## 2022-03-25 NOTE — Telephone Encounter (Signed)
Please verify/document that this has been completed and then close encounter.  Thanks

## 2022-03-25 NOTE — Telephone Encounter (Signed)
Chanell called in and says she is waiting on a signature in the [place] system for his death certificate.

## 2022-03-26 ENCOUNTER — Telehealth: Payer: Self-pay | Admitting: Family Medicine

## 2022-03-26 NOTE — Telephone Encounter (Signed)
[facility] and gave them the name Rosanne Sack, MD which was the name of the attending given to me by [facility] [place]. Her number is [phone] and [facility] # is [phone].

## 2022-03-26 NOTE — Telephone Encounter (Signed)
Veronica--I believe you have this information.  Please get this taken care of.

## 2022-03-26 NOTE — Telephone Encounter (Signed)
Called and informed Danny Frederick, and she states [employer] told her that you were his pcp and if you have the name of the pcp at [facility] that would be helpful for them.

## 2022-04-01 DEATH — deceased
